# Patient Record
Sex: Female | Born: 1945 | Race: Black or African American | Hispanic: No | Marital: Married | Smoking: Never smoker
Health system: Southern US, Community
[De-identification: ages and names within clinical notes are randomized; demographics above are authoritative.]

## PROBLEM LIST (undated history)

## (undated) DIAGNOSIS — K648 Other hemorrhoids: Secondary | ICD-10-CM

## (undated) DIAGNOSIS — I639 Cerebral infarction, unspecified: Secondary | ICD-10-CM

## (undated) DIAGNOSIS — Z85038 Personal history of other malignant neoplasm of large intestine: Secondary | ICD-10-CM

## (undated) DIAGNOSIS — K5790 Diverticulosis of intestine, part unspecified, without perforation or abscess without bleeding: Secondary | ICD-10-CM

## (undated) DIAGNOSIS — M1990 Unspecified osteoarthritis, unspecified site: Secondary | ICD-10-CM

## (undated) DIAGNOSIS — I099 Rheumatic heart disease, unspecified: Secondary | ICD-10-CM

## (undated) DIAGNOSIS — I745 Embolism and thrombosis of iliac artery: Secondary | ICD-10-CM

## (undated) DIAGNOSIS — K625 Hemorrhage of anus and rectum: Secondary | ICD-10-CM

## (undated) DIAGNOSIS — R413 Other amnesia: Secondary | ICD-10-CM

## (undated) DIAGNOSIS — I4891 Unspecified atrial fibrillation: Secondary | ICD-10-CM

## (undated) DIAGNOSIS — I5022 Chronic systolic (congestive) heart failure: Secondary | ICD-10-CM

## (undated) DIAGNOSIS — I4901 Ventricular fibrillation: Secondary | ICD-10-CM

## (undated) DIAGNOSIS — R269 Unspecified abnormalities of gait and mobility: Secondary | ICD-10-CM

## (undated) DIAGNOSIS — I495 Sick sinus syndrome: Secondary | ICD-10-CM

## (undated) DIAGNOSIS — I82409 Acute embolism and thrombosis of unspecified deep veins of unspecified lower extremity: Secondary | ICD-10-CM

## (undated) DIAGNOSIS — I428 Other cardiomyopathies: Secondary | ICD-10-CM

## (undated) HISTORY — DX: Other hemorrhoids: K64.8

## (undated) HISTORY — DX: Personal history of other malignant neoplasm of large intestine: Z85.038

## (undated) HISTORY — DX: Other amnesia: R41.3

## (undated) HISTORY — PX: OTHER SURGICAL HISTORY: SHX169

## (undated) HISTORY — DX: Diverticulosis of intestine, part unspecified, without perforation or abscess without bleeding: K57.90

## (undated) HISTORY — PX: ABDOMINAL HYSTERECTOMY: SHX81

## (undated) HISTORY — DX: Acute embolism and thrombosis of unspecified deep veins of unspecified lower extremity: I82.409

## (undated) HISTORY — DX: Cerebral infarction, unspecified: I63.9

## (undated) HISTORY — DX: Unspecified abnormalities of gait and mobility: R26.9

## (undated) HISTORY — DX: Unspecified atrial fibrillation: I48.91

---

## 1981-08-26 HISTORY — PX: OTHER SURGICAL HISTORY: SHX169

## 1997-08-26 HISTORY — PX: COLON SURGERY: SHX602

## 1997-12-27 ENCOUNTER — Inpatient Hospital Stay (HOSPITAL_COMMUNITY): Admission: EM | Admit: 1997-12-27 | Discharge: 1997-12-31 | Payer: Self-pay | Admitting: Emergency Medicine

## 1998-01-12 ENCOUNTER — Ambulatory Visit (HOSPITAL_COMMUNITY): Admission: RE | Admit: 1998-01-12 | Discharge: 1998-01-12 | Payer: Self-pay | Admitting: Cardiology

## 1998-04-17 ENCOUNTER — Inpatient Hospital Stay (HOSPITAL_COMMUNITY): Admission: EM | Admit: 1998-04-17 | Discharge: 1998-05-03 | Payer: Self-pay | Admitting: *Deleted

## 1998-04-18 ENCOUNTER — Encounter: Payer: Self-pay | Admitting: Gastroenterology

## 1998-04-26 ENCOUNTER — Encounter: Payer: Self-pay | Admitting: Gastroenterology

## 1998-05-30 ENCOUNTER — Observation Stay (HOSPITAL_COMMUNITY): Admission: EM | Admit: 1998-05-30 | Discharge: 1998-05-31 | Payer: Self-pay | Admitting: *Deleted

## 1998-05-31 ENCOUNTER — Encounter: Payer: Self-pay | Admitting: Gastroenterology

## 1999-04-20 ENCOUNTER — Ambulatory Visit (HOSPITAL_COMMUNITY): Admission: RE | Admit: 1999-04-20 | Discharge: 1999-04-20 | Payer: Self-pay | Admitting: *Deleted

## 1999-08-21 ENCOUNTER — Encounter: Admission: RE | Admit: 1999-08-21 | Discharge: 1999-08-21 | Payer: Self-pay | Admitting: Gynecology

## 1999-10-25 ENCOUNTER — Encounter: Admission: RE | Admit: 1999-10-25 | Discharge: 1999-10-25 | Payer: Self-pay | Admitting: Gynecology

## 1999-10-25 ENCOUNTER — Encounter: Payer: Self-pay | Admitting: Gynecology

## 2000-04-29 ENCOUNTER — Encounter (INDEPENDENT_AMBULATORY_CARE_PROVIDER_SITE_OTHER): Payer: Self-pay

## 2000-04-29 ENCOUNTER — Encounter: Payer: Self-pay | Admitting: Gastroenterology

## 2000-04-29 ENCOUNTER — Encounter (INDEPENDENT_AMBULATORY_CARE_PROVIDER_SITE_OTHER): Payer: Self-pay | Admitting: *Deleted

## 2000-04-29 ENCOUNTER — Ambulatory Visit (HOSPITAL_COMMUNITY): Admission: RE | Admit: 2000-04-29 | Discharge: 2000-04-29 | Payer: Self-pay | Admitting: *Deleted

## 2000-11-24 ENCOUNTER — Encounter: Admission: RE | Admit: 2000-11-24 | Discharge: 2000-11-24 | Payer: Self-pay | Admitting: Gynecology

## 2000-11-24 ENCOUNTER — Encounter: Payer: Self-pay | Admitting: Gynecology

## 2001-06-08 ENCOUNTER — Ambulatory Visit (HOSPITAL_COMMUNITY): Admission: RE | Admit: 2001-06-08 | Discharge: 2001-06-08 | Payer: Self-pay | Admitting: Oncology

## 2001-06-08 ENCOUNTER — Encounter (HOSPITAL_COMMUNITY): Payer: Self-pay | Admitting: Oncology

## 2001-06-10 ENCOUNTER — Encounter (HOSPITAL_COMMUNITY): Payer: Self-pay | Admitting: Oncology

## 2001-06-10 ENCOUNTER — Ambulatory Visit (HOSPITAL_COMMUNITY): Admission: RE | Admit: 2001-06-10 | Discharge: 2001-06-10 | Payer: Self-pay | Admitting: Oncology

## 2001-06-17 ENCOUNTER — Other Ambulatory Visit: Admission: RE | Admit: 2001-06-17 | Discharge: 2001-06-17 | Payer: Self-pay | Admitting: Gynecology

## 2001-12-02 ENCOUNTER — Encounter: Admission: RE | Admit: 2001-12-02 | Discharge: 2001-12-02 | Payer: Self-pay | Admitting: Gynecology

## 2001-12-02 ENCOUNTER — Encounter: Payer: Self-pay | Admitting: Gynecology

## 2001-12-20 ENCOUNTER — Inpatient Hospital Stay (HOSPITAL_COMMUNITY): Admission: EM | Admit: 2001-12-20 | Discharge: 2002-01-05 | Payer: Self-pay

## 2001-12-20 ENCOUNTER — Encounter (INDEPENDENT_AMBULATORY_CARE_PROVIDER_SITE_OTHER): Payer: Self-pay | Admitting: Specialist

## 2001-12-23 ENCOUNTER — Encounter: Payer: Self-pay | Admitting: Cardiology

## 2002-04-19 ENCOUNTER — Encounter: Payer: Self-pay | Admitting: Gastroenterology

## 2002-04-19 ENCOUNTER — Ambulatory Visit (HOSPITAL_COMMUNITY): Admission: RE | Admit: 2002-04-19 | Discharge: 2002-04-19 | Payer: Self-pay | Admitting: *Deleted

## 2002-06-07 ENCOUNTER — Encounter (HOSPITAL_COMMUNITY): Payer: Self-pay | Admitting: Oncology

## 2002-06-07 ENCOUNTER — Ambulatory Visit (HOSPITAL_COMMUNITY): Admission: RE | Admit: 2002-06-07 | Discharge: 2002-06-07 | Payer: Self-pay | Admitting: Oncology

## 2002-12-10 ENCOUNTER — Encounter: Admission: RE | Admit: 2002-12-10 | Discharge: 2002-12-10 | Payer: Self-pay | Admitting: Gynecology

## 2002-12-10 ENCOUNTER — Encounter: Payer: Self-pay | Admitting: Gynecology

## 2003-06-06 ENCOUNTER — Ambulatory Visit (HOSPITAL_COMMUNITY): Admission: RE | Admit: 2003-06-06 | Discharge: 2003-06-06 | Payer: Self-pay | Admitting: Oncology

## 2003-06-06 ENCOUNTER — Encounter (HOSPITAL_COMMUNITY): Payer: Self-pay | Admitting: Oncology

## 2003-07-27 ENCOUNTER — Emergency Department (HOSPITAL_COMMUNITY): Admission: AD | Admit: 2003-07-27 | Discharge: 2003-07-27 | Payer: Self-pay | Admitting: Family Medicine

## 2003-07-29 ENCOUNTER — Encounter: Admission: RE | Admit: 2003-07-29 | Discharge: 2003-09-09 | Payer: Self-pay | Admitting: Family Medicine

## 2003-08-09 ENCOUNTER — Emergency Department (HOSPITAL_COMMUNITY): Admission: AD | Admit: 2003-08-09 | Discharge: 2003-08-09 | Payer: Self-pay | Admitting: Family Medicine

## 2003-10-04 ENCOUNTER — Ambulatory Visit (HOSPITAL_COMMUNITY): Admission: RE | Admit: 2003-10-04 | Discharge: 2003-10-04 | Payer: Self-pay | Admitting: Cardiology

## 2003-12-12 ENCOUNTER — Encounter: Admission: RE | Admit: 2003-12-12 | Discharge: 2003-12-12 | Payer: Self-pay | Admitting: Gynecology

## 2004-05-02 ENCOUNTER — Ambulatory Visit (HOSPITAL_COMMUNITY): Admission: RE | Admit: 2004-05-02 | Discharge: 2004-05-02 | Payer: Self-pay | Admitting: *Deleted

## 2004-05-02 ENCOUNTER — Encounter: Payer: Self-pay | Admitting: Gastroenterology

## 2004-05-02 ENCOUNTER — Encounter (INDEPENDENT_AMBULATORY_CARE_PROVIDER_SITE_OTHER): Payer: Self-pay | Admitting: *Deleted

## 2004-08-15 ENCOUNTER — Other Ambulatory Visit: Admission: RE | Admit: 2004-08-15 | Discharge: 2004-08-15 | Payer: Self-pay | Admitting: Gynecology

## 2004-10-03 ENCOUNTER — Ambulatory Visit: Payer: Self-pay | Admitting: Oncology

## 2004-10-04 ENCOUNTER — Ambulatory Visit (HOSPITAL_COMMUNITY): Admission: RE | Admit: 2004-10-04 | Discharge: 2004-10-04 | Payer: Self-pay | Admitting: Oncology

## 2005-02-13 ENCOUNTER — Encounter: Admission: RE | Admit: 2005-02-13 | Discharge: 2005-02-13 | Payer: Self-pay | Admitting: Gynecology

## 2005-09-12 ENCOUNTER — Emergency Department (HOSPITAL_COMMUNITY): Admission: EM | Admit: 2005-09-12 | Discharge: 2005-09-12 | Payer: Self-pay | Admitting: Family Medicine

## 2005-09-18 ENCOUNTER — Emergency Department (HOSPITAL_COMMUNITY): Admission: EM | Admit: 2005-09-18 | Discharge: 2005-09-18 | Payer: Self-pay | Admitting: Family Medicine

## 2005-10-02 ENCOUNTER — Ambulatory Visit: Payer: Self-pay | Admitting: Oncology

## 2005-10-03 ENCOUNTER — Ambulatory Visit (HOSPITAL_COMMUNITY): Admission: RE | Admit: 2005-10-03 | Discharge: 2005-10-03 | Payer: Self-pay | Admitting: Oncology

## 2006-02-14 ENCOUNTER — Encounter: Admission: RE | Admit: 2006-02-14 | Discharge: 2006-02-14 | Payer: Self-pay | Admitting: Gynecology

## 2006-03-13 ENCOUNTER — Encounter: Payer: Self-pay | Admitting: Internal Medicine

## 2006-03-13 ENCOUNTER — Ambulatory Visit (HOSPITAL_COMMUNITY): Admission: RE | Admit: 2006-03-13 | Discharge: 2006-03-13 | Payer: Self-pay | Admitting: Cardiology

## 2006-03-13 ENCOUNTER — Ambulatory Visit: Payer: Self-pay | Admitting: Internal Medicine

## 2006-09-30 ENCOUNTER — Ambulatory Visit: Payer: Self-pay | Admitting: Oncology

## 2006-10-02 LAB — COMPREHENSIVE METABOLIC PANEL
ALT: 15 U/L (ref 0–35)
AST: 17 U/L (ref 0–37)
Albumin: 4.7 g/dL (ref 3.5–5.2)
Alkaline Phosphatase: 66 U/L (ref 39–117)
BUN: 27 mg/dL — ABNORMAL HIGH (ref 6–23)
CO2: 24 mEq/L (ref 19–32)
Calcium: 9.6 mg/dL (ref 8.4–10.5)
Chloride: 101 mEq/L (ref 96–112)
Creatinine, Ser: 0.92 mg/dL (ref 0.40–1.20)
Glucose, Bld: 86 mg/dL (ref 70–99)
Potassium: 3.8 mEq/L (ref 3.5–5.3)
Sodium: 140 mEq/L (ref 135–145)
Total Bilirubin: 0.8 mg/dL (ref 0.3–1.2)
Total Protein: 7.6 g/dL (ref 6.0–8.3)

## 2006-10-02 LAB — CBC WITH DIFFERENTIAL/PLATELET
BASO%: 0.5 % (ref 0.0–2.0)
Basophils Absolute: 0 10*3/uL (ref 0.0–0.1)
EOS%: 0.8 % (ref 0.0–7.0)
Eosinophils Absolute: 0 10*3/uL (ref 0.0–0.5)
HCT: 44.2 % (ref 34.8–46.6)
HGB: 15.4 g/dL (ref 11.6–15.9)
LYMPH%: 37.5 % (ref 14.0–48.0)
MCH: 31.5 pg (ref 26.0–34.0)
MCHC: 34.8 g/dL (ref 32.0–36.0)
MCV: 90.5 fL (ref 81.0–101.0)
MONO#: 0.2 10*3/uL (ref 0.1–0.9)
MONO%: 6 % (ref 0.0–13.0)
NEUT#: 1.9 10*3/uL (ref 1.5–6.5)
NEUT%: 55.2 % (ref 39.6–76.8)
Platelets: 148 10*3/uL (ref 145–400)
RBC: 4.88 10*6/uL (ref 3.70–5.32)
RDW: 14.3 % (ref 11.3–14.5)
WBC: 3.4 10*3/uL — ABNORMAL LOW (ref 3.9–10.0)
lymph#: 1.3 10*3/uL (ref 0.9–3.3)

## 2006-10-02 LAB — LACTATE DEHYDROGENASE: LDH: 230 U/L (ref 94–250)

## 2007-02-18 ENCOUNTER — Encounter: Admission: RE | Admit: 2007-02-18 | Discharge: 2007-02-18 | Payer: Self-pay | Admitting: Gynecology

## 2007-07-09 ENCOUNTER — Other Ambulatory Visit: Admission: RE | Admit: 2007-07-09 | Discharge: 2007-07-09 | Payer: Self-pay | Admitting: Gynecology

## 2007-09-29 ENCOUNTER — Ambulatory Visit: Payer: Self-pay | Admitting: Oncology

## 2007-10-01 LAB — COMPREHENSIVE METABOLIC PANEL
ALT: 15 U/L (ref 0–35)
AST: 17 U/L (ref 0–37)
Albumin: 4.3 g/dL (ref 3.5–5.2)
Alkaline Phosphatase: 59 U/L (ref 39–117)
BUN: 17 mg/dL (ref 6–23)
CO2: 27 mEq/L (ref 19–32)
Calcium: 9.3 mg/dL (ref 8.4–10.5)
Chloride: 104 mEq/L (ref 96–112)
Creatinine, Ser: 0.9 mg/dL (ref 0.40–1.20)
Glucose, Bld: 113 mg/dL — ABNORMAL HIGH (ref 70–99)
Potassium: 3.9 mEq/L (ref 3.5–5.3)
Sodium: 143 mEq/L (ref 135–145)
Total Bilirubin: 1.2 mg/dL (ref 0.3–1.2)
Total Protein: 7.5 g/dL (ref 6.0–8.3)

## 2007-10-01 LAB — CBC WITH DIFFERENTIAL/PLATELET
BASO%: 1 % (ref 0.0–2.0)
Basophils Absolute: 0.1 10*3/uL (ref 0.0–0.1)
EOS%: 0.2 % (ref 0.0–7.0)
Eosinophils Absolute: 0 10*3/uL (ref 0.0–0.5)
HCT: 43.9 % (ref 34.8–46.6)
HGB: 14.9 g/dL (ref 11.6–15.9)
LYMPH%: 18.1 % (ref 14.0–48.0)
MCH: 30.5 pg (ref 26.0–34.0)
MCHC: 33.8 g/dL (ref 32.0–36.0)
MCV: 90.2 fL (ref 81.0–101.0)
MONO#: 0.5 10*3/uL (ref 0.1–0.9)
MONO%: 8.7 % (ref 0.0–13.0)
NEUT#: 3.9 10*3/uL (ref 1.5–6.5)
NEUT%: 72 % (ref 39.6–76.8)
Platelets: 157 10*3/uL (ref 145–400)
RBC: 4.87 10*6/uL (ref 3.70–5.32)
RDW: 15 % — ABNORMAL HIGH (ref 11.3–14.5)
WBC: 5.5 10*3/uL (ref 3.9–10.0)
lymph#: 1 10*3/uL (ref 0.9–3.3)

## 2007-10-01 LAB — LACTATE DEHYDROGENASE: LDH: 210 U/L (ref 94–250)

## 2007-10-05 ENCOUNTER — Encounter: Admission: RE | Admit: 2007-10-05 | Discharge: 2007-10-05 | Payer: Self-pay | Admitting: Internal Medicine

## 2008-01-24 ENCOUNTER — Ambulatory Visit: Payer: Self-pay | Admitting: Cardiology

## 2008-01-24 ENCOUNTER — Inpatient Hospital Stay (HOSPITAL_COMMUNITY): Admission: EM | Admit: 2008-01-24 | Discharge: 2008-02-17 | Payer: Self-pay | Admitting: Emergency Medicine

## 2008-01-24 ENCOUNTER — Ambulatory Visit: Payer: Self-pay | Admitting: Pulmonary Disease

## 2008-02-01 ENCOUNTER — Encounter (INDEPENDENT_AMBULATORY_CARE_PROVIDER_SITE_OTHER): Payer: Self-pay | Admitting: Cardiology

## 2008-03-03 ENCOUNTER — Ambulatory Visit: Payer: Self-pay

## 2008-03-07 ENCOUNTER — Ambulatory Visit: Payer: Self-pay | Admitting: Internal Medicine

## 2008-05-27 ENCOUNTER — Encounter: Admission: RE | Admit: 2008-05-27 | Discharge: 2008-05-27 | Payer: Self-pay | Admitting: Gynecology

## 2008-06-07 ENCOUNTER — Ambulatory Visit: Payer: Self-pay | Admitting: Internal Medicine

## 2008-09-05 ENCOUNTER — Ambulatory Visit: Payer: Self-pay | Admitting: Internal Medicine

## 2008-09-27 ENCOUNTER — Ambulatory Visit: Payer: Self-pay | Admitting: Oncology

## 2008-09-29 LAB — CBC WITH DIFFERENTIAL/PLATELET
BASO%: 0.3 % (ref 0.0–2.0)
Basophils Absolute: 0 10*3/uL (ref 0.0–0.1)
EOS%: 0.9 % (ref 0.0–7.0)
Eosinophils Absolute: 0 10*3/uL (ref 0.0–0.5)
HCT: 41.7 % (ref 34.8–46.6)
HGB: 14.1 g/dL (ref 11.6–15.9)
LYMPH%: 27.6 % (ref 14.0–48.0)
MCH: 30.3 pg (ref 26.0–34.0)
MCHC: 33.8 g/dL (ref 32.0–36.0)
MCV: 89.9 fL (ref 81.0–101.0)
MONO#: 0.3 10*3/uL (ref 0.1–0.9)
MONO%: 8.5 % (ref 0.0–13.0)
NEUT#: 1.9 10*3/uL (ref 1.5–6.5)
NEUT%: 62.7 % (ref 39.6–76.8)
Platelets: 134 10*3/uL — ABNORMAL LOW (ref 145–400)
RBC: 4.64 10*6/uL (ref 3.70–5.32)
RDW: 16.2 % — ABNORMAL HIGH (ref 11.3–14.5)
WBC: 3 10*3/uL — ABNORMAL LOW (ref 3.9–10.0)
lymph#: 0.8 10*3/uL — ABNORMAL LOW (ref 0.9–3.3)

## 2008-09-29 LAB — COMPREHENSIVE METABOLIC PANEL
ALT: 11 U/L (ref 0–35)
AST: 15 U/L (ref 0–37)
Albumin: 4.3 g/dL (ref 3.5–5.2)
Alkaline Phosphatase: 71 U/L (ref 39–117)
BUN: 19 mg/dL (ref 6–23)
CO2: 27 mEq/L (ref 19–32)
Calcium: 9.2 mg/dL (ref 8.4–10.5)
Chloride: 104 mEq/L (ref 96–112)
Creatinine, Ser: 0.76 mg/dL (ref 0.40–1.20)
Glucose, Bld: 66 mg/dL — ABNORMAL LOW (ref 70–99)
Potassium: 3.8 mEq/L (ref 3.5–5.3)
Sodium: 139 mEq/L (ref 135–145)
Total Bilirubin: 0.7 mg/dL (ref 0.3–1.2)
Total Protein: 7.2 g/dL (ref 6.0–8.3)

## 2008-09-29 LAB — LACTATE DEHYDROGENASE: LDH: 196 U/L (ref 94–250)

## 2008-10-06 ENCOUNTER — Encounter: Payer: Self-pay | Admitting: Internal Medicine

## 2008-12-05 ENCOUNTER — Ambulatory Visit: Payer: Self-pay | Admitting: Internal Medicine

## 2009-03-06 ENCOUNTER — Encounter: Payer: Self-pay | Admitting: Internal Medicine

## 2009-03-09 ENCOUNTER — Encounter: Payer: Self-pay | Admitting: Internal Medicine

## 2009-03-13 ENCOUNTER — Ambulatory Visit: Payer: Self-pay | Admitting: Internal Medicine

## 2009-05-11 ENCOUNTER — Ambulatory Visit: Payer: Self-pay | Admitting: Gastroenterology

## 2009-05-17 ENCOUNTER — Telehealth: Payer: Self-pay | Admitting: Gastroenterology

## 2009-05-23 ENCOUNTER — Ambulatory Visit: Payer: Self-pay | Admitting: Gastroenterology

## 2009-05-23 ENCOUNTER — Encounter: Payer: Self-pay | Admitting: Gastroenterology

## 2009-05-24 ENCOUNTER — Encounter: Payer: Self-pay | Admitting: Gastroenterology

## 2009-05-31 ENCOUNTER — Encounter: Admission: RE | Admit: 2009-05-31 | Discharge: 2009-05-31 | Payer: Self-pay | Admitting: Internal Medicine

## 2009-06-05 DIAGNOSIS — K5730 Diverticulosis of large intestine without perforation or abscess without bleeding: Secondary | ICD-10-CM | POA: Insufficient documentation

## 2009-06-06 ENCOUNTER — Ambulatory Visit: Payer: Self-pay | Admitting: Internal Medicine

## 2009-09-03 ENCOUNTER — Encounter: Payer: Self-pay | Admitting: Internal Medicine

## 2009-09-04 ENCOUNTER — Ambulatory Visit: Payer: Self-pay | Admitting: Internal Medicine

## 2009-09-12 ENCOUNTER — Encounter: Payer: Self-pay | Admitting: Internal Medicine

## 2009-09-27 ENCOUNTER — Telehealth (INDEPENDENT_AMBULATORY_CARE_PROVIDER_SITE_OTHER): Payer: Self-pay | Admitting: *Deleted

## 2009-12-03 ENCOUNTER — Encounter: Payer: Self-pay | Admitting: Internal Medicine

## 2009-12-04 ENCOUNTER — Ambulatory Visit: Payer: Self-pay | Admitting: Internal Medicine

## 2009-12-13 ENCOUNTER — Encounter: Payer: Self-pay | Admitting: Internal Medicine

## 2010-03-14 ENCOUNTER — Ambulatory Visit: Payer: Self-pay | Admitting: Internal Medicine

## 2010-06-04 ENCOUNTER — Encounter: Admission: RE | Admit: 2010-06-04 | Discharge: 2010-06-04 | Payer: Self-pay | Admitting: Internal Medicine

## 2010-06-15 ENCOUNTER — Ambulatory Visit: Payer: Self-pay | Admitting: Internal Medicine

## 2010-09-13 ENCOUNTER — Encounter: Payer: Self-pay | Admitting: Internal Medicine

## 2010-09-13 ENCOUNTER — Ambulatory Visit
Admission: RE | Admit: 2010-09-13 | Discharge: 2010-09-13 | Payer: Self-pay | Source: Home / Self Care | Attending: Internal Medicine | Admitting: Internal Medicine

## 2010-09-25 NOTE — Cardiovascular Report (Signed)
Summary: Office Visit Remote  Office Visit Remote   Imported By: Roderic Ovens 03/27/2009 16:13:56  _____________________________________________________________________  External Attachment:    Type:   Image     Comment:   External Document

## 2010-09-25 NOTE — Assessment & Plan Note (Signed)
Summary: CONSULT COLON/ON COUMADIN/FH   History of Present Illness Visit Type: new patient  Primary GI MD: Elie Goody MD [facility] Primary Provider: Burton Apley, MD  Requesting Provider: n/a Chief Complaint: Consult colon. Pt is on coumadin and had colon cancer. Pt denies any GI complaints. History of Present Illness:   This is a 65 year old female here today with her husband. Her husband provides the majority of the history from the patient. She is a former patient of Dr. Sabino Gasser. She has a history of T3, N1 colon cancer, diagnosed in 1999, and is status post right hemicolectomy and chemotherapy. She was previously followed by Dr. Arline Asp and has been released.  She is maintained on Coumadin with afib and status post mitral valve replacement. She has a history of afib and VF arrest and bradycardia. She has a biventricular implantable cardioverter defibrillator. Her cardiovascular problems appear stable at this point. She has no gastrointestinal complaints. Her last colonoscopy was performed 5 years ago by Dr. Virginia Rochester.   GI Review of Systems      Denies abdominal pain, acid reflux, belching, bloating, chest pain, dysphagia with liquids, dysphagia with solids, heartburn, loss of appetite, nausea, vomiting, vomiting blood, weight loss, and  weight gain.        Denies anal fissure, black tarry stools, change in bowel habit, constipation, diarrhea, diverticulosis, fecal incontinence, heme positive stool, hemorrhoids, irritable bowel syndrome, jaundice, light color stool, liver problems, rectal bleeding, and  rectal pain.   Current Medications (verified): 1)  Digoxin 0.125 Mg Tabs (Digoxin) .[name].. 1 Tablet By Mouth Once Daily 2)  Coumadin 5 Mg Tabs (Warfarin Sodium) .[name].. 1 Tablet By Mouth As Directed As Needed 3)  Coumadin 1 Mg Tabs (Warfarin Sodium) .[name].. 1 Tablet By Mouth As Directed 4)  Lopressor 1 Mg/ml Soln (Metoprolol Tartrate) .[name].. 1 Tablet By Mouth Two Times A Day 5)  Robaxin-750 750  Mg Tabs (Methocarbamol) .[name].. 1 Tablet By Mouth Three Times A Day 6)  Meloxicam 7.5 Mg Tabs (Meloxicam) .[name].. 1 Tablet By Mouth Once Daily 7)  Carvedilol 12.5 Mg Tabs (Carvedilol) .[name].. 1 Tablet By Mouth Two Times A Day 8)  Protonix 40 Mg Tbec (Pantoprazole Sodium) .... Take As Needed 9)  Klor-Con 10 10 Meq Cr-Tabs (Potassium Chloride) .[name].. 1 Tablet By Mouth Once Daily 10)  Trental 400 Mg Cr-Tabs (Pentoxifylline) .[name].. 1 Tablet By Mouth Two Times A Day  Allergies (verified): No Known Drug Allergies  Past History:  Past Medical History: T3, N1 Colon Cancer, 1999, chemotherapy Diverticulosis Internal Hemorrhoids Atrial fibrillation VF arrest Nonischemic cardiomyopathy Anoxic encephalopathy post arrest  Past Surgical History: Mitral Valve Replacement, Bjork-Shiley valve 1983 Biventricular AICD RIght hemicolectomy 1999   Family History: No FH of Colon Cancer:  Social History: Occupation: Unemployed  Married One child  Patient has never smoked.  Alcohol Use - no Daily Caffeine Use: Coffee Occ Illicit Drug Use - no Patient does not get regular exercise.  Smoking Status:  never Drug Use:  no Does Patient Exercise:  no  Review of Systems       The pertinent positives and negatives are noted as above and in the HPI. All other ROS were reviewed and were negative.   Vital Signs:  Patient profile:   65 year old female Height:      67 inches Weight:      134 pounds BMI:     21.06 BSA:     1.71 Pulse rate:   60 / minute Pulse rhythm:  regular BP sitting:   98 / 64  (left arm) Cuff size:   regular  Vitals Entered By: Ok Anis CMA (May 11, 2009 8:59 AM)  Physical Exam  General:  Well developed, well nourished, no acute distress. Head:  Normocephalic and atraumatic. Eyes:  PERRLA, no icterus. Ears:  Normal auditory acuity. Mouth:  No deformity or lesions, dentition normal. Neck:  Supple; no masses or thyromegaly. Lungs:  Clear throughout to  auscultation. Heart:  Irregular rate and rhythm; no murmurs, rubs,  or bruits. prosthetic valve sounds Abdomen:  Soft, nontender and nondistended. No masses, hepatosplenomegaly or hernias noted. Normal bowel sounds. Rectal:  deferred until time of colonoscopy.   Msk:  Symmetrical with no gross deformities. Normal posture. Pulses:  Normal pulses noted. Extremities:  No clubbing, cyanosis, edema or deformities noted. Neurologic:  Alert and  oriented x 2;  unsteady gait. Cervical Nodes:  No significant cervical adenopathy. Inguinal Nodes:  No significant inguinal adenopathy. Psych:  Alert and cooperative. poor concentration and poor memory.     Impression & Recommendations:  Problem # 1:  CARCINOMA, COLON, HX OF (ICD-V10.05) History of colon cancer 1999. She is due for her five-year surveillance. No ongoing colorectal complaints. The risks, benefits and alternatives to colonoscopy with possible biopsy and possible polypectomy were discussed with the patient and her husband and they consent to proceed. The procedure will be scheduled electively. The risks, benefits, and alternatives to 5 day hold Coumadin anticoagulation were discussed with the patient and her husband. Will obtain advice from Dr. Sharrell Ku regarding anticoagulation management for colonoscopy. Also, if a electrocautery is required her defibrillator will need to be temporarily inactivated. Orders: Colonoscopy (Colon)  Problem # 2:  ENCOUNTER FOR LONG-TERM USE OF ANTICOAGULANTS (ICD-V58.61) Chronic atrial fibrillation and mitral valve replacement requiring Coumadin. As an problem #1  Problem # 3:  IMPLANTATION OF DEFIBRILLATOR, HX OF (ICD-V45.02) As in problem #1.  Patient Instructions: 1)  Colonoscopy [facility] 05/23/09 11:30 am 2)  Movi prep instructions given to patient. 3)  Movi prep Rx. sent to pharmacy. 4)  Hold Coumadin x 5 days  5)  Letter sent to Dr. Sharrell Ku Cardiologist for management advice. 6)  Copy sent to : Sharrell Ku, MD    Burton Apley, MD 7)  The medication list was reviewed and reconciled.  All changed / newly prescribed medications were explained.  A complete medication list was provided to the patient / caregiver.  Prescriptions: MOVIPREP 100 GM  SOLR (PEG-KCL-NACL-NASULF-NA ASC-C) As per prep instructions.  #1 x 0   Entered by:   Hortense Ramal CMA (AAMA)   Authorized by:   Meryl Dare MD [facility]   Signed by:   Meryl Dare MD FACG on 05/11/2009   Method used:   Historical   RxID:   [id number] MOVIPREP 100 GM  SOLR (PEG-KCL-NACL-NASULF-NA ASC-C) As per prep instructions.  #1 x 0   Entered by:   Milford Cage NCMA   Authorized by:   Meryl Dare MD [facility]   Signed by:   Milford Cage NCMA on 05/11/2009   Method used:   Printed then faxed to ...       [facility] (retail)       [address]       [address]       [place], [place]  [address]       Ph: [phone]       Fax: [phone]   RxID:   [id number]

## 2010-09-25 NOTE — Letter (Signed)
Summary: Remote Device Check  [facility] [employer], Main Office  [address]   [place], [place] [address]   Phone: [phone]  Fax: [phone]     March 09, 2009 MRN: [id number]   Angela [name] [address] [place], [place]  [address]   Dear Ms. Malanowski,   Your remote transmission was recieved and reviewed by your physician.  All diagnostics were within normal limits for you.    ___X___Your next office visit is scheduled for:   October 2010 with Dr Ladona Ridgel. Please call our office to schedule an appointment.    Sincerely,  Proofreader

## 2010-09-25 NOTE — Assessment & Plan Note (Signed)
Summary: pc2 sl   Referring Provider:  n/a Primary Provider:  Burton Apley, MD   CC:  Device Check.  History of Present Illness: Ms. Angela [name] returns today for followup.  She is a very pleasant middle- aged woman with a nonischemic cardiomyopathy, congestive heart failure status post VF arrest, history of bradycardia status post pacemaker insertion, who was ultimately resuscitated and underwent insertion of a biventricular ICD back in June 2009.  She returns today for followup.  She continues to improve.  Her dizziness has been better. Her gait instability is better. She is walking some.  She denies chest pain  or shortness of breath.  No intercurrent ICD shocks.  Current Medications (verified): 1)  Digoxin 0.125 Mg Tabs (Digoxin) .[name].. 1 Tablet By Mouth Once Daily 2)  Coumadin 5 Mg Tabs (Warfarin Sodium) .[name].. 1 Tablet By Mouth As Directed As Needed 3)  Coumadin 1 Mg Tabs (Warfarin Sodium) .[name].. 1 Tablet By Mouth As Directed 4)  Robaxin-750 750 Mg Tabs (Methocarbamol) .[name].. 1 Tablet By Mouth Three Times A Day 5)  Carvedilol 12.5 Mg Tabs (Carvedilol) .[name].. 1 Tablet By Mouth Two Times A Day 6)  Furosemide 40 Mg Tabs (Furosemide) .... Take One Tablet By Mouth Daily. 7)  Klor-Con 10 10 Meq Cr-Tabs (Potassium Chloride) .[name].. 1 Tablet By Mouth Once Daily 8)  Moviprep 100 Gm  Solr (Peg-Kcl-Nacl-Nasulf-Na Asc-C) .... As Per Prep Instructions.  Allergies (verified): No Known Drug Allergies lllllllllllllllllllllllllllllllllllllllllllllllllllllllllllllllllllllllllllllllllllllllllllllllllllllllllllllllllllllllllllllllllllllllllllllllllllllllllllllllllllllllllllllllllllllllllllllllllllllllllllllllllllllllllllllllllllllllllllllllllllllllllllllllllllllllllllllllllllllllllllllllllllllllllllllllll   Past History:  Past Medical History: Last updated: 06/05/2009 Current Problems:  CARDIOMYOPATHY (ICD-425.4) ATRIAL FIBRILLATION (ICD-427.31) DIVERTICULAR DISEASE (ICD-562.10) IMPLANTATION OF  DEFIBRILLATOR, HX OF (ICD-V45.02) ENCOUNTER FOR LONG-TERM USE OF ANTICOAGULANTS (ICD-V58.61) CARCINOMA, COLON, HX OF (ICD-V10.05) T3, N1 Colon Cancer, 1999, chemotherapy Internal Hemorrhoids VF arrest Anoxic encephalopathy post arrest  Past Surgical History: Last updated: 05/11/2009 Mitral Valve Replacement, Bjork-Shiley valve 1983 Biventricular AICD RIght hemicolectomy 1999   Review of Systems  The patient denies chest pain, syncope, dyspnea on exertion, and peripheral edema.    Vital Signs:  Patient profile:   65 year old female Height:      67 inches Weight:      136.50 pounds BMI:     21.46 Pulse rate:   68 / minute Pulse rhythm:   irregular BP sitting:   96 / 50  (left arm) Cuff size:   regular  Vitals Entered By: Stanton Kidney, EMT-P (June 06, 2009 3:49 PM)  Physical Exam  General:  65 yo woman who looks younger than her stated age. Head:  normocephalic and atraumatic Eyes:  PERRLA/EOM intact; conjunctiva and lids normal. Mouth:  Teeth, gums and palate normal. Oral mucosa normal. Neck:  Neck supple, no JVD. No masses, thyromegaly or abnormal cervical nodes. Chest Wall:  Well healed ICD incision. Lungs:  Clear bilaterally with no wheezes, rales, or rhonchi.  No increased work of breathing. Heart:  RRR with mechanical S1 and S2.  PMI is enalarged and laterally displaced.  No murmur. Abdomen:  Bowel sounds positive; abdomen soft and non-tender without masses, organomegaly, or hernias noted. No hepatosplenomegaly. Msk:  Back normal, normal gait. Muscle strength and tone normal. Pulses:  pulses normal in all 4 extremities Extremities:  No clubbing or cyanosis. No edema. Neurologic:  Alert and oriented x 3.    ICD Specifications Following MD:  Lewayne Bunting, MD     ICD Vendor:  [facility] [employer]     ICD Model Number:  [phone]     ICD Serial Number:  [id number] ICD DOI:  02/11/2008     ICD Implanting MD:  Lewayne Bunting, MD  Lead 1:    Location: RV     DOI: 02/11/2008     Model #:  7120     Serial #: [id number]     Status: active Lead 2:    Location: LV     DOI: 02/11/2008     Model #: 1158T     Serial #: [id number]     Status: active  Indications::  VF ARREST, CHF   ICD Follow Up Remote Check?  No Battery Voltage:  3.17 V     Charge Time:  10.9 seconds     Battery Est. Longevity:  5.2 years   ICD Device Measurements Atrium:  Amplitude: 6.2 mV, Impedance: 480 ohms, Threshold: 0.75 V at 0.5 msec Right Ventricle:  Amplitude: 6.6 mV, Impedance: 330 ohms, Threshold: 1.25 V at 0.6 msec Shock Impedance: 43 ohms   Episodes Ventricular Pacing:  30%  Brady Parameters Mode VVIR     Lower Rate Limit:  60     Upper Rate Limit 120  Tachy Zones VF:  222     VT:  173     Next Remote Date:  09/04/2009     Next Cardiology Appt Due:  05/26/2010 Tech Comments:  Checked by industry MD Comments:  Normal ICD function.  We reprogrammed her device today to provide for ATP therapies by adding a VT zone.  Impression & Recommendations:  Problem # 1:  IMPLANTATION OF DEFIBRILLATOR, HX OF (ICD-V45.02) Her device is working normally.  Will recheck in several months.  Problem # 2:  ATRIAL FIBRILLATION (ICD-427.31)  She remains asymptomatic in chronic atrial fibrillation. The following medications were removed from the medication list:    Lopressor 1 Mg/ml Soln (Metoprolol tartrate) .[name]... 1 tablet by mouth two times a day Her updated medication list for this problem includes:    Digoxin 0.125 Mg Tabs (Digoxin) .[name]... 1 tablet by mouth once daily    Coumadin 5 Mg Tabs (Warfarin sodium) .[name]... 1 tablet by mouth as directed as needed    Coumadin 1 Mg Tabs (Warfarin sodium) .[name]... 1 tablet by mouth as directed    Carvedilol 12.5 Mg Tabs (Carvedilol) .[name]... 1 tablet by mouth two times a day  The following medications were removed from the medication list:    Lopressor 1 Mg/ml Soln (Metoprolol tartrate) .[name]... 1 tablet by mouth two times a day Her updated medication list for this problem  includes:    Digoxin 0.125 Mg Tabs (Digoxin) .[name]... 1 tablet by mouth once daily    Coumadin 5 Mg Tabs (Warfarin sodium) .[name]... 1 tablet by mouth as directed as needed    Coumadin 1 Mg Tabs (Warfarin sodium) .[name]... 1 tablet by mouth as directed    Carvedilol 12.5 Mg Tabs (Carvedilol) .[name]... 1 tablet by mouth two times a day  Problem # 3:  CHRONIC SYSTOLIC HEART FAILURE (ICD-428.22) Her CHF symptoms remain class 2.  A low sodium diet is recommended.  Her heart rates have been elevated some and I would like to increase her AV nodal blocking drugs but her low blood pressure makes this prohibitive. The following medications were removed from the medication list:    Lopressor 1 Mg/ml Soln (Metoprolol tartrate) .[name]... 1 tablet by mouth two times a day Her updated medication list for this problem includes:    Digoxin 0.125 Mg Tabs (Digoxin) .[name]... 1 tablet by mouth once daily    Coumadin 5 Mg Tabs (Warfarin  sodium) .[name]... 1 tablet by mouth as directed as needed    Coumadin 1 Mg Tabs (Warfarin sodium) .[name]... 1 tablet by mouth as directed    Carvedilol 12.5 Mg Tabs (Carvedilol) .[name]... 1 tablet by mouth two times a day    Furosemide 40 Mg Tabs (Furosemide) .[name]... Take one tablet by mouth daily.  The following medications were removed from the medication list:    Lopressor 1 Mg/ml Soln (Metoprolol tartrate) .[name]... 1 tablet by mouth two times a day Her updated medication list for this problem includes:    Digoxin 0.125 Mg Tabs (Digoxin) .[name]... 1 tablet by mouth once daily    Coumadin 5 Mg Tabs (Warfarin sodium) .[name]... 1 tablet by mouth as directed as needed    Coumadin 1 Mg Tabs (Warfarin sodium) .[name]... 1 tablet by mouth as directed    Carvedilol 12.5 Mg Tabs (Carvedilol) .[name]... 1 tablet by mouth two times a day    Furosemide 40 Mg Tabs (Furosemide) .[name]... Take one tablet by mouth daily.  Patient Instructions: 1)  Your physician recommends that you schedule a follow-up appointment in: 3 months

## 2010-09-25 NOTE — Procedures (Signed)
Summary: Colonoscopy   Colonoscopy  Procedure date:  05/23/2009  Findings:      Location:  [facility].    Procedures Next Due Date:    Colonoscopy: 05/2014 COLONOSCOPY PROCEDURE REPORT  PATIENT:  Angela [name], Angela [name]  MR#:  [id number] BIRTHDATE:   Apr 28, 1946, 63 yrs. old   GENDER:   female  ENDOSCOPIST:   Judie Petit T. Russella Dar, MD, [facility]    PROCEDURE DATE:  05/23/2009 PROCEDURE:  Colonoscopy with snare polypectomy ASA CLASS:   Class III INDICATIONS: 1) follow-up of colon cancer, T3, N1, 1999.  MEDICATIONS:    Fentanyl 50 mcg IV, Versed 6 mg IV  DESCRIPTION OF PROCEDURE:   After the risks benefits and alternatives of the procedure were thoroughly explained, informed consent was obtained.  Digital rectal exam was performed and revealed no abnormalities.   The LB [id number]-[id number] endoscope was introduced through the anus and advanced to the terminal ileum which was intubated for a short distance, without limitations.  The quality of the prep was good, using MoviPrep.  The instrument was then slowly withdrawn as the colon was fully examined. <<PROCEDUREIMAGES>>          <<OLD IMAGES>>  FINDINGS:  Moderately severe diverticulosis was found sigmoid to ascending colon.  The right colon was surgically resected and an ileo-colonic anastamosis was seen.  A sessile polyp was found in the descending colon. It was 4 mm in size. Polyp was snared without cautery. Retrieval was successful.  This was otherwise a normal examination of the colon.  Retroflexed views in the rectum revealed internal hemorrhoids, small.  The time to cecum =  4  minutes. The scope was then withdrawn (time =  8.75  min) from the patient and the procedure completed.  COMPLICATIONS:   None   ENDOSCOPIC IMPRESSION:  1) Moderately severe diverticulosis in the sigmoid to ascending  2) Prior right hemi-colectomy  3) 4 mm sessile polyp in the descending colon  4) Internal hemorrhoids  RECOMMENDATIONS:  1) await  pathology results  2) Resume Coumadin (warfarin) today   3) high fiber diet  4) colonoscopy in 5 years     Malcolm T. Russella Dar, MD, [facility]    CC: Burton Apley, MD      REPORT OF SURGICAL PATHOLOGY   Case #: [id number] Patient Name: [name], [name] Office Chart Number:  N/A [id number] MRN: [id number] Pathologist: Alden Server A. Delila Spence, MD DOB/Age  [birth year] (Age: 12)    Gender: F Date Taken:  05/23/2009 Date Received: 05/23/2009   FINAL DIAGNOSIS   ***MICROSCOPIC EXAMINATION AND DIAGNOSIS***   COLON, DESCENDING, BIOPSY:   -  HYPERPLASTIC POLYP. -  NO ADENOMATOUS CHANGE OR MALIGNANCY IDENTIFIED.    mw Date Reported:  05/24/2009     Alden Server A. Delila Spence, MD    May 24, 2009 MRN: [id number]    [facility] [address] [place], [place]  [address]    Dear Ms. Eimers,  I am pleased to inform you that the colon polyp(s) removed during your recent colonoscopy was (were) found to be hyperplastic. These types of polyps are NOT pre-cancerous.  It is my recommendation that you have a repeat colonoscopy examination in 5 years for routine colorectal cancer screening.  Should you develop new or worsening symptoms of abdominal pain, bowel habit changes or bleeding from the rectum or bowels, please schedule an evaluation with either your primary care physician or with me.  Continue treatment plan as outlined the day of your exam.  Please call us if you are  having persistent problems or have questions about your condition that have not been fully answered at this time.  Sincerely,  Meryl Dare MD [facility]  This letter has been electronically signed by your physician.   This report was created from the original endoscopy report, which was reviewed and signed by the above listed endoscopist.

## 2010-09-25 NOTE — Cardiovascular Report (Signed)
Summary: Office Visit Remote   Office Visit Remote   Imported By: Roderic Ovens 09/13/2009 12:37:29  _____________________________________________________________________  External Attachment:    Type:   Image     Comment:   External Document

## 2010-09-25 NOTE — Op Note (Signed)
Summary: operative report-Dr. Virginia Rochester  NAME:  Angela [name], Angela [name]                          ACCOUNT NO.:  [id number]   MEDICAL RECORD NO.:  [id number]                   PATIENT TYPE:  AMB   LOCATION:  ENDO                                 FACILITY[facility]   PHYSICIAN:  Georgiana Spinner, M.D.                 DATE OF BIRTH:  [birth year]   DATE OF PROCEDURE:  05/02/2004  DATE OF DISCHARGE:                                 OPERATIVE REPORT   PROCEDURE:  Colonoscopy.   INDICATIONS:  Colon cancer.   ANESTHESIA:  Demerol 40 mg, Versed 4 mg.   PROCEDURE:  With the patient mildly sedated in the left lateral decubitus  position, the Olympus videoscopic colonoscope was inserted in the rectum and  passed under direct vision to the neo-cecum, identified by surgical  anastomosis, which was photographed.  From this point the colonoscope was  slowly withdrawn, taking circumferential views of the colonic mucosa,  suctioning fecal debris as we went until we reached the rectum, which  appeared normal on direct and showed hemorrhoids on retroflex view.  The  endoscope was straightened and withdrawn.  The patient's vital signs and  pulse oximetry remained stable.  The patient tolerated the procedure well  without apparent complications.   FINDINGS:  Diverticulosis of the sigmoid colon, internal hemorrhoids,  otherwise an unremarkable examination to the neo-cecum.   PLAN:  Repeat examination in five years.                                               Georgiana Spinner, M.D.    GMO/MEDQ  D:  05/02/2004  T:  05/02/2004  Job:  [id number]

## 2010-09-25 NOTE — Procedures (Signed)
Summary: Colonoscopy/[facility]  Colonoscopy/[facility]   Imported By: Lester [place] 04/27/2009 08:31:19  _____________________________________________________________________  External Attachment:    Type:   Image     Comment:   External Document

## 2010-09-25 NOTE — Letter (Signed)
Summary: Remote Device Check  [employer], Main Office  [address]   [place], [place] [address]   Phone: [phone]  Fax: [phone]     September 12, 2009 MRN: [id number]   GIABELLA DUHART [address] [place], [place]  [address]   Dear Ms. Lanni,   Your remote transmission was recieved and reviewed by your physician.  All diagnostics were within normal limits for you.  __X___Your next transmission is scheduled for:    December 04, 2009.  Please transmit at any time this day.  If you have a wireless device your transmission will be sent automatically.      Sincerely,  Proofreader

## 2010-09-25 NOTE — Procedures (Signed)
Summary: Colon w polypectomy/[facility]  Colon w polypectomy/[facility]   Imported By: Lester [place] 04/27/2009 08:28:37  _____________________________________________________________________  External Attachment:    Type:   Image     Comment:   External Document

## 2010-09-25 NOTE — Miscellaneous (Signed)
Summary: Colonoscopy with polypectomy/biopsy/Dr. Debbra Riding [facility]  Patient:    Angela [name], Angela [name]                       MRN: [id number] Proc. Date: 04/29/00 Adm. Date:  [id number] Attending:  Sabino Gasser                           Procedure Report  PROCEDURE:  Colonoscopy with polypectomy and biopsy.  INDICATION FOR PROCEDURE:  Colon cancer.  ANESTHESIA:  Demerol 50 mg, Versed 6 mg was given intravenously, preoperative ampicillin 2 gm and gentamycin 60 mg and because of the patients heart valve, the patient was on Coumadin.  DESCRIPTION OF PROCEDURE:  With the patient mildly sedated in the left lateral decubitus position subsequently on her back and then finally rolled to the right lateral decubitus position, the Olympus videoscopic pediatric colonoscope was inserted into the rectum and passed under direct vision into the neo-cecum through a tortuous colon. In the neo-cecum, a small polyp was seen, photographed and using hot biopsy forceps technique on a setting of 3:3 blended current, it was removed. There was good hemostasis. The endoscope was then withdrawn taking circumferential views of the entire colonic mucosa, stopping photograph at approximately 35 cm from the anal verge a questionable fold versus a new polyp. This was biopsied using biopsy technique, one bite taken. Again good hemostasis was noted. The endoscope was withdrawn to the rectum which appeared normal on direct and retroflexed view showed internal hemorrhoids. The patients vital signs and pulse oximeter remained stable. The patient tolerated the procedure well without apparent complications.  FINDINGS:  Polyp of neo-cecum removed by hot biopsy forceps technique. A question of a polyp at 35 cm versus a prominent fold biopsied at this point. Await biopsy report. The patient will call me for results and follow-up with me as an outpatient. Will check a protime and H&H at this time and  treat according to results. DD:  04/29/00 TD:  04/29/00 Job: [id number]

## 2010-09-25 NOTE — Assessment & Plan Note (Signed)
Summary: DF2 PT WANTED AM APPT/SL      Allergies Added: NKDA  Visit Type:  Follow-up Referring Provider:  n/a Primary Provider:  Burton Apley, MD   CC:  Device check.  History of Present Illness: Ms. Shane returns today for followup.  She is a very pleasant middle- aged woman with a nonischemic cardiomyopathy, congestive heart failure status post VF arrest, history of bradycardia status post pacemaker insertion, who was ultimately resuscitated and underwent insertion of a biventricular ICD back in June 2009.  She returns today for followup.  She continues to improve.  Her dizziness has been better. Her gait instability is better.  She denies chest pain  or shortness of breath.  No intercurrent ICD shocks.  She does not feel much in the way of palpitations.  Current Medications (verified): 1)  Coumadin 5 Mg Tabs (Warfarin Sodium) .[name].. 1 Tablet By Mouth As Directed As Needed 2)  Coumadin 1 Mg Tabs (Warfarin Sodium) .[name].. 1 Tablet By Mouth As Directed 3)  Carvedilol 12.5 Mg Tabs (Carvedilol) .[name].. 1 Tablet By Mouth Two Times A Day 4)  Furosemide 40 Mg Tabs (Furosemide) .... Take One Tablet By Mouth Daily. 5)  Klor-Con 10 10 Meq Cr-Tabs (Potassium Chloride) .[name].. 1 Tablet By Mouth Once Daily 6)  Digoxin 0.125 Mg Tabs (Digoxin) .... Take A Half  Tablet By Mouth Daily  Allergies (verified): No Known Drug Allergies  Past History:  Past Medical History: Last updated: 06/05/2009 Current Problems:  CARDIOMYOPATHY (ICD-425.4) ATRIAL FIBRILLATION (ICD-427.31) DIVERTICULAR DISEASE (ICD-562.10) IMPLANTATION OF DEFIBRILLATOR, HX OF (ICD-V45.02) ENCOUNTER FOR LONG-TERM USE OF ANTICOAGULANTS (ICD-V58.61) CARCINOMA, COLON, HX OF (ICD-V10.05) T3, N1 Colon Cancer, 1999, chemotherapy Internal Hemorrhoids VF arrest Anoxic encephalopathy post arrest  Past Surgical History: Last updated: 05/11/2009 Mitral Valve Replacement, Bjork-Shiley valve 1983 Biventricular AICD RIght hemicolectomy 1999    Review of Systems  The patient denies chest pain, syncope, dyspnea on exertion, and peripheral edema.    Vital Signs:  Patient profile:   66 year old female Height:      67 inches Weight:      134.50 pounds BMI:     21.14 Pulse rate:   80 / minute Pulse rhythm:   irregular Resp:     18 per minute BP sitting:   134 / 74  (left arm) Cuff size:   regular  Vitals Entered By: Vikki Ports (June 15, 2010 9:51 AM)  Physical Exam  General:  65 yo woman who looks younger than her stated age. Head:  normocephalic and atraumatic Mouth:  Teeth, gums and palate normal. Oral mucosa normal. Neck:  Neck supple, no JVD. No masses, thyromegaly or abnormal cervical nodes. Chest Wall:  Well healed ICD incision. Lungs:  Clear bilaterally with no wheezes, rales, or rhonchi.  No increased work of breathing. Heart:  RRR with mechanical S1 and S2.  PMI is enalarged and laterally displaced.  No murmur. Abdomen:  Bowel sounds positive; abdomen soft and non-tender without masses, organomegaly, or hernias noted. No hepatosplenomegaly. Msk:  Back normal, normal gait. Muscle strength and tone normal. Pulses:  pulses normal in all 4 extremities Extremities:  No clubbing or cyanosis. No edema. Neurologic:  Alert and oriented x 3.    ICD Specifications Following MD:  Lewayne Bunting, MD     ICD Vendor:  [facility] Jude     ICD Model Number:  [id number]     ICD Serial Number:  [id number] ICD DOI:  02/11/2008     ICD Implanting MD:  Sharlot Gowda  Ladona Ridgel, MD  Lead 1:    Location: RV     DOI: 02/11/2008     Model #: 7120     Serial #: [id number]     Status: active Lead 2:    Location: LV     DOI: 02/11/2008     Model #: 1158T     Serial #: [id number]     Status: active  Indications::  VF ARREST, CHF   ICD Follow Up Remote Check?  No Battery Voltage:  3.04 V     Charge Time:  11.3 seconds     Battery Est. Longevity:  4 years Underlying rhythm:  A-fib/RVR ICD Dependent:  No       ICD Device Measurements Right Ventricle:   Amplitude: 6.1 mV, Impedance: 410 ohms, Threshold: 0.75 V at 0.5 msec Left Ventricle:  Impedance: 300 ohms, Threshold: 0.75 V at 0.5 msec  Episodes Coumadin:  Yes Shock:  0     ATP:  0     Nonsustained:  0     Ventricular Pacing:  58%  Brady Parameters Mode VVIR     Lower Rate Limit:  60     Upper Rate Limit 120  Tachy Zones VF:  222     VT:  173     Next Remote Date:  09/13/2010     Next Cardiology Appt Due:  05/27/2011 Tech Comments:  No parameter changes. A-fib with RVR today, + coumadin.  Merlin transmissions every 3 months.  ROV 1 year with Dr. Ladona Ridgel. Altha Harm, LPN  June 15, 2010 10:13 AM  MD Comments:  Agree with above.  Impression & Recommendations:  Problem # 1:  CHRONIC SYSTOLIC HEART FAILURE (ICD-428.22) Her symptoms appear to be class 2.  A low sodium diet is requested and she will continue her current meds. Her updated medication list for this problem includes:    Coumadin 5 Mg Tabs (Warfarin sodium) .[name]... 1 tablet by mouth as directed as needed    Coumadin 1 Mg Tabs (Warfarin sodium) .[name]... 1 tablet by mouth as directed    Carvedilol 12.5 Mg Tabs (Carvedilol) .[name]... 1 tablet by mouth two times a day    Furosemide 40 Mg Tabs (Furosemide) .[name]... Take one tablet by mouth daily.    Digoxin 0.125 Mg Tabs (Digoxin) .[name]... Take a half  tablet by mouth daily  Problem # 2:  ATRIAL FIBRILLATION (ICD-427.31) Her atrial fib is still not optimally controlled but she is on a good medical regimen and is not symptomatic. Will follow. Her updated medication list for this problem includes:    Coumadin 5 Mg Tabs (Warfarin sodium) .[name]... 1 tablet by mouth as directed as needed    Coumadin 1 Mg Tabs (Warfarin sodium) .[name]... 1 tablet by mouth as directed    Carvedilol 12.5 Mg Tabs (Carvedilol) .[name]... 1 tablet by mouth two times a day    Digoxin 0.125 Mg Tabs (Digoxin) .[name]... Take a half  tablet by mouth daily  Problem # 3:  IMPLANTATION OF DEFIBRILLATOR, HX OF (ICD-V45.02) her device is  working normally today.  Will follow.  Patient Instructions: 1)  Your physician recommends that you continue on your current medications as directed. Please refer to the Current Medication list given to you today. 2)  Your physician wants you to follow-up in: 1 year  You will receive a reminder letter in the mail two months in advance. If you don't receive a letter, please call our office to schedule the follow-up appointment.

## 2010-09-25 NOTE — Progress Notes (Signed)
Summary: prep not covered by ins   Phone Note From Other Clinic Call back at [phone]   Caller: Aneta Mins from [facility] Call For: Angela [name] Summary of Call: Pharmacist states that the patient insurance does not cover Movie Prep it only cover generic Golytely, wants to know if Dr Angela [name] would lwant pt to use that one instead Initial call taken by: Tawni Levy,  May 17, 2009 9:01 AM  Follow-up for Phone Call        Advised pharmacy that Dr Angela [name] only uses moviprep. Patient is welcome to come by our office to pick up a 20.00 off coupon. Pharmacy states he will advise patient of this. Follow-up by: Hortense Ramal CMA Duncan Dull),  May 17, 2009 9:43 AM

## 2010-09-25 NOTE — Assessment & Plan Note (Signed)
Summary: DEFIB CHECK.SJM.AMBER  Medications Added DIGOXIN 0.125 MG TABS (DIGOXIN) Take a half  tablet by mouth daily      Allergies Added: NKDA  Visit Type:  Follow-up Referring Provider:  n/a Primary Provider:  Burton Apley, MD    History of Present Illness: Ms. Angela [name] returns today for followup.  She is a very pleasant middle- aged woman with a nonischemic cardiomyopathy, congestive heart failure status post VF arrest, history of bradycardia status post pacemaker insertion, who was ultimately resuscitated and underwent insertion of a biventricular ICD back in June 2009.  She returns today for followup.  She continues to improve.  Her dizziness has been better. Her gait instability is better. She is walking some.  She denies chest pain  or shortness of breath.  No intercurrent ICD shocks.  She does not feel much in the way of palpitations.  Current Medications (verified): 1)  Coumadin 5 Mg Tabs (Warfarin Sodium) .[name].. 1 Tablet By Mouth As Directed As Needed 2)  Coumadin 1 Mg Tabs (Warfarin Sodium) .[name].. 1 Tablet By Mouth As Directed 3)  Carvedilol 12.5 Mg Tabs (Carvedilol) .[name].. 1 Tablet By Mouth Two Times A Day 4)  Furosemide 40 Mg Tabs (Furosemide) .... Take One Tablet By Mouth Daily. 5)  Klor-Con 10 10 Meq Cr-Tabs (Potassium Chloride) .[name].. 1 Tablet By Mouth Once Daily  Allergies (verified): No Known Drug Allergies  Past History:  Past Medical History: Last updated: 06/05/2009 Current Problems:  CARDIOMYOPATHY (ICD-425.4) ATRIAL FIBRILLATION (ICD-427.31) DIVERTICULAR DISEASE (ICD-562.10) IMPLANTATION OF DEFIBRILLATOR, HX OF (ICD-V45.02) ENCOUNTER FOR LONG-TERM USE OF ANTICOAGULANTS (ICD-V58.61) CARCINOMA, COLON, HX OF (ICD-V10.05) T3, N1 Colon Cancer, 1999, chemotherapy Internal Hemorrhoids VF arrest Anoxic encephalopathy post arrest  Past Surgical History: Last updated: 05/11/2009 Mitral Valve Replacement, Bjork-Shiley valve 1983 Biventricular AICD RIght  hemicolectomy 1999   Review of Systems  The patient denies chest pain, syncope, dyspnea on exertion, and peripheral edema.    Vital Signs:  Patient profile:   65 year old female Height:      67 inches Weight:      131 pounds BMI:     20.59 Pulse rate:   85 / minute BP sitting:   108 / 83  (right arm)  Vitals Entered By: Laurance Flatten CMA (March 14, 2010 11:17 AM)  Physical Exam  General:  65 yo woman who looks younger than her stated age. Head:  normocephalic and atraumatic Eyes:  PERRLA/EOM intact; conjunctiva and lids normal. Mouth:  Teeth, gums and palate normal. Oral mucosa normal. Neck:  Neck supple, no JVD. No masses, thyromegaly or abnormal cervical nodes. Chest Wall:  Well healed ICD incision. Lungs:  Clear bilaterally with no wheezes, rales, or rhonchi.  No increased work of breathing. Heart:  RRR with mechanical S1 and S2.  PMI is enalarged and laterally displaced.  No murmur. Abdomen:  Bowel sounds positive; abdomen soft and non-tender without masses, organomegaly, or hernias noted. No hepatosplenomegaly. Msk:  Back normal, normal gait. Muscle strength and tone normal. Pulses:  pulses normal in all 4 extremities Extremities:  No clubbing or cyanosis. No edema. Neurologic:  Alert and oriented x 3.    ICD Specifications Following MD:  Lewayne Bunting, MD     ICD Vendor:  [facility] Jude     ICD Model Number:  [id number]     ICD Serial Number:  [id number] ICD DOI:  02/11/2008     ICD Implanting MD:  Lewayne Bunting, MD  Lead 1:    Location: RV  DOI: 02/11/2008     Model #: 7120     Serial #: [id number]     Status: active Lead 2:    Location: LV     DOI: 02/11/2008     Model #: 1158T     Serial #: [id number]     Status: active  Indications::  VF ARREST, CHF   ICD Follow Up Battery Voltage:  3.10 V     Charge Time:  10.8 seconds     Battery Est. Longevity:  4.6-5 yrs Underlying rhythm:  SR   ICD Device Measurements Right Ventricle:  Amplitude: 11.8 mV, Impedance: 410 ohms, Threshold:  0.75 V at 0.5 msec Left Ventricle:  Impedance: 300 ohms, Threshold: 0.75 V at 0.6 msec  Episodes MS Episodes:  36     Percent Mode Switch:  <1%     Shock:  0     ATP:  0     Nonsustained:  0     Ventricular Pacing:  32%  Brady Parameters Mode VVIR     Lower Rate Limit:  60     Upper Rate Limit 120  Tachy Zones VF:  222     VT:  173     Next Remote Date:  06/14/2010     Tech Comments:  PT ONLY BIV PACING 32%--CHANGED LRL FROM 60 TO 70 PER GT.  NORMAL DEVICE FUNCTION.  CHANGED RV OUTPUT FROM 2.0 TO 2.5 V.  MERLIN CHECK 06-14-10.  Vella Kohler  March 14, 2010 11:38 AM MD Comments:  Agree with above.  Impression & Recommendations:  Problem # 1:  ATRIAL FIBRILLATION (ICD-427.31) Her ventricular rates are not well controlled.  I have asked her to restart her digoxin as her rates are too high and her blood pressure is too low to add additional Coreg.  If this plan does not work, then I would consider stopping Coreg and starting metoprolol. The following medications were removed from the medication list:    Digoxin 0.125 Mg Tabs (Digoxin) .[name]... 1 tablet by mouth once daily Her updated medication list for this problem includes:    Coumadin 5 Mg Tabs (Warfarin sodium) .[name]... 1 tablet by mouth as directed as needed    Coumadin 1 Mg Tabs (Warfarin sodium) .[name]... 1 tablet by mouth as directed    Carvedilol 12.5 Mg Tabs (Carvedilol) .[name]... 1 tablet by mouth two times a day    Digoxin 0.125 Mg Tabs (Digoxin) .[name]... Take a half  tablet by mouth daily  Problem # 2:  CHRONIC SYSTOLIC HEART FAILURE (ICD-428.22) She remains class 2.  Continue meds as below. The following medications were removed from the medication list:    Digoxin 0.125 Mg Tabs (Digoxin) .[name]... 1 tablet by mouth once daily Her updated medication list for this problem includes:    Coumadin 5 Mg Tabs (Warfarin sodium) .[name]... 1 tablet by mouth as directed as needed    Coumadin 1 Mg Tabs (Warfarin sodium) .[name]... 1 tablet by mouth as directed     Carvedilol 12.5 Mg Tabs (Carvedilol) .[name]... 1 tablet by mouth two times a day    Furosemide 40 Mg Tabs (Furosemide) .[name]... Take one tablet by mouth daily.    Digoxin 0.125 Mg Tabs (Digoxin) .[name]... Take a half  tablet by mouth daily  Problem # 3:  IMPLANTATION OF DEFIBRILLATOR, HX OF (ICD-V45.02) Her device is working normally.  I have increased her backup pacing rate to 70/min.  Patient Instructions: 1)  Your physician recommends that you schedule a  follow-up appointment in: 3 months with Dr Ladona Ridgel 2)  Your physician has recommended you make the following change in your medication: re start Digoxin 0.125mg  daily Prescriptions: DIGOXIN 0.125 MG TABS (DIGOXIN) Take a half  tablet by mouth daily  #30 x 11   Entered by:   Dennis Bast, RN, BSN   Authorized by:   Laren Boom, MD, [facility]   Signed by:   Dennis Bast, RN, BSN on 03/14/2010   Method used:   Faxed to ...       [facility] (retail)       [address] [place]       [address]       [place], [place]  [address]       Ph: [phone]       Fax: [phone]   RxID:   [id number]

## 2010-09-25 NOTE — Procedures (Signed)
Summary: Upper Endo w polypectomy/[facility]  Upper Endo w polypectomy/[facility]   Imported By: Lester [place] 04/27/2009 08:27:00  _____________________________________________________________________  External Attachment:    Type:   Image     Comment:   External Document

## 2010-09-25 NOTE — Miscellaneous (Signed)
Summary: DEVICE PRELOAD  Clinical Lists Changes  Observations: Added new observation of ICD INDICATN: VF ARREST, CHF (10/06/2008 11:42) Added new observation of ICDLEADSTAT2: active (10/06/2008 11:42) Added new observation of ICDLEADSER2: [id number] (10/06/2008 11:42) Added new observation of ICDLEADMOD2: 1158T (10/06/2008 11:42) Added new observation of ICDLEADDOI2: 02/11/2008 (10/06/2008 11:42) Added new observation of ICDLEADLOC2: LV (10/06/2008 11:42) Added new observation of ICDLEADSTAT1: active (10/06/2008 11:42) Added new observation of ICDLEADSER1: [id number] (10/06/2008 11:42) Added new observation of ICDLEADMOD1: 7120  (10/06/2008 11:42) Added new observation of ICDLEADDOI1: 02/11/2008  (10/06/2008 11:42) Added new observation of ICDLEADLOC1: RV  (10/06/2008 11:42) Added new observation of ICD IMP MD: Lewayne Bunting, MD  (10/06/2008 11:42) Added new observation of ICD IMPL DTE: 02/11/2008  (10/06/2008 11:42) Added new observation of ICD SERL#: [id number]  (10/06/2008 11:42) Added new observation of ICD MODL#: 3207-36  (10/06/2008 11:42) Added new observation of ICDMANUFACTR: [employer] [facility]  (10/06/2008 11:42) Added new observation of CARDIO MD: Lewayne Bunting, MD  (10/06/2008 11:42)      ICD Specifications Following MD:  Lewayne Bunting, MD     ICD Vendor:  [employer]     ICD Model Number:  [id number]     ICD Serial Number:  [id number] ICD DOI:  02/11/2008     ICD Implanting MD:  Lewayne Bunting, MD  Lead 1:    Location: RV     DOI: 02/11/2008     Model #: 7120     Serial #: [id number]     Status: active Lead 2:    Location: LV     DOI: 02/11/2008     Model #: 1158T     Serial #: [id number]     Status: active  Indications::  VF ARREST, CHF

## 2010-09-25 NOTE — Cardiovascular Report (Signed)
Summary: Office Visit Remote  Office Visit Remote   Imported By: Roderic Ovens 12/13/2009 14:52:40  _____________________________________________________________________  External Attachment:    Type:   Image     Comment:   External Document

## 2010-09-25 NOTE — Procedures (Signed)
Summary: Colonoscopy/[facility]  Colonoscopy/[facility]   Imported By: Lester [place] 04/27/2009 08:29:59  _____________________________________________________________________  External Attachment:    Type:   Image     Comment:   External Document

## 2010-09-25 NOTE — Letter (Signed)
Summary: Patient Notice-Hyperplastic Polyps  [facility]  [address] [place], [place] [address]   Phone: [phone]  Fax: [phone]        May 24, 2009 MRN: [id number]    JODEL MAYHALL [address] [place], [place]  [address]    Dear Ms. Channell,  I am pleased to inform you that the colon polyp(s) removed during your recent colonoscopy was (were) found to be hyperplastic. These types of polyps are NOT pre-cancerous.  It is my recommendation that you have a repeat colonoscopy examination in 5 years for routine colorectal cancer screening.  Should you develop new or worsening symptoms of abdominal pain, bowel habit changes or bleeding from the rectum or bowels, please schedule an evaluation with either your primary care physician or with me.  Continue treatment plan as outlined the day of your exam.  Please call us if you are having persistent problems or have questions about your condition that have not been fully answered at this time.  Sincerely,  Meryl Dare MD [facility]  This letter has been electronically signed by your physician.

## 2010-09-25 NOTE — Cardiovascular Report (Signed)
Summary: Office Visit  Office Visit   Imported By: Roderic Ovens 06/15/2009 15:53:26  _____________________________________________________________________  External Attachment:    Type:   Image     Comment:   External Document

## 2010-09-25 NOTE — Procedures (Signed)
Summary: Colon/G Orr,MD  Colon/G Orr,MD   Imported By: Lester [place] 04/27/2009 08:20:04  _____________________________________________________________________  External Attachment:    Type:   Image     Comment:   External Document

## 2010-09-25 NOTE — Progress Notes (Signed)
   Walk in Patient Form Recieved " Pt.needs refill on Medicine" forwarded to Message Nurse. Angela [name]  September 27, 2009 8:21 AM'

## 2010-09-25 NOTE — Letter (Signed)
Summary: Anticoagulation Modification Letter  [facility]  [address] [place], [place] [address]   Phone: [phone]  Fax: [phone]    May 11, 2009  Re:    Angela [name] DOB:    03/07/1946 MRN:    [id number]    Dear Dr Sharrell Ku:  We have scheduled the above patient for an endoscopic procedure. Our records show that she is on anticoagulation therapy. Please advise as to how long the patient may come off their therapy of coumadin prior to the scheduled procedure(s) on 05/23/09.   Please fax back/or route the completed form to Milford Cage or Alesia Banda at [phone].  Thank you for your help with this matter.  Sincerely,  Milford Cage   Physician Recommendation:   Hold Coumadin 5 days prior ____________   Appended Document: Anticoagulation Modification Letter OK to hold coumadin for her procedure.  Low risk. GT  Appended Document: Anticoagulation Modification Letter I spoke with the patient, and  notified her to hold her coumadin starting 05-18-09

## 2010-09-25 NOTE — Letter (Signed)
Summary: Remote Device Check  [facility], Main Office  [address]   [place], [place] [address]   Phone: [phone]  Fax: [phone]     December 13, 2009 MRN: [id number]   Angela [name] [address] [place], [place]  [address]   Dear Ms. Viruet,   Your remote transmission was recieved and reviewed by your physician.  All diagnostics were within normal limits for you.    ___X___Your next office visit is scheduled for:  JULY 2011 WITH DR Ladona Ridgel. Please call our office to schedule an appointment.    Sincerely,  Proofreader

## 2010-09-28 NOTE — Procedures (Signed)
Summary: Upper Endo/[facility]  Upper Endo/[facility]   Imported By: Lester [place] 04/27/2009 08:25:32  _____________________________________________________________________  External Attachment:    Type:   Image     Comment:   External Document

## 2010-10-07 ENCOUNTER — Encounter (INDEPENDENT_AMBULATORY_CARE_PROVIDER_SITE_OTHER): Payer: Self-pay | Admitting: *Deleted

## 2010-10-17 NOTE — Letter (Signed)
Summary: Remote Device Check  [facility], Main Office  [address]   [place], [place] [address]   Phone: [phone]  Fax: [phone]     October 07, 2010 MRN: [id number]   DANYLLE OUK [address] [place], [place]  [address]   Dear Ms. Nauert,   Your remote transmission was recieved and reviewed by your physician.  All diagnostics were within normal limits for you.  __X___Your next transmission is scheduled for:  12-13-2010.  Please transmit at any time this day.  If you have a wireless device your transmission will be sent automatically.   Sincerely,  Vella Kohler

## 2010-10-17 NOTE — Cardiovascular Report (Signed)
Summary: Office Visit Remote   Office Visit Remote   Imported By: Roderic Ovens 10/09/2010 14:51:06  _____________________________________________________________________  External Attachment:    Type:   Image     Comment:   External Document

## 2010-10-23 ENCOUNTER — Inpatient Hospital Stay (HOSPITAL_COMMUNITY)
Admission: EM | Admit: 2010-10-23 | Discharge: 2010-10-31 | DRG: 312 | Disposition: A | Discharge status: Home / Self Care | Payer: No Typology Code available for payment source | Attending: Internal Medicine | Admitting: Internal Medicine

## 2010-10-23 DIAGNOSIS — Z95810 Presence of automatic (implantable) cardiac defibrillator: Secondary | ICD-10-CM

## 2010-10-23 DIAGNOSIS — I658 Occlusion and stenosis of other precerebral arteries: Secondary | ICD-10-CM | POA: Diagnosis present

## 2010-10-23 DIAGNOSIS — I252 Old myocardial infarction: Secondary | ICD-10-CM

## 2010-10-23 DIAGNOSIS — R55 Syncope and collapse: Principal | ICD-10-CM | POA: Diagnosis present

## 2010-10-23 DIAGNOSIS — I959 Hypotension, unspecified: Secondary | ICD-10-CM | POA: Diagnosis present

## 2010-10-23 DIAGNOSIS — Z954 Presence of other heart-valve replacement: Secondary | ICD-10-CM

## 2010-10-23 DIAGNOSIS — Z951 Presence of aortocoronary bypass graft: Secondary | ICD-10-CM

## 2010-10-23 DIAGNOSIS — Z7901 Long term (current) use of anticoagulants: Secondary | ICD-10-CM

## 2010-10-23 DIAGNOSIS — D72819 Decreased white blood cell count, unspecified: Secondary | ICD-10-CM | POA: Diagnosis present

## 2010-10-23 DIAGNOSIS — I428 Other cardiomyopathies: Secondary | ICD-10-CM | POA: Diagnosis present

## 2010-10-23 DIAGNOSIS — Z8673 Personal history of transient ischemic attack (TIA), and cerebral infarction without residual deficits: Secondary | ICD-10-CM

## 2010-10-23 DIAGNOSIS — Z86718 Personal history of other venous thrombosis and embolism: Secondary | ICD-10-CM

## 2010-10-23 DIAGNOSIS — I4891 Unspecified atrial fibrillation: Secondary | ICD-10-CM | POA: Diagnosis present

## 2010-10-23 DIAGNOSIS — I2510 Atherosclerotic heart disease of native coronary artery without angina pectoris: Secondary | ICD-10-CM | POA: Diagnosis present

## 2010-10-23 LAB — GLUCOSE, CAPILLARY: Glucose-Capillary: 98 mg/dL (ref 70–99)

## 2010-10-24 ENCOUNTER — Emergency Department (HOSPITAL_COMMUNITY): Payer: No Typology Code available for payment source

## 2010-10-24 ENCOUNTER — Inpatient Hospital Stay (HOSPITAL_COMMUNITY): Payer: No Typology Code available for payment source

## 2010-10-24 ENCOUNTER — Encounter (HOSPITAL_COMMUNITY): Payer: Self-pay

## 2010-10-24 DIAGNOSIS — I517 Cardiomegaly: Secondary | ICD-10-CM

## 2010-10-24 DIAGNOSIS — R55 Syncope and collapse: Secondary | ICD-10-CM

## 2010-10-24 LAB — CBC
HCT: 41.7 % (ref 36.0–46.0)
Hemoglobin: 13.4 g/dL (ref 12.0–15.0)
MCH: 29.7 pg (ref 26.0–34.0)
MCHC: 32.1 g/dL (ref 30.0–36.0)
MCV: 92.5 fL (ref 78.0–100.0)
Platelets: 143 10*3/uL — ABNORMAL LOW (ref 150–400)
RBC: 4.51 MIL/uL (ref 3.87–5.11)
RDW: 15 % (ref 11.5–15.5)
WBC: 4.7 10*3/uL (ref 4.0–10.5)

## 2010-10-24 LAB — COMPREHENSIVE METABOLIC PANEL
ALT: 16 U/L (ref 0–35)
AST: 22 U/L (ref 0–37)
Albumin: 3.5 g/dL (ref 3.5–5.2)
Alkaline Phosphatase: 62 U/L (ref 39–117)
BUN: 27 mg/dL — ABNORMAL HIGH (ref 6–23)
CO2: 26 mEq/L (ref 19–32)
Calcium: 8.9 mg/dL (ref 8.4–10.5)
Chloride: 105 mEq/L (ref 96–112)
Creatinine, Ser: 0.99 mg/dL (ref 0.4–1.2)
GFR calc Af Amer: >60 mL/min (ref >60)
GFR calc non Af Amer: 56 mL/min — ABNORMAL LOW (ref >60)
Glucose, Bld: 103 mg/dL — ABNORMAL HIGH (ref 70–99)
Potassium: 3.6 mEq/L (ref 3.5–5.1)
Sodium: 140 mEq/L (ref 135–145)
Total Bilirubin: 0.7 mg/dL (ref 0.3–1.2)
Total Protein: 6.6 g/dL (ref 6.0–8.3)

## 2010-10-24 LAB — URINALYSIS, ROUTINE W REFLEX MICROSCOPIC
Bilirubin Urine: NEGATIVE
Hgb urine dipstick: NEGATIVE
Ketones, ur: NEGATIVE mg/dL
Nitrite: NEGATIVE
Protein, ur: NEGATIVE mg/dL
Specific Gravity, Urine: 1.011 (ref 1.005–1.030)
Urine Glucose, Fasting: NEGATIVE mg/dL
Urobilinogen, UA: 0.2 mg/dL (ref 0.0–1.0)
pH: 5 (ref 5.0–8.0)

## 2010-10-24 LAB — DIFFERENTIAL
Basophils Absolute: 0 10*3/uL (ref 0.0–0.1)
Basophils Relative: 0 % (ref 0–1)
Eosinophils Absolute: 0.1 10*3/uL (ref 0.0–0.7)
Eosinophils Relative: 2 % (ref 0–5)
Lymphocytes Relative: 24 % (ref 12–46)
Lymphs Abs: 1.1 10*3/uL (ref 0.7–4.0)
Monocytes Absolute: 0.4 10*3/uL (ref 0.1–1.0)
Monocytes Relative: 9 % (ref 3–12)
Neutro Abs: 3 10*3/uL (ref 1.7–7.7)
Neutrophils Relative %: 65 % (ref 43–77)

## 2010-10-24 LAB — PROTIME-INR
INR: 1.45 (ref 0.00–1.49)
Prothrombin Time: 17.8 seconds — ABNORMAL HIGH (ref 11.6–15.2)

## 2010-10-24 LAB — DIGOXIN LEVEL: Digoxin Level: 0.2 ng/mL — ABNORMAL LOW (ref 0.8–2.0)

## 2010-10-24 LAB — CK TOTAL AND CKMB (NOT AT ARMC)
CK, MB: 2.4 ng/mL (ref 0.3–4.0)
CK, MB: 2.5 ng/mL (ref 0.3–4.0)
Relative Index: INVALID (ref 0.0–2.5)
Relative Index: 2.3 (ref 0.0–2.5)
Total CK: 96 U/L (ref 7–177)
Total CK: 110 U/L (ref 7–177)

## 2010-10-24 LAB — CARDIAC PANEL(CRET KIN+CKTOT+MB+TROPI)
CK, MB: 2.8 ng/mL (ref 0.3–4.0)
CK, MB: 2.1 ng/mL (ref 0.3–4.0)
Relative Index: 2.2 (ref 0.0–2.5)
Relative Index: 1.7 (ref 0.0–2.5)
Total CK: 129 U/L (ref 7–177)
Total CK: 124 U/L (ref 7–177)
Troponin I: 0.16 ng/mL — ABNORMAL HIGH (ref 0.00–0.06)
Troponin I: 0.16 ng/mL — ABNORMAL HIGH (ref 0.00–0.06)

## 2010-10-24 LAB — TROPONIN I
Troponin I: 0.14 ng/mL — ABNORMAL HIGH (ref 0.00–0.06)
Troponin I: 0.16 ng/mL — ABNORMAL HIGH (ref 0.00–0.06)

## 2010-10-24 MED ORDER — IOHEXOL 300 MG/ML  SOLN
80.0000 mL | Freq: Once | INTRAMUSCULAR | Status: AC | PRN
  Administered 2010-10-24: 80 mL via INTRAVENOUS

## 2010-10-25 ENCOUNTER — Other Ambulatory Visit (HOSPITAL_COMMUNITY): Payer: No Typology Code available for payment source

## 2010-10-25 ENCOUNTER — Inpatient Hospital Stay (HOSPITAL_COMMUNITY): Payer: No Typology Code available for payment source

## 2010-10-25 DIAGNOSIS — R55 Syncope and collapse: Secondary | ICD-10-CM

## 2010-10-25 DIAGNOSIS — I428 Other cardiomyopathies: Secondary | ICD-10-CM

## 2010-10-25 LAB — BASIC METABOLIC PANEL
BUN: 22 mg/dL (ref 6–23)
CO2: 26 mEq/L (ref 19–32)
Calcium: 9.3 mg/dL (ref 8.4–10.5)
Chloride: 108 mEq/L (ref 96–112)
Creatinine, Ser: 1.04 mg/dL (ref 0.4–1.2)
GFR calc Af Amer: >60 mL/min (ref >60)
GFR calc non Af Amer: 53 mL/min — ABNORMAL LOW (ref >60)
Glucose, Bld: 94 mg/dL (ref 70–99)
Potassium: 3.7 mEq/L (ref 3.5–5.1)
Sodium: 144 mEq/L (ref 135–145)

## 2010-10-25 LAB — CBC
HCT: 44.6 % (ref 36.0–46.0)
Hemoglobin: 14.1 g/dL (ref 12.0–15.0)
MCH: 29.3 pg (ref 26.0–34.0)
MCHC: 31.6 g/dL (ref 30.0–36.0)
MCV: 92.7 fL (ref 78.0–100.0)
Platelets: 130 10*3/uL — ABNORMAL LOW (ref 150–400)
RBC: 4.81 MIL/uL (ref 3.87–5.11)
RDW: 15.1 % (ref 11.5–15.5)
WBC: 4.4 10*3/uL (ref 4.0–10.5)

## 2010-10-25 LAB — HEPARIN LEVEL (UNFRACTIONATED)
Heparin Unfractionated: 0.46 IU/mL (ref 0.30–0.70)
Heparin Unfractionated: 0.63 IU/mL (ref 0.30–0.70)
Heparin Unfractionated: 0.94 IU/mL — ABNORMAL HIGH (ref 0.30–0.70)

## 2010-10-25 LAB — PROTIME-INR
INR: 1.52 — ABNORMAL HIGH (ref 0.00–1.49)
Prothrombin Time: 18.5 seconds — ABNORMAL HIGH (ref 11.6–15.2)

## 2010-10-26 ENCOUNTER — Inpatient Hospital Stay (HOSPITAL_COMMUNITY): Payer: No Typology Code available for payment source

## 2010-10-26 LAB — BASIC METABOLIC PANEL
BUN: 20 mg/dL (ref 6–23)
CO2: 26 mEq/L (ref 19–32)
Calcium: 9 mg/dL (ref 8.4–10.5)
Chloride: 107 mEq/L (ref 96–112)
Creatinine, Ser: 1.08 mg/dL (ref 0.4–1.2)
GFR calc Af Amer: >60 mL/min (ref >60)
GFR calc non Af Amer: 51 mL/min — ABNORMAL LOW (ref >60)
Glucose, Bld: 87 mg/dL (ref 70–99)
Potassium: 3.7 mEq/L (ref 3.5–5.1)
Sodium: 141 mEq/L (ref 135–145)

## 2010-10-26 LAB — CBC
HCT: 43.3 % (ref 36.0–46.0)
Hemoglobin: 13.6 g/dL (ref 12.0–15.0)
MCH: 29.4 pg (ref 26.0–34.0)
MCHC: 31.4 g/dL (ref 30.0–36.0)
MCV: 93.5 fL (ref 78.0–100.0)
Platelets: 135 10*3/uL — ABNORMAL LOW (ref 150–400)
RBC: 4.63 MIL/uL (ref 3.87–5.11)
RDW: 15.4 % (ref 11.5–15.5)
WBC: 3.5 10*3/uL — ABNORMAL LOW (ref 4.0–10.5)

## 2010-10-26 LAB — PROTIME-INR
INR: 1.73 — ABNORMAL HIGH (ref 0.00–1.49)
Prothrombin Time: 20.4 seconds — ABNORMAL HIGH (ref 11.6–15.2)

## 2010-10-26 LAB — HEPARIN LEVEL (UNFRACTIONATED): Heparin Unfractionated: 0.34 IU/mL (ref 0.30–0.70)

## 2010-10-27 ENCOUNTER — Inpatient Hospital Stay (HOSPITAL_COMMUNITY): Payer: No Typology Code available for payment source

## 2010-10-27 DIAGNOSIS — I472 Ventricular tachycardia: Secondary | ICD-10-CM

## 2010-10-27 LAB — PROTIME-INR
INR: 2.02 — ABNORMAL HIGH (ref 0.00–1.49)
Prothrombin Time: 23 seconds — ABNORMAL HIGH (ref 11.6–15.2)

## 2010-10-27 LAB — HEPARIN LEVEL (UNFRACTIONATED)
Heparin Unfractionated: 0.19 IU/mL — ABNORMAL LOW (ref 0.30–0.70)
Heparin Unfractionated: 0.27 IU/mL — ABNORMAL LOW (ref 0.30–0.70)

## 2010-10-27 LAB — CBC
HCT: 41.1 % (ref 36.0–46.0)
Hemoglobin: 13 g/dL (ref 12.0–15.0)
MCH: 29.4 pg (ref 26.0–34.0)
MCHC: 31.6 g/dL (ref 30.0–36.0)
MCV: 93 fL (ref 78.0–100.0)
Platelets: 121 10*3/uL — ABNORMAL LOW (ref 150–400)
RBC: 4.42 MIL/uL (ref 3.87–5.11)
RDW: 15 % (ref 11.5–15.5)
WBC: 2.5 10*3/uL — ABNORMAL LOW (ref 4.0–10.5)

## 2010-10-27 MED ORDER — IOHEXOL 300 MG/ML  SOLN
100.0000 mL | Freq: Once | INTRAMUSCULAR | Status: AC | PRN
  Administered 2010-10-27: 100 mL via INTRAVENOUS

## 2010-10-28 ENCOUNTER — Inpatient Hospital Stay (HOSPITAL_COMMUNITY): Payer: No Typology Code available for payment source

## 2010-10-28 LAB — DIFFERENTIAL
Basophils Absolute: 0 10*3/uL (ref 0.0–0.1)
Basophils Relative: 0 % (ref 0–1)
Eosinophils Absolute: 0.1 10*3/uL (ref 0.0–0.7)
Eosinophils Relative: 2 % (ref 0–5)
Lymphocytes Relative: 24 % (ref 12–46)
Lymphs Abs: 0.8 10*3/uL (ref 0.7–4.0)
Monocytes Absolute: 0.3 10*3/uL (ref 0.1–1.0)
Monocytes Relative: 9 % (ref 3–12)
Neutro Abs: 2.2 10*3/uL (ref 1.7–7.7)
Neutrophils Relative %: 66 % (ref 43–77)

## 2010-10-28 LAB — CBC
HCT: 42.6 % (ref 36.0–46.0)
Hemoglobin: 13.5 g/dL (ref 12.0–15.0)
MCH: 29.9 pg (ref 26.0–34.0)
MCHC: 31.7 g/dL (ref 30.0–36.0)
MCV: 94.5 fL (ref 78.0–100.0)
Platelets: 130 10*3/uL — ABNORMAL LOW (ref 150–400)
RBC: 4.51 MIL/uL (ref 3.87–5.11)
RDW: 15.2 % (ref 11.5–15.5)
WBC: 3.3 10*3/uL — ABNORMAL LOW (ref 4.0–10.5)

## 2010-10-28 LAB — HEPARIN LEVEL (UNFRACTIONATED)
Heparin Unfractionated: 0.39 IU/mL (ref 0.30–0.70)
Heparin Unfractionated: 0.6 IU/mL (ref 0.30–0.70)

## 2010-10-28 LAB — PROTIME-INR
INR: 2.03 — ABNORMAL HIGH (ref 0.00–1.49)
Prothrombin Time: 23.1 seconds — ABNORMAL HIGH (ref 11.6–15.2)

## 2010-10-29 LAB — BASIC METABOLIC PANEL
BUN: 14 mg/dL (ref 6–23)
CO2: 24 mEq/L (ref 19–32)
Calcium: 9.1 mg/dL (ref 8.4–10.5)
Chloride: 108 mEq/L (ref 96–112)
Creatinine, Ser: 0.85 mg/dL (ref 0.4–1.2)
GFR calc Af Amer: >60 mL/min (ref >60)
GFR calc non Af Amer: >60 mL/min (ref >60)
Glucose, Bld: 96 mg/dL (ref 70–99)
Potassium: 3.7 mEq/L (ref 3.5–5.1)
Sodium: 139 mEq/L (ref 135–145)

## 2010-10-29 LAB — CBC
HCT: 41.6 % (ref 36.0–46.0)
Hemoglobin: 12.9 g/dL (ref 12.0–15.0)
MCH: 28.7 pg (ref 26.0–34.0)
MCHC: 31 g/dL (ref 30.0–36.0)
MCV: 92.4 fL (ref 78.0–100.0)
Platelets: 131 10*3/uL — ABNORMAL LOW (ref 150–400)
RBC: 4.5 MIL/uL (ref 3.87–5.11)
RDW: 14.9 % (ref 11.5–15.5)
WBC: 2 10*3/uL — ABNORMAL LOW (ref 4.0–10.5)

## 2010-10-29 LAB — HEPARIN LEVEL (UNFRACTIONATED): Heparin Unfractionated: 0.41 IU/mL (ref 0.30–0.70)

## 2010-10-29 LAB — PROTIME-INR
INR: 2.19 — ABNORMAL HIGH (ref 0.00–1.49)
Prothrombin Time: 24.5 seconds — ABNORMAL HIGH (ref 11.6–15.2)

## 2010-10-30 LAB — CBC
HCT: 41.7 % (ref 36.0–46.0)
Hemoglobin: 13.3 g/dL (ref 12.0–15.0)
MCH: 29.7 pg (ref 26.0–34.0)
MCHC: 31.9 g/dL (ref 30.0–36.0)
MCV: 93.1 fL (ref 78.0–100.0)
Platelets: 127 10*3/uL — ABNORMAL LOW (ref 150–400)
RBC: 4.48 MIL/uL (ref 3.87–5.11)
RDW: 15 % (ref 11.5–15.5)
WBC: 2.2 10*3/uL — ABNORMAL LOW (ref 4.0–10.5)

## 2010-10-30 LAB — HEPARIN LEVEL (UNFRACTIONATED): Heparin Unfractionated: 0.33 IU/mL (ref 0.30–0.70)

## 2010-10-30 LAB — PROTIME-INR
INR: 2.24 — ABNORMAL HIGH (ref 0.00–1.49)
Prothrombin Time: 24.9 seconds — ABNORMAL HIGH (ref 11.6–15.2)

## 2010-10-31 LAB — HEPARIN LEVEL (UNFRACTIONATED): Heparin Unfractionated: 0.39 IU/mL (ref 0.30–0.70)

## 2010-10-31 LAB — CBC
HCT: 43.5 % (ref 36.0–46.0)
Hemoglobin: 13.8 g/dL (ref 12.0–15.0)
MCH: 29.4 pg (ref 26.0–34.0)
MCHC: 31.7 g/dL (ref 30.0–36.0)
MCV: 92.8 fL (ref 78.0–100.0)
Platelets: 141 10*3/uL — ABNORMAL LOW (ref 150–400)
RBC: 4.69 MIL/uL (ref 3.87–5.11)
RDW: 15 % (ref 11.5–15.5)
WBC: 2.4 10*3/uL — ABNORMAL LOW (ref 4.0–10.5)

## 2010-10-31 LAB — PROTIME-INR
INR: 2.56 — ABNORMAL HIGH (ref 0.00–1.49)
Prothrombin Time: 27.6 seconds — ABNORMAL HIGH (ref 11.6–15.2)

## 2010-11-02 NOTE — Consult Note (Addendum)
NAMEFREEDOM, LOPEZPEREZ NO.:  [id number]  MEDICAL RECORD NO.:  [id number]           PATIENT TYPE:  I  LOCATION:  1402                         FACILITY:  [facility]  PHYSICIAN:  Peter M. Swaziland, M.D.  DATE OF BIRTH:  1945-10-20  DATE OF CONSULTATION:  10/24/2010 DATE OF DISCHARGE:                                CONSULTATION   PRIMARY CARDIOLOGIST:  Doylene Canning. Ladona Ridgel, MD  PRIMARY MEDICAL DOCTOR:  Dr. Merilynn Finland.  CHIEF COMPLAINT:  Syncope.  HISTORY OF PRESENT ILLNESS:  Ms. Cronce is a 65 year old female with a history of nonischemic cardiomyopathy with an EF of 10-20%, last assessed in 2009, status post Bi-V ICD with history of VF arrest at that time, AFib, and normal coronary arteries by cath in 2003 with a low EF at that time as well.  She also has a history of possible DVT.  She was admitted with an episode of falling out.  The patient does not seem to know the details of the event, and per history and reports she was walking to the bathroom and went down and felt limp, according to her husband.  There was no incontinence, seizure activity.  She was somnolent, her husband called 911.  EMS brought the patient to ER for evaluation.  She does not take her ICD fired.  She was feeling lightheaded slightly before, and had some occasional dizziness for a while.  She has occasional headaches. In review of ER note, there does not seem to be any evidence of abnormal presenting rhythm.  Initial EKG showed a rate of 76 beats per minute with an electronically paced rhythm with occasional PVCs.  CT angio of the chest was obtained which did not show pulmonary embolism.  Please note her INR is subtherapeutic at 1.45. Cardiac enzymes are mildly positive with troponin was 0.14, then 0.16, and 0.16.  CT of the head on October 24, 2010, showed a hyperdense branch in the MCA in the Sylvian fissure.  The primary team called Neurology who recommended they perform a CT angio of the brain  which is pending at this time.  Currently she feels somewhat better without chest pain, palpitations, nausea, or vomiting, but does have slight dizziness.  She states she has been taking her medicine as prescribed and her INR is followed by Dr. Merilynn Finland here in town.  It is unclear why her INR is subtherapeutic.  She has been given Lovenox x1 in the ER.  PAST MEDICAL HISTORY: 1. Nonischemic cardiomyopathy with an EF of 10-20%, last assessed by     echo June 2009, history of normal coronary arteries and an EF of 15-     20% in 2003. 2. 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 3. Atrial fibrillation since 1993, history of bradycardia, status post     pacemaker implanted in 1995 with upgrade Bi-V ICD, [employer] device     in June 2009. 4. Colon cancer. 5. Internal hemorrhoids. 6. Mitral valve replacement with BJORK-Shiley valve in 1983, goal INR     2.5-3.5. 7. Possible  history of DVT.  MEDICATIONS: 1. Coumadin. 2. Coreg 4.5 mg p.o. b.i.d. 3. Digoxin 0.125 mg daily.4. Lovenox 60 mg x1. 5. Lasix 40 mg daily. 6. Potassium chloride 10 mEq daily.  ALLERGIES:  No known drug allergies.  SOCIAL HISTORY:  The patient is married, has one child.  She denies any tobacco or alcohol use.  She is retired.  FAMILY HISTORY:  Positive for diabetes in her mother and lung cancer in her father.  REVIEW OF SYSTEMS:  No fevers, chills, chest pain, nausea or vomiting. All other systems reviewed, otherwise negative except those noted in the HPI.  LABORATORY DATA:  WBC 4.5, hemoglobin 13.4, hematocrit 41.7, platelet count 143.  Sodium 140, potassium 3.6, chloride 105, CO2 of 26, glucose 103,  BUN 27, creatinine 0.99, and LFTs within normal limits.  INR is 1.45.  Cardiac enzymes showed negative CKs and MBs, troponin 0.14, then 0.16, and 0.16.  UA is negative.  RADIOLOGY: 1. CT angio on October 24, 2010, showed cardiomegaly.  No PE or  other     acute findings. 2. CT head without contrast showed hyperdense branch in the MCA  in     the Sylvian fissure.  Acute vessel thrombosis nonoccluded.  PHYSICAL EXAMINATION:  VITAL SIGNS:  Temperature 98.1, pulse 81, respirations 18, blood pressure 126/85, and pulse ox 100% on room air. GENERAL:  This is a pleasant African American female in no acute distress. HEENT:  Normocephalic and atraumatic with extraocular movements intact and clear sclerae.  Nares without discharge. NECK:  Supple without JVD. HEART:  Auscultation reveals regular rate and rhythm with S1-S2 with a mechanical click auscultated. LUNGS:  Clear to auscultation bilaterally. ABDOMEN:  Soft, nontender, nondistended.  Positive bowel sounds. EXTREMITIES:  Warm and dry and without clubbing, cyanosis, or edema, 2+ pedal pulses bilaterally. NEUROLOGIC:  She is alert and oriented x2, responds to questions appropriately.  She is very quiet in voice tone.  ASSESSMENT AND PLAN:  The patient was seen and examined by Dr. Swaziland and myself.  This is a 64-year female with past medical history that includes mitral valve replacement of mechanical valve in 1983, VF arrest in 2009 with Bi-V ICD implantation at that time, nonischemic cardiomyopathy with an ejection fraction last assessed with 10-20% in June 2009, atrial fibrillation, normal coronary arteries by cath in 2003 as well as colon cancer who presents with an episode of possible syncope.  It is not entirely clear as to if the patient lost complete consciousness.  She started episode of falling out and relates history of dizziness and occasional headache.  There have been no recent sensational chest pain, shortness of breath.  Her INR is followed by her primary care provider, however, the patient's INR is unfortunately subtherapeutic at 1.45 and it is unclear if she has been taking at home. CTA showed question of MCA hyperdense area and could not occlude thrombus and CT  angio of the brain is pending at this time.  The rhythm on telemetry shows PVCs in triplets.  At this time, we would recommend to obtain a 2-D echocardiogram to rule out valvular dysfunction and reassess her ejection fraction, which has been ordered and is in process.  We have asked the [employer] representative to interrogate her ICD to rule out arrhythmia.  Her INR is subtherapeutic and Lovenox has not yet been studied in the use of valve replacement, so we will initiate IV heparin per pharmacy for a goal INR of 2.5-3.5.  We would also recommend  given her LV dysfunction to initiate ACE inhibitor in the form of lisinopril 10 mg p.o. daily.  Her blood pressure stable at 126/85.  It is unclear the etiology of her troponin elevation at this time, there is no evidence of acute ischemia in the history, renal insufficiency, pulmonary embolism.  We will have to await for ICD interrogation to determine if she was shocked at any point in time.  We will continue to follow with you.     Ronie Spies, P.A.C.   ______________________________ Peter M. Swaziland, M.D.    DD/MEDQ  D:  10/24/2010  T:  10/25/2010  Job:  [id number]  cc:   Doylene Canning. Ladona Ridgel, MD Dr. Merilynn Finland  Electronically Signed by PETER Swaziland M.D. on 11/02/2010 11:19:09 AM Electronically Signed by Ronie Spies  on 11/05/2010 12:45:14 PM

## 2010-11-07 NOTE — Discharge Summary (Signed)
NAMEKELSHA, OLDER NO.:  [id number]  MEDICAL RECORD NO.:  [id number]           PATIENT TYPE:  I  LOCATION:  1402                         FACILITY:  [facility]  PHYSICIAN:  Osvaldo Shipper, MD     DATE OF BIRTH:  1946-03-12  DATE OF ADMISSION:  10/23/2010 DATE OF DISCHARGE:                              DISCHARGE SUMMARY   POSSIBLE DATE OF DISCHARGE:  Either March 7 or March 8 depending on the INR.  PRIMARY CARE PHYSICIAN:  Antony Madura, M.D.  CARDIOLOGIST:  Doylene Canning. Ladona Ridgel, MD  CONSULTATION DURING THIS ADMISSION:  [facility].  IMAGING STUDIES DONE DURING THIS ADMISSION: 1. CT of the head which showed a hyperdense branch of the right MCA     and the sylvian fissure, acute vessel thrombosis, not excluded,     chronic ischemic changes and atrophy were seen. 2. CT head repeated on March 2 which again did not show any stroke. 3. CT angio of the head showed possible stenosis in the right MCA.  No focal occlusion, otherwise unremarkable CTA head. 4. CT angio of the chest did not show any PE, did show cardiomegaly     without any acute findings. 5. X-ray of the left elbow did not show any acute injuries. 6. Chest x-ray showed cardiomegaly with emphysema without acute     process.  PERTINENT LABS: 1. The patient ruled out for acute coronary syndrome by serial cardiac     enzymes.  However, her troponin was mildly elevated at 0.16,     however, it was not thought to be secondary to coronary syndrome. 2. She has leukopenia with white cell count between 2.2 and 3.3,     reason for this is not entirely clear.  She is afebrile. 3. INR when she came in was 1.45, today is 2.24.  Electrolytes were     all unremarkable.  UA was unremarkable.  Dig level was 0.2.  DISCHARGE DIAGNOSES: 1. Syncopal episode, etiology unclear.  Workup negative. 2. 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 3. Hypotension, improved  with dose adjustments to her medication. 4. History of mitral valve replacement, on anticoagulation, awaiting     therapeutic INR. 5. History of cardiomyopathy, status post AICD in the past. 6. Leukopenia, etiology unclear.  BRIEF HOSPITAL COURSE: 1. Syncope.  This is a 65 year old African American female with a     history of cardiomyopathy who presented to the hospital with     syncope.  She apparently was in the bathroom when this happened.     Evaluation in the hospital has failed to reveal any etiology for     this episode.  Orthostatics were negative.  She had a negative CT.     She has carotid Dopplers which did not show any significant     stenosis.  Echocardiogram was ordered but for some reason it has     not been read yet; however, cardiology has been following this     patient.  She also had a CT angio which did not reveal  a PE, so she     was asked to follow up with her cardiologist and her primary care     physician. 2. Abnormal CT angio head as mentioned above.  She was found to have a     possible stenosis in the right MCA without any obstruction.  I     discussed this case with Dr. Thad Ranger, the neurologist and she     recommended just outpatient followup and monitoring.  The patient     may benefit from being referred to a vascular surgeon, however, she     does not feel that this requires any kind of intervention at this     time.  She did recommend Coumadin and the patient is already on it.     So, I would request the PCP to monitor this and consider referral     to vascular surgeon. 3. History of mitral valve replacement, on anticoagulation.  Her INR     was subtherapeutic when she came in.  She was started on heparin     infusion and we are awaiting a therapeutic INR, it is 2.2 today.     She will require a higher dose of Coumadin and she has been taking     at home. 4. Hypotension.  The patient was started on Lasix and ACE inhibitor in     the hospital which  resulted in hypotension.  Lasix dose was     decreased and dose of ACE inhibitor was decreased; however, the     patient continued to be hypotensive, so the ACE inhibitor was     discontinued.  Lasix will be continued and the rest of the     antihypertensive doses were decreased as well.  Blood pressure     still running occasionally low but much better. 5. She has been ambulating here with no difficulties. 6. She has leukopenia etiology of which is not clear.  She has been     afebrile.  Followup with PCP.  Rest of her medical issues are stable at this time.  Once again we are awaiting a therapeutic INR before this patient get a discharge.  Today that is March 6, the patient denies any complaints.  No dizziness or lightheadedness.  No chest pains.  PHYSICAL EXAMINATION:  VITAL SIGNS:  Show temperature of 97.5, heart rate 73, respiratory rate 20, blood pressure 199/56, saturation 100% on room air. LUNGS:  Clear to auscultation bilaterally with no wheezing, rales or rhonchi. CARDIOVASCULAR:  S1 and S2 is normal, regular.  No S3, S4, rubs, murmurs or bruits. ABDOMEN:  Soft, nontender, nondistended.  Bowel sounds are present.  No masses or organomegaly is appreciated.  NEUROLOGICALLY:  She is alert and oriented x3.  No focal neurological deficits are present.  LABORATORY DATA:  Hemoglobin today is 13.3, platelet count is 127.  ASSESSMENT/PLAN:  As per above.  DISCHARGE MEDICATIONS:  I will dictate them but there is a possibility that these may be changed.  At this point, we anticipate the patient going on following: 1. Carvedilol 3.125 mg p.o. b.i.d. with meals. 2. Lasix 20 mg p.o. daily. 3. Coumadin 10 mg p.o. daily. 4. Digoxin 0.125 mg 1/2 tablet daily. 5. Potassium chloride 10 mEq daily.  Please note the dose of her warfarin has been changed along with the dose of carvedilol.  FOLLOWUP: 1. She will need to see her PCP for PT/INR check 4 days after     discharge. 2.  Followup with  cardiology as needed.  DIET:  Heart-healthy, physical activity as tolerated.  CHF instructions will be provided.  TOTAL TIME ON THIS DISCHARGE ENCOUNTER:  Thirty five minutes.  Osvaldo Shipper, MD     GK/MEDQ  D:  10/30/2010  T:  10/30/2010  Job:  [id number]  cc:   Antony Madura, M.D. Fax: [phone]  Doylene Canning. Ladona Ridgel, MD [address]  [address] [place] [address]  Electronically Signed by Osvaldo Shipper MD on 11/07/2010 07:22:53 PM

## 2010-12-06 NOTE — H&P (Signed)
Angela [name], ARTIST NO.:  [id number]  MEDICAL RECORD NO.:  [id number]           PATIENT TYPE:  E  LOCATION:  WLED                         FACILITY:  [facility]  PHYSICIAN:  Massie Maroon, MD        DATE OF BIRTH:  [birth year]  DATE OF ADMISSION:  10/23/2010 DATE OF DISCHARGE:                             HISTORY & PHYSICAL   CHIEF COMPLAINT:  Near syncope.  HISTORY OF PRESENT ILLNESS:  A 65 year old female with a history of CAD status post CABG, aortic valve replacement (subtherapeutic INR), DVT, presents with complaints of near syncope.  She apparently went to the bathroom and then went down and felt limp according to her husband.  She looked like she was sleeping.  There was no tongue lac or incontinence. There was no evidence of any seizure activity.  The patient seemed to her husband to be somewhat somnolent for about 13 minutes and so he called 911 and EMS brought the patient to the emergency room for evaluation.  CT scan of the brain showed possible right MCA hyperdensity, acute vessel thrombosis is not excluded.  The patient cannot have an MRI due to the fact that she has a pacemaker.  I called Neurology and they recommended that we perform a CT angio brain.  A CT angio chest is also pending.  Her troponin was noted to be elevated at 0.14.  A second set of cardiac markers are still pending.  The patient does not have any description of any chest pain, palpitations, nausea, vomiting, focal neurological signs such as focal neurological weakness. The patient will be admitted for near syncope/? syncope and possible myocardial infarction.  I have tried to contact Cardiology this morning and there was no response x3.  Please consult them this morning in regards to the possibility of myocardial infarction after you get this next set of cardiac markers as well as to interrogate her pacemaker.  PAST MEDICAL HISTORY: 1. CAD status post MI, V-Fib arrest, status post  mitral valve     replacement with a Bjork-Shiley valve 1983. 2. CVA. 3. History of DVT. 4. History of colon cancer, status post resection 1999, status post     chemotherapy. 5. Cardiomyopathy. 6. Atrial fibrillation. 7. Status post implantation of a defibrillator.  SOCIAL HISTORY:  The patient is married and has 1 child.  She is retired from working at [employer].  She does not smoke or drink.  FAMILY HISTORY:  Mother is alive at age 67 and healthy and has diabetes. Her father died at age 67 from lung cancer and was a smoker.  ALLERGIES:  No known drug allergies.  MEDICATIONS: 1. Coumadin as directed. 2. Carvedilol 12.5 mg p.o. b.i.d. 3. Furosemide 40 mg p.o. daily. 4. Potassium chloride 10 mEq p.o. daily. 5. Digoxin 0.125 mg one half p.o. daily.  REVIEW OF SYSTEMS:  The patient notes that her prior stroke affected her left side.  Specifically there were no symptoms prior to her syncope. Orthostatics were not done.  Review of systems is otherwise negative for all 10 organ systems except for pertinent positives  stated above.  PHYSICAL EXAMINATION:  VITAL SIGNS:  Temperature 97.7, pulse 70, blood pressure 104/64, pulse ox 100% on room air. HEENT:  Anicteric. NECK:  No JVD. HEART:  Regular rate and rhythm, S1, S2, with a 1/6 systolic ejection murmur right upper sternal border. LUNGS:  Clear to auscultation bilaterally. ABDOMEN:  Soft, nontender, nondistended.  Positive bowel sounds. EXTREMITIES:  No cyanosis, clubbing, or edema. Skin:  No rashes. LYMPH NODES:  No adenopathy. NEUROLOGIC:  Nonfocal.  Cranial nerves II through XII intact.  Reflexes 2+, symmetric, diffuse with downgoing toes bilaterally, motor strength 5/5 in all 4 extremities, pinprick intact.  LABORATORY DATA:  WBC 4.7, hemoglobin 13.4, platelet count 143, INR 1.45.  Urinalysis negative.  Digoxin 0.2 (low).  Sodium 140, potassium 3.6, BUN 27, creatinine 0.99, AST 22, ALT 16, alk phos 62,  total bilirubin 0.7, CPK 96, CK-MB 2.4, troponin-I 0.14.  IMAGING:  Left elbow x-ray, no acute fracture, no acute bony injuries. CT brain, hyperdense branch of the right MCA in the sylvian fissure, acute vessel thrombosis is not excluded.  ASSESSMENT/PLAN: 1. Syncope/near syncope:  We will check a CTA brain as suggested by     Neurology.  We will check a carotid ultrasound and cardiac 2-D echo     as well as a CT angio chest to rule out pulmonary embolus in light     of her history of DVT as well as the fact that she is not fully     anticoagulated.  We will cycle cardiac markers especially in the     light of the fact that her initial troponin is slightly positive.     Her EKG showed a paced rhythm and there was no comparison available     is what I am told by the ED.  I have attempted to contact     Cardiology, unsuccessful, please contact them this morning to have     her pacemaker interrogated and also to give input on positive     cardiac marker. 2. Myocardial infarction:  We will cycle cardiac markers, Lovenox 1     mg/kg subcu x1. 3. Atrial fibrillation/mitral valve replacement with a Bjork-Shiley     valve:  INR is subtherapeutic.  Lovenox 1 mg/kg subcu x1 and     pharmacy to dose Coumadin.  May need to continue Lovenox or start     heparin IV if she     remained subtherapeutic in terms of her INR.  Cardiac 2-D echo is     pending and this will give [place] a look at her mitral valve. 4. Deep vein thrombosis prophylaxis:  Coumadin.     Massie Maroon, MD     JYK/MEDQ  D:  10/24/2010  T:  10/24/2010  Job:  [id number]  cc:   Antony Madura, M.D. Fax: [phone]  Doylene Canning. Ladona Ridgel, MD [address]  [address] [place] [address]  Electronically Signed by Pearson Grippe MD on 12/06/2010 01:08:09 AM

## 2010-12-13 ENCOUNTER — Ambulatory Visit (INDEPENDENT_AMBULATORY_CARE_PROVIDER_SITE_OTHER): Payer: No Typology Code available for payment source | Admitting: *Deleted

## 2010-12-13 DIAGNOSIS — Z95810 Presence of automatic (implantable) cardiac defibrillator: Secondary | ICD-10-CM

## 2010-12-13 DIAGNOSIS — I428 Other cardiomyopathies: Secondary | ICD-10-CM

## 2010-12-14 ENCOUNTER — Other Ambulatory Visit: Payer: Self-pay | Admitting: Internal Medicine

## 2010-12-19 ENCOUNTER — Encounter: Payer: Self-pay | Admitting: *Deleted

## 2010-12-21 NOTE — Progress Notes (Signed)
icd remote  

## 2011-01-08 NOTE — Discharge Summary (Signed)
Angela [name], LATTERELL NO.:  [id number]   MEDICAL RECORD NO.:  [id number]          PATIENT TYPE:  INP   LOCATION:  2036                         FACILITY:  [facility]   PHYSICIAN:  Madaline Savage, MD        DATE OF BIRTH:  1945/12/29   DATE OF ADMISSION:  01/24/2008  DATE OF DISCHARGE:                               DISCHARGE SUMMARY   DATE OF DISCHARGE:  Yet to be determined.   ADDENDUM:  Please note this is an addendum to discharge summary dictated  by Dr. Delton Coombes on February 12, 2008.  This patient was under the care of  critical care doctors from Jan 24, 2008 to February 12, 2008.  This covers  the dates from February 13, 2008 to February 16, 2008.   OTHER DIAGNOSES:  1. Multiple falls.  2. Severe deconditioning.   PROBLEM LIST:  1. Status post ventricular fibrillation cardiac arrest.  She had a      biventricular ICD placed at this time.  She has been cleared by the      electrophysiologist.  She will need to follow up with her heart      doctors as an outpatient.  2. Atrial fibrillation, now this is rate controlled.  She will      continue on her current medications as before.  3. Mitral valve replacement in 1983.  She is on Coumadin for it.  Her      INR is therapeutic.  4. Falls.  Ms. Smaltz is severely deconditioned.  She has had 2 falls.      Her falls in her case are high risk because of the fact that she is      on Coumadin.  I have told her that she needs to ask for help when      she tries try to get up.  She does have deconditioning and will      need physical therapy and outpatient therapy as an outpatient  5. Dysphagia.  We we are waiting for a repeat modified barium swallow      which will be done today or tomorrow.  According to the results, we      can change her diet.   DISCHARGE MEDICATIONS:  1. Lasix 40 mg by mouth twice daily.  2. Coumadin as directed.  3. Protonix 40 mg twice daily.  4. Coreg 3.125 mg twice daily.  5. Senokot-S 1 tablet daily.  6. Ensure  chocolate pudding 1 can 3 times daily.  7. Tylenol 650 mg 4 times daily as needed.   DISPOSITION:  At this time, we are planning to transfer her to a skilled  nursing facility for further physical therapy and occupational therapy.  We are waiting on her results of her modified barium swallow.  Please  see the final dictation for any changes in her discharge medications or  any changes in her further hospital course.      Madaline Savage, MD  Electronically Signed     PKN/MEDQ  D:  02/16/2008  T:  02/16/2008  Job:  [id number]

## 2011-01-08 NOTE — Discharge Summary (Signed)
NAMESHELITA, STEPTOE NO.:  [id number]   MEDICAL RECORD NO.:  [id number]          PATIENT TYPE:  INP   LOCATION:  2036                         FACILITY:  [facility]   PHYSICIAN:  Leslye Peer, MD    DATE OF BIRTH:  11/17/1945   DATE OF ADMISSION:  01/24/2008  DATE OF DISCHARGE:  02/11/2008                               DISCHARGE SUMMARY   DISCHARGE DIAGNOSES:  1. Status post cardiac arrest with induction of hypothymia protocol.  2. Iatrogenic pneumothorax.  3. History of ischemic cardiomyopathy.  4. History of atrial fibrillation and tachybrady syndrome.  5. Anoxic encephalopathy.  6. Dysphagia.  7. Mitral valve replacement.  8. Hyperglycemia.  9. Debility.   PROCEDURES:  1. On February 11, 2008, she had a new AICD placed by Dr. Lewayne Bunting.  2. Oral endotracheal tube placed Jan 24, 2008, removed January 30, 2008.  3. Left femoral A-line placed Jan 24, 2008, removed February 01, 2008.  4. Left IJ triple lumen catheter placed Jan 24, 2008, removed February 05, 2008.  5. Right subclavian introducer line placed February 05, 2008.  6. Right upper extremity PICC line placed February 05, 2008.   LABORATORY DATA:  On February 06, 2008, troponin 0.2, CK 35, CK-MB 1.6.  On  February 09, 2008, sodium 138, potassium 4, chloride 105, CO2 27, BUN 13,  creatinine 0.63.  Hemoglobin 11.6, hematocrit 34.4, white blood cell  count 6.4, platelet count 412.   BRIEF HISTORY:  This 65 year old African American female with a history  of mitral valve replacement in 1988, with a Bjork-Shiley valve, also  history of rheumatic heart disease, pacemaker insertion in 1995 for  atrial fibrillation, she had a pulseless ventricular fibrillation arrest  in the field.  She was defibrillated twice and intubated.  There was an  estimated down time of less than 6 minutes before return of spontaneous  circulating pulse, however, did have immediate bystander CPR with  witnessed arrest.  She was transferred to [facility] and  initiated on the hypothymia protocol.   HOSPITAL COURSE BY DISCHARGE DIAGNOSIS:  1. Status post ventricular fibrillation arrest in the setting of known      nonischemic cardiomyopathy, tachy-brady syndrome, and mitral valve      disease.  She was admitted to the pulmonary critical care service      undergoing active resuscitation in the emergency room.  Immediately      during the stabilization process, the hypothermia protocol was      initiated.  This was continued for 24 hours.  Cardiology, was also      consulted in the emergency room.  Of note, her pacemaker was not      capturing during initial evaluation.  She did develop hemodynamic      stabilization and was eventually and able to undergo AICD redo on      February 11, 2008.  She is now recovering following this procedure,      with focus on medical management of underlying cardiomyopathy,      atrial fibrillation, and  blood pressure control.  2. Acute respiratory failure secondary problem #1.  As previously      mentioned, Ms. Saling had a cardiopulmonary arrest in setting of      ventricular fibrillation.  This was partially complicated by      element of hypoxic encephalopathy.  She continued to improve over      the course of her hospitalization.  She was successfully extubated      without pulmonary sequelae.  3. History of iatrogenic pneumothorax.  This is following attempt at      central line placement.  This is resolved without intervention.  4. Dysphagia.  Plan for this is to continue current recommendations      per speech language pathology.  5. Anoxic encephalopathy following cardiac arrest.  She actually      continues to improve quite a bit from a cognitive standpoint and      she we likely be able to be discharged to home.  6. History of mitral valve replacement.  For this, she will continue      lifelong Coumadin.  7. Debility.  The patient has significant weakness following prolonged      critical  illness.  Physical therapy and occupational therapy are      involved.  She did suffer a fall during the hospitalization without      injury from this.  At this point, she continues to require      inpatient care, however, we will continue to evaluate this.   DISPOSITION:  Currently she has been transferred to the [facility] E  service effective February 12, 2008.  No further pulmonary critical follow-  up is required.   DISCHARGE MEDICATIONS:  Yet to be finalized.      Zenia Resides, NP      Leslye Peer, MD  Electronically Signed    PB/MEDQ  D:  02/12/2008  T:  02/12/2008  Job:  [id number]

## 2011-01-08 NOTE — Consult Note (Signed)
Angela [name], Angela [name] NO.:  [id number]   MEDICAL RECORD NO.:  [id number]          PATIENT TYPE:  INP   LOCATION:  2904                         FACILITY:  [facility]   PHYSICIAN:  Melvyn Novas, M.D.  DATE OF BIRTH:  [birth year]   DATE OF CONSULTATION:  DATE OF DISCHARGE:                                 CONSULTATION   ATTENDING [place] CARDIOLOGIST:  Gerrit Friends. Dietrich Pates, MD, [facility].   CONSULT DATE:  January 28, 2008.   REQUESTED BY:  Gerrit Friends. Dietrich Pates, MD, [facility]   This is a 65 year old African American married right-handed female who  collapsed last Sunday on Jan 24, 2008, after a church service and was  brought to the hospital.  She had to be resuscitated, was pulseless on V-  tech fibrillations and arrived intubated.  She required 6 minutes before  a pulse was re-established.  She was sedated here in the ER to facilitate her intubation and  placement on a vent.  A hypothermia protocol was then initiated.  She had been on fentanyl and propofol for 24 hours as well as on the  hypothermia propofol drip.  On Tuesday morning, she was still not responding to any noxious stimuli  yesterday.    She did not show any responsivity, so the neurology consult was  actually called to give a prognosis to the family members.  Today as the  patient had not received specific sedatives, a chest tube had to be  placed and she was placed on some pain medicine.  Interestingly, she  became more responsive and she seemed for the first time ever to move  her gaze and her face purposefully to watch her husband or husband's  voice.   PAST MEDICAL HISTORY:  The patient used to be fully anticoagulated on  Coumadin after a mitral valve replacement.  She had mitral valve  stenosis after rheumatic fever, had a pacemaker implanted, has a  residual infection of around 20%, history of hypertension, peripheral  vascular disease and peripheral arterial disease, and had a thrombectomy  from the right  femoral artery listed in her surgical procedure list.   \Family medical history is positive for coronary artery disease.   Medications at home were Lasix, Coumadin, and multiple antihypertensives  which her husband could not recall.   PHYSICAL EXAM:  VITAL SIGNS:  The patient has a blood pressure of 103/65  and a respiratory rate of 20.  The patient is breathing above the vent  which is set at 12.  She also oxygenizes 100% on the vent.  She has a  heart rate of still 80 that appears regular, but a paced rhythm is  identifiable.   The patient is difficult to arouse.  She is not responsive to touch, but  I moved passively any of her 4 extremities she shows no resistance and  no spontaneous movement.  She has upgoing toes bilaterally to the  Babinski maneuver.  Deep tendon reflexes are flaccid.  She has a flaccid  tone in general.  Her face appears symmetric as far as I can tell since  the  patient is intubated.  Massage of the cricoid shows an intact gag  reflex now.  The patient opened her eyes spontaneously and seemed to  look at me.  When I asked her specifically to follow my hand with her  right-sided preferred gaze towards the left to which she reacted by  doing exactly that.  She also closed her eyes multiple times to visual  threat.  She has intact corneal responses.  She has equal pupils, and  her gaze is conjugate.  There was no spontaneous grip however and no  spontaneous extremity movements noted.   ASSESSMENT:  The patient is likely to have intact brain stem function,  but at least partial cortical function could be identified today as she  followed the gaze directions and seems to comprehend simple verbal  commands.  The prognosis neurologically is therefore very good.   PLAN:  Reheparinize the patient without bolus on IV heparin protocol and  to restart the Coumadin which she needs to keep her valve open.  Neurologically, the main treatment for this patient will be to  withhold  sedatives and narcotics and to allow her to regain her mental status.  I  hope that she can be slowly weaned off the ventilator.  The patient is  on IV antibiotics and showed several metabolic derangement for which  Critical Care Medicine is treating her.  She has not shown any seizure  activity.  She does not show any focal abnormalities that would relate  to a stroke,      Melvyn Novas, M.D.  Electronically Signed     CD/MEDQ  D:  01/28/2008  T:  01/29/2008  Job:  [id number]   cc:   Felipa Evener, MD  Gerrit Friends. Dietrich Pates, MD, [facility]

## 2011-01-08 NOTE — Consult Note (Signed)
NAMESHELBIE, FRANKEN NO.:  [id number]   MEDICAL RECORD NO.:  [id number]          PATIENT TYPE:  INP   LOCATION:  2904                         FACILITY:  [facility]   PHYSICIAN:  Gerrit Friends. Dietrich Pates, MD, FACCDATE OF BIRTH:  [birth year]   DATE OF CONSULTATION:  01/24/2008  DATE OF DISCHARGE:                                 CONSULTATION   REFERRING PHYSICIAN:  Oley Balm. Sung Amabile, MD   PRIMARY CARE PHYSICIAN:  Antony Madura, MD.   PRIMARY CARDIOLOGIST:  Previously Othelia Pulling, MD   CURRENT CARDIOLOGIST:  Unknown, but Dr. Lucas Mallow and Dr. Aleen Campi have been  involved with the patient's care in the past.   HISTORY OF PRESENT ILLNESS:  This is a 65 year old woman transported to  the hospital by EMS after resuscitation in the field from a witnessed  VT/VF arrest from which she was defibrillated.  Ms. Jorden has had  longstanding rheumatic heart disease.  Her primary lesion was apparently  mitral regurgitation.  In 1983, she underwent mitral valve replacement  with a Bjork-Shiley device.  Coronary arteries were normal at that time  and at a subsequent catheterization in 2003.  She did have a nonischemic  cardiomyopathy with an ejection fraction of 0.20.  Nonetheless, she has  subsequently done very well from a cardiac standpoint, maintaining a  good level of activity.  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  She has had longstanding atrial fibrillation  and subsequently sick sinus syndrome requiring implantation of a  permanent pacemaker in 1995.  She apparently has never undergone  generator replacement nor revision.  She was admitted for transient  blindness in 2003 and subsequently suffered an embolus to her right  femoral artery for which vascular surgery intervention was required.  She has not been seen at the hospital since that time except for  outpatient radiographic procedures.  She is chronically maintained on  warfarin therapy and  diuretics.  She takes multiple other medications,  but the identity of these is unknown.   The current episode began while she was in church.  She did not feel  well, walked from the main room of the church and then was noted to  collapse.  EMS was summoned and bystander CPR initiated.  She was in  ventricular fibrillation when EMS arrived and was defibrillated.  She  also received amiodarone at the scene and was intubated.  She was  reintubated with a larger tube after reaching the hospital.  Central  line and arterial line replaced.  She was initially hypotensive,  received pressors and fluids, but subsequently developed hypertension.  She has been maintained on neuromuscular blocking agents as part of the  hypothermia protocol.   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  She also underwent a right colectomy for colon cancer  in 1999 and subsequently received chemotherapy.  She has had no  recurrence of either neoplastic disease.  She has had hypertension.   SOCIAL HISTORY:  She retired in her 63s.  She has been  able to do her  own housework and to be active in her church.  She is married and has  one adult son.  No use of alcohol nor tobacco products.   FAMILY HISTORY:  Father died of lung cancer.   Her review of systems is unobtainable.  A questionable history of CVA  approximately 10 years ago according to the patient's husband.  Regular  diet at home.  Requires corrective lenses for near vision.  Upper and  lower dentures.   PHYSICAL EXAMINATION:  On exam, paralyzed, intubated woman.  The heart  rate is 50 and irregular with intermittent pacer spikes.  Blood pressure  140/50.  Temperature 33 on the hypothermia system.  HEENT:  Anicteric sclerae; normal oral mucosa.  Neck:  No jugular venous distention; no carotid bruits.  LUNGS:  Clear.  ABDOMEN:  Decreased bowel sounds; soft.  EXTREMITIES:  Distal pulses 1+; no edema.   NEUROLOGIC:  Unable to assess.   EKG:  Atrial fibrillation with controlled ventricular response;  occasional PVCs; right bundle branch block; left posterior fascicular  block; prominent inferior T-wave inversion.  When compared to a prior  tracing obtained December 20, 2001, a ventricular pacing no longer present.   CHEST X-RAY:  Pacemaker generator in the right infraclavicular region;  atrial and ventricular leads in place; excess wire curled around the  pacemaker generator with the end of one pacing lead not attached to the  generator.  Bjork-Shiley valve in the mitral position; status post  median sternotomy; cardiomegaly with left atrial and left ventricular  enlargement.  Megaly with left atrial enlargement; endotracheal tube in  the trachea 1 cm above the carina; left IJ catheter in the superior vena  cava.   Other laboratory notable for blood gas showing a pH of 7.35, pCO2 of 32,  and a pO2 of 386.  INR is 3.1.  Potassium is 3.2.  BUN and creatinine  are normal.  Bilirubin is somewhat elevated at 2.0 as is SGOT and SGPT.  Calcium is 7.4.  Lactic acid is elevated at 7.4.  CK is only 178 with MB  of 5.1 and troponin of 0.18.   IMPRESSION:  Ms. Hyson presents with cardiac arrest, almost certainly  related to a primary ventricular arrhythmia, which in turn is related to  her longstanding cardiomyopathy.  She received bystander CPR, prompt  fibrillation, and now prompt hypothermia.  There is a relatively good  chance for both hemodynamic and cerebral recovery.  From a cardiac  standpoint, supportive care is appropriate for now.  We will try to  interrogate the pacemaker to determine exactly how it is configured and  why there is no capture.  If the generator has in fact never been  changed, there is a high likelihood of end of life or near end of life.  If she has further bradycardia leading to hemodynamic problems, she will  likely require a temporary wire.  She received amiodarone  in the field.  This need not be continued unless she has recurrent serious arrhythmias.  She will be seen by the electrophysiology service in the morning for  attention to her pacemaker and to her ventricular arrhythmias.  We will  also determine who her primary cardiologist is and inform him of her  admission.      Gerrit Friends. Dietrich Pates, MD, [facility]  Electronically Signed     RMR/MEDQ  D:  01/24/2008  T:  01/25/2008  Job:  [id number]

## 2011-01-08 NOTE — Procedures (Signed)
EEG NUMBER:   CLINICAL HISTORY:  A 65 year old female with past medical history of  mitral valve regurgitation and replacement due to rheumatic heart  disease, and suffered cardiac arrest with V tach and atrial fibrillation  on Jan 24, 2008.  Previous EEG dated January 28, 2008, has demonstrated  disorganized posterior background and diffuse slowing, but fairly  symmetric activity.   This repeat EEG is for comparison.   CURRENT MEDICATIONS:  NovoLog, Lopressor, Lantus, Lasix, Protonix,  potassium 350, heparin, Rocephin, Ativan, Tylenol, and Neo-Synephrine.   TECHNICAL COMMENT:  An 18-channel EEG was performed based on standard  international 10/20 system, with the seventeenth channel dedicated to  EKG, which has demonstrated normal sinus rhythm.   The posterior background activity were well developed, fairly symmetric,  6-7 Hz in theta range, reactive to eye opening and closure.  There was  no epileptiform discharge recorded.   The patient was drowsy during recording as evident by diffuse slowing in  amplitude, but there was no deeper stage of sleep was achieved.  Hyperventilation and photic stimulation were not performed.   CONCLUSION:  This is a mild abnormal study.  There is evidence of mild  background slowing consistent with mild encephalopathic changes, but  overall has much improved compared to previous study dated January 28, 2008.      Levert Feinstein, MD  Electronically Signed     EA:VWUJ  D:  02/03/2008 11:51:14  T:  02/04/2008 03:40:35  Job #:  [id number]

## 2011-01-08 NOTE — Discharge Summary (Signed)
Angela [name], Angela [name] NO.:  [id number]   MEDICAL RECORD NO.:  [id number]          PATIENT TYPE:  INP   LOCATION:  2036                         FACILITY:  [facility]   PHYSICIAN:  Altha Harm, MDDATE OF BIRTH:  [birth year]   DATE OF ADMISSION:  01/24/2008  DATE OF DISCHARGE:  02/17/2008                               DISCHARGE SUMMARY   DISCHARGE DISPOSITION:  To nursing home.   Please refer to the discharge summary dictated on February 16, 2008 for  details of the hospital course up to that point.   HOSPITAL COURSE:  (From June 23-February 17, 2008)  Please note the  discharge diagnoses are all the same.  The discharge medications are  unchanged.  The only changes are that the patient had a modified barium  swallow performed today.   Recommendations from speech pathology is that raise her to a dysphagia 3  diet with nectar-thick liquids.  The patient is to perform a double  swallowing under full supervision while eating.  Medications are to be  given in applesauce.   Recommendations are that speech pathology to continue to follow with the  patient for therapy at the nursing home.   The patient's condition today --  The patient is afebrile.  Temperature  98.3, heart rate 77, respiratory rate 18, blood pressure 97/62, O2  saturations 97% on room air.  CBGs are running from 111 to 122.   FOLLOWUP:  The patient is to follow up with her primary care physician  in from 3-5 days.   FOLLOWUP LABORATORY STUDIES:  None at this time.   The patient is to continue with physical therapy and occupational  therapy.      Altha Harm, MD  Electronically Signed     MAM/MEDQ  D:  02/17/2008  T:  02/17/2008  Job:  [id number]

## 2011-01-08 NOTE — Assessment & Plan Note (Signed)
[facility]                         ELECTROPHYSIOLOGY OFFICE NOTE   Angela [name], Angela [name]                         MRN:          [id number]  DATE:06/07/2008                            DOB:          05-Oct-1945    Angela [name] returns today for followup.  She is a very pleasant middle-  aged woman with an question nonischemic cardiomyopathy, congestive heart  failure status post VF arrest, history of bradycardia status post  pacemaker insertion, who was ultimately resuscitated and underwent  insertion of a biventricular ICD back in June.  She returns today for  followup.  She continues to improve.  Her dizziness has been better.  Her gait instability is better.  She does still have some residual  dizziness early in the morning when she wakes up and otherwise stable.  She is walking some.  She denies chest pain or shortness of breath.   MEDICATIONS:  1. Coumadin as directed.  2. Protonix 40 twice a day.  3. Coreg 12.5 twice a day.  4. Potassium 10 a day.  5. Lasix 40 mg in the morning and 20 mg in the evening for which she      is now out of all of her Lasix.   PHYSICAL EXAMINATION:  GENERAL:  She is a pleasant well-appearing middle-  aged woman in no distress.  VITAL SIGNS:  Blood pressure is 116/70, the pulse is 60 and regular, the  respirations were 18, and the weight was 126 pounds. NECK:  No jugular  venous distention.  LUNGS:  Clear bilaterally to auscultation.  No wheezes, rales, or  rhonchi are present.  CARDIOVASCULAR:  Irregular rhythm with normal S1 and a soft S2.  There  is soft systolic murmur at the left lower sternal border.  EXTREMITIES:  No edema.   Interrogation of her defibrillator demonstrates a [employer] Promote with  R waves of 60, impedance of 490, and the threshold is 0.75 at 0.5.  The  battery voltage was great at 3.2 volts.  There are no intercurrent ICD  therapies.  Today, we turned her outputs down to 2.5 in the RV and in  the LV.   IMPRESSION:  1. Nonischemic cardiomyopathy.  2. Congestive heart failure.  3. Chronic atrial fibrillation.  4. Status post ventricular fibrillation arrest.  5. Status post biopsy biventricular implantable cardioverter-      defibrillator insertion for all of the above.   DISCUSSION:  Overall, Angela [name] is stable.  Her defibrillator is  working normally.  Her heart failure is well compensated.  She has had  no recurrence of syncopal episodes or arrhythmias.  Today, I have decreased her Lasix to 40 a day.  I have given her a new  prescription for this.  I will see her back in 6 months for arrhythmia  followup.     Doylene Canning. Ladona Ridgel, MD  Electronically Signed    GWT/MedQ  DD: 06/07/2008  DT: 06/08/2008  Job #: [id number]   cc:   Antionette Char, MD

## 2011-01-08 NOTE — Procedures (Signed)
EEG NUMBER:  [id number]   REFERRING PHYSICIAN:  Gerrit Friends. Dietrich Pates, MD, [facility]   This portable EEG recording was performed in the coronary care unit for  this 65 year old female patient with a status post ventricular  fibrillation arrest.  She was approximately 6 minutes down before  successful resuscitation.  The patient is currently on a ventilator.  All sedatives have been withheld, but she has not awoken up.  She does  open when her name is called.  The patient had a history of rheumatic  fever in childhood, developed rheumatic fever and valve disease, had a  valve replacement, and a pacemaker implanted in 1995.  She has known to  be in atrial fibrillation since 1993.  She has a very low ejection  fraction of 20%.   MEDICATIONS:  Toprol, Coumadin, digoxin, hydrochlorothiazide, Protonix,  NovoLog, Lantus, Ativan, Lopressor, Zosyn, vancomycin, and Cordarone.   Hyperventilation or photic stimulation were both not obtained.   DESCRIPTION:  A posterior dominant background rhythm was not obtained.  The EEG is too disorganized to show a dominance.  There are central  dominant rhythms in the theta range.  7 Hz for example at the C4-P4  electrode.  Brain wave activity over the central left and right region  appears symmetric.  I see no evidence of focal slowing and no  epileptiform activity.  EKG shows a paced rhythm.  Photic stimulation  was not performed.  Hyperventilation could not be performed in this  intubated patient.   CONCLUSION:  This is an abnormal EEG due to the generalized slowing, but  given the preserved symmetry and the absence of focal slowing or focal  epileptiform discharges, the clinical prognosis may not be bad.  I  recommend a repeat EEG on Monday to see if the described encephalopathy  is improving.      Melvyn Novas, M.D.  Electronically Signed     JY:NWGN  D:  01/29/2008 13:28:52  T:  01/30/2008 01:39:15  Job #:  [id number]   cc:   Gerrit Friends. Dietrich Pates, MD,  [facility]  [address]  [place], [place] [address]   Nelda Bucks, MD  [address] [place] [address]

## 2011-01-08 NOTE — Consult Note (Signed)
Angela [name], HASTINGS NO.:  [id number]   MEDICAL RECORD NO.:  [id number]          PATIENT TYPE:  INP   LOCATION:  2904                         FACILITY:  [facility]   PHYSICIAN:  Doylene Canning. Ladona Ridgel, MD    DATE OF BIRTH:  1946-06-22   DATE OF CONSULTATION:  01/26/2008  DATE OF DISCHARGE:                                 CONSULTATION   REQUESTING PHYSICIANS:  Dr. Aleen Campi and Dr. Dietrich Pates.   INDICATION FOR CONSULTATION:  Evaluation of VF arrest in the setting of  dilated cardiomyopathy.   HISTORY OF PRESENT ILLNESS:  The patient is a 65 year old woman with a  history of dilated cardiomyopathy and mitral valve replacement back in  1983.  She had a Bjork-Shiley placed at that time for rheumatic heart  disease.  She is status post permanent pacemaker insertion since 1995,  and has had atrial fibrillation since 1993.  The patient experienced a  VF arrest in the field and was resuscitated.  It was estimated that she  was down for approximately 6 minutes for return of spontaneous  circulation.  She is intubated.  She is sedated.  She is admitted for  additional evaluation.  The patient has undergone transesophageal echo  by Dr. Gala Romney.  The patient had a very large dilated right atrium and  a massively dilated left atrium with smoke.  Her RV was hypokinetic.  Her LV was dilated with severe global hypokinesis and EF of 20%-25%.  Her mitral valve was thought to be thickened but functioning normally.  Aortic valve was trileaflet.  The patient has remained intubated.  She  has had nonsustained VT and is now on amiodarone.  She is referred now  for additional evaluation.  Of note, when she was admitted to hospital,  her pacing lead was found to be not capturing and required the placement  of a temporary-permanent transvenous pacemaker.   PAST MEDICAL HISTORY:  Additional past medical history is notable for  catheterization in 2003 demonstrating no obstructive coronary disease.  She has longstanding hypertension.   SOCIAL HISTORY:  The patient was not taking tobacco or alcohol.  She  previously worked in [employer].   REVIEW OF SYSTEMS:  Could not be obtained, as she was subsequently  intubated and sedated.   PHYSICAL EXAMINATION:  GENERAL:  Notable that she is intubated, sedated.  VITAL SIGNS:  Her temperature is 98, pulse was 70, blood pressure  107/74, respirations were 16-18 on the ventilator.  HEENT:  Normocephalic and atraumatic.  Pupils are equal and round.  Oropharynx is moist but had an ET tube in place.  NECK:  Revealed 7-8 cm jugular venous distention.  No thyromegaly.  Trachea was midline.  LUNGS:  Lungs with scattered rales.  There were no wheezes or rhonchi  noted.  CARDIOVASCULAR:  Regular rate and rhythm with grade 2/6 systolic murmur  at the left lower sternal border.  The PMI was enlarged and laterally  displaced.  There is a prominent LV heave.  ABDOMEN:  Soft, nontender, and nondistended.  There is no organomegaly  noted.  The  bowel sounds are present.  There is no rebound or guarding.  EXTREMITIES:  Demonstrated no cyanosis, clubbing, or edema.  Pulses were  2+ and symmetric.   EKG demonstrates atrial fibrillation with ventricular pacing.   IMPRESSION:  1. Ventricular fibrillation arrest.  2. Nonsustained ventricular tachycardia.  3. Pacemaker lead fracture, status post insertion of temporary-      permanent transvenous pacer.  4. Complete heart block with severe bradycardia.  5. Dilated cardiomyopathy with severe left ventricular dysfunction.   DISCUSSION:  The patient is critically ill and has multitude of medical  problems.  Most importantly we will have to see if she can be extubated  and weaned from the ventilator.  Once this is accomplished, coronary  angiography would be recommended to evaluate her VF arrest, as she did  have elevation of her cardiac enzymes in the setting of this higher than  would be  expected for typical CPR.  Of note, the patient's INR was 3.1  at the time of her initial presentation making an embolic event much  less likely.  Following catheterization, ICD implantation would be  recommended at this point.      Doylene Canning. Ladona Ridgel, MD  Electronically Signed     GWT/MEDQ  D:  01/27/2008  T:  01/27/2008  Job:  [id number]   cc:   Jaclyn Prime. Lucas Mallow, M.D.

## 2011-01-08 NOTE — Assessment & Plan Note (Signed)
[facility]                         ELECTROPHYSIOLOGY OFFICE NOTE   Angela, [name]                         MRN:          [id number]  DATE:03/07/2008                            DOB:          [birth year]    Angela [name] returns today for followup.  She is a very pleasant woman  with a history of nonischemic cardiomyopathy who had a resuscitated  cardiac arrest.  She has a history of bradycardia and chronic AFib  secondary to a remote rheumatic heart disease.  The patient on her  hospitalization was subsequently found to have a early RV lead  dysfunction and had to have a temporary wire placed.  She ultimately had  neurologic improvement after initial prolonged period of encephalopathy  and ultimately underwent upgrade to a biventricular ICD with capping of  the atrial lead.  Postprocedure, the patient developed sinus rhythm, but  now returns today for followup on AFib.  She denies chest pain.  She  denies shortness of breath.  She has had improvement in her memory,  though it is still not back to normal.  She has trouble with gait and  balance and resides at the [facility] for rehab.   CURRENT MEDICINES:  1. Lasix 40 mg twice a day.  2. Coumadin as directed.  3. Protonix 40 twice a day.  4. Coreg 12.5 twice a day.  5. Ensure supplement.  6. Colace 100 twice daily.  7. Potassium 10 mEq daily.   PHYSICAL EXAMINATION:  GENERAL:  She is a pleasant fairly frail-  appearing middle-aged woman in no distress.  VITAL SIGNS:  Blood pressure was 87/48, the pulse 46 and regular,  respirations were 18, and the weight was 125 pounds.  NECK:  No jugular venous distention.  LUNGS:  Clear bilaterally to auscultation.  No wheezes, rales, or  rhonchi were present.  CARDIOVASCULAR:  Irregular rate and rhythm with normal S1 and S2.  Her  ICD insertion site was healed nicely.  ABDOMINAL:  Soft and nontender.  EXTREMITIES:  No peripheral edema.   Interrogation of her defibrillator demonstrates a St. Jude model [phone]  biventricular ICD.  The R-waves were 11, impedance 480 in the ventricle,  300 in the left ventricle, the threshold 0.75 at 0.5 in the RV, and 1.2  at 0.8 in the LV.  There are no intercurrent ICD therapies.   IMPRESSION:  1. Nonischemic cardiomyopathy.  2. Congestive heart failure.  3. Paroxysmal complete heart block.  4. Atrial fibrillation persistent/chronic.  5. Status post upgrade to a biventricular implantable cardioverter-      defibrillator secondary to all of the above.   DISCUSSION:  Overall, Angela [name] is stable.  I have recommended to help  increase her biventricular pacing that we gave her digoxin 0.125 mg  daily and will decrease her Lasix because I think she is a little dry  from 40 twice a day at 40 in the morning and 20 in the evening.  I will  plan to see the patient back in the office in approximately 3-4 months.     Doylene Canning.  Ladona Ridgel, MD  Electronically Signed    GWT/MedQ  DD: 03/07/2008  DT: 03/08/2008  Job #: [id number]   cc:   Antionette Char, MD

## 2011-01-08 NOTE — Op Note (Signed)
NAME:  Angela [name], Angela [name] NO.:  [id number]   MEDICAL RECORD NO.:  [id number]           PATIENT TYPE:   LOCATION:                                 FACILITY:   PHYSICIAN:  Doylene Canning. Ladona Ridgel, MD    DATE OF BIRTH:  1945-10-04   DATE OF PROCEDURE:  DATE OF DISCHARGE:                               OPERATIVE REPORT   PROCEDURE PERFORMED:  Implantation of a biventricular ICD.   INTRODUCTION:  The patient is a 65 year old woman with a VF arrest who  was admitted to hospital with VF and was subsequently found to have a  pacemaker lead, which was not functioning.  She had initial  resuscitation and anoxic encephalopathy and high-grade heart block and  underwent insertion of a temporary transvenous pacemaker.  She is now  referred for ICD implantation.  Because of her heart failure and severe  LV dysfunction, and because of the need for pacing majority of the time  secondary to bradycardia, the patient will receive a biventricular  device with the atrial port capped.   PROCEDURE:  After informed was obtained, the patient was taken to the  diagnostic EP lab in the fasting state.  After usual preparation and  draping, intravenous fentanyl and midazolam was given for sedation.  A  30 mL of lidocaine was infiltrated in the left infraclavicular region.  A 7-cm incision was carried out over this region.  Electrocautery was  utilized to dissect down the fascial plane.  Left subclavian vein was  then punctured x2 and the St. Jude Durata model 7120 60 cm active  fixation defibrillation lead serial number [id number] was advanced into  the right ventricle.  Mapping was carried out of the final site.  The R-  waves were 12, the impedance 650 ohms, and threshold 0.6 volts at 0.5  milliseconds with the lead actively fixed.  With these satisfactory  parameters, attention was then turned to placement of the LV lead.  The  coronary sinus guiding catheter was subsequently advanced into the right  atrium without difficulty.  A 6-French HexaPolar EP catheter was  inserted into the coronary sinus guiding catheter and utilized to  cannulate the CS.  The coronary sinus was cannulated without difficulty  but maintenance of the guiding catheter in the coronary sinus was made  more difficult because of the very large right atrium.  Ultimately, a  very large curved guiding catheter was utilized.  Venography of the  coronary sinus was carried out.  This demonstrated a large and very  difficult to traverse vein in the high lateral region.  Also  demonstrated a very small lateral vein and an acceptable posterolateral  vein.  With a subselective catheter, the lateral vein was cannulated and  the venography carried out.  This demonstrated no other additional  acceptable veins for use.  The St. Jude QuickFlex XL LV pacing leads  serial  [id number] was advanced by way of the guiding catheter into the  posterior lateral vein.  The lead was placed about one-half distance  from the base to the apex.  In this  location, the diaphragm was not  stimulated and the pacing threshold was 1.4 volts at 0.5 milliseconds.  The pacing impedance was 400 ohms.  With these satisfactory parameters,  (threshold is 1-1/2 at 0.5).  The lead was secured to the subpectoralis  fascia with figure-of-eight silk suture and the sewing sleeve was also  secured with silk suture.  Electrocautery was utilized to make  subcutaneous pocket.  Kanamycin irrigation was utilized to irrigate the  pocket.  Electrocautery utilized to assure hemostasis.  The St. Jude  Promote RF biventricular ICD serial [phone] was connected to the RV and  LV leads and placed back in the subcutaneous pocket.  Generator secured  with silk suture.  The patient was sedated for defibrillation threshold  testing.   After the patient was more deeply sedated with fentanyl and Versed, VF  was induced with T-wave shock.  A 15 joules shock was delivered failing  to  terminate VF.  A 25 joules shock was delivered, which terminated VF  and restored atrial fib, 5 minutes was allowed to elapse and second DFT  test carried out.  Again VF was induced with T-wave shock; at that time,  a 25 joules shock was delivered.  Terminated VF and  restored atrial  fib.  The patient at this point, had her incision closed with 2-0 Vicryl  followed by a layer of 3-0 Vicryl.  Benzoin was painted on skin, Steri-  Strips were applied, and a pressure dressing placed, and the patient was  returned to the room in satisfactory condition.   COMPLICATIONS:  There were no immediate procedure complications.   RESULTS:  Demonstrate successful implantation of a St. Jude  biventricular ICD in a patient with prior VF arrest, congestive heart  failure, and high-grade heart block and AFib.      Doylene Canning. Ladona Ridgel, MD  Electronically Signed     GWT/MEDQ  D:  02/11/2008  T:  02/12/2008  Job:  [id number]   cc:   Antionette Char, MD

## 2011-01-11 NOTE — Cardiovascular Report (Signed)
[place]  Patient:    DAISEY, CALOCA Visit Number: [id number] MRN: [id number]          Service Type: MED Location: [phone] Attending Physician:  Janalyn Rouse Dictated by:   Aram Candela. Aleen Campi, M.D. Proc. Date: 12/23/01 Admit Date:  12/20/2001   CC:         Fayrene Fearing C. Smitty Cords, M.D.   Cardiac Catheterization  REFERRING PHYSICIAN:  Winn Jock. Smitty Cords, M.D.  PROCEDURES: 1. Left heart catheterization. 2. Coronary cineangiography. 3. Left ventricular cineangiography. 4. Perclose of the right femoral artery.  CARDIOLOGIST:  Aram Candela. Aleen Campi, M.D.  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  She then had cardiac conduction disorder requiring permanent pacing which was placed in 1995 with a ventricular pacemaker.  She has a history of chronic atrial fibrillation.  She now presents with chest pain and shortness of breath and is noted to have an enlarged heart with documented left ventricular dysfunction with an ejection fraction of approximately 20%.  With this admission with chest pain, she had positive cardiac enzymes which raised the question of possible ischemic heart disease.  She was then scheduled for cardiac catheterization to assess her coronary status.  DESCRIPTION OF PROCEDURE:  After signing an informed consent, the patient was premedicated with 50 mg of Benadryl intravenously and brought to the cardiac catheterization lab at [facility].  Her right groin was prepped and draped in a sterile fashion and anesthetized locally with 0.1% lidocaine.  A #6 French introducer sheath was inserted percutaneously into the right femoral artery.  A 6 French #4 Judkins coronary catheters were used to make injections into the  native coronary arteries.  The 6 French pigtail catheter was used to measure pressures in the left ventricle and aorta and to make a midstream injection into the left ventricle.  The patient tolerated the procedure well and no complications were noted.  At the end of the procedure, the catheter and sheath were removed from the right femoral artery and hemostasis was easily obtained with the Perclose closure system.  Medications given none.  HEMODYNAMIC DATA:  Left ventricular pressure 97/0-7, aortic pressure 102/72 with a mean of 82.  Left ventricular ejection fraction between 15-20%.  CINE FINDINGS:  CORONARY CINE ANGIOGRAPHY:  Left coronary artery:  The ostium and left main appeared normal.  Left anterior descending:  The left anterior descending appears normal.  Circumflex coronary artery:  The circumflex coronary artery appears normal.  Right coronary artery:  The right coronary artery appears normal.  LEFT VENTRICULAR CINEANGIOGRAM:  The left ventricular chamber size is mild to moderate to severely enlarged.  The left ventricular contractility is severely decreased.  There is a discoordinate contractility probably due to pacemaker activity originating in the right ventricle.  There is generalized hypokinesia and an overall ejection fraction is severely decreased at approximately 15-20%.  There is a Bjork-Shiley prosthetic mitral valve which has moderate rocking of its supporting structure, however, there is no significant mitral insufficiency noted.  There was a trace seen.  The valve appears to have very normal mobility and opening during diastole.  FINAL DIAGNOSES: 1. Normal coronary arteries. 2. Severe dilated cardiomyopathy with severe left ventricular hypokinesia    with discoordinate contractility secondary to pacing and ejection  fraction    estimated at between 15-20%. 3. Successful Perclose of the right femoral artery.  DISPOSITION:  As per Dr. Ardeen Jourdain. Dictated  by:   Aram Candela. Aleen Campi, M.D. Attending Physician:  Janalyn Rouse DD:  12/23/01 TD:  12/23/01 Job: [id number]

## 2011-01-11 NOTE — Cardiovascular Report (Signed)
NAME:  Angela [name], Angela [name]                          ACCOUNT NO.:  [id number]   MEDICAL RECORD NO.:  [id number]                   PATIENT TYPE:  OIB   LOCATION:  2899                                 FACILITY:  [facility]   PHYSICIAN:  Aram Candela. Tysinger, M.D.              DATE OF BIRTH:  1945/11/12   DATE OF PROCEDURE:  10/04/2003  DATE OF DISCHARGE:                              CARDIAC CATHETERIZATION   PROCEDURES:  Replacement of permanent pacemaker pulse generator.   INDICATION FOR PROCEDURES:  End-of-life characteristics on prior pacemaker  battery.   PROCEDURE:  After signing an informed consent, the patient was premedicated  with 5 mg of Valium by mouth and brought to the cardiac catheterization lab  at [facility].  Her right anterior chest and base of neck were  prepped and draped in sterile fashion and a right transverse subcuticular  plane was anesthetized locally overlying the existing pulse generator.  An  incision was made in this anesthetized plane with the incision being  deepened into the fibrous layer overlying the pulse generator.  This fibrous  layer was then incised exposing the pulse generator which was then removed  from the pocket.  The atrial lead was analyzed finding atrial fibrillation  which was also diagnosed previously on her office electrocardiograms.  The  atrial lead was then capped and the cap was sealed using 2-0 silk suture.  We then analyzed the ventricular lead finding good chronic thresholds with a  minimum voltage threshold of 0.8 V utilizing 1.1 milliamps of current. A  resistance was measured at 1023 ohms and the R wave sensitivity measured 4.3  mV.  After obtaining these pacing parameters, the wound was lavaged  profusely with kanamycin solution.  We then selected a new ventricular pulse  generator model Identity SR, model number 5172, serial number [id number].  After  properly analyzing the new pulse generator, it was attached to the  ventricular  lead in the usual fashion.  The pulse generator was then placed  within the previously formed pocket and the wound was closed in layers using  2-0 Dexon.  Final skin closure was obtained with a cutaneous layer of Steri-  Strips.  The patient tolerated the procedure well and no complications were  noted.  At the end of the procedure, a sterile bulky dressing was applied to  the wound and she was returned to the short stay unit in satisfactory  condition.   MEDICATIONS GIVEN:  None.   Pacemaker is noted to be functioning properly in the VVI mode.                                               John R. Aleen Campi, M.D.    JRT/MEDQ  D:  10/04/2003  T:  10/04/2003  Job:  [id number]

## 2011-01-11 NOTE — Discharge Summary (Signed)
[place]. [facility]  Patient:    Angela [name], Angela [name] Visit Number: [id number] MRN: [id number]          Service Type: MED Location: [phone] Attending Physician:  Janalyn [name] Dictated by:   Angela [name] Angela [name], M.D. Admit Date:  12/20/2001 Discharge Date: 01/05/2002   CC:         Angela [name], M.D.  Angela [name], M.D., [facility]  Angela R. Angela [name], M.D.   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  It was associated with profound dizziness and difficulty in standing and had to be assisted.  She denies any other symptoms at this time except the day prior to this, she had noted some slight discomfort in her left chest with slight radiation to her upper arm.  She had no history in the past to suggest angina and she had not had any other symptoms similar to this except about two years ago when she experienced abrupt dizziness and no loss of vision and clearing within a matter of two or three days.  She has had a long history of mitral valve disease with severe mitral regurgitation, a mechanical valve Bjork-Shiley 20 years ago.  She has been maintained on chronic anticoagulation of Coumadin.  This dosage was reduced somewhat following massive GI bleed two years ago, at which time carcinoma of the colon was found and a right hemicolectomy was performed and she received chemotherapy.  Since that time, she has had no residual evidence and has been followed by Dr. Arline [name] and declared totally free of any complications in that regard.  She does have a chronic low white count of about 2000.  She has had no recurrent GI bleed.  She also gives a past history of having had chronic atrial fibrillation with sinus node dysfunction and a permanent transvenous pacemaker inserted by Dr. Aleen [name] in  1996.  She also has been followed by regular echocardiograms and her ejection fraction was found to be reduced in the range of about 20% two years ago prior to the use of the chemotherapy given for her colon cancer.  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  PHYSICAL EXAMINATION:  On examination, she appeared to be well-developed, alert and oriented, and gave an accurate history of all of the past medical experiences.  VITAL SIGNS: Blood pressure was initially 100/70, pulse was 90 and regular at a paced regulation.  CHEST: No rales or wheezing.  HEART: Slightly rapid, but no gallop. There was a grade 2 apical systolic murmur with good closure sounds of the valve.  ABDOMEN: Flat and nontender.  Liver and spleen were not felt.  RECTAL: Initially was not done.  EXTREMITIES: Good bilateral peripheral pulses, no deformity.  Slight ankle edema bilaterally. NEUROLOGICAL: Entirely negative with no neurologic deficits except for the dizziness on upright position.  She denied any headache.  Lymphatics were negative.  LABORATORY DATA:  On admission high BMP of 1300, likewise CPK was elevated with MB of 33 and troponin of 9.5.  Hemoglobin 11.2, normal electrolytes. BUN 14, creatinine 1.5.  Blood sugar 105.  Blood oxygen was 95%.  CT scan of the head revealed no evidence of intracranial changes.  MRI could not be  done due to the mechanical valve.  HOSPITAL COURSE:  She was admitted with the suspect of a recent TIA manifested by dizziness that was clearing, but still persistent.  She maintained good cognitive capabilities and was quite alert and denied any other symptoms except for the weakness and dizziness.  She denied any chest pain at the time she was seen.   An EKG at the time revealed only a paced rhythm without evidence of ST changes, probably due to IV conduction  defect secondary to the pacing.  Echocardiogram revealed marked reduction in LV function with ejection fraction of about 15%.  There was no other evidence of valve dysfunction.  There was a dilated ventricle.  No evidence of any thrombi could be seen.  A resting thallium perfusion study revealed dilated marked left ventricular wall. There was no evidence of any filling defect.  However on resting, compared with a delayed film, there was a fixed inferior wall defect that could be compatible with a scar or old infarction.  She was seen in consultation with Angela [name], M.D. who recommended a cardiac catheterization done by Dr. Aleen [name]. This revealed normal coronaries and no evidence of valve dysfunction. Likewise it was noted that she had a marked asymmetry of contraction of the ventricle probably on the basis of the left pacer insert that produced the aberrant conduction.  She was seen in consult with Angela [name], M.D., [facility] who evaluated her for the possibility of biventricular pacing, but thought this was not indicated and did revise the pacer rhythm so that the rate was reduced from 90 to about 75.  Due to the sustained hypotension, she was given other medications to support blood pressure including Florinef 0.2 b.i.d., ACE inhibitors, and beta blockers were discontinued in an effort to bring up the blood pressure.  Gradually her pressure rose to about 110/60 and she was able to ambulate without major difficulty.  Bilateral elastic stockings were provided which seemed to have some effect.  She continued to have support with room physical therapy.  Her appetite was fairly good. Her lungs remained clear.  She was continued on heparin therapy in addition to the Coumadin therapy.  On [name] 4, 2003, she developed numbness in the right leg and was found to have femoral occlusion by Doppler study.  Angela [name], M.D.  performed a femoral embolectomy with success and return of  pulses which remained good to the time of discharge.  She did have a slight rise in fever overnight on one occasion, but this did not persist and she had slight edema of the right leg at the time of her discharge, but no evidence of phlebitis. Overall, she gradually improved and was discharged home with nursing care to continue three times a week and Coumadin dosage will be maintained at 5 mg daily, Florinef 0.2 b.i.d., Zestril 5 mg q.a.m., Toprol 12.5 mg at bedtime. Modest sodium restriction and diet.  Diuretic as needed for any edema or weight gain.  Coumadin dosage of 5 mg daily will be continued with a PT on a weekly basis.  Pro time at the time of discharge was 2.5.  This will be the target rate of around 3 is anticipated.  Prior to discharge, her cardiac enzymes had returned to normal, however, her BMP remained elevated at 1300 suggesting a dilated left ventricle with heart failure.  Overall assessment was that Ms. Boden had complications resulting from a longterm mitral valve replacement associated with thrombi which apparently  resulted in her admission symptoms of dizziness and transient visual loss.  She persisted in having permanent myolar for reading small print, estimated to be 20/40 to 20/50, although, she claimed that that had improved as she was in the hospital. She had no evidence of any other cognitive loss or reflex changes throughout the hospital stay.  Her basis for the elevated enzymes were thought to be compatible with an ischemic myocardial and possible necrotic process with normal coronary arteries.  It would have to be considered either an embolic phenomenon or a secondary effect from cardiomyopathy that reduced the coronary flow in the region of the inferior wall producing an abnormal ischemic change and possible clinical pattern of her hypotension.  Her prognosis at the present time immediately seems good.  Longterm care will be guarded.  FINAL DIAGNOSES: 1.  Multiple cardiac emboli, brain and to right leg with good resolution    secondary to valvular prosthesis. 2. Severe dilated cardiomyopathy with normal coronaries. 3. Embolus, right femoral. 4. Status post colon cancer, three years. 5. Status post pacemaker insertion, five years. 6. Status post multiple cardiac thrombi with emboli.  CONDITION ON DISCHARGE:  Much improved.  Visiting home nurses will be maintained three times a week and prothrombin time weekly. Dictated by:   Angela [name] Angela [name], M.D. Attending Physician:  Janalyn [name] DD:  01/05/02 TD:  01/06/02 Job: [id number]

## 2011-01-11 NOTE — H&P (Signed)
[place]. [facility]  Patient:    Angela [name], Angela [name] Visit Number: [id number] MRN: [id number]          Service Type: MED Location: [phone] Attending Physician:  Janalyn Rouse Dictated by:   Winn Jock Smitty Cords, M.D. Admit Date:  12/20/2001                           History and Physical  CHIEF COMPLAINT:  Loss of vision and marked dizziness and weakness, several hours prior to admission.  HISTORY OF PRESENT ILLNESS:  This is a 65 year old married female, who had rather abrupt onset of bilateral visual loss; which gradually has improved but has some persistent dimness.  She also has some marked dizziness and could not stand without assistance.  She denies any headache, nausea or vomiting and has had no clearcut vertigo-like symptoms.  This also has cleared considerably since she was brought to the emergency room.  She has not had any extremity weakness; remained very alert and has excellent memory of her past history.  She has had a history of TIA, similar to the present illness in 1999; which cleared spontaneously. This also was manifested by primarily weakness and dizziness, but there was no visual symptoms apparently reported.  She has been on chronic Coumadin therapy, averaging 5 mg daily, with an INR today of 1.6; her target INR had been approximately 2.0, but she has had variations in levels and has required a lower level of target due to the possibility of bleeding from her previous intestinal problem.  In addition, she has had recent precordial discomfort (atypical in nature); however, it radiates into her left arm.  This has been present off and on for the past two or three weeks, usually lasting about 30 min and is not associated with exertion or any other activity.  She denies any shortness of breath or change in her rhythm, or any orthopnea.  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  Also, she has had a mitral valve replacement in 1983 for severe mitral stenosis.  This has been well maintained without any notable complications, except mild congestive failure (which apparently had been fairly well controlled).  She has had no past history to suggest angina or coronary disease.  She has never been hypertensive.  In addition to above, she has had colon cancer; which was first picked up in 1999, and currently has been declared in excellent remission and recent evaluations reveal no evidence of recurrence.  She has no anemia or GI bleeding since her discovery of the malignancy.  She has had a good appetite. Her weight has been stable.  PAST MEDICAL HISTORY:  In addition to the above surgeries, she has had a hysterectomy.  SOCIAL HISTORY:  She is married with one son.  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  FAMILY HISTORY: Not remarkable.  Her mother is age 63 and in good health.  Her father died of lung cancer.  REVIEW OF SYSTEMS:  HEENT:  Primarily noted in present illness; she has not had any past history of sustained visual abnormalities.  CARDIOVASCULAR:  See present illness and past history.  RESPIRATORY:  She has had no history of pneumonia or severe congestive failure.  She has had, what is thought to be, mild diastolic failure  related to a mitral valve disease primarily. GASTROINTESTINAL:  Her appetite has been good.  There have been no change in her bowel habits.  History of colon cancer and now in remission.  OB/GYN: Gravida 1, para 1.  Takes no supplemental hormones.  MUSCULOSKELETAL: Essentially negative.  ENDOCRINE:  Weight has been stable; no history of diabetes.  NEURO/PSYCHIATRIC:  She has remained quite alert, active and has not shown any evidence of emotional disturbance.  ALLERGIES:  None to patients  knowledge.  PHYSICAL EXAMINATION:  GENERAL:  Reveals a pleasant, well developed female.  She is alert, cooperative.  Has good recall for dates and her past history.  VITAL SIGNS:  Blood pressure 110/80, pulse 90 with rare PVC, temperature 98.6, respirations 18 and quiet.  HEENT:  She lies flat.  There is no significant JVD, except moderately at 30-degrees.  No carotid bruit.  The neck is supple.  Vision grossly within normal range, with no field defect determined.  The degree of acuity, however, was not determined.  Hearing is good.  CHEST:  No deformity.  BREASTS:  Negative, no masses.  HEART:  Good valve closure sounds.  Grade 2 apical systolic murmur.  Rhythm fairly regular, with the pacer in place.  LUNGS:  Clear; no wheezing or rales.  ABDOMEN:  Flat, nontender; no masses.  RECTAL/PELVIC:  Not done.  EXTREMITIES:  Good peripheral pulses; slight ankle edema.  NEUROLOGIC:  Moves all extremities.  No abnormal reflexes.  SKIN:  Moist and warm.  LYMPHATICS:  Negative.  CURRENT LABORATORY DATA:  Reveals elevated cardiac enzymes.  BMP is pending. Other lab data is not remarkable.  The cardiac size is increased, with obvious left atrial prominence.  The lung fields are essentially clear.  Renal function is normal.  Prothrombin time, INR 1.6.  IMPRESSION: 1. Recent TIA suspected, now clearing. 2. Probable cardiac embolus. 3. Chest pain, atypical; with abnormal cardiac enzymes. 4. Status post mitral valve replacement in 1983. 5. VVI pacemaker placed in 1988. 6. Status post colon cancer in 1999.  PLAN:  Observe.  Obtain serial cardiac enzymes.  Neurology consult.  Continue an anticoagulant level to a higher level of INR.  Will be monitored and followed primarily for the recent TIA and the need to evaluate the possibility of cardiac ischemia. Dictated by:   Winn Jock Smitty Cords, M.D. Attending Physician:  Janalyn Rouse DD:  12/20/01 TD:  12/21/01 Job:  [id number]

## 2011-01-11 NOTE — Op Note (Signed)
   TNAMELAYANN, BLUETT NO.:  [id number]   MEDICAL RECORD NO.:  [id number]                   PATIENT TYPE:  AMB   LOCATION:  ENDO                                 FACILITY:  [facility]   PHYSICIAN:  Georgiana Spinner, M.D.                 DATE OF BIRTH:  23-May-1946   DATE OF PROCEDURE:  04/19/2002  DATE OF DISCHARGE:                                 OPERATIVE REPORT   PROCEDURE:  Colonoscopy.   INDICATIONS:  Colon cancer.   ANESTHESIA:  Demerol 60, Versed 6 mg.   DESCRIPTION OF PROCEDURE:  With patient mildly sedated in the left lateral  decubitus position, the Olympus videoscopic colonoscope was inserted in the  rectum and passed under direct vision through a tortuous colon to the  neocecum, identified by an opening to the small bowel.  This was  photographed.  From this point, the colonoscope was then slowly withdrawn,  taking circumferential views of the entire colonic mucosa, stopping only  then in the rectum which appeared normal on direct view and showed  hemorrhoids on retroflex view.  The endoscope was straightened and  withdrawn.  The patient's vital signs and pulse oximeter remained stable.  The patient tolerated the procedure well without apparent complications.   FINDINGS:  1. Internal hemorrhoids.  2. Diverticulosis, rare.  3. Otherwise, unremarkable exam.   PLAN:  Repeat examination possibly in two years.                                                Georgiana Spinner, M.D.    GMO/MEDQ  D:  04/19/2002  T:  04/20/2002  Job:  [id number]   cc:   Samul Dada, M.D.  [address] [facility]  [place]  [place] [address]  Fax: [phone]

## 2011-01-11 NOTE — Consult Note (Signed)
[place]. [facility]  Patient:    Angela [name], Angela [name] Visit Number: [id number] MRN: [id number]          Service Type: MED Location: [phone] Attending Physician:  Janalyn Rouse Dictated by:   Jaclyn Prime. Lucas Mallow, M.D. Admit Date:  12/20/2001   CC:         Winn Jock. Smitty Cords, M.D.  John R. Aleen Campi, M.D.   Consultation Report  DIRECTED TO:  Winn Jock. Smitty Cords, M.D.  REASON FOR CONSULTATION:  I appreciate the opportunity to participate in the care of the 65 year old Ms. [name], a patient of Dr. Othelia Pulling, by providing consultative services, at Dr. Zigmund Gottron request, in regard to her cardiac status and abnormal cardiac enzymes, and other evidence of congestive heart failure.  HISTORY OF PRESENT ILLNESS:  The patient was admitted to the hospital with abrupt onset of bilateral visual loss associated with a marked dizziness and full body weakness. These symptoms have gradually improved since coming to the hospital. She apparently had visual loss in both eyes.  She notes that for the past three weeks or so she has had recurrent episodes of anterior chest discomfort radiating into her left arm, not clearly associated with exertion, and not awakening her from sleep, but lasting about 30 minutes at a time and without any identifiable cause of onset related to exertion, etc. She has not awakened with these pains during the night.  PAST MEDICAL HISTORY: 1. Mitral valve replacement in 1983 with a Bjork-Shiley valve for rheumatic    heart disease. 2. In 1995, she had a pacemaker placed, and at that time had had atrial    fibrillation since 1993. 3. In 1999, she developed colon cancer first manifested by blood in the    stools. 4. She had a right hemicolectomy and six months of chemotherapy. She was seen    recently and was felt to be free of identifiable disease. 5. She has also had a prior hysterectomy for endometrial cancer. This    apparently was also  cured.  It is of note that she was able to walk three miles as recently as last week. Her left ventricular ejection fraction was known to be in the range of 15% prior to the discovery of her colon cancer and her chemotherapy. She apparently tolerated the chemotherapy well without developing overt congestive heart failure at the time.  According to a discussion with Dr. Smitty Cords, she had her only cardiac catheterization at the time of her mitral valve replacement, and at that time her coronary arteries were clean.  FAMILY HISTORY:  Her mother is 54 and in good health. Her father died of lung cancer.  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 She has been retired for some time. She does her own housework. She is usually able to walk as noted above. She is married and has one son. Her husband has also had a stroke.  REVIEW OF SYSTEMS:  CONSTITUTIONAL:  She denies fevers, chills, or sweats. She does have some intermittent ankle swelling which usually, but not always, goes down at night. She denies claudication. She has had some variation in her weight but no marked weight loss in the last year. EYES:  Her loss of vision as noted above, prior to that she was not having diplopia or blurring. ENT: She has had spells of dizziness on and off for some time now. CARDIOVASCULAR: See the History of  Present Illness. She denies PND or orthopnea. She does have nocturia x3-4 almost every night. RESPIRATORY:  No cough or wheezing. She has never smoked. She has no definite history of pneumonia. She did not have a severe respiratory tract infection so common in the community during the winter. GASTROINTESTINAL:  No heartburn, nausea, vomiting, or change in bowel habits. GENITOURINARY:  No problem except for nocturia noted above. MUSCULOSKELETAL:  She is bothered by joint pain. SKIN AND BREAST:  No rash or nodule. NEUROLOGICAL:  She has  not fainted. Her history of dizziness as noted above. According to a discussion with Dr. Smitty Cords, she had a rather similar problem several years ago. PSYCHIATRIC:  No overt depression or hallucinations. ENDOCRINE:  No known diabetes or thyroid disease. HEMATOLOGIC AND LYMPH:  No swelling in the neck, axillary, or groins. ALLERGIC LYMPHATIC: She has no known drug allergy. All the remaining symptoms in her comprehensive 14-system review are negative.  PHYSICAL EXAMINATION:  VITAL SIGNS:  Blood pressure 110/60, pulse 80 and regular, respirations now at rest 18 and unlabored.  GENERAL:  She is a thin, generally healthy-looking woman who looks younger than her stated age of 65. She is oriented to person, place, and time. Her mood and affect are quite pleasant.  HEENT:  Her conjunctival and visual exam no ______, icterus or arcus senilis. She has a full denture in the upper jaw and a partial in the lower. The oral mucosa reveals no pallor or cyanosis. There is minimal jugular venous distention without definite hepatojugular reflex.  NECK:  Supple and symmetrical. The trachea is midline and mobile. There is no palpable thyromegaly or cervical node, no carotid bruit or JVD.  LUNGS:  Her respiratory effort is normal at rest. Her lungs are clear to auscultation and percussion.  BACK:  Straight and there is no punch tenderness. Her gait is not tested. From the orthopedic standpoint she probably could undergo a stress test.  CARDIAC:  Apical impulse is markedly enlarged with a markedly prolonged duration and a dyskinetic apical impulse. There is also a right ventricular impulse along the left sternal border, and there are palpable mitral closure and opening sounds. There is a soft apical systolic murmur with no diastolic murmur and with no radiation. There is no definite third sound in additional to the mitral opening sound.   SKIN:  Skin and subcutaneous tissue reveal no stasis dermatitis  or ulcer. Her digits and nails reveal no clubbing or cyanosis.  ABDOMEN:  Flat and nontender. There is no palpable mass, and bowel sounds are present. There is no palpable enlarged liver or spleen. As indicated above, there is minimal if any hepatojugular reflux. The abdominal aorta is not palpable and there is no bruit. The femoral arteries are palpable and there is no bruit.  EXTREMITIES:  The pedal pulses are intact. Her legs reveal no edema or varicosity.  LABORATORY AND ACCESSORY DATA:  The available laboratory data are numerous. An EKG dated April 27th shows an entirely paced rhythm except for rare ectopy. An echocardiogram done today is consistent with prior echoes showing marked left ventricular enlargement, severe global left ventricular hypokinesis with an ejection fraction probably in the range of 10-15% at best, and marked left atrial enlargement. The right-sided chambers are not grossly enlarged.  Her cardiac enzymes have been abnormal, with a peak CK of 359 units total, MB 33 and her peak troponin-I has been 9.4. A brain natriuretic peptide was 1380, the maximum measurable in our laboratory. A CBC  has revealed a white cell count of 2600 with borderline low platelets. Her prothrombin time on admission was low, with an INR of 1.4, I think we must be concerned that is an issue as far as her apparent TI on admission is concerned.  IMPRESSION:  The patient presents a difficult problem, certainly. She appears to have remarkably good exercise tolerance considering the severity of her left ventricular dysfunction, but I think we must assume that that situation is not going to continue indefinitely. She has had chest pains for three weeks and has had an abnormal cardiac enzymes. I do not think we can distinguish subendocardial ischemia related to severe left ventricular dilatation from myocardial ischemia related to occlusive coronary artery disease or possibly embolic coronary  disease given her low INR.  I think her situation really demands that we be very certain whether there is anything to be done for her as far as coronary obstruction is concerned, and I think she should have a cardiac catheterization which of course would have significant risks and would also entail taking her off Coumadin altogether. Presumably this would not offer much additional risk to her since her INR was already so low on admission. If she has lesions which can be treated with angioplasty or stent, then I think certainly that should be done.  The second approach to treatment, of course in addition to very meticulous management of her congestive heart failure by medical means, is to seriously consider when she is going to need a transplant. At this point, I would favor a referral to the [facility] transplant program, so that she can be enrolled there and be at least recognized as a potential candidate for transplant. Certainly her functional status at this point does not warrant transplantation, but it is impossible to know whether she will rapidly deteriorate or not, and prior evaluation may be of value in that situation.  I appreciate the opportunity to meet this nice woman. I hope that these remarks are going to be helpful to you in your management of her case. I will discuss the cardiac catheterization with Dr. Aleen Campi and see if there is any chance of moving ahead this week. Dictated by:   Jaclyn Prime. Lucas Mallow, M.D.  Attending Physician:  Janalyn Rouse DD:  12/22/01 TD:  12/22/01 Job: [id number]

## 2011-01-11 NOTE — Procedures (Signed)
[facility]  Patient:    Angela [name], Angela [name]                       MRN: [id number] Proc. Date: 04/29/00 Adm. Date:  [id number] Attending:  Sabino Gasser                           Procedure Report  PROCEDURE:  Colonoscopy with polypectomy and biopsy.  INDICATION FOR PROCEDURE:  Colon cancer.  ANESTHESIA:  Demerol 50 mg, Versed 6 mg was given intravenously, preoperative ampicillin 2 gm and gentamycin 60 mg and because of the patients heart valve, the patient was on Coumadin.  DESCRIPTION OF PROCEDURE:  With the patient mildly sedated in the left lateral decubitus position subsequently on her back and then finally rolled to the right lateral decubitus position, the Olympus videoscopic pediatric colonoscope was inserted into the rectum and passed under direct vision into the neo-cecum through a tortuous colon. In the neo-cecum, a small polyp was seen, photographed and using hot biopsy forceps technique on a setting of 3:3 blended current, it was removed. There was good hemostasis. The endoscope was then withdrawn taking circumferential views of the entire colonic mucosa, stopping photograph at approximately 35 cm from the anal verge a questionable fold versus a new polyp. This was biopsied using biopsy technique, one bite taken. Again good hemostasis was noted. The endoscope was withdrawn to the rectum which appeared normal on direct and retroflexed view showed internal hemorrhoids. The patients vital signs and pulse oximeter remained stable. The patient tolerated the procedure well without apparent complications.  FINDINGS:  Polyp of neo-cecum removed by hot biopsy forceps technique. A question of a polyp at 35 cm versus a prominent fold biopsied at this point. Await biopsy report. The patient will call me for results and follow-up with me as an outpatient. Will check a protime and H&H at this time and treat according to results. DD:  04/29/00 TD:   04/29/00 Job: [id number]

## 2011-01-11 NOTE — Op Note (Signed)
[place]. [facility]  Patient:    Angela [name], Angela [name] Visit Number: [id number] MRN: [id number]          Service Type: MED Location: [phone] Attending Physician:  Janalyn Rouse Dictated by:   Quita Skye Hart [place], M.D. Proc. Date: 12/31/01 Admit Date:  12/20/2001 Discharge Date: 01/05/2002   CC:         Fayrene Fearing C. Smitty Cords, M.D.   Operative Report  PREOPERATIVE DIAGNOSIS:  Ischemic right leg secondary to right iliofemoral embolus.  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  OPERATION:  Right femoral embolectomy.  SURGEON:  Quita Skye. Hart [place], M.D.  FIRST ASSISTANT:  Nurse.  ANESTHESIA:  Local.  DESCRIPTION OF PROCEDURE:  The patient was taken to the operating room and placed in the supine position, at which time the right lower extremity was prepped with Betadine scrub and solution and draped in a routine sterile manner.  After infiltration with 1% Xylocaine with epinephrine, a longitudinal incision was made in the inguinal region over the femoral triangle.  The common superficial and profunda femoris were re-encircled with vessel loops. There was no palpable pulse in any of the vessel.  Heparin 5000 units was given intravenously and a transverse opening was made in the common femoral artery just proximal to the origin of the profunda.  There was very sluggish inflow.  A 4 Fogarty catheter was passed proximally up into the aorta.  Upon return, an organized core of thrombus and embolic-looking material was retrieved followed by excellent inflow.  Additional passes yielded one other small piece of debris proximally, but no other debris was retrieved and there was excellent inflow.  The size of the embolus was about 2-3 cm in length.  A Fogarty was then passed down the superficial femoral artery 50 cm where it met resistance.  There was no evidence of any thrombus in the distal vessels or in the superficial  femoral artery or the common femoral artery.  The profunda was also thrombectomized and there was no thrombus within it.  The artery was closed with two continuous 6-0 Prolene sutures.  The clamp was released and there was an excellent pulse in the artery and good Doppler flow in the anterior tibial artery of the foot.  No protamine was given.  The wound was irrigated with saline and closed in layers with Vicryl in a subcuticular fashion.  A sterile dressing was applied.  The patient was taken to the recovery room in satisfactory condition. Dictated by:   Quita Skye Hart [place], M.D. Attending Physician:  Janalyn Rouse DD:  12/31/01 TD:  01/01/02 Job: [id number]

## 2011-01-11 NOTE — Op Note (Signed)
NAME:  Angela [name], Angela [name]                          ACCOUNT NO.:  [id number]   MEDICAL RECORD NO.:  [id number]                   PATIENT TYPE:  AMB   LOCATION:  ENDO                                 FACILITY:  [facility]   PHYSICIAN:  Georgiana Spinner, M.D.                 DATE OF BIRTH:  Sep 04, 1945   DATE OF PROCEDURE:  05/02/2004  DATE OF DISCHARGE:                                 OPERATIVE REPORT   PROCEDURE:  Colonoscopy.   INDICATIONS:  Colon cancer.   ANESTHESIA:  Demerol 40 mg, Versed 4 mg.   PROCEDURE:  With the patient mildly sedated in the left lateral decubitus  position, the Olympus videoscopic colonoscope was inserted in the rectum and  passed under direct vision to the neo-cecum, identified by surgical  anastomosis, which was photographed.  From this point the colonoscope was  slowly withdrawn, taking circumferential views of the colonic mucosa,  suctioning fecal debris as we went until we reached the rectum, which  appeared normal on direct and showed hemorrhoids on retroflex view.  The  endoscope was straightened and withdrawn.  The patient's vital signs and  pulse oximetry remained stable.  The patient tolerated the procedure well  without apparent complications.   FINDINGS:  Diverticulosis of the sigmoid colon, internal hemorrhoids,  otherwise an unremarkable examination to the neo-cecum.   PLAN:  Repeat examination in five years.                                               Georgiana Spinner, M.D.    GMO/MEDQ  D:  05/02/2004  T:  05/02/2004  Job:  [id number]

## 2011-01-11 NOTE — Consult Note (Signed)
[place]. [facility]  Patient:    Angela [name], Angela [name] Visit Number: [id number] MRN: [id number]          Service Type: MED Location: [phone] Attending Physician:  Janalyn Rouse Dictated by:   Quita Skye Hart [place], M.D. Proc. Date: 12/30/01 Admit Date:  12/20/2001   CC:         Fayrene Fearing C. Smitty Cords, M.D.   Consultation Report  REASON FOR CONSULTATION:  Rule out embolus, right leg.  HISTORY OF PRESENT ILLNESS:  I appreciate having the opportunity to see this lady in consultation regarding her acute onset of numbness in the right foot which occurred 24 hours ago.  This 65 year old African American female patient with a long history of a cardiomyopathy, having undergone mitral valve replacement in 1983 by Dr. Andrey Campanile, was admitted for recurrent episodes of fatigue and weakness, generalized, as well as some visual loss in both eyes. While in the hospital and being evaluated 24 hours ago, she developed numbness in the right foot, which she has not experienced in the past.  She denies any history of claudication, stating she could ambulate two to three miles about one to two weeks ago with no leg pain.  She has no history of previous emboli or deep venous thrombosis or other thrombotic problems.  She has been on chronic Coumadin therapy at home and is currently on heparin in the hospital.  PAST MEDICAL HISTORY:  Mitral valve replacement in 1983 with a York-Shiley valve; rheumatic heart disease; pacemaker inserted in 1995, has had atrial fibrillation since 1993; in 1999, had a colon resection for cancer and has had chemotherapy and is now felt to be free of disease; also, previously had a hysterectomy for endometrial cancer; she also has a history of hypertension. Denies diabetes mellitus, deep venous thrombosis, thrombophlebitis, pulmonary emboli, chronic obstructive pulmonary disease.  SOCIAL HISTORY:  Formerly worked in Nurse, learning disability at [employer].  She  does not smoke nor use alcohol.  FAMILY HISTORY:  Her mother is 58 and in good health.  Father died of lung cancer.  MEDICATIONS:  Please see history and physical in chart.  ALLERGIES:  None known.  REVIEW OF SYSTEMS:  The patient denies any fever, chills, night sweats, anorexia or weight loss.  She has had episodes of dizziness now for some time. She denies any PND, orthopnea or dyspnea on exertion but does have nocturia x3-4 every night.  Has no coughing or wheezing and denies any history of asthma or pulmonary infections recently.  She denies any melena, hematemesis or heartburn.  Otherwise, unremarkable.  PHYSICAL EXAMINATION:  VITAL SIGNS:  Blood pressure 110/60.  Heart rate is 80 and regular. Respirations are 14 and unlabored.  GENERAL:  She is a thin but healthy-appearing female patient who is in no apparent distress.  She is alert and oriented x3.  NECK:  Supple with 3+ carotid pulses.  No bruits are audible.  There is no palpable adenopathy in the neck.  NEUROLOGIC:  Exam is normal.  CHEST:  There is a pacemaker present in the right upper infraclavicular area. Chest is clear to auscultation.  CARDIOVASCULAR:  Exam reveals a regular rate and rhythm with heart sounds consistent with a mitral valve replacement.  ABDOMEN:  Soft and nontender with no masses.  EXTREMITIES:  The left leg has 3+ femoral, popliteal and posterior tibial pulses palpable.  The right leg has absent femoral and distal pulses.  There are intact motion and sensation in the right foot  with no calf tenderness or foot discomfort.  LABORATORY AND ACCESSORY DATA:  Doppler studies done in the vascular lab today reveal no flow in the right lower extremity below the femoral level, with some damp monophasic flow in the femoral and superficial femoral arteries and evidence of soft filling defect consistent with an embolus in the right femoral artery.  Left leg is free of disease.  IMPRESSION: 1.  Probable embolus to right femoral artery 24 hours ago. 2. History of cardiomyopathy with ejection fraction of 15%. 3. History of colon cancer, status post hemicolectomy. 4. History of mitral valve replacement for rheumatic heart disease.  RECOMMENDATION: 1. We will correct Coumadin therapy tonight with fresh frozen plasma and    vitamin K. 2. We will keep n.p.o., the patient just ate dinner at 6 p.m. 3. Plan right femoral embolectomy under local anesthesia tomorrow and    hopefully this will be successful in removing this filling defect in the    right femoral artery. Dictated by:   Quita Skye Hart [place], M.D. Attending Physician:  Janalyn Rouse DD:  12/30/01 TD:  01/01/02 Job: [id number]

## 2011-03-14 ENCOUNTER — Other Ambulatory Visit: Payer: Self-pay | Admitting: Internal Medicine

## 2011-03-14 ENCOUNTER — Ambulatory Visit (INDEPENDENT_AMBULATORY_CARE_PROVIDER_SITE_OTHER): Payer: No Typology Code available for payment source | Admitting: *Deleted

## 2011-03-14 DIAGNOSIS — Z95810 Presence of automatic (implantable) cardiac defibrillator: Secondary | ICD-10-CM

## 2011-03-14 DIAGNOSIS — I4901 Ventricular fibrillation: Secondary | ICD-10-CM

## 2011-03-14 DIAGNOSIS — I428 Other cardiomyopathies: Secondary | ICD-10-CM

## 2011-03-14 DIAGNOSIS — I4891 Unspecified atrial fibrillation: Secondary | ICD-10-CM

## 2011-03-14 LAB — REMOTE ICD DEVICE
BAMS-0001: 180 {beats}/min
BATTERY VOLTAGE: 2.83 V
BRDY-0002RV: 70 {beats}/min
BRDY-0004RV: 120 {beats}/min
DEVICE MODEL ICD: 539576
HV IMPEDENCE: 40 Ohm
LV LEAD IMPEDENCE ICD: 290 Ohm
RV LEAD AMPLITUDE: 12 mv
RV LEAD IMPEDENCE ICD: 400 Ohm
TZAT-0001SLOWVT: 1
TZAT-0004SLOWVT: 8
TZAT-0012SLOWVT: 200 ms
TZAT-0013SLOWVT: 2
TZAT-0018SLOWVT: NEGATIVE
TZAT-0019SLOWVT: 7.5 V
TZAT-0020SLOWVT: 1 ms
TZON-0003SLOWVT: 345 ms
TZON-0004SLOWVT: 30
TZON-0005SLOWVT: 6
TZON-0010SLOWVT: 80 ms
TZST-0001SLOWVT: 2
TZST-0001SLOWVT: 3
TZST-0001SLOWVT: 4
TZST-0003SLOWVT: 830 V
TZST-0003SLOWVT: 830 V
TZST-0003SLOWVT: 830 V
VENTRICULAR PACING ICD: 48 pct

## 2011-03-20 NOTE — Progress Notes (Signed)
icd remote check  

## 2011-04-02 ENCOUNTER — Encounter: Payer: Self-pay | Admitting: *Deleted

## 2011-05-02 ENCOUNTER — Other Ambulatory Visit: Payer: Self-pay | Admitting: *Deleted

## 2011-05-02 MED ORDER — DIGOXIN 125 MCG PO TABS: ORAL_TABLET | ORAL | Status: DC

## 2011-05-09 ENCOUNTER — Other Ambulatory Visit: Payer: Self-pay | Admitting: Internal Medicine

## 2011-05-09 DIAGNOSIS — Z1231 Encounter for screening mammogram for malignant neoplasm of breast: Secondary | ICD-10-CM

## 2011-05-22 LAB — COMPREHENSIVE METABOLIC PANEL
ALT: 106 — ABNORMAL HIGH
AST: 142 — ABNORMAL HIGH
Albumin: 3.3 — ABNORMAL LOW
Alkaline Phosphatase: 63
BUN: 13
CO2: 15 — ABNORMAL LOW
Calcium: 8.3 — ABNORMAL LOW
Chloride: 105
Creatinine, Ser: 1.04
GFR calc Af Amer: >60
GFR calc non Af Amer: 54 — ABNORMAL LOW
Glucose, Bld: 220 — ABNORMAL HIGH
Potassium: 5.9 — ABNORMAL HIGH
Sodium: 134 — ABNORMAL LOW
Total Bilirubin: 2 — ABNORMAL HIGH
Total Protein: 6.1

## 2011-05-22 LAB — BASIC METABOLIC PANEL
BUN: 12
BUN: 13
BUN: 13
BUN: 14
BUN: 15
CO2: 17 — ABNORMAL LOW
CO2: 18 — ABNORMAL LOW
CO2: 18 — ABNORMAL LOW
CO2: 20
CO2: 20
Calcium: 7.4 — ABNORMAL LOW
Calcium: 7.4 — ABNORMAL LOW
Calcium: 7.8 — ABNORMAL LOW
Calcium: 7.9 — ABNORMAL LOW
Calcium: 7.9 — ABNORMAL LOW
Chloride: 110
Chloride: 111
Chloride: 111
Chloride: 112
Chloride: 112
Creatinine, Ser: 0.69
Creatinine, Ser: 0.69
Creatinine, Ser: 0.74
Creatinine, Ser: 0.77
Creatinine, Ser: 0.93
GFR calc Af Amer: >60
GFR calc Af Amer: >60
GFR calc Af Amer: >60
GFR calc Af Amer: >60
GFR calc Af Amer: >60
GFR calc non Af Amer: >60
GFR calc non Af Amer: >60
GFR calc non Af Amer: >60
GFR calc non Af Amer: >60
GFR calc non Af Amer: >60
Glucose, Bld: 129 — ABNORMAL HIGH
Glucose, Bld: 185 — ABNORMAL HIGH
Glucose, Bld: 217 — ABNORMAL HIGH
Glucose, Bld: 223 — ABNORMAL HIGH
Glucose, Bld: 230 — ABNORMAL HIGH
Potassium: 3.2 — ABNORMAL LOW
Potassium: 3.8
Potassium: 3.9
Potassium: 4.1
Potassium: 4.2
Sodium: 136
Sodium: 137
Sodium: 138
Sodium: 138
Sodium: 138

## 2011-05-22 LAB — BLOOD GAS, ARTERIAL
Acid-base deficit: 7.6 — ABNORMAL HIGH
Bicarbonate: 17 — ABNORMAL LOW
FIO2: 1
MECHVT: 450
O2 Saturation: 99.7
PEEP: 5
Patient temperature: 98.6
RATE: 14
TCO2: 17.9
pCO2 arterial: 31.6 — ABNORMAL LOW
pH, Arterial: 7.35
pO2, Arterial: 386 — ABNORMAL HIGH

## 2011-05-22 LAB — CARDIAC PANEL(CRET KIN+CKTOT+MB+TROPI)
CK, MB: 97.5 — ABNORMAL HIGH
Relative Index: 5.4 — ABNORMAL HIGH
Total CK: 1789 — ABNORMAL HIGH
Troponin I: 3.66

## 2011-05-22 LAB — POCT I-STAT 3, ART BLOOD GAS (G3+)
Acid-base deficit: 9 — ABNORMAL HIGH
Bicarbonate: 17.6 — ABNORMAL LOW
O2 Saturation: 100
Operator id: 284251
Patient temperature: 37
TCO2: 19
pCO2 arterial: 38.4
pH, Arterial: 7.269 — ABNORMAL LOW
pO2, Arterial: 468 — ABNORMAL HIGH

## 2011-05-22 LAB — CK TOTAL AND CKMB (NOT AT ARMC)
CK, MB: 5.1 — ABNORMAL HIGH
Relative Index: 2.9 — ABNORMAL HIGH
Total CK: 178 — ABNORMAL HIGH

## 2011-05-22 LAB — B-NATRIURETIC PEPTIDE (CONVERTED LAB): Pro B Natriuretic peptide (BNP): 277 — ABNORMAL HIGH

## 2011-05-22 LAB — MAGNESIUM: Magnesium: 1.7

## 2011-05-22 LAB — PROTIME-INR
INR: 3.1 — ABNORMAL HIGH
INR: 3.6 — ABNORMAL HIGH
Prothrombin Time: 33 — ABNORMAL HIGH
Prothrombin Time: 37.4 — ABNORMAL HIGH

## 2011-05-22 LAB — POCT CARDIAC MARKERS
CKMB, poc: 4.1
Myoglobin, poc: >500
Operator id: 265201
Troponin i, poc: <0.05

## 2011-05-22 LAB — APTT
aPTT: 39 — ABNORMAL HIGH
aPTT: 54 — ABNORMAL HIGH

## 2011-05-22 LAB — TROPONIN I: Troponin I: 0.18 — ABNORMAL HIGH

## 2011-05-22 LAB — DIGOXIN LEVEL: Digoxin Level: 0.4 — ABNORMAL LOW

## 2011-05-22 LAB — PHOSPHORUS: Phosphorus: 3.7

## 2011-05-22 LAB — LACTIC ACID, PLASMA: Lactic Acid, Venous: 7.4 — ABNORMAL HIGH

## 2011-05-23 LAB — COMPREHENSIVE METABOLIC PANEL
ALT: 113 — ABNORMAL HIGH
ALT: 118 — ABNORMAL HIGH
ALT: 65 — ABNORMAL HIGH
ALT: 67 — ABNORMAL HIGH
ALT: 88 — ABNORMAL HIGH
AST: 107 — ABNORMAL HIGH
AST: 158 — ABNORMAL HIGH
AST: 180 — ABNORMAL HIGH
AST: 64 — ABNORMAL HIGH
AST: 65 — ABNORMAL HIGH
Albumin: 2.2 — ABNORMAL LOW
Albumin: 2.3 — ABNORMAL LOW
Albumin: 2.7 — ABNORMAL LOW
Albumin: 2.7 — ABNORMAL LOW
Albumin: 3 — ABNORMAL LOW
Alkaline Phosphatase: 58
Alkaline Phosphatase: 59
Alkaline Phosphatase: 59
Alkaline Phosphatase: 60
Alkaline Phosphatase: 63
BUN: 10
BUN: 11
BUN: 13
BUN: 8
BUN: 8
CO2: 18 — ABNORMAL LOW
CO2: 20
CO2: 21
CO2: 22
CO2: 24
Calcium: 7.9 — ABNORMAL LOW
Calcium: 8.1 — ABNORMAL LOW
Calcium: 8.2 — ABNORMAL LOW
Calcium: 8.3 — ABNORMAL LOW
Calcium: 8.5
Chloride: 104
Chloride: 105
Chloride: 106
Chloride: 109
Chloride: 114 — ABNORMAL HIGH
Creatinine, Ser: 0.55
Creatinine, Ser: 0.59
Creatinine, Ser: 0.66
Creatinine, Ser: 0.69
Creatinine, Ser: 0.74
GFR calc Af Amer: >60
GFR calc Af Amer: >60
GFR calc Af Amer: >60
GFR calc Af Amer: >60
GFR calc Af Amer: >60
GFR calc non Af Amer: >60
GFR calc non Af Amer: >60
GFR calc non Af Amer: >60
GFR calc non Af Amer: >60
GFR calc non Af Amer: >60
Glucose, Bld: 110 — ABNORMAL HIGH
Glucose, Bld: 149 — ABNORMAL HIGH
Glucose, Bld: 172 — ABNORMAL HIGH
Glucose, Bld: 200 — ABNORMAL HIGH
Glucose, Bld: 94
Potassium: 3.3 — ABNORMAL LOW
Potassium: 3.5
Potassium: 3.8
Potassium: 3.9
Potassium: 4.1
Sodium: 134 — ABNORMAL LOW
Sodium: 134 — ABNORMAL LOW
Sodium: 136
Sodium: 139
Sodium: 140
Total Bilirubin: 0.8
Total Bilirubin: 0.9
Total Bilirubin: 1.3 — ABNORMAL HIGH
Total Bilirubin: 1.7 — ABNORMAL HIGH
Total Bilirubin: 2 — ABNORMAL HIGH
Total Protein: 5.3 — ABNORMAL LOW
Total Protein: 5.4 — ABNORMAL LOW
Total Protein: 5.5 — ABNORMAL LOW
Total Protein: 5.7 — ABNORMAL LOW
Total Protein: 6

## 2011-05-23 LAB — CBC
HCT: 29.6 — ABNORMAL LOW
HCT: 30.2 — ABNORMAL LOW
HCT: 30.3 — ABNORMAL LOW
HCT: 30.4 — ABNORMAL LOW
HCT: 30.5 — ABNORMAL LOW
HCT: 31.1 — ABNORMAL LOW
HCT: 31.2 — ABNORMAL LOW
HCT: 31.5 — ABNORMAL LOW
HCT: 32.2 — ABNORMAL LOW
HCT: 32.3 — ABNORMAL LOW
HCT: 33 — ABNORMAL LOW
HCT: 35.6 — ABNORMAL LOW
HCT: 37.2
HCT: 38.1
HCT: 30.1 — ABNORMAL LOW
HCT: 31.1 — ABNORMAL LOW
HCT: 31.3 — ABNORMAL LOW
HCT: 34.2 — ABNORMAL LOW
HCT: 34.3 — ABNORMAL LOW
HCT: 34.4 — ABNORMAL LOW
Hemoglobin: 10.2 — ABNORMAL LOW
Hemoglobin: 10.3 — ABNORMAL LOW
Hemoglobin: 10.3 — ABNORMAL LOW
Hemoglobin: 10.4 — ABNORMAL LOW
Hemoglobin: 10.4 — ABNORMAL LOW
Hemoglobin: 10.5 — ABNORMAL LOW
Hemoglobin: 10.8 — ABNORMAL LOW
Hemoglobin: 10.8 — ABNORMAL LOW
Hemoglobin: 11.1 — ABNORMAL LOW
Hemoglobin: 11.1 — ABNORMAL LOW
Hemoglobin: 11.6 — ABNORMAL LOW
Hemoglobin: 12
Hemoglobin: 12.8
Hemoglobin: 13.1
Hemoglobin: 10.4 — ABNORMAL LOW
Hemoglobin: 10.6 — ABNORMAL LOW
Hemoglobin: 11.3 — ABNORMAL LOW
Hemoglobin: 11.3 — ABNORMAL LOW
Hemoglobin: 11.6 — ABNORMAL LOW
Hemoglobin: 9.9 — ABNORMAL LOW
MCHC: 33.5
MCHC: 33.6
MCHC: 33.6
MCHC: 33.8
MCHC: 34.2
MCHC: 34.3
MCHC: 34.3
MCHC: 34.3
MCHC: 34.5
MCHC: 34.5
MCHC: 34.6
MCHC: 34.6
MCHC: 34.8
MCHC: 35
MCHC: 33
MCHC: 33
MCHC: 33.2
MCHC: 33.4
MCHC: 33.7
MCHC: 33.8
MCV: 89
MCV: 89.6
MCV: 89.7
MCV: 89.8
MCV: 90
MCV: 90.1
MCV: 90.1
MCV: 90.2
MCV: 90.2
MCV: 90.3
MCV: 90.6
MCV: 90.6
MCV: 90.7
MCV: 91.3
MCV: 91.1
MCV: 91.2
MCV: 91.8
MCV: 91.8
MCV: 91.8
MCV: 91.9
Platelets: 116 — ABNORMAL LOW
Platelets: 119 — ABNORMAL LOW
Platelets: 132 — ABNORMAL LOW
Platelets: 158
Platelets: 166
Platelets: 166
Platelets: 172
Platelets: 206
Platelets: 247
Platelets: 298
Platelets: 299
Platelets: 304
Platelets: 315
Platelets: 359
Platelets: 237
Platelets: 310
Platelets: 399
Platelets: 406 — ABNORMAL HIGH
Platelets: 409 — ABNORMAL HIGH
Platelets: 412 — ABNORMAL HIGH
RBC: 3.3 — ABNORMAL LOW
RBC: 3.33 — ABNORMAL LOW
RBC: 3.33 — ABNORMAL LOW
RBC: 3.38 — ABNORMAL LOW
RBC: 3.39 — ABNORMAL LOW
RBC: 3.43 — ABNORMAL LOW
RBC: 3.47 — ABNORMAL LOW
RBC: 3.48 — ABNORMAL LOW
RBC: 3.57 — ABNORMAL LOW
RBC: 3.6 — ABNORMAL LOW
RBC: 3.71 — ABNORMAL LOW
RBC: 3.96
RBC: 4.12
RBC: 4.23
RBC: 3.28 — ABNORMAL LOW
RBC: 3.39 — ABNORMAL LOW
RBC: 3.43 — ABNORMAL LOW
RBC: 3.72 — ABNORMAL LOW
RBC: 3.73 — ABNORMAL LOW
RBC: 3.78 — ABNORMAL LOW
RDW: 14.9
RDW: 14.9
RDW: 14.9
RDW: 15.1
RDW: 15.1
RDW: 15.1
RDW: 15.1
RDW: 15.3
RDW: 15.3
RDW: 15.3
RDW: 15.3
RDW: 15.3
RDW: 15.4
RDW: 15.7 — ABNORMAL HIGH
RDW: 15.9 — ABNORMAL HIGH
RDW: 16.3 — ABNORMAL HIGH
RDW: 16.6 — ABNORMAL HIGH
RDW: 16.9 — ABNORMAL HIGH
RDW: 17.3 — ABNORMAL HIGH
RDW: 17.6 — ABNORMAL HIGH
WBC: 10.8 — ABNORMAL HIGH
WBC: 10.9 — ABNORMAL HIGH
WBC: 12 — ABNORMAL HIGH
WBC: 12.1 — ABNORMAL HIGH
WBC: 12.8 — ABNORMAL HIGH
WBC: 12.8 — ABNORMAL HIGH
WBC: 13.5 — ABNORMAL HIGH
WBC: 14.6 — ABNORMAL HIGH
WBC: 15.8 — ABNORMAL HIGH
WBC: 17.7 — ABNORMAL HIGH
WBC: 17.7 — ABNORMAL HIGH
WBC: 8.6
WBC: 9.4
WBC: 9.5
WBC: 4.8
WBC: 5.8
WBC: 5.9
WBC: 5.9
WBC: 6.4
WBC: 7.7

## 2011-05-23 LAB — BLOOD GAS, ARTERIAL
Acid-Base Excess: 8.3 — ABNORMAL HIGH
Acid-base deficit: 0.3
Acid-base deficit: 0.3
Acid-base deficit: 2.1 — ABNORMAL HIGH
Acid-base deficit: 2.3 — ABNORMAL HIGH
Acid-base deficit: 3.6 — ABNORMAL HIGH
Acid-base deficit: 4.1 — ABNORMAL HIGH
Acid-base deficit: 4.9 — ABNORMAL HIGH
Acid-base deficit: 5.3 — ABNORMAL HIGH
Acid-base deficit: 5.7 — ABNORMAL HIGH
Bicarbonate: 17.6 — ABNORMAL LOW
Bicarbonate: 18.3 — ABNORMAL LOW
Bicarbonate: 18.6 — ABNORMAL LOW
Bicarbonate: 19.7 — ABNORMAL LOW
Bicarbonate: 20
Bicarbonate: 21.2
Bicarbonate: 21.4
Bicarbonate: 22.9
Bicarbonate: 23.1
Bicarbonate: 31.7 — ABNORMAL HIGH
Drawn by: 22430
Drawn by: 224301
Drawn by: 270211
Drawn by: 270221
Drawn by: 275531
FIO2: 0.4
FIO2: 0.4
FIO2: 0.4
FIO2: 0.4
FIO2: 0.5
FIO2: 0.5
FIO2: 30
FIO2: 30
FIO2: 40
MECHVT: 450
MECHVT: 450
MECHVT: 450
MECHVT: 450
MECHVT: 450
MECHVT: 450
MECHVT: 450
MECHVT: 450
Mode: POSITIVE
O2 Content: 4
O2 Saturation: 84.1
O2 Saturation: 89.3
O2 Saturation: 94.9
O2 Saturation: 96.7
O2 Saturation: 97.2
O2 Saturation: 98.5
O2 Saturation: 98.5
O2 Saturation: 99.1
O2 Saturation: 99.3
O2 Saturation: 99.4
PEEP: 5
PEEP: 5
PEEP: 5
PEEP: 5
PEEP: 5
PEEP: 5
PEEP: 5
PEEP: 5
PEEP: 5
Patient temperature: 91.4
Patient temperature: 98.6
Patient temperature: 98.6
Patient temperature: 98.6
Patient temperature: 98.6
Patient temperature: 98.6
Patient temperature: 98.6
Patient temperature: 98.6
Patient temperature: 99.3
Patient temperature: 99.6
Pressure support: 5
RATE: 12
RATE: 12
RATE: 12
RATE: 12
RATE: 12
RATE: 12
RATE: 14
RATE: 14
TCO2: 18.3
TCO2: 19
TCO2: 19.6
TCO2: 20.9
TCO2: 20.9
TCO2: 22.2
TCO2: 22.4
TCO2: 23.9
TCO2: 24.1
TCO2: 32.9
pCO2 arterial: 22.3 — ABNORMAL LOW
pCO2 arterial: 22.5 — ABNORMAL LOW
pCO2 arterial: 25.2 — ABNORMAL LOW
pCO2 arterial: 30.8 — ABNORMAL LOW
pCO2 arterial: 32.3 — ABNORMAL LOW
pCO2 arterial: 32.8 — ABNORMAL LOW
pCO2 arterial: 33.2 — ABNORMAL LOW
pCO2 arterial: 33.3 — ABNORMAL LOW
pCO2 arterial: 38.7
pCO2 arterial: 40.2
pH, Arterial: 7.326 — ABNORMAL LOW
pH, Arterial: 7.367
pH, Arterial: 7.43 — ABNORMAL HIGH
pH, Arterial: 7.454 — ABNORMAL HIGH
pH, Arterial: 7.456 — ABNORMAL HIGH
pH, Arterial: 7.466 — ABNORMAL HIGH
pH, Arterial: 7.491 — ABNORMAL HIGH
pH, Arterial: 7.509 — ABNORMAL HIGH
pH, Arterial: 7.511 — ABNORMAL HIGH
pH, Arterial: 7.522 — ABNORMAL HIGH
pO2, Arterial: 132 — ABNORMAL HIGH
pO2, Arterial: 147 — ABNORMAL HIGH
pO2, Arterial: 154 — ABNORMAL HIGH
pO2, Arterial: 157 — ABNORMAL HIGH
pO2, Arterial: 184 — ABNORMAL HIGH
pO2, Arterial: 42.9 — ABNORMAL LOW
pO2, Arterial: 51.1 — ABNORMAL LOW
pO2, Arterial: 73.1 — ABNORMAL LOW
pO2, Arterial: 87.1
pO2, Arterial: 87.5

## 2011-05-23 LAB — BASIC METABOLIC PANEL
BUN: 10
BUN: 10
BUN: 11
BUN: 12
BUN: 13
BUN: 14
BUN: 14
BUN: 15
BUN: 15
BUN: 16
BUN: 18
BUN: 8
BUN: 8
BUN: 9
BUN: 13
BUN: 13
BUN: 17
BUN: 18
CO2: 19
CO2: 20
CO2: 20
CO2: 20
CO2: 21
CO2: 21
CO2: 27
CO2: 31
CO2: 32
CO2: 32
CO2: 33 — ABNORMAL HIGH
CO2: 33 — ABNORMAL HIGH
CO2: 35 — ABNORMAL HIGH
CO2: 35 — ABNORMAL HIGH
CO2: 26
CO2: 27
CO2: 27
CO2: 28
Calcium: 7.9 — ABNORMAL LOW
Calcium: 8 — ABNORMAL LOW
Calcium: 8.1 — ABNORMAL LOW
Calcium: 8.3 — ABNORMAL LOW
Calcium: 8.3 — ABNORMAL LOW
Calcium: 8.3 — ABNORMAL LOW
Calcium: 8.4
Calcium: 8.5
Calcium: 8.5
Calcium: 8.5
Calcium: 8.6
Calcium: 8.9
Calcium: 9
Calcium: 9
Calcium: 8.8
Calcium: 8.8
Calcium: 8.9
Calcium: 8.9
Chloride: 102
Chloride: 103
Chloride: 104
Chloride: 108
Chloride: 110
Chloride: 110
Chloride: 110
Chloride: 111
Chloride: 112
Chloride: 96
Chloride: 96
Chloride: 96
Chloride: 97
Chloride: 98
Chloride: 100
Chloride: 100
Chloride: 105
Chloride: 105
Creatinine, Ser: 0.55
Creatinine, Ser: 0.57
Creatinine, Ser: 0.58
Creatinine, Ser: 0.58
Creatinine, Ser: 0.61
Creatinine, Ser: 0.62
Creatinine, Ser: 0.63
Creatinine, Ser: 0.64
Creatinine, Ser: 0.7
Creatinine, Ser: 0.71
Creatinine, Ser: 0.72
Creatinine, Ser: 0.74
Creatinine, Ser: 0.78
Creatinine, Ser: 0.83
Creatinine, Ser: 0.63
Creatinine, Ser: 0.67
Creatinine, Ser: 0.68
Creatinine, Ser: 0.7
GFR calc Af Amer: >60
GFR calc Af Amer: >60
GFR calc Af Amer: >60
GFR calc Af Amer: >60
GFR calc Af Amer: >60
GFR calc Af Amer: >60
GFR calc Af Amer: >60
GFR calc Af Amer: >60
GFR calc Af Amer: >60
GFR calc Af Amer: >60
GFR calc Af Amer: >60
GFR calc Af Amer: >60
GFR calc Af Amer: >60
GFR calc Af Amer: >60
GFR calc Af Amer: >60
GFR calc Af Amer: >60
GFR calc Af Amer: >60
GFR calc Af Amer: >60
GFR calc non Af Amer: >60
GFR calc non Af Amer: >60
GFR calc non Af Amer: >60
GFR calc non Af Amer: >60
GFR calc non Af Amer: >60
GFR calc non Af Amer: >60
GFR calc non Af Amer: >60
GFR calc non Af Amer: >60
GFR calc non Af Amer: >60
GFR calc non Af Amer: >60
GFR calc non Af Amer: >60
GFR calc non Af Amer: >60
GFR calc non Af Amer: >60
GFR calc non Af Amer: >60
GFR calc non Af Amer: >60
GFR calc non Af Amer: >60
GFR calc non Af Amer: >60
GFR calc non Af Amer: >60
Glucose, Bld: 103 — ABNORMAL HIGH
Glucose, Bld: 104 — ABNORMAL HIGH
Glucose, Bld: 106 — ABNORMAL HIGH
Glucose, Bld: 106 — ABNORMAL HIGH
Glucose, Bld: 109 — ABNORMAL HIGH
Glucose, Bld: 110 — ABNORMAL HIGH
Glucose, Bld: 120 — ABNORMAL HIGH
Glucose, Bld: 123 — ABNORMAL HIGH
Glucose, Bld: 123 — ABNORMAL HIGH
Glucose, Bld: 132 — ABNORMAL HIGH
Glucose, Bld: 161 — ABNORMAL HIGH
Glucose, Bld: 77
Glucose, Bld: 96
Glucose, Bld: 99
Glucose, Bld: 89
Glucose, Bld: 98
Glucose, Bld: 99
Glucose, Bld: 99
Potassium: 3 — ABNORMAL LOW
Potassium: 3.2 — ABNORMAL LOW
Potassium: 3.4 — ABNORMAL LOW
Potassium: 3.4 — ABNORMAL LOW
Potassium: 3.6
Potassium: 3.6
Potassium: 3.7
Potassium: 3.8
Potassium: 3.8
Potassium: 3.9
Potassium: 3.9
Potassium: 4
Potassium: 4.1
Potassium: 4.3
Potassium: 3.8
Potassium: 3.9
Potassium: 4
Potassium: 4.1
Sodium: 136
Sodium: 136
Sodium: 136
Sodium: 138
Sodium: 138
Sodium: 138
Sodium: 138
Sodium: 138
Sodium: 139
Sodium: 139
Sodium: 139
Sodium: 140
Sodium: 141
Sodium: 144
Sodium: 133 — ABNORMAL LOW
Sodium: 137
Sodium: 138
Sodium: 138

## 2011-05-23 LAB — PROTIME-INR
INR: 1.1
INR: 1.1
INR: 1.1
INR: 1.1
INR: 1.2
INR: 1.2
INR: 1.3
INR: 1.4
INR: 1.4
INR: 1.6 — ABNORMAL HIGH
INR: 1.7 — ABNORMAL HIGH
INR: 1.8 — ABNORMAL HIGH
INR: 1.8 — ABNORMAL HIGH
INR: 4.3 — ABNORMAL HIGH
INR: 4.7 — ABNORMAL HIGH
INR: 5.2
INR: 5.4
INR: 1.4
INR: 1.5
INR: 1.5
INR: 2.3 — ABNORMAL HIGH
INR: 2.4 — ABNORMAL HIGH
INR: 2.6 — ABNORMAL HIGH
INR: 2.6 — ABNORMAL HIGH
INR: 2.7 — ABNORMAL HIGH
INR: 2.7 — ABNORMAL HIGH
INR: 2.9 — ABNORMAL HIGH
INR: 3.4 — ABNORMAL HIGH
Prothrombin Time: 13.9
Prothrombin Time: 14
Prothrombin Time: 14
Prothrombin Time: 14.5
Prothrombin Time: 15.2
Prothrombin Time: 15.8 — ABNORMAL HIGH
Prothrombin Time: 16.1 — ABNORMAL HIGH
Prothrombin Time: 17.4 — ABNORMAL HIGH
Prothrombin Time: 17.6 — ABNORMAL HIGH
Prothrombin Time: 19 — ABNORMAL HIGH
Prothrombin Time: 20.4 — ABNORMAL HIGH
Prothrombin Time: 21.2 — ABNORMAL HIGH
Prothrombin Time: 21.7 — ABNORMAL HIGH
Prothrombin Time: 42.6 — ABNORMAL HIGH
Prothrombin Time: 45.8 — ABNORMAL HIGH
Prothrombin Time: 50 — ABNORMAL HIGH
Prothrombin Time: 51.9 — ABNORMAL HIGH
Prothrombin Time: 17.8 — ABNORMAL HIGH
Prothrombin Time: 18.9 — ABNORMAL HIGH
Prothrombin Time: 18.9 — ABNORMAL HIGH
Prothrombin Time: 25.7 — ABNORMAL HIGH
Prothrombin Time: 26.5 — ABNORMAL HIGH
Prothrombin Time: 28.5 — ABNORMAL HIGH
Prothrombin Time: 29.1 — ABNORMAL HIGH
Prothrombin Time: 29.2 — ABNORMAL HIGH
Prothrombin Time: 29.8 — ABNORMAL HIGH
Prothrombin Time: 31.1 — ABNORMAL HIGH
Prothrombin Time: 35.8 — ABNORMAL HIGH

## 2011-05-23 LAB — CULTURE, BLOOD (ROUTINE X 2)
Culture: NO GROWTH
Culture: NO GROWTH

## 2011-05-23 LAB — BODY FLUID CELL COUNT WITH DIFFERENTIAL
Eos, Fluid: 0
Lymphs, Fluid: 4
Monocyte-Macrophage-Serous Fluid: 4 — ABNORMAL LOW
Neutrophil Count, Fluid: 92 — ABNORMAL HIGH
Total Nucleated Cell Count, Fluid: 3713 — ABNORMAL HIGH

## 2011-05-23 LAB — MAGNESIUM
Magnesium: 1.6
Magnesium: 1.8
Magnesium: 1.8
Magnesium: 1.8
Magnesium: 1.9
Magnesium: 1.9
Magnesium: 1.9
Magnesium: 1.9
Magnesium: 2

## 2011-05-23 LAB — CARDIAC PANEL(CRET KIN+CKTOT+MB+TROPI)
CK, MB: 1.3
CK, MB: 1.4
CK, MB: 1.6
CK, MB: 135.4 — ABNORMAL HIGH
Relative Index: 3.6 — ABNORMAL HIGH
Relative Index: INVALID
Relative Index: INVALID
Relative Index: INVALID
Total CK: 35
Total CK: 3723 — ABNORMAL HIGH
Total CK: 39
Total CK: 39
Troponin I: 0.2 — ABNORMAL HIGH
Troponin I: 0.21 — ABNORMAL HIGH
Troponin I: 0.23 — ABNORMAL HIGH
Troponin I: 5.23

## 2011-05-23 LAB — URINALYSIS, ROUTINE W REFLEX MICROSCOPIC
Bilirubin Urine: NEGATIVE
Glucose, UA: NEGATIVE
Ketones, ur: NEGATIVE
Nitrite: NEGATIVE
Protein, ur: NEGATIVE
Specific Gravity, Urine: 1.011
Urobilinogen, UA: 1
pH: 6

## 2011-05-23 LAB — CULTURE, BAL-QUANTITATIVE W GRAM STAIN: Colony Count: 3000

## 2011-05-23 LAB — HEPARIN LEVEL (UNFRACTIONATED)
Heparin Unfractionated: 0.12 — ABNORMAL LOW
Heparin Unfractionated: 0.18 — ABNORMAL LOW
Heparin Unfractionated: 0.18 — ABNORMAL LOW
Heparin Unfractionated: 0.18 — ABNORMAL LOW
Heparin Unfractionated: 0.22 — ABNORMAL LOW
Heparin Unfractionated: 0.24 — ABNORMAL LOW
Heparin Unfractionated: 0.26 — ABNORMAL LOW
Heparin Unfractionated: 0.28 — ABNORMAL LOW
Heparin Unfractionated: 0.29 — ABNORMAL LOW
Heparin Unfractionated: 0.37
Heparin Unfractionated: 0.38
Heparin Unfractionated: 0.44
Heparin Unfractionated: 0.45
Heparin Unfractionated: 0.46
Heparin Unfractionated: 0.46
Heparin Unfractionated: 0.57
Heparin Unfractionated: 0.65
Heparin Unfractionated: 1.17 — ABNORMAL HIGH
Heparin Unfractionated: 0.25 — ABNORMAL LOW
Heparin Unfractionated: 0.36
Heparin Unfractionated: 0.45
Heparin Unfractionated: 0.54

## 2011-05-23 LAB — OCCULT BLOOD X 1 CARD TO LAB, STOOL: Fecal Occult Bld: NEGATIVE

## 2011-05-23 LAB — URINE MICROSCOPIC-ADD ON

## 2011-05-23 LAB — PHOSPHORUS
Phosphorus: 2 — ABNORMAL LOW
Phosphorus: 2.1 — ABNORMAL LOW
Phosphorus: 2.5
Phosphorus: 2.6
Phosphorus: 2.9
Phosphorus: 2.9
Phosphorus: 2.9
Phosphorus: 4.6
Phosphorus: <1 — CL

## 2011-05-23 LAB — CLOSTRIDIUM DIFFICILE EIA
C difficile Toxins A+B, EIA: NEGATIVE
C difficile Toxins A+B, EIA: NEGATIVE

## 2011-05-23 LAB — URINE CULTURE: Colony Count: >=100000

## 2011-05-23 LAB — APTT
aPTT: 124 — ABNORMAL HIGH
aPTT: 59 — ABNORMAL HIGH
aPTT: 64 — ABNORMAL HIGH
aPTT: 73 — ABNORMAL HIGH
aPTT: >200

## 2011-05-23 LAB — DIFFERENTIAL
Basophils Absolute: 0.1
Basophils Relative: 1
Eosinophils Absolute: 0.1
Eosinophils Relative: 1
Lymphocytes Relative: 16
Lymphs Abs: 0.9
Monocytes Absolute: 0.6
Monocytes Relative: 11
Neutro Abs: 4.1
Neutrophils Relative %: 71

## 2011-05-23 LAB — AFB CULTURE WITH SMEAR (NOT AT ARMC): Acid Fast Smear: NONE SEEN

## 2011-05-23 LAB — CORTISOL: Cortisol, Plasma: 23.9

## 2011-05-23 LAB — LACTIC ACID, PLASMA: Lactic Acid, Venous: 1.1

## 2011-06-12 ENCOUNTER — Ambulatory Visit
Admission: RE | Admit: 2011-06-12 | Discharge: 2011-06-12 | Disposition: A | Discharge status: Home / Self Care | Payer: No Typology Code available for payment source | Source: Ambulatory Visit | Attending: Internal Medicine | Admitting: Internal Medicine

## 2011-06-12 DIAGNOSIS — Z1231 Encounter for screening mammogram for malignant neoplasm of breast: Secondary | ICD-10-CM

## 2011-06-17 ENCOUNTER — Other Ambulatory Visit: Payer: Self-pay

## 2011-06-17 MED ORDER — FUROSEMIDE 40 MG PO TABS: 40.0000 mg | ORAL_TABLET | Freq: Every day | ORAL | Status: DC

## 2011-06-26 ENCOUNTER — Encounter: Payer: Self-pay | Admitting: Internal Medicine

## 2011-06-26 ENCOUNTER — Ambulatory Visit (INDEPENDENT_AMBULATORY_CARE_PROVIDER_SITE_OTHER): Payer: No Typology Code available for payment source | Admitting: Internal Medicine

## 2011-06-26 DIAGNOSIS — Z95810 Presence of automatic (implantable) cardiac defibrillator: Secondary | ICD-10-CM

## 2011-06-26 DIAGNOSIS — I4891 Unspecified atrial fibrillation: Secondary | ICD-10-CM

## 2011-06-26 DIAGNOSIS — I428 Other cardiomyopathies: Secondary | ICD-10-CM

## 2011-06-26 DIAGNOSIS — I5022 Chronic systolic (congestive) heart failure: Secondary | ICD-10-CM

## 2011-06-26 NOTE — Patient Instructions (Signed)
Remote monitoring is used to monitor your Pacemaker of ICD from home. This monitoring reduces the number of office visits required to check your device to one time per year. It allows us to keep an eye on the functioning of your device to ensure it is working properly. You are scheduled for a device check from home on 09/26/11. You may send your transmission at any time that day. If you have a wireless device, the transmission will be sent automatically. After your physician reviews your transmission, you will receive a postcard with your next transmission date.   Your physician wants you to follow-up in: 1 year with Dr. Taylor. You will receive a reminder letter in the mail two months in advance. If you don't receive a letter, please call our office to schedule the follow-up appointment.   Your physician recommends that you continue on your current medications as directed. Please refer to the Current Medication list given to you today.  

## 2011-06-28 ENCOUNTER — Encounter: Payer: Self-pay | Admitting: Internal Medicine

## 2011-06-28 NOTE — Assessment & Plan Note (Addendum)
ICD interogation demonstrates that she has been in atrial fib less than one percent of the time. Her ventricular rates have not been well controlled despite her being asymptomatic. WE may ultimately require AV node ablation but for now will hold off on this procedure.

## 2011-06-28 NOTE — Progress Notes (Addendum)
HPI [name] returns today for followup. She has a longstanding DCM, Chronic systolic CHF, s/p ICD after surviving a VF arrest in 2009. She also has a h/o colon CA and atrial fibrillation which has been difficult to control.  She has done reasonably well in the interim. She denies c/p or ICD shock. Minimal peripheral edema. No Known Allergies   Current Outpatient Prescriptions  Medication Sig Dispense Refill  . carvedilol (COREG) 12.5 MG tablet Take 12.5 mg by mouth 2 (two) times daily with a meal.        . digoxin (LANOXIN) 0.125 MG tablet Take one half tablet daily by mouth  30 tablet  1  . furosemide (LASIX) 40 MG tablet Take 1 tablet (40 mg total) by mouth daily.  30 tablet  3  . potassium chloride (KLOR-CON) 10 MEQ CR tablet Take 10 mEq by mouth daily.        [name] warfarin (COUMADIN) 1 MG tablet Take 1 mg by mouth as directed.           Past Medical History  Diagnosis Date  . Colon cancer     colon ca dx 2010  . Cardiomyopathy   . Atrial fibrillation   . Diverticular disease   . S/P implantation of automatic cardioverter/defibrillator (AICD)   . Encounter for long-term (current) use of anticoagulants   . Carcinoma of colon   . History of colon cancer 1999    T3, N1  chemotherapy  . Internal hemorrhoids     ROS:   All systems reviewed and negative except as noted in the HPI.   Past Surgical History  Procedure Date  . Mitral valve replacement, bjork-shiley valve 1983  . Biventricular aicd   . Colon surgery 1999  . History of echocardiogram 2003, 2007, 2009     Family History  Problem Relation Age of Onset  . Diabetes Mother 53  . Cancer Father 52    Lung Cancer and smoker     History   Social History  . Marital Status: Married    Spouse Name: N/A    Number of Children: 1  . Years of Education: N/A   Occupational History  . Retired    Social History Main Topics  . Smoking status: Never Smoker   . Smokeless tobacco: Not on file  . Alcohol Use: No  .  Drug Use: Not on file  . Sexually Active: Not on file   Other Topics Concern  . Not on file   Social History Narrative   The patient is married and has 1 child.  She is retired  from working at [employer].  She does not smoke or drink     BP 112/80  Pulse 87  Ht 5\' 6"  (1.676 m)  Wt 136 lb 12.8 oz (62.052 kg)  BMI 22.08 kg/m2  Physical Exam:  Well appearing NAD HEENT: Unremarkable Neck:  No JVD, no thyromegally Lymphatics:  No adenopathy Back:  No CVA tenderness Lungs:  Clear with well healed ICD incision. HEART:  Regular rate rhythm, no murmurs, no rubs, no clicks Abd:  soft, positive bowel sounds, no organomegally, no rebound, no guarding Ext:  2 plus pulses, no edema, no cyanosis, no clubbing Skin:  No rashes no nodules Neuro:  CN II through XII intact, motor grossly intact DEVICE  Normal device function.  See PaceArt for details.   Assess/Plan:

## 2011-06-28 NOTE — Assessment & Plan Note (Signed)
Her symptoms remain class 2. She will continue her current medical therapy and maintain a low sodium diet.

## 2011-06-28 NOTE — Assessment & Plan Note (Signed)
Her device is working normally. Will recheck in several months. 

## 2011-09-21 ENCOUNTER — Emergency Department (HOSPITAL_COMMUNITY): Payer: No Typology Code available for payment source

## 2011-09-21 ENCOUNTER — Encounter (HOSPITAL_COMMUNITY): Payer: Self-pay | Admitting: Emergency Medicine

## 2011-09-21 ENCOUNTER — Inpatient Hospital Stay (HOSPITAL_COMMUNITY)
Admission: EM | Admit: 2011-09-21 | Discharge: 2011-09-24 | DRG: 250 | Disposition: A | Discharge status: Home / Self Care | Payer: No Typology Code available for payment source | Attending: Cardiology | Admitting: Cardiology

## 2011-09-21 ENCOUNTER — Other Ambulatory Visit: Payer: Self-pay

## 2011-09-21 DIAGNOSIS — I428 Other cardiomyopathies: Secondary | ICD-10-CM | POA: Insufficient documentation

## 2011-09-21 DIAGNOSIS — I5022 Chronic systolic (congestive) heart failure: Secondary | ICD-10-CM

## 2011-09-21 DIAGNOSIS — Z85038 Personal history of other malignant neoplasm of large intestine: Secondary | ICD-10-CM

## 2011-09-21 DIAGNOSIS — I099 Rheumatic heart disease, unspecified: Secondary | ICD-10-CM | POA: Insufficient documentation

## 2011-09-21 DIAGNOSIS — Z954 Presence of other heart-valve replacement: Secondary | ICD-10-CM

## 2011-09-21 DIAGNOSIS — E876 Hypokalemia: Secondary | ICD-10-CM | POA: Diagnosis not present

## 2011-09-21 DIAGNOSIS — Z86718 Personal history of other venous thrombosis and embolism: Secondary | ICD-10-CM

## 2011-09-21 DIAGNOSIS — I4891 Unspecified atrial fibrillation: Secondary | ICD-10-CM

## 2011-09-21 DIAGNOSIS — Z7901 Long term (current) use of anticoagulants: Secondary | ICD-10-CM

## 2011-09-21 DIAGNOSIS — I472 Ventricular tachycardia, unspecified: Secondary | ICD-10-CM | POA: Diagnosis not present

## 2011-09-21 DIAGNOSIS — K648 Other hemorrhoids: Secondary | ICD-10-CM | POA: Insufficient documentation

## 2011-09-21 DIAGNOSIS — I5023 Acute on chronic systolic (congestive) heart failure: Secondary | ICD-10-CM | POA: Diagnosis present

## 2011-09-21 DIAGNOSIS — I959 Hypotension, unspecified: Secondary | ICD-10-CM | POA: Diagnosis present

## 2011-09-21 DIAGNOSIS — I509 Heart failure, unspecified: Secondary | ICD-10-CM | POA: Diagnosis present

## 2011-09-21 DIAGNOSIS — I495 Sick sinus syndrome: Secondary | ICD-10-CM | POA: Insufficient documentation

## 2011-09-21 DIAGNOSIS — I4901 Ventricular fibrillation: Secondary | ICD-10-CM | POA: Insufficient documentation

## 2011-09-21 DIAGNOSIS — I5042 Chronic combined systolic (congestive) and diastolic (congestive) heart failure: Secondary | ICD-10-CM | POA: Insufficient documentation

## 2011-09-21 DIAGNOSIS — I4729 Other ventricular tachycardia: Secondary | ICD-10-CM | POA: Diagnosis not present

## 2011-09-21 DIAGNOSIS — Z95810 Presence of automatic (implantable) cardiac defibrillator: Secondary | ICD-10-CM

## 2011-09-21 HISTORY — DX: Ventricular fibrillation: I49.01

## 2011-09-21 HISTORY — DX: Rheumatic heart disease, unspecified: I09.9

## 2011-09-21 HISTORY — DX: Embolism and thrombosis of iliac artery: I74.5

## 2011-09-21 HISTORY — DX: Other cardiomyopathies: I42.8

## 2011-09-21 HISTORY — DX: Sick sinus syndrome: I49.5

## 2011-09-21 HISTORY — DX: Chronic systolic (congestive) heart failure: I50.22

## 2011-09-21 LAB — POCT I-STAT, CHEM 8
BUN: 18 mg/dL (ref 6–23)
Calcium, Ion: 1.1 mmol/L — ABNORMAL LOW (ref 1.12–1.32)
Chloride: 113 mEq/L — ABNORMAL HIGH (ref 96–112)
Creatinine, Ser: 1 mg/dL (ref 0.50–1.10)
Glucose, Bld: 133 mg/dL — ABNORMAL HIGH (ref 70–99)
HCT: 47 % — ABNORMAL HIGH (ref 36.0–46.0)
Hemoglobin: 16 g/dL — ABNORMAL HIGH (ref 12.0–15.0)
Potassium: 4.6 mEq/L (ref 3.5–5.1)
Sodium: 142 mEq/L (ref 135–145)
TCO2: 17 mmol/L (ref 0–100)

## 2011-09-21 LAB — DIFFERENTIAL
Basophils Absolute: 0 10*3/uL (ref 0.0–0.1)
Basophils Relative: 0 % (ref 0–1)
Eosinophils Absolute: 0 10*3/uL (ref 0.0–0.7)
Eosinophils Relative: 1 % (ref 0–5)
Lymphocytes Relative: 22 % (ref 12–46)
Lymphs Abs: 0.9 10*3/uL (ref 0.7–4.0)
Monocytes Absolute: 0.3 10*3/uL (ref 0.1–1.0)
Monocytes Relative: 8 % (ref 3–12)
Neutro Abs: 2.9 10*3/uL (ref 1.7–7.7)
Neutrophils Relative %: 69 % (ref 43–77)

## 2011-09-21 LAB — APTT: aPTT: 38 seconds — ABNORMAL HIGH (ref 24–37)

## 2011-09-21 LAB — CBC
HCT: 44.1 % (ref 36.0–46.0)
Hemoglobin: 14.9 g/dL (ref 12.0–15.0)
MCH: 30 pg (ref 26.0–34.0)
MCHC: 33.8 g/dL (ref 30.0–36.0)
MCV: 88.9 fL (ref 78.0–100.0)
Platelets: 146 10*3/uL — ABNORMAL LOW (ref 150–400)
RBC: 4.96 MIL/uL (ref 3.87–5.11)
RDW: 16.4 % — ABNORMAL HIGH (ref 11.5–15.5)
WBC: 4.1 10*3/uL (ref 4.0–10.5)

## 2011-09-21 LAB — BASIC METABOLIC PANEL
BUN: 18 mg/dL (ref 6–23)
CO2: 20 mEq/L (ref 19–32)
Calcium: 10.1 mg/dL (ref 8.4–10.5)
Chloride: 106 mEq/L (ref 96–112)
Creatinine, Ser: 0.99 mg/dL (ref 0.50–1.10)
GFR calc Af Amer: 67 mL/min — ABNORMAL LOW (ref >90)
GFR calc non Af Amer: 58 mL/min — ABNORMAL LOW (ref >90)
Glucose, Bld: 121 mg/dL — ABNORMAL HIGH (ref 70–99)
Potassium: 4.1 mEq/L (ref 3.5–5.1)
Sodium: 139 mEq/L (ref 135–145)

## 2011-09-21 LAB — POCT I-STAT TROPONIN I: Troponin i, poc: 0.07 ng/mL (ref 0.00–0.08)

## 2011-09-21 LAB — CARDIAC PANEL(CRET KIN+CKTOT+MB+TROPI)
CK, MB: 3.6 ng/mL (ref 0.3–4.0)
Relative Index: 2.9 — ABNORMAL HIGH (ref 0.0–2.5)
Total CK: 124 U/L (ref 7–177)
Troponin I: <0.3 ng/mL (ref <0.30)

## 2011-09-21 LAB — PROTIME-INR
INR: 2.87 — ABNORMAL HIGH (ref 0.00–1.49)
Prothrombin Time: 30.5 seconds — ABNORMAL HIGH (ref 11.6–15.2)

## 2011-09-21 LAB — DIGOXIN LEVEL: Digoxin Level: <0.3 ng/mL — ABNORMAL LOW (ref 0.8–2.0)

## 2011-09-21 LAB — TSH: TSH: 1.158 u[IU]/mL (ref 0.350–4.500)

## 2011-09-21 LAB — MAGNESIUM: Magnesium: 2.2 mg/dL (ref 1.5–2.5)

## 2011-09-21 MED ORDER — SODIUM CHLORIDE 0.9 % IV SOLN
Freq: Once | INTRAVENOUS | Status: AC
  Administered 2011-09-21: 06:00:00 via INTRAVENOUS

## 2011-09-21 MED ORDER — SODIUM CHLORIDE 0.9 % IV SOLN: 250.0000 mL | INTRAVENOUS | Status: DC | PRN

## 2011-09-21 MED ORDER — FUROSEMIDE 40 MG PO TABS
40.0000 mg | ORAL_TABLET | Freq: Every day | ORAL | Status: DC
  Administered 2011-09-21 – 2011-09-22 (×2): 40 mg via ORAL
  Filled 2011-09-21 (×3): qty 1

## 2011-09-21 MED ORDER — SODIUM CHLORIDE 0.9 % IJ SOLN
3.0000 mL | Freq: Two times a day (BID) | INTRAMUSCULAR | Status: DC
  Administered 2011-09-21 – 2011-09-23 (×5): 3 mL via INTRAVENOUS

## 2011-09-21 MED ORDER — ONDANSETRON HCL 4 MG/2ML IJ SOLN: 4.0000 mg | Freq: Four times a day (QID) | INTRAMUSCULAR | Status: DC | PRN

## 2011-09-21 MED ORDER — DILTIAZEM HCL 25 MG/5ML IV SOLN
20.0000 mg | Freq: Once | INTRAVENOUS | Status: AC
  Administered 2011-09-21: 20 mg via INTRAVENOUS
  Filled 2011-09-21: qty 5

## 2011-09-21 MED ORDER — CARVEDILOL 12.5 MG PO TABS
12.5000 mg | ORAL_TABLET | Freq: Two times a day (BID) | ORAL | Status: DC
  Administered 2011-09-21 – 2011-09-23 (×3): 12.5 mg via ORAL
  Filled 2011-09-21 (×6): qty 1

## 2011-09-21 MED ORDER — ALPRAZOLAM 0.25 MG PO TABS: 0.2500 mg | ORAL_TABLET | Freq: Two times a day (BID) | ORAL | Status: DC | PRN

## 2011-09-21 MED ORDER — ACETAMINOPHEN 325 MG PO TABS: 650.0000 mg | ORAL_TABLET | ORAL | Status: DC | PRN

## 2011-09-21 MED ORDER — DILTIAZEM HCL 100 MG IV SOLR
5.0000 mg/h | Freq: Once | INTRAVENOUS | Status: AC
  Administered 2011-09-21: 5 mg/h via INTRAVENOUS

## 2011-09-21 MED ORDER — POTASSIUM CHLORIDE CRYS ER 10 MEQ PO TBCR
10.0000 meq | EXTENDED_RELEASE_TABLET | Freq: Every day | ORAL | Status: DC
  Administered 2011-09-21 – 2011-09-23 (×3): 10 meq via ORAL
  Filled 2011-09-21 (×3): qty 1

## 2011-09-21 MED ORDER — SODIUM CHLORIDE 0.9 % IJ SOLN: 3.0000 mL | INTRAMUSCULAR | Status: DC | PRN

## 2011-09-21 MED ORDER — ZOLPIDEM TARTRATE 5 MG PO TABS: 5.0000 mg | ORAL_TABLET | Freq: Every evening | ORAL | Status: DC | PRN

## 2011-09-21 MED ORDER — DIGOXIN 0.0625 MG HALF TABLET
0.0625 mg | ORAL_TABLET | Freq: Every day | ORAL | Status: DC
  Administered 2011-09-21 – 2011-09-23 (×3): 0.0625 mg via ORAL
  Filled 2011-09-21 (×3): qty 1

## 2011-09-21 MED ORDER — FUROSEMIDE 10 MG/ML IJ SOLN
40.0000 mg | Freq: Once | INTRAMUSCULAR | Status: AC
  Administered 2011-09-21: 40 mg via INTRAVENOUS
  Filled 2011-09-21: qty 4

## 2011-09-21 MED ORDER — WARFARIN SODIUM 6 MG PO TABS
9.0000 mg | ORAL_TABLET | Freq: Every day | ORAL | Status: DC
  Administered 2011-09-21: 9 mg via ORAL
  Filled 2011-09-21 (×2): qty 1

## 2011-09-21 NOTE — ED Notes (Signed)
Patient with shortness of breath after waking this am.  Patient does not have any CP at this time.  Patient on cardiac monitor, atrial fib, rate in 160's.

## 2011-09-21 NOTE — ED Notes (Signed)
Patient with shortness of breath and rapid heartrate.  Patient with history of same for last two days.

## 2011-09-21 NOTE — ED Provider Notes (Signed)
History     CSN: [id number]  Arrival date & time 09/21/11  0559   First MD Initiated Contact with Patient 09/21/11 [phone]      Chief Complaint  Patient presents with  . Shortness of Breath  . Atrial Fibrillation    (Consider location/radiation/quality/duration/timing/severity/associated sxs/prior treatment) HPI Comments: Ms. Fennimore is a 66 year old female with a history of dilated cardiomyopathy, chronic systolic congestive heart failure status post implantable cardio defibrillator. She had a ventricular fibrillation arrest in 2009 prompting this intervention. She also has colon cancer, atrial fibrillation and according to the medical record has had some difficulty controlling the ventricular rate. She is followed primarily by Dr. Ladona Ridgel of cardiology and Dr. Burton Apley is her primary Dr.  She reports that yesterday at an unknown time she developed acute onset of palpitations and shortness of breath. She does admit to having increased coughing lately and has been using Robitussin as a cough suppressant. Her symptoms have been persistent, gradually getting worse and are currently severe. There is no associated fevers chills nausea vomiting abdominal pain chest pain swelling of the legs diarrhea dysuria rashes or headaches.  EMS presents with cardiac tracing from the field showing atrial fibrillation with a rapid ventricular response at approximately 170 beats per minute.  Patient is a 66 y.o. female presenting with shortness of breath and atrial fibrillation. The history is provided by the patient, the EMS personnel and medical records.  Shortness of Breath  Associated symptoms include shortness of breath.  Atrial Fibrillation Associated symptoms include shortness of breath.    Past Medical History  Diagnosis Date  . Colon cancer     colon ca dx 2010  . Cardiomyopathy   . Atrial fibrillation   . Diverticular disease   . S/P implantation of automatic cardioverter/defibrillator  (AICD)   . Encounter for long-term (current) use of anticoagulants   . Carcinoma of colon   . History of colon cancer 1999    T3, N1  chemotherapy  . Internal hemorrhoids     Past Surgical History  Procedure Date  . Mitral valve replacement, bjork-shiley valve 1983  . Biventricular aicd   . Colon surgery 1999  . History of echocardiogram 2003, 2007, 2009    Family History  Problem Relation Age of Onset  . Diabetes Mother 40  . Cancer Father 36    Lung Cancer and smoker    History  Substance Use Topics  . Smoking status: Never Smoker   . Smokeless tobacco: Not on file  . Alcohol Use: No    OB History    Grav Para Term Preterm Abortions TAB SAB Ect Mult Living                  Review of Systems  Respiratory: Positive for shortness of breath.   All other systems reviewed and are negative.    Allergies  Review of patient's allergies indicates no known allergies.  Home Medications   Current Outpatient Rx  Name Route Sig Dispense Refill  . CARVEDILOL 12.5 MG PO TABS Oral Take 12.5 mg by mouth 2 (two) times daily with a meal.      . DIGOXIN 0.125 MG PO TABS  Take one half tablet daily by mouth 30 tablet 1  . FUROSEMIDE 40 MG PO TABS Oral Take 1 tablet (40 mg total) by mouth daily. 30 tablet 3  . POTASSIUM CHLORIDE 10 MEQ PO TBCR Oral Take 10 mEq by mouth daily.      Barron Alvine  SODIUM 1 MG PO TABS Oral Take 4 mg by mouth daily. Take with 5mg  tab to total 9mg s daily    . WARFARIN SODIUM 5 MG PO TABS Oral Take 5 mg by mouth daily. Takes with 1mg  tablets to total 9mg s daily      BP 104/73  Pulse 70  Temp(Src) 98.6 F (37 C) (Oral)  Resp 14  SpO2 100%  Physical Exam  Nursing note and vitals reviewed. Constitutional: She appears well-developed and well-nourished.       Patient mildly anxious appearing  HENT:  Head: Normocephalic and atraumatic.  Mouth/Throat: Oropharynx is clear and moist. No oropharyngeal exudate.  Eyes: Conjunctivae and EOM are normal.  Pupils are equal, round, and reactive to light. Right eye exhibits no discharge. Left eye exhibits no discharge. No scleral icterus.  Neck: Normal range of motion. Neck supple. No JVD present. No thyromegaly present.  Cardiovascular: Intact distal pulses.  Exam reveals no gallop and no friction rub.   Murmur ( soft systolic) heard.      Atrial fibrillation with rapid ventricular response, normal palpable pulse radial artery bilaterally  Pulmonary/Chest: Breath sounds normal. No respiratory distress. She has no wheezes. She has no rales. Tenderness: patient has defibrillator is in both the right upper and left upper chest wall, one of which is active.       Mild tachypnea, no focal wheezing rales or rhonchi, no accessory muscle use  Abdominal: Soft. Bowel sounds are normal. She exhibits no distension and no mass. There is no tenderness.  Musculoskeletal: Normal range of motion. She exhibits no edema and no tenderness.  Lymphadenopathy:    She has no cervical adenopathy.  Neurological: She is alert. Coordination normal.  Skin: Skin is warm and dry. No rash noted. No erythema.  Psychiatric: She has a normal mood and affect. Her behavior is normal.    ED Course  Procedures (including critical care time)  ED ECG REPORT   Date: 09/21/2011 1610  Rate: 129  Rhythm: atrial fibrillation  QRS Axis: left  Intervals: afib with rvr, QRS narrow  ST/T Wave abnormalities: nonspecific T wave changes LVH with repol abn  Conduction Disutrbances:no obvious abnormalities, frequent PVC's  Narrative Interpretation:   Old EKG Reviewed: Since 10/23/2010 there is increased rate - no visualized paced rhythm today   Labs Reviewed  BASIC METABOLIC PANEL - Abnormal; Notable for the following:    Glucose, Bld 121 (*)    GFR calc non Af Amer 58 (*)    GFR calc Af Amer 67 (*)    All other components within normal limits  CBC - Abnormal; Notable for the following:    RDW 16.4 (*)    Platelets 146 (*)    All  other components within normal limits  APTT - Abnormal; Notable for the following:    aPTT 38 (*)    All other components within normal limits  PROTIME-INR - Abnormal; Notable for the following:    Prothrombin Time 30.5 (*)    INR 2.87 (*)    All other components within normal limits  DIGOXIN LEVEL - Abnormal; Notable for the following:    Digoxin Level <0.3 (*)    All other components within normal limits  POCT I-STAT, CHEM 8 - Abnormal; Notable for the following:    Chloride 113 (*)    Glucose, Bld 133 (*)    Calcium, Ion 1.10 (*)    Hemoglobin 16.0 (*)    HCT 47.0 (*)    All other  components within normal limits  DIFFERENTIAL  POCT I-STAT TROPONIN I  I-STAT TROPONIN I  I-STAT, CHEM 8   Dg Chest Port 1 View  09/21/2011  *RADIOLOGY REPORT*  Clinical Data: Shortness of breath.  Cough.  A film.  PORTABLE CHEST - 1 VIEW  Comparison: 10/28/2010  Findings: Stable postoperative changes and cardiac pacemakers since previous study.  Diffuse cardiac enlargement with normal pulmonary vascularity.  Emphysematous changes in the upper lungs.  No focal airspace consolidation.  No blunting of costophrenic angles.  No pneumothorax.  Stable appearance since previous study, allowing for differences in technique.  IMPRESSION: Cardiac enlargement. Emphysematous changes in the lungs.  No evidence of active pulmonary disease.  Original Report Authenticated By: Marlon Pel, M.D.     1. Atrial fibrillation with rapid ventricular response       MDM  Patient appears to be in a cardiac arrhythmia consistent with atrial fibrillation with rapid ventricular response. EKG confirms same with frequent premature ventricular contractions. She is currently on a multitude of medications including digoxin as well as Coumadin. We'll need to check levels, further laboratory evaluation for possible ischemia, chest x-ray to rule out infiltrate etc. Until that time will treat in the interim with Cardizem bolus and  drip for rate control. Currently blood pressure is 135/97, temperature of 98.6 and oxygen saturations is 100% on room air.    6:45 AM, patient reevaluated and feels much better after getting Cardizem bolus and on a Cardizem drip with a resultant heart rate of 80-90 beats per minute in atrial fibrillation. Chest x-ray shows significant cardiomegaly with overall clear lung fields per my interpretation.  D/w Dr. Myrtis Ser - agrees with admission to the hospital and will come to see.  Critical care provided for severe tachycaridia requiring continuous cardizem drip.  CRITICAL CARE Performed by: Vida Roller   Total critical care time: 35  Critical care time was exclusive of separately billable procedures and treating other patients.  Critical care was necessary to treat or prevent imminent or life-threatening deterioration.  Critical care was time spent personally by me on the following activities: development of treatment plan with patient and/or surrogate as well as nursing, discussions with consultants, evaluation of patient's response to treatment, examination of patient, obtaining history from patient or surrogate, ordering and performing treatments and interventions, ordering and review of laboratory studies, ordering and review of radiographic studies, pulse oximetry and re-evaluation of patient's condition.   Vida Roller, MD 09/21/11 [phone]

## 2011-09-21 NOTE — ED Notes (Signed)
Admitting MD at bedside.

## 2011-09-21 NOTE — H&P (Signed)
Patient ID: Loyd Salvador MRN: [id number], DOB/AGE: [birth year]   Admit date: 09/21/2011   Primary Physician: Lorenda Peck, MD, MD Primary Cardiologist: Reece Agar. Taylor  Pt. Profile:   66 y/o female with below pmh who presented to ER with recurrent af rvr.  Problem List: Past Medical History  Diagnosis Date  . Nonischemic cardiomyopathy     A. 11/2001 - Cath - NL Cors;  B. 01/2008 Echo - EF 10-20%;   [name] Atrial fibrillation     A.  Chronic Coumadin  . Diverticular disease   . History of colon cancer     A.  1999 - T3, N1  chemotherapy  . Internal hemorrhoids   . Rheumatic heart disease     A. 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 Sick sinus syndrome     A.  s/p pacer in 1995.  B.    . Cardiac arrest - ventricular fibrillation     A.  12/2007;  B. 01/2008 St[employer] Jude Promote Bi-V ICD placed  . Systolic CHF, chronic     A.  11/979 Echo - EF 10-20%  . Embolus and thrombosis of iliac artery     A.  12/2001 iliofemoral embolus s/p r fem embolectomy    Past Surgical History  Procedure Date  . Mitral valve replacement, bjork-shiley valve 1983  . Biventricular aicd   . Colon surgery 1999  . History of echocardiogram 2003, 2007, 2009     Allergies: No Known Allergies  HPI:   66 y/o female with the above problem list who was in her usoh until approx 3 days ago (Wed of this week) when she began to experience tachypalps, orthopnea, and doe.  She weighs herself regularly and reports that her weight has been stable.  She has chronic mild LEE and this hasn't changed either.  She denies c/p, early satiety, or syncope.  She presented to ER this am b/c of progressive Ss and was found to be in afib with rvr - rates in 120's.  She was treated with IV dilt bolus followed by infusion @ 5mg /hr and rates are currently hovering in 70's.  She feels much better.  CXR shows no evidence of edema.  First trop is nl.   Home Medications Medications Prior to Admission - **GIVEN IN ED**  Medication Dose Route  Frequency Provider Last Rate Last Dose  . 0.9 %  sodium chloride infusion   Intravenous Once Vida Roller, MD 75 mL/hr at 09/21/11 [phone]    . diltiazem (CARDIZEM) 100 mg in dextrose 5 % 100 mL infusion  5-15 mg/hr Intravenous Once Vida Roller, MD 5 mL/hr at 09/21/11 0625 5 mg/hr at 09/21/11 0625  . diltiazem (CARDIZEM) injection 20 mg  20 mg Intravenous Once Vida Roller, MD   20 mg at 09/21/11 7829   Medications Prior to Admission - **HOME MEDS**  Medication Sig Dispense Refill  . carvedilol (COREG) 12.5 MG tablet Take 12.5 mg by mouth 2 (two) times daily with a meal.        . digoxin (LANOXIN) 0.125 MG tablet Take one half tablet daily by mouth  30 tablet  1  . furosemide (LASIX) 40 MG tablet Take 1 tablet (40 mg total) by mouth daily.  30 tablet  3  . potassium chloride (KLOR-CON) 10 MEQ CR tablet Take 10 mEq by mouth daily.        [name] warfarin (COUMADIN) 1 MG tablet Take 4 mg by mouth daily. Take with 5mg   tab to total 9mg s daily        Family History  Problem Relation Age of Onset  . Diabetes Mother 42  . Cancer Father 79    Lung Cancer and smoker  . Coronary artery disease Mother     s/p cabg  . Coronary artery disease Sister     s/p cabg    History   Social History  . Marital Status: Married    Spouse Name: N/A    Number of Children: 1  . Years of Education: N/A   Occupational History  . Retired    Social History Main Topics  . Smoking status: Never Smoker   . Smokeless tobacco: Not on file  . Alcohol Use: No  . Drug Use: No  . Sexually Active: Not on file   Other Topics Concern  . Not on file   Social History Narrative   The patient is married and has 1 child.  Lives in [place] with husband.  She is retired from working at [employer].  She does not smoke or drink     Review of Systems: General: negative for chills, fever, night sweats or weight changes.  Cardiovascular: +++ dyspnea on exertion, edema, and orthopnea.  negative for chest pain,  palpitations, paroxysmal nocturnal dyspnea. Dermatological: negative for rash Respiratory: negative for cough or wheezing Urologic: negative for hematuria Abdominal: negative for nausea, vomiting, diarrhea, bright red blood per rectum, melena, or hematemesis Neurologic: negative for visual changes, syncope, or dizziness All other systems reviewed and are otherwise negative except as noted above.  Physical Exam: Blood pressure 97/74, pulse 70, temperature 98.6 F (37 C), temperature source Oral, resp. rate 18, SpO2 100.00%.  General: Well developed, well nourished, in no acute distress. Head: Normocephalic, atraumatic, sclera non-icteric, no xanthomas, nares are without discharge.  Neck: Supple without bruits.  JVP approx 8cm. Lungs:  Resp regular and unlabored, few basilar crackles. Heart: ir ir, mech s1.  no s3, s4.  Soft  diast murmur @ apex. Abdomen: Soft, non-tender, non-distended, BS + x 4.  Msk:  Strength and tone appears normal for age. Extremities: No clubbing, cyanosis.  1+ bilat LE edema. DP/PT/Radials 1+ and equal bilaterally. Neuro: Alert and oriented X 3. Moves all extremities spontaneously. Psych: Normal affect.   Labs:   Results for orders placed during the hospital encounter of 09/21/11 (from the past 72 hour(s))  BASIC METABOLIC PANEL     Status: Abnormal   Collection Time   09/21/11  6:15 AM      Component Value Range Comment   Sodium 139  135 - 145 (mEq/L)    Potassium 4.1  3.5 - 5.1 (mEq/L)    Chloride 106  96 - 112 (mEq/L)    CO2 20  19 - 32 (mEq/L)    Glucose, Bld 121 (*) 70 - 99 (mg/dL)    BUN 18  6 - 23 (mg/dL)    Creatinine, Ser 1.61  0.50 - 1.10 (mg/dL)    Calcium 09.6  8.4 - 10.5 (mg/dL)    GFR calc non Af Amer 58 (*) >90 (mL/min)    GFR calc Af Amer 67 (*) >90 (mL/min)   CBC     Status: Abnormal   Collection Time   09/21/11  6:15 AM      Component Value Range Comment   WBC 4.1  4.0 - 10.5 (K/uL)    RBC 4.96  3.87 - 5.11 (MIL/uL)    Hemoglobin  14.9  12.0 -  15.0 (g/dL)    HCT 96.0  45.4 - 09.8 (%)    MCV 88.9  78.0 - 100.0 (fL)    MCH 30.0  26.0 - 34.0 (pg)    MCHC 33.8  30.0 - 36.0 (g/dL)    RDW 11.9 (*) 14.7 - 15.5 (%)    Platelets 146 (*) 150 - 400 (K/uL)   DIFFERENTIAL     Status: Normal   Collection Time   09/21/11  6:15 AM      Component Value Range Comment   Neutrophils Relative 69  43 - 77 (%)    Neutro Abs 2.9  1.7 - 7.7 (K/uL)    Lymphocytes Relative 22  12 - 46 (%)    Lymphs Abs 0.9  0.7 - 4.0 (K/uL)    Monocytes Relative 8  3 - 12 (%)    Monocytes Absolute 0.3  0.1 - 1.0 (K/uL)    Eosinophils Relative 1  0 - 5 (%)    Eosinophils Absolute 0.0  0.0 - 0.7 (K/uL)    Basophils Relative 0  0 - 1 (%)    Basophils Absolute 0.0  0.0 - 0.1 (K/uL)   APTT     Status: Abnormal   Collection Time   09/21/11  6:15 AM      Component Value Range Comment   aPTT 38 (*) 24 - 37 (seconds)   PROTIME-INR     Status: Abnormal   Collection Time   09/21/11  6:15 AM      Component Value Range Comment   Prothrombin Time 30.5 (*) 11.6 - 15.2 (seconds)    INR 2.87 (*) 0.00 - 1.49    DIGOXIN LEVEL     Status: Abnormal   Collection Time   09/21/11  6:15 AM      Component Value Range Comment   Digoxin Level <0.3 (*) 0.8 - 2.0 (ng/mL)   POCT I-STAT TROPONIN I     Status: Normal   Collection Time   09/21/11  6:37 AM      Component Value Range Comment   Troponin i, poc 0.07  0.00 - 0.08 (ng/mL)    Comment 3            POCT I-STAT, CHEM 8     Status: Abnormal   Collection Time   09/21/11  6:39 AM      Component Value Range Comment   Sodium 142  135 - 145 (mEq/L)    Potassium 4.6  3.5 - 5.1 (mEq/L)    Chloride 113 (*) 96 - 112 (mEq/L)    BUN 18  6 - 23 (mg/dL)    Creatinine, Ser 8.29  0.50 - 1.10 (mg/dL)    Glucose, Bld 562 (*) 70 - 99 (mg/dL)    Calcium, Ion 1.30 (*) 1.12 - 1.32 (mmol/L)    TCO2 17  0 - 100 (mmol/L)    Hemoglobin 16.0 (*) 12.0 - 15.0 (g/dL)    HCT 86.5 (*) 78.4 - 46.0 (%)      Radiology/Studies: Dg Chest Port 1  View  09/21/2011  *RADIOLOGY REPORT*  Clinical Data: Shortness of breath.  Cough.  A film.  PORTABLE CHEST - 1 VIEW  Comparison: 10/28/2010  Findings: Stable postoperative changes and cardiac pacemakers since previous study.  Diffuse cardiac enlargement with normal pulmonary vascularity.  Emphysematous changes in the upper lungs.  No focal airspace consolidation.  No blunting of costophrenic angles.  No pneumothorax.  Stable appearance since previous study, allowing for differences in technique.  IMPRESSION: Cardiac enlargement. Emphysematous changes in the lungs.  No evidence of active pulmonary disease.  Original Report Authenticated By: Marlon Pel, M.D.    EKG:  Afib, rvr, pvc's, 129, lvh w/ lat twi  ASSESSMENT AND PLAN:   1.  AFib RVR:  Pt presented to ED with 3 day history of DOE and orthopnea assoc w/ palpitations.  Here, she was found to be in afib w/ rvr, which has responded nicely to iv dilt with rates currently in 70's.  We will cont IV dilt for now with an eye towards weaning off if rate stable this afternoon or if she converts.  With low EF, not an ideal Dilt candidate going forward.  Review of Dr. Lubertha Basque last clinic note indicates that she is in afib ~ 1% of the time with elevated rates, though at that time she was apparently asymptomatic.  It was felt that she may require AVN ablation @ some point.  Cont bb and coumadin.  EP eval on Monday.  2.  Acute on Chronic Syst CHF/NICM:  In setting of above.  As afib had been asymptomatic in past ? If this is primary problem. She has mild volume overload on exam and we will treat her with 1 dose of IV lasix today an otw cont her home dose of po lasix.  Cont coreg and Digoxin.  She is not on an acei as an outpt, presumably 2/2 soft bp's.  Follow here.  3.  Rheumatic Heart Disease s/p MVR:  Cont coumadin.  INR 2.87.  Signed, Nicolasa Ducking, NP 09/21/2011, 8:22 AM Patient seen and examined. I agree with the assessment and plan as  detailed above. See also my additional thoughts below.   I personally evaluated the patient in the emergency room. The patient has return of rapid atrial fibrillation. It is possible that some volume overload may be playing a role. Her rate is better controlled currently with Cardizem. We will diaries her gently. If volume appears to a been a problem we may not need to push for AV node ablation at this time. We will reassess her and make that decision over the next day or 2. I spoke also with a family member in the room in the emergency room.  Willa Rough, MD, [facility] 09/21/2011 10:38 AM

## 2011-09-21 NOTE — Progress Notes (Signed)
ANTICOAGULATION CONSULT NOTE - Initial Consult  Pharmacy Consult for coumadin Indication: atrial fibrillation  No Known Allergies  Labs:  [facility] 09/21/11 0639 09/21/11 0615  HGB 16.0* 14.9  HCT 47.0* 44.1  PLT -- 146*  APTT -- 38*  LABPROT -- 30.5*  INR -- 2.87*  HEPARINUNFRC -- --  CREATININE 1.00 0.99  CKTOTAL -- --  CKMB -- --  TROPONINI -- --   Estimated Creatinine Clearance: 53.1 ml/min (by C-G formula based on Cr of 1).  Medical History: Past Medical History  Diagnosis Date  . Nonischemic cardiomyopathy     A. 11/2001 - Cath - NL Cors;  B. 01/2008 Echo - EF 10-20%;   [name] Atrial fibrillation     A.  Chronic Coumadin  . Diverticular disease   . History of colon cancer     A.  1999 - T3, N1  chemotherapy  . Internal hemorrhoids   . Rheumatic heart disease     A. 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 Sick sinus syndrome     A.  s/p pacer in 1995.  B.    . Cardiac arrest - ventricular fibrillation     A.  12/2007;  B. 01/2008 [employer] Promote Bi-V ICD placed  . Systolic CHF, chronic     A.  11/979 Echo - EF 10-20%  . Embolus and thrombosis of iliac artery     A.  12/2001 iliofemoral embolus s/p r fem embolectomy   Admit Complaint: Recurrent afib Assessment: Pt admitted with recurrent afib. She has been on chronic coumadin at home and her admission INR is therapeutic.   Anticoagulation: INR = 2.87 on 9mg  daiy at home. EP to eval for ablation. No complication  Infectious Disease: Afebrile  Cardiovascular: CHF, afib, RHD, SSS - now on coreg, dig, lasix. 137/79 HR 104. Not on ACEI due to low BPs. Dig level <0.3  Endocrinology: CBG 98 no h/o DM  Gastrointestinal / Nutrition: Heart   Neurology: A&O  Nephrology: Scr 1  Pulmonary: 100% RA  Hematology / Oncology: H/H 16/47 Plt 146k  PTA Medication Issues: home meds addressed  Best Practices: Coumadin  1. Coumadin 9mg  PO qday 2. Daily PT/INR

## 2011-09-22 DIAGNOSIS — Z7901 Long term (current) use of anticoagulants: Secondary | ICD-10-CM

## 2011-09-22 DIAGNOSIS — I369 Nonrheumatic tricuspid valve disorder, unspecified: Secondary | ICD-10-CM

## 2011-09-22 DIAGNOSIS — I4891 Unspecified atrial fibrillation: Principal | ICD-10-CM

## 2011-09-22 LAB — BASIC METABOLIC PANEL
BUN: 22 mg/dL (ref 6–23)
CO2: 24 mEq/L (ref 19–32)
Calcium: 9.2 mg/dL (ref 8.4–10.5)
Chloride: 106 mEq/L (ref 96–112)
Creatinine, Ser: 0.98 mg/dL (ref 0.50–1.10)
GFR calc Af Amer: 68 mL/min — ABNORMAL LOW (ref >90)
GFR calc non Af Amer: 58 mL/min — ABNORMAL LOW (ref >90)
Glucose, Bld: 107 mg/dL — ABNORMAL HIGH (ref 70–99)
Potassium: 3.7 mEq/L (ref 3.5–5.1)
Sodium: 143 mEq/L (ref 135–145)

## 2011-09-22 LAB — CARDIAC PANEL(CRET KIN+CKTOT+MB+TROPI)
CK, MB: 2.9 ng/mL (ref 0.3–4.0)
CK, MB: 2.6 ng/mL (ref 0.3–4.0)
Relative Index: INVALID (ref 0.0–2.5)
Relative Index: INVALID (ref 0.0–2.5)
Total CK: 93 U/L (ref 7–177)
Total CK: 95 U/L (ref 7–177)
Troponin I: <0.3 ng/mL
Troponin I: <0.3 ng/mL

## 2011-09-22 LAB — PROTIME-INR
INR: 3.06 — ABNORMAL HIGH (ref 0.00–1.49)
Prothrombin Time: 32.1 seconds — ABNORMAL HIGH (ref 11.6–15.2)

## 2011-09-22 LAB — MAGNESIUM: Magnesium: 2 mg/dL (ref 1.5–2.5)

## 2011-09-22 MED ORDER — SODIUM CHLORIDE 0.9 % IV BOLUS (SEPSIS)
250.0000 mL | Freq: Once | INTRAVENOUS | Status: AC
  Administered 2011-09-22: 250 mL via INTRAVENOUS

## 2011-09-22 MED ORDER — POTASSIUM CHLORIDE CRYS ER 20 MEQ PO TBCR
40.0000 meq | EXTENDED_RELEASE_TABLET | Freq: Once | ORAL | Status: AC
  Administered 2011-09-22: 40 meq via ORAL
  Filled 2011-09-22: qty 1

## 2011-09-22 NOTE — Progress Notes (Signed)
Called to see patient regarding hypotension with SBP 70/45 also confirmed low by manual cuff. Had 9 beats NSVT earlier. She was asymptomatic and feels "much better" than yesterday. Given low EF, low-dose NS bolus of 250cc was employed - repeat SBP 95/62 and patient feels fine. Note she had a 26-beat run of VT yesterday at 8pm. K is 3.7 today, received scheduled KCl this AM. Will supplement an additional KCl now, add-on a Mg level, and check a digoxin level in the morning. She is now saline locked and we will continue to monitor closely.  Dayna Dunn PA-C

## 2011-09-22 NOTE — Progress Notes (Signed)
Patient ID: Angela [name], female   DOB: 1946-01-10, 66 y.o.   MRN: [id number] SUBJECTIVE: The patient is stable today. She has diuresis 1700 cc. She does feel well. However her atrial fibrillation rate continues to be elevated.   Filed Vitals:   09/21/11 0930 09/21/11 1458 09/21/11 2123 09/22/11 0540  BP: 137/79 116/77 107/76 111/75  Pulse: 104 96 84 71  Temp: 98.7 F (37.1 C) 97.5 F (36.4 C) 98.6 F (37 C) 97.8 F (36.6 C)  TempSrc: Oral Oral Oral Oral  Resp: 18 19 18 20   Height: 5\' 7"  (1.702 m)     Weight: 141 lb 11.2 oz (64.275 kg)   135 lb 5.8 oz (61.4 kg)  SpO2: 100% 99% 95% 95%    Intake/Output Summary (Last 24 hours) at 09/22/11 1057 Last data filed at 09/22/11 1026  Gross per 24 hour  Intake 1082.58 ml  Output   2225 ml  Net -1142.42 ml    LABS: Basic Metabolic Panel:  Basename 09/22/11 0100 09/21/11 1001 09/21/11 0639 09/21/11 0615  NA 143 -- 142 --  K 3.7 -- 4.6 --  CL 106 -- 113* --  CO2 24 -- -- 20  GLUCOSE 107* -- 133* --  BUN 22 -- 18 --  CREATININE 0.98 -- 1.00 --  CALCIUM 9.2 -- -- 10.1  MG -- 2.2 -- --  PHOS -- -- -- --   Liver Function Tests: No results found for this basename: AST:2,ALT:2,ALKPHOS:2,BILITOT:2,PROT:2,ALBUMIN:2 in the last 72 hours No results found for this basename: LIPASE:2,AMYLASE:2 in the last 72 hours CBC:  Basename 09/21/11 0639 09/21/11 0615  WBC -- 4.1  NEUTROABS -- 2.9  HGB 16.0* 14.9  HCT 47.0* 44.1  MCV -- 88.9  PLT -- 146*   Cardiac Enzymes:  Basename 09/22/11 0902 09/22/11 0059 09/21/11 1542  CKTOTAL 95 93 124  CKMB 2.6 2.9 3.6  CKMBINDEX -- -- --  TROPONINI <0.30 <0.30 <0.30   BNP: No components found with this basename: POCBNP:3 D-Dimer: No results found for this basename: DDIMER:2 in the last 72 hours Hemoglobin A1C: No results found for this basename: HGBA1C in the last 72 hours Fasting Lipid Panel: No results found for this basename: CHOL,HDL,LDLCALC,TRIG,CHOLHDL,LDLDIRECT in the last 72  hours Thyroid Function Tests:  Basename 09/21/11 1001  TSH 1.158  T4TOTAL --  T3FREE --  THYROIDAB --    RADIOLOGY: Dg Chest Port 1 View  09/21/2011  *RADIOLOGY REPORT*  Clinical Data: Shortness of breath.  Cough.  A film.  PORTABLE CHEST - 1 VIEW  Comparison: 10/28/2010  Findings: Stable postoperative changes and cardiac pacemakers since previous study.  Diffuse cardiac enlargement with normal pulmonary vascularity.  Emphysematous changes in the upper lungs.  No focal airspace consolidation.  No blunting of costophrenic angles.  No pneumothorax.  Stable appearance since previous study, allowing for differences in technique.  IMPRESSION: Cardiac enlargement. Emphysematous changes in the lungs.  No evidence of active pulmonary disease.  Original Report Authenticated By: Marlon Pel, M.D.    PHYSICAL EXAM  The patient is very quiet but she is oriented to person time and place. Affect is normal. There is no jugular venous distention. Lungs reveal no obvious rales. There is no respiratory distress. Cardiac exam reveals S1 and S2. The rhythm is irregularly irregular. The abdomen is soft. There is no significant peripheral edema.   TELEMETRY: I personally reviewed to telemetry. There is atrial flutter ablation. The rate is elevated at times. There are also some wide complex beats.  ASSESSMENT AND PLAN:  Principal Problem:   *Atrial fibrillation   The patient continues to have atrial fibrillation with an elevated rate. I decided to keep her in the hospital to be seen by electrophysiology tomorrow. There has been mention of a possible AV node ablation in the past. I will put her Coumadin on hold for this.  Active Problems:   Nonischemic cardiomyopathy   The patient's LV function is affected by her atrial fib with a rate is increased. She has diaries 1700 cc since admission. Diuretics will be continued.   Rheumatic heart disease   Sick sinus syndrome   Systolic CHF,  chronic  Warfarin anticoagulation INR today is 3.0. I have discontinued her Coumadin just in case this is needed for a possible AV node ablation. It can be restarted if needed.   Willa Rough 09/22/2011 10:57 AM

## 2011-09-22 NOTE — Progress Notes (Signed)
2D Echo completed.  Meagan Johnson, RDCS 

## 2011-09-23 ENCOUNTER — Other Ambulatory Visit: Payer: Self-pay

## 2011-09-23 ENCOUNTER — Encounter (HOSPITAL_COMMUNITY)
Admission: EM | Disposition: A | Discharge status: Home / Self Care | Payer: Self-pay | Source: Home / Self Care | Attending: Cardiology

## 2011-09-23 DIAGNOSIS — I4891 Unspecified atrial fibrillation: Secondary | ICD-10-CM

## 2011-09-23 HISTORY — PX: AV NODE ABLATION: SHX5458

## 2011-09-23 LAB — PROTIME-INR
INR: 3.18 — ABNORMAL HIGH (ref 0.00–1.49)
Prothrombin Time: 33.1 seconds — ABNORMAL HIGH (ref 11.6–15.2)

## 2011-09-23 LAB — BASIC METABOLIC PANEL
BUN: 22 mg/dL (ref 6–23)
CO2: 27 mEq/L (ref 19–32)
Calcium: 9.4 mg/dL (ref 8.4–10.5)
Chloride: 104 mEq/L (ref 96–112)
Creatinine, Ser: 1.03 mg/dL (ref 0.50–1.10)
GFR calc Af Amer: 64 mL/min — ABNORMAL LOW (ref >90)
GFR calc non Af Amer: 55 mL/min — ABNORMAL LOW (ref >90)
Glucose, Bld: 94 mg/dL (ref 70–99)
Potassium: 3.6 mEq/L (ref 3.5–5.1)
Sodium: 143 mEq/L (ref 135–145)

## 2011-09-23 LAB — DIGOXIN LEVEL: Digoxin Level: 0.3 ng/mL — ABNORMAL LOW (ref 0.8–2.0)

## 2011-09-23 SURGERY — AV NODE ABLATION
Anesthesia: LOCAL

## 2011-09-23 MED ORDER — ONDANSETRON HCL 4 MG/2ML IJ SOLN: 4.0000 mg | Freq: Four times a day (QID) | INTRAMUSCULAR | Status: DC | PRN

## 2011-09-23 MED ORDER — SODIUM CHLORIDE 0.9 % IV SOLN: 250.0000 mL | INTRAVENOUS | Status: DC | PRN

## 2011-09-23 MED ORDER — SODIUM CHLORIDE 0.9 % IJ SOLN
3.0000 mL | Freq: Two times a day (BID) | INTRAMUSCULAR | Status: DC
  Administered 2011-09-23 – 2011-09-24 (×3): 3 mL via INTRAVENOUS

## 2011-09-23 MED ORDER — SODIUM CHLORIDE 0.9 % IJ SOLN: 3.0000 mL | INTRAMUSCULAR | Status: DC | PRN

## 2011-09-23 MED ORDER — DIGOXIN 0.0625 MG HALF TABLET
0.0625 mg | ORAL_TABLET | Freq: Every day | ORAL | Status: DC
  Administered 2011-09-24: 0.0625 mg via ORAL
  Filled 2011-09-23 (×2): qty 1

## 2011-09-23 MED ORDER — HEPARIN (PORCINE) IN NACL 2-0.9 UNIT/ML-% IJ SOLN
INTRAMUSCULAR | Status: AC
  Filled 2011-09-23: qty 1000

## 2011-09-23 MED ORDER — ACETAMINOPHEN 325 MG PO TABS: 650.0000 mg | ORAL_TABLET | ORAL | Status: DC | PRN

## 2011-09-23 MED ORDER — POTASSIUM CHLORIDE CRYS ER 10 MEQ PO TBCR
10.0000 meq | EXTENDED_RELEASE_TABLET | Freq: Every day | ORAL | Status: DC
  Administered 2011-09-24: 10 meq via ORAL
  Filled 2011-09-23: qty 1

## 2011-09-23 MED ORDER — MIDAZOLAM HCL 5 MG/5ML IJ SOLN
INTRAMUSCULAR | Status: AC
  Filled 2011-09-23: qty 5

## 2011-09-23 MED ORDER — FUROSEMIDE 20 MG PO TABS
20.0000 mg | ORAL_TABLET | Freq: Every day | ORAL | Status: DC
  Administered 2011-09-23 – 2011-09-24 (×2): 20 mg via ORAL
  Filled 2011-09-23 (×2): qty 1

## 2011-09-23 MED ORDER — CARVEDILOL 3.125 MG PO TABS
3.1250 mg | ORAL_TABLET | Freq: Two times a day (BID) | ORAL | Status: DC
  Administered 2011-09-24: 3.125 mg via ORAL
  Filled 2011-09-23 (×3): qty 1

## 2011-09-23 MED ORDER — FENTANYL CITRATE 0.05 MG/ML IJ SOLN
INTRAMUSCULAR | Status: AC
  Filled 2011-09-23: qty 2

## 2011-09-23 MED ORDER — BUPIVACAINE HCL (PF) 0.25 % IJ SOLN
INTRAMUSCULAR | Status: AC
  Filled 2011-09-23: qty 30

## 2011-09-23 MED ORDER — WARFARIN SODIUM 6 MG PO TABS
6.0000 mg | ORAL_TABLET | Freq: Once | ORAL | Status: AC
  Administered 2011-09-23: 6 mg via ORAL
  Filled 2011-09-23: qty 1

## 2011-09-23 NOTE — Op Note (Signed)
AV node ablation performed without immediate complication. [id number]

## 2011-09-23 NOTE — Progress Notes (Signed)
ANTICOAGULATION CONSULT NOTE - Follow Up Consult  Pharmacy Consult for Coumadin Indication: atrial fibrillation  Assessment: 66 yo F on Coumadin for Afib. Patient is s/p AV node ablation today. INR is slightly above goal range even after holding last night's dose. Will give lower dose than usual.  Spoke with Dr. Myrtis Ser and pharmacy can resume Coumadin today.  Home dose: Coumadin 9 mg/day  Goal of Therapy:  INR 2-3   Plan:  1. Coumadin 6 mg po tonight 2. INR daily  No Known Allergies  Patient Measurements: Height: 5\' 7"  (170.2 cm) Weight: 116 lb 10 oz (52.9 kg) (standing scale B) IBW/kg (Calculated) : 61.6   Vital Signs: Temp: 98.1 F (36.7 C) (01/28 1156) Temp src: Oral (01/28 0414) BP: 97/80 mmHg (01/28 1430) Pulse Rate: 83  (01/28 1430)  Labs:  Alvira Philips 09/23/11 0545 09/22/11 0902 09/22/11 0100 09/22/11 0059 09/21/11 1542 09/21/11 0639 09/21/11 0615  HGB -- -- -- -- -- 16.0* 14.9  HCT -- -- -- -- -- 47.0* 44.1  PLT -- -- -- -- -- -- 146*  APTT -- -- -- -- -- -- 38*  LABPROT 33.1* -- 32.1* -- -- -- 30.5*  INR 3.18* -- 3.06* -- -- -- 2.87*  HEPARINUNFRC -- -- -- -- -- -- --  CREATININE 1.03 -- 0.98 -- -- 1.00 --  CKTOTAL -- 95 -- 93 124 -- --  CKMB -- 2.6 -- 2.9 3.6 -- --  TROPONINI -- <0.30 -- <0.30 <0.30 -- --   Estimated Creatinine Clearance: 44.3 ml/min (by C-G formula based on Cr of 1.03).   Medications:  Scheduled:    . bupivacaine      . fentaNYL      . heparin      . midazolam      . potassium chloride  40 mEq Oral Once  . sodium chloride  250 mL Intravenous Once  . sodium chloride  3 mL Intravenous Q12H  . DISCONTD: carvedilol  12.5 mg Oral BID WC  . DISCONTD: digoxin  0.0625 mg Oral Daily  . DISCONTD: furosemide  40 mg Oral Daily  . DISCONTD: potassium chloride  10 mEq Oral Daily  . DISCONTD: sodium chloride  3 mL Intravenous Q12H    Lovell Sheehan 09/23/2011,3:09 PM

## 2011-09-23 NOTE — Progress Notes (Addendum)
Called by RN because of low BP. Pt is asymptomatic but SBP is in the 80s. She had this problem during the AV node ablation today but it improved with IVF. However, since the patient's EF is only 20% I do not wish to continue with the IV fluid boluses. Currently she is having no symptoms of volume overload in her oxygen saturation is within normal limits. She received her dose of chloride 12.5 mg this morning but has not had her Lasix 40 mg today. None of her home medications were ordered post procedure. I recommended that the patient get every 2 hours vital signs until her blood pressure starts to improve. As long as she is asymptomatic, and her blood pressure stays above 80, we will hold off on the any intervention at this time. Currently her MAP is greater than 65. If she is becomes symptomatic or her systolic blood pressure drops any lower, she will be started on dopamine.  I have reordered her home medications including Coreg and Lasix at decreased doses. Coumadin will be per pharmacy. We will recheck a bmet in a.m.

## 2011-09-23 NOTE — Progress Notes (Signed)
Patient ID: Angela [name], female   DOB: 1946/01/07, 66 y.o.   MRN: [id number] Subjective:  Dyspnea improved. Ventricular rate remains elevated.  Objective:  Vital Signs in the last 24 hours: Temp:  [97.5 F (36.4 C)-98.9 F (37.2 C)] 98.3 F (36.8 C) (01/28 0414) Pulse Rate:  [76-91] 91  (01/28 0414) Resp:  [19-20] 20  (01/28 0414) BP: (70-101)/(45-70) 91/70 mmHg (01/28 0414) SpO2:  [97 %-100 %] 100 % (01/28 0414) Weight:  [52.9 kg (116 lb 10 oz)] 52.9 kg (116 lb 10 oz) (01/28 0414)  Intake/Output from previous day: 01/27 0701 - 01/28 0700 In: 850 [P.O.:600; IV Piggyback:250] Out: 3150 [Urine:3150] Intake/Output from this shift: Total I/O In: 243 [P.O.:240; I.V.:3] Out: -   Physical Exam: Well appearing NAD HEENT: Unremarkable Neck:  No JVD, no thyromegally Lymphatics:  No adenopathy Back:  No CVA tenderness Lungs:  Clear with no wheezes. HEART:  IRegular rate rhythm, no murmurs, no rubs, no clicks Abd:  Flat, positive bowel sounds, no organomegally, no rebound, no guarding Ext:  2 plus pulses, no edema, no cyanosis, no clubbing Skin:  No rashes no nodules Neuro:  CN II through XII intact, motor grossly intact  Lab Results:  Basename 09/21/11 0639 09/21/11 0615  WBC -- 4.1  HGB 16.0* 14.9  PLT -- 146*    Basename 09/23/11 0545 09/22/11 0100  NA 143 143  K 3.6 3.7  CL 104 106  CO2 27 24  GLUCOSE 94 107*  BUN 22 22  CREATININE 1.03 0.98    Basename 09/22/11 0902 09/22/11 0059  TROPONINI <0.30 <0.30   Hepatic Function Panel No results found for this basename: PROT,ALBUMIN,AST,ALT,ALKPHOS,BILITOT,BILIDIR,IBILI in the last 72 hours No results found for this basename: CHOL in the last 72 hours No results found for this basename: PROTIME in the last 72 hours  Imaging: No results found.  Cardiac Studies: Tele - atrial fib with an RVR Assessment/Plan:  1. Uncontrolled Atrial fib - I have discussed the treatment options with the patient and her family and  have recommeded proceeding with AV node ablation. The risks/benefits/goals/expectations of the procedure have been discussed with the patient and she wishes to proceed. 2. Acute/chronic systolic CHF - her symptoms are improved with IV diuretics. Will adjust meds and switch over to po after ablation.   LOS: 2 days    Lewayne Bunting 09/23/2011, 11:43 AM

## 2011-09-23 NOTE — Progress Notes (Signed)
Pt leaving unit for ablation in cath lab.  Pt denies the procedure being discussed with her at this time.  Unable to obtain consent.  Cath lab/MD has been made aware. Nino Glow RN

## 2011-09-23 NOTE — Progress Notes (Signed)
Pt BP 83/65 in R arm and 79/49 in L arm.  Pt asymptomatic stating she cannot tell her BP is low.  MD has been notified.  Ordered to hold Lasix at this time.  Will continue to monitor. Nino Glow RN

## 2011-09-23 NOTE — Progress Notes (Signed)
MD notified concerning pt BP trending down.  Last BP 82/60.  Pt remains asymptomatic with heart rate sustaining in the 80's.  MD ordered frequent monitoring of BP until SBP recovers.  Order passed on to nigh nurse during report. Nino Glow RN

## 2011-09-24 ENCOUNTER — Other Ambulatory Visit: Payer: Self-pay

## 2011-09-24 ENCOUNTER — Telehealth: Payer: Self-pay | Admitting: Internal Medicine

## 2011-09-24 LAB — BASIC METABOLIC PANEL
BUN: 18 mg/dL (ref 6–23)
CO2: 26 mEq/L (ref 19–32)
Calcium: 9.4 mg/dL (ref 8.4–10.5)
Chloride: 106 mEq/L (ref 96–112)
Creatinine, Ser: 0.98 mg/dL (ref 0.50–1.10)
GFR calc Af Amer: 68 mL/min — ABNORMAL LOW (ref >90)
GFR calc non Af Amer: 58 mL/min — ABNORMAL LOW (ref >90)
Glucose, Bld: 92 mg/dL (ref 70–99)
Potassium: 3.5 mEq/L (ref 3.5–5.1)
Sodium: 142 mEq/L (ref 135–145)

## 2011-09-24 LAB — PROTIME-INR
INR: 3.23 — ABNORMAL HIGH (ref 0.00–1.49)
Prothrombin Time: 33.5 seconds — ABNORMAL HIGH (ref 11.6–15.2)

## 2011-09-24 MED ORDER — FUROSEMIDE 20 MG PO TABS: 40.0000 mg | ORAL_TABLET | Freq: Every day | ORAL | Status: DC

## 2011-09-24 MED ORDER — WARFARIN SODIUM 5 MG PO TABS: 5.0000 mg | ORAL_TABLET | Freq: Every day | ORAL | Status: DC

## 2011-09-24 MED ORDER — WARFARIN SODIUM 1 MG PO TABS: 4.0000 mg | ORAL_TABLET | Freq: Every day | ORAL | Status: DC

## 2011-09-24 MED ORDER — DIGOXIN 0.0625 MG HALF TABLET: ORAL_TABLET | ORAL | Status: DC

## 2011-09-24 MED ORDER — LOSARTAN POTASSIUM 25 MG PO TABS: 25.0000 mg | ORAL_TABLET | Freq: Every day | ORAL | Status: DC

## 2011-09-24 MED ORDER — CARVEDILOL 3.125 MG PO TABS: 3.2150 mg | ORAL_TABLET | Freq: Two times a day (BID) | ORAL | Status: DC

## 2011-09-24 MED ORDER — LOSARTAN POTASSIUM 25 MG PO TABS
25.0000 mg | ORAL_TABLET | Freq: Every day | ORAL | Status: DC
  Administered 2011-09-24: 25 mg via ORAL
  Filled 2011-09-24: qty 1

## 2011-09-24 NOTE — Progress Notes (Signed)
Cardiology Progress Note Patient Name: Angela [name] Date of Encounter: 09/24/2011, 6:47 AM     Subjective  BP was in the low 80s yesterday, but she remained asymptomatic. Improved to 90s by this am. She denies chest pain, shortness of breath, or palpitations. Reports feeling back to baseline and would like to go home today.   Objective   Telemetry: AV-Paced 80s, multiple PVCs and episodes of NSVT; ~4 episodes of 6 beat VT  Medications:   . carvedilol  3.125 mg Oral BID WC  . digoxin  0.0625 mg Oral Daily  . furosemide  20 mg Oral Daily  . potassium chloride  10 mEq Oral Daily  . sodium chloride  3 mL Intravenous Q12H  . warfarin  6 mg Oral ONCE-1800  . DISCONTD: carvedilol  12.5 mg Oral BID WC  . DISCONTD: digoxin  0.0625 mg Oral Daily  . DISCONTD: furosemide  40 mg Oral Daily  . DISCONTD: potassium chloride  10 mEq Oral Daily  . DISCONTD: sodium chloride  3 mL Intravenous Q12H    Physical Exam: Temp:  [97.9 F (36.6 C)-98.2 F (36.8 C)] 98.2 F (36.8 C) (01/29 0500) Pulse Rate:  [60-86] 80  (01/29 0500) Resp:  [16-20] 16  (01/29 0500) BP: (79-143)/(49-82) 95/76 mmHg (01/29 0500) SpO2:  [99 %-100 %] 100 % (01/29 0500) Weight:  [116 lb 11.2 oz (52.935 kg)] 116 lb 11.2 oz (52.935 kg) (01/29 0500)  General: Thin, elderly black female in no acute distress. Head: Normocephalic, atraumatic, sclera non-icteric, nares are without discharge.  Neck: Supple. Negative for carotid bruits or JVD Lungs: Clear bilaterally to auscultation without wheezes, rales, or rhonchi. Breathing is unlabored. Heart: RRR S1 S2 without murmurs, rubs, or gallops.  Abdomen: Soft, non-tender, non-distended with normoactive bowel sounds. No rebound/guarding. No obvious abdominal masses. Msk:  Strength and tone appear normal for age. Extremities: Right groin without hematoma. No edema. No clubbing or cyanosis. Distal pedal pulses are 1+ and equal bilaterally. Neuro: Alert and oriented X 3. Moves all  extremities spontaneously. Psych:  Responds to questions appropriately with a normal affect.  Intake/Output Summary (Last 24 hours) at 09/24/11 0647 Last data filed at 09/23/11 1955  Gross per 24 hour  Intake    246 ml  Output    200 ml  Net     46 ml    Labs:  Basename 09/24/11 0535 09/23/11 0545 09/22/11 1601 09/21/11 1001  NA 142 143 -- --  K 3.5 3.6 -- --  CL 106 104 -- --  CO2 26 27 -- --  GLUCOSE 92 94 -- --  BUN 18 22 -- --  CREATININE 0.98 1.03 -- --  CALCIUM 9.4 9.4 -- --  MG -- -- 2.0 2.2  PHOS -- -- -- --   Basename 09/22/11 0902 09/22/11 0059 09/21/11 1542  CKTOTAL 95 93 124  CKMB 2.6 2.9 3.6  TROPONINI <0.30 <0.30 <0.30   Basename 09/21/11 1001  TSH 1.158     09/24/2011 05:35  Prothrombin Time 33.5 (H)  INR 3.23 (H)    Radiology/Studies:   09/22/11 - 2D Echocardiogram Study Conclusions - Left ventricle: The cavity size was severely dilated. Wall thickness was normal. The estimated ejection fraction was 20%. Diffuse hypokinesis. - Mitral valve: Normal appearing mechanical MVR. Normal diastolic gradient and no peri valvular leak - Left atrium: The atrium was moderately dilated. - Right atrium: The atrium was mildly dilated. - Atrial septum: No defect or patent foramen ovale  was identified.   Dg Chest Port 1 View 09/21/2011   Findings: Stable postoperative changes and cardiac pacemakers since previous study.  Diffuse cardiac enlargement with normal pulmonary vascularity.  Emphysematous changes in the upper lungs.  No focal airspace consolidation.  No blunting of costophrenic angles.  No pneumothorax.  Stable appearance since previous study, allowing for differences in technique.  IMPRESSION: Cardiac enlargement. Emphysematous changes in the lungs.  No evidence of active pulmonary disease.      Assessment and Plan  66 y.o. female w/ PMHx significant for Chronic Systolic CHF 2/2 NonIschemic CM (s/p BiV ICD '09), Rheumatic heart dz s/p MVR, and A. Fib w/ RVR  (on coumadin) who presented to [facility] on 09/18/11 with complaints of tachypalpitations, orthopnea and DOE and was found to be in afib w/ rvr and in acute on chronic systolic heart failure.  1. A. Fib w/ RVR: s/p AV node ablation yesterday. She tolerated the procedure well without complications. Currently AV-Paced in the 80s. Digoxin and BB continued yesterday. MD to reassess necessity of digoxin continuation s/p AV nodal ablation. INR 3.23 this am. Cont Warfarin per pharmacy.  2. Acute on Chronic Systolic CHF (s/p BiV ICD '09): EF 20% by echo 09/22/11. Euvolemic on exam with symptomatic improvement. Coreg, Digoxin, and Lasix continued yesterday.  3. Hypotension: She has had BPs in the 70s-80s over the past 48hrs. She received IVF bolus on 1/27. Yesterday BP was in the 80s, but she remained asymptomatic so no fluid boluses were given as her EF is 20%. BB and lasix were restarted yesterday, but at lower doses due to her BP.  4. NSVT/VT: She is having multiple PVCs as well as multiple episodes of ~2-4 beat runs NSVT and ~4 episodes of 6 beat VT. She remains asymptomatic without complaints of chest pain or palpitations. ICD in place with no shocks. Electrolytes WNL. Cont BB.  Signed, HOPE, JESSICA PA-C

## 2011-09-24 NOTE — Progress Notes (Signed)
ANTICOAGULATION CONSULT NOTE - Follow Up Consult  Pharmacy Consult for Coumadin Indication: atrial fibrillation and mechanical MVR  Assessment: 66 yo F on Coumadin for Afib and mechanical MVR. Patient is s/p AV node ablation. INR is therapeutic. No bleeding noted.  Spoke with BJ from Dr. Les Pou office today to clarify INR goal and it is 2.5-3.5. I also spoke with Ms. Broaden who states to me her dose may change every month.  Home dose PTA: Coumadin 9 mg/day  Home dose may be too much for this patient. Her INR has remained therapeutic despite missing a dose on 1/27.   Goal of Therapy:  INR 2.5-3.5   Plan:  1. Consider changing Coumadin to 9 mg daily except 6 mg on Mon and Wed (10% decrease in weekly regimen). 2. Would recommend she f/u with Dr. Su Hilt for INR check this week or next.  No Known Allergies  Patient Measurements: Height: 5\' 7"  (170.2 cm) Weight: 116 lb 11.2 oz (52.935 kg) (scale b: weighed pt twice) IBW/kg (Calculated) : 61.6   Vital Signs: Temp: 97 F (36.1 C) (01/29 0900) Temp src: Oral (01/29 0900) BP: 101/67 mmHg (01/29 0900) Pulse Rate: 85  (01/29 1011)  Labs:  Alvira Philips 09/24/11 0535 09/23/11 0545 09/22/11 0902 09/22/11 0100 09/22/11 0059 09/21/11 1542  HGB -- -- -- -- -- --  HCT -- -- -- -- -- --  PLT -- -- -- -- -- --  APTT -- -- -- -- -- --  LABPROT 33.5* 33.1* -- 32.1* -- --  INR 3.23* 3.18* -- 3.06* -- --  HEPARINUNFRC -- -- -- -- -- --  CREATININE 0.98 1.03 -- 0.98 -- --  CKTOTAL -- -- 95 -- 93 124  CKMB -- -- 2.6 -- 2.9 3.6  TROPONINI -- -- <0.30 -- <0.30 <0.30   Estimated Creatinine Clearance: 46.5 ml/min (by C-G formula based on Cr of 0.98).   Medications:  Scheduled:     . bupivacaine      . carvedilol  3.125 mg Oral BID WC  . digoxin  0.0625 mg Oral Daily  . fentaNYL      . furosemide  20 mg Oral Daily  . heparin      . losartan  25 mg Oral Daily  . midazolam      . potassium chloride  10 mEq Oral Daily  . sodium chloride   3 mL Intravenous Q12H  . warfarin  6 mg Oral ONCE-1800  . DISCONTD: carvedilol  12.5 mg Oral BID WC  . DISCONTD: digoxin  0.0625 mg Oral Daily  . DISCONTD: furosemide  40 mg Oral Daily  . DISCONTD: potassium chloride  10 mEq Oral Daily  . DISCONTD: sodium chloride  3 mL Intravenous Q12H    [place], Jennifer Danielle 09/24/2011,10:38 AM

## 2011-09-24 NOTE — Telephone Encounter (Signed)
Spoke with pharmacist and clarified dose  All Rx's taken care of

## 2011-09-24 NOTE — Telephone Encounter (Signed)
New Msg: Pharmacy calling wanting to speak to nurse/MD regarding dosage of digoxin. Medication doesn't come in the dosage that was request. Please return pt call to discuss further.

## 2011-09-24 NOTE — Progress Notes (Signed)
09/24/11 1315 UR Completed. Tera Mater, RN, BSN

## 2011-09-24 NOTE — Progress Notes (Signed)
09/24/11 1442 Nursing Note: Pt to be discharged per MD's order. Pt's IV and cardiac monitor discontinued per MD's order. Pt stable at this time. Pt received all discharge information. Will escort pt to car safely when discharged. Camella Scientist, clinical (histocompatibility and immunogenetics).

## 2011-09-24 NOTE — Op Note (Signed)
Angela [name], Angela [name] NO.:  [id number]  MEDICAL RECORD NO.:  [id number]  LOCATION:  4735                         FACILITY:  [facility]  PHYSICIAN:  Doylene Canning. Ladona Ridgel, MD    DATE OF BIRTH:  [birth year]  DATE OF PROCEDURE:  09/23/2011 DATE OF DISCHARGE:                              OPERATIVE REPORT   ELECTROPHYSIOLOGIC PROCEDURE NOTE.  PROCEDURE PERFORMED:  AV node ablation.  INDICATION:  Symptomatic rapid atrial fibrillation.  INTRODUCTION:  The patient is a 66 year old woman with a history of congestive heart failure, nonischemic cardiomyopathy, and atrial fibrillation with rapid ventricular response.  She has had very difficult to control ventricular rate in AFib, and she is now referred for AV node ablation.  PROCEDURE:  After informed consent was obtained, the patient was taken to the diagnostic EP lab in a fasting state.  After usual preparation and draping, intravenous fentanyl and midazolam was given for sedation. Lidocaine 30 mL was infiltrated into the right groin region.  A 7-French quadripolar ablation catheter was inserted percutaneously by the way of the right femoral vein and advanced into the right atrium.  Mapping was carried out near the His bundle region.  Two RF energy applications were subsequently delivered resulting in the creation of complete heart block.  The patient was observed for 5 minutes and had no recurrent AV node conduction.  Following this, the patient's ICD was re-programmed to the VVI mode at 80 beats per minute.  She was returned to recovery area in satisfactory condition.  COMPLICATIONS:  There were no immediate procedure complications.  RESULTS:  This demonstrates successful AV node ablation in the patient with atrial fibrillation and rapid ventricular response, status post prior Bi-V ICD implantation.     Doylene Canning. Ladona Ridgel, MD     GWT/MEDQ  D:  09/23/2011  T:  09/24/2011  Job:  [id number]  cc:   Luis Abed

## 2011-09-24 NOTE — Discharge Summary (Signed)
Discharge Summary   Patient ID: Angela [name] MRN: [id number], DOB/AGE: [birth year] 66 y.o.  Primary MD: Lorenda Peck, MD Primary Cardiologist: Lewayne Bunting MD  Admit date: 09/21/2011 D/C date:     09/24/2011      Primary Discharge Diagnoses:  1. Atrial Fibrillation w/ RVR  - S/p AV node ablation 09/23/11, now BiV paced  - On coumadin (goal INR 2.5-3.5 for MVR)  2. Acute on Chronic Systolic CHF  - Exacerbation likely due to #1  - EF 20% by echo 09/22/11  3. NSVT/VT  - Asymptomatic, No ICD shock  4. Hypotension  - SBPs 70s-80s, resolved w/ IVF and reduction in Coreg and Lasix  Secondary Discharge Diagnoses:  1. Nonischemic Cardiomyopathy - normal coronary arteries by cath '03 2. Rheumatic Heart Disease s/p Bjork-Shiley MVR '83 - on Coumadin 3. Sick Sinus Syndrome s/p PPM '95 with most recent BiV ICD '09 4. V. Fib Cardiac Arrest s/p St. Jude Promote BiV ICD '09 5. 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 Diverticular disease 7. Internal Hemorrhoids  Allergies No Known Allergies  Diagnostic Studies/Procedures:   09/22/11 - 2D Echocardiogram  Study Conclusions - Left ventricle: The cavity size was severely dilated. Wall thickness was normal. The estimated ejection fraction was 20%. Diffuse hypokinesis. - Mitral valve: Normal appearing mechanical MVR. Normal diastolic gradient and no peri valvular leak - Left atrium: The atrium was moderately dilated. - Right atrium: The atrium was mildly dilated. - Atrial septum: No defect or patent foramen ovale was identified.   09/23/11 - AV Node Ablation INDICATION: Symptomatic rapid atrial fibrillation. - Mapping was carried out near the His bundle region. Two RF energy applications were subsequently delivered resulting in the creation of complete heart block. - ICD was re-programmed to the VVI mode at 80 beats per minute   History of Present Illness: 66 y.o. female w/ PMHx significant for  Chronic Systolic CHF 2/2 NonIschemic CM (s/p BiV ICD '09), Rheumatic heart dz s/p MVR, and A. Fib w/ RVR (on coumadin) who presented to [facility] on 09/18/11 with complaints of tachypalpitations, orthopnea, and DOE and was found to be in afib w/ rvr and in acute on chronic systolic heart failure.  She was in her usoh until approx 3 days ago prior to presentation when she began to experience tachypalps, orthopnea, and doe. She denied c/p, early satiety, or syncope. She presented to ER b/c of progressive symptoms.   Hospital Course: In the ED her EKG revealed Afib w/ RVR, PVC's, 129bpm, LVH w/ lat TWI. She was treated with an IV diltiazem bolus followed by infusion with decreased rates initially and symptom improvement. CXR showed no evidence of edema. First poc troponin was normal. She was admitted for further evaluation and treatment.  She was gently diuresed with Lasix with improvement in volume status. She continued to have A. Fib with elevated rates and it was felt she would benefit from AV node ablation. She underwent AV node ablation on 09/23/11 and tolerated the procedure well without complications. Her BiV ICD was reprogrammed to the VVI mode at 80bpm. Her SBPs were noted to be in the 70s-80s for which she received IVF and a decrease in her Coreg and Lasix doses with SBPs increased to the 90s.  She was seen and evaluated by Dr. Ladona Ridgel who felt she was stable for discharge home with plans for follow up as scheduled below. She was initiated and discharged on Cozaar 25mg  daily.  Discharge Vitals: Blood pressure 101/67, pulse 85, temperature 97 F (36.1 C), temperature source Oral, resp. rate 20, height 5\' 7"  (1.702 m), weight 116 lb 11.2 oz (52.935 kg), SpO2 99.00%.  Labs: Component Value Date   WBC 4.1 09/21/2011   HGB 16.0* 09/21/2011   HCT 47.0* 09/21/2011   MCV 88.9 09/21/2011   PLT 146* 09/21/2011    Lab 09/24/11 0535  NA 142  K 3.5  CL 106  CO2 26  BUN 18  CREATININE 0.98    CALCIUM 9.4  GLUCOSE 92   Basename 09/22/11 0902 09/22/11 0059 09/21/11 1542  CKTOTAL 95 93 124  CKMB 2.6 2.9 3.6  TROPONINI <0.30 <0.30 <0.30   Basename  09/21/11 1001   TSH  1.158     09/24/2011 05:35   Prothrombin Time  33.5 (H)   INR  3.23 (H)       Discharge Medications   Medication List  As of 09/24/2011  1:57 PM   TAKE these medications         carvedilol 3.125 MG tablet   Commonly known as: COREG   Take 1 tablet (3.125 mg total) by mouth 2 (two) times daily with a meal.      digoxin 0.0625 mg Tabs   Commonly known as: LANOXIN   Take one half tablet daily by mouth      furosemide 20 MG tablet   Commonly known as: LASIX   Take 2 tablets (40 mg total) by mouth daily.      losartan 25 MG tablet   Commonly known as: COZAAR   Take 1 tablet (25 mg total) by mouth daily.      potassium chloride 10 MEQ CR tablet   Commonly known as: KLOR-CON   Take 10 mEq by mouth daily.      warfarin 1 MG tablet   Commonly known as: COUMADIN   Take 4 tablets (4 mg total) by mouth daily. Take 4 of the 1mg  tabs with the 5mg  tab to total 9mg  on Sunday, Tuesday, Thursday, Friday, and Saturday. On Monday and Wednesday you should take 1 of the 1mg  tabs with the 5mg  tab to total 6mg .      warfarin 5 MG tablet   Commonly known as: COUMADIN   Take 1 tablet (5 mg total) by mouth daily. Take 1 of the 5mg  tabs daily to equal 6mg  on Monday and Wednesday and to equal 9mg  on the other days.            Disposition   Discharge Orders    Future Appointments: Provider: Department: Dept Phone: Center:   09/26/2011 10:40 AM Joice Lofts Caryl Bis, RN [facility] [phone] [facility]   10/09/2011 11:30 AM Beatrice Lecher, PA [facility] [phone] [facility]     Future Orders Please Complete By Expires   Diet - low sodium heart healthy      Increase activity slowly      Discharge instructions      Comments:   **PLEASE REMEMBER TO BRING ALL OF YOUR MEDICATIONS TO EACH OF YOUR  FOLLOW-UP OFFICE VISITS.  * KEEP GROIN SITE CLEAN AND DRY. Call the office for any signs of bleedings, pus, swelling, increased pain, or any other concerns.  * Please follow up with your primary care provider to have your INR levels checked at the end of this week or beginning of next week.     Follow-up Information    Follow up with ROBERTS, Vernie Ammons, MD. Schedule  an appointment as soon as possible for a visit in 1 week. (Please have your INR checked this Friday or the following Monday.)       Follow up with Tereso Newcomer, PA on 10/09/2011. (11:30)    Contact information:   [facility] [address] [place] [address] [phone]       Follow up with Gypsy Balsam, RN on 09/26/2011. (10:40)    Contact information:   [facility] [address], [place] [address] [phone]           Outstanding Labs/Studies: Needs INR check in the next week.  Duration of Discharge Encounter: Greater than 30 minutes including physician and PA time.  Signed, HOPE, JESSICA PA-C 09/24/2011, 1:57 PM

## 2011-09-24 NOTE — Progress Notes (Signed)
Patient ID: Angela [name], female   DOB: 11-30-1945, 66 y.o.   MRN: [id number] Subjective:  No chest pain or sob.  Objective:  Vital Signs in the last 24 hours: Temp:  [97.9 F (36.6 C)-98.2 F (36.8 C)] 98.2 F (36.8 C) (01/29 0500) Pulse Rate:  [60-86] 80  (01/29 0500) Resp:  [16-20] 16  (01/29 0500) BP: (79-143)/(49-82) 95/76 mmHg (01/29 0500) SpO2:  [99 %-100 %] 100 % (01/29 0500) Weight:  [52.935 kg (116 lb 11.2 oz)] 52.935 kg (116 lb 11.2 oz) (01/29 0500)  Intake/Output from previous day: 01/28 0701 - 01/29 0700 In: 246 [P.O.:240; I.V.:6] Out: 600 [Urine:600] Intake/Output from this shift: Total I/O In: 240 [P.O.:240] Out: -   Physical Exam: Well appearing NAD HEENT: Unremarkable Neck:  No JVD, no thyromegally Lungs:  Clear with no wheezes, rales, or rhonchi HEART:  Regular rate rhythm, no murmurs, no rubs, no clicks Abd:  Flat, positive bowel sounds, no organomegally, no rebound, no guarding Ext:  2 plus pulses, no edema, no cyanosis, no clubbing Skin:  No rashes no nodules Neuro:  CN II through XII intact, motor grossly intact  Lab Results: No results found for this basename: WBC:2,HGB:2,PLT:2 in the last 72 hours  Basename 09/24/11 0535 09/23/11 0545  NA 142 143  K 3.5 3.6  CL 106 104  CO2 26 27  GLUCOSE 92 94  BUN 18 22  CREATININE 0.98 1.03    Basename 09/22/11 0902 09/22/11 0059  TROPONINI <0.30 <0.30   Hepatic Function Panel No results found for this basename: PROT,ALBUMIN,AST,ALT,ALKPHOS,BILITOT,BILIDIR,IBILI in the last 72 hours No results found for this basename: CHOL in the last 72 hours No results found for this basename: PROTIME in the last 72 hours  Imaging: No results found.  Cardiac Studies: Tele - atrial fib with ventricular pacing. Assessment/Plan:  1. Acute/chronic systolic CHF - she appears euvolemic. Will plan to discharge later today. 2. Atrial fib with an RVR - she is s/p AV node ablation and is BiV pacing. 3. Hypokalemia -  will replete her potassium 4. Disp. - will discharge home and followup with Mr. Alben Spittle in 2 weeks. Will add cozaar 25 mg at discharge.  LOS: 3 days    Lewayne Bunting 09/24/2011, 8:48 AM

## 2011-09-26 ENCOUNTER — Ambulatory Visit (INDEPENDENT_AMBULATORY_CARE_PROVIDER_SITE_OTHER): Payer: No Typology Code available for payment source | Admitting: *Deleted

## 2011-09-26 DIAGNOSIS — I4891 Unspecified atrial fibrillation: Secondary | ICD-10-CM

## 2011-09-26 DIAGNOSIS — I495 Sick sinus syndrome: Secondary | ICD-10-CM

## 2011-09-27 ENCOUNTER — Encounter: Payer: Self-pay | Admitting: Internal Medicine

## 2011-09-27 LAB — REMOTE ICD DEVICE
BAMS-0001: 180 {beats}/min
BATTERY VOLTAGE: 2.62 V
BRDY-0002RV: 80 {beats}/min
BRDY-0004RV: 120 {beats}/min
DEVICE MODEL ICD: 539576
HV IMPEDENCE: 50 Ohm
LV LEAD IMPEDENCE ICD: 330 Ohm
RV LEAD AMPLITUDE: 11.8 mv
RV LEAD IMPEDENCE ICD: 490 Ohm
TZAT-0001SLOWVT: 1
TZAT-0004SLOWVT: 8
TZAT-0012SLOWVT: 200 ms
TZAT-0013SLOWVT: 2
TZAT-0018SLOWVT: NEGATIVE
TZAT-0019SLOWVT: 7.5 V
TZAT-0020SLOWVT: 1 ms
TZON-0003SLOWVT: 345 ms
TZON-0004SLOWVT: 30
TZON-0005SLOWVT: 6
TZON-0010SLOWVT: 80 ms
TZST-0001SLOWVT: 2
TZST-0001SLOWVT: 3
TZST-0001SLOWVT: 4
TZST-0003SLOWVT: 830 V
TZST-0003SLOWVT: 830 V
TZST-0003SLOWVT: 830 V
VENTRICULAR PACING ICD: 88 pct

## 2011-10-02 NOTE — Progress Notes (Signed)
Remote defib check  

## 2011-10-09 ENCOUNTER — Encounter: Payer: Self-pay | Admitting: Physician Assistant

## 2011-10-09 ENCOUNTER — Ambulatory Visit (INDEPENDENT_AMBULATORY_CARE_PROVIDER_SITE_OTHER): Payer: No Typology Code available for payment source | Admitting: *Deleted

## 2011-10-09 ENCOUNTER — Encounter: Payer: Self-pay | Admitting: Internal Medicine

## 2011-10-09 ENCOUNTER — Ambulatory Visit (INDEPENDENT_AMBULATORY_CARE_PROVIDER_SITE_OTHER): Payer: No Typology Code available for payment source | Admitting: Physician Assistant

## 2011-10-09 VITALS — BP 102/73 | HR 81 | Ht 67.0 in | Wt 138.0 lb

## 2011-10-09 DIAGNOSIS — I099 Rheumatic heart disease, unspecified: Secondary | ICD-10-CM

## 2011-10-09 DIAGNOSIS — I442 Atrioventricular block, complete: Secondary | ICD-10-CM

## 2011-10-09 DIAGNOSIS — I509 Heart failure, unspecified: Secondary | ICD-10-CM

## 2011-10-09 DIAGNOSIS — R0989 Other specified symptoms and signs involving the circulatory and respiratory systems: Secondary | ICD-10-CM

## 2011-10-09 DIAGNOSIS — I4891 Unspecified atrial fibrillation: Secondary | ICD-10-CM

## 2011-10-09 DIAGNOSIS — I5022 Chronic systolic (congestive) heart failure: Secondary | ICD-10-CM

## 2011-10-09 LAB — ICD DEVICE OBSERVATION
BAMS-0001: 180 {beats}/min
DEVICE MODEL ICD: 539576
TZAT-0001SLOWVT: 1
TZAT-0004SLOWVT: 8
TZAT-0012SLOWVT: 200 ms
TZAT-0013SLOWVT: 2
TZAT-0018SLOWVT: NEGATIVE
TZAT-0019SLOWVT: 7.5 V
TZAT-0020SLOWVT: 1 ms
TZON-0003SLOWVT: 345 ms
TZON-0004SLOWVT: 30
TZON-0005SLOWVT: 6
TZON-0010SLOWVT: 80 ms
TZST-0001SLOWVT: 2
TZST-0001SLOWVT: 3
TZST-0001SLOWVT: 4
TZST-0003SLOWVT: 830 V
TZST-0003SLOWVT: 830 V
TZST-0003SLOWVT: 830 V

## 2011-10-09 LAB — BASIC METABOLIC PANEL
BUN: 31 mg/dL — ABNORMAL HIGH (ref 6–23)
CO2: 20 mEq/L (ref 19–32)
Calcium: 9.2 mg/dL (ref 8.4–10.5)
Chloride: 106 mEq/L (ref 96–112)
Creatinine, Ser: 0.9 mg/dL (ref 0.4–1.2)
GFR: 79.16 mL/min (ref >60.00)
Glucose, Bld: 69 mg/dL — ABNORMAL LOW (ref 70–99)
Potassium: 4.8 mEq/L (ref 3.5–5.1)
Sodium: 136 mEq/L (ref 135–145)

## 2011-10-09 NOTE — Assessment & Plan Note (Signed)
Now s/p AV nodal ablation due to difficult to control rates.  Device was interrogated and her rate was turned down to 70.  Coumadin is managed by her PCP.  Follow up with Dr. Lewayne Bunting in 3-4 weeks.

## 2011-10-09 NOTE — Patient Instructions (Signed)
Your physician recommends that you schedule a follow-up appointment in: 3-4 weeks with Dr Ladona Ridgel Your physician recommends that you return for lab work in: today (BMP)

## 2011-10-09 NOTE — Assessment & Plan Note (Signed)
Bjork-Shiley MVR functioning normally by recent Echo.

## 2011-10-09 NOTE — Progress Notes (Signed)
[address] [place], [place]  [address] Phone: [phone] Fax:  [phone]  Date:  10/09/2011   Name:  Angela [name]       DOB:  01-13-1946 MRN:  [id number]  PCP:  Dr. Burton Apley Primary Cardiologist:  Dr. Lewayne Bunting  Primary Electrophysiologist:  Dr. Lewayne Bunting    History of Present Illness: Angela [name] is a 66 y.o. female who presents for post hospital follow up.  She has a history of chronic systolic heart failure in the setting of nonischemic cardiomyopathy, SSS, s/p pacemaker with upgrade to a BiV-ICD after surviving VFib arrest in 2009, rheumatic heart disease, status post mitral valve replacement with a Bjork-Shiley prosthesis, chronic Coumadin therapy, Paroxysmal atrial fibrillation, s/p right femal embolectomy in 2003.  LHC 4/03: Normal coronary arteries.    She was admitted 1/26-1/29 with acute on chronic systolic heart failure in the setting of atrial fibrillation with rapid ventricular rate.  Rate control was initially achieved with IV diltiazem.  She continued to have episodes of atrial fibrillation with rapid rate and it was decided to pursue AV nodal ablation.  She has had difficult to control atrial fibrillation in the past.  Echocardiogram 09/22/11: EF 20%, MVR normal, moderate LAE, mild RAE.  She was diuresed with IV Lasix.  She underwent the procedure 09/23/11.  She was initially set at VVI with a rate of 80.  Cozaar was initiated at discharge.  Labs: Hemoglobin 16, potassium 3.5, creatinine 0.98, TSH 1.158.  Chest x-ray 09/21/11: Cardiac enlargement, emphysematous changes, no acute pulmonary process.  Since d/c, she is doing well.  Breathing is better.  No chest pain.  No orthopnea, PND.  She has mild pedal edema without change.  Weights fairly stable at home.  No syncope.  No fatigue or lightheadedness.    Past Medical History  Diagnosis Date  . Nonischemic cardiomyopathy     A. 11/2001 - Cath - NL Cors;  B. 01/2008 Echo - EF 10-20%;   [name]  Atrial fibrillation     A.  Chronic Coumadin  . Diverticular disease   . History of colon cancer     A.  1999 - T3, N1  chemotherapy  . Internal hemorrhoids   . Rheumatic heart disease     A. 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 Sick sinus syndrome     A.  s/p pacer in 1995.  B.    . Cardiac arrest - ventricular fibrillation     A.  12/2007;  B. 01/2008 St. Jude Promote Bi-V ICD placed  . Systolic CHF, chronic     A.  01/9628 Echo - EF 10-20%  . Embolus and thrombosis of iliac artery     A.  12/2001 iliofemoral embolus s/p r fem embolectomy    Current Outpatient Prescriptions  Medication Sig Dispense Refill  . carvedilol (COREG) 3.125 MG tablet Take 1 tablet (3.125 mg total) by mouth 2 (two) times daily with a meal.  60 tablet  6  . digoxin (LANOXIN) 0.125 MG tablet Take 1/2 tab (0.0625mg ) daily      . furosemide (LASIX) 40 MG tablet Take 40 mg by mouth daily.      [name] losartan (COZAAR) 25 MG tablet Take 1 tablet (25 mg total) by mouth daily.  30 tablet  6  . PATADAY 0.2 % SOLN       . potassium chloride (KLOR-CON) 10 MEQ CR tablet Take 10 mEq by mouth daily.        [name]  warfarin (COUMADIN) 1 MG tablet Take 4 tablets (4 mg total) by mouth daily. Take 4 of the 1mg  tabs with the 5mg  tab to total 9mg  on Sunday, Tuesday, Thursday, Friday, and Saturday. On Monday and Wednesday you should take 1 of the 1mg  tabs with the 5mg  tab to total 6mg .  90 tablet  3  . warfarin (COUMADIN) 5 MG tablet Take 1 tablet (5 mg total) by mouth daily. Take 1 of the 5mg  tabs daily to equal 6mg  on Monday and Wednesday and to equal 9mg  on the other days.  30 tablet  3  . pentoxifylline (TRENTAL) 400 MG CR tablet         Allergies: No Known Allergies  History  Substance Use Topics  . Smoking status: Never Smoker   . Smokeless tobacco: Not on file  . Alcohol Use: No     ROS:  Please see the history of present illness.   No fever, cough, diarrhea.   All other systems reviewed and negative.   PHYSICAL EXAM: VS:  BP  102/73  Pulse 81  Ht 5\' 7"  (1.702 m)  Wt 138 lb (62.596 kg)  BMI 21.61 kg/m2 Well nourished, well developed, in no acute distress HEENT: normal Neck: no JVD Cardiac:  mechanical S1, normal S2; RRR; no murmur Lungs:  clear to auscultation bilaterally, no wheezing, rhonchi or rales Abd: soft, nontender, no hepatomegaly Ext: no edema; right groin without hematoma or bruit  Skin: warm and dry Neuro:  CNs 2-12 intact, no focal abnormalities noted  EKG:  V Paced, HR 83, PVC  ASSESSMENT AND PLAN:

## 2011-10-09 NOTE — Assessment & Plan Note (Signed)
Volume stable.  ARB started in hospital.  Check BMET today.  BP too soft to titrate meds further at this time.

## 2011-10-10 ENCOUNTER — Telehealth: Payer: Self-pay | Admitting: *Deleted

## 2011-10-10 DIAGNOSIS — I4891 Unspecified atrial fibrillation: Secondary | ICD-10-CM

## 2011-10-10 NOTE — Telephone Encounter (Signed)
Message copied by Tarri Fuller on Thu Oct 10, 2011 12:12 PM ------      Message from: [place], [place] T      Created: Thu Oct 10, 2011 11:19 AM       Hold Lasix and K+ for ONE DAY ONLY      Then resume      Repeat BMET in 10 days      Tereso Newcomer, PA-C  11:19 AM 10/10/2011

## 2011-10-10 NOTE — Telephone Encounter (Signed)
pt aware of lab results and to hold lasix and K+ x 1 day, pt already ttok meds today so she will hold meds 10/11/11 and resume on 10/12/11, pt will have repeat bmet 10/24/11, pt gave verbal understanding today. Danielle Rankin

## 2011-10-24 ENCOUNTER — Ambulatory Visit (INDEPENDENT_AMBULATORY_CARE_PROVIDER_SITE_OTHER): Payer: No Typology Code available for payment source | Admitting: *Deleted

## 2011-10-24 DIAGNOSIS — I4891 Unspecified atrial fibrillation: Secondary | ICD-10-CM

## 2011-10-24 LAB — BASIC METABOLIC PANEL
BUN: 22 mg/dL (ref 6–23)
CO2: 26 mEq/L (ref 19–32)
Calcium: 9 mg/dL (ref 8.4–10.5)
Chloride: 102 mEq/L (ref 96–112)
Creatinine, Ser: 1 mg/dL (ref 0.4–1.2)
GFR: 70.99 mL/min (ref >60.00)
Glucose, Bld: 65 mg/dL — ABNORMAL LOW (ref 70–99)
Potassium: 3.8 mEq/L (ref 3.5–5.1)
Sodium: 137 mEq/L (ref 135–145)

## 2011-11-11 ENCOUNTER — Ambulatory Visit (INDEPENDENT_AMBULATORY_CARE_PROVIDER_SITE_OTHER): Payer: No Typology Code available for payment source | Admitting: Internal Medicine

## 2011-11-11 ENCOUNTER — Encounter: Payer: Self-pay | Admitting: Internal Medicine

## 2011-11-11 VITALS — BP 110/64 | HR 56 | Wt 139.0 lb

## 2011-11-11 DIAGNOSIS — I5022 Chronic systolic (congestive) heart failure: Secondary | ICD-10-CM

## 2011-11-11 DIAGNOSIS — I4901 Ventricular fibrillation: Secondary | ICD-10-CM | POA: Insufficient documentation

## 2011-11-11 DIAGNOSIS — I428 Other cardiomyopathies: Secondary | ICD-10-CM

## 2011-11-11 DIAGNOSIS — I4891 Unspecified atrial fibrillation: Secondary | ICD-10-CM

## 2011-11-11 DIAGNOSIS — I509 Heart failure, unspecified: Secondary | ICD-10-CM

## 2011-11-11 LAB — ICD DEVICE OBSERVATION
ATRIAL PACING ICD: 0 pct
BAMS-0001: 180 {beats}/min
BATTERY VOLTAGE: 2.5869 V
DEVICE MODEL ICD: 539576
FVT: 0
HV IMPEDENCE: 41 Ohm
LV LEAD IMPEDENCE ICD: 287.5 Ohm
LV LEAD THRESHOLD: 1 V
MODE SWITCH EPISODES: 0
PACEART VT: 0
RV LEAD AMPLITUDE: 9 mv
RV LEAD IMPEDENCE ICD: 450 Ohm
RV LEAD THRESHOLD: 1 V
TOT-0006: 20090618000000
TOT-0007: 2
TOT-0008: 0
TOT-0009: 1
TOT-0010: 36
TZAT-0001SLOWVT: 1
TZAT-0004SLOWVT: 8
TZAT-0012SLOWVT: 200 ms
TZAT-0013SLOWVT: 2
TZAT-0018SLOWVT: NEGATIVE
TZAT-0019SLOWVT: 7.5 V
TZAT-0020SLOWVT: 1 ms
TZON-0003SLOWVT: 345 ms
TZON-0004SLOWVT: 30
TZON-0005SLOWVT: 6
TZON-0010SLOWVT: 80 ms
TZST-0001SLOWVT: 2
TZST-0001SLOWVT: 3
TZST-0001SLOWVT: 4
TZST-0003SLOWVT: 830 V
TZST-0003SLOWVT: 830 V
TZST-0003SLOWVT: 830 V
VENTRICULAR PACING ICD: 91 pct
VF: 3

## 2011-11-11 NOTE — Assessment & Plan Note (Signed)
She has had no recurrent ventricular arrhythmias. She will continue her current medical therapy. 

## 2011-11-11 NOTE — Assessment & Plan Note (Signed)
Her symptoms are currently class II. She will continue her current medical therapy, maintain a low-sodium diet, and continued to increase her physical activity.

## 2011-11-11 NOTE — Assessment & Plan Note (Signed)
Her ventricular rate is well controlled. She will continue her current medical therapy. 

## 2011-11-11 NOTE — Patient Instructions (Addendum)
Remote monitoring is used to monitor your Pacemaker of ICD from home. This monitoring reduces the number of office visits required to check your device to one time per year. It allows us to keep an eye on the functioning of your device to ensure it is working properly. You are scheduled for a device check from home on February 13, 2012. You may send your transmission at any time that day. If you have a wireless device, the transmission will be sent automatically. After your physician reviews your transmission, you will receive a postcard with your next transmission date.  Your physician wants you to follow-up in: 1 year.  You will receive a reminder letter in the mail two months in advance. If you don't receive a letter, please call our office to schedule the follow-up appointment.  

## 2011-11-11 NOTE — Progress Notes (Signed)
HPI Mrs. Aydin returns today for followup. She is a 66 year old woman with a history of a nonischemic cardiomyopathy, sudden cardiac death due to ventricular fibrillation status post resuscitation, chronic systolic heart failure, left bundle branch block, status post bi-V ICD.  The patient had very difficult to control ventricular rates and underwent AV node ablation. She is improved. She has not been hospitalized with heart failure. She denies peripheral edema. No Known Allergies   Current Outpatient Prescriptions  Medication Sig Dispense Refill  . carvedilol (COREG) 3.125 MG tablet Take 1 tablet (3.125 mg total) by mouth 2 (two) times daily with a meal.  60 tablet  6  . digoxin (LANOXIN) 0.125 MG tablet Take 1/2 tab (0.0625mg ) daily      . furosemide (LASIX) 40 MG tablet Take 40 mg by mouth daily.      [name] losartan (COZAAR) 25 MG tablet Take 1 tablet (25 mg total) by mouth daily.  30 tablet  6  . PATADAY 0.2 % SOLN Place into both eyes daily.       . pentoxifylline (TRENTAL) 400 MG CR tablet       . potassium chloride (KLOR-CON) 10 MEQ CR tablet Take 10 mEq by mouth daily.        [name] warfarin (COUMADIN) 1 MG tablet Take 4 tablets (4 mg total) by mouth daily. Take 4 of the 1mg  tabs with the 5mg  tab to total 9mg  on Sunday, Tuesday, Thursday, Friday, and Saturday. On Monday and Wednesday you should take 1 of the 1mg  tabs with the 5mg  tab to total 6mg .  90 tablet  3  . warfarin (COUMADIN) 5 MG tablet Take 1 tablet (5 mg total) by mouth daily. Take 1 of the 5mg  tabs daily to equal 6mg  on Monday and Wednesday and to equal 9mg  on the other days.  30 tablet  3     Past Medical History  Diagnosis Date  . Nonischemic cardiomyopathy     A. 11/2001 - Cath - NL Cors;  B. 01/2008 Echo - EF 10-20%;   [name] Atrial fibrillation     A.  Chronic Coumadin  . Diverticular disease   . History of colon cancer     A.  1999 - T3, N1  chemotherapy  . Internal hemorrhoids   . Rheumatic heart disease     A. 1983 s/p   Bjork-Shiley MVR  . Sick sinus syndrome     A.  s/p pacer in 1995.  B.    . Cardiac arrest - ventricular fibrillation     A.  12/2007;  B. 01/2008 St. Jude Promote Bi-V ICD placed  . Systolic CHF, chronic     A.  08/6107 Echo - EF 10-20%  . Embolus and thrombosis of iliac artery     A.  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 CAD (coronary artery disease)   . CVA (cerebral vascular accident)   . DVT (deep venous thrombosis)   . Cardiomyopathy     ROS:   All systems reviewed and negative except as noted in the HPI.   Past Surgical History  Procedure Date  . Mitral valve replacement, bjork-shiley valve 1983  . Biventricular aicd   . Colon surgery 1999  . History of echocardiogram 2003, 2007, 2009     Family History  Problem Relation Age of Onset  . Diabetes Mother 4  . Cancer Father 92    Lung Cancer and smoker  . Coronary artery disease Mother  s/p cabg  . Coronary artery disease Sister     s/p cabg     History   Social History  . Marital Status: Married    Spouse Name: N/A    Number of Children: 1  . Years of Education: N/A   Occupational History  . Retired    Social History Main Topics  . Smoking status: Never Smoker   . Smokeless tobacco: Not on file  . Alcohol Use: No  . Drug Use: No  . Sexually Active: No   Other Topics Concern  . Not on file   Social History Narrative   The patient is married and has 1 child.  Lives in [place] with husband.  She is retired from working at [employer].  She does not smoke or drink     BP 110/64  Pulse 56  Wt 63.05 kg (139 lb)  Physical Exam:  Well appearing middle-aged woman, NAD HEENT: Unremarkable Neck:  No JVD, no thyromegally Lungs:  Clear with no wheezes, rales, or rhonchi. HEART:  Regular rate rhythm, no murmurs, no rubs, no clicks Abd:  soft, positive bowel sounds, no organomegally, no rebound, no guarding Ext:  2 plus pulses, no edema, no cyanosis, no clubbing Skin:  No  rashes no nodules Neuro:  CN II through XII intact, motor grossly intact  DEVICE  Normal device function.  See PaceArt for details.   Assess/Plan:

## 2011-12-26 ENCOUNTER — Encounter: Payer: No Typology Code available for payment source | Admitting: *Deleted

## 2011-12-31 ENCOUNTER — Encounter: Payer: Self-pay | Admitting: *Deleted

## 2012-01-03 IMAGING — CR DG CHEST 2V
2 series · 2 of 2 positions shown · non-contrast
Comparison: CT scan from 10/24/2010.

CLINICAL DATA: Shortness of breath.  Question pneumonia.

CHEST - 2 VIEW

[w chest pa]
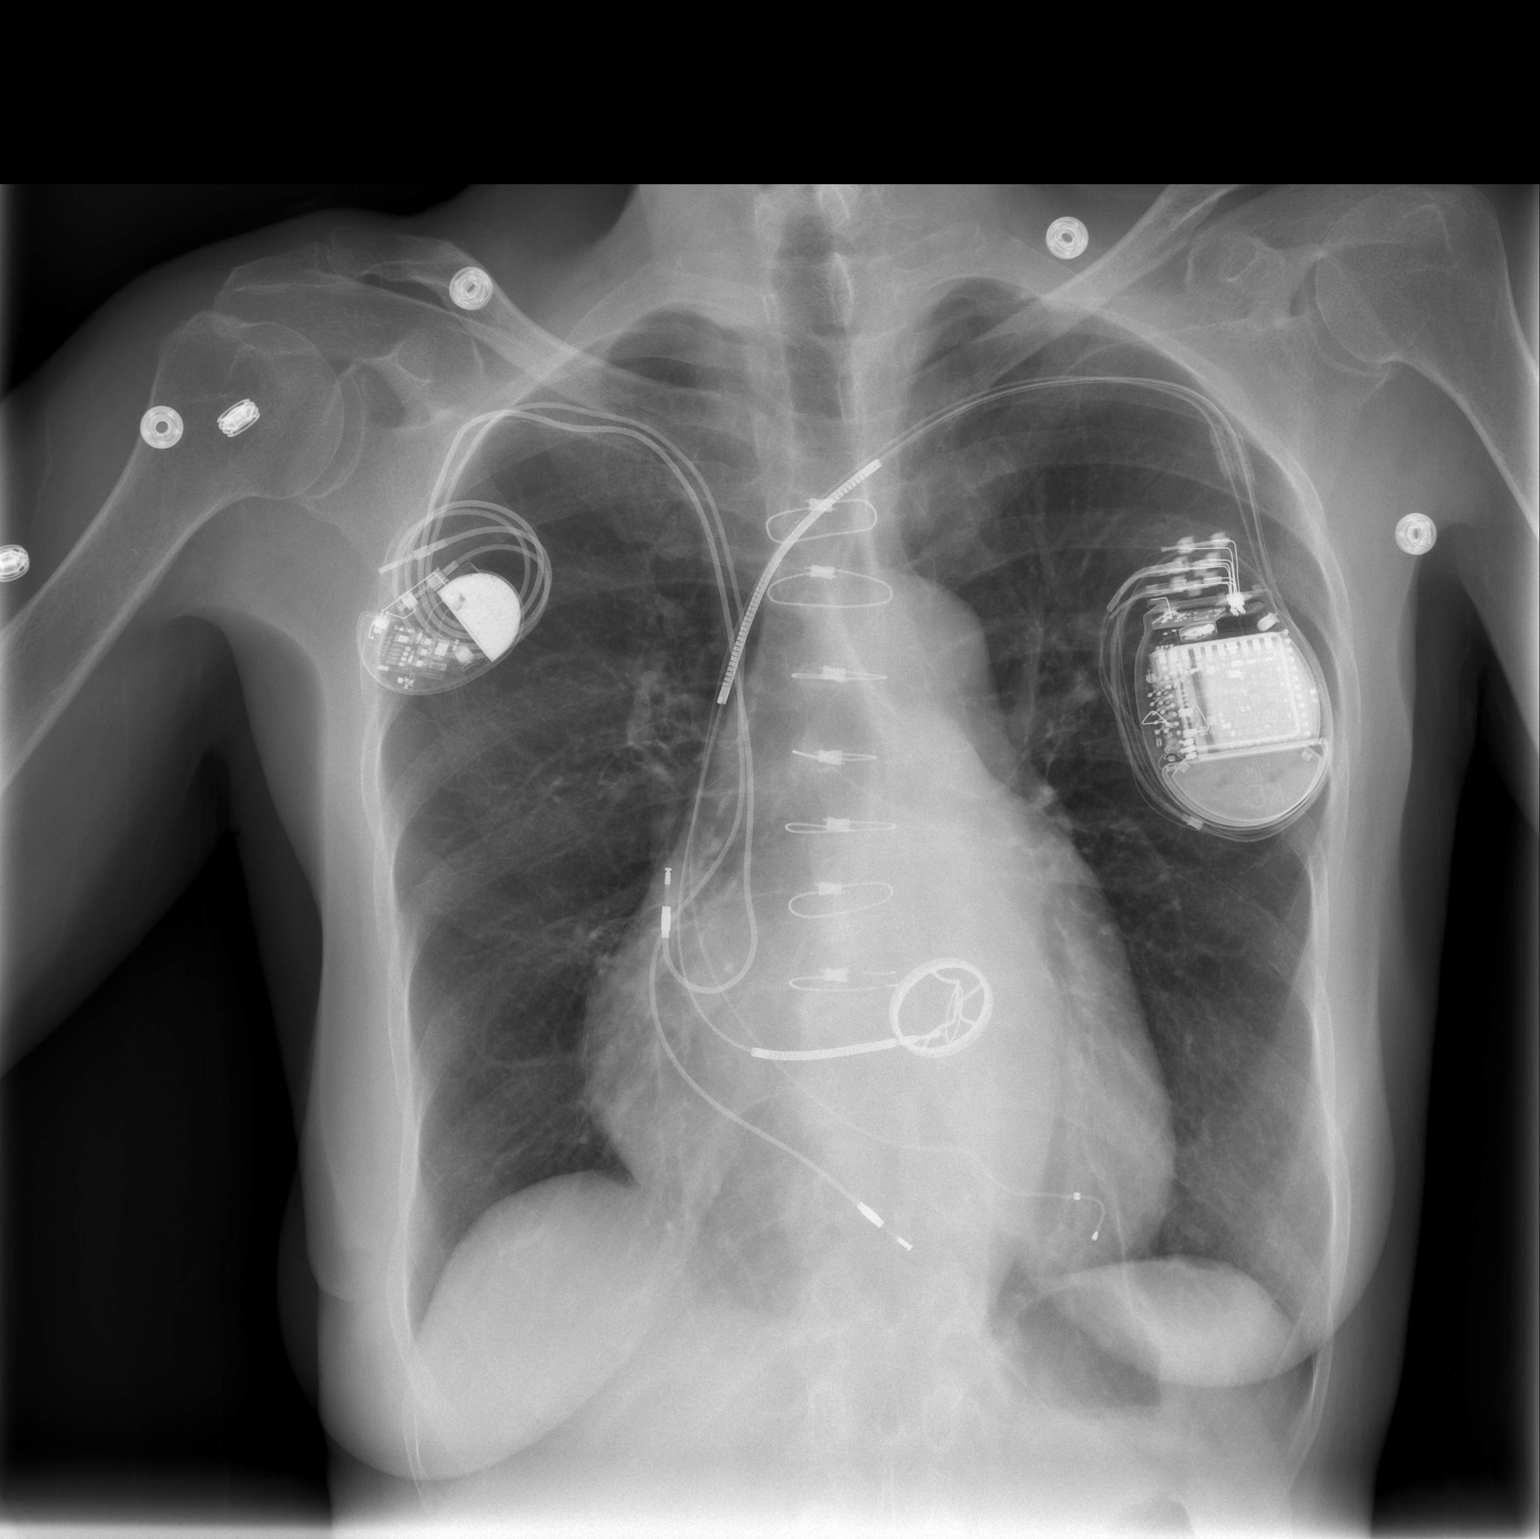

[w chest lat]
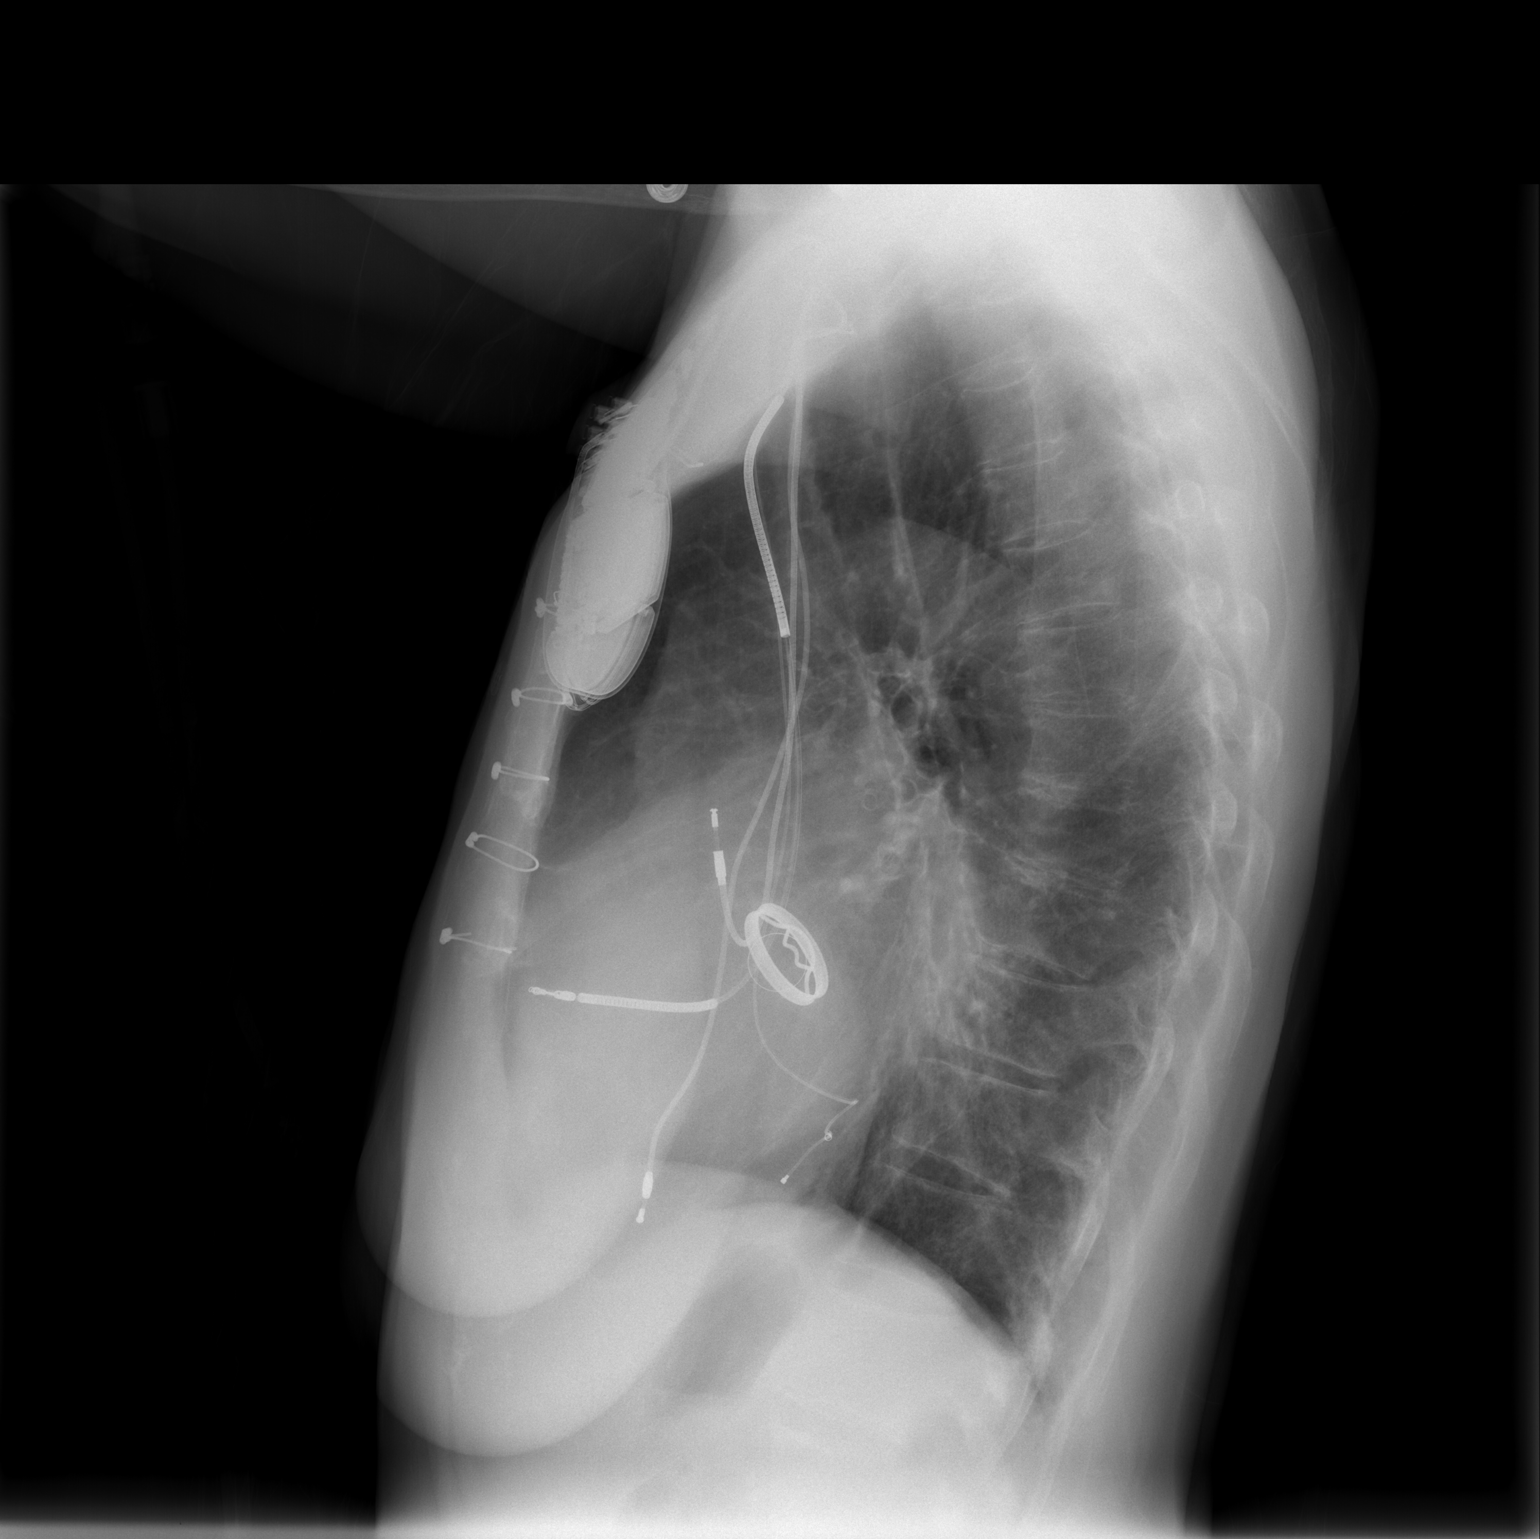

[2 of 2 positions shown; findings below may reference images not displayed]

FINDINGS: Hyperexpansion is consistent with emphysema. The
cardiopericardial silhouette is enlarged.  No focal airspace
consolidation or pulmonary edema.  The patient is status post
aortic valve replacement.  Left-sided dual lead AICD noted that the
patient also as a right-sided dual lead permanent pacemaker. Bones
are diffusely demineralized.
IMPRESSION: Cardiomegaly with emphysema.  No acute cardiopulmonary process.

## 2012-02-04 ENCOUNTER — Other Ambulatory Visit: Payer: Self-pay | Admitting: Internal Medicine

## 2012-02-13 ENCOUNTER — Ambulatory Visit (INDEPENDENT_AMBULATORY_CARE_PROVIDER_SITE_OTHER): Payer: No Typology Code available for payment source | Admitting: *Deleted

## 2012-02-13 ENCOUNTER — Encounter: Payer: Self-pay | Admitting: Internal Medicine

## 2012-02-13 DIAGNOSIS — I4901 Ventricular fibrillation: Secondary | ICD-10-CM

## 2012-02-17 LAB — REMOTE ICD DEVICE
BAMS-0001: 180 {beats}/min
BATTERY VOLTAGE: 2.59 V
DEVICE MODEL ICD: 539576
HV IMPEDENCE: 49 Ohm
LV LEAD IMPEDENCE ICD: 330 Ohm
RV LEAD IMPEDENCE ICD: 450 Ohm
TZAT-0001SLOWVT: 1
TZAT-0004SLOWVT: 8
TZAT-0012SLOWVT: 200 ms
TZAT-0013SLOWVT: 2
TZAT-0018SLOWVT: NEGATIVE
TZAT-0019SLOWVT: 7.5 V
TZAT-0020SLOWVT: 1 ms
TZON-0003SLOWVT: 345 ms
TZON-0004SLOWVT: 30
TZON-0005SLOWVT: 6
TZON-0010SLOWVT: 80 ms
TZST-0001SLOWVT: 2
TZST-0001SLOWVT: 3
TZST-0001SLOWVT: 4
TZST-0003SLOWVT: 830 V
TZST-0003SLOWVT: 830 V
TZST-0003SLOWVT: 830 V
VENTRICULAR PACING ICD: 89 pct

## 2012-05-22 ENCOUNTER — Other Ambulatory Visit: Payer: Self-pay | Admitting: Internal Medicine

## 2012-05-22 DIAGNOSIS — Z1231 Encounter for screening mammogram for malignant neoplasm of breast: Secondary | ICD-10-CM

## 2012-06-16 ENCOUNTER — Ambulatory Visit
Admission: RE | Admit: 2012-06-16 | Discharge: 2012-06-16 | Disposition: A | Discharge status: Home / Self Care | Payer: No Typology Code available for payment source | Source: Ambulatory Visit | Attending: Internal Medicine | Admitting: Internal Medicine

## 2012-06-16 DIAGNOSIS — Z1231 Encounter for screening mammogram for malignant neoplasm of breast: Secondary | ICD-10-CM

## 2012-07-06 ENCOUNTER — Telehealth: Payer: Self-pay | Admitting: Internal Medicine

## 2012-07-06 ENCOUNTER — Encounter: Payer: Self-pay | Admitting: Internal Medicine

## 2012-07-06 ENCOUNTER — Ambulatory Visit (INDEPENDENT_AMBULATORY_CARE_PROVIDER_SITE_OTHER): Payer: No Typology Code available for payment source | Admitting: Internal Medicine

## 2012-07-06 VITALS — BP 130/80 | HR 70 | Ht 67.0 in | Wt 151.0 lb

## 2012-07-06 DIAGNOSIS — R0602 Shortness of breath: Secondary | ICD-10-CM

## 2012-07-06 DIAGNOSIS — I4891 Unspecified atrial fibrillation: Secondary | ICD-10-CM

## 2012-07-06 LAB — CBC WITH DIFFERENTIAL/PLATELET
Basophils Absolute: 0 10*3/uL (ref 0.0–0.1)
Basophils Relative: 0.6 % (ref 0.0–3.0)
Eosinophils Absolute: 0 10*3/uL (ref 0.0–0.7)
Eosinophils Relative: 0.6 % (ref 0.0–5.0)
HCT: 43.3 % (ref 36.0–46.0)
Hemoglobin: 14.1 g/dL (ref 12.0–15.0)
Lymphocytes Relative: 22.6 % (ref 12.0–46.0)
Lymphs Abs: 0.9 10*3/uL (ref 0.7–4.0)
MCHC: 32.5 g/dL (ref 30.0–36.0)
MCV: 90.1 fl (ref 78.0–100.0)
Monocytes Absolute: 0.3 10*3/uL (ref 0.1–1.0)
Monocytes Relative: 7.8 % (ref 3.0–12.0)
Neutro Abs: 2.8 10*3/uL (ref 1.4–7.7)
Neutrophils Relative %: 68.4 % (ref 43.0–77.0)
Platelets: 149 10*3/uL — ABNORMAL LOW (ref 150.0–400.0)
RBC: 4.8 Mil/uL (ref 3.87–5.11)
RDW: 16.3 % — ABNORMAL HIGH (ref 11.5–14.6)
WBC: 4.1 10*3/uL — ABNORMAL LOW (ref 4.5–10.5)

## 2012-07-06 LAB — BASIC METABOLIC PANEL
BUN: 19 mg/dL (ref 6–23)
CO2: 24 mEq/L (ref 19–32)
Calcium: 9.2 mg/dL (ref 8.4–10.5)
Chloride: 106 mEq/L (ref 96–112)
Creatinine, Ser: 0.9 mg/dL (ref 0.4–1.2)
GFR: 84.31 mL/min (ref >60.00)
Glucose, Bld: 89 mg/dL (ref 70–99)
Potassium: 4.9 mEq/L (ref 3.5–5.1)
Sodium: 139 mEq/L (ref 135–145)

## 2012-07-06 LAB — PROTIME-INR
INR: 3.5 ratio — ABNORMAL HIGH (ref 0.8–1.0)
Prothrombin Time: 36.5 s — ABNORMAL HIGH (ref 10.2–12.4)

## 2012-07-06 LAB — BRAIN NATRIURETIC PEPTIDE: Pro B Natriuretic peptide (BNP): 1893 pg/mL — ABNORMAL HIGH (ref 0.0–100.0)

## 2012-07-06 NOTE — Addendum Note (Signed)
Addended by: Pricilla Riffle on: 07/06/2012 11:44 PM   Modules accepted: Level of Service

## 2012-07-06 NOTE — Progress Notes (Signed)
HPI Patient is a 66 year old who is normally followed by Rosette Reveal  She was last seen in March.  She has a history of NICM, SCD (s/p resuscitations), LBBB.  She is s/p biV ICD.  She is s/p Mallie Mussel MVR. She presents today with increased SOB since Sunday  She denies fevers, chills or productive cough.  Her wt has gone up at home about 5 lbs.  She denies CP  Is more comfortable propped up.   No Known Allergies  Current Outpatient Prescriptions  Medication Sig Dispense Refill  . carvedilol (COREG) 12.5 MG tablet Take 12.5 mg by mouth 2 (two) times daily with a meal.      . digoxin (LANOXIN) 0.125 MG tablet Take 1/2 tab (0.0625mg ) daily      . losartan (COZAAR) 25 MG tablet Take 1 tablet (25 mg total) by mouth daily.  30 tablet  6  . potassium chloride (KLOR-CON) 10 MEQ CR tablet Take 10 mEq by mouth daily.        [name] warfarin (COUMADIN) 1 MG tablet Take 4 tablets (4 mg total) by mouth daily. Take 4 of the 1mg  tabs with the 5mg  tab to total 9mg  on Sunday, Tuesday, Thursday, Friday, and Saturday. On Monday and Wednesday you should take 1 of the 1mg  tabs with the 5mg  tab to total 6mg .  90 tablet  3  . warfarin (COUMADIN) 5 MG tablet Take 1 tablet (5 mg total) by mouth daily. Take 1 of the 5mg  tabs daily to equal 6mg  on Monday and Wednesday and to equal 9mg  on the other days.  30 tablet  3    Past Medical History  Diagnosis Date  . Nonischemic cardiomyopathy     A. 11/2001 - Cath - NL Cors;  B. 01/2008 Echo - EF 10-20%;   [name] Atrial fibrillation     A.  Chronic Coumadin  . Diverticular disease   . History of colon cancer     A.  1999 - T3, N1  chemotherapy  . Internal hemorrhoids   . Rheumatic heart disease     A. 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 Sick sinus syndrome     A.  s/p pacer in 1995.  B.    . Cardiac arrest - ventricular fibrillation     A.  12/2007;  B. 01/2008 St. Jude Promote Bi-V ICD placed  . Systolic CHF, chronic     A.  11/979 Echo - EF 10-20%  . Embolus and thrombosis of iliac  artery     A.  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 CAD (coronary artery disease)   . CVA (cerebral vascular accident)   . DVT (deep venous thrombosis)   . Cardiomyopathy     Past Surgical History  Procedure Date  . Mitral valve replacement, bjork-shiley valve 1983  . Biventricular aicd   . Colon surgery 1999  . History of echocardiogram 2003, 2007, 2009    Family History  Problem Relation Age of Onset  . Diabetes Mother 81  . Cancer Father 56    Lung Cancer and smoker  . Coronary artery disease Mother     s/p cabg  . Coronary artery disease Sister     s/p cabg    History   Social History  . Marital Status: Married    Spouse Name: N/A    Number of Children: 1  . Years of Education: N/A   Occupational History  . Retired  Social History Main Topics  . Smoking status: Never Smoker   . Smokeless tobacco: Not on file  . Alcohol Use: No  . Drug Use: No  . Sexually Active: No   Other Topics Concern  . Not on file   Social History Narrative   The patient is married and has 1 child.  Lives in [place] with husband.  She is retired from working at [employer].  She does not smoke or drink    Review of Systems:  All systems reviewed.  They are negative to the above problem except as previously stated.  Vital Signs: BP 130/80  Pulse 70  Ht 5\' 7"  (1.702 m)  Wt 151 lb (68.493 kg)  BMI 23.65 kg/m2 O2 sat on RA 95% Physical Exam Patient is in NAD HEENT:  Normocephalic, atraumatic. EOMI, PERRLA.  Neck: JVP is normal.  No bruits.  Lungs: No rales  Upper airway wheeze Heart: Regular rate and rhythm. Crisp valve sounds.   No significant murmurs. PMI not displaced.  Abdomen:  Supple, nontender. Normal bowel sounds. No masses. No hepatomegaly.  Extremities:   Good distal pulses throughout.  TR lower extremity edema.  Musculoskeletal :moving all extremities.  Neuro:   alert and oriented x3.  CN II-XII grossly intact.  EKG  Ventricular paced.  73  bpm. Assessment and Plan:  1.  NICM  Volume may be up a ltitle.  The patient's husband thinks she is on a diuretic but did not bring in.  WIll check BMET, BNP  Will need to confirm meds  2.  Hx CVA, afib  Continue coumadin  Check INR and CBC  3.  Afib  S/p Biv ICD  4.

## 2012-07-06 NOTE — Telephone Encounter (Signed)
New problem:    C/o sob during the night.

## 2012-07-06 NOTE — Telephone Encounter (Signed)
Spoke with pt. Pt states starting yesterday she developed SOB when lying down. She states she has some increased SOB with exertion but mostly she is SOB when she tries to lie down. She thinks her weight is up but she does not weigh daily. She denies increase in edema or other swelling. I will review with Dr Tenny Craw (DOD).

## 2012-07-06 NOTE — Patient Instructions (Addendum)
Lab work today We will call you with results. 

## 2012-07-06 NOTE — Telephone Encounter (Signed)
Reviewed with Dr Tenny Craw. She will see pt in the office today. Pt advised to come to office for appt with Dr Tenny Craw this morning.

## 2012-07-07 ENCOUNTER — Emergency Department (HOSPITAL_COMMUNITY): Payer: No Typology Code available for payment source

## 2012-07-07 ENCOUNTER — Encounter (HOSPITAL_COMMUNITY): Payer: Self-pay | Admitting: *Deleted

## 2012-07-07 ENCOUNTER — Inpatient Hospital Stay (HOSPITAL_COMMUNITY)
Admission: EM | Admit: 2012-07-07 | Discharge: 2012-07-12 | DRG: 292 | Disposition: A | Discharge status: Home / Self Care | Payer: No Typology Code available for payment source | Attending: Cardiology | Admitting: Cardiology

## 2012-07-07 DIAGNOSIS — I495 Sick sinus syndrome: Secondary | ICD-10-CM

## 2012-07-07 DIAGNOSIS — I959 Hypotension, unspecified: Secondary | ICD-10-CM | POA: Diagnosis not present

## 2012-07-07 DIAGNOSIS — I5023 Acute on chronic systolic (congestive) heart failure: Principal | ICD-10-CM | POA: Diagnosis present

## 2012-07-07 DIAGNOSIS — I2510 Atherosclerotic heart disease of native coronary artery without angina pectoris: Secondary | ICD-10-CM | POA: Diagnosis present

## 2012-07-07 DIAGNOSIS — M129 Arthropathy, unspecified: Secondary | ICD-10-CM | POA: Diagnosis present

## 2012-07-07 DIAGNOSIS — I4729 Other ventricular tachycardia: Secondary | ICD-10-CM | POA: Diagnosis present

## 2012-07-07 DIAGNOSIS — Z7901 Long term (current) use of anticoagulants: Secondary | ICD-10-CM

## 2012-07-07 DIAGNOSIS — Z8673 Personal history of transient ischemic attack (TIA), and cerebral infarction without residual deficits: Secondary | ICD-10-CM

## 2012-07-07 DIAGNOSIS — Z86718 Personal history of other venous thrombosis and embolism: Secondary | ICD-10-CM

## 2012-07-07 DIAGNOSIS — I472 Ventricular tachycardia, unspecified: Secondary | ICD-10-CM | POA: Diagnosis present

## 2012-07-07 DIAGNOSIS — Z8719 Personal history of other diseases of the digestive system: Secondary | ICD-10-CM

## 2012-07-07 DIAGNOSIS — Z9221 Personal history of antineoplastic chemotherapy: Secondary | ICD-10-CM

## 2012-07-07 DIAGNOSIS — I5022 Chronic systolic (congestive) heart failure: Secondary | ICD-10-CM

## 2012-07-07 DIAGNOSIS — Z8674 Personal history of sudden cardiac arrest: Secondary | ICD-10-CM

## 2012-07-07 DIAGNOSIS — R791 Abnormal coagulation profile: Secondary | ICD-10-CM | POA: Diagnosis not present

## 2012-07-07 DIAGNOSIS — Z954 Presence of other heart-valve replacement: Secondary | ICD-10-CM

## 2012-07-07 DIAGNOSIS — Z79899 Other long term (current) drug therapy: Secondary | ICD-10-CM

## 2012-07-07 DIAGNOSIS — I509 Heart failure, unspecified: Secondary | ICD-10-CM | POA: Diagnosis present

## 2012-07-07 DIAGNOSIS — Z95810 Presence of automatic (implantable) cardiac defibrillator: Secondary | ICD-10-CM

## 2012-07-07 DIAGNOSIS — I428 Other cardiomyopathies: Secondary | ICD-10-CM | POA: Diagnosis present

## 2012-07-07 DIAGNOSIS — Z85038 Personal history of other malignant neoplasm of large intestine: Secondary | ICD-10-CM

## 2012-07-07 DIAGNOSIS — I099 Rheumatic heart disease, unspecified: Secondary | ICD-10-CM

## 2012-07-07 DIAGNOSIS — I952 Hypotension due to drugs: Secondary | ICD-10-CM

## 2012-07-07 DIAGNOSIS — E876 Hypokalemia: Secondary | ICD-10-CM

## 2012-07-07 DIAGNOSIS — I4891 Unspecified atrial fibrillation: Secondary | ICD-10-CM | POA: Diagnosis present

## 2012-07-07 DIAGNOSIS — T45515A Adverse effect of anticoagulants, initial encounter: Secondary | ICD-10-CM | POA: Diagnosis not present

## 2012-07-07 DIAGNOSIS — Z952 Presence of prosthetic heart valve: Secondary | ICD-10-CM

## 2012-07-07 DIAGNOSIS — Z8249 Family history of ischemic heart disease and other diseases of the circulatory system: Secondary | ICD-10-CM

## 2012-07-07 HISTORY — DX: Unspecified osteoarthritis, unspecified site: M19.90

## 2012-07-07 LAB — CBC
HCT: 42.4 % (ref 36.0–46.0)
Hemoglobin: 14.3 g/dL (ref 12.0–15.0)
MCH: 29.8 pg (ref 26.0–34.0)
MCHC: 33.7 g/dL (ref 30.0–36.0)
MCV: 88.3 fL (ref 78.0–100.0)
Platelets: 155 10*3/uL (ref 150–400)
RBC: 4.8 MIL/uL (ref 3.87–5.11)
RDW: 15.3 % (ref 11.5–15.5)
WBC: 3.6 10*3/uL — ABNORMAL LOW (ref 4.0–10.5)

## 2012-07-07 LAB — POCT I-STAT, CHEM 8
BUN: 23 mg/dL (ref 6–23)
Calcium, Ion: 1.1 mmol/L — ABNORMAL LOW (ref 1.13–1.30)
Chloride: 108 mEq/L (ref 96–112)
Creatinine, Ser: 0.9 mg/dL (ref 0.50–1.10)
Glucose, Bld: 104 mg/dL — ABNORMAL HIGH (ref 70–99)
HCT: 45 % (ref 36.0–46.0)
Hemoglobin: 15.3 g/dL — ABNORMAL HIGH (ref 12.0–15.0)
Potassium: 4.5 mEq/L (ref 3.5–5.1)
Sodium: 141 mEq/L (ref 135–145)
TCO2: 21 mmol/L (ref 0–100)

## 2012-07-07 LAB — PROTIME-INR
INR: 2.8 — ABNORMAL HIGH (ref 0.00–1.49)
Prothrombin Time: 28.1 seconds — ABNORMAL HIGH (ref 11.6–15.2)

## 2012-07-07 LAB — POCT I-STAT TROPONIN I: Troponin i, poc: 0.06 ng/mL (ref 0.00–0.08)

## 2012-07-07 LAB — PRO B NATRIURETIC PEPTIDE: Pro B Natriuretic peptide (BNP): 14071 pg/mL — ABNORMAL HIGH (ref 0–125)

## 2012-07-07 LAB — DIGOXIN LEVEL: Digoxin Level: <0.3 ng/mL — ABNORMAL LOW (ref 0.8–2.0)

## 2012-07-07 MED ORDER — WARFARIN SODIUM 6 MG PO TABS
6.0000 mg | ORAL_TABLET | ORAL | Status: DC
  Administered 2012-07-08: 6 mg via ORAL
  Filled 2012-07-07: qty 1

## 2012-07-07 MED ORDER — DIGOXIN 0.0625 MG HALF TABLET
0.0625 mg | ORAL_TABLET | Freq: Every day | ORAL | Status: DC
  Administered 2012-07-07 – 2012-07-11 (×4): 0.0625 mg via ORAL
  Filled 2012-07-07 (×6): qty 1

## 2012-07-07 MED ORDER — DIGOXIN 0.0625 MG HALF TABLET: 0.0625 mg | ORAL_TABLET | Freq: Every day | ORAL | Status: DC

## 2012-07-07 MED ORDER — CARVEDILOL 12.5 MG PO TABS
12.5000 mg | ORAL_TABLET | Freq: Two times a day (BID) | ORAL | Status: DC
  Administered 2012-07-07 – 2012-07-09 (×4): 12.5 mg via ORAL
  Filled 2012-07-07 (×6): qty 1

## 2012-07-07 MED ORDER — ASPIRIN 81 MG PO CHEW
324.0000 mg | CHEWABLE_TABLET | Freq: Once | ORAL | Status: AC
  Administered 2012-07-07: 324 mg via ORAL
  Filled 2012-07-07: qty 4

## 2012-07-07 MED ORDER — LOSARTAN POTASSIUM 50 MG PO TABS: 50.0000 mg | ORAL_TABLET | Freq: Every day | ORAL | Status: DC

## 2012-07-07 MED ORDER — SODIUM CHLORIDE 0.9 % IJ SOLN
3.0000 mL | Freq: Two times a day (BID) | INTRAMUSCULAR | Status: DC
  Administered 2012-07-07 – 2012-07-11 (×10): 3 mL via INTRAVENOUS

## 2012-07-07 MED ORDER — POTASSIUM CHLORIDE CRYS ER 10 MEQ PO TBCR
10.0000 meq | EXTENDED_RELEASE_TABLET | Freq: Every day | ORAL | Status: DC
  Filled 2012-07-07: qty 1

## 2012-07-07 MED ORDER — WARFARIN SODIUM 6 MG PO TABS
9.0000 mg | ORAL_TABLET | ORAL | Status: DC
  Administered 2012-07-07: 9 mg via ORAL
  Filled 2012-07-07 (×2): qty 1

## 2012-07-07 MED ORDER — ACETAMINOPHEN 325 MG PO TABS: 650.0000 mg | ORAL_TABLET | ORAL | Status: DC | PRN

## 2012-07-07 MED ORDER — ONDANSETRON HCL 4 MG/2ML IJ SOLN: 4.0000 mg | Freq: Four times a day (QID) | INTRAMUSCULAR | Status: DC | PRN

## 2012-07-07 MED ORDER — NITROGLYCERIN 0.4 MG SL SUBL: 0.4000 mg | SUBLINGUAL_TABLET | SUBLINGUAL | Status: DC | PRN

## 2012-07-07 MED ORDER — LOSARTAN POTASSIUM 50 MG PO TABS
50.0000 mg | ORAL_TABLET | Freq: Every day | ORAL | Status: DC
  Administered 2012-07-07 – 2012-07-08 (×2): 50 mg via ORAL
  Filled 2012-07-07 (×3): qty 1

## 2012-07-07 MED ORDER — FUROSEMIDE 10 MG/ML IJ SOLN
40.0000 mg | Freq: Two times a day (BID) | INTRAMUSCULAR | Status: DC
  Administered 2012-07-07: 40 mg via INTRAVENOUS
  Filled 2012-07-07 (×2): qty 4

## 2012-07-07 MED ORDER — SODIUM CHLORIDE 0.9 % IJ SOLN: 3.0000 mL | INTRAMUSCULAR | Status: DC | PRN

## 2012-07-07 MED ORDER — SODIUM CHLORIDE 0.9 % IV SOLN: 250.0000 mL | INTRAVENOUS | Status: DC | PRN

## 2012-07-07 MED ORDER — WARFARIN - PHARMACIST DOSING INPATIENT
Freq: Every day | Status: DC
  Administered 2012-07-07: 18:00:00

## 2012-07-07 NOTE — ED Notes (Signed)
Pt states the last couple of nights she has been feeling SOB when she lays down or walks for a period of time. Pt has increased swelling to left lower leg.

## 2012-07-07 NOTE — Progress Notes (Signed)
Utilization Review Completed.   Kimberly Tucker, RN, BSN Nurse Case Manager  [phone]  

## 2012-07-07 NOTE — ED Provider Notes (Signed)
History     CSN: [id number]  Arrival date & time 07/07/12  0416   First MD Initiated Contact with Patient 07/07/12 0435      Chief Complaint  Patient presents with  . Shortness of Breath    (Consider location/radiation/quality/duration/timing/severity/associated sxs/prior treatment) HPI Hx per PT. Increased SOB last 2 days, feels like she may have some wt gain and swelling. She called her cardiologist yesterday and was evaluated in the clinic and was sent home - no med changes, unable to relay if she was told anything with regard to he symptoms. Today she has persistent SOB and now some substernal CP that does not radiate. She is on coumadin for afib and cardiomyopathy. No home O2. No missed medications. No F/C. No cough. Symptoms worse with exertion, taking all medications as prescribed.  Symptoms moderate in severity.  Past Medical History  Diagnosis Date  . Nonischemic cardiomyopathy     A. 11/2001 - Cath - NL Cors;  B. 01/2008 Echo - EF 10-20%;   [name] Atrial fibrillation     A.  Chronic Coumadin  . Diverticular disease   . History of colon cancer     A.  1999 - T3, N1  chemotherapy  . Internal hemorrhoids   . Rheumatic heart disease     A. 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 Sick sinus syndrome     A.  s/p pacer in 1995.  B.    . Cardiac arrest - ventricular fibrillation     A.  12/2007;  B. 01/2008 St. Jude Promote Bi-V ICD placed  . Systolic CHF, chronic     A.  04/6294 Echo - EF 10-20%  . Embolus and thrombosis of iliac artery     A.  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 CAD (coronary artery disease)   . CVA (cerebral vascular accident)   . DVT (deep venous thrombosis)   . Cardiomyopathy     Past Surgical History  Procedure Date  . Mitral valve replacement, bjork-shiley valve 1983  . Biventricular aicd   . Colon surgery 1999  . History of echocardiogram 2003, 2007, 2009    Family History  Problem Relation Age of Onset  . Diabetes Mother 62  . Cancer  Father 60    Lung Cancer and smoker  . Coronary artery disease Mother     s/p cabg  . Coronary artery disease Sister     s/p cabg    History  Substance Use Topics  . Smoking status: Never Smoker   . Smokeless tobacco: Not on file  . Alcohol Use: No    OB History    Grav Para Term Preterm Abortions TAB SAB Ect Mult Living                  Review of Systems  Constitutional: Negative for fever and chills.  HENT: Negative for neck pain and neck stiffness.   Eyes: Negative for pain.  Respiratory: Positive for shortness of breath.   Cardiovascular: Positive for chest pain.  Gastrointestinal: Negative for vomiting and abdominal pain.  Genitourinary: Negative for dysuria.  Musculoskeletal: Negative for back pain.  Skin: Negative for rash.  Neurological: Negative for headaches.  All other systems reviewed and are negative.    Allergies  Review of patient's allergies indicates no known allergies.  Home Medications   Current Outpatient Rx  Name  Route  Sig  Dispense  Refill  . CARVEDILOL 12.5 MG PO TABS  Oral   Take 12.5 mg by mouth 2 (two) times daily with a meal.         . DIGOXIN 0.125 MG PO TABS      Take 1/2 tab (0.0625mg ) daily         . LOSARTAN POTASSIUM 25 MG PO TABS   Oral   Take 1 tablet (25 mg total) by mouth daily.   30 tablet   6   . POTASSIUM CHLORIDE 10 MEQ PO TBCR   Oral   Take 10 mEq by mouth daily.           . WARFARIN SODIUM 1 MG PO TABS   Oral   Take 4 tablets (4 mg total) by mouth daily. Take 4 of the 1mg  tabs with the 5mg  tab to total 9mg  on Sunday, Tuesday, Thursday, Friday, and Saturday. On Monday and Wednesday you should take 1 of the 1mg  tabs with the 5mg  tab to total 6mg .   90 tablet   3   . WARFARIN SODIUM 5 MG PO TABS   Oral   Take 1 tablet (5 mg total) by mouth daily. Take 1 of the 5mg  tabs daily to equal 6mg  on Monday and Wednesday and to equal 9mg  on the other days.   30 tablet   3     BP 148/82  Pulse 74  Temp  98.1 F (36.7 C) (Oral)  Resp 22  SpO2 100%  Physical Exam  Constitutional: She is oriented to person, place, and time. She appears well-developed and well-nourished.  HENT:  Head: Normocephalic and atraumatic.  Eyes: Conjunctivae normal and EOM are normal. Pupils are equal, round, and reactive to light.  Neck: Trachea normal. Neck supple. No thyromegaly present.  Cardiovascular: S1 normal, S2 normal, intact distal pulses and normal pulses.     No systolic murmur is present   No diastolic murmur is present  Pulses:      Radial pulses are 2+ on the right side, and 2+ on the left side.       irregular  Pulmonary/Chest: Effort normal. No respiratory distress. She has no rhonchi. She exhibits no tenderness.       Dec breath sounds bilat  Abdominal: Soft. Normal appearance and bowel sounds are normal. There is no tenderness. There is no CVA tenderness and negative Murphy's sign.  Musculoskeletal:       Calves nontender, no cords or erythema, 1 plus pretibial edema.   Neurological: She is alert and oriented to person, place, and time. She has normal strength. No cranial nerve deficit or sensory deficit. GCS eye subscore is 4. GCS verbal subscore is 5. GCS motor subscore is 6.  Skin: Skin is warm and dry. No rash noted. She is not diaphoretic.  Psychiatric: Her speech is normal.       Cooperative and appropriate    ED Course  Procedures (including critical care time)  Results for orders placed during the hospital encounter of 07/07/12  CBC      Component Value Range   WBC 3.6 (*) 4.0 - 10.5 K/uL   RBC 4.80  3.87 - 5.11 MIL/uL   Hemoglobin 14.3  12.0 - 15.0 g/dL   HCT 29.5  28.4 - 13.2 %   MCV 88.3  78.0 - 100.0 fL   MCH 29.8  26.0 - 34.0 pg   MCHC 33.7  30.0 - 36.0 g/dL   RDW 44.0  10.2 - 72.5 %   Platelets 155  150 - 400 K/uL  PROTIME-INR      Component Value Range   Prothrombin Time 28.1 (*) 11.6 - 15.2 seconds   INR 2.80 (*) 0.00 - 1.49  POCT I-STAT TROPONIN I       Component Value Range   Troponin i, poc 0.06  0.00 - 0.08 ng/mL   Comment 3           POCT I-STAT, CHEM 8      Component Value Range   Sodium 141  135 - 145 mEq/L   Potassium 4.5  3.5 - 5.1 mEq/L   Chloride 108  96 - 112 mEq/L   BUN 23  6 - 23 mg/dL   Creatinine, Ser 4.54  0.50 - 1.10 mg/dL   Glucose, Bld 098 (*) 70 - 99 mg/dL   Calcium, Ion 1.19 (*) 1.13 - 1.30 mmol/L   TCO2 21  0 - 100 mmol/L   Hemoglobin 15.3 (*) 12.0 - 15.0 g/dL   HCT 14.7  82.9 - 56.2 %   Dg Chest Portable 1 View  07/07/2012  *RADIOLOGY REPORT*  Clinical Data: Shortness of breath.  PORTABLE CHEST - 1 VIEW  Comparison: 09/21/2011  Findings: Left AICD and right pacer remain in place, unchanged. Prior median sternotomy and valve replacement.  Cardiomegaly.  Mild hyperinflation of the lungs.  Chronic densities in the left mid and lower lung, likely scarring.  No acute opacities or effusions.  No overt edema.  IMPRESSION: Cardiomegaly.  Emphysema/chronic changes.  No active disease.   Original Report Authenticated By: Charlett Nose, M.D.     Date: 07/07/2012  Rate: 77  Rhythm: paced  QRS Axis: indeterminate  Intervals: paced  ST/T Wave abnormalities: nonspecific ST/T changes  Conduction Disutrbances:nonspecific intraventricular conduction delay  Narrative Interpretation:   Old EKG Reviewed: unchanged  6:20 AM d/w CAR DR Jens Som will see PT in the ED.   CAR to admit for diuresis  MDM   SOB/ CP h/o NICM on coumadin. EF 20 % by review of records. No florid heart failure clinically but is having increasing symptoms. ECG, labs and CXR. CAR consult and ED evaluation.         Sunnie Nielsen, MD 07/07/12 [id number]

## 2012-07-07 NOTE — H&P (Signed)
History and Physical  Patient ID: Angela [name] MRN: [id number], SOB: 1946/08/25 66 y.o. Date of Encounter: 07/07/2012, 7:24 AM  Primary Physician: Lorenda Peck, MD Primary Cardiologist: Dr. Ladona Ridgel  Chief Complaint: shortness of breath  HPI: 66 y.o. female w/ PMHx significant for NICM (normal coronaries by cath 2003, EF 20% by echo 08/2011), V. Fib arrest (2009, s/p BiV ICD), A.fib (s/p AV nodal ablation), Rheumatic heart dz (s/p Bjork-Shiley MVR '83, on coumadin), Iliofemoral embolus (s/p R femoral embolectomy '03), and CVA who presented to [facility] on 07/07/2012 with complaints of shortness of breath.  Echo in 08/2011 showed severely dilated LV w/ diffuse hypokinesis,  EF 20%, normal functioning MV, mod LAE, mild RAE. She was seen in clinic yesterday with complaints of increased shortness of breath, orthopnea, pedal edema, and weight gain. She does not weigh herself every day, but thinks she has gained weight. Weight was 151lbs in the office. She had labs drawn and was to follow up with current med list in order to adjust diuretics. BNP was 1893. She presented to the ED today due to worsening shortness of breath and orthopnea. She is normally able to lay flat in bed, but since Sunday has had to sleep sitting up. Also more short of breath with activities around the house. She reports compliance with all medications. Avoids salt. No chest pain, ICD shock, cough, fever, chills, abdominal pain, melena/hematochezia.  In the ED EKG reveals V-paced rhythm. CXR is without acute cardiopulmonary changes. Labs are significant for normal poc troponin, WBC 3.6, Hgb 14.3, INR 2.8, unremarkable BMET. Her breathing is unlabored on nasal cannula.    Past Medical History  Diagnosis Date  . Nonischemic cardiomyopathy     A. 11/2001 - Cath - NL Cors;  B. 01/2008 Echo - EF 10-20%;   [name] Atrial fibrillation     A.  Chronic Coumadin  . Diverticular disease   . History of colon cancer     A.  1999  - T3, N1  chemotherapy  . Internal hemorrhoids   . Rheumatic heart disease     A. 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 Sick sinus syndrome     A.  s/p pacer in 1995.  B.    . Cardiac arrest - ventricular fibrillation     A.  12/2007;  B. 01/2008 St. Jude Promote Bi-V ICD placed  . Systolic CHF, chronic     A.  01/2129 Echo - EF 10-20%  . Embolus and thrombosis of iliac artery     A.  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 CAD (coronary artery disease)   . CVA (cerebral vascular accident)   . DVT (deep venous thrombosis)   . Cardiomyopathy     Echo 08/2011 Study Conclusions: - Left ventricle: The cavity size was severely dilated. Wall thickness was normal. The estimated ejection fraction was 20%. Diffuse hypokinesis.  - Mitral valve: Normal appearing mechanical MVR. Normal diastolic gradient and no peri valvular leak - Left atrium: The atrium was moderately dilated. - Right atrium: The atrium was mildly dilated. - Atrial septum: No defect or patent foramen ovale was identified  Cardiac Cath 2003 CORONARY CINE ANGIOGRAPHY:  Left coronary artery: The ostium and left main appeared normal.  Left anterior descending: The left anterior descending appears normal.   Circumflex coronary artery: The circumflex coronary artery appears normal.  Right coronary artery: The right coronary artery appears normal.  LEFT VENTRICULAR CINEANGIOGRAM: The left ventricular chamber size  is mild to moderate to severely enlarged. The left ventricular contractility is severely decreased. There is a discoordinate contractility probably due to pacemaker activity originating in the right ventricle. There is generalized hypokinesia and an overall ejection fraction is severely decreased at approximately 15-20%. There is a Bjork-Shiley prosthetic mitral valve which has moderate rocking of its supporting structure, however, there is no significant mitral insufficiency noted. There was a trace seen. The valve appears to  have very normal mobility and opening during diastole.  FINAL DIAGNOSES:  1. Normal coronary arteries.  2. Severe dilated cardiomyopathy with severe left ventricular hypokinesia with discoordinate contractility secondary to pacing and ejection fraction estimated at between 15-20%.  3. Successful Perclose of the right femoral artery.   Surgical History:  Past Surgical History  Procedure Date  . Mitral valve replacement, bjork-shiley valve 1983  . Biventricular aicd   . Colon surgery 1999  . History of echocardiogram 2003, 2007, 2009     Home Meds: Medication Sig  carvedilol (COREG) 12.5 MG tablet Take 12.5 mg by mouth 2 (two) times daily with a meal.  digoxin (LANOXIN) 0.125 MG tablet Take 1/2 tab (0.0625mg ) daily  furosemide (LASIX) 40 MG tablet Take 40 mg by mouth daily.  losartan (COZAAR) 25 MG tablet Take 1 tablet (25 mg total) by mouth daily.  potassium chloride (KLOR-CON) 10 MEQ CR tablet Take 10 mEq by mouth daily.    warfarin (COUMADIN) 1 MG tablet Take 1-4 mg by mouth daily. Take 1 tablet with coumadin 5mg  on Monday and Wednesday then take 4 tablets all the other days with coumadin 5mg   warfarin (COUMADIN) 5 MG tablet Take 5 mg by mouth daily.    Allergies: No Known Allergies  History   Social History  . Marital Status: Married    Spouse Name: N/A    Number of Children: 1  . Years of Education: N/A   Occupational History  . Retired    Social History Main Topics  . Smoking status: Never Smoker   . Smokeless tobacco: Not on file  . Alcohol Use: No  . Drug Use: No  . Sexually Active: No   Other Topics Concern  . Not on file   Social History Narrative   The patient is married and has 1 child.  Lives in [place] with husband.  She is retired from working at [employer].  She does not smoke or drink     Family History  Problem Relation Age of Onset  . Diabetes Mother 63  . Cancer Father 61    Lung Cancer and smoker  . Coronary artery disease Mother      s/p cabg  . Coronary artery disease Sister     s/p cabg    Review of Systems: General: negative for chills, fever, night sweats   Cardiovascular: As per HPI  Dermatological: negative for rash Respiratory: negative for cough or wheezing Urologic: negative for hematuria Abdominal: negative for nausea, vomiting, diarrhea, bright red blood per rectum, melena, or hematemesis Neurologic: negative for visual changes, syncope, or dizziness All other systems reviewed and are otherwise negative except as noted above.  Labs:   Component Value Date   WBC 3.6* 07/07/2012   HGB 15.3* 07/07/2012   HCT 45.0 07/07/2012   MCV 88.3 07/07/2012   PLT 155 07/07/2012    Lab 07/07/12 0514 07/06/12 1238  NA 141 --  K 4.5 --  CL 108 --  CO2 -- 24  BUN 23 --  CREATININE 0.90 --  CALCIUM -- 9.2  PROT -- --  BILITOT -- --  ALKPHOS -- --  ALT -- --  AST -- --  GLUCOSE 104* --     07/07/2012 05:13  Troponin i, poc 0.06     07/07/2012 05:10  Prothrombin Time 28.1 (H)  INR 2.80 (H)    Radiology/Studies:   07/07/2012 - PORTABLE CHEST - 1 VIEW   Findings: Left AICD and right pacer remain in place, unchanged. Prior median sternotomy and valve replacement.  Cardiomegaly.  Mild hyperinflation of the lungs.  Chronic densities in the left mid and lower lung, likely scarring.  No acute opacities or effusions.  No overt edema.  IMPRESSION: Cardiomegaly.  Emphysema/chronic changes.  No active disease.       EKG: 07/07/12 @ 0426 - V-paced 77 w/ underlying A.Fib  Physical Exam: Blood pressure 120/89, pulse 75, temperature 98.1 F (36.7 C), temperature source Oral, resp. rate 22, SpO2 100.00%. General: Elderly frail black female in no acute distress. Head: Normocephalic, atraumatic, sclera non-icteric, nares are without discharge Neck: Supple. Negative for carotid bruits. (++) JVD. Lungs: Fine bibasilar rales; No wheezes or rhonchi. Breathing is unlabored. Heart: RRR with mechanical S1, normal S2. No  murmurs, rubs, or gallops appreciated. Abdomen: Soft, non-tender, non-distended with normoactive bowel sounds. No rebound/guarding. No obvious abdominal masses. Msk:  Strength and tone appear normal for age. Extremities: Trace to 1+ bilat ankle edema. No clubbing or cyanosis. Distal pedal pulses are intact and equal bilaterally. Neuro: Alert and oriented X 3. Moves all extremities spontaneously. Psych:  Responds to questions appropriately with a normal affect.    ASSESSMENT AND PLAN:  66 y.o. female w/ PMHx significant for NICM (normal coronaries by cath 2003, EF 20% by echo 08/2011), V. Fib arrest (2009, s/p BiV ICD), A.fib (s/p AV nodal ablation), Rheumatic heart dz (s/p Bjork-Shiley MVR '83, on coumadin), Iliofemoral embolus (s/p R femoral embolectomy '03), and CVA who presented to [facility] on 07/07/2012 with complaints of shortness of breath.  See MD note below  Signed, HOPE, JESSICA PA-C 07/07/2012, 7:24 AM  As above, patient seen and examined. Briefly 66 year old female with past medical history of nonischemic cardiomyopathy, prior biventricular ICD, prior ventricular fibrillation arrest, permanent atrial fibrillation status post AV node ablation, rheumatic heart disease status post mechanical mitral valve, DVT and CVA admitted with congestive heart failure. Patient complains of increased dyspnea on exertion, orthopnea, PND and pedal edema over the past 3 days. There is no fevers, chills, productive cough, hemoptysis or chest pain. She was seen in the office yesterday but her symptoms worsened and she presented to the emergency room today. Physical exam shows jugular venous distention, mild basilar crackles and trace pedal edema. Electrocardiogram shows ventricular pacing with underlying atrial fibrillation. Plan to admit and diurese. We'll treat with Lasix 40 mg IV twice a day. Follow potassium and renal function. Increase Cozaar to 50 mg daily. Probable discharge in 24-48 hours  pending improvement in congestive heart failure. Note patient is compliant with her medications and diet. Olga Millers 7:24 AM

## 2012-07-07 NOTE — ED Notes (Signed)
MD at bedside.  Cardiologist.

## 2012-07-07 NOTE — ED Notes (Signed)
Pt reports SOB increasing w/exertion, pt reports increase SOB while lying down, pt sts "I can't put enough pillows behind me when I lay down." Pt also reports LLE swelling

## 2012-07-07 NOTE — Progress Notes (Signed)
ANTICOAGULATION CONSULT NOTE - Initial Consult  Pharmacy Consult for Coumadin Indication: atrial fibrillation and Bjork-Shiley MVR  No Known Allergies  Patient Measurements:    Vital Signs: Temp: 98.1 F (36.7 C) (11/12 0419) Temp src: Oral (11/12 0419) BP: 134/88 mmHg (11/12 0800) Pulse Rate: 102  (11/12 0800)  Labs:  Basename 07/07/12 0514 07/07/12 0510 07/06/12 1238  HGB 15.3* 14.3 --  HCT 45.0 42.4 43.3  PLT -- 155 149.0*  APTT -- -- --  LABPROT -- 28.1* 36.5*  INR -- 2.80* 3.5*  HEPARINUNFRC -- -- --  CREATININE 0.90 -- 0.9  CKTOTAL -- -- --  CKMB -- -- --  TROPONINI -- -- --    The CrCl is unknown because both a height and weight (above a minimum accepted value) are required for this calculation.   Medical History: Past Medical History  Diagnosis Date  . Nonischemic cardiomyopathy     A. 11/2001 - Cath - NL Cors;  B. 01/2008 Echo - EF 10-20%;   [name] Atrial fibrillation     A.  Chronic Coumadin  . Diverticular disease   . History of colon cancer     A.  1999 - T3, N1  chemotherapy  . Internal hemorrhoids   . Rheumatic heart disease     A. 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 Sick sinus syndrome     A.  s/p pacer in 1995.  B.    . Cardiac arrest - ventricular fibrillation     A.  12/2007;  B. 01/2008 [employer] Promote Bi-V ICD placed  . Systolic CHF, chronic     A.  0/1027 Echo - EF 10-20%  . Embolus and thrombosis of iliac artery     A.  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 CAD (coronary artery disease)   . CVA (cerebral vascular accident)   . DVT (deep venous thrombosis)   . Cardiomyopathy     Medications:  Prescriptions prior to admission  Medication Sig Dispense Refill  . carvedilol (COREG) 12.5 MG tablet Take 12.5 mg by mouth 2 (two) times daily with a meal.      . digoxin (LANOXIN) 0.125 MG tablet Take 1/2 tab (0.0625mg ) daily      . furosemide (LASIX) 40 MG tablet Take 40 mg by mouth daily.      [name] losartan (COZAAR) 25 MG tablet Take 1  tablet (25 mg total) by mouth daily.  30 tablet  6  . potassium chloride (KLOR-CON) 10 MEQ CR tablet Take 10 mEq by mouth daily.        [name] warfarin (COUMADIN) 1 MG tablet Take 1-4 mg by mouth daily. Take 1 tablet with coumadin 5mg  on Monday and Wednesday then take 4 tablets all the other days with coumadin 5mg       . warfarin (COUMADIN) 5 MG tablet Take 5 mg by mouth daily.        Assessment: 66 year old female admitted for CHF exacerbation.  She is on chronic anticoagulation with Coumadin for atrial fibrillation and Bjork-Shiley MVR.  Her INR is 2.8 which is therapeutic for these indications.    Goal of Therapy:  INR 2.5-3.5 for mechanical mitral valve and atrial fibrillation   Plan:  Continue her home Coumadin regimen of 6mg  on Mondays and Wednesdays, and 9mg  all other days. Daily PT/INR monitoring for now.  Estella Husk, Pharm.D., BCPS Clinical Pharmacist  Phone [phone] Pager [phone] 07/07/2012, 11:07 AM

## 2012-07-08 DIAGNOSIS — I5022 Chronic systolic (congestive) heart failure: Secondary | ICD-10-CM

## 2012-07-08 DIAGNOSIS — I099 Rheumatic heart disease, unspecified: Secondary | ICD-10-CM

## 2012-07-08 LAB — BASIC METABOLIC PANEL
BUN: 19 mg/dL (ref 6–23)
CO2: 21 mEq/L (ref 19–32)
Calcium: 9.5 mg/dL (ref 8.4–10.5)
Chloride: 103 mEq/L (ref 96–112)
Creatinine, Ser: 0.92 mg/dL (ref 0.50–1.10)
GFR calc Af Amer: 72 mL/min — ABNORMAL LOW (ref >90)
GFR calc non Af Amer: 63 mL/min — ABNORMAL LOW (ref >90)
Glucose, Bld: 87 mg/dL (ref 70–99)
Potassium: 4.4 mEq/L (ref 3.5–5.1)
Sodium: 141 mEq/L (ref 135–145)

## 2012-07-08 LAB — MAGNESIUM: Magnesium: 1.9 mg/dL (ref 1.5–2.5)

## 2012-07-08 LAB — PROTIME-INR
INR: 3.41 — ABNORMAL HIGH (ref 0.00–1.49)
Prothrombin Time: 32.5 seconds — ABNORMAL HIGH (ref 11.6–15.2)

## 2012-07-08 MED ORDER — POTASSIUM CHLORIDE CRYS ER 20 MEQ PO TBCR
30.0000 meq | EXTENDED_RELEASE_TABLET | Freq: Every day | ORAL | Status: DC
  Filled 2012-07-08: qty 1

## 2012-07-08 MED ORDER — POTASSIUM CHLORIDE CRYS ER 10 MEQ PO TBCR
10.0000 meq | EXTENDED_RELEASE_TABLET | Freq: Every day | ORAL | Status: DC
  Administered 2012-07-08 – 2012-07-09 (×2): 10 meq via ORAL
  Filled 2012-07-08 (×2): qty 1

## 2012-07-08 MED ORDER — FUROSEMIDE 10 MG/ML IJ SOLN
60.0000 mg | Freq: Two times a day (BID) | INTRAMUSCULAR | Status: DC
  Administered 2012-07-08: 60 mg via INTRAVENOUS
  Filled 2012-07-08 (×2): qty 6

## 2012-07-08 NOTE — Progress Notes (Signed)
Patient ID: Angela [name], female   DOB: [birth year], 66 y.o.   MRN: [id number]    Subjective:  Denies SSCP, palpitations or Dyspnea   Objective:  Filed Vitals:   07/07/12 1527 07/07/12 1700 07/07/12 2022 07/08/12 0512  BP:  101/78 108/75 128/86  Pulse: 75 74 68 73  Temp:  97.7 F (36.5 C) 97.2 F (36.2 C) 98.1 F (36.7 C)  TempSrc:  Oral Oral Oral  Resp:  18 18 18   Height:      Weight:    142 lb 6.4 oz (64.592 kg)  SpO2:  100% 99% 99%    Intake/Output from previous day:  Intake/Output Summary (Last 24 hours) at 07/08/12 0816 Last data filed at 07/08/12 0600  Gross per 24 hour  Intake    663 ml  Output   1100 ml  Net   -437 ml    Physical Exam: Affect appropriate Thin black female HEENT: normal Neck supple with no adenopathy JVP normal no bruits no thyromegaly Lungs clear with no wheezing and good diaphragmatic motion Heart:  S1 click mechanical valve /S2 no murmur, no rub, gallop or click PMI enlarged Abdomen: benighn, BS positve, no tenderness, no AAA no bruit.  No HSM or HJR Distal pulses intact with no bruits No edema Neuro non-focal Skin warm and dry No muscular weakness   Lab Results: Basic Metabolic Panel:  Basename 07/07/12 0514 07/06/12 1238  NA 141 139  K 4.5 4.9  CL 108 106  CO2 -- 24  GLUCOSE 104* 89  BUN 23 19  CREATININE 0.90 0.9  CALCIUM -- 9.2  MG -- --  PHOS -- --   Liver Function Tests: No results found for this basename: AST:2,ALT:2,ALKPHOS:2,BILITOT:2,PROT:2,ALBUMIN:2 in the last 72 hours No results found for this basename: LIPASE:2,AMYLASE:2 in the last 72 hours CBC:  Basename 07/07/12 0514 07/07/12 0510 07/06/12 1238  WBC -- 3.6* 4.1*  NEUTROABS -- -- 2.8  HGB 15.3* 14.3 --  HCT 45.0 42.4 --  MCV -- 88.3 90.1  PLT -- 155 149.0*    Imaging: Dg Chest Portable 1 View  07/07/2012  *RADIOLOGY REPORT*  Clinical Data: Shortness of breath.  PORTABLE CHEST - 1 VIEW  Comparison: 09/21/2011  Findings: Left AICD and right pacer  remain in place, unchanged. Prior median sternotomy and valve replacement.  Cardiomegaly.  Mild hyperinflation of the lungs.  Chronic densities in the left mid and lower lung, likely scarring.  No acute opacities or effusions.  No overt edema.  IMPRESSION: Cardiomegaly.  Emphysema/chronic changes.  No active disease.   Original Report Authenticated By: Charlett Nose, M.D.     Cardiac Studies:  ECG:  Afib v pacing PVC   Telemetry:  07/08/2012  V pacing   Echo: 1/13 Study Conclusions  - Left ventricle: The cavity size was severely dilated. Wall thickness was normal. The estimated ejection fraction was 20%. Diffuse hypokinesis. - Mitral valve: Normal appearing mechanical MVR. Normal diastolic gradient and no peri valvular leak - Left atrium: The atrium was moderately dilated. - Right atrium: The atrium was mildly dilated. - Atrial septum: No defect or patent foramen ovale was identified.    Medications:     . carvedilol  12.5 mg Oral BID WC  . digoxin  0.0625 mg Oral Daily  . furosemide  40 mg Intravenous BID  . losartan  50 mg Oral Daily  . potassium chloride  10 mEq Oral Daily  . sodium chloride  3 mL Intravenous Q12H  . warfarin  6  mg Oral Custom   And  . warfarin  9 mg Oral Custom  . Warfarin - Pharmacist Dosing Inpatient   Does not apply q1800  . [DISCONTINUED] digoxin  0.0625 mg Oral Daily  . [DISCONTINUED] losartan  50 mg Oral Daily       Assessment/Plan:   CHF:  Mild increase lasix to tid today  No overt CHF on CXR and no edema Hopefully home in am Afib:  Good rate control pacer back up on coumadin MVR:  Normal valve function INR Rx Charlton Haws 07/08/2012, 8:16 AM

## 2012-07-08 NOTE — Progress Notes (Addendum)
ANTICOAGULATION CONSULT NOTE - Follow Up Consult  Pharmacy Consult for Coumadin Indication: atrial filbrillation and Bjork-Shiley MVR  No Known Allergies  Patient Measurements: Height: 5\' 7"  (170.2 cm) Weight: 142 lb 6.4 oz (64.592 kg) (scale b98.1) IBW/kg (Calculated) : 61.6   Vital Signs: Temp: 98.1 F (36.7 C) (11/13 0512) Temp src: Oral (11/13 0512) BP: 128/86 mmHg (11/13 0512) Pulse Rate: 73  (11/13 0512)  Labs:  Basename 07/08/12 0610 07/07/12 0514 07/07/12 0510 07/06/12 1238  HGB -- 15.3* 14.3 --  HCT -- 45.0 42.4 43.3  PLT -- -- 155 149.0*  APTT -- -- -- --  LABPROT 32.5* -- 28.1* 36.5*  INR 3.41* -- 2.80* 3.5*  HEPARINUNFRC -- -- -- --  CREATININE 0.92 0.90 -- 0.9  CKTOTAL -- -- -- --  CKMB -- -- -- --  TROPONINI -- -- -- --    Estimated Creatinine Clearance: 56.9 ml/min (by C-G formula based on Cr of 0.92).  Assessment: 65 year old female admitted for CHF exacerbation. She is on chronic anticoagulation with Coumadin for atrial fibrillation and Bjork-Shiley MVR. Her INR is 3.41 which is therapeutic with goal of 2.5-3.5. No bleeding noted. Spoke with patient about her coumadin and she did not seem like she knew her home dose. With one dose of 9mg  her inr jumped from 2.8 to 3.41. 6mg  per her home is ok to give today but it would seem that her INR would become SUPRA-therapeutic on 2 consecutive days of 9mg .   Goal of Therapy:  INR 2.5-3.5  Monitor platelets by anticoagulation protocol: Yes   Plan:  -Coumadin 6mg  today per home regimen -Will continue to follow INR to see INR trend when patient gets two days in a row of 9mg   Thank you,  Brett Fairy, PharmD 07/08/2012 8:52 AM

## 2012-07-08 NOTE — Progress Notes (Signed)
Pt had 5 beat run of V-tach, asymptomatic, BP=128/86, HR=73. MD notified, will continue to monitor.

## 2012-07-09 LAB — BASIC METABOLIC PANEL
BUN: 23 mg/dL (ref 6–23)
CO2: 26 mEq/L (ref 19–32)
Calcium: 9.2 mg/dL (ref 8.4–10.5)
Chloride: 96 mEq/L (ref 96–112)
Creatinine, Ser: 1.09 mg/dL (ref 0.50–1.10)
GFR calc Af Amer: 59 mL/min — ABNORMAL LOW (ref >90)
GFR calc non Af Amer: 51 mL/min — ABNORMAL LOW (ref >90)
Glucose, Bld: 108 mg/dL — ABNORMAL HIGH (ref 70–99)
Potassium: 3.4 mEq/L — ABNORMAL LOW (ref 3.5–5.1)
Sodium: 137 mEq/L (ref 135–145)

## 2012-07-09 LAB — GLUCOSE, CAPILLARY: Glucose-Capillary: 89 mg/dL (ref 70–99)

## 2012-07-09 LAB — PROTIME-INR
INR: 4.42 — ABNORMAL HIGH (ref 0.00–1.49)
Prothrombin Time: 39.4 seconds — ABNORMAL HIGH (ref 11.6–15.2)

## 2012-07-09 MED ORDER — SODIUM CHLORIDE 0.9 % IV BOLUS (SEPSIS)
250.0000 mL | Freq: Once | INTRAVENOUS | Status: AC
  Administered 2012-07-09: 250 mL via INTRAVENOUS

## 2012-07-09 MED ORDER — CARVEDILOL 6.25 MG PO TABS
6.2500 mg | ORAL_TABLET | Freq: Two times a day (BID) | ORAL | Status: DC
Start: 2012-07-09 — End: 2012-07-09
  Filled 2012-07-09 (×2): qty 1

## 2012-07-09 MED ORDER — CARVEDILOL 3.125 MG PO TABS
3.1250 mg | ORAL_TABLET | Freq: Two times a day (BID) | ORAL | Status: DC
  Filled 2012-07-09 (×8): qty 1

## 2012-07-09 MED ORDER — LOSARTAN POTASSIUM 25 MG PO TABS
25.0000 mg | ORAL_TABLET | Freq: Every day | ORAL | Status: DC
  Administered 2012-07-10: 25 mg via ORAL
  Filled 2012-07-09 (×3): qty 1

## 2012-07-09 MED ORDER — POTASSIUM CHLORIDE CRYS ER 20 MEQ PO TBCR
20.0000 meq | EXTENDED_RELEASE_TABLET | Freq: Every day | ORAL | Status: DC
  Administered 2012-07-10 – 2012-07-12 (×3): 20 meq via ORAL
  Filled 2012-07-09 (×3): qty 1

## 2012-07-09 MED ORDER — FUROSEMIDE 40 MG PO TABS
40.0000 mg | ORAL_TABLET | Freq: Two times a day (BID) | ORAL | Status: DC
  Filled 2012-07-09 (×8): qty 1

## 2012-07-09 NOTE — Progress Notes (Signed)
Notified MD of low B/P regarding medications due, modified orders and parameters given in reference to medications due and administrations. Patient continues to be asymptomatic and is resting in the bed. Will continue to monitor as needed to end of shift.

## 2012-07-09 NOTE — Progress Notes (Signed)
Notified MD about low bloodpressure. Orders given to monitor blood pressure in the next then once every hour to see of blood pressure decreases any more and to notify MD if patient is symptomatic and if Systolic bloodpressure is less than 10. Currently patient is watching TV in the bed and is asymptomatic. Will continue to monitor as needed to end of shift.

## 2012-07-09 NOTE — Progress Notes (Signed)
Pt had 10 beats of vtach on cardiac monitor.  Pt asymptomatic at the time.  Pt BP rechecked post NS bolus.  BP currently 99/64, pulse 82.  MD has been made aware.  Will continue to monitor. Nino Glow RN

## 2012-07-09 NOTE — Progress Notes (Signed)
ANTICOAGULATION CONSULT NOTE - Follow Up Consult  Pharmacy Consult for Coumadin Indication: atrial filbrillation and Bjork-Shiley MVR  No Known Allergies  Patient Measurements: Height: 5\' 7"  (170.2 cm) Weight: 137 lb 9.1 oz (62.4 kg) (scale B) IBW/kg (Calculated) : 61.6   Vital Signs: Temp: 98 F (36.7 C) (11/14 0446) Temp src: Oral (11/14 0446) BP: 67/53 mmHg (11/14 0923) Pulse Rate: 103  (11/14 0923)  Labs:  Basename 07/09/12 0600 07/08/12 0610 07/07/12 0514 07/07/12 0510 07/06/12 1238  HGB -- -- 15.3* 14.3 --  HCT -- -- 45.0 42.4 43.3  PLT -- -- -- 155 149.0*  APTT -- -- -- -- --  LABPROT 39.4* 32.5* -- 28.1* --  INR 4.42* 3.41* -- 2.80* --  HEPARINUNFRC -- -- -- -- --  CREATININE 1.09 0.92 0.90 -- --  CKTOTAL -- -- -- -- --  CKMB -- -- -- -- --  TROPONINI -- -- -- -- --    Estimated Creatinine Clearance: 48 ml/min (by C-G formula based on Cr of 1.09).  Assessment: 66 year old female admitted for CHF exacerbation. She is on chronic anticoagulation with Coumadin for atrial fibrillation and Bjork-Shiley MVR. Her INR is 4.42 today which is supratherapeutic with goal of 2.5-3.5. No bleeding noted. Patient  does not seem to know her home dose.  Goal of Therapy:  INR 2.5-3.5  Monitor platelets by anticoagulation protocol: Yes   Plan:  -No coumadin today -Cont daily INR. May need lower dose of coumadin at home  Thank you,  Talbert Cage, PharmD 07/09/2012 9:51 AM

## 2012-07-09 NOTE — Progress Notes (Signed)
Pt BP 67/53 with pulse of 103.  Pt denies feeling lightheaded or dizzy at this time.  Pt does c/o "not feeling like herself at times".  But is unable to verbalizes what that means.  MD has been made aware.  New orders given.  Will continue to monitor. Nino Glow RN

## 2012-07-09 NOTE — Progress Notes (Signed)
  Patient ID: Angela [name], female   DOB: February 17, 1946, 66 y.o.   MRN: [id number]    Subjective:  Denies SSCP, palpitations or Dyspnea   Objective:  Filed Vitals:   07/08/12 1749 07/08/12 2125 07/09/12 0045 07/09/12 0446  BP: 100/50  113/79 100/64  Pulse: 68 72 69 68  Temp:  97.6 F (36.4 C)  98 F (36.7 C)  TempSrc:  Oral  Oral  Resp:  18  16  Height:      Weight:    137 lb 9.1 oz (62.4 kg)  SpO2:  99%  99%    Intake/Output from previous day:  Intake/Output Summary (Last 24 hours) at 07/09/12 0815 Last data filed at 07/09/12 0804  Gross per 24 hour  Intake   1090 ml  Output    903 ml  Net    187 ml    Physical Exam: Affect appropriate Thin black female HEENT: normal Neck supple with no adenopathy JVP normal no bruits no thyromegaly Lungs clear with no wheezing and good diaphragmatic motion Heart:  S1 click mechanical valve /S2 no murmur, no rub, gallop or click PMI enlarged Abdomen: benighn, BS positve, no tenderness, no AAA no bruit.  No HSM or HJR Distal pulses intact with no bruits No edema Neuro non-focal Skin warm and dry No muscular weakness   Lab Results: Basic Metabolic Panel:  Basename 07/09/12 0600 07/08/12 0610  NA 137 141  K 3.4* 4.4  CL 96 103  CO2 26 21  GLUCOSE 108* 87  BUN 23 19  CREATININE 1.09 0.92  CALCIUM 9.2 9.5  MG -- 1.9  PHOS -- --    CBC:  Basename 07/07/12 0514 07/07/12 0510 07/06/12 1238  WBC -- 3.6* 4.1*  NEUTROABS -- -- 2.8  HGB 15.3* 14.3 --  HCT 45.0 42.4 --  MCV -- 88.3 90.1  PLT -- 155 149.0*    Imaging: No results found.  Cardiac Studies:  ECG:  Afib v pacing PVC   Telemetry:  07/09/2012  V pacing   Echo: 1/13 Study Conclusions  - Left ventricle: The cavity size was severely dilated. Wall thickness was normal. The estimated ejection fraction was 20%. Diffuse hypokinesis. - Mitral valve: Normal appearing mechanical MVR. Normal diastolic gradient and no peri valvular leak - Left atrium: The  atrium was moderately dilated. - Right atrium: The atrium was mildly dilated. - Atrial septum: No defect or patent foramen ovale was identified.    Medications:      . carvedilol  12.5 mg Oral BID WC  . digoxin  0.0625 mg Oral Daily  . furosemide  60 mg Intravenous BID  . losartan  50 mg Oral Daily  . potassium chloride  10 mEq Oral Daily  . sodium chloride  3 mL Intravenous Q12H  . warfarin  6 mg Oral Custom   And  . warfarin  9 mg Oral Custom  . Warfarin - Pharmacist Dosing Inpatient   Does not apply q1800  . [DISCONTINUED] furosemide  40 mg Intravenous BID  . [DISCONTINUED] potassium chloride  10 mEq Oral Daily  . [DISCONTINUED] potassium chloride  30 mEq Oral Daily       Assessment/Plan:   CHF: Improved  D/C with lasix 40 bid and KCL 20 mEq Afib:  Good rate control pacer back up on coumadin MVR:  Normal valve function INR Rx  F/U PA in 2 weeks  Consider CHF clinic to minimize rehospitalizations Charlton Haws 07/09/2012, 8:15 AM

## 2012-07-09 NOTE — Progress Notes (Signed)
Dr Antoine Poche notified of 11 beats of V-Tachs tonight  @ 2027. Pt appears to be asymptomatic. To continue to moniter  pt. Also informed of hypotention. To continue to moniter.

## 2012-07-10 LAB — BASIC METABOLIC PANEL
BUN: 18 mg/dL (ref 6–23)
CO2: 24 mEq/L (ref 19–32)
Calcium: 9.5 mg/dL (ref 8.4–10.5)
Chloride: 96 mEq/L (ref 96–112)
Creatinine, Ser: 0.91 mg/dL (ref 0.50–1.10)
GFR calc Af Amer: 73 mL/min — ABNORMAL LOW (ref >90)
GFR calc non Af Amer: 63 mL/min — ABNORMAL LOW (ref >90)
Glucose, Bld: 89 mg/dL (ref 70–99)
Potassium: 3.8 mEq/L (ref 3.5–5.1)
Sodium: 131 mEq/L — ABNORMAL LOW (ref 135–145)

## 2012-07-10 LAB — PROTIME-INR
INR: 3.78 — ABNORMAL HIGH (ref 0.00–1.49)
Prothrombin Time: 35.1 seconds — ABNORMAL HIGH (ref 11.6–15.2)

## 2012-07-10 MED ORDER — WARFARIN SODIUM 6 MG PO TABS
6.0000 mg | ORAL_TABLET | Freq: Once | ORAL | Status: AC
  Administered 2012-07-10: 6 mg via ORAL
  Filled 2012-07-10: qty 1

## 2012-07-10 NOTE — Progress Notes (Signed)
Patient ID: Angela [name], female   DOB: [birth year], 66 y.o.   MRN: [id number]    Subjective:  Denies SSCP, palpitations or Dyspnea  BP low yesterday and got fluid bolus and meds cut back   Objective:  Filed Vitals:   07/09/12 1800 07/09/12 1900 07/09/12 2118 07/10/12 0517  BP: 89/59 83/46 126/87 88/42  Pulse: 83 83  88  Temp:    98.3 F (36.8 C)  TempSrc:    Oral  Resp:    20  Height:      Weight:    138 lb 1.6 oz (62.642 kg)  SpO2:    99%    Intake/Output from previous day:  Intake/Output Summary (Last 24 hours) at 07/10/12 0831 Last data filed at 07/10/12 0802  Gross per 24 hour  Intake   1020 ml  Output    400 ml  Net    620 ml    Physical Exam: Affect appropriate Thin black female HEENT: normal Neck supple with no adenopathy JVP normal no bruits no thyromegaly Lungs clear with no wheezing and good diaphragmatic motion Heart:  S1 click mechanical valve /S2 no murmur, no rub, gallop or click PMI enlarged Abdomen: benighn, BS positve, no tenderness, no AAA no bruit.  No HSM or HJR Distal pulses intact with no bruits No edema Neuro non-focal Skin warm and dry No muscular weakness   Lab Results: Basic Metabolic Panel:  Basename 07/10/12 0546 07/09/12 0600 07/08/12 0610  NA 131* 137 --  K 3.8 3.4* --  CL 96 96 --  CO2 24 26 --  GLUCOSE 89 108* --  BUN 18 23 --  CREATININE 0.91 1.09 --  CALCIUM 9.5 9.2 --  MG -- -- 1.9  PHOS -- -- --    CBC: No results found for this basename: WBC:2,NEUTROABS:2,HGB:2,HCT:2,MCV:2,PLT:2 in the last 72 hours  Imaging: No results found.  Cardiac Studies:  ECG:  Afib v pacing PVC   Telemetry:  07/10/2012  V pacing   Echo: 1/13 Study Conclusions  - Left ventricle: The cavity size was severely dilated. Wall thickness was normal. The estimated ejection fraction was 20%. Diffuse hypokinesis. - Mitral valve: Normal appearing mechanical MVR. Normal diastolic gradient and no peri valvular leak - Left atrium:  The atrium was moderately dilated. - Right atrium: The atrium was mildly dilated. - Atrial septum: No defect or patent foramen ovale was identified.    Medications:      . carvedilol  3.125 mg Oral BID WC  . digoxin  0.0625 mg Oral Daily  . furosemide  40 mg Oral BID  . losartan  25 mg Oral Daily  . potassium chloride  20 mEq Oral Daily  . [COMPLETED] sodium chloride  250 mL Intravenous Once  . sodium chloride  3 mL Intravenous Q12H  . Warfarin - Pharmacist Dosing Inpatient   Does not apply q1800  . [DISCONTINUED] carvedilol  12.5 mg Oral BID WC  . [DISCONTINUED] carvedilol  6.25 mg Oral BID WC  . [DISCONTINUED] furosemide  60 mg Intravenous BID  . [DISCONTINUED] losartan  50 mg Oral Daily  . [DISCONTINUED] potassium chloride  10 mEq Oral Daily  . [DISCONTINUED] warfarin  6 mg Oral Custom  . [DISCONTINUED] warfarin  9 mg Oral Custom       Assessment/Plan:   CHF: Improved  losartin and coreg cut back 50%.  Lasix changed to 40 bid orally  D/C in am if BP systolic over 90 mmHg Afib:  Good rate  control pacer back up on coumadin MVR:  Normal valve function INR Rx  F/U PA in 2 weeks  Consider CHF clinic to minimize rehospitalizations Charlton Haws 07/10/2012, 8:31 AM

## 2012-07-10 NOTE — Progress Notes (Signed)
Morning dose of Coreg held d/t parameters set by MD to hold for SBP <95.  Patient SBP 88-will hold as ordered and continue to monitor. Troy Sine

## 2012-07-10 NOTE — Progress Notes (Signed)
ANTICOAGULATION CONSULT NOTE - Follow Up Consult  Pharmacy Consult for Coumadin Indication: atrial filbrillation and Bjork-Shiley MVR  No Known Allergies  Patient Measurements: Height: 5\' 7"  (170.2 cm) Weight: 138 lb 1.6 oz (62.642 kg) (scale b) IBW/kg (Calculated) : 61.6   Vital Signs: Temp: 98.3 F (36.8 C) (11/15 0517) Temp src: Oral (11/15 0517) BP: 88/42 mmHg (11/15 0517) Pulse Rate: 65  (11/15 1237)  Labs:  Basename 07/10/12 0546 07/09/12 0600 07/08/12 0610  HGB -- -- --  HCT -- -- --  PLT -- -- --  APTT -- -- --  LABPROT 35.1* 39.4* 32.5*  INR 3.78* 4.42* 3.41*  HEPARINUNFRC -- -- --  CREATININE 0.91 1.09 0.92  CKTOTAL -- -- --  CKMB -- -- --  TROPONINI -- -- --    Estimated Creatinine Clearance: 57.5 ml/min (by C-G formula based on Cr of 0.91).  Assessment: INR = 3.75 today (goal 2.5-3.5) in this 66 year old female admitted for CHF exacerbation. She is on chronic anticoagulation with Coumadin for atrial fibrillation and Bjork-Shiley MVR. No bleeding noted. Coumadin dose held yesterday 11/14 due to INR 4.42.  Patient did not seem to know her home dose per pharmacist discussion with patient on 07/08/12. (takes 9mg  daily except 6 mg on every Mon and Wed per med reconciliation).  May need lower dose than this considering INR up to 4.42 after 9mg  dose.   Goal of Therapy:  INR 2.5-3.5  Monitor platelets by anticoagulation protocol: Yes   Plan:  Coumadin 6mg  today Continue daily INR.   Thank you,  Noah Delaine, RPh Clinical Pharmacist Pager: [phone] 07/10/2012 2:09 PM

## 2012-07-10 NOTE — Progress Notes (Signed)
Patient's BP continues to be on the soft side (SBP 80's). Patient remained alert and asymptomatic throughout the night.  MD previously notified of soft BP-will continue to monitor. Angela [name]

## 2012-07-10 NOTE — Progress Notes (Signed)
Co-sign for assessments, meds, and IV charting for Lindajo Royal RN 7a-3p.

## 2012-07-11 DIAGNOSIS — I428 Other cardiomyopathies: Secondary | ICD-10-CM

## 2012-07-11 DIAGNOSIS — I495 Sick sinus syndrome: Secondary | ICD-10-CM

## 2012-07-11 LAB — PROTIME-INR
INR: 3.58 — ABNORMAL HIGH (ref 0.00–1.49)
Prothrombin Time: 33.7 seconds — ABNORMAL HIGH (ref 11.6–15.2)

## 2012-07-11 MED ORDER — WARFARIN SODIUM 6 MG PO TABS
6.0000 mg | ORAL_TABLET | Freq: Once | ORAL | Status: AC
  Administered 2012-07-11: 6 mg via ORAL
  Filled 2012-07-11: qty 1

## 2012-07-11 NOTE — Progress Notes (Signed)
ANTICOAGULATION CONSULT NOTE - Follow Up Consult  Pharmacy Consult for Coumadin Indication: atrial filbrillation and Bjork-Shiley MVR  No Known Allergies  Patient Measurements: Height: 5\' 7"  (170.2 cm) Weight: 139 lb 9.6 oz (63.322 kg) (scale b) IBW/kg (Calculated) : 61.6   Vital Signs: Temp: 99.7 F (37.6 C) (11/16 0629) Temp src: Oral (11/16 0629) BP: 82/63 mmHg (11/16 0629) Pulse Rate: 74  (11/16 0629)  Labs:  Basename 07/11/12 0525 07/10/12 0546 07/09/12 0600  HGB -- -- --  HCT -- -- --  PLT -- -- --  APTT -- -- --  LABPROT 33.7* 35.1* 39.4*  INR 3.58* 3.78* 4.42*  HEPARINUNFRC -- -- --  CREATININE -- 0.91 1.09  CKTOTAL -- -- --  CKMB -- -- --  TROPONINI -- -- --    Estimated Creatinine Clearance: 57.5 ml/min (by C-G formula based on Cr of 0.91).  Assessment: INR = 3.58 today (goal 2.5-3.5) in this 66 year old female admitted for CHF exacerbation. She is on chronic anticoagulation with Coumadin for atrial fibrillation and Bjork-Shiley MVR. No bleeding noted. Coumadin dose held 11/14 due to INR 4.42.  Patient did not seem to know her home dose per pharmacist discussion with patient on 07/08/12. (takes 9mg  daily except 6 mg on every Mon and Wed per med reconciliation).  May need lower dose than this considering INR up to 4.42 after 9mg  dose.   Goal of Therapy:  INR 2.5-3.5  Monitor platelets by anticoagulation protocol: Yes   Plan:  1. Repeat Coumadin 6mg  today 2. Continue daily INR. Herby Abraham, Pharm.D. [phone] 07/11/2012 11:21 AM

## 2012-07-11 NOTE — Progress Notes (Signed)
    Patient ID: Angela [name], female   DOB: [birth year], 66 y.o.   MRN: [id number]    Subjective:  Denies SSCP, palpitations or dyspnea. Feels better. Losartan, coreg, and lasix all held since yesterday for low BP. Did receive losartan yesterday am.   Objective:  Filed Vitals:   07/10/12 2215 07/10/12 2218 07/10/12 2220 07/11/12 0629  BP: 88/43 86/44 87/43  82/63  Pulse: 58 56 53 74  Temp:    99.7 F (37.6 C)  TempSrc:    Oral  Resp:    16  Height:      Weight:    63.322 kg (139 lb 9.6 oz)  SpO2:    100%    Intake/Output from previous day:  Intake/Output Summary (Last 24 hours) at 07/11/12 1109 Last data filed at 07/11/12 1102  Gross per 24 hour  Intake   1203 ml  Output    775 ml  Net    428 ml    Physical Exam: Affect appropriate Thin black female HEENT: normal Neck supple with no adenopathy JVP normal no bruits no thyromegaly Lungs clear with no wheezing and good diaphragmatic motion Heart:  S1 click mechanical valve /S2 no murmur, no rub, gallop or click PMI enlarged Abdomen: benighn, BS positve, no tenderness, no AAA no bruit.  No HSM or HJR Distal pulses intact with no bruits No edema Neuro non-focal Skin warm and dry No muscular weakness   Lab Results: Basic Metabolic Panel:  Basename 07/10/12 0546 07/09/12 0600  NA 131* 137  K 3.8 3.4*  CL 96 96  CO2 24 26  GLUCOSE 89 108*  BUN 18 23  CREATININE 0.91 1.09  CALCIUM 9.5 9.2  MG -- --  PHOS -- --    CBC: No results found for this basename: WBC:2,NEUTROABS:2,HGB:2,HCT:2,MCV:2,PLT:2 in the last 72 hours  Imaging: No results found.  Cardiac Studies:  ECG:  Afib v pacing PVC   Telemetry:  07/11/2012  V pacing   Echo: 1/13 Study Conclusions  - Left ventricle: The cavity size was severely dilated. Wall thickness was normal. The estimated ejection fraction was 20%. Diffuse hypokinesis. - Mitral valve: Normal appearing mechanical MVR. Normal diastolic gradient and no peri valvular leak -  Left atrium: The atrium was moderately dilated. - Right atrium: The atrium was mildly dilated. - Atrial septum: No defect or patent foramen ovale was identified.    Medications:      . carvedilol  3.125 mg Oral BID WC  . digoxin  0.0625 mg Oral Daily  . furosemide  40 mg Oral BID  . losartan  25 mg Oral Daily  . potassium chloride  20 mEq Oral Daily  . sodium chloride  3 mL Intravenous Q12H  . [COMPLETED] warfarin  6 mg Oral ONCE-1800  . Warfarin - Pharmacist Dosing Inpatient   Does not apply q1800       Assessment/Plan:   CHF: Improved  But BP a limiting factor. Hasn't received any meds other than dig due to low BP. Denies any dizzyness. Will watch in hospital today. Maybe DC tomorrow if BP improves. Afib:  Good rate control pacer back up on coumadin MVR:  Normal valve function INR Rx  Consider CHF clinic to minimize rehospitalizations  Theron Arista [facility] 07/11/2012, 11:09 AM

## 2012-07-12 ENCOUNTER — Encounter (HOSPITAL_COMMUNITY): Payer: Self-pay | Admitting: Physician Assistant

## 2012-07-12 DIAGNOSIS — Z7901 Long term (current) use of anticoagulants: Secondary | ICD-10-CM

## 2012-07-12 DIAGNOSIS — I952 Hypotension due to drugs: Secondary | ICD-10-CM

## 2012-07-12 DIAGNOSIS — E876 Hypokalemia: Secondary | ICD-10-CM

## 2012-07-12 DIAGNOSIS — I472 Ventricular tachycardia: Secondary | ICD-10-CM

## 2012-07-12 DIAGNOSIS — Z952 Presence of prosthetic heart valve: Secondary | ICD-10-CM

## 2012-07-12 LAB — PROTIME-INR
INR: 3.8 — ABNORMAL HIGH (ref 0.00–1.49)
Prothrombin Time: 35.2 seconds — ABNORMAL HIGH (ref 11.6–15.2)

## 2012-07-12 MED ORDER — POTASSIUM CHLORIDE 10 MEQ PO TBCR: 20.0000 meq | EXTENDED_RELEASE_TABLET | Freq: Every day | ORAL | Status: DC

## 2012-07-12 MED ORDER — WARFARIN SODIUM 6 MG PO TABS: 6.0000 mg | ORAL_TABLET | Freq: Every day | ORAL | Status: DC

## 2012-07-12 MED ORDER — CARVEDILOL 3.125 MG PO TABS: 3.1250 mg | ORAL_TABLET | Freq: Two times a day (BID) | ORAL | Status: DC

## 2012-07-12 MED ORDER — FUROSEMIDE 40 MG PO TABS: 40.0000 mg | ORAL_TABLET | Freq: Two times a day (BID) | ORAL | Status: DC

## 2012-07-12 MED ORDER — WARFARIN SODIUM 3 MG PO TABS
3.0000 mg | ORAL_TABLET | Freq: Once | ORAL | Status: DC
  Filled 2012-07-12: qty 1

## 2012-07-12 NOTE — Discharge Summary (Signed)
Discharge Summary   Patient ID: Angela [name],  MRN: [id number], DOB/AGE: 68-Apr-1947 68 y.o.  Admit date: 07/07/2012 Discharge date: 07/12/2012   Primary Care Physician:  Lorenda Peck   Primary Cardiologist:  Dr. Lewayne Bunting  Primary Electrophysiologist:  Dr. Lewayne Bunting   Reason for Admission:  Acute on Chronic Systolic CHF  Primary Discharge Diagnoses:   1. Acute on Chronic Systolic CHF     - d/c weight 469 lbs (admx weight 147 lbs) 2. Nonsustained VTach - asymptomatic 3. Hypokalemia - corrected 4. Rheumatic Heart Disease, s/p Mechanical MV Replacement     - coumadin dose adjusted at d/c due to elevated INR 5. Atrial Fibrillation     - rate controlled this admission 6. Hypotension - improved at d/c 7. Non-Ischemic Cardiomyopathy     - CHF Medications adjusted due to low BP    Secondary Discharge Diagnoses:   Past Medical History  Diagnosis Date  . Nonischemic cardiomyopathy     a.  11/2001 - Cath - NL Cors;  b. 01/2008 Echo - EF 10-20%;  c. Echocardiogram 09/22/11: EF 20%, MVR normal, moderate LAE, mild RAE   . Atrial fibrillation     A.  Chronic Coumadin  . Diverticular disease   . History of colon cancer     A.  1999 - T3, N1  chemotherapy  . Internal hemorrhoids   . Rheumatic heart disease     A. 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 Sick sinus syndrome     A.  s/p pacer in 1995.  B.    . Cardiac arrest - ventricular fibrillation     A.  12/2007;  B. 01/2008 St. Jude Promote Bi-V ICD placed  . Systolic CHF, chronic     A.  01/2951 Echo - EF 10-20%  . Embolus and thrombosis of iliac artery     A.  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 CVA (cerebral vascular accident)   . DVT (deep venous thrombosis)   . Arthritis      Allergies:   No Known Allergies    Procedures Performed This Admission:  None    Hospital Course: Angela [name] is a 66 y.o. female with a hx of chronic systolic CHF in the setting of NICM, SSS, s/p pacemaker with upgrade to a  BiV-ICD after surviving VFib arrest in 2009, rheumatic heart disease, status post mitral valve replacement with a Bjork-Shiley (mechanical) prosthesis, chronic Coumadin therapy, parox AFib, s/p right femoral embolectomy in 2003. Of note, she had a LHC 4/03: Normal coronary arteries. She was admitted 08/2011 with a/c systolic CHF in the setting of AFib with RVR. Rate control was difficult and she underwent AV nodal ablation.   She was seen in the office on 07/06/12 with complaints of dyspnea. Labs were obtained. She then presented the next day to the emergency room with worsening dyspnea and orthopnea. She was volume overloaded. IV diuresis was initiated. She had good diuresis. She was approaching discharge, but developed significant hypotension. Her carvedilol and losartan doses were reduced. These were ultimately held for hypotension. Lasix was also held. With holding her drugs, her BPs improved. She was evaluated by Dr. [place] today and felt to be in improved condition and ready for discharge to home. She will be discharged on lower dose beta blocker. Her ARB will be held at discharge due to hypotension. It is recommended that she see the CHF clinic in followup to limit readmissions. She was noted to have nonsustained  ventricular tachycardia on telemetry. She was asymptomatic.  She was hypokalemic and this was corrected.  INR was supratherapeutic at discharge. One dose will be held and her maintenance dose was changed to 6 mg daily. Close followup in the Coumadin clinic will be obtained.    Discharge Vitals: Blood pressure 100/61, pulse 71, temperature 98.4 F (36.9 C), temperature source Oral, resp. rate 20, height 5\' 7"  (1.702 m), weight 139 lb 15.9 oz (63.5 kg), SpO2 96.00%.   Labs: Lab Results  Component Value Date   WBC 3.6* 07/07/2012   HGB 15.3* 07/07/2012   HCT 45.0 07/07/2012   MCV 88.3 07/07/2012   PLT 155 07/07/2012      Lab 07/10/12 0546  NA 131*  K 3.8  CL 96  CO2 24  BUN 18    CREATININE 0.91  CALCIUM 9.5  PROT --  BILITOT --  ALKPHOS --  ALT --  AST --  GLUCOSE 89    Lab Results  Component Value Date   TSH 1.158 09/21/2011     Diagnostic Procedures and Studies:  Dg Chest Portable 1 View  07/07/2012     IMPRESSION: Cardiomegaly.  Emphysema/chronic changes.  No active disease.   Original Report Authenticated By: Charlett Nose, M.D.     Disposition:   Pt is being discharged home today in good condition.  Follow-up Plans & Appointments      Follow-up Information    Follow up with [facility]. In 1 week. (Someone will call you to arrange follow up at the CHF clinic.  You will have labs drawn that day (BMET).)    Contact information:   [address] [id number] mc [place] [address] [phone]      Follow up with Lewayne Bunting, MD. (as planned)    Contact information:   [address] [place] [address] [phone]       Follow up with [facility]. In 5 days. (office will call to bring you in to check your coumadin)    Contact information:   [address], [address] [place] [address] [phone]         Discharge Medications    Medication List     As of 07/12/2012 12:13 PM    STOP taking these medications         losartan 25 MG tablet   Commonly known as: COZAAR      TAKE these medications         carvedilol 3.125 MG tablet   Commonly known as: COREG   Take 1 tablet (3.125 mg total) by mouth 2 (two) times daily with a meal.      digoxin 0.125 MG tablet   Commonly known as: LANOXIN   Take 1/2 tab (0.0625mg ) daily      furosemide 40 MG tablet   Commonly known as: LASIX   Take 1 tablet (40 mg total) by mouth 2 (two) times daily.      potassium chloride 10 MEQ CR tablet   Commonly known as: KLOR-CON   Take 2 tablets (20 mEq total) by mouth daily.      warfarin 6 MG tablet   Commonly known  as: COUMADIN   Take 1 tablet (6 mg total) by mouth daily.          Outstanding Labs/Studies 1.  BMET in 1 week 2.  Coumadin clinic visit 1 week  Duration of Discharge Encounter:  Greater than 30 minutes including physician and PA time.  Signed, Tereso Newcomer, PA-C  12:13 PM 07/12/2012      [address], [address] [place], [place]  [address] Phone: [phone] Fax:  [phone]

## 2012-07-12 NOTE — Discharge Summary (Signed)
Patient seen and examined and history reviewed. Agree with above findings and plan. See rounding note earlier today.  Peter JordanMD,FACC 07/12/2012 1:40 PM    

## 2012-07-12 NOTE — Progress Notes (Addendum)
    Patient ID: Angela [name], female   DOB: [birth year], 66 y.o.   MRN: [id number]    Subjective:  Denies SSCP, palpitations or dyspnea. Feels better. Ambulating without significant dizzyness.   Objective:  Filed Vitals:   07/11/12 1422 07/11/12 2125 07/12/12 0658 07/12/12 0850  BP: 78/47 80/48 91/68  100/61  Pulse: 66 74 71 71  Temp: 97.1 F (36.2 C) 98.1 F (36.7 C) 98.4 F (36.9 C)   TempSrc: Oral Oral Oral   Resp: 17 18 18 20   Height:      Weight:   63.5 kg (139 lb 15.9 oz)   SpO2: 100% 100% 99% 96%    Intake/Output from previous day:  Intake/Output Summary (Last 24 hours) at 07/12/12 1107 Last data filed at 07/12/12 0851  Gross per 24 hour  Intake    760 ml  Output    825 ml  Net    -65 ml    Physical Exam: Affect appropriate Thin black female HEENT: normal Neck supple with no adenopathy JVP normal no bruits no thyromegaly Lungs clear with no wheezing and good diaphragmatic motion Heart:  S1 click mechanical valve /S2 no murmur, no rub, gallop or click PMI enlarged Abdomen: benighn, BS positve, no tenderness, no AAA no bruit.  No HSM or HJR Distal pulses intact with no bruits No edema Neuro non-focal Skin warm and dry No muscular weakness   Lab Results: Basic Metabolic Panel:  Basename 07/10/12 0546  NA 131*  K 3.8  CL 96  CO2 24  GLUCOSE 89  BUN 18  CREATININE 0.91  CALCIUM 9.5  MG --  PHOS --    CBC: No results found for this basename: WBC:2,NEUTROABS:2,HGB:2,HCT:2,MCV:2,PLT:2 in the last 72 hours  Imaging: No results found.  Cardiac Studies:  ECG:  Afib v pacing PVC   Telemetry:  07/12/2012  V pacing, one 9 beat run of NSVT  Echo: 1/13 Study Conclusions  - Left ventricle: The cavity size was severely dilated. Wall thickness was normal. The estimated ejection fraction was 20%. Diffuse hypokinesis. - Mitral valve: Normal appearing mechanical MVR. Normal diastolic gradient and no peri valvular leak - Left atrium: The atrium was  moderately dilated. - Right atrium: The atrium was mildly dilated. - Atrial septum: No defect or patent foramen ovale was identified.    Medications:      . carvedilol  3.125 mg Oral BID WC  . digoxin  0.0625 mg Oral Daily  . furosemide  40 mg Oral BID  . losartan  25 mg Oral Daily  . potassium chloride  20 mEq Oral Daily  . sodium chloride  3 mL Intravenous Q12H  . warfarin  3 mg Oral ONCE-1800  . [COMPLETED] warfarin  6 mg Oral ONCE-1800  . Warfarin - Pharmacist Dosing Inpatient   Does not apply q1800       Assessment/Plan:   CHF: Improved  But BP a limiting factor. Improved today. Will continue with carvedilol low dose 3.125 mg bid. Hold losartan. Continue lasix 40 mg bid po. Would recommend discharge today. I think she would benefit from referral to CHF clinic. NSVT: asymptomatic Afib:  Good rate control pacer  on coumadin. INR 3.8 today. Target 2.5-3.5. Will hold today. Reduce dose to 6 mg daily at home. Needs close follow up in coumadin clinic. MVR:  Normal valve function     Theron [facility]ista [facility] 07/12/2012, 11:07 AM

## 2012-07-12 NOTE — Progress Notes (Signed)
ANTICOAGULATION CONSULT NOTE - Follow Up Consult  Pharmacy Consult for Coumadin Indication: atrial filbrillation and Bjork-Shiley MVR  No Known Allergies  Patient Measurements: Height: 5\' 7"  (170.2 cm) Weight: 139 lb 15.9 oz (63.5 kg) IBW/kg (Calculated) : 61.6   Vital Signs: Temp: 98.4 F (36.9 C) (11/17 0658) Temp src: Oral (11/17 0658) BP: 100/61 mmHg (11/17 0850) Pulse Rate: 71  (11/17 0850)  Labs:  Basename 07/12/12 0500 07/11/12 0525 07/10/12 0546  HGB -- -- --  HCT -- -- --  PLT -- -- --  APTT -- -- --  LABPROT 35.2* 33.7* 35.1*  INR 3.80* 3.58* 3.78*  HEPARINUNFRC -- -- --  CREATININE -- -- 0.91  CKTOTAL -- -- --  CKMB -- -- --  TROPONINI -- -- --    Estimated Creatinine Clearance: 57.5 ml/min (by C-G formula based on Cr of 0.91).  Assessment: INR = 3.58 today (goal 2.5-3.5) in this 66 year old female admitted for CHF exacerbation. She is on chronic anticoagulation with Coumadin for atrial fibrillation and Bjork-Shiley MVR. No bleeding noted. Coumadin dose held 11/14 due to INR 4.42.  HOME Dose: Per [facility] - takes 9mg  daily except 6 mg on every Mon and Wed  INR today is 3.8 which is slightly above the desired goal range of 2.5-3.5 and she has only been receiving 6mg  daily.  Not sure why she continues to trend up.  Goal of Therapy:  INR 2.5-3.5  Monitor platelets by anticoagulation protocol: Yes   Plan:  1. Give Coumadin 3 mg today 2. Continue daily INR.  Nadara Mustard, PharmD., MS Clinical Pharmacist Pager:  [phone] Thank you for allowing pharmacy to be part of this patients care team. 07/12/2012 11:02 AM

## 2012-07-14 ENCOUNTER — Telehealth (HOSPITAL_COMMUNITY): Payer: Self-pay | Admitting: Cardiology

## 2012-07-14 NOTE — Telephone Encounter (Signed)
No answer unable to leave message.  Attempting to contact pt for follow up with CHF clinic per LB, Scott

## 2012-07-16 ENCOUNTER — Encounter (HOSPITAL_COMMUNITY): Payer: Self-pay

## 2012-07-16 ENCOUNTER — Ambulatory Visit (HOSPITAL_COMMUNITY)
Admission: RE | Admit: 2012-07-16 | Discharge: 2012-07-16 | Disposition: A | Discharge status: Home / Self Care | Payer: No Typology Code available for payment source | Source: Ambulatory Visit | Attending: Internal Medicine | Admitting: Internal Medicine

## 2012-07-16 VITALS — BP 98/62 | HR 64 | Wt 139.0 lb

## 2012-07-16 DIAGNOSIS — I5022 Chronic systolic (congestive) heart failure: Secondary | ICD-10-CM | POA: Insufficient documentation

## 2012-07-16 DIAGNOSIS — I509 Heart failure, unspecified: Secondary | ICD-10-CM

## 2012-07-16 LAB — PROTIME-INR
INR: 2.52 — ABNORMAL HIGH (ref 0.00–1.49)
Prothrombin Time: 26 seconds — ABNORMAL HIGH (ref 11.6–15.2)

## 2012-07-16 LAB — BASIC METABOLIC PANEL
BUN: 29 mg/dL — ABNORMAL HIGH (ref 6–23)
CO2: 25 mEq/L (ref 19–32)
Calcium: 9.5 mg/dL (ref 8.4–10.5)
Chloride: 96 mEq/L (ref 96–112)
Creatinine, Ser: 1.01 mg/dL (ref 0.50–1.10)
GFR calc Af Amer: 65 mL/min — ABNORMAL LOW (ref >90)
GFR calc non Af Amer: 56 mL/min — ABNORMAL LOW (ref >90)
Glucose, Bld: 101 mg/dL — ABNORMAL HIGH (ref 70–99)
Potassium: 4.2 mEq/L (ref 3.5–5.1)
Sodium: 134 mEq/L — ABNORMAL LOW (ref 135–145)

## 2012-07-16 NOTE — Assessment & Plan Note (Addendum)
Have explained the role of the HF clinic to the patient and her husband.  Volume status looks good.  She is weighing daily but not recording, she will start to record weights.  Have also discussed sliding scale lasix, they voiced understanding.  Appears her heart failure may be worse with inability to tolerate ACE-I or titration of carvedilol.  Will continue low dose carvedilol and digoxin, no ACE-I with soft BP and recent hypotension.  Will follow closely in clinic as she may require RHC to assess output.  With comorbidities and frailty she does not appear to be candidate for advance therapies but may require home inotropes in the future.  Follow up 2 weeks with 6 minute walk.    Patient seen and examined with Ulyess Blossom, PA-C. We discussed all aspects of the encounter. I agree with the assessment and plan as stated above.  She has advanced HF symptoms with intolerance of ACE-I. Extensive HF education provided with focus on volume management.. Unable to perform CPX so will start with . If does poorly may need RHC to assess for need for inotropes. Not candidate for VAD or Tx.

## 2012-07-16 NOTE — Progress Notes (Signed)
Utilization Review Completed.   Kimberly Tucker, RN, BSN Nurse Case Manager  [phone]  

## 2012-07-16 NOTE — Progress Notes (Addendum)
Referring Physician: Dr. Lewayne Bunting Primary Care: Dr. Burton Apley Primary Cardiologist: Dr. Lewayne Bunting  Weight Range: Baseline proBNP:  HPI: Angela [name] is a 66 y.o. female with a hx of chronic systolic CHF in the setting of NICM, EF 20% (08/2011), SSS, s/p pacemaker with upgrade to a St Jude BiV-ICD after surviving VFib arrest in 2009, rheumatic heart disease, status post mitral valve replacement with a Bjork-Shiley (mechanical) prosthesis, chronic Coumadin therapy, parox AFib, s/p right femoral embolectomy in 2003. Of note, she had a LHC 4/03: Normal coronary arteries. She was admitted 08/2011 with a/c systolic CHF in the setting of AFib with RVR. Rate control was difficult and she underwent AV nodal ablation.   Admitted to [facility]/12-11/17 with A/C systolic HF.  She was placed on IV lasix with good diuresis.  Stay c/b hypotension therefore carvedilol decreased and losartan stopped.  Discharge weight 139 pounds.   She presents for post hospital follow up today with her husband.  She feels well today.  Her weight is stable at home, 130 pounds.  She denies orthopnea/PND or edema.  Her husband does most of the cooking, they are unsure the amount of sodium they should take in.  She denies dizziness/syncope.  No chest pain.  She is compliant with meds.  She feels weak but this is improving slowly.  She feels a little unsteady on her feet.     Review of Systems: [y] = yes, [ ]  = no   General: Weight gain [ ] ; Weight loss [ ] ; Anorexia [ ] ; Fatigue [ ] ; Fever [ ] ; Chills [ ] ; Weakness [ ]   Cardiac: Chest pain/pressure [ ] ; Resting SOB [ ] ; Exertional SOB [ ] ; Orthopnea [ ] ; Pedal Edema [ ] ; Palpitations [ ] ; Syncope [ ] ; Presyncope [ ] ; Paroxysmal nocturnal dyspnea[ ]   Pulmonary: Cough [ ] ; Wheezing[ ] ; Hemoptysis[ ] ; Sputum [ ] ; Snoring [ ]   GI: Vomiting[ ] ; Dysphagia[ ] ; Melena[ ] ; Hematochezia [ ] ; Heartburn[ ] ; Abdominal pain [ ] ; Constipation [ ] ; Diarrhea [ ] ; BRBPR [ ]   GU: Hematuria[ ] ;  Dysuria [ ] ; Nocturia[ ]   Vascular: Pain in legs with walking [ ] ; Pain in feet with lying flat [ ] ; Non-healing sores [ ] ; Stroke [ ] ; TIA [ ] ; Slurred speech [ ] ;  Neuro: Headaches[ ] ; Vertigo[ ] ; Seizures[ ] ; Paresthesias[ ] ;Blurred vision [ ] ; Diplopia [ ] ; Vision changes [ ]   Ortho/Skin: Arthritis [ ] ; Joint pain [ ] ; Muscle pain [ ] ; Joint swelling [ ] ; Back Pain [ ] ; Rash [ ]   Psych: Depression[ ] ; Anxiety[ ]   Heme: Bleeding problems [ ] ; Clotting disorders [ ] ; Anemia [ ]   Endocrine: Diabetes [ ] ; Thyroid dysfunction[ ]    Past Medical History  Diagnosis Date  . Nonischemic cardiomyopathy     a.  11/2001 - Cath - NL Cors;  b. 01/2008 Echo - EF 10-20%;  c. Echocardiogram 09/22/11: EF 20%, MVR normal, moderate LAE, mild RAE   . Atrial fibrillation     A.  Chronic Coumadin  . Diverticular disease   . History of colon cancer     A.  1999 - T3, N1  chemotherapy  . Internal hemorrhoids   . Rheumatic heart disease     A. 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 Sick sinus syndrome     A.  s/p pacer in 1995.  B.    . Cardiac arrest - ventricular fibrillation     A.  12/2007;  B. 01/2008  St. Jude Promote Bi-V ICD placed  . Systolic CHF, chronic     A.  08/6107 Echo - EF 10-20%  . Embolus and thrombosis of iliac artery     A.  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 CVA (cerebral vascular accident)   . DVT (deep venous thrombosis)   . Arthritis     Current Outpatient Prescriptions  Medication Sig Dispense Refill  . carvedilol (COREG) 3.125 MG tablet Take 1 tablet (3.125 mg total) by mouth 2 (two) times daily with a meal.  60 tablet  5  . digoxin (LANOXIN) 0.125 MG tablet Take 1/2 tab (0.0625mg ) daily      . furosemide (LASIX) 40 MG tablet Take 1 tablet (40 mg total) by mouth 2 (two) times daily.  60 tablet  5  . potassium chloride (KLOR-CON) 10 MEQ CR tablet Take 2 tablets (20 mEq total) by mouth daily.  60 tablet  5  . warfarin (COUMADIN) 6 MG tablet Take 1 tablet (6 mg total) by  mouth daily.  30 tablet  1    No Known Allergies  History   Social History  . Marital Status: Married    Spouse Name: N/A    Number of Children: 1  . Years of Education: N/A   Occupational History  . Retired    Social History Main Topics  . Smoking status: Never Smoker   . Smokeless tobacco: Never Used  . Alcohol Use: No  . Drug Use: No  . Sexually Active: No   Other Topics Concern  . Not on file   Social History Narrative   The patient is married and has 1 child.  Lives in [place] with husband.  She is retired from working at [employer].  She does not smoke or drink    Family History  Problem Relation Age of Onset  . Diabetes Mother 86  . Cancer Father 22    Lung Cancer and smoker  . Coronary artery disease Mother     s/p cabg  . Coronary artery disease Sister     s/p cabg    PHYSICAL EXAM: Filed Vitals:   07/16/12 1029  BP: 98/62  Pulse: 64  Weight: 139 lb (63.05 kg)  SpO2: 98%    General:  Frail. appearing. No respiratory difficulty HEENT: normal Neck: supple. JVP 6-7. Carotids 2+ bilat; no bruits. No lymphadenopathy or thryomegaly appreciated. Cor: PMI nondisplaced. Mechanical S1.  Regular rate & rhythm. No rubs, gallops or murmurs. Lungs: clear Abdomen: soft, nontender, nondistended. No hepatosplenomegaly. No bruits or masses. Good bowel sounds. Extremities: no cyanosis, clubbing, rash, edema Neuro: alert & oriented x 3, cranial nerves grossly intact. moves all 4 extremities w/o difficulty. Affect pleasant.     ASSESSMENT & PLAN:

## 2012-07-16 NOTE — Patient Instructions (Addendum)
Follow up 2 weeks with 6 minute walk.  Labs today.  Do the following things EVERYDAY: 1) Weigh yourself in the morning before breakfast. Write it down and keep it in a log. 2) Take your medicines as prescribed 3) Eat low salt foods-Limit salt (sodium) to 2000 mg per day.  4) Stay as active as you can everyday 5) Limit all fluids for the day to less than 2 liters

## 2012-07-31 ENCOUNTER — Ambulatory Visit (HOSPITAL_COMMUNITY)
Admission: RE | Admit: 2012-07-31 | Discharge: 2012-07-31 | Disposition: A | Discharge status: Home / Self Care | Payer: No Typology Code available for payment source | Source: Ambulatory Visit | Attending: Internal Medicine | Admitting: Internal Medicine

## 2012-07-31 VITALS — BP 100/64 | HR 80 | Wt 140.5 lb

## 2012-07-31 DIAGNOSIS — I5022 Chronic systolic (congestive) heart failure: Secondary | ICD-10-CM | POA: Insufficient documentation

## 2012-07-31 DIAGNOSIS — I509 Heart failure, unspecified: Secondary | ICD-10-CM | POA: Insufficient documentation

## 2012-07-31 NOTE — Progress Notes (Addendum)
6 min walk test complete, pt had a hard time due to her abnormal gait, but she did ambulate 800 ft.  She started out at 99% on RA at rest, but O2 sat dropped to 84%, on RA while ambulating, after 100 ft and pt was placed on 2L O2 via nasal canula and O2 sat improved to 95% with ambulation, pt continued to sat around 90% the remainder of the test.

## 2012-07-31 NOTE — Patient Instructions (Addendum)
Take extra lasix (2 tabs in am and 1 tab in pm) for weight 138 pounds or more.  Will refer you for home O2 to be used during ambulation.    Follow up 3-4 weeks.

## 2012-07-31 NOTE — Progress Notes (Signed)
Referring Physician: Dr. Lewayne Bunting Primary Care: Dr. Burton Apley Primary Cardiologist: Dr. Lewayne Bunting  Weight Range: Baseline proBNP:  HPI: Angela [name] is a 66 y.o. female with a hx of chronic systolic CHF in the setting of NICM, EF 20% (08/2011), SSS, s/p pacemaker with upgrade to a St Jude BiV-ICD after surviving VFib arrest in 2009, rheumatic heart disease, status post mitral valve replacement with a Bjork-Shiley (mechanical) prosthesis, chronic Coumadin therapy, parox AFib, s/p right femoral embolectomy in 2003. Of note, she had a LHC 4/03: Normal coronary arteries. She was admitted 08/2011 with a/c systolic CHF in the setting of AFib with RVR. Rate control was difficult and she underwent AV nodal ablation.   Admitted to [facility] 11/12-11/17 with A/C systolic HF.  She was placed on IV lasix with good diuresis.  Stay c/b hypotension therefore carvedilol decreased and losartan stopped.  Discharge weight 139 pounds.   07/31/12:  Today 243 m and O2 dropped 84% improved to 95% on 2L  She returns for follow up today with her husband.  She feels fine.  Says her weakness is improving.  But continues to be unsteady on her feet.  She has started recording her weight and it typically ranges 134-135 but has increased to 138 pounds over the last couple of days.  She denies dyspnea but states she gets tired with chores.  No bleeding.  She denies edema, orthopnea, or PND.  No dizziness/syncope.  Review of Systems: All pertinent positives as in HPI, otherwise negative.   Past Medical History  Diagnosis Date  . Nonischemic cardiomyopathy     a.  11/2001 - Cath - NL Cors;  b. 01/2008 Echo - EF 10-20%;  c. Echocardiogram 09/22/11: EF 20%, MVR normal, moderate LAE, mild RAE   . Atrial fibrillation     A.  Chronic Coumadin  . Diverticular disease   . History of colon cancer     A.  1999 - T3, N1  chemotherapy  . Internal hemorrhoids   . Rheumatic heart disease     A. 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  Sick sinus syndrome     A.  s/p pacer in 1995.  B.    . Cardiac arrest - ventricular fibrillation     A.  12/2007;  B. 01/2008 St. Jude Promote Bi-V ICD placed  . Systolic CHF, chronic     A.  08/6107 Echo - EF 10-20%  . Embolus and thrombosis of iliac artery     A.  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 CVA (cerebral vascular accident)   . DVT (deep venous thrombosis)   . Arthritis     Current Outpatient Prescriptions  Medication Sig Dispense Refill  . carvedilol (COREG) 3.125 MG tablet Take 1 tablet (3.125 mg total) by mouth 2 (two) times daily with a meal.  60 tablet  5  . digoxin (LANOXIN) 0.125 MG tablet Take 1/2 tab (0.0625mg ) daily      . furosemide (LASIX) 40 MG tablet Take 1 tablet (40 mg total) by mouth 2 (two) times daily.  60 tablet  5  . potassium chloride (KLOR-CON) 10 MEQ CR tablet Take 2 tablets (20 mEq total) by mouth daily.  60 tablet  5  . warfarin (COUMADIN) 6 MG tablet Take 1 tablet (6 mg total) by mouth daily.  30 tablet  1    No Known Allergies  PHYSICAL EXAM: Filed Vitals:   07/31/12 0908  BP: 100/64  Pulse: 80  Weight: 140 lb 8 oz (63.73 kg)  SpO2: 100%    General:  Frail. appearing. No respiratory difficulty HEENT: normal Neck: supple. JVP 8-9. Carotids 2+ bilat; no bruits. No lymphadenopathy or thryomegaly appreciated. Cor: PMI nondisplaced. Mechanical S1.  Regular rate & rhythm. No rubs, gallops or murmurs. Lungs: clear Abdomen: soft, nontender, nondistended. No hepatosplenomegaly. No bruits or masses. Good bowel sounds. Extremities: no cyanosis, clubbing, rash, edema Neuro: alert & oriented x 3, cranial nerves grossly intact. moves all 4 extremities w/o difficulty. Affect pleasant.     ASSESSMENT & PLAN:

## 2012-07-31 NOTE — Assessment & Plan Note (Addendum)
Mrs. Lawhead presents with advanced HF symptoms.  I believe her symptoms are worse than she lets on too as she is fatigued after chores, ~200 M and O2 sats dropped to 84%.  She has also had difficult time tolerating increased dosages of carvedilol and intolerable to ACE-I due to hypotension.  Will continue carvedilol and digoxin at this time.  She will most likely need RHC in the near future to assess hemodynamics/output.  She is not a candidate for VAD or Tx but home inotropes may be an option.  Had long discussion with patient and husband concerning prognosis and need to discuss code status.   Patient seen and examined with Ulyess Blossom, PA-C. We discussed all aspects of the encounter. I agree with the assessment and plan as stated above.  Although she reports only mild limitations, clinically she appears to be very debilitated with advanced HF signs and intolerance of HF meds. confirms this and also shows need for supplemental O2. We had a long discussion about her prognosis and need for an Advanced Directive. They have not decided upon this yet. I think the best option may be consideration of Hospice but I would also be will ing to consider RHC with option for palliative inotropes if she were to feel more symptomatic. VAD and Tx are not options.  Will continue to follow her closely. Based on performance doubt she would be able to complete CPX adequately.

## 2012-08-04 NOTE — Addendum Note (Signed)
Encounter addended by: Noralee Space, RN on: 08/04/2012  2:39 PM<BR>     Documentation filed: Notes Section

## 2012-08-05 NOTE — Addendum Note (Signed)
Encounter addended by: Theresia Bough, CMA on: 08/05/2012 10:39 AM<BR>     Documentation filed: Patient Instructions Section, Visit Diagnoses, Orders

## 2012-08-05 NOTE — Addendum Note (Signed)
Encounter addended by: Noralee Space, RN on: 08/05/2012  8:13 AM<BR>     Documentation filed: Orders

## 2012-08-24 ENCOUNTER — Ambulatory Visit (HOSPITAL_COMMUNITY)
Admission: RE | Admit: 2012-08-24 | Discharge: 2012-08-24 | Disposition: A | Discharge status: Home / Self Care | Payer: No Typology Code available for payment source | Source: Ambulatory Visit | Attending: Internal Medicine | Admitting: Internal Medicine

## 2012-08-24 ENCOUNTER — Encounter (HOSPITAL_COMMUNITY): Payer: Self-pay

## 2012-08-24 VITALS — BP 98/67 | HR 77 | Wt 141.8 lb

## 2012-08-24 DIAGNOSIS — I509 Heart failure, unspecified: Secondary | ICD-10-CM | POA: Insufficient documentation

## 2012-08-24 DIAGNOSIS — I5022 Chronic systolic (congestive) heart failure: Secondary | ICD-10-CM | POA: Insufficient documentation

## 2012-08-24 LAB — BASIC METABOLIC PANEL
BUN: 25 mg/dL — ABNORMAL HIGH (ref 6–23)
CO2: 19 mEq/L (ref 19–32)
Calcium: 9.5 mg/dL (ref 8.4–10.5)
Chloride: 97 mEq/L (ref 96–112)
Creatinine, Ser: 0.95 mg/dL (ref 0.50–1.10)
GFR calc Af Amer: 70 mL/min — ABNORMAL LOW (ref >90)
GFR calc non Af Amer: 60 mL/min — ABNORMAL LOW (ref >90)
Glucose, Bld: 71 mg/dL (ref 70–99)
Potassium: 3.9 mEq/L (ref 3.5–5.1)
Sodium: 138 mEq/L (ref 135–145)

## 2012-08-24 NOTE — Assessment & Plan Note (Addendum)
NYHA III. Volume status stable. BP soft will not titrate HF medications. Check BMET today. Follow up in 3 weeks with an ECHO.

## 2012-08-24 NOTE — Progress Notes (Signed)
Patient ID: Angela [name], female   DOB: 11/22/1945, 66 y.o.   MRN: [id number] Referring Physician: Dr. Lewayne Bunting Primary Care: Dr. Burton Apley Primary Cardiologist: Dr. Lewayne Bunting  Weight Range: Baseline proBNP:  HPI: Angela [name] is a 66 y.o. female with a hx of chronic systolic CHF in the setting of NICM, EF 20% (08/2011), SSS, s/p pacemaker with upgrade to a St Jude BiV-ICD after surviving VFib arrest in 2009, rheumatic heart disease, status post mitral valve replacement with a Bjork-Shiley (mechanical) prosthesis, chronic Coumadin therapy, parox AFib, s/p right femoral embolectomy in 2003. Of note, she had a LHC 4/03: Normal coronary arteries. She was admitted 08/2011 with a/c systolic CHF in the setting of AFib with RVR. Rate control was difficult and she underwent AV nodal ablation.   Admitted to [facility] 11/12-11/17 with A/C systolic HF.  She was placed on IV lasix with good diuresis.  Stay c/b hypotension therefore carvedilol decreased and losartan stopped.  Discharge weight 139 pounds.   07/31/12:  Today 243 m and O2 dropped 84% improved to 95% on 2L  She returns for follow up today with her husband. Overall she feels a little better. Continues to perform house work. Denies SOB/PND/CP. + Orthopnea sleeps on 2 pillows. On chronic 2l iters [place]. Weight at home  At home 136-140. She has required one additional lasix. No bleeding problems.   Review of Systems: All pertinent positives as in HPI, otherwise negative.   Past Medical History  Diagnosis Date  . Nonischemic cardiomyopathy     a.  11/2001 - Cath - NL Cors;  b. 01/2008 Echo - EF 10-20%;  c. Echocardiogram 09/22/11: EF 20%, MVR normal, moderate LAE, mild RAE   . Atrial fibrillation     A.  Chronic Coumadin  . Diverticular disease   . History of colon cancer     A.  1999 - T3, N1  chemotherapy  . Internal hemorrhoids   . Rheumatic heart disease     A. 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 Sick sinus syndrome     A.  s/p pacer in  1995.  B.    . Cardiac arrest - ventricular fibrillation     A.  12/2007;  B. 01/2008 St. Jude Promote Bi-V ICD placed  . Systolic CHF, chronic     A.  08/4780 Echo - EF 10-20%  . Embolus and thrombosis of iliac artery     A.  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 CVA (cerebral vascular accident)   . DVT (deep venous thrombosis)   . Arthritis     Current Outpatient Prescriptions  Medication Sig Dispense Refill  . carvedilol (COREG) 3.125 MG tablet Take 1 tablet (3.125 mg total) by mouth 2 (two) times daily with a meal.  60 tablet  5  . digoxin (LANOXIN) 0.125 MG tablet Take 1/2 tab (0.0625mg ) daily      . furosemide (LASIX) 40 MG tablet Take 1 tablet (40 mg total) by mouth 2 (two) times daily.  60 tablet  5  . potassium chloride (KLOR-CON) 10 MEQ CR tablet Take 2 tablets (20 mEq total) by mouth daily.  60 tablet  5  . warfarin (COUMADIN) 6 MG tablet Take 1 tablet (6 mg total) by mouth daily.  30 tablet  1    No Known Allergies  PHYSICAL EXAM: Filed Vitals:   08/24/12 0957  BP: 98/67  Pulse: 77  Weight: 141 lb 12.8 oz (64.32 kg)  SpO2: 99%  General:  Frail. appearing. No respiratory difficulty (husband  HEENT: normal Neck: supple. JVP 6-7. Carotids 2+ bilat; no bruits. No lymphadenopathy or thryomegaly appreciated. Cor: PMI nondisplaced. Mechanical S1.  Regular rate & rhythm. No rubs, gallops or murmurs. Lungs: clear Abdomen: soft, nontender, nondistended. No hepatosplenomegaly. No bruits or masses. Good bowel sounds. Extremities: no cyanosis, clubbing, rash, edema Neuro: alert & oriented x 3, cranial nerves grossly intact. moves all 4 extremities w/o difficulty. Affect pleasant.     ASSESSMENT & PLAN:

## 2012-08-24 NOTE — Patient Instructions (Addendum)
Follow up in 3 weeks with an ECHO  Do the following things EVERYDAY: 1) Weigh yourself in the morning before breakfast. Write it down and keep it in a log. 2) Take your medicines as prescribed 3) Eat low salt foods-Limit salt (sodium) to 2000 mg per day.  4) Stay as active as you can everyday 5) Limit all fluids for the day to less than 2 liters

## 2012-08-25 ENCOUNTER — Encounter: Payer: Self-pay | Admitting: *Deleted

## 2012-09-14 ENCOUNTER — Ambulatory Visit (HOSPITAL_COMMUNITY)
Admission: RE | Admit: 2012-09-14 | Discharge: 2012-09-14 | Disposition: A | Discharge status: Home / Self Care | Payer: No Typology Code available for payment source | Source: Ambulatory Visit | Attending: Internal Medicine | Admitting: Internal Medicine

## 2012-09-14 ENCOUNTER — Encounter (HOSPITAL_COMMUNITY): Payer: Self-pay

## 2012-09-14 ENCOUNTER — Ambulatory Visit (HOSPITAL_BASED_OUTPATIENT_CLINIC_OR_DEPARTMENT_OTHER)
Admission: RE | Admit: 2012-09-14 | Discharge: 2012-09-14 | Disposition: A | Discharge status: Home / Self Care | Payer: No Typology Code available for payment source | Source: Ambulatory Visit | Attending: Internal Medicine | Admitting: Internal Medicine

## 2012-09-14 VITALS — BP 108/76 | HR 84 | Wt 146.0 lb

## 2012-09-14 DIAGNOSIS — I5022 Chronic systolic (congestive) heart failure: Secondary | ICD-10-CM

## 2012-09-14 DIAGNOSIS — I369 Nonrheumatic tricuspid valve disorder, unspecified: Secondary | ICD-10-CM

## 2012-09-14 DIAGNOSIS — I509 Heart failure, unspecified: Secondary | ICD-10-CM

## 2012-09-14 MED ORDER — SPIRONOLACTONE 25 MG PO TABS: 12.5000 mg | ORAL_TABLET | Freq: Every day | ORAL | Status: DC

## 2012-09-14 NOTE — Progress Notes (Signed)
Referring Physician: Dr. Lewayne Bunting Primary Care: Dr. Burton Apley Primary Cardiologist: Dr. Lewayne Bunting  Weight Range: Baseline proBNP:  HPI: Angela [name] is a 67 y.o. female with a hx of chronic systolic CHF in the setting of NICM, EF 20% (08/2011), SSS, s/p pacemaker with upgrade to a St Jude BiV-ICD after surviving VFib arrest in 2009, rheumatic heart disease, status post mitral valve replacement with a Bjork-Shiley (mechanical) prosthesis, chronic Coumadin therapy, parox AFib, s/p right femoral embolectomy in 2003. Of note, she had a LHC 4/03: Normal coronary arteries. She was admitted 08/2011 with a/c systolic CHF in the setting of AFib with RVR. Rate control was difficult and she underwent AV nodal ablation.   Admitted to [facility] 11/12-11/17 with A/C systolic HF.  She was placed on IV lasix with good diuresis.  Stay c/b hypotension therefore carvedilol decreased and losartan stopped.  Discharge weight 139 pounds.   07/31/12:  Today 243 m and O2 dropped 84% improved to 95% on 2L  Echo 09/14/12: EF 15-20%.  Dilated  She returns for follow up today with her husband. Feels pretty good.  Feels better with O2 on.  She denies orthopnea/PND.  139-140 pounds at home.  No dizziness/syncope.  No edema.  On chronic O2.  She is doing some chores around the house.     Review of Systems: All pertinent positives as in HPI, otherwise negative.   Past Medical History  Diagnosis Date  . Nonischemic cardiomyopathy     a.  11/2001 - Cath - NL Cors;  b. 01/2008 Echo - EF 10-20%;  c. Echocardiogram 09/22/11: EF 20%, MVR normal, moderate LAE, mild RAE   . Atrial fibrillation     A.  Chronic Coumadin  . Diverticular disease   . History of colon cancer     A.  1999 - T3, N1  chemotherapy  . Internal hemorrhoids   . Rheumatic heart disease     A. 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 Sick sinus syndrome     A.  s/p pacer in 1995.  B.    . Cardiac arrest - ventricular fibrillation     A.  12/2007;  B. 01/2008  St. Jude Promote Bi-V ICD placed  . Systolic CHF, chronic     A.  08/6107 Echo - EF 10-20%  . Embolus and thrombosis of iliac artery     A.  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 CVA (cerebral vascular accident)   . DVT (deep venous thrombosis)   . Arthritis     Current Outpatient Prescriptions  Medication Sig Dispense Refill  . carvedilol (COREG) 3.125 MG tablet Take 1 tablet (3.125 mg total) by mouth 2 (two) times daily with a meal.  60 tablet  5  . furosemide (LASIX) 40 MG tablet Take 1 tablet (40 mg total) by mouth 2 (two) times daily.  60 tablet  5  . potassium chloride (KLOR-CON) 10 MEQ CR tablet Take 2 tablets (20 mEq total) by mouth daily.  60 tablet  5  . warfarin (COUMADIN) 6 MG tablet Take 1 tablet (6 mg total) by mouth daily.  30 tablet  1  . digoxin (LANOXIN) 0.125 MG tablet Take 1/2 tab (0.0625mg ) daily        No Known Allergies  PHYSICAL EXAM: Filed Vitals:   09/14/12 1424  BP: 108/76  Pulse: 84  Weight: 146 lb (66.225 kg)  SpO2: 100%    General:  Frail. appearing. No respiratory difficulty (husband  HEENT: normal Neck: supple. JVP 7-8. Carotids 2+ bilat; no bruits. No lymphadenopathy or thryomegaly appreciated. Cor: PMI nondisplaced. Mechanical S1.  Regular rate & rhythm. No rubs, gallops or murmurs. Lungs: clear Abdomen: soft, nontender, nondistended. No hepatosplenomegaly. No bruits or masses. Good bowel sounds. Extremities: no cyanosis, clubbing, rash, edema Neuro: alert & oriented x 3, cranial nerves grossly intact. moves all 4 extremities w/o difficulty. Affect pleasant.     ASSESSMENT & PLAN:

## 2012-09-14 NOTE — Progress Notes (Signed)
Echocardiogram 2D Echocardiogram has been performed.  Angela [name] A 09/14/2012, 2:13 PM

## 2012-09-14 NOTE — Patient Instructions (Addendum)
Stop potassium and start spironolactone 12.5 mg (0.5 tab)  Labs in 1 week.  Follow up 1 month

## 2012-09-15 NOTE — Assessment & Plan Note (Addendum)
NYHA III.  Volume status mildly elevated.  Will add low dose spironolactone and stop potassium.  Will have follow up labs in 1 week and in 1 month.  She says she feels better with O2 use.  Have instructed her to watch closely for hypotension.    Patient seen and examined with Ulyess Blossom, PA-C. We discussed all aspects of the encounter. I agree with the assessment and plan as stated above.  Very difficult situation. She appears frail and end-stage but reports that she is doing ok. She does have mild volume overload. Will add spiro and stop KCL. Not candidate for advanced therapies.

## 2012-09-22 ENCOUNTER — Other Ambulatory Visit (INDEPENDENT_AMBULATORY_CARE_PROVIDER_SITE_OTHER): Payer: No Typology Code available for payment source

## 2012-09-22 DIAGNOSIS — I509 Heart failure, unspecified: Secondary | ICD-10-CM

## 2012-09-22 DIAGNOSIS — I5022 Chronic systolic (congestive) heart failure: Secondary | ICD-10-CM

## 2012-09-22 LAB — BASIC METABOLIC PANEL
BUN: 29 mg/dL — ABNORMAL HIGH (ref 6–23)
CO2: 29 mEq/L (ref 19–32)
Calcium: 9.3 mg/dL (ref 8.4–10.5)
Chloride: 97 mEq/L (ref 96–112)
Creatinine, Ser: 1 mg/dL (ref 0.4–1.2)
GFR: 69.2 mL/min (ref >60.00)
Glucose, Bld: 72 mg/dL (ref 70–99)
Potassium: 3.5 mEq/L (ref 3.5–5.1)
Sodium: 135 mEq/L (ref 135–145)

## 2012-10-15 ENCOUNTER — Ambulatory Visit (HOSPITAL_COMMUNITY)
Admission: RE | Admit: 2012-10-15 | Discharge: 2012-10-15 | Disposition: A | Discharge status: Home / Self Care | Payer: No Typology Code available for payment source | Source: Ambulatory Visit | Attending: Internal Medicine | Admitting: Internal Medicine

## 2012-10-15 ENCOUNTER — Encounter (HOSPITAL_COMMUNITY): Payer: Self-pay

## 2012-10-15 VITALS — BP 106/68 | Wt 143.8 lb

## 2012-10-15 DIAGNOSIS — Z952 Presence of prosthetic heart valve: Secondary | ICD-10-CM

## 2012-10-15 DIAGNOSIS — I509 Heart failure, unspecified: Secondary | ICD-10-CM | POA: Insufficient documentation

## 2012-10-15 DIAGNOSIS — I5022 Chronic systolic (congestive) heart failure: Secondary | ICD-10-CM | POA: Insufficient documentation

## 2012-10-15 DIAGNOSIS — Z954 Presence of other heart-valve replacement: Secondary | ICD-10-CM

## 2012-10-15 LAB — BASIC METABOLIC PANEL
BUN: 25 mg/dL — ABNORMAL HIGH (ref 6–23)
CO2: 27 mEq/L (ref 19–32)
Calcium: 9.4 mg/dL (ref 8.4–10.5)
Chloride: 99 mEq/L (ref 96–112)
Creatinine, Ser: 0.98 mg/dL (ref 0.50–1.10)
GFR calc Af Amer: 67 mL/min — ABNORMAL LOW (ref >90)
GFR calc non Af Amer: 58 mL/min — ABNORMAL LOW (ref >90)
Glucose, Bld: 81 mg/dL (ref 70–99)
Potassium: 3.8 mEq/L (ref 3.5–5.1)
Sodium: 137 mEq/L (ref 135–145)

## 2012-10-15 LAB — PRO B NATRIURETIC PEPTIDE: Pro B Natriuretic peptide (BNP): 3775 pg/mL — ABNORMAL HIGH (ref 0–125)

## 2012-10-15 LAB — DIGOXIN LEVEL: Digoxin Level: 0.4 ng/mL — ABNORMAL LOW (ref 0.8–2.0)

## 2012-10-15 NOTE — Assessment & Plan Note (Signed)
Stable MVR. Continue coumadin. Reminded for need of SBE prophylaxis.

## 2012-10-15 NOTE — Progress Notes (Signed)
Referring Physician: Dr. Lewayne Bunting Primary Care: Dr. Burton Apley Primary Cardiologist: Dr. Lewayne Bunting  Weight Range: Baseline proBNP:  HPI: Angela [name] is a 67 y.o. female with a hx of chronic systolic CHF in the setting of NICM, EF 20% (08/2011), SSS, s/p pacemaker with upgrade to a St Jude BiV-ICD after surviving VFib arrest in 2009, rheumatic heart disease, status post mitral valve replacement with a Bjork-Shiley (mechanical) prosthesis, chronic Coumadin therapy, parox AFib, s/p right femoral embolectomy in 2003. Of note, she had a LHC 4/03: Normal coronary arteries. She was admitted 08/2011 with a/c systolic CHF in the setting of AFib with RVR. Rate control was difficult and she underwent AV nodal ablation.   Admitted to [facility] 11/12-11/17 with A/C systolic HF.  She was placed on IV lasix with good diuresis.  Stay c/b hypotension therefore carvedilol decreased and losartan stopped.  Discharge weight 139 pounds.   07/31/12: 243 m and O2 dropped 84% improved to 95% on 2L  Echo 09/14/12: EF 15-20%.  Dilated. Mild to moderate RV dysfunction.  She returns for follow up today with her husband. Very difficult historian. Feels pretty good.  Feels better with O2 on. At last visit we started spironolactone and stopped Kcl. Continues to do daily weights at home. Said weight usually 139-140. Today 143 . In clinic was 146 last month and 143.8 today. Compliant with meds. Not using sliding scale lasix. Remains on coumadin for MVR. No bleeding.    Review of Systems: All pertinent positives as in HPI, otherwise negative.   Past Medical History  Diagnosis Date  . Nonischemic cardiomyopathy     a.  11/2001 - Cath - NL Cors;  b. 01/2008 Echo - EF 10-20%;  c. Echocardiogram 09/22/11: EF 20%, MVR normal, moderate LAE, mild RAE   . Atrial fibrillation     A.  Chronic Coumadin  . Diverticular disease   . History of colon cancer     A.  1999 - T3, N1  chemotherapy  . Internal hemorrhoids   . Rheumatic  heart disease     A. 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 Sick sinus syndrome     A.  s/p pacer in 1995.  B.    . Cardiac arrest - ventricular fibrillation     A.  12/2007;  B. 01/2008 St. Jude Promote Bi-V ICD placed  . Systolic CHF, chronic     A.  08/1912 Echo - EF 10-20%  . Embolus and thrombosis of iliac artery     A.  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 CVA (cerebral vascular accident)   . DVT (deep venous thrombosis)   . Arthritis     Current Outpatient Prescriptions  Medication Sig Dispense Refill  . carvedilol (COREG) 3.125 MG tablet Take 1 tablet (3.125 mg total) by mouth 2 (two) times daily with a meal.  60 tablet  5  . digoxin (LANOXIN) 0.125 MG tablet Take 1/2 tab (0.0625mg ) daily      . furosemide (LASIX) 40 MG tablet Take 1 tablet (40 mg total) by mouth 2 (two) times daily.  60 tablet  5  . warfarin (COUMADIN) 6 MG tablet Take 1 tablet (6 mg total) by mouth daily.  30 tablet  1  . spironolactone (ALDACTONE) 25 MG tablet Take 0.5 tablets (12.5 mg total) by mouth daily.  15 tablet  6   No current facility-administered medications for this encounter.    No Known Allergies  PHYSICAL EXAM: Filed  Vitals:   10/15/12 1009  BP: 106/68  Weight: 143 lb 12.8 oz (65.227 kg)    General:  Frail. appearing. No respiratory difficulty (husband  HEENT: normal Neck: supple. JVP 6-7. Carotids 2+ bilat; no bruits. No lymphadenopathy or thryomegaly appreciated. Cor: PMI nondisplaced. Mechanical S1.  Regular rate & rhythm. No rubs, gallops or murmurs. Lungs: clear Abdomen: soft, nontender, nondistended. No hepatosplenomegaly. No bruits or masses. Good bowel sounds. Extremities: no cyanosis, clubbing, rash,  Tr edema Neuro: alert & oriented x 3, cranial nerves grossly intact. moves all 4 extremities w/o difficulty. Affect pleasant.     ASSESSMENT & PLAN:

## 2012-10-15 NOTE — Patient Instructions (Addendum)
Labs today  Your physician recommends that you schedule a follow-up appointment in: 2 months    

## 2012-10-15 NOTE — Addendum Note (Signed)
Encounter addended by: Noralee Space, RN on: 10/15/2012 10:38 AM<BR>     Documentation filed: Orders

## 2012-10-15 NOTE — Addendum Note (Signed)
Encounter addended by: Theresia Bough, CMA on: 10/15/2012 10:57 AM<BR>     Documentation filed: Patient Instructions Section

## 2012-10-15 NOTE — Assessment & Plan Note (Signed)
Doing pretty. Volume status may be minimally up. Reinforced need for daily weights and reviewed use of sliding scale diuretics. Will try to restart Losartan at 25 daily. Watch closely for hypotension. Not candidate for advanced therapies.

## 2012-10-15 NOTE — Addendum Note (Signed)
Encounter addended by: Theresia Bough, CMA on: 10/15/2012 10:51 AM<BR>     Documentation filed: Patient Instructions Section

## 2012-11-05 ENCOUNTER — Telehealth (HOSPITAL_COMMUNITY): Payer: Self-pay | Admitting: *Deleted

## 2012-11-05 NOTE — Telephone Encounter (Signed)
Pt's husband called to state her BP was 85/66 today, he states she was c/o feeling bad so he checked BP and it was low, at last OV pt was restarted on Losartan 25 mg daily, per Dr Gala Romney stop losartan, pt's husband aware

## 2012-11-30 ENCOUNTER — Ambulatory Visit (HOSPITAL_COMMUNITY)
Admission: RE | Admit: 2012-11-30 | Discharge: 2012-11-30 | Disposition: A | Discharge status: Home / Self Care | Payer: No Typology Code available for payment source | Source: Ambulatory Visit | Attending: Internal Medicine | Admitting: Internal Medicine

## 2012-11-30 ENCOUNTER — Encounter (HOSPITAL_COMMUNITY): Payer: Self-pay

## 2012-11-30 VITALS — BP 102/68 | HR 67 | Wt 145.0 lb

## 2012-11-30 DIAGNOSIS — I509 Heart failure, unspecified: Secondary | ICD-10-CM | POA: Insufficient documentation

## 2012-11-30 DIAGNOSIS — I5022 Chronic systolic (congestive) heart failure: Secondary | ICD-10-CM | POA: Insufficient documentation

## 2012-11-30 NOTE — Progress Notes (Signed)
Patient ID: Angela [name], female   DOB: 01/28/1946, 67 y.o.   MRN: [id number] Referring Physician: Dr. Lewayne Bunting Primary Care: Dr. Burton Apley Primary Cardiologist: Dr. Lewayne Bunting  Weight Range: Baseline proBNP:  HPI: Angela [name] is a 67 y.o. female with a hx of chronic systolic CHF in the setting of NICM, EF 20% (08/2011), SSS, s/p pacemaker with upgrade to a St Jude BiV-ICD after surviving VFib arrest in 2009, rheumatic heart disease, status post mitral valve replacement with a Bjork-Shiley (mechanical) prosthesis, chronic Coumadin therapy, parox AFib, s/p right femoral embolectomy in 2003. Of note, she had a LHC 4/03: Normal coronary arteries. She was admitted 08/2011 with a/c systolic CHF in the setting of AFib with RVR. Rate control was difficult and she underwent AV nodal ablation.   Admitted to [facility] 11/12-11/17 with A/C systolic HF.  She was placed on IV lasix with good diuresis.  Stay c/b hypotension therefore carvedilol decreased and losartan stopped.  Discharge weight 139 pounds.   07/31/12: 243 m and O2 dropped 84% improved to 95% on 2L  Echo 09/14/12: EF 15-20%.  Dilated. Mild to moderate RV dysfunction.  She returns for follow up today with her husband.Poor historian.  Last visit losartan restarted but this was later stopped due to hypotension and dizziness. Denies SOB/PND/Orthopnea. Wears 2 liters [place] at night. Weight at home 139-140 pounds. She has required extra lasix twice a month. Compliant with medications.  Remains on coumadin for MVR. No bleeding.    Review of Systems: All pertinent positives as in HPI, otherwise negative.   Past Medical History  Diagnosis Date  . Nonischemic cardiomyopathy     a.  11/2001 - Cath - NL Cors;  b. 01/2008 Echo - EF 10-20%;  c. Echocardiogram 09/22/11: EF 20%, MVR normal, moderate LAE, mild RAE   . Atrial fibrillation     A.  Chronic Coumadin  . Diverticular disease   . History of colon cancer     A.  1999 - T3, N1  chemotherapy  .  Internal hemorrhoids   . Rheumatic heart disease     A. 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 Sick sinus syndrome     A.  s/p pacer in 1995.  B.    . Cardiac arrest - ventricular fibrillation     A.  12/2007;  B. 01/2008 St. Jude Promote Bi-V ICD placed  . Systolic CHF, chronic     A.  11/979 Echo - EF 10-20%  . Embolus and thrombosis of iliac artery     A.  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 CVA (cerebral vascular accident)   . DVT (deep venous thrombosis)   . Arthritis     Current Outpatient Prescriptions  Medication Sig Dispense Refill  . carvedilol (COREG) 3.125 MG tablet Take 1 tablet (3.125 mg total) by mouth 2 (two) times daily with a meal.  60 tablet  5  . digoxin (LANOXIN) 0.125 MG tablet Take 1/2 tab (0.0625mg ) daily      . furosemide (LASIX) 40 MG tablet Take 1 tablet (40 mg total) by mouth 2 (two) times daily.  60 tablet  5  . spironolactone (ALDACTONE) 25 MG tablet Take 0.5 tablets (12.5 mg total) by mouth daily.  15 tablet  6  . warfarin (COUMADIN) 6 MG tablet Take 1 tablet (6 mg total) by mouth daily.  30 tablet  1   No current facility-administered medications for this encounter.    No Known Allergies  PHYSICAL EXAM: Filed Vitals:   11/30/12 1115  BP: 102/68  Pulse: 67  Weight: 145 lb (65.772 kg)  SpO2: 97%    General:  Frail. appearing. No respiratory difficulty (husband present)  HEENT: normal Neck: supple. JVP 9-10  Carotids 2+ bilat; no bruits. No lymphadenopathy or thryomegaly appreciated. Cor: PMI nondisplaced. Mechanical S1.  Regular rate & rhythm. No rubs, gallops or murmurs. Lungs: clear Abdomen: soft, nontender, nondistended. No hepatosplenomegaly. No bruits or masses. Good bowel sounds. Extremities: no cyanosis, clubbing, rash,  Tr edema Neuro: alert & oriented x 3, cranial nerves grossly intact. moves all 4 extremities w/o difficulty. Affect pleasant.     ASSESSMENT & PLAN:

## 2012-11-30 NOTE — Patient Instructions (Addendum)
Take an extra lasix today and tomorrow  Check labs in 2 weeks  Follow up in 2 months  Do the following things EVERYDAY: 1) Weigh yourself in the morning before breakfast. Write it down and keep it in a log. 2) Take your medicines as prescribed 3) Eat low salt foods-Limit salt (sodium) to 2000 mg per day.  4) Stay as active as you can everyday 5) Limit all fluids for the day to less than 2 liters

## 2012-11-30 NOTE — Assessment & Plan Note (Addendum)
NYHA IIIB. Volume status mildly elevated. Continue lasix 40 mg twice a day and give additional 40 mg of lasix for the next 2 days. Unable to to tolerate addition of losartan due to hypotension/dizziness. Continue current medications. Check BMET in 2 weeks. Follow up in 2 months.   Patient seen and examined with Tonye Becket, NP. We discussed all aspects of the encounter. I agree with the assessment and plan as stated above. She is nearing end-stage. She has NYHA III-IIIB symptoms with intolerance of even low-dose ARB due to symptomatic hypotension. Unfortunately both her and her husband have very poor insight into the severity of her disease process. She is mildly volume overloaded on exam despite stable weight. Agree with diuretic changes as above. Reinforced need for daily weights and reviewed use of sliding scale diuretics.

## 2012-12-04 ENCOUNTER — Ambulatory Visit (INDEPENDENT_AMBULATORY_CARE_PROVIDER_SITE_OTHER): Payer: No Typology Code available for payment source | Admitting: Cardiology

## 2012-12-04 ENCOUNTER — Encounter: Payer: Self-pay | Admitting: Cardiology

## 2012-12-04 VITALS — BP 128/82 | HR 66 | Ht 67.0 in | Wt 140.0 lb

## 2012-12-04 DIAGNOSIS — I495 Sick sinus syndrome: Secondary | ICD-10-CM

## 2012-12-04 DIAGNOSIS — I5022 Chronic systolic (congestive) heart failure: Secondary | ICD-10-CM

## 2012-12-04 DIAGNOSIS — I472 Ventricular tachycardia, unspecified: Secondary | ICD-10-CM

## 2012-12-04 DIAGNOSIS — I509 Heart failure, unspecified: Secondary | ICD-10-CM

## 2012-12-04 DIAGNOSIS — I428 Other cardiomyopathies: Secondary | ICD-10-CM

## 2012-12-04 LAB — ICD DEVICE OBSERVATION
BAMS-0001: 180 {beats}/min
BATTERY VOLTAGE: 2.56 V
DEVICE MODEL ICD: 539576
FVT: 0
HV IMPEDENCE: 46 Ohm
LV LEAD IMPEDENCE ICD: 310 Ohm
LV LEAD THRESHOLD: 0.75 V
PACEART VT: 0
RV LEAD IMPEDENCE ICD: 450 Ohm
RV LEAD THRESHOLD: 0.75 V
TZAT-0001SLOWVT: 1
TZAT-0004SLOWVT: 8
TZAT-0012SLOWVT: 200 ms
TZAT-0013SLOWVT: 2
TZAT-0018SLOWVT: NEGATIVE
TZAT-0019SLOWVT: 7.5 V
TZAT-0020SLOWVT: 1 ms
TZON-0003SLOWVT: 345 ms
TZON-0004SLOWVT: 30
TZON-0005SLOWVT: 6
TZON-0010SLOWVT: 80 ms
TZST-0001SLOWVT: 2
TZST-0001SLOWVT: 3
TZST-0001SLOWVT: 4
TZST-0003SLOWVT: 830 V
TZST-0003SLOWVT: 830 V
TZST-0003SLOWVT: 830 V
VENTRICULAR PACING ICD: 85 pct
VF: 0

## 2012-12-04 NOTE — Patient Instructions (Addendum)
Your physician recommends that you continue on your current medications as directed. Please refer to the Current Medication list given to you today.  Your physician wants you to follow-up in: 6 months. You will receive a reminder letter in the mail two months in advance. If you don't receive a letter, please call our office to schedule the follow-up appointment.  Merlin remote device check on March 08, 2013

## 2012-12-04 NOTE — Progress Notes (Signed)
ELECTROPHYSIOLOGY OFFICE NOTE  Patient ID: Angela [name] MRN: [id number], DOB/AGE: November 29, 1945   Date of Visit: 12/04/2012  Primary Physician: Lorenda Peck, MD Primary Cardiologist: Gala Romney, MD Primary EP: Ladona Ridgel, MD Reason for Visit: EP/device follow-up  History of Present Illness  Angela [name] is a 67 year old woman with a NICM EF 20%, chronic systolic HF s/p BiV ICD implant, prior VF arrest, rheumatic heart disease s/p mechanical MVR and chronic atrial fibrillation s/p AV node ablation who presents today for routine electrophysiology followup. She is accompanied by her husband and is somewhat of a poor historian. Since last being seen in our clinic, she [name] she is doing fine, at her baseline it seems. She has no cardiac complaints. She was recently by Dr. Gala Romney who is adjusting her diuretic dose. She denies SOB today. She denies chest pain. She denies palpitations, dizziness, near syncope or syncope. She denies LE swelling, orthopnea, PND or recent weight gain. She denies ICD shocks. Angela [name] she is compliant and tolerating medications without difficulty.  Past Medical History Past Medical History  Diagnosis Date  . Nonischemic cardiomyopathy     a.  11/2001 - Cath - NL Cors;  b. 01/2008 Echo - EF 10-20%;  c. Echocardiogram 09/22/11: EF 20%, MVR normal, moderate LAE, mild RAE   . Atrial fibrillation     A.  Chronic Coumadin  . Diverticular disease   . History of colon cancer     A.  1999 - T3, N1  chemotherapy  . Internal hemorrhoids   . Rheumatic heart disease     A. 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 Sick sinus syndrome     A.  s/p pacer in 1995.  B.    . Cardiac arrest - ventricular fibrillation     A.  12/2007;  B. 01/2008 St. Jude Promote Bi-V ICD placed  . Systolic CHF, chronic     A.  11/979 Echo - EF 10-20%  . Embolus and thrombosis of iliac artery     A.  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 CVA (cerebral vascular accident)   . DVT  (deep venous thrombosis)   . Arthritis     Past Surgical History Past Surgical History  Procedure Laterality Date  . Mitral valve replacement, bjork-shiley valve  1983  . Biventricular aicd    . Colon surgery  1999  . History of echocardiogram  2003, 2007, 2009  . Abdominal hysterectomy       Allergies/Intolerances No Known Allergies  Current Home Medications Current Outpatient Prescriptions  Medication Sig Dispense Refill  . carvedilol (COREG) 3.125 MG tablet Take 1 tablet (3.125 mg total) by mouth 2 (two) times daily with a meal.  60 tablet  5  . digoxin (LANOXIN) 0.125 MG tablet Take 1/2 tab (0.0625mg ) daily      . furosemide (LASIX) 40 MG tablet Take 1 tablet (40 mg total) by mouth 2 (two) times daily.  60 tablet  5  . spironolactone (ALDACTONE) 25 MG tablet Take 0.5 tablets (12.5 mg total) by mouth daily.  15 tablet  6  . warfarin (COUMADIN) 6 MG tablet Take 1 tablet (6 mg total) by mouth daily.  30 tablet  1   No current facility-administered medications for this visit.    Social History Social History  . Marital Status: Married   Occupational History  . Retired    Social History Main Topics  . Smoking status: Never Smoker   . Smokeless  tobacco: Never Used  . Alcohol Use: No  . Drug Use: No   Social History Narrative   The patient is married and has 1 child.  Lives in [place] with husband.  She is retired from working at [employer].  She does not smoke or drink    Review of Systems General: No chills, fever, night sweats or weight changes Cardiovascular: No chest pain, dyspnea on exertion, edema, orthopnea, palpitations, paroxysmal nocturnal dyspnea Dermatological: No rash, lesions or masses Respiratory: No cough, dyspnea Urologic: No hematuria, dysuria Abdominal: No nausea, vomiting, diarrhea, bright red blood per rectum, melena, or hematemesis Neurologic: No visual changes, weakness, changes in mental status All other systems reviewed and are otherwise  negative except as noted above.  Physical Exam Blood pressure 128/82, pulse 66, height 5\' 7"  (1.702 m), weight 140 lb (63.504 kg).  General: Well developed, elderly appearing 67 year old female in no acute distress. HEENT: Normocephalic, atraumatic. EOMs intact. Sclera nonicteric. Oropharynx clear.  Neck: Supple. No JVD. Lungs: Respirations regular and unlabored, CTA bilaterally. No wheezes, rales or rhonchi. Heart: S1, S2 present with mechanical valve sounds notes. No murmur, rub, S3 or S4. Abdomen: Soft, non-distended.  Extremities: No clubbing, cyanosis or edema. DP/PT/Radials 2+ and equal bilaterally. Psych: Normal affect. Neuro: Alert and oriented X 3. Moves all extremities spontaneously.   Diagnostics Device interrogation today - Normal BiV ICD function. Thresholds and sensing consistent with previous device measurements. Lead impedance trends stable over time. No ventricular arrhythmia episodes recorded. Patient bi-ventricularly pacing 85% of the time. Patient with frequent PVCs, ventricular bigeminy intermittently which did not improve with change in base rate to 80 bpm. No programming changes made. Device programmed with appropriate safety margins. No changes made this session. Estimated longevity 1.7 years.   Assessment and Plan 1. NICM with chronic systolic HF s/p BiV ICD implant Normal device function - as above, BiV pacing 85% due to frequent ventricular ectopy which did not improve with increasing her base rate; consider up-titrating carvedilol (although cautiously given her h/o orthostasis, hypotension) No programming changes made today HF followed closely by Dr. Gala Romney who is currently adjusting her diuretic dose Continue current medical therapy Continue routine remote device follow-up every 3 months Return to clinic for follow-up with Dr. Ladona Ridgel in 6 months 2. Prior VF arrest, NSVT Stable No ventricular arrhythmias by device interrogation today 3. Atrial fibrillation    Prior AV node ablation Continue warfarin for embolic prophylaxis  Signed, EDMISTEN, BROOKE, PA-C 12/04/2012, 10:28 AM

## 2012-12-23 ENCOUNTER — Encounter (HOSPITAL_COMMUNITY): Payer: Self-pay | Admitting: *Deleted

## 2012-12-24 ENCOUNTER — Encounter: Payer: Self-pay | Admitting: Internal Medicine

## 2013-01-27 ENCOUNTER — Other Ambulatory Visit: Payer: Self-pay | Admitting: Cardiology

## 2013-01-27 ENCOUNTER — Other Ambulatory Visit: Payer: Self-pay

## 2013-01-27 ENCOUNTER — Telehealth: Payer: Self-pay

## 2013-01-27 MED ORDER — CARVEDILOL 3.125 MG PO TABS: 3.1250 mg | ORAL_TABLET | Freq: Two times a day (BID) | ORAL | Status: DC

## 2013-01-27 NOTE — Telephone Encounter (Signed)
carvedilol (COREG) 3.125 MG tablet  Take 1 tablet (3.125 mg total) by mouth 2 (two) times daily with a meal.   60 tablet   6  Patient Instructions  Your physician recommends that you continue on your current medications as directed. Please refer to the Current Medication list given to you today. Your physician wants you to follow-up in: 6 months. You will receive a reminder letter in the mail two months in advance. If you don't receive a letter, please call our office to schedule the follow-up appointment. Merlin remote device check on March 08, 2013 Patient Instructions History Recorded  Provider Department Encounter #  11/05/2012  2:46 PM [facility] [id number]

## 2013-01-27 NOTE — Telephone Encounter (Signed)
Please fill medication

## 2013-01-29 ENCOUNTER — Ambulatory Visit (HOSPITAL_COMMUNITY)
Admission: RE | Admit: 2013-01-29 | Discharge: 2013-01-29 | Disposition: A | Discharge status: Home / Self Care | Payer: No Typology Code available for payment source | Source: Ambulatory Visit | Attending: Internal Medicine | Admitting: Internal Medicine

## 2013-01-29 ENCOUNTER — Other Ambulatory Visit: Payer: No Typology Code available for payment source

## 2013-01-29 ENCOUNTER — Encounter (HOSPITAL_COMMUNITY): Payer: Self-pay

## 2013-01-29 VITALS — BP 90/64 | HR 73 | Wt 141.1 lb

## 2013-01-29 DIAGNOSIS — Z7901 Long term (current) use of anticoagulants: Secondary | ICD-10-CM | POA: Insufficient documentation

## 2013-01-29 DIAGNOSIS — I4891 Unspecified atrial fibrillation: Secondary | ICD-10-CM | POA: Insufficient documentation

## 2013-01-29 DIAGNOSIS — I5022 Chronic systolic (congestive) heart failure: Secondary | ICD-10-CM

## 2013-01-29 DIAGNOSIS — I509 Heart failure, unspecified: Secondary | ICD-10-CM

## 2013-01-29 LAB — BASIC METABOLIC PANEL
BUN: 23 mg/dL (ref 6–23)
CO2: 25 mEq/L (ref 19–32)
Calcium: 9.5 mg/dL (ref 8.4–10.5)
Chloride: 100 mEq/L (ref 96–112)
Creatinine, Ser: 0.87 mg/dL (ref 0.50–1.10)
GFR calc Af Amer: 78 mL/min — ABNORMAL LOW (ref >90)
GFR calc non Af Amer: 67 mL/min — ABNORMAL LOW (ref >90)
Glucose, Bld: 82 mg/dL (ref 70–99)
Potassium: 3.8 mEq/L (ref 3.5–5.1)
Sodium: 136 mEq/L (ref 135–145)

## 2013-01-29 MED ORDER — CARVEDILOL 3.125 MG PO TABS: 3.1250 mg | ORAL_TABLET | Freq: Two times a day (BID) | ORAL | Status: DC

## 2013-01-29 MED ORDER — FUROSEMIDE 40 MG PO TABS: 40.0000 mg | ORAL_TABLET | Freq: Two times a day (BID) | ORAL | Status: DC

## 2013-01-29 NOTE — Assessment & Plan Note (Signed)
NYHA III. Volume status good. SBP 90's will not titrate any medications at this time. Weight stable at home. Discussion about the use of sliding scale diuretics and that if her weight is >140 lbs to take extra lasix dose. Will check BMET today. Prescriptions refilled. Follow up 2 months.

## 2013-01-29 NOTE — Progress Notes (Signed)
Patient ID: Angela [name], female   DOB: 1946-08-13, 67 y.o.   MRN: [id number]  Referring Physician: Dr. Lewayne Bunting Primary Care: Dr. Burton Apley Primary Cardiologist: Dr. Lewayne Bunting  Weight Range: Baseline proBNP:  HPI: Angela [name] is a 67 y.o. female with a hx of chronic systolic CHF in the setting of NICM, EF 20% (08/2011), SSS, s/p pacemaker with upgrade to a St Jude BiV-ICD after surviving VFib arrest in 2009, rheumatic heart disease, status post mitral valve replacement with a Bjork-Shiley (mechanical) prosthesis, chronic Coumadin therapy, parox AFib, s/p right femoral embolectomy in 2003. Of note, she had a LHC 4/03: Normal coronary arteries. She was admitted 08/2011 with a/c systolic CHF in the setting of AFib with RVR. Rate control was difficult and she underwent AV nodal ablation.   Admitted to [facility] 11/12-11/17 with A/C systolic HF.  She was placed on IV lasix with good diuresis.  Stay c/b hypotension therefore carvedilol decreased and losartan stopped.  Discharge weight 139 pounds.   07/31/12: 243 m and O2 dropped 84% improved to 95% on 2L  Echo 09/14/12: EF 15-20%.  Dilated. Mild to moderate RV dysfunction.  Follow up: Last visit increased lasix to 40 mg TID for 2 days. Presents with husband, she is a poor historian. Husband reports she is getting around pretty well, was able to walk from front desk back to clinic with no SOB. Taking medications as prescribed. Weight at home 137-139 lbs. Has not required any extra lasix. Denies SOB/orthopnea or CP. Occasionally wears 2 L [place] at night.    Review of Systems: All pertinent positives as in HPI, otherwise negative.   Past Medical History  Diagnosis Date  . Nonischemic cardiomyopathy     a.  11/2001 - Cath - NL Cors;  b. 01/2008 Echo - EF 10-20%;  c. Echocardiogram 09/22/11: EF 20%, MVR normal, moderate LAE, mild RAE   . Atrial fibrillation     A.  Chronic Coumadin  . Diverticular disease   . History of colon cancer     A.   1999 - T3, N1  chemotherapy  . Internal hemorrhoids   . Rheumatic heart disease     A. 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 Sick sinus syndrome     A.  s/p pacer in 1995.  B.    . Cardiac arrest - ventricular fibrillation     A.  12/2007;  B. 01/2008 St. Jude Promote Bi-V ICD placed  . Systolic CHF, chronic     A.  11/979 Echo - EF 10-20%  . Embolus and thrombosis of iliac artery     A.  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 CVA (cerebral vascular accident)   . DVT (deep venous thrombosis)   . Arthritis     Current Outpatient Prescriptions  Medication Sig Dispense Refill  . carvedilol (COREG) 3.125 MG tablet Take 1 tablet (3.125 mg total) by mouth 2 (two) times daily with a meal.  60 tablet  0  . digoxin (LANOXIN) 0.125 MG tablet Take 1/2 tab (0.0625mg ) daily      . furosemide (LASIX) 40 MG tablet Take 1 tablet (40 mg total) by mouth 2 (two) times daily.  60 tablet  5  . spironolactone (ALDACTONE) 25 MG tablet Take 0.5 tablets (12.5 mg total) by mouth daily.  15 tablet  6  . warfarin (COUMADIN) 6 MG tablet Take 1 tablet (6 mg total) by mouth daily.  30 tablet  1   No  current facility-administered medications for this encounter.    No Known Allergies  PHYSICAL EXAM: Filed Vitals:   01/29/13 1010  BP: 90/64  Pulse: 73  Weight: 141 lb 1.9 oz (64.012 kg)  SpO2: 99%    General:  Frail. appearing. No respiratory difficulty (husband present)  HEENT: normal Neck: supple. JVP 7  Carotids 2+ bilat; no bruits. No lymphadenopathy or thryomegaly appreciated. Cor: PMI nondisplaced. Mechanical S1.  Regular rate & rhythm. No rubs, gallops or murmurs. Lungs: clear Abdomen: soft, nontender, nondistended. No hepatosplenomegaly. No bruits or masses. Good bowel sounds. Extremities: no cyanosis, clubbing, rash,  edema Neuro: alert & oriented x 3, cranial nerves grossly intact. moves all 4 extremities w/o difficulty. Affect pleasant.     ASSESSMENT & PLAN:

## 2013-01-29 NOTE — Patient Instructions (Addendum)
Follow up 2 months.  Do the following things EVERYDAY: 1) Weigh yourself in the morning before breakfast. Write it down and keep it in a log. 2) Take your medicines as prescribed 3) Eat low salt foods-Limit salt (sodium) to 2000 mg per day.  4) Stay as active as you can everyday 5) Limit all fluids for the day to less than 2 liters  

## 2013-02-25 ENCOUNTER — Other Ambulatory Visit: Payer: Self-pay | Admitting: *Deleted

## 2013-02-25 MED ORDER — CARVEDILOL 3.125 MG PO TABS: 3.1250 mg | ORAL_TABLET | Freq: Two times a day (BID) | ORAL | Status: DC

## 2013-02-25 NOTE — Telephone Encounter (Signed)
Refill sent.

## 2013-03-08 ENCOUNTER — Ambulatory Visit (INDEPENDENT_AMBULATORY_CARE_PROVIDER_SITE_OTHER): Payer: No Typology Code available for payment source | Admitting: *Deleted

## 2013-03-08 ENCOUNTER — Encounter: Payer: Self-pay | Admitting: Internal Medicine

## 2013-03-08 DIAGNOSIS — I4901 Ventricular fibrillation: Secondary | ICD-10-CM

## 2013-03-08 DIAGNOSIS — Z95810 Presence of automatic (implantable) cardiac defibrillator: Secondary | ICD-10-CM

## 2013-03-08 LAB — REMOTE ICD DEVICE
BAMS-0001: 180 {beats}/min
BATTERY VOLTAGE: 2.56 V
BRDY-0002RV: 70 {beats}/min
BRDY-0004RV: 120 {beats}/min
DEVICE MODEL ICD: 539576
HV IMPEDENCE: 53 Ohm
LV LEAD IMPEDENCE ICD: 310 Ohm
RV LEAD AMPLITUDE: 11.8 mv
RV LEAD IMPEDENCE ICD: 480 Ohm
TZAT-0001SLOWVT: 1
TZAT-0004SLOWVT: 8
TZAT-0012SLOWVT: 200 ms
TZAT-0013SLOWVT: 2
TZAT-0018SLOWVT: NEGATIVE
TZAT-0019SLOWVT: 7.5 V
TZAT-0020SLOWVT: 1 ms
TZON-0003SLOWVT: 345 ms
TZON-0004SLOWVT: 30
TZON-0005SLOWVT: 6
TZON-0010SLOWVT: 80 ms
TZST-0001SLOWVT: 2
TZST-0001SLOWVT: 3
TZST-0001SLOWVT: 4
TZST-0003SLOWVT: 830 V
TZST-0003SLOWVT: 830 V
TZST-0003SLOWVT: 830 V
VENTRICULAR PACING ICD: 82 pct

## 2013-04-01 ENCOUNTER — Ambulatory Visit (HOSPITAL_COMMUNITY)
Admission: RE | Admit: 2013-04-01 | Discharge: 2013-04-01 | Disposition: A | Discharge status: Home / Self Care | Payer: No Typology Code available for payment source | Source: Ambulatory Visit | Attending: Cardiology | Admitting: Cardiology

## 2013-04-01 ENCOUNTER — Encounter (HOSPITAL_COMMUNITY): Payer: Self-pay

## 2013-04-01 VITALS — BP 100/70 | HR 70 | Wt 138.8 lb

## 2013-04-01 DIAGNOSIS — Z7901 Long term (current) use of anticoagulants: Secondary | ICD-10-CM | POA: Insufficient documentation

## 2013-04-01 DIAGNOSIS — I4891 Unspecified atrial fibrillation: Secondary | ICD-10-CM | POA: Insufficient documentation

## 2013-04-01 DIAGNOSIS — I509 Heart failure, unspecified: Secondary | ICD-10-CM

## 2013-04-01 DIAGNOSIS — Z95810 Presence of automatic (implantable) cardiac defibrillator: Secondary | ICD-10-CM | POA: Insufficient documentation

## 2013-04-01 DIAGNOSIS — I5022 Chronic systolic (congestive) heart failure: Secondary | ICD-10-CM

## 2013-04-01 LAB — BASIC METABOLIC PANEL
BUN: 22 mg/dL (ref 6–23)
CO2: 25 mEq/L (ref 19–32)
Calcium: 9.6 mg/dL (ref 8.4–10.5)
Chloride: 99 mEq/L (ref 96–112)
Creatinine, Ser: 0.91 mg/dL (ref 0.50–1.10)
GFR calc Af Amer: 73 mL/min — ABNORMAL LOW (ref >90)
GFR calc non Af Amer: 63 mL/min — ABNORMAL LOW (ref >90)
Glucose, Bld: 86 mg/dL (ref 70–99)
Potassium: 3.4 mEq/L — ABNORMAL LOW (ref 3.5–5.1)
Sodium: 138 mEq/L (ref 135–145)

## 2013-04-01 LAB — DIGOXIN LEVEL: Digoxin Level: <0.3 ng/mL — ABNORMAL LOW (ref 0.8–2.0)

## 2013-04-01 MED ORDER — CARVEDILOL 3.125 MG PO TABS: 3.1250 mg | ORAL_TABLET | Freq: Two times a day (BID) | ORAL | Status: DC

## 2013-04-01 MED ORDER — SPIRONOLACTONE 25 MG PO TABS: 12.5000 mg | ORAL_TABLET | Freq: Every day | ORAL | Status: DC

## 2013-04-01 NOTE — Progress Notes (Signed)
Patient ID: Angela [name], female   DOB: 1945/09/20, 67 y.o.   MRN: [id number]  Referring Physician: Dr. Lewayne Bunting Primary Care: Dr. Burton Apley Primary Cardiologist: Dr. Lewayne Bunting  Weight Range: Baseline proBNP:  HPI: Angela [name] is a 67 y.o. female with a hx of chronic systolic CHF in the setting of NICM, EF 20% (08/2011), SSS, s/p pacemaker with upgrade to a St Jude BiV-ICD after surviving VFib arrest in 2009, rheumatic heart disease, status post mitral valve replacement with a Bjork-Shiley (mechanical) prosthesis, chronic Coumadin therapy, parox AFib, s/p right femoral embolectomy in 2003. Of note, she had a LHC 4/03: Normal coronary arteries. She was admitted 08/2011 with a/c systolic CHF in the setting of AFib with RVR. Rate control was difficult and she underwent AV nodal ablation.   Admitted to [facility] 11/12-11/17 with A/C systolic HF.  She was placed on IV lasix with good diuresis.  Stay c/b hypotension therefore carvedilol decreased and losartan stopped.  Discharge weight 139 pounds.   07/31/12: 243 m and O2 dropped 84% improved to 95% on 2L  Echo 09/14/12: EF 15-20%.  Dilated. Mild to moderate RV dysfunction. MV prosthesis functioning well.   She is here for f/u with her husband.  She is a poor historian. Husband reports she is getting around pretty well, was able to walk from front desk back to clinic with no SOB. Taking medications as prescribed. Weighing ever day at home. Stable 137-139 lbs. Has not required any extra lasix. Denies SOB/orthopnea or CP. No dizziness.   Review of Systems: All pertinent positives as in HPI, otherwise negative.   Past Medical History  Diagnosis Date  . Nonischemic cardiomyopathy     a.  11/2001 - Cath - NL Cors;  b. 01/2008 Echo - EF 10-20%;  c. Echocardiogram 09/22/11: EF 20%, MVR normal, moderate LAE, mild RAE   . Atrial fibrillation     A.  Chronic Coumadin  . Diverticular disease   . History of colon cancer     A.  1999 - T3, N1   chemotherapy  . Internal hemorrhoids   . Rheumatic heart disease     A. 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 Sick sinus syndrome     A.  s/p pacer in 1995.  B.    . Cardiac arrest - ventricular fibrillation     A.  12/2007;  B. 01/2008 St. Jude Promote Bi-V ICD placed  . Systolic CHF, chronic     A.  11/979 Echo - EF 10-20%  . Embolus and thrombosis of iliac artery     A.  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 CVA (cerebral vascular accident)   . DVT (deep venous thrombosis)   . Arthritis     Current Outpatient Prescriptions  Medication Sig Dispense Refill  . carvedilol (COREG) 3.125 MG tablet Take 1 tablet (3.125 mg total) by mouth 2 (two) times daily with a meal.  60 tablet  5  . digoxin (LANOXIN) 0.125 MG tablet Take 1/2 tab (0.0625mg ) daily      . furosemide (LASIX) 40 MG tablet Take 1 tablet (40 mg total) by mouth 2 (two) times daily.  60 tablet  5  . spironolactone (ALDACTONE) 25 MG tablet Take 0.5 tablets (12.5 mg total) by mouth daily.  15 tablet  6  . warfarin (COUMADIN) 6 MG tablet Take 1 tablet (6 mg total) by mouth daily.  30 tablet  1   No current facility-administered medications for this  encounter.    No Known Allergies  PHYSICAL EXAM: Filed Vitals:   04/01/13 1055  BP: 100/70  Pulse: 70  Weight: 138 lb 12.8 oz (62.959 kg)  SpO2: 98%    General:  Frail. appearing. No respiratory difficulty (husband present)  HEENT: normal Neck: supple. JVP 7-8  Carotids 2+ bilat; no bruits. No lymphadenopathy or thryomegaly appreciated. Cor: PMI nondisplaced. Mechanical S1.  Regular rate & rhythm. Soft s3. Lungs: clear Abdomen: soft, nontender, nondistended. No hepatosplenomegaly. No bruits or masses. Good bowel sounds. Extremities: no cyanosis, clubbing, rash,  edema Neuro: alert & oriented x 3, cranial nerves grossly intact. moves all 4 extremities w/o difficulty. Affect pleasant.     ASSESSMENT   1. Chronic systolic HF due to NICM EF 20%, NYHA III 2.  SSS,  s/p pacemaker with upgrade to a St Jude BiV-ICD after surviving VFib arrest 3.  Rheumatic heart disease, status post mitral valve replacement with a Bjork-Shiley (mechanical) prosthesis,  4.  PAF on  chronic Coumadin therapy       -- s/p right femoral embolectomy in 2003. 5.  Probable dementia  PLAN:  She has severe LV dysfunction and a prominent S3 on exam but both her and her husband feel she is relatively asymptomatic. Med titration limited by low BP. Will continue current regimen. No role for advanced therapies.   Daniel Bensimhon,MD 5:25 PM

## 2013-04-01 NOTE — Patient Instructions (Addendum)
Labs today.  Your refills have been sent to [facility]  Your physician recommends that you schedule a follow-up appointment in: 3 months

## 2013-04-15 ENCOUNTER — Telehealth (HOSPITAL_COMMUNITY): Payer: Self-pay | Admitting: *Deleted

## 2013-04-15 DIAGNOSIS — I5022 Chronic systolic (congestive) heart failure: Secondary | ICD-10-CM

## 2013-04-15 NOTE — Telephone Encounter (Signed)
Labs 9/4 at [facility]

## 2013-04-15 NOTE — Telephone Encounter (Signed)
Message copied by Noralee Space on Thu Apr 15, 2013 10:04 AM ------      Message from: Dolores Patty      Created: Fri Apr 09, 2013  3:11 PM       ADD KCL 20 DAILY. RECHECK 2 WEEKS. ------

## 2013-04-16 ENCOUNTER — Encounter: Payer: Self-pay | Admitting: *Deleted

## 2013-04-29 ENCOUNTER — Other Ambulatory Visit (INDEPENDENT_AMBULATORY_CARE_PROVIDER_SITE_OTHER): Payer: No Typology Code available for payment source

## 2013-04-29 DIAGNOSIS — I5022 Chronic systolic (congestive) heart failure: Secondary | ICD-10-CM

## 2013-04-29 LAB — BASIC METABOLIC PANEL
BUN: 10 mg/dL (ref 6–23)
CO2: 30 mEq/L (ref 19–32)
Calcium: 9 mg/dL (ref 8.4–10.5)
Chloride: 102 mEq/L (ref 96–112)
Creatinine, Ser: 0.8 mg/dL (ref 0.4–1.2)
GFR: 98.5 mL/min (ref >60.00)
Glucose, Bld: 81 mg/dL (ref 70–99)
Potassium: 3.7 mEq/L (ref 3.5–5.1)
Sodium: 136 mEq/L (ref 135–145)

## 2013-05-18 ENCOUNTER — Other Ambulatory Visit: Payer: Self-pay

## 2013-05-18 DIAGNOSIS — Z1231 Encounter for screening mammogram for malignant neoplasm of breast: Secondary | ICD-10-CM

## 2013-06-18 ENCOUNTER — Ambulatory Visit
Admission: RE | Admit: 2013-06-18 | Discharge: 2013-06-18 | Disposition: A | Discharge status: Home / Self Care | Payer: No Typology Code available for payment source | Source: Ambulatory Visit

## 2013-06-18 DIAGNOSIS — Z1231 Encounter for screening mammogram for malignant neoplasm of breast: Secondary | ICD-10-CM

## 2013-08-12 ENCOUNTER — Ambulatory Visit (HOSPITAL_COMMUNITY)
Admission: RE | Admit: 2013-08-12 | Discharge: 2013-08-12 | Disposition: A | Discharge status: Home / Self Care | Payer: No Typology Code available for payment source | Source: Ambulatory Visit | Attending: Internal Medicine | Admitting: Internal Medicine

## 2013-08-12 ENCOUNTER — Encounter (HOSPITAL_COMMUNITY): Payer: Self-pay

## 2013-08-12 VITALS — BP 104/60 | HR 80 | Wt 143.8 lb

## 2013-08-12 DIAGNOSIS — I5022 Chronic systolic (congestive) heart failure: Secondary | ICD-10-CM | POA: Insufficient documentation

## 2013-08-12 DIAGNOSIS — I509 Heart failure, unspecified: Secondary | ICD-10-CM

## 2013-08-12 DIAGNOSIS — I4891 Unspecified atrial fibrillation: Secondary | ICD-10-CM

## 2013-08-12 DIAGNOSIS — Z7901 Long term (current) use of anticoagulants: Secondary | ICD-10-CM | POA: Insufficient documentation

## 2013-08-12 LAB — BASIC METABOLIC PANEL
BUN: 16 mg/dL (ref 6–23)
CO2: 27 mEq/L (ref 19–32)
Calcium: 9.4 mg/dL (ref 8.4–10.5)
Chloride: 99 mEq/L (ref 96–112)
Creatinine, Ser: 0.93 mg/dL (ref 0.50–1.10)
GFR calc Af Amer: 71 mL/min — ABNORMAL LOW (ref >90)
GFR calc non Af Amer: 61 mL/min — ABNORMAL LOW (ref >90)
Glucose, Bld: 72 mg/dL (ref 70–99)
Potassium: 4.2 mEq/L (ref 3.5–5.1)
Sodium: 136 mEq/L (ref 135–145)

## 2013-08-12 LAB — PRO B NATRIURETIC PEPTIDE: Pro B Natriuretic peptide (BNP): 3459 pg/mL — ABNORMAL HIGH (ref 0–125)

## 2013-08-12 LAB — DIGOXIN LEVEL: Digoxin Level: 0.8 ng/mL (ref 0.8–2.0)

## 2013-08-12 MED ORDER — FUROSEMIDE 40 MG PO TABS: ORAL_TABLET | ORAL | Status: DC

## 2013-08-12 NOTE — Patient Instructions (Signed)
Change lasix to 40 mg (1 tablet) daily. If weight is greater than 140 lbs on home scale take 80 mg (2 tablets) daily.  If you get labs at another doctors office have them fax to the [facility] [phone].  Have a wonderful Christmas and New Year.  Follow up 4 months  Call any issues.  Do the following things EVERYDAY: 1) Weigh yourself in the morning before breakfast. Write it down and keep it in a log. 2) Take your medicines as prescribed 3) Eat low salt foods-Limit salt (sodium) to 2000 mg per day.  4) Stay as active as you can everyday 5) Limit all fluids for the day to less than 2 liters

## 2013-08-12 NOTE — Progress Notes (Signed)
Patient ID: Angela [name], female   DOB: 01-02-1946, 67 y.o.   MRN: [id number]  Referring Physician: Dr. Lewayne Bunting Primary Care: Dr. Burton Apley Primary Cardiologist: Dr. Lewayne Bunting  Weight Range: Baseline proBNP:  HPI: Angela [name] is a 67 y.o. female with a hx of chronic systolic CHF in the setting of NICM, EF 20% (08/2011), SSS, s/p pacemaker with upgrade to a St Jude BiV-ICD after surviving VFib arrest in 2009, rheumatic heart disease, status post mitral valve replacement with a Bjork-Shiley (mechanical) prosthesis, chronic Coumadin therapy, parox AFib, s/p right femoral embolectomy in 2003. Of note, she had a LHC 4/03: Normal coronary arteries. She was admitted 08/2011 with a/c systolic CHF in the setting of AFib with RVR. Rate control was difficult and she underwent AV nodal ablation.   Admitted to [facility] 11/12-11/17 with A/C systolic HF.  She was placed on IV lasix with good diuresis.  Stay c/b hypotension therefore carvedilol decreased and losartan stopped.  Discharge weight 139 pounds.   07/31/12: 243 m and O2 dropped 84% improved to 95% on 2L  Echo 09/14/12: EF 15-20%.  Dilated. Mild to moderate RV dysfunction. MV prosthesis functioning well.   Follow up: Doing well. Does not want oxygen anymore she is not using and husband thinks is doing well. Denies SOB, PND, CP or edema. Taking medications as prescribed. Following a low salt diet. Trying to drink less than 2L a day.  Weight stable 135-137 lbs. About a month ago started taking lasix once a day instead of two a day.   Review of Systems: All pertinent positives as in HPI, otherwise negative.   Past Medical History  Diagnosis Date  . Nonischemic cardiomyopathy     a.  11/2001 - Cath - NL Cors;  b. 01/2008 Echo - EF 10-20%;  c. Echocardiogram 09/22/11: EF 20%, MVR normal, moderate LAE, mild RAE   . Atrial fibrillation     A.  Chronic Coumadin  . Diverticular disease   . History of colon cancer     A.  1999 - T3, N1   chemotherapy  . Internal hemorrhoids   . Rheumatic heart disease     A. 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 Sick sinus syndrome     A.  s/p pacer in 1995.  B.    . Cardiac arrest - ventricular fibrillation     A.  12/2007;  B. 01/2008 St. Jude Promote Bi-V ICD placed  . Systolic CHF, chronic     A.  11/979 Echo - EF 10-20%  . Embolus and thrombosis of iliac artery     A.  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 CVA (cerebral vascular accident)   . DVT (deep venous thrombosis)   . Arthritis     Current Outpatient Prescriptions  Medication Sig Dispense Refill  . carvedilol (COREG) 3.125 MG tablet Take 1 tablet (3.125 mg total) by mouth 2 (two) times daily with a meal.  60 tablet  5  . cholecalciferol (VITAMIN D) 1000 UNITS tablet Take 2,000 Units by mouth daily.      . digoxin (LANOXIN) 0.125 MG tablet Take 0.125 mg by mouth daily.      . furosemide (LASIX) 40 MG tablet Take 1 tablet (40 mg total) by mouth 2 (two) times daily.  60 tablet  5  . pentoxifylline (TRENTAL) 400 MG CR tablet Take 400 mg by mouth 2 (two) times daily as needed.      . prednisoLONE acetate (PRED  FORTE) 1 % ophthalmic suspension Place 1 drop into the right eye 4 (four) times daily.      [name] spironolactone (ALDACTONE) 25 MG tablet Take 0.5 tablets (12.5 mg total) by mouth daily.  15 tablet  6  . warfarin (COUMADIN) 6 MG tablet Take 1 tablet (6 mg total) by mouth daily.  30 tablet  1   No current facility-administered medications for this encounter.    No Known Allergies   Filed Vitals:   08/12/13 1053  BP: 104/60  Pulse: 80  Weight: 143 lb 12.8 oz (65.227 kg)  SpO2: 99%   PHYSICAL EXAM: General:  Frail. appearing. No respiratory difficulty (husband present)  HEENT: normal Neck: supple. JVP 7-8 Carotids 2+ bilat; no bruits. No lymphadenopathy or thryomegaly appreciated. Cor: PMI nondisplaced. Mechanical S1.  Regular rate & rhythm. Soft s3. Lungs: clear Abdomen: soft, nontender, nondistended. No  hepatosplenomegaly. No bruits or masses. Good bowel sounds. Extremities: no cyanosis, clubbing, rash,  edema Neuro: alert & oriented x 3, cranial nerves grossly intact. moves all 4 extremities w/o difficulty. Affect pleasant.  ASSESSMENT/PLAN   1. Chronic systolic HF: NICM, EF 20% (08/2012) - Angela [name] has NYHA II-III symptoms. She has mild dementia and is a poor historian, but the husband reports he thinks she is doing pretty well. They decided on their on to cut her lasix back to 40 mg daily instead of BID. Discussed that with last pro-BNP that she probably needs 40 mg BID. Her weight at home is 135-137 lbs on her scale, discussed taking lasix 40 mg BID if weight is greater than 140 lbs.  - Will continue coreg, spiro and digoxin at current doses. SBP soft will not titrate any medications at this time. She is not on an ACE-I and would like if at next visit her BP is ok to try to add low dose.  - Will check BMET, pro-BNP and digoxin level today. - Reinforced the need and importance of daily weights, a low sodium diet, and fluid restriction (less than 2 L a day). Instructed to call the HF clinic if weight increases more than 3 lbs overnight or 5 lbs in a week.  2.  PAF on chronic Coumadin therapy - appears to be in NSR today. Denies any bleeding issues. Will continue coumadin, which is managed at [facility] coumadin clinic.  F/U 4 months Ulla Potash B NP- C 11:01 AM

## 2013-08-17 ENCOUNTER — Telehealth (HOSPITAL_COMMUNITY): Payer: Self-pay | Admitting: *Deleted

## 2013-08-17 DIAGNOSIS — I5022 Chronic systolic (congestive) heart failure: Secondary | ICD-10-CM

## 2013-08-17 NOTE — Telephone Encounter (Signed)
Per Tonye Becket, NP d/c oxygen as pt refuses to wear it and wants it removed from her home, order placed and mess sent to [facility]

## 2013-11-05 ENCOUNTER — Other Ambulatory Visit (HOSPITAL_COMMUNITY): Payer: Self-pay | Admitting: Cardiology

## 2013-12-07 ENCOUNTER — Encounter (HOSPITAL_COMMUNITY): Payer: Self-pay

## 2013-12-17 ENCOUNTER — Ambulatory Visit (HOSPITAL_COMMUNITY)
Admission: RE | Admit: 2013-12-17 | Discharge: 2013-12-17 | Disposition: A | Discharge status: Home / Self Care | Payer: Medicare HMO | Source: Ambulatory Visit | Attending: Internal Medicine | Admitting: Internal Medicine

## 2013-12-17 VITALS — BP 126/88 | HR 77 | Wt 143.4 lb

## 2013-12-17 DIAGNOSIS — I428 Other cardiomyopathies: Secondary | ICD-10-CM | POA: Insufficient documentation

## 2013-12-17 DIAGNOSIS — I4891 Unspecified atrial fibrillation: Secondary | ICD-10-CM | POA: Insufficient documentation

## 2013-12-17 DIAGNOSIS — I5022 Chronic systolic (congestive) heart failure: Secondary | ICD-10-CM | POA: Insufficient documentation

## 2013-12-17 DIAGNOSIS — I509 Heart failure, unspecified: Secondary | ICD-10-CM

## 2013-12-17 DIAGNOSIS — F0390 Unspecified dementia without behavioral disturbance: Secondary | ICD-10-CM | POA: Insufficient documentation

## 2013-12-17 MED ORDER — LISINOPRIL 2.5 MG PO TABS: 2.5000 mg | ORAL_TABLET | Freq: Every day | ORAL | Status: DC

## 2013-12-17 NOTE — Progress Notes (Signed)
Patient ID: Angela [name], female   DOB: 07/19/1946, 68 y.o.   MRN: [id number]  Referring Physician: Dr. Cristopher Peru Primary Care: Dr. Lorene Dy Primary Cardiologist: Dr. Cristopher Peru  Weight Range: Baseline proBNP:  HPI: Angela [name] is a 68 y.o. female with a hx of chronic systolic CHF in the setting of NICM, EF 20% (08/2011), SSS, s/p pacemaker with upgrade to a St Jude BiV-ICD after surviving VFib arrest in 2009, rheumatic heart disease, status post mitral valve replacement with a Bjork-Shiley (mechanical) prosthesis, chronic Coumadin therapy, parox AFib, s/p right femoral embolectomy in 2003. Of note, she had a LHC 4/03: Normal coronary arteries. She was admitted 09/6946 with a/c systolic CHF in the setting of AFib with RVR. Rate control was difficult and she underwent AV nodal ablation.   Admitted to [facility] 54/62/70-35/00/93 with A/C systolic HF.  She was placed on IV lasix with good diuresis.  Stay c/b hypotension therefore carvedilol decreased and losartan stopped.  Discharge weight 139 pounds.   07/31/12: 6MW 243 m and O2 dropped 84% improved to 95% on 2L  Echo 09/14/12: EF 15-20%.  Dilated. Mild to moderate RV dysfunction. MV prosthesis functioning well.   Follow up: Doing well. Patient and husband cut back to 40 mg daily even though pro-BNP was elevated because they felt like she did not need BID. She has not needed any extra lasix. Denies SOB, PND, orthopnea or CP. Does not wear O2 anymore. Taking medications as prescribed. Following a low salt diet and trying to drink less than 2L a day. Weight at home 138-140 lbs.   Review of Systems: All pertinent positives as in HPI, otherwise negative.   Past Medical History  Diagnosis Date  . Nonischemic cardiomyopathy     a.  11/2001 - Cath - NL Cors;  b. 01/2008 Echo - EF 10-20%;  c. Echocardiogram 09/22/11: EF 20%, MVR normal, moderate LAE, mild RAE   . Atrial fibrillation     A.  Chronic Coumadin  . Diverticular disease   . History of  colon cancer     A.  1999 - T3, N1  chemotherapy  . Internal hemorrhoids   . Rheumatic heart disease     A. 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 Sick sinus syndrome     A.  s/p pacer in 1995.  B.    . Cardiac arrest - ventricular fibrillation     A.  12/2007;  B. 01/2008 St. Jude Promote Bi-V ICD placed  . Systolic CHF, chronic     A.  01/2008 Echo - EF 10-20%  . Embolus and thrombosis of iliac artery     A.  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 CVA (cerebral vascular accident)   . DVT (deep venous thrombosis)   . Arthritis     Current Outpatient Prescriptions  Medication Sig Dispense Refill  . carvedilol (COREG) 3.125 MG tablet Take 1 tablet (3.125 mg total) by mouth 2 (two) times daily with a meal.  60 tablet  5  . cholecalciferol (VITAMIN D) 1000 UNITS tablet Take 2,000 Units by mouth daily.      . digoxin (LANOXIN) 0.125 MG tablet Take 0.125 mg by mouth daily.      . furosemide (LASIX) 40 MG tablet Take 40 mg (1 tablet) daily. If weight is greater than 140 lbs on home scale take 80 mg (2 tablets)  60 tablet  5  . pentoxifylline (TRENTAL) 400 MG CR tablet Take 400 mg by mouth  2 (two) times daily as needed.      [name] spironolactone (ALDACTONE) 25 MG tablet TAKE ONE-HALF TABLET BY MOUTH DAILY  15 tablet  3  . warfarin (COUMADIN) 6 MG tablet Take 1 tablet (6 mg total) by mouth daily.  30 tablet  1   No current facility-administered medications for this encounter.    No Known Allergies   Filed Vitals:   12/17/13 1106  BP: 126/88  Pulse: 77  Weight: 143 lb 6.4 oz (65.046 kg)  SpO2: 98%   PHYSICAL EXAM: General:  Frail. appearing. No respiratory difficulty (husband present)  HEENT: normal Neck: supple. JVP 7-8 Carotids 2+ bilat; no bruits. No lymphadenopathy or thryomegaly appreciated. Cor: PMI nondisplaced. Mechanical S1.  Regular rate & rhythm. Soft s3. Lungs: clear Abdomen: soft, nontender, nondistended. No hepatosplenomegaly. No bruits or masses. Good bowel  sounds. Extremities: no cyanosis, clubbing, rash,  edema Neuro: alert & oriented x 3, cranial nerves grossly intact. moves all 4 extremities w/o difficulty. Affect pleasant.  ASSESSMENT/PLAN   1. Chronic systolic HF: NICM, EF 09% (08/2012) - NYHA II-III symptoms and volume status stable. The husband cut her lasix back before last visit to 40 mg daily and we talked about with pro-BNP still being elevated may need to take 40 gm BID when weight at home >140 lbs. They report they have not needed any extra lasix and that she is doing well and weight 138-140 at home. Will continue 40 mg daily with sliding scale. - Continue coreg, spiro and digoxin at current doses. Last digoxin level 0.8 (07/2013) - Will try to start low dose ACE-I, lisinopril 2.5 mg daily and told to call if any dizziness. Check BMET in 7-10 days. - Reinforced the need and importance of daily weights, a low sodium diet, and fluid restriction (less than 2 L a day). Instructed to call the HF clinic if weight increases more than 3 lbs overnight or 5 lbs in a week.  2.  PAF on chronic Coumadin therapy - appears to be in NSR today. Denies any bleeding issues. Will continue coumadin, which is managed at [facility] coumadin clinic and low dose BB. 3. Mild Dementia - Angela [name] is a poor historian, but her husband reports she is doing well. Continue to follow with PCP.   F/U 3 months Rande Brunt NP- C 11:14 AM

## 2013-12-17 NOTE — Patient Instructions (Signed)
Start lisinopril 2.5 mg daily in the evening. Call if you have any dizziness.  Go get labs at [facility] May 4th.  Follow up in 3 months  Call any issues.  Do the following things EVERYDAY: 1) Weigh yourself in the morning before breakfast. Write it down and keep it in a log. 2) Take your medicines as prescribed 3) Eat low salt foods-Limit salt (sodium) to 2000 mg per day.  4) Stay as active as you can everyday 5) Limit all fluids for the day to less than 2 liters 6)

## 2013-12-28 ENCOUNTER — Encounter: Payer: Self-pay | Admitting: Internal Medicine

## 2014-01-27 ENCOUNTER — Encounter: Payer: Self-pay | Admitting: Cardiology

## 2014-01-27 ENCOUNTER — Encounter (INDEPENDENT_AMBULATORY_CARE_PROVIDER_SITE_OTHER): Payer: Self-pay

## 2014-01-27 ENCOUNTER — Ambulatory Visit (INDEPENDENT_AMBULATORY_CARE_PROVIDER_SITE_OTHER): Payer: Medicare HMO | Admitting: Cardiology

## 2014-01-27 VITALS — BP 120/70 | HR 84 | Wt 139.0 lb

## 2014-01-27 DIAGNOSIS — I472 Ventricular tachycardia: Secondary | ICD-10-CM

## 2014-01-27 DIAGNOSIS — Z95810 Presence of automatic (implantable) cardiac defibrillator: Secondary | ICD-10-CM

## 2014-01-27 DIAGNOSIS — I428 Other cardiomyopathies: Secondary | ICD-10-CM

## 2014-01-27 DIAGNOSIS — Z9889 Other specified postprocedural states: Secondary | ICD-10-CM

## 2014-01-27 DIAGNOSIS — Z8674 Personal history of sudden cardiac arrest: Secondary | ICD-10-CM

## 2014-01-27 DIAGNOSIS — I5022 Chronic systolic (congestive) heart failure: Secondary | ICD-10-CM

## 2014-01-27 DIAGNOSIS — I4891 Unspecified atrial fibrillation: Secondary | ICD-10-CM

## 2014-01-27 DIAGNOSIS — I4729 Other ventricular tachycardia: Secondary | ICD-10-CM

## 2014-01-27 LAB — MDC_IDC_ENUM_SESS_TYPE_INCLINIC
Battery Remaining Longevity: 13.2 mo
Battery Voltage: 2.54 V
Brady Statistic RA Percent Paced: 0 %
Brady Statistic RV Percent Paced: 83 %
Date Time Interrogation Session: 20150604141727
HighPow Impedance: 48.6526
HighPow Impedance: 49 Ohm
Implantable Pulse Generator Serial Number: 539576
Lead Channel Impedance Value: 3000 Ohm
Lead Channel Impedance Value: 340 Ohm
Lead Channel Impedance Value: 450 Ohm
Lead Channel Pacing Threshold Amplitude: 0.75 V
Lead Channel Pacing Threshold Amplitude: 1 V
Lead Channel Pacing Threshold Pulse Width: 0.5 ms
Lead Channel Pacing Threshold Pulse Width: 0.6 ms
Lead Channel Sensing Intrinsic Amplitude: 12 mV
Lead Channel Setting Pacing Amplitude: 2.5 V
Lead Channel Setting Pacing Amplitude: 2.5 V
Lead Channel Setting Pacing Pulse Width: 0.5 ms
Lead Channel Setting Pacing Pulse Width: 0.6 ms
Lead Channel Setting Sensing Sensitivity: 0.3 mV
Zone Setting Detection Interval: 270 ms
Zone Setting Detection Interval: 345 ms

## 2014-01-27 NOTE — Progress Notes (Signed)
ELECTROPHYSIOLOGY OFFICE NOTE   Patient ID: Angela [name] MRN: [id number], DOB/AGE: 1946/07/02   Date of Visit: 01/27/2014  Primary Physician: Lorene Dy, MD Primary Cardiologist: Haroldine Laws, MD  Primary EP: Lovena Le, MD  Reason for Visit: EP/device follow-up   History of Present Illness  Angela [name] is a 68 year old woman with a NICM EF 54%, chronic systolic HF s/p BiV ICD implant, prior VF arrest, rheumatic heart disease s/p mechanical MVR and chronic atrial fibrillation s/p AV node ablation who presents today for routine electrophysiology followup. She is accompanied by her husband who assists with history questions. Since last being seen in our clinic, she reports she has been in her usual state of health, at her baseline it seems. She has no cardiac complaints. She is followed regularly in the CHF clinic. She denies SOB. She denies chest pain. She denies palpitations, dizziness, near syncope or syncope. She denies LE swelling, orthopnea or PND. She denies ICD shocks. [name] reports she is compliant with medications.  Past Medical History Past Medical History  Diagnosis Date  . Nonischemic cardiomyopathy     a.  11/2001 - Cath - NL Cors;  b. 01/2008 Echo - EF 10-20%;  c. Echocardiogram 09/22/11: EF 20%, MVR normal, moderate LAE, mild RAE   . Atrial fibrillation     A.  Chronic Coumadin  . Diverticular disease   . History of colon cancer     A.  1999 - T3, N1  chemotherapy  . Internal hemorrhoids   . Rheumatic heart disease     A. 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 Sick sinus syndrome     A.  s/p pacer in 1995.  B.    . Cardiac arrest - ventricular fibrillation     A.  12/2007;  B. 01/2008 St. Jude Promote Bi-V ICD placed  . Systolic CHF, chronic     A.  01/2008 Echo - EF 10-20%  . Embolus and thrombosis of iliac artery     A.  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 CVA (cerebral vascular accident)   . DVT (deep venous thrombosis)   . Arthritis     Past Surgical  History Past Surgical History  Procedure Laterality Date  . Mitral valve replacement, bjork-shiley valve  1983  . Biventricular aicd    . Colon surgery  1999  . History of echocardiogram  2003, 2007, 2009  . Abdominal hysterectomy      Allergies/Intolerances No Known Allergies  Current Home Medications Current Outpatient Prescriptions  Medication Sig Dispense Refill  . cholecalciferol (VITAMIN D) 1000 UNITS tablet Take 2,000 Units by mouth daily.      . digoxin (LANOXIN) 0.125 MG tablet Take 0.125 mg by mouth daily.      [name] lisinopril (PRINIVIL,ZESTRIL) 2.5 MG tablet Take 1 tablet (2.5 mg total) by mouth daily.  30 tablet  3  . NON FORMULARY METHYLPREDNISOLONE 4 MG  1 TAB PO BID      . pentoxifylline (TRENTAL) 400 MG CR tablet Take 400 mg by mouth 2 (two) times daily as needed.      [name] spironolactone (ALDACTONE) 25 MG tablet TAKE ONE-HALF TABLET BY MOUTH DAILY  15 tablet  3  . warfarin (COUMADIN) 6 MG tablet Take 1 tablet (6 mg total) by mouth daily.  30 tablet  1   No current facility-administered medications for this visit.    Social History History   Social History  . Marital Status: Married  Spouse Name: N/A    Number of Children: 1  . Years of Education: N/A   Occupational History  . Retired    Social History Main Topics  . Smoking status: Never Smoker   . Smokeless tobacco: Never Used  . Alcohol Use: No  . Drug Use: No  . Sexual Activity: No   Other Topics Concern  . Not on file   Social History Narrative   The patient is married and has 1 child.  Lives in [place] with husband.  She is retired from working at [employer].  She does not smoke or drink     Review of Systems General: No chills, fever, night sweats or weight changes Cardiovascular: No chest pain, dyspnea on exertion, edema, orthopnea, palpitations, paroxysmal nocturnal dyspnea Dermatological: No rash, lesions or masses Respiratory: No cough, dyspnea Urologic: No hematuria,  dysuria Abdominal: No nausea, vomiting, diarrhea, bright red blood per rectum, melena, or hematemesis Neurologic: No visual changes, weakness, changes in mental status All other systems reviewed and are otherwise negative except as noted above.  Physical Exam Vitals: Blood pressure 120/70, pulse 84, weight 139 lb (63.05 kg).  General: Well developed, elderly appearing  68 y.o. female in no acute distress. HEENT: Normocephalic, atraumatic. EOMs intact. Sclera nonicteric. Oropharynx clear.  Neck: Supple. No JVD. Lungs: Respirations regular and unlabored, CTA bilaterally. No wheezes, rales or rhonchi. Heart: Irregular. S1, S2 present with crisp mechanical valve sounds noted. No murmurs, rub, S3 or S4. Abdomen: Soft, non-distended.  Extremities: No clubbing, cyanosis or edema. PT/Radials 2+ and equal bilaterally. Neuro: Alert and oriented X 3. Moves all extremities spontaneously.   Diagnostics 12-lead ECG today - V paced with underlying AFib at 84 bpm; frequent PVCs Device interrogation today - Thresholds and sensing consistent with previous device measurements. Lead impedance trends stable over time. No ventricular arrhythmia episodes recorded. Patient bi-ventricularly pacing 83% of the time. Patient with frequent PVCs, ventricular bigeminy intermittently which did not improve with change in base rate to 80 bpm. No programming changes made. Device programmed with appropriate safety margins. No changes made this session. Estimated longevity 1.1 years.   Assessment and Plan  1. NICM with chronic systolic HF s/p BiV ICD implant  - normal device function - as above, BiV pacing 83% due to frequent ventricular ectopy which did not improve with increasing her base rate; carvedilol was discontinued due to orthostasis / hypotension and she has not been able to tolerate up-titration in the past - no programming changes made today  - HF followed closely by Dr. Haroldine Laws who is currently adjusting her  medications - continue current medical therapy  - continue routine remote device follow-up every 3 months  - return to clinic for follow-up with Dr. Lovena Le in one year  2. Prior VF arrest, NSVT  - stable  - no ventricular arrhythmias by device interrogation today   3. Atrial fibrillation  - status post AV node ablation  - continue warfarin for embolic prophylaxis  Signed, Andrez Grime, PA-C 01/27/2014, 2:18 PM

## 2014-01-27 NOTE — Patient Instructions (Addendum)
Remote monitoring is used to monitor your Pacemaker of ICD from home. This monitoring reduces the number of office visits required to check your device to one time per year. It allows [place] to keep an eye on the functioning of your device to ensure it is working properly. You are scheduled for a device check from home on 05/03/14. You may send your transmission at any time that day. If you have a wireless device, the transmission will be sent automatically. After your physician reviews your transmission, you will receive a postcard with your next transmission date.  Your physician wants you to follow-up in: 1 year with Dr. Knox Saliva will receive a reminder letter in the mail two months in advance. If you don't receive a letter, please call our office to schedule the follow-up appointment.  Your physician recommends that you continue on your current medications as directed. Please refer to the Current Medication list given to you today.

## 2014-02-08 ENCOUNTER — Encounter (HOSPITAL_COMMUNITY): Payer: Self-pay

## 2014-03-01 ENCOUNTER — Encounter: Payer: Self-pay | Admitting: Internal Medicine

## 2014-03-09 ENCOUNTER — Other Ambulatory Visit (HOSPITAL_COMMUNITY): Payer: Self-pay | Admitting: Cardiology

## 2014-03-11 ENCOUNTER — Encounter: Payer: Self-pay | Admitting: Gastroenterology

## 2014-03-15 ENCOUNTER — Encounter (HOSPITAL_COMMUNITY): Payer: Self-pay

## 2014-03-15 NOTE — Progress Notes (Signed)
Patient ID: [name], female   DOB: Aug 05, 1946, 68 y.o.   MRN: [id number]  Referring Physician: Dr. Cristopher [name] Primary Care: Dr. Lorene [name] Primary Cardiologist: Dr. Cristopher [name]    HPI: Angela [name] is a 68 y.o. female with a hx of chronic systolic CHF in the setting of NICM, SSS, s/p pacemaker with upgrade to a St Jude BiV-ICD after surviving VFib arrest in 2009, rheumatic heart disease, status post mitral valve replacement with a Bjork-Shiley (mechanical) prosthesis, chronic Coumadin therapy, PAF s/p AV nodal ablation, s/p right femoral embolectomy in 2003.   Of note, she had a LHC 4/03: Normal coronary arteries. She was admitted 04/9370 with a/c systolic CHF in the setting of AFib with RVR. Rate control was difficult and she underwent AV nodal ablation.   Admitted to [facility] 69/67/89-38/10/17 with A/C systolic HF.  She was placed on IV lasix with good diuresis.  Stay c/b hypotension therefore carvedilol decreased and losartan stopped.  Discharge weight 139 pounds.   07/31/12: 6MW 243 m and O2 dropped 84% improved to 95% on 2L  Echo 09/14/12: EF 15-20%.  Dilated. Mild to moderate RV dysfunction. MV prosthesis functioning well.   Follow up for Heart Failure: Last visit started lisinopril 2.5 mg daily but she got dizzy and it was stopped. Saw Dr. Mancel [name] in June and SBP was in the 80s and lasix and coreg were held for a few days. Feeling pretty good. Denies dizziness, SOB, orthopnea or CP. Poor historian. Taking medications as prescribed. Reports sometimes SOB with lying down. Following a low salt diet and drinking less than 2L a day. Weight at home about 135 lbs.    Review of Systems: All pertinent positives as in HPI, otherwise negative.  Family Status  Relation Status Death Age  . Father Deceased     Lung cancer  . Mother Alive     CAD s/p CABG, DM2    History   Social History  . Marital Status: Married    Spouse Name: N/A    Number of Children: 1  . Years of Education:  N/A   Occupational History  . Retired    Social History Main Topics  . Smoking status: Never Smoker   . Smokeless tobacco: Never Used  . Alcohol Use: No  . Drug Use: No  . Sexual Activity: No   Other Topics Concern  . Not on file   Social History Narrative   The patient is married and has 1 child.  Lives in [place] with husband.  She is retired from working at [employer].  She does not smoke or drink     Past Medical History  Diagnosis Date  . Nonischemic cardiomyopathy     a.  11/2001 - Cath - NL Cors; b. ECHO 09/22/11: EF 20%, MVR normal, moderate LAE, mild RAE c. ECHO (08/2012): ED 20%, diff HK, LA mod dilated, mild/mod TR, RV mild/mod decreased sys fx  . Atrial fibrillation     A.  Chronic Coumadin  . Diverticular disease   . History of colon cancer     A.  1999 - T3, N1  chemotherapy  . Internal hemorrhoids   . Rheumatic heart disease     A. 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 Sick sinus syndrome     A.  s/p pacer in 1995.  B.    . Cardiac arrest - ventricular fibrillation     A.  12/2007;  B. 01/2008 St. Jude Promote Bi-V ICD placed  .  Systolic CHF, chronic     A.  01/2008 Echo - EF 10-20%  . Embolus and thrombosis of iliac artery     A.  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 CVA (cerebral vascular accident)   . DVT (deep venous thrombosis)   . Arthritis   . Nonischemic cardiomyopathy     Current Outpatient Prescriptions  Medication Sig Dispense Refill  . carvedilol (COREG) 3.125 MG tablet Take 3.125 mg by mouth 2 (two) times daily with a meal.      . cholecalciferol (VITAMIN D) 1000 UNITS tablet Take 2,000 Units by mouth daily.      . digoxin (LANOXIN) 0.125 MG tablet Take 0.125 mg by mouth daily.      . furosemide (LASIX) 40 MG tablet Take 40 mg by mouth daily.      [name] spironolactone (ALDACTONE) 25 MG tablet TAKE 1/2 TABLET BY MOUTH DAILY  15 tablet  6  . warfarin (COUMADIN) 6 MG tablet Take 1 tablet (6 mg total) by mouth daily.  30 tablet  1   No  current facility-administered medications for this encounter.    No Known Allergies   Filed Vitals:   03/16/14 1120  BP: 111/83  Pulse: 75  Resp: 16  Weight: 133 lb 4 oz (60.442 kg)  SpO2: 99%   PHYSICAL EXAM: General:  Frail. appearing. No respiratory difficulty (husband present)  HEENT: normal Neck: supple. JVP 7Carotids 2+ bilat; no bruits. No lymphadenopathy or thryomegaly appreciated. Cor: PMI nondisplaced. Mechanical S1.  Regular rate & irregular rhythm. Soft s3. Lungs: clear Abdomen: soft, nontender, nondistended. No hepatosplenomegaly. No bruits or masses. Good bowel sounds. Extremities: no cyanosis, clubbing, rash,  edema Neuro: alert & oriented x 3, cranial nerves grossly intact. moves all 4 extremities w/o difficulty. Affect pleasant.  ASSESSMENT/PLAN   1. Chronic systolic HF: NICM, EF 39% (08/2012) - NYHA II-III symptoms and volume status stable. Will continue lasix 40 mg daily and discussed with patient and husband to take extra 40 mg of lasix if weight greater than 140 lbs.  - Continue low dose coreg 3.125 mg BID, patient has not tolerated uptitration in the past. - Continue digoxin and spironolactone at current doses. Will check dig level today along with BMET. - Did not tolerate low dose lisinopril last visit, she had orthostatic hypotension. - Reinforced the need and importance of daily weights, a low sodium diet, and fluid restriction (less than 2 L a day). Instructed to call the HF clinic if weight increases more than 3 lbs overnight or 5 lbs in a week. .  2.  PAF on chronic Coumadin therapy - Irregular by exam today but rate controlled. Denies any bleeding issues. Will continue coumadin, which is managed by PCP and low dose BB.  3. Mild Dementia - Angela [name] is a poor historian, but her husband reports she is doing well. Continue to follow with PCP.   F/U 3 months with MD Angela Brunt NP- C 11:43 AM

## 2014-03-16 ENCOUNTER — Encounter (HOSPITAL_COMMUNITY): Payer: Self-pay

## 2014-03-16 ENCOUNTER — Ambulatory Visit (HOSPITAL_COMMUNITY)
Admission: RE | Admit: 2014-03-16 | Discharge: 2014-03-16 | Disposition: A | Discharge status: Home / Self Care | Payer: Medicare HMO | Source: Ambulatory Visit | Attending: Cardiology | Admitting: Cardiology

## 2014-03-16 VITALS — BP 111/83 | HR 75 | Resp 16 | Wt 133.2 lb

## 2014-03-16 DIAGNOSIS — I509 Heart failure, unspecified: Secondary | ICD-10-CM | POA: Diagnosis not present

## 2014-03-16 DIAGNOSIS — Z8673 Personal history of transient ischemic attack (TIA), and cerebral infarction without residual deficits: Secondary | ICD-10-CM | POA: Insufficient documentation

## 2014-03-16 DIAGNOSIS — Z8249 Family history of ischemic heart disease and other diseases of the circulatory system: Secondary | ICD-10-CM | POA: Diagnosis not present

## 2014-03-16 DIAGNOSIS — I495 Sick sinus syndrome: Secondary | ICD-10-CM | POA: Insufficient documentation

## 2014-03-16 DIAGNOSIS — Z954 Presence of other heart-valve replacement: Secondary | ICD-10-CM | POA: Diagnosis not present

## 2014-03-16 DIAGNOSIS — Z7901 Long term (current) use of anticoagulants: Secondary | ICD-10-CM | POA: Diagnosis not present

## 2014-03-16 DIAGNOSIS — I4891 Unspecified atrial fibrillation: Secondary | ICD-10-CM | POA: Diagnosis not present

## 2014-03-16 DIAGNOSIS — I480 Paroxysmal atrial fibrillation: Secondary | ICD-10-CM

## 2014-03-16 DIAGNOSIS — I5022 Chronic systolic (congestive) heart failure: Secondary | ICD-10-CM

## 2014-03-16 DIAGNOSIS — Z95810 Presence of automatic (implantable) cardiac defibrillator: Secondary | ICD-10-CM | POA: Diagnosis not present

## 2014-03-16 DIAGNOSIS — I428 Other cardiomyopathies: Secondary | ICD-10-CM | POA: Insufficient documentation

## 2014-03-16 DIAGNOSIS — F0390 Unspecified dementia without behavioral disturbance: Secondary | ICD-10-CM | POA: Diagnosis not present

## 2014-03-16 LAB — BASIC METABOLIC PANEL
Anion gap: 12 (ref 5–15)
BUN: 28 mg/dL — ABNORMAL HIGH (ref 6–23)
CO2: 24 mEq/L (ref 19–32)
Calcium: 9.3 mg/dL (ref 8.4–10.5)
Chloride: 101 mEq/L (ref 96–112)
Creatinine, Ser: 0.93 mg/dL (ref 0.50–1.10)
GFR calc Af Amer: 71 mL/min — ABNORMAL LOW (ref >90)
GFR calc non Af Amer: 61 mL/min — ABNORMAL LOW (ref >90)
Glucose, Bld: 89 mg/dL (ref 70–99)
Potassium: 4.4 mEq/L (ref 3.7–5.3)
Sodium: 137 mEq/L (ref 137–147)

## 2014-03-16 LAB — DIGOXIN LEVEL: Digoxin Level: 0.6 ng/mL — ABNORMAL LOW (ref 0.8–2.0)

## 2014-03-16 NOTE — Patient Instructions (Signed)
Doing well.   Call any issues.  Will follow up in 3 months with the doctor.  Do the following things EVERYDAY: 1) Weigh yourself in the morning before breakfast. Write it down and keep it in a log. 2) Take your medicines as prescribed 3) Eat low salt foods-Limit salt (sodium) to 2000 mg per day.  4) Stay as active as you can everyday 5) Limit all fluids for the day to less than 2 liters 6)

## 2014-03-17 ENCOUNTER — Encounter (HOSPITAL_COMMUNITY): Payer: Commercial Managed Care - HMO

## 2014-03-20 ENCOUNTER — Other Ambulatory Visit: Payer: Self-pay

## 2014-03-20 MED ORDER — CARVEDILOL 3.125 MG PO TABS: 3.1250 mg | ORAL_TABLET | Freq: Two times a day (BID) | ORAL | Status: DC

## 2014-03-28 ENCOUNTER — Encounter: Payer: Self-pay | Admitting: Physician Assistant

## 2014-04-19 ENCOUNTER — Encounter: Payer: Self-pay | Admitting: Physician Assistant

## 2014-04-19 ENCOUNTER — Other Ambulatory Visit (INDEPENDENT_AMBULATORY_CARE_PROVIDER_SITE_OTHER): Payer: Commercial Managed Care - HMO

## 2014-04-19 ENCOUNTER — Ambulatory Visit (INDEPENDENT_AMBULATORY_CARE_PROVIDER_SITE_OTHER): Payer: Commercial Managed Care - HMO | Admitting: Physician Assistant

## 2014-04-19 VITALS — BP 100/70 | HR 60 | Ht 67.0 in | Wt 134.2 lb

## 2014-04-19 DIAGNOSIS — Z7901 Long term (current) use of anticoagulants: Secondary | ICD-10-CM

## 2014-04-19 DIAGNOSIS — Z954 Presence of other heart-valve replacement: Secondary | ICD-10-CM

## 2014-04-19 DIAGNOSIS — Z952 Presence of prosthetic heart valve: Secondary | ICD-10-CM

## 2014-04-19 DIAGNOSIS — I420 Dilated cardiomyopathy: Secondary | ICD-10-CM

## 2014-04-19 DIAGNOSIS — I428 Other cardiomyopathies: Secondary | ICD-10-CM

## 2014-04-19 DIAGNOSIS — Z85038 Personal history of other malignant neoplasm of large intestine: Secondary | ICD-10-CM

## 2014-04-19 LAB — BASIC METABOLIC PANEL
BUN: 16 mg/dL (ref 6–23)
CO2: 30 mEq/L (ref 19–32)
Calcium: 9.7 mg/dL (ref 8.4–10.5)
Chloride: 102 mEq/L (ref 96–112)
Creatinine, Ser: 1 mg/dL (ref 0.4–1.2)
GFR: 71.29 mL/min (ref >60.00)
Glucose, Bld: 89 mg/dL (ref 70–99)
Potassium: 3.6 mEq/L (ref 3.5–5.1)
Sodium: 139 mEq/L (ref 135–145)

## 2014-04-19 NOTE — Patient Instructions (Signed)
Please go to the basement level to have your labs drawn.  We scheduled you with [facility] for a Virtual Colonoscopy for 05-04-2014 at 9:15 am.  Pick up your prep instructions week of 04-25-2014.  [place] building, [place]  [place].

## 2014-04-19 NOTE — Progress Notes (Signed)
Reviewed and agree with management plan.  Malcolm T. Stark, MD FACG 

## 2014-04-19 NOTE — Progress Notes (Signed)
Subjective:    Patient ID: Angela [name], female    DOB: 1946/04/08, 68 y.o.   MRN: [id number]  HPI  Angela [name] is a pleasant 68 year old African American female known to Angela [name] who comes in today after receiving a  letter for recall colonoscopy. Patient has history of colon cancer which was diagnosed in 1999. This was a T3 N1 lesion for which she is status post right hemicolectomy and chemotherapy. She last underwent followup colonoscopy in 2010. This revealed moderate to severe diverticulosis a prior right hemicolectomy and one 4 mm polyp which was removed and found to be hyperplastic. She currently has no complaints, specifically no problems with abdominal pain changes in her bowel habits melena or hematochezia. She has numerous severe medical issues including a nonischemic cardiomyopathy with EF of approximately 20%. She has survived a previous V. fib arrest, has chronic systolic heart failure and is status post ICD. She is also status post mechanical mitral valve replacement with history of rheumatic heart disease. She has history of chronic atrial fibrillation and is maintained on Coumadin. She also has a dementia.    Review of Systems  Constitutional: Negative.   HENT: Negative.   Eyes: Negative.   Respiratory: Positive for shortness of breath.   Cardiovascular: Negative.   Gastrointestinal: Negative.   Endocrine: Negative.   Genitourinary: Negative.   Musculoskeletal: Negative.   Skin: Negative.   Allergic/Immunologic: Negative.   Neurological: Negative.   Hematological: Negative.   Psychiatric/Behavioral: Negative.    Outpatient Prescriptions Prior to Visit  Medication Sig Dispense Refill  . carvedilol (COREG) 3.125 MG tablet Take 1 tablet (3.125 mg total) by mouth 2 (two) times daily with a meal.  60 tablet  3  . cholecalciferol (VITAMIN D) 1000 UNITS tablet Take 2,000 Units by mouth daily.      . digoxin (LANOXIN) 0.125 MG tablet Take 0.125 mg by mouth daily.      .  furosemide (LASIX) 40 MG tablet Take 40 mg by mouth daily.      [name] spironolactone (ALDACTONE) 25 MG tablet TAKE 1/2 TABLET BY MOUTH DAILY  15 tablet  6  . warfarin (COUMADIN) 6 MG tablet Take 1 tablet (6 mg total) by mouth daily.  30 tablet  1   No facility-administered medications prior to visit.   No Known Allergies Patient Active Problem List   Diagnosis Date Noted  . Dementia 12/17/2013  . 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 Hypotension due to drugs 07/12/2012  . Hypokalemia 07/12/2012  . NSVT (nonsustained ventricular tachycardia) 07/12/2012  . Acute on chronic systolic heart failure 67/20/9470  . Ventricular fibrillation 11/11/2011  . Warfarin anticoagulation 09/22/2011  . Nonischemic cardiomyopathy   . Atrial fibrillation   . History of colon cancer   . Internal hemorrhoids   . Rheumatic heart disease   . Sick sinus syndrome   . Systolic CHF, chronic   . DIVERTICULAR DISEASE 06/05/2009   History  Substance Use Topics  . Smoking status: Never Smoker   . Smokeless tobacco: Never Used  . Alcohol Use: No   family history includes Coronary artery disease in her mother and sister; Diabetes (age of onset: 26) in her mother; Lung cancer (age of onset: 62) in her father.     Objective:   Physical Exam   well-developed older African American female in no acute distress accompanied by her husband. Blood pressure 100/70 pulse 60 height 5 foot 7 weight 134. HEENT; nontraumatic normocephalic EOMI PERRLA sclera anicteric,  Cardiovascular;irr regular rate and rhythm with S1-S2 she does have a mechanical valve click, Pulmonary; clear bilaterally, Abdomen ;soft nontender nondistended bowel sounds are active there is no palpable mass or hepatosplenomegaly, Rectal; exam not done, Ext;no clubbing cyanosis or edema skin warm and dry, Psych ;mood and affect appropriate.          Assessment & [name]:  #64  68 year old [place] American female with remote history of colon  cancer 1999 status post right hemicolectomy and chemotherapy. Last colonoscopy was done 5 years ago with finding of one 4 mm hyperplastic polyp. She comes back in today to discuss followup colonoscopy and is asymptomatic. #2 severe nonischemic cardiomyopathy with EF of approximately 20% #3 status post ICD placement #4 history of rheumatic heart disease status post mitral valve replacement with mechanical valve #5 chronic anticoagulation with Coumadin #6 chronic atrial flutter relation #7 prior V. fib arrest #8 dementia  [name]; patient is at high risk for complications with sedation for colonoscopy given significant cardiac disease. At this time she is 70 years out from her colon cancer and has had no evidence of recurrence or significant polyps since. Would not proceed with routine followup colonoscopy at this time. Risks and benefits of alternative screening discussed with the patient and her husband and they are agreeable to proceed with virtual colonoscopy which will allow her to avoid sedation and prevents need for interruption of her chronic anticoagulation.

## 2014-05-03 ENCOUNTER — Encounter: Payer: Self-pay | Admitting: Internal Medicine

## 2014-05-03 ENCOUNTER — Ambulatory Visit (INDEPENDENT_AMBULATORY_CARE_PROVIDER_SITE_OTHER): Payer: Commercial Managed Care - HMO | Admitting: *Deleted

## 2014-05-03 DIAGNOSIS — I472 Ventricular tachycardia: Secondary | ICD-10-CM

## 2014-05-03 DIAGNOSIS — I4729 Other ventricular tachycardia: Secondary | ICD-10-CM

## 2014-05-03 LAB — MDC_IDC_ENUM_SESS_TYPE_REMOTE
Battery Remaining Longevity: 5 mo
Battery Voltage: 2.51 V
Date Time Interrogation Session: 20150908060011
HighPow Impedance: 48 Ohm
Implantable Pulse Generator Serial Number: 539576
Lead Channel Impedance Value: 3000 Ohm
Lead Channel Impedance Value: 330 Ohm
Lead Channel Impedance Value: 480 Ohm
Lead Channel Pacing Threshold Amplitude: 0.75 V
Lead Channel Pacing Threshold Amplitude: 1 V
Lead Channel Pacing Threshold Pulse Width: 0.5 ms
Lead Channel Pacing Threshold Pulse Width: 0.6 ms
Lead Channel Sensing Intrinsic Amplitude: 11.8 mV
Lead Channel Setting Pacing Amplitude: 2.5 V
Lead Channel Setting Pacing Amplitude: 2.5 V
Lead Channel Setting Pacing Pulse Width: 0.5 ms
Lead Channel Setting Pacing Pulse Width: 0.6 ms
Lead Channel Setting Sensing Sensitivity: 0.3 mV
Zone Setting Detection Interval: 270 ms
Zone Setting Detection Interval: 345 ms

## 2014-05-03 NOTE — Progress Notes (Signed)
Remote ICD transmission.   

## 2014-05-04 ENCOUNTER — Ambulatory Visit
Admission: RE | Admit: 2014-05-04 | Discharge: 2014-05-04 | Disposition: A | Discharge status: Home / Self Care | Payer: Commercial Managed Care - HMO | Source: Ambulatory Visit | Attending: Physician Assistant | Admitting: Physician Assistant

## 2014-05-04 DIAGNOSIS — Z85038 Personal history of other malignant neoplasm of large intestine: Secondary | ICD-10-CM

## 2014-05-04 DIAGNOSIS — Z952 Presence of prosthetic heart valve: Secondary | ICD-10-CM

## 2014-05-04 DIAGNOSIS — I420 Dilated cardiomyopathy: Secondary | ICD-10-CM

## 2014-05-04 DIAGNOSIS — Z7901 Long term (current) use of anticoagulants: Secondary | ICD-10-CM

## 2014-05-05 ENCOUNTER — Encounter: Payer: Self-pay | Admitting: Cardiology

## 2014-08-04 ENCOUNTER — Encounter (HOSPITAL_COMMUNITY): Payer: Self-pay | Admitting: Internal Medicine

## 2014-08-04 ENCOUNTER — Ambulatory Visit (INDEPENDENT_AMBULATORY_CARE_PROVIDER_SITE_OTHER): Payer: Commercial Managed Care - HMO | Admitting: *Deleted

## 2014-08-04 DIAGNOSIS — I4729 Other ventricular tachycardia: Secondary | ICD-10-CM

## 2014-08-04 DIAGNOSIS — I472 Ventricular tachycardia: Secondary | ICD-10-CM

## 2014-08-04 NOTE — Progress Notes (Signed)
Remote ICD transmission.   

## 2014-08-05 ENCOUNTER — Encounter: Payer: Self-pay | Admitting: Internal Medicine

## 2014-08-05 LAB — MDC_IDC_ENUM_SESS_TYPE_REMOTE
Brady Statistic RV Percent Paced: 85 %
Implantable Pulse Generator Serial Number: 539576
Lead Channel Sensing Intrinsic Amplitude: 11.8 mV
Lead Channel Setting Pacing Amplitude: 2.5 V
Lead Channel Setting Pacing Amplitude: 2.5 V
Lead Channel Setting Pacing Pulse Width: 0.5 ms
Lead Channel Setting Pacing Pulse Width: 0.6 ms
Lead Channel Setting Sensing Sensitivity: 0.3 mV
Zone Setting Detection Interval: 270 ms
Zone Setting Detection Interval: 345 ms

## 2014-08-10 ENCOUNTER — Other Ambulatory Visit: Payer: Self-pay | Admitting: Cardiology

## 2014-08-10 MED ORDER — CARVEDILOL 3.125 MG PO TABS: 3.1250 mg | ORAL_TABLET | Freq: Two times a day (BID) | ORAL | Status: DC

## 2014-08-10 NOTE — Telephone Encounter (Signed)
Pt husband called requesting a medication refill for Coreg 3.125 MG 1 tablet by mouth twice daily be sent to [facility]. I informed pt husband that in the future he can contact his pharmacy and have the pharmacy contact the office for future medication refills. He verbalized understanding. Refill request completed.

## 2014-08-23 ENCOUNTER — Encounter: Payer: Self-pay | Admitting: Neurology

## 2014-08-23 ENCOUNTER — Ambulatory Visit (INDEPENDENT_AMBULATORY_CARE_PROVIDER_SITE_OTHER): Payer: Commercial Managed Care - HMO | Admitting: Neurology

## 2014-08-23 VITALS — BP 111/78 | HR 71 | Ht 67.0 in | Wt 131.0 lb

## 2014-08-23 DIAGNOSIS — R5381 Other malaise: Secondary | ICD-10-CM

## 2014-08-23 DIAGNOSIS — F0390 Unspecified dementia without behavioral disturbance: Secondary | ICD-10-CM

## 2014-08-23 DIAGNOSIS — R413 Other amnesia: Secondary | ICD-10-CM

## 2014-08-23 DIAGNOSIS — R269 Unspecified abnormalities of gait and mobility: Secondary | ICD-10-CM | POA: Insufficient documentation

## 2014-08-23 DIAGNOSIS — E538 Deficiency of other specified B group vitamins: Secondary | ICD-10-CM

## 2014-08-23 DIAGNOSIS — R5383 Other fatigue: Secondary | ICD-10-CM

## 2014-08-23 HISTORY — DX: Other amnesia: R41.3

## 2014-08-23 HISTORY — DX: Unspecified abnormalities of gait and mobility: R26.9

## 2014-08-23 NOTE — Progress Notes (Signed)
Reason for visit: Memory disturbance  Angela [name] is a 68 y.o. female  History of present illness:  Angela [name] is a 68 year old right-handed black female with a history of a cardiac arrest that occurred in 2009. The husband indicates that it took about 4 months or more for her mental status to fully recover from this, but she returned back to independent functioning. The patient however, has had a decline in her memory over the last one year. The patient has not driven a car and over one year, the last time she drove, she had problems with getting the accelerator and brake confused. The patient has not been doing any cooking for several years. 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 The patient has not had problems with controlling the bowels or the bladder. She denies any numbness of the extremities, but she does have some weakness the legs associated with a chronic gait disorder. She has not had any recent falls. She repeats herself frequently, and she gets mixed up on various appointments. The patient is able to perform basic activities of daily living such as bathing and dressing and feeding. She needs help keeping up with medications and appointments. She is sent to this office for an evaluation.  Past Medical History  Diagnosis Date  . Nonischemic cardiomyopathy     a.  11/2001 - Cath - NL Cors; b. ECHO 09/22/11: EF 20%, MVR normal, moderate LAE, mild RAE c. ECHO (08/2012): ED 20%, diff HK, LA mod dilated, mild/mod TR, RV mild/mod decreased sys fx  . Atrial fibrillation     A.  Chronic Coumadin  . Diverticular disease   . History of colon cancer     A.  1999 - T3, N1  chemotherapy  . Internal hemorrhoids   . Rheumatic heart disease     A. 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 Sick sinus syndrome     A.  s/p pacer in 1995.  B.    . Cardiac  arrest - ventricular fibrillation     A.  12/2007;  B. 01/2008 St. Jude Promote Bi-V ICD placed  . Systolic CHF, chronic     A.  01/2008 Echo - EF 10-20%  . Embolus and thrombosis of iliac artery     A.  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 CVA (cerebral vascular accident)   . DVT (deep venous thrombosis)   . Arthritis   . Memory deficit 08/23/2014  . Abnormality of gait 08/23/2014    Past Surgical History  Procedure Laterality Date  . Mitral valve replacement, bjork-shiley valve  1983  . Biventricular aicd    . Colon surgery  1999  . History of echocardiogram  2003, 2007, 2009  . Abdominal hysterectomy    . Av node ablation N/A 09/23/2011    Procedure: AV NODE ABLATION;  Surgeon: Evans Lance, MD;  Location: [facility] CATH LAB;  Service: Cardiovascular;  Laterality: N/A;    Family History  Problem Relation Age of Onset  . Diabetes Mother 77  . Coronary artery disease Mother     s/p cabg  . Lung cancer Father 63    smoker  . Coronary artery disease Sister     s/p cabg    Social history:  reports that she has never smoked. She has never used  smokeless tobacco. She reports that she does not drink alcohol or use illicit drugs.  Medications:  Current Outpatient Prescriptions on File Prior to Visit  Medication Sig Dispense Refill  . carvedilol (COREG) 3.125 MG tablet Take 1 tablet (3.125 mg total) by mouth 2 (two) times daily with a meal. 60 tablet 3  . cholecalciferol (VITAMIN D) 1000 UNITS tablet Take 2,000 Units by mouth daily.    . digoxin (LANOXIN) 0.125 MG tablet Take 0.125 mg by mouth daily.    . furosemide (LASIX) 40 MG tablet Take 40 mg by mouth daily.    [name] spironolactone (ALDACTONE) 25 MG tablet TAKE 1/2 TABLET BY MOUTH DAILY 15 tablet 6  . warfarin (COUMADIN) 6 MG tablet Take 1 tablet (6 mg total) by mouth daily. (Patient taking differently: Take 6 mg by mouth daily. Take one 6mg  tab and 1mg  tab rest of week.  For a total of 7mg .) 30 tablet 1   No current  facility-administered medications on file prior to visit.     No Known Allergies  ROS:  Out of a complete 14 system review of symptoms, the patient complains only of the following symptoms, and all other reviewed systems are negative.  Memory loss  Blood pressure 111/78, pulse 71, height 5\' 7"  (1.702 m), weight 131 lb (59.421 kg).  Physical Exam  General: The patient is alert and cooperative at the time of the examination.  Eyes: Pupils are equal, round, and reactive to light. Discs are flat bilaterally.  Neck: The neck is supple, no carotid bruits are noted.  Respiratory: The respiratory examination is clear.  Cardiovascular: The cardiovascular examination reveals an occasionally irregular rate and rhythm, no obvious murmurs or rubs are noted.  Skin: Extremities are without significant edema.  Neurologic Exam  Mental status: The Mini-Mental Status Examination done today shows a total score of 15/30.  Cranial nerves: Facial symmetry is present. There is good sensation of the face to pinprick and soft touch bilaterally. The strength of the facial muscles and the muscles to head turning and shoulder shrug are normal bilaterally. Speech is well enunciated, no aphasia or dysarthria is noted. Extraocular movements are full. Visual fields are full. The tongue is midline, and the patient has symmetric elevation of the soft palate. No obvious hearing deficits are noted.  Motor: The motor testing reveals 5 over 5 strength of all 4 extremities. Good symmetric motor tone is noted throughout.  Sensory: Sensory testing is intact to pinprick, soft touch, vibration sensation, and position sense on all 4 extremities. No evidence of extinction is noted.  Coordination: Cerebellar testing reveals good finger-nose-finger and heel-to-shin bilaterally.  Gait and station: Gait is wide-based, unsteady. The patient does not use an assistive device to walk. Tandem gait is unsteady. Romberg is negative.  No drift is seen.  Reflexes: Deep tendon reflexes are symmetric and normal bilaterally. Toes are downgoing bilaterally.   CT head 10/26/10:  IMPRESSION: 1. Stable CT appearance of the brain parenchyma argues against an acute or subacute right MCA infarct. The hyperdense branch in the right sylvian fissure is re-identified but decreased in size. This may reflect atherosclerosis. CTA for further evaluation is pending at this time. 2. No new intracranial abnormality.   Assessment/Plan:  1. Progressive memory disturbance  2. Chronic gait disturbance  3. Cerebrovascular disease  4. History of cardiac arrest  The patient has a significant cardiac history with atrial fibrillation, heart valve replacements, and a history of cardiac arrest. The patient has developed a progressive  memory disorder. Her prior cardiac history and history of cerebrovascular disease has put her at high risk for a progressive dementing illness. The patient will be sent for blood work today, and she will undergo a CT scan of the brain. She cannot have MRI of the brain as she has a defibrillator in place. The patient will be considered for medications for memory such as Aricept. She will follow-up in 6 months, but we will discuss the results of the workup prior to this.  Jill Alexanders MD 08/23/2014 10:18 PM  [facility] [address] [place], [place] [address]  Phone [phone] Fax [phone]

## 2014-08-23 NOTE — Patient Instructions (Signed)
Plans are for blood work today, and to get a CT scan of the brain. We will follow up in several months, consider a medication for memory.

## 2014-08-24 ENCOUNTER — Ambulatory Visit (INDEPENDENT_AMBULATORY_CARE_PROVIDER_SITE_OTHER): Payer: Commercial Managed Care - HMO | Admitting: Internal Medicine

## 2014-08-24 ENCOUNTER — Encounter: Payer: Self-pay | Admitting: *Deleted

## 2014-08-24 ENCOUNTER — Encounter: Payer: Self-pay | Admitting: Internal Medicine

## 2014-08-24 VITALS — BP 110/58 | HR 87 | Ht 67.0 in | Wt 130.0 lb

## 2014-08-24 DIAGNOSIS — Z954 Presence of other heart-valve replacement: Secondary | ICD-10-CM

## 2014-08-24 DIAGNOSIS — I4891 Unspecified atrial fibrillation: Secondary | ICD-10-CM

## 2014-08-24 DIAGNOSIS — I428 Other cardiomyopathies: Secondary | ICD-10-CM

## 2014-08-24 DIAGNOSIS — Z95810 Presence of automatic (implantable) cardiac defibrillator: Secondary | ICD-10-CM

## 2014-08-24 DIAGNOSIS — I495 Sick sinus syndrome: Secondary | ICD-10-CM

## 2014-08-24 DIAGNOSIS — I472 Ventricular tachycardia: Secondary | ICD-10-CM

## 2014-08-24 DIAGNOSIS — I5022 Chronic systolic (congestive) heart failure: Secondary | ICD-10-CM

## 2014-08-24 DIAGNOSIS — I4729 Other ventricular tachycardia: Secondary | ICD-10-CM

## 2014-08-24 DIAGNOSIS — Z952 Presence of prosthetic heart valve: Secondary | ICD-10-CM

## 2014-08-24 DIAGNOSIS — I4901 Ventricular fibrillation: Secondary | ICD-10-CM

## 2014-08-24 DIAGNOSIS — I429 Cardiomyopathy, unspecified: Secondary | ICD-10-CM

## 2014-08-24 DIAGNOSIS — I5023 Acute on chronic systolic (congestive) heart failure: Secondary | ICD-10-CM

## 2014-08-24 NOTE — Assessment & Plan Note (Signed)
Her ventricular rate is under much better control than it had been previously. No change in medical therapy.

## 2014-08-24 NOTE — Progress Notes (Signed)
HPI Angela [name] returns today for followup. She is a very pleasant 68 year old woman with a nonischemic cardiomyopathy, chronic systolic heart failure, left bundle branch block, ventricular fibrillation cardiac arrest, status post resuscitation, status post biventricular ICD implantation, who has reached elective replacement on her ICD. In the interim, she denies chest pain or shortness of breath. She has very minimal peripheral edema. She still has some encephalopathy from her cardiac arrest, but this is very mild. No Known Allergies   Current Outpatient Prescriptions  Medication Sig Dispense Refill  . carvedilol (COREG) 3.125 MG tablet Take 1 tablet (3.125 mg total) by mouth 2 (two) times daily with a meal. 60 tablet 3  . cholecalciferol (VITAMIN D) 1000 UNITS tablet Take 2,000 Units by mouth daily.    . digoxin (LANOXIN) 0.125 MG tablet Take 0.125 mg by mouth daily.    . furosemide (LASIX) 40 MG tablet Take 40 mg by mouth daily.    . pentoxifylline (TRENTAL) 400 MG CR tablet Take 400 mg by mouth daily as needed. For leg pain.    [name] spironolactone (ALDACTONE) 25 MG tablet TAKE 1/2 TABLET BY MOUTH DAILY 15 tablet 6  . warfarin (COUMADIN) 1 MG tablet Take 8mg  on Monday and Thursday. Two 1mg  tabs and 1 7mg  tab on Monday and Thursday.    . warfarin (COUMADIN) 6 MG tablet Take 1 tablet (6 mg total) by mouth daily. (Patient taking differently: Take one 6mg  tab and 1mg  tab rest of week.  For a total of 7mg .) 30 tablet 1   No current facility-administered medications for this visit.     Past Medical History  Diagnosis Date  . Nonischemic cardiomyopathy     a.  11/2001 - Cath - NL Cors; b. ECHO 09/22/11: EF 20%, MVR normal, moderate LAE, mild RAE c. ECHO (08/2012): ED 20%, diff HK, LA mod dilated, mild/mod TR, RV mild/mod decreased sys fx  . Atrial fibrillation     A.  Chronic Coumadin  . Diverticular disease   . History of colon cancer     A.  1999 - T3, N1  chemotherapy  . Internal  hemorrhoids   . Rheumatic heart disease     A. 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 Sick sinus syndrome     A.  s/p pacer in 1995.  B.    . Cardiac arrest - ventricular fibrillation     A.  12/2007;  B. 01/2008 St. Jude Promote Bi-V ICD placed  . Systolic CHF, chronic     A.  01/2008 Echo - EF 10-20%  . Embolus and thrombosis of iliac artery     A.  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 CVA (cerebral vascular accident)   . DVT (deep venous thrombosis)   . Arthritis   . Memory deficit 08/23/2014  . Abnormality of gait 08/23/2014    ROS:   All systems reviewed and negative except as noted in the HPI.   Past Surgical History  Procedure Laterality Date  . Mitral valve replacement, bjork-shiley valve  1983  . Biventricular aicd    . Colon surgery  1999  . History of echocardiogram  2003, 2007, 2009  . Abdominal hysterectomy    . Av node ablation N/A 09/23/2011    Procedure: AV NODE ABLATION;  Surgeon: Evans Lance, MD;  Location: [facility] CATH LAB;  Service: Cardiovascular;  Laterality: N/A;     Family History  Problem Relation Age of Onset  .  Diabetes Mother 39  . Coronary artery disease Mother     s/p cabg  . Lung cancer Father 53    smoker  . Coronary artery disease Sister     s/p cabg     History   Social History  . Marital Status: Married    Spouse Name: N/A    Number of Children: 1  . Years of Education: N/A   Occupational History  . Retired    Social History Main Topics  . Smoking status: Never Smoker   . Smokeless tobacco: Never Used  . Alcohol Use: No  . Drug Use: No  . Sexual Activity: No   Other Topics Concern  . Not on file   Social History Narrative   The patient is married and has 1 child.  Lives in [place] with husband.  She is retired from working at [employer].  She does not smoke or drink     BP 110/58 mmHg  Pulse 87  Ht 5\' 7"  (1.702 m)  Wt 130 lb (58.968 kg)  BMI 20.36 kg/m2  Physical Exam:  Well appearing NAD HEENT:  Unremarkable Neck:  No JVD, no thyromegally Lymphatics:  No adenopathy Back:  No CVA tenderness Lungs:  Clear, with no wheezes, rales, or rhonchi. HEART:  Regular rate rhythm, no murmurs, no rubs, no clicks, mechanical mitral valve closure. Abd:  soft, positive bowel sounds, no organomegally, no rebound, no guarding Ext:  2 plus pulses, no edema, no cyanosis, no clubbing Skin:  No rashes no nodules Neuro:  CN II through XII intact, motor grossly intact, affect slightly blunted.   DEVICE  Normal device function.  See PaceArt for details.   Assess/Plan:

## 2014-08-24 NOTE — Assessment & Plan Note (Signed)
Her symptoms appear to be class IIb. She will continue her current medical therapy. She is intolerant of ARB or ACE inhibitor secondary to renal insufficiency, and hypotension.

## 2014-08-24 NOTE — Assessment & Plan Note (Signed)
She has had no recent ICD shocks for ventricular fibrillation. She will undergo watchful waiting. No change in medical therapy.

## 2014-08-24 NOTE — Assessment & Plan Note (Signed)
On exam, her valve sounds like it's working normally. To minimize the chance of valve thrombosis, we will not hold her Coumadin prior to ICD generator change.

## 2014-08-24 NOTE — Patient Instructions (Signed)
Your physician recommends that you schedule a follow-up appointment in: 7-10 days from 09/05/14 in device clinic for wound check  See instruction sheet for procedure

## 2014-08-24 NOTE — Assessment & Plan Note (Signed)
Her St. Jude biventricular ICD is working normally but she has reached elective replacement. We'll plan to schedule the patient for removal of her old ICD, and insertion of a new ICD.

## 2014-08-25 LAB — CBC WITH DIFFERENTIAL/PLATELET
Basophils Absolute: 0 10*3/uL (ref 0.0–0.1)
Basophils Relative: 0.6 % (ref 0.0–3.0)
Eosinophils Absolute: 0 10*3/uL (ref 0.0–0.7)
Eosinophils Relative: 1.2 % (ref 0.0–5.0)
HCT: 46.8 % — ABNORMAL HIGH (ref 36.0–46.0)
Hemoglobin: 15.3 g/dL — ABNORMAL HIGH (ref 12.0–15.0)
Lymphocytes Relative: 27.8 % (ref 12.0–46.0)
Lymphs Abs: 1.1 10*3/uL (ref 0.7–4.0)
MCHC: 32.8 g/dL (ref 30.0–36.0)
MCV: 91.2 fl (ref 78.0–100.0)
Monocytes Absolute: 0.3 10*3/uL (ref 0.1–1.0)
Monocytes Relative: 6.8 % (ref 3.0–12.0)
Neutro Abs: 2.6 10*3/uL (ref 1.4–7.7)
Neutrophils Relative %: 63.6 % (ref 43.0–77.0)
Platelets: 178 10*3/uL (ref 150.0–400.0)
RBC: 5.13 Mil/uL — ABNORMAL HIGH (ref 3.87–5.11)
RDW: 16.5 % — ABNORMAL HIGH (ref 11.5–15.5)
WBC: 4.1 10*3/uL (ref 4.0–10.5)

## 2014-08-25 LAB — BASIC METABOLIC PANEL
BUN: 22 mg/dL (ref 6–23)
CO2: 26 mEq/L (ref 19–32)
Calcium: 10 mg/dL (ref 8.4–10.5)
Chloride: 106 mEq/L (ref 96–112)
Creatinine, Ser: 1.2 mg/dL (ref 0.4–1.2)
GFR: 58.73 mL/min — ABNORMAL LOW (ref >60.00)
Glucose, Bld: 81 mg/dL (ref 70–99)
Potassium: 4.5 mEq/L (ref 3.5–5.1)
Sodium: 140 mEq/L (ref 135–145)

## 2014-08-25 LAB — PROTIME-INR
INR: 3.1 ratio — ABNORMAL HIGH (ref 0.8–1.0)
Prothrombin Time: 33.1 s — ABNORMAL HIGH (ref 9.6–13.1)

## 2014-08-26 LAB — TSH: TSH: 1.26 u[IU]/mL (ref 0.450–4.500)

## 2014-08-26 LAB — RPR: RPR: NONREACTIVE

## 2014-08-26 LAB — VITAMIN B12: Vitamin B-12: 504 pg/mL (ref 211–946)

## 2014-08-31 ENCOUNTER — Ambulatory Visit
Admission: RE | Admit: 2014-08-31 | Discharge: 2014-08-31 | Disposition: A | Discharge status: Home / Self Care | Payer: Commercial Managed Care - HMO | Source: Ambulatory Visit | Attending: Neurology | Admitting: Neurology

## 2014-08-31 DIAGNOSIS — R413 Other amnesia: Secondary | ICD-10-CM

## 2014-08-31 DIAGNOSIS — F0390 Unspecified dementia without behavioral disturbance: Secondary | ICD-10-CM

## 2014-08-31 DIAGNOSIS — R269 Unspecified abnormalities of gait and mobility: Secondary | ICD-10-CM

## 2014-09-01 ENCOUNTER — Telehealth: Payer: Self-pay | Admitting: Neurology

## 2014-09-01 MED ORDER — DONEPEZIL HCL 5 MG PO TABS: 5.0000 mg | ORAL_TABLET | Freq: Every day | ORAL | Status: DC

## 2014-09-01 NOTE — Telephone Encounter (Signed)
I called the patient, talk with her husband. The patient does have some mild small vessel disease, nothing that would explain the memory issues. The husband is amenable to getting her on something for memory, I will call in a prescription for Aricept. Blood work was unremarkable.   CT head 09/01/2013:  IMPRESSION:  Abnormal CT head (without) demonstrating: 1. Moderate perisylvian atrophy. Mild ventriculomegaly on ex vacuo basis. 2. Mild periventricular and subcortical chronic small vessel ischemic disease.  3. Compared to CT on 10/26/10, there has been mild progression of atrophy.

## 2014-09-04 DIAGNOSIS — I482 Chronic atrial fibrillation: Secondary | ICD-10-CM | POA: Diagnosis not present

## 2014-09-04 DIAGNOSIS — I5022 Chronic systolic (congestive) heart failure: Secondary | ICD-10-CM | POA: Diagnosis not present

## 2014-09-04 DIAGNOSIS — Z95810 Presence of automatic (implantable) cardiac defibrillator: Secondary | ICD-10-CM | POA: Diagnosis present

## 2014-09-04 DIAGNOSIS — I429 Cardiomyopathy, unspecified: Secondary | ICD-10-CM | POA: Diagnosis not present

## 2014-09-04 DIAGNOSIS — I442 Atrioventricular block, complete: Secondary | ICD-10-CM | POA: Diagnosis not present

## 2014-09-04 MED ORDER — MUPIROCIN 2 % EX OINT
1.0000 "application " | TOPICAL_OINTMENT | Freq: Once | CUTANEOUS | Status: AC
  Administered 2014-09-05: 1 via TOPICAL
  Filled 2014-09-04: qty 22

## 2014-09-05 ENCOUNTER — Encounter (HOSPITAL_COMMUNITY)
Admission: RE | Disposition: A | Discharge status: Home / Self Care | Payer: Self-pay | Source: Ambulatory Visit | Attending: Internal Medicine

## 2014-09-05 ENCOUNTER — Ambulatory Visit (HOSPITAL_COMMUNITY)
Admission: RE | Admit: 2014-09-05 | Discharge: 2014-09-05 | Disposition: A | Discharge status: Home / Self Care | Payer: Medicare HMO | Source: Ambulatory Visit | Attending: Internal Medicine | Admitting: Internal Medicine

## 2014-09-05 ENCOUNTER — Encounter (HOSPITAL_COMMUNITY): Payer: Self-pay | Admitting: *Deleted

## 2014-09-05 DIAGNOSIS — I442 Atrioventricular block, complete: Secondary | ICD-10-CM | POA: Insufficient documentation

## 2014-09-05 DIAGNOSIS — I429 Cardiomyopathy, unspecified: Secondary | ICD-10-CM | POA: Insufficient documentation

## 2014-09-05 DIAGNOSIS — Z95810 Presence of automatic (implantable) cardiac defibrillator: Secondary | ICD-10-CM | POA: Diagnosis not present

## 2014-09-05 DIAGNOSIS — I4901 Ventricular fibrillation: Secondary | ICD-10-CM

## 2014-09-05 DIAGNOSIS — I5022 Chronic systolic (congestive) heart failure: Secondary | ICD-10-CM | POA: Insufficient documentation

## 2014-09-05 DIAGNOSIS — I482 Chronic atrial fibrillation: Secondary | ICD-10-CM | POA: Insufficient documentation

## 2014-09-05 DIAGNOSIS — Z4502 Encounter for adjustment and management of automatic implantable cardiac defibrillator: Secondary | ICD-10-CM

## 2014-09-05 HISTORY — PX: BIV ICD GENERTAOR CHANGE OUT: SHX5745

## 2014-09-05 LAB — PROTIME-INR
INR: 3.39 — ABNORMAL HIGH (ref 0.00–1.49)
Prothrombin Time: 34.6 seconds — ABNORMAL HIGH (ref 11.6–15.2)

## 2014-09-05 LAB — SURGICAL PCR SCREEN
MRSA, PCR: NEGATIVE
Staphylococcus aureus: NEGATIVE

## 2014-09-05 SURGERY — BIV ICD GENERTAOR CHANGE OUT
Anesthesia: LOCAL

## 2014-09-05 MED ORDER — ACETAMINOPHEN 325 MG PO TABS
325.0000 mg | ORAL_TABLET | ORAL | Status: DC | PRN
  Filled 2014-09-05: qty 2

## 2014-09-05 MED ORDER — CHLORHEXIDINE GLUCONATE 4 % EX LIQD
60.0000 mL | Freq: Once | CUTANEOUS | Status: DC
  Filled 2014-09-05: qty 60

## 2014-09-05 MED ORDER — SODIUM CHLORIDE 0.9 % IV SOLN
INTRAVENOUS | Status: DC
  Administered 2014-09-05: 12:00:00 via INTRAVENOUS

## 2014-09-05 MED ORDER — CEFAZOLIN SODIUM-DEXTROSE 2-3 GM-% IV SOLR
INTRAVENOUS | Status: AC
  Filled 2014-09-05: qty 50

## 2014-09-05 MED ORDER — ONDANSETRON HCL 4 MG/2ML IJ SOLN: 4.0000 mg | Freq: Four times a day (QID) | INTRAMUSCULAR | Status: DC | PRN

## 2014-09-05 MED ORDER — CEFAZOLIN SODIUM-DEXTROSE 2-3 GM-% IV SOLR: 2.0000 g | INTRAVENOUS | Status: DC

## 2014-09-05 MED ORDER — LIDOCAINE HCL (PF) 1 % IJ SOLN
INTRAMUSCULAR | Status: AC
Start: 2014-09-05 — End: 2014-09-05
  Filled 2014-09-05: qty 60

## 2014-09-05 MED ORDER — MUPIROCIN 2 % EX OINT
TOPICAL_OINTMENT | CUTANEOUS | Status: AC
  Filled 2014-09-05: qty 22

## 2014-09-05 MED ORDER — SODIUM CHLORIDE 0.9 % IR SOLN
80.0000 mg | Status: DC
  Filled 2014-09-05: qty 2

## 2014-09-05 MED ORDER — FENTANYL CITRATE 0.05 MG/ML IJ SOLN
INTRAMUSCULAR | Status: AC
  Filled 2014-09-05: qty 2

## 2014-09-05 MED ORDER — MIDAZOLAM HCL 5 MG/5ML IJ SOLN
INTRAMUSCULAR | Status: AC
Start: 2014-09-05 — End: 2014-09-05
  Filled 2014-09-05: qty 5

## 2014-09-05 NOTE — H&P (Signed)
  ICD Criteria  Current LVEF:20% ;Obtained > 6 months ago.   NYHA Functional Classification: Class II  Heart Failure History:  Yes, Duration of heart failure since onset is > 9 months  Non-Ischemic Dilated Cardiomyopathy History:  Yes, timeframe is > 9 months  Atrial Fibrillation/Atrial Flutter:  Yes, A-Fib/A-Flutter type: Permanent (>1 year).  Ventricular Tachycardia History:  No.  Cardiac Arrest History:  Yes, This was a Ventricular Tachycardia/Ventricular Fibrillation Arrest. This was NOT a bradycardia arrest.  History of Syndromes with Risk of Sudden Death:  No.  Previous ICD:  Yes, ICD Type:  CRT-D, Reason for ICD:  Secondary, reason for secondary prevention:  Cardiac Arrest.  Electrophysiology Study: No.  Prior MI: No.  PPM: No.  OSA:  No  Patient Life Expectancy of >=1 year: Yes.  Anticoagulation Therapy:  Patient is on anticoagulation therapy, anticoagulation was NOT held prior to procedure.   Beta Blocker Therapy:  Yes.   Ace Inhibitor/ARB Therapy:  No, Reason not on Ace Inhibitor/ARB therapy:  hypotension

## 2014-09-05 NOTE — Discharge Instructions (Signed)
Pacemaker Battery Change, Care After °Refer to this sheet in the next few weeks. These instructions provide you with information on caring for yourself after your procedure. Your health care provider may also give you more specific instructions. Your treatment has been planned according to current medical practices, but problems sometimes occur. Call your health care provider if you have any problems or questions after your procedure. °WHAT TO EXPECT AFTER THE PROCEDURE °After your procedure, it is typical to have the following sensations: °· Soreness at the pacemaker site. °HOME CARE INSTRUCTIONS  °· Keep the incision clean and dry. °· Unless advised otherwise, you may shower beginning 48 hours after your procedure. °· For the first week after the replacement, avoid stretching motions that pull at the incision site, and avoid heavy exercise with the arm that is on the same side as the incision. °· Take medicines only as directed by your health care provider. °· Keep all follow-up visits as directed by your health care provider. °SEEK MEDICAL CARE IF:  °· You have pain at the incision site that is not relieved by over-the-counter or prescription medicine. °· There is drainage or pus from the incision site. °· There is swelling larger than a lime at the incision site. °· You develop red streaking that extends above or below the incision site. °· You feel brief, intermittent palpitations, light-headedness, or any symptoms that you feel might be related to your heart. °SEEK IMMEDIATE MEDICAL CARE IF:  °· You experience chest pain that is different than the pain at the pacemaker site. °· You experience shortness of breath. °· You have palpitations or irregular heartbeat. °· You have light-headedness that does not go away quickly. °· You faint. °· You have pain that gets worse and is not relieved by medicine. °Document Released: 06/02/2013 Document Revised: 12/27/2013 Document Reviewed: 06/02/2013 °[employer]® Patient  Information ©2015 [employer]. This information is not intended to replace advice given to you by your health care provider. Make sure you discuss any questions you have with your health care provider. ° °

## 2014-09-05 NOTE — CV Procedure (Signed)
EP Procedure Note  Procedure: ICD removal and insertion of a new BiV ICD  Pre-procedure diagnosis: s/p VF arrest, s/p rescucitation, chronic systolic heart failure, EF 25%, class 3, chronic atrial fib with complete heart block, s/p ICD with device at [facility].  Post-procedure diagnosis: same as preprocedure diagnosis  Description of the procedure: After informed consent was obtained, the patient was taken to the diagnostic electrophysiology laboratory in the fasting state. After the usual preparation and draping, intravenous Versed and fentanyl were used for sedation. 30 cc of lidocaine was infiltrated in the left infraclavicular region. A 6 cm incision was carried out. Electrocautery was utilized to dissect down to the fascial plane. Electrocautery was utilized to free up the dense fibrous adhesions around the ICD. ICD was removed with gentle traction. The leads were evaluated, and found to be working satisfactorily. RV and LV pacing thresholds, and sensing were all satisfactory. At this point, the new St. Jude biventricular ICD, serial number [id number], was connected to the old leads and placed back in the subcutaneous pocket. The pocket was irrigated with antibiotic irrigation. The incision was closed with 2 layers of Vicryl suture. Benzoin and Steri-Strips her pain on the skin. A pressure dressing was applied. The patient was returned to her room in satisfactory condition.  Complications: There were no immediate procedure complications  Conclusion: Successful removal and insertion of a new biventricular ICD in a patient with an long-standing nonischemic cardiomyopathy, status post resuscitated ventricular fibrillation arrest, chronic systolic heart failure, ejection fraction 25%, and complete heart block with a very slow ventricular escape in the setting of chronic atrial fibrillation.  Cristopher Peru, M.D.

## 2014-09-05 NOTE — Progress Notes (Signed)
Reported INR result of 3.39 to Dr. Lovena Le, no orders received.

## 2014-09-15 ENCOUNTER — Ambulatory Visit (INDEPENDENT_AMBULATORY_CARE_PROVIDER_SITE_OTHER): Payer: Medicare HMO | Admitting: *Deleted

## 2014-09-15 DIAGNOSIS — I429 Cardiomyopathy, unspecified: Secondary | ICD-10-CM | POA: Diagnosis not present

## 2014-09-15 DIAGNOSIS — I428 Other cardiomyopathies: Secondary | ICD-10-CM

## 2014-09-15 DIAGNOSIS — I482 Chronic atrial fibrillation, unspecified: Secondary | ICD-10-CM

## 2014-09-15 LAB — MDC_IDC_ENUM_SESS_TYPE_INCLINIC
Battery Remaining Longevity: 82.8 mo
Brady Statistic RA Percent Paced: 0 %
Brady Statistic RV Percent Paced: 83 %
Date Time Interrogation Session: 20160121100028
HighPow Impedance: 41.6202
Implantable Pulse Generator Serial Number: 7219979
Lead Channel Impedance Value: 312.5 Ohm
Lead Channel Impedance Value: 487.5 Ohm
Lead Channel Pacing Threshold Amplitude: 0.5 V
Lead Channel Pacing Threshold Amplitude: 0.75 V
Lead Channel Pacing Threshold Pulse Width: 0.5 ms
Lead Channel Pacing Threshold Pulse Width: 0.5 ms
Lead Channel Sensing Intrinsic Amplitude: 8 mV
Lead Channel Setting Pacing Amplitude: 2 V
Lead Channel Setting Pacing Amplitude: 2 V
Lead Channel Setting Pacing Pulse Width: 0.5 ms
Lead Channel Setting Pacing Pulse Width: 0.5 ms
Lead Channel Setting Sensing Sensitivity: 0.5 mV
Zone Setting Detection Interval: 270 ms
Zone Setting Detection Interval: 345 ms

## 2014-09-15 NOTE — Progress Notes (Signed)
Wound check appointment. Steri-strips removed. Wound without redness or edema. Incision edges approximated, wound well healed. Normal device function. Thresholds, sensing, and impedances consistent with implant measurements. Histogram distribution appropriate for patient and level of activity. Turned off LV CapConfirm due to not being recommended. LV output set to 2.0 V. No ventricular arrhythmias noted. Patient educated about wound care, arm mobility, lifting restrictions, shock plan. ROV in 3 months with GT.

## 2014-09-20 ENCOUNTER — Other Ambulatory Visit: Payer: Self-pay

## 2014-09-20 DIAGNOSIS — Z1231 Encounter for screening mammogram for malignant neoplasm of breast: Secondary | ICD-10-CM

## 2014-09-26 ENCOUNTER — Other Ambulatory Visit (HOSPITAL_COMMUNITY): Payer: Self-pay | Admitting: Anesthesiology

## 2014-09-29 ENCOUNTER — Ambulatory Visit
Admission: RE | Admit: 2014-09-29 | Discharge: 2014-09-29 | Disposition: A | Discharge status: Home / Self Care | Payer: Medicare HMO | Source: Ambulatory Visit

## 2014-09-29 DIAGNOSIS — Z1231 Encounter for screening mammogram for malignant neoplasm of breast: Secondary | ICD-10-CM

## 2014-10-04 ENCOUNTER — Encounter: Payer: Self-pay | Admitting: Internal Medicine

## 2014-10-07 ENCOUNTER — Other Ambulatory Visit (HOSPITAL_COMMUNITY): Payer: Self-pay | Admitting: Cardiology

## 2014-10-10 ENCOUNTER — Encounter: Payer: Self-pay | Admitting: Internal Medicine

## 2014-10-19 ENCOUNTER — Ambulatory Visit (HOSPITAL_COMMUNITY)
Admission: RE | Admit: 2014-10-19 | Discharge: 2014-10-19 | Disposition: A | Discharge status: Home / Self Care | Payer: Medicare HMO | Source: Ambulatory Visit | Attending: Adult Health | Admitting: Adult Health

## 2014-10-19 ENCOUNTER — Encounter (HOSPITAL_COMMUNITY): Payer: Self-pay

## 2014-10-19 VITALS — BP 108/52 | HR 70 | Resp 18 | Wt 123.2 lb

## 2014-10-19 DIAGNOSIS — Z7901 Long term (current) use of anticoagulants: Secondary | ICD-10-CM | POA: Insufficient documentation

## 2014-10-19 DIAGNOSIS — I480 Paroxysmal atrial fibrillation: Secondary | ICD-10-CM | POA: Diagnosis not present

## 2014-10-19 DIAGNOSIS — F0390 Unspecified dementia without behavioral disturbance: Secondary | ICD-10-CM | POA: Diagnosis not present

## 2014-10-19 DIAGNOSIS — I5022 Chronic systolic (congestive) heart failure: Secondary | ICD-10-CM | POA: Diagnosis not present

## 2014-10-19 NOTE — Patient Instructions (Signed)
Doing great!  Follow up 3 months. We will call you closer to time to make this appointment with you.  Do the following things EVERYDAY: 1) Weigh yourself in the morning before breakfast. Write it down and keep it in a log. 2) Take your medicines as prescribed 3) Eat low salt foods-Limit salt (sodium) to 2000 mg per day.  4) Stay as active as you can everyday 5) Limit all fluids for the day to less than 2 liters

## 2014-10-19 NOTE — Progress Notes (Signed)
Patient ID: Angela [name], female   DOB: [birth year], 69 y.o.   MRN: [id number] Referring Physician: Dr. Cristopher Peru Primary Care: Dr. Lorene Dy Primary Cardiologist: Dr. Cristopher Peru    HPI: Angela [name] is a 69 y.o. female with a hx of chronic systolic CHF in the setting of NICM, SSS, s/p pacemaker with upgrade to a St Jude BiV-ICD after surviving VFib arrest in 2009, rheumatic heart disease, status post mitral valve replacement with a Bjork-Shiley (mechanical) prosthesis, chronic Coumadin therapy, PAF s/p AV nodal ablation, s/p right femoral embolectomy in 2003.   Of note, she had a LHC 4/03: Normal coronary arteries. She was admitted 09/9516 with a/c systolic CHF in the setting of AFib with RVR. Rate control was difficult and she underwent AV nodal ablation.   Admitted to [facility] 84/16/60-63/01/60 with A/C systolic HF.  She was placed on IV lasix with good diuresis.  Stay c/b hypotension therefore carvedilol decreased and losartan stopped.  Discharge weight 139 pounds.   07/31/12: 6MW 243 m and O2 dropped 84% improved to 95% on 2L  Echo 09/14/12: EF 15-20%.  Dilated. Mild to moderate RV dysfunction. MV prosthesis functioning well.   Follow up for Heart Failure: Overall feeling ok. Denies SOB/PND/Orthopnea. Not weighing at home. Following a low salt diet and drinking less than 2L a day. No shocks. Husband managing medications.   Review of Systems: All pertinent positives as in HPI, otherwise negative.  Family Status  Relation Status Death Age  . Mother Alive     CAD s/p CABG, DM2  . Father Deceased     Lung cancer  . Sister Alive   . Brother Alive   . Sister Alive   . Sister Alive   . Sister Alive   . Sister Deceased     Killed  . Brother Alive   . Brother Deceased     MVA  . Brother Deceased     unknown    History   Social History  . Marital Status: Married    Spouse Name: N/A  . Number of Children: 1  . Years of Education: N/A   Occupational History  . Retired     Social History Main Topics  . Smoking status: Never Smoker   . Smokeless tobacco: Never Used  . Alcohol Use: No  . Drug Use: No  . Sexual Activity: No   Other Topics Concern  . Not on file   Social History Narrative   The patient is married and has 1 child.  Lives in [place] with husband.  She is retired from working at [employer].  She does not smoke or drink     Past Medical History  Diagnosis Date  . Nonischemic cardiomyopathy     a.  11/2001 - Cath - NL Cors; b. ECHO 09/22/11: EF 20%, MVR normal, moderate LAE, mild RAE c. ECHO (08/2012): ED 20%, diff HK, LA mod dilated, mild/mod TR, RV mild/mod decreased sys fx  . Atrial fibrillation     A.  Chronic Coumadin  . Diverticular disease   . History of colon cancer     A.  1999 - T3, N1  chemotherapy  . Internal hemorrhoids   . Rheumatic heart disease     A. 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 Sick sinus syndrome     A.  s/p pacer in 1995.  B.    . Cardiac arrest - ventricular fibrillation     A.  12/2007;  B. 01/2008 St.  Jude Promote Bi-V ICD placed  . Systolic CHF, chronic     A.  01/2008 Echo - EF 10-20%  . Embolus and thrombosis of iliac artery     A.  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 CVA (cerebral vascular accident)   . DVT (deep venous thrombosis)   . Arthritis   . Memory deficit 08/23/2014  . Abnormality of gait 08/23/2014    Current Outpatient Prescriptions  Medication Sig Dispense Refill  . carvedilol (COREG) 3.125 MG tablet Take 1 tablet (3.125 mg total) by mouth 2 (two) times daily with a meal. 60 tablet 3  . cholecalciferol (VITAMIN D) 1000 UNITS tablet Take 2,000 Units by mouth daily.    . digoxin (LANOXIN) 0.125 MG tablet Take 0.125 mg by mouth daily.    [name] donepezil (ARICEPT) 5 MG tablet Take 1 tablet (5 mg total) by mouth at bedtime. 30 tablet 1  . furosemide (LASIX) 40 MG tablet TAKE 1 TABLET (40MG ) BY MOUTH DAILY. IF WEIGHT IS GREATER THAN 140 LBS ON HOME SCALE, TAKE 2 TABLETS (80MG ). 60  tablet 0  . pentoxifylline (TRENTAL) 400 MG CR tablet Take 400 mg by mouth daily as needed. For leg pain.    [name] spironolactone (ALDACTONE) 25 MG tablet TAKE ONE-HALF TABLET BY MOUTH DAILY 15 tablet 6  . warfarin (COUMADIN) 6 MG tablet Take 1 tablet (6 mg total) by mouth daily. (Patient taking differently: Take one 6mg  tab and 1mg  tab rest of week.  For a total of 7mg .) 30 tablet 1   No current facility-administered medications for this encounter.    No Known Allergies   Filed Vitals:   10/19/14 1105  BP: 108/52  Pulse: 70  Resp: 18  Weight: 123 lb 4 oz (55.906 kg)  SpO2: 99%   PHYSICAL EXAM: General:  Frail appearing. No respiratory difficulty (husband present)  HEENT: normal Neck: supple. JVP 5-6 arotids 2+ bilat; no bruits. No lymphadenopathy or thryomegaly appreciated. Cor: PMI nondisplaced. Mechanical S1.  Regular rate & irregular rhythm. Soft s3. Lungs: clear Abdomen: soft, nontender, nondistended. No hepatosplenomegaly. No bruits or masses. Good bowel sounds. Extremities: no cyanosis, clubbing, rash,  edema Neuro: alert & oriented x 3, cranial nerves grossly intact. moves all 4 extremities w/o difficulty. Affect pleasant.  ASSESSMENT/PLAN   1. Chronic systolic HF: NICM, EF 02% (08/2012) - NYHA II. Volume status stable. Will continue lasix 40 mg daily.   - Continue low dose coreg 3.125 mg BID, she does not tolerate up titration.  - Continue digoxin 0.125 mg daily and spironolactone 12.5 mg daily.  - Not on ace due to hypotension. - Reinforced the need and importance of daily weights, a low sodium diet, and fluid restriction (less than 2 L a day). Instructed to call the HF clinic if weight increases more than 3 lbs overnight or 5 lbs in a week. .  2.  PAF - Regular rate rhythm. Continue current dose on carvedilol.. On coumadin. INR per PCP. 3. Mild Dementia - Angela [name] is a poor historian, but her husband reports she is doing well. Continue with PCP.   F/U 3 months with  BMET and Dig level.  CLEGG,AMY NP- C 11:07 AM

## 2014-10-20 ENCOUNTER — Telehealth: Payer: Self-pay | Admitting: Cardiology

## 2014-10-20 NOTE — Telephone Encounter (Signed)
LMOVM for pt to return call in regards to ICM clinic.  

## 2014-11-02 ENCOUNTER — Telehealth: Payer: Self-pay | Admitting: Cardiology

## 2014-11-02 NOTE — Telephone Encounter (Signed)
LMOVM for pt to return call in regards to ICM clinic.  

## 2014-11-11 ENCOUNTER — Telehealth: Payer: Self-pay | Admitting: Cardiology

## 2014-11-11 NOTE — Telephone Encounter (Signed)
3rd attempt   LMOVM for pt to return call.  

## 2014-11-19 ENCOUNTER — Encounter: Payer: Self-pay | Admitting: Internal Medicine

## 2014-11-24 ENCOUNTER — Encounter: Payer: Self-pay | Admitting: Internal Medicine

## 2014-12-06 ENCOUNTER — Encounter: Payer: Self-pay | Admitting: Internal Medicine

## 2014-12-06 ENCOUNTER — Ambulatory Visit (INDEPENDENT_AMBULATORY_CARE_PROVIDER_SITE_OTHER): Payer: Medicare HMO | Admitting: Internal Medicine

## 2014-12-06 VITALS — BP 92/60 | HR 73 | Ht 67.0 in | Wt 131.2 lb

## 2014-12-06 DIAGNOSIS — I429 Cardiomyopathy, unspecified: Secondary | ICD-10-CM | POA: Diagnosis not present

## 2014-12-06 DIAGNOSIS — I482 Chronic atrial fibrillation, unspecified: Secondary | ICD-10-CM

## 2014-12-06 DIAGNOSIS — I5022 Chronic systolic (congestive) heart failure: Secondary | ICD-10-CM

## 2014-12-06 DIAGNOSIS — I495 Sick sinus syndrome: Secondary | ICD-10-CM

## 2014-12-06 DIAGNOSIS — I428 Other cardiomyopathies: Secondary | ICD-10-CM

## 2014-12-06 DIAGNOSIS — I4901 Ventricular fibrillation: Secondary | ICD-10-CM

## 2014-12-06 DIAGNOSIS — Z95810 Presence of automatic (implantable) cardiac defibrillator: Secondary | ICD-10-CM

## 2014-12-06 DIAGNOSIS — I5023 Acute on chronic systolic (congestive) heart failure: Secondary | ICD-10-CM

## 2014-12-06 LAB — MDC_IDC_ENUM_SESS_TYPE_INCLINIC
Battery Remaining Longevity: 81.6 mo
Brady Statistic RA Percent Paced: 0 %
Brady Statistic RV Percent Paced: 88 %
Date Time Interrogation Session: 20160412104513
HighPow Impedance: 44.4023
Implantable Pulse Generator Serial Number: 7219979
Lead Channel Impedance Value: 337.5 Ohm
Lead Channel Impedance Value: 475 Ohm
Lead Channel Pacing Threshold Amplitude: 0.5 V
Lead Channel Pacing Threshold Amplitude: 0.5 V
Lead Channel Pacing Threshold Pulse Width: 0.5 ms
Lead Channel Pacing Threshold Pulse Width: 0.5 ms
Lead Channel Sensing Intrinsic Amplitude: 8 mV
Lead Channel Setting Pacing Amplitude: 2 V
Lead Channel Setting Pacing Amplitude: 2 V
Lead Channel Setting Pacing Pulse Width: 0.5 ms
Lead Channel Setting Pacing Pulse Width: 0.5 ms
Lead Channel Setting Sensing Sensitivity: 0.5 mV
Zone Setting Detection Interval: 270 ms
Zone Setting Detection Interval: 345 ms

## 2014-12-06 NOTE — Assessment & Plan Note (Signed)
She has permanent atrial fibrillation. Her ventricular rate is well controlled. She will continue systemic anticoagulation with Coumadin.

## 2014-12-06 NOTE — Patient Instructions (Signed)

## 2014-12-06 NOTE — Assessment & Plan Note (Signed)
She has had no recurrent ventricular arrhythmias, and no ICD therapies. No change in medications.

## 2014-12-06 NOTE — Assessment & Plan Note (Signed)
Her St. Jude biventricular ICD is working normally. We'll plan to recheck in several months.

## 2014-12-06 NOTE — Progress Notes (Signed)
HPI Angela [name] returns today for followup. She is a very pleasant 69 year old woman with a nonischemic cardiomyopathy, chronic systolic heart failure, left bundle branch block, ventricular fibrillation cardiac arrest, status post resuscitation, status post biventricular ICD implantation, who has undergone ICD generator change out. In the interim, she denies chest pain or shortness of breath. She has very minimal peripheral edema. She still has some encephalopathy from her cardiac arrest, but this is very mild. She is fairly sedentary.  No Known Allergies   Current Outpatient Prescriptions  Medication Sig Dispense Refill  . carvedilol (COREG) 3.125 MG tablet Take 1 tablet (3.125 mg total) by mouth 2 (two) times daily with a meal. 60 tablet 3  . cholecalciferol (VITAMIN D) 1000 UNITS tablet Take 2,000 Units by mouth daily.    . digoxin (LANOXIN) 0.125 MG tablet Take 0.125 mg by mouth daily.    . furosemide (LASIX) 40 MG tablet TAKE 1 TABLET (40MG ) BY MOUTH DAILY. IF WEIGHT IS GREATER THAN 140 LBS ON HOME SCALE, TAKE 2 TABLETS (80MG ). (Patient taking differently: TAKE 1/2 TABLET (20MG ) BY MOUTH DAILY. IF WEIGHT IS GREATER THAN 140 LBS ON HOME SCALE, TAKE 2 TABLETS (80MG ).) 60 tablet 0  . pentoxifylline (TRENTAL) 400 MG CR tablet Take 400 mg by mouth daily as needed. For leg pain.    [name] spironolactone (ALDACTONE) 25 MG tablet TAKE ONE-HALF TABLET BY MOUTH DAILY 15 tablet 6  . warfarin (COUMADIN) 6 MG tablet Take 1 tablet (6 mg total) by mouth daily. (Patient taking differently: Take one 6mg  tab and 1mg  tab rest of week.  For a total of 7mg .) 30 tablet 1   No current facility-administered medications for this visit.     Past Medical History  Diagnosis Date  . Nonischemic cardiomyopathy     a.  11/2001 - Cath - NL Cors; b. ECHO 09/22/11: EF 20%, MVR normal, moderate LAE, mild RAE c. ECHO (08/2012): ED 20%, diff HK, LA mod dilated, mild/mod TR, RV mild/mod decreased sys fx  . Atrial  fibrillation     A.  Chronic Coumadin  . Diverticular disease   . History of colon cancer     A.  1999 - T3, N1  chemotherapy  . Internal hemorrhoids   . Rheumatic heart disease     A. 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 Sick sinus syndrome     A.  s/p pacer in 1995.  B.    . Cardiac arrest - ventricular fibrillation     A.  12/2007;  B. 01/2008 St. Jude Promote Bi-V ICD placed  . Systolic CHF, chronic     A.  01/2008 Echo - EF 10-20%  . Embolus and thrombosis of iliac artery     A.  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 CVA (cerebral vascular accident)   . DVT (deep venous thrombosis)   . Arthritis   . Memory deficit 08/23/2014  . Abnormality of gait 08/23/2014    ROS:   All systems reviewed and negative except as noted in the HPI.   Past Surgical History  Procedure Laterality Date  . Mitral valve replacement, bjork-shiley valve  1983  . Biventricular aicd    . Colon surgery  1999  . History of echocardiogram  2003, 2007, 2009  . Abdominal hysterectomy    . Av node ablation N/A 09/23/2011    Procedure: AV NODE ABLATION;  Surgeon: Evans Lance, MD;  Location: [facility] CATH LAB;  Service:  Cardiovascular;  Laterality: N/A;  . Biv icd genertaor change out N/A 09/05/2014    Procedure: BIV ICD GENERTAOR CHANGE OUT;  Surgeon: Evans Lance, MD;  Location[facility] C[facility] LAB;  Service: Cardiovascular;  Laterality: N/A;     Family History  Problem Relation Age of Onset  . Diabetes Mother 65  . Coronary artery disease Mother     s/p cabg  . Lung cancer Father 61    smoker  . Coronary artery disease Sister     s/p cabg     History   Social History  . Marital Status: Married    Spouse Name: N/A  . Number of Children: 1  . Years of Education: N/A   Occupational History  . Retired    Social History Main Topics  . Smoking status: Never Smoker   . Smokeless tobacco: Never Used  . Alcohol Use: No  . Drug Use: No  . Sexual Activity: No   Other Topics Concern  . Not on  file   Social History Narrative   The patient is married and has 1 child.  Lives in [place] with husband.  She is retired from working at [employer].  She does not smoke or drink     BP 92/60 mmHg  Pulse 73  Ht 5\' 7"  (1.702 m)  Wt 131 lb 3.2 oz (59.512 kg)  BMI 20.54 kg/m2  Physical Exam:  Stable appearing 69 year old woman, NAD HEENT: Unremarkable Neck:  7 cm JVD, no thyromegally Back:  No CVA tenderness Lungs:  Clear, with no wheezes, rales, or rhonchi. HEART:  Regular rate rhythm, no murmurs, no rubs, no clicks, mechanical mitral valve closure. Abd:  soft, positive bowel sounds, no organomegally, no rebound, no guarding Ext:  2 plus pulses, no edema, no cyanosis, no clubbing Skin:  No rashes no nodules Neuro:  CN II through XII intact, motor grossly intact, affect slightly blunted.   DEVICE  Normal device function.  See PaceArt for details.   Assess/Plan:

## 2014-12-06 NOTE — Assessment & Plan Note (Signed)
Her symptoms are currently class IIb. She will continue her current medications. She has been fairly sedentary I think more than anything secondary to her dementia. I've encouraged the patient to increase her physical activity.

## 2015-01-19 ENCOUNTER — Other Ambulatory Visit: Payer: Self-pay | Admitting: Internal Medicine

## 2015-02-22 ENCOUNTER — Other Ambulatory Visit (HOSPITAL_COMMUNITY): Payer: Self-pay | Admitting: Internal Medicine

## 2015-02-28 ENCOUNTER — Ambulatory Visit: Payer: Commercial Managed Care - HMO | Admitting: Nurse Practitioner

## 2015-02-28 ENCOUNTER — Encounter: Payer: Self-pay | Admitting: Internal Medicine

## 2015-03-07 ENCOUNTER — Ambulatory Visit (INDEPENDENT_AMBULATORY_CARE_PROVIDER_SITE_OTHER): Payer: Medicare HMO | Admitting: *Deleted

## 2015-03-07 ENCOUNTER — Encounter: Payer: Self-pay | Admitting: Internal Medicine

## 2015-03-07 DIAGNOSIS — I429 Cardiomyopathy, unspecified: Secondary | ICD-10-CM | POA: Diagnosis not present

## 2015-03-07 DIAGNOSIS — I428 Other cardiomyopathies: Secondary | ICD-10-CM

## 2015-03-07 NOTE — Progress Notes (Signed)
Remote ICD transmission.   

## 2015-03-08 ENCOUNTER — Encounter: Payer: Self-pay | Admitting: Internal Medicine

## 2015-03-09 LAB — CUP PACEART REMOTE DEVICE CHECK
Battery Remaining Longevity: 79 mo
Battery Remaining Percentage: 90 %
Battery Voltage: 3.11 V
Date Time Interrogation Session: 20160712075156
HighPow Impedance: 50 Ohm
HighPow Impedance: 51 Ohm
Lead Channel Impedance Value: 350 Ohm
Lead Channel Impedance Value: 540 Ohm
Lead Channel Pacing Threshold Amplitude: 0.5 V
Lead Channel Pacing Threshold Amplitude: 0.625 V
Lead Channel Pacing Threshold Pulse Width: 0.5 ms
Lead Channel Pacing Threshold Pulse Width: 0.5 ms
Lead Channel Sensing Intrinsic Amplitude: 11.8 mV
Lead Channel Setting Pacing Amplitude: 2 V
Lead Channel Setting Pacing Amplitude: 2 V
Lead Channel Setting Pacing Pulse Width: 0.5 ms
Lead Channel Setting Pacing Pulse Width: 0.5 ms
Lead Channel Setting Sensing Sensitivity: 0.5 mV
Pulse Gen Serial Number: 7219979
Zone Setting Detection Interval: 270 ms
Zone Setting Detection Interval: 345 ms

## 2015-03-22 ENCOUNTER — Encounter: Payer: Self-pay | Admitting: *Deleted

## 2015-04-26 ENCOUNTER — Encounter: Payer: Self-pay | Admitting: Internal Medicine

## 2015-05-03 ENCOUNTER — Other Ambulatory Visit (HOSPITAL_COMMUNITY): Payer: Self-pay | Admitting: Adult Health

## 2015-06-08 ENCOUNTER — Ambulatory Visit (INDEPENDENT_AMBULATORY_CARE_PROVIDER_SITE_OTHER): Payer: Medicare HMO | Admitting: *Deleted

## 2015-06-08 DIAGNOSIS — I429 Cardiomyopathy, unspecified: Secondary | ICD-10-CM

## 2015-06-08 DIAGNOSIS — I428 Other cardiomyopathies: Secondary | ICD-10-CM

## 2015-06-08 NOTE — Progress Notes (Signed)
Remote ICD transmission.   

## 2015-06-16 ENCOUNTER — Encounter: Payer: Self-pay | Admitting: Internal Medicine

## 2015-08-31 ENCOUNTER — Other Ambulatory Visit (HOSPITAL_COMMUNITY): Payer: Self-pay | Admitting: Internal Medicine

## 2015-08-31 NOTE — Telephone Encounter (Signed)
okay to fill under Dr Lovena Le

## 2015-08-31 NOTE — Telephone Encounter (Signed)
I routed this request to the chf clinic earlier today with a note that it was originally prescribed from their office. They denied the refill with a reason of prescriber not at this practice. Should this be refilled by the chf clinic or can this be refilled under Dr Lovena Le? Please advise. Thanks, MI

## 2015-09-07 ENCOUNTER — Ambulatory Visit
Admission: RE | Admit: 2015-09-07 | Discharge: 2015-09-07 | Disposition: A | Discharge status: Home / Self Care | Payer: Medicare HMO | Source: Ambulatory Visit | Attending: Internal Medicine | Admitting: Internal Medicine

## 2015-09-07 ENCOUNTER — Ambulatory Visit (INDEPENDENT_AMBULATORY_CARE_PROVIDER_SITE_OTHER): Payer: Medicare HMO | Admitting: *Deleted

## 2015-09-07 ENCOUNTER — Other Ambulatory Visit: Payer: Self-pay | Admitting: Internal Medicine

## 2015-09-07 DIAGNOSIS — R52 Pain, unspecified: Secondary | ICD-10-CM

## 2015-09-07 DIAGNOSIS — I429 Cardiomyopathy, unspecified: Secondary | ICD-10-CM

## 2015-09-07 DIAGNOSIS — I428 Other cardiomyopathies: Secondary | ICD-10-CM

## 2015-09-07 NOTE — Progress Notes (Signed)
Remote ICD transmission.   

## 2015-09-19 LAB — CUP PACEART REMOTE DEVICE CHECK
Battery Remaining Longevity: 73 mo
Battery Remaining Percentage: 84 %
Battery Voltage: 3.02 V
Date Time Interrogation Session: 20170112090015
HighPow Impedance: 44 Ohm
HighPow Impedance: 44 Ohm
Implantable Lead Implant Date: 20090618
Implantable Lead Implant Date: 20090618
Implantable Lead Location: 753858
Implantable Lead Location: 753860
Implantable Lead Model: 1158
Implantable Lead Model: 7120
Lead Channel Impedance Value: 330 Ohm
Lead Channel Impedance Value: 450 Ohm
Lead Channel Pacing Threshold Amplitude: 0.5 V
Lead Channel Pacing Threshold Pulse Width: 0.5 ms
Lead Channel Sensing Intrinsic Amplitude: 12 mV
Lead Channel Setting Pacing Amplitude: 2 V
Lead Channel Setting Pacing Amplitude: 2 V
Lead Channel Setting Pacing Pulse Width: 0.5 ms
Lead Channel Setting Pacing Pulse Width: 0.5 ms
Lead Channel Setting Sensing Sensitivity: 0.5 mV
Pulse Gen Serial Number: 7219979

## 2015-09-22 ENCOUNTER — Encounter: Payer: Self-pay | Admitting: Cardiology

## 2015-10-10 ENCOUNTER — Telehealth: Payer: Self-pay | Admitting: Cardiology

## 2015-10-10 NOTE — Telephone Encounter (Signed)
Spoke w/ pt and requested that she send a manual transmission b/c her home monitor has not updated in at least 8 days.   

## 2015-11-07 ENCOUNTER — Other Ambulatory Visit: Payer: Self-pay

## 2015-11-07 DIAGNOSIS — Z1231 Encounter for screening mammogram for malignant neoplasm of breast: Secondary | ICD-10-CM

## 2015-11-23 ENCOUNTER — Ambulatory Visit
Admission: RE | Admit: 2015-11-23 | Discharge: 2015-11-23 | Disposition: A | Discharge status: Home / Self Care | Payer: Medicare HMO | Source: Ambulatory Visit

## 2015-11-23 DIAGNOSIS — Z1231 Encounter for screening mammogram for malignant neoplasm of breast: Secondary | ICD-10-CM

## 2015-12-25 ENCOUNTER — Other Ambulatory Visit: Payer: Self-pay | Admitting: Internal Medicine

## 2016-01-02 ENCOUNTER — Ambulatory Visit (INDEPENDENT_AMBULATORY_CARE_PROVIDER_SITE_OTHER): Payer: Medicare HMO | Admitting: Internal Medicine

## 2016-01-02 ENCOUNTER — Encounter: Payer: Self-pay | Admitting: Internal Medicine

## 2016-01-02 VITALS — BP 106/74 | HR 77 | Ht 66.0 in | Wt 135.2 lb

## 2016-01-02 DIAGNOSIS — I4729 Other ventricular tachycardia: Secondary | ICD-10-CM

## 2016-01-02 DIAGNOSIS — I429 Cardiomyopathy, unspecified: Secondary | ICD-10-CM

## 2016-01-02 DIAGNOSIS — I428 Other cardiomyopathies: Secondary | ICD-10-CM

## 2016-01-02 DIAGNOSIS — I472 Ventricular tachycardia: Secondary | ICD-10-CM

## 2016-01-02 DIAGNOSIS — I480 Paroxysmal atrial fibrillation: Secondary | ICD-10-CM

## 2016-01-02 DIAGNOSIS — I5022 Chronic systolic (congestive) heart failure: Secondary | ICD-10-CM | POA: Diagnosis not present

## 2016-01-02 DIAGNOSIS — Z95810 Presence of automatic (implantable) cardiac defibrillator: Secondary | ICD-10-CM | POA: Diagnosis not present

## 2016-01-02 LAB — CUP PACEART INCLINIC DEVICE CHECK
Battery Remaining Longevity: 72 mo
Brady Statistic RA Percent Paced: 0 %
Brady Statistic RV Percent Paced: 85 %
Date Time Interrogation Session: 20170509145956
HighPow Impedance: 46.6836
Implantable Lead Implant Date: 20090618
Implantable Lead Implant Date: 20090618
Implantable Lead Location: 753858
Implantable Lead Location: 753860
Implantable Lead Model: 1158
Implantable Lead Model: 7120
Lead Channel Impedance Value: 337.5 Ohm
Lead Channel Impedance Value: 475 Ohm
Lead Channel Pacing Threshold Amplitude: 0.5 V
Lead Channel Pacing Threshold Amplitude: 0.5 V
Lead Channel Pacing Threshold Amplitude: 0.5 V
Lead Channel Pacing Threshold Amplitude: 0.5 V
Lead Channel Pacing Threshold Pulse Width: 0.5 ms
Lead Channel Pacing Threshold Pulse Width: 0.5 ms
Lead Channel Pacing Threshold Pulse Width: 0.5 ms
Lead Channel Pacing Threshold Pulse Width: 0.5 ms
Lead Channel Sensing Intrinsic Amplitude: 12 mV
Lead Channel Setting Pacing Amplitude: 2 V
Lead Channel Setting Pacing Amplitude: 2 V
Lead Channel Setting Pacing Pulse Width: 0.5 ms
Lead Channel Setting Pacing Pulse Width: 0.5 ms
Lead Channel Setting Sensing Sensitivity: 0.5 mV
Pulse Gen Serial Number: 7219979

## 2016-01-02 MED ORDER — SPIRONOLACTONE 25 MG PO TABS: 12.5000 mg | ORAL_TABLET | Freq: Every day | ORAL | Status: DC

## 2016-01-02 NOTE — Progress Notes (Signed)
HPI Angela [name] returns today for followup. She is a very pleasant 70 year old woman with a nonischemic cardiomyopathy, chronic systolic heart failure, left bundle branch block, ventricular fibrillation cardiac arrest, status post resuscitation, status post biventricular ICD implantation, who has undergone ICD generator change out. In the interim, she denies chest pain or shortness of breath. She has very minimal peripheral edema. She still has some encephalopathy from her cardiac arrest, but this is very mild. She is fairly sedentary. Her only complaint is dizziness No Known Allergies   Current Outpatient Prescriptions  Medication Sig Dispense Refill  . carvedilol (COREG) 3.125 MG tablet TAKE ONE (1) TABLET BY MOUTH TWO (2) TIMES DAILY WITH A MEAL 60 tablet 10  . cholecalciferol (VITAMIN D) 1000 UNITS tablet Take 2,000 Units by mouth daily.    . digoxin (LANOXIN) 0.125 MG tablet Take 0.125 mg by mouth daily.    . furosemide (LASIX) 40 MG tablet TAKE ONE (1) TABLET BY MOUTH EVERY DAY. IF WEIGHT IS GREATER THAN 140LBS ON HOME SCALE, TAKE 2 TABLETS (80MG ) 60 tablet 3  . pentoxifylline (TRENTAL) 400 MG CR tablet Take 400 mg by mouth daily as needed. For leg pain.    [name] spironolactone (ALDACTONE) 25 MG tablet TAKE 1/2 TABLET BY MOUTH DAILY 15 tablet 0  . warfarin (COUMADIN) 6 MG tablet Take 6 mg by mouth as directed.     No current facility-administered medications for this visit.     Past Medical History  Diagnosis Date  . Nonischemic cardiomyopathy ([place])     a.  11/2001 - Cath - NL Cors; b. ECHO 09/22/11: EF 20%, MVR normal, moderate LAE, mild RAE c. ECHO (08/2012): ED 20%, diff HK, LA mod dilated, mild/mod TR, RV mild/mod decreased sys fx  . Atrial fibrillation ([place])     A.  Chronic Coumadin  . Diverticular disease   . History of colon cancer     A.  1999 - T3, N1  chemotherapy  . Internal hemorrhoids   . Rheumatic heart disease     A. 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 Sick sinus  syndrome ([place])     A.  s/p pacer in 1995.  B.    . Cardiac arrest - ventricular fibrillation     A.  12/2007;  B. 01/2008 St. Jude Promote Bi-V ICD placed  . Systolic CHF, chronic ([place])     A.  01/2008 Echo - EF 10-20%  . Embolus and thrombosis of iliac artery ([place])     A.  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 CVA (cerebral vascular accident) ([place])   . DVT (deep venous thrombosis) ([place])   . Arthritis   . Memory deficit 08/23/2014  . Abnormality of gait 08/23/2014    ROS:   All systems reviewed and negative except as noted in the HPI.   Past Surgical History  Procedure Laterality Date  . Mitral valve replacement, bjork-shiley valve  1983  . Biventricular aicd    . Colon surgery  1999  . History of echocardiogram  2003, 2007, 2009  . Abdominal hysterectomy    . Av node ablation N/A 09/23/2011    Procedure: AV NODE ABLATION;  Surgeon: Evans Lance, MD;  Location: [facility] CATH LAB;  Service: Cardiovascular;  Laterality: N/A;  . Biv icd genertaor change out N/A 09/05/2014    Procedure: BIV ICD GENERTAOR CHANGE OUT;  Surgeon: Evans Lance, MD;  Location[facility] CATH LAB;  Service: Cardiovascular;  Laterality: N/A;     Family History  Problem Relation Age of Onset  . Diabetes Mother 74  . Coronary artery disease Mother     s/p cabg  . Lung cancer Father 40    smoker  . Coronary artery disease Sister     s/p cabg     Social History   Social History  . Marital Status: Married    Spouse Name: N/A  . Number of Children: 1  . Years of Education: N/A   Occupational History  . Retired    Social History Main Topics  . Smoking status: Never Smoker   . Smokeless tobacco: Never Used  . Alcohol Use: No  . Drug Use: No  . Sexual Activity: No   Other Topics Concern  . Not on file   Social History Narrative   The patient is married and has 1 child.  Lives in [place] with husband.  She is retired from working at [employer]  She does not smoke or drink      BP 106/74 mmHg  Pulse 77  Ht 5\' 6"  (1.676 m)  Wt 135 lb 3.2 oz (61.326 kg)  BMI 21.83 kg/m2  Physical Exam:  Stable appearing 70 year old woman, NAD HEENT: Unremarkable Neck:  7 cm JVD, no thyromegally Back:  No CVA tenderness Lungs:  Clear, with no wheezes, rales, or rhonchi. HEART:  Regular rate rhythm, no murmurs, no rubs, no clicks, mechanical mitral valve closure. Abd:  soft, positive bowel sounds, no organomegally, no rebound, no guarding Ext:  2 plus pulses, no edema, no cyanosis, no clubbing Skin:  No rashes no nodules Neuro:  CN II through XII intact, motor grossly intact, affect slightly blunted.   DEVICE  Normal device function.  See PaceArt for details.   Assess/Plan: 1. VF - she has had no recurrent episodes and no ICD shocks 2. VT - she has had some NSVT which may be contributing to her dizziness. 3. ICD - her St. Jude BiV ICD is working normally. Will follow. 4. Chronic systolic heart failure - she is class 2B. She will continue her current meds and maintain a low sodium diet.  Mikle Bosworth.D.

## 2016-01-02 NOTE — Patient Instructions (Signed)
Medication Instructions:  Your physician recommends that you continue on your current medications as directed. Please refer to the Current Medication list given to you today.   Labwork: None ordered   Testing/Procedures: None ordered   Follow-Up:  Your physician wants you to follow-up in: 12 months with Dr Knox Saliva will receive a reminder letter in the mail two months in advance. If you don't receive a letter, please call our office to schedule the follow-up appointment.  Remote monitoring is used to monitor your  ICD from home. This monitoring reduces the number of office visits required to check your device to one time per year. It allows [place] to keep an eye on the functioning of your device to ensure it is working properly. You are scheduled for a device check from home on 04/02/16. You may send your transmission at any time that day. If you have a wireless device, the transmission will be sent automatically. After your physician reviews your transmission, you will receive a postcard with your next transmission date.    Any Other Special Instructions Will Be Listed Below (If Applicable).     If you need a refill on your cardiac medications before your next appointment, please call your pharmacy.

## 2016-02-02 LAB — CUP PACEART REMOTE DEVICE CHECK
Battery Remaining Longevity: 76 mo
Battery Remaining Percentage: 87 %
Battery Voltage: 3.05 V
Date Time Interrogation Session: 20161013123844
HighPow Impedance: 52 Ohm
HighPow Impedance: 52 Ohm
Implantable Lead Implant Date: 20090618
Implantable Lead Implant Date: 20090618
Implantable Lead Location: 753858
Implantable Lead Location: 753860
Implantable Lead Model: 1158
Implantable Lead Model: 7120
Lead Channel Impedance Value: 340 Ohm
Lead Channel Impedance Value: 460 Ohm
Lead Channel Pacing Threshold Amplitude: 0.5 V
Lead Channel Pacing Threshold Amplitude: 0.5 V
Lead Channel Pacing Threshold Pulse Width: 0.5 ms
Lead Channel Pacing Threshold Pulse Width: 0.5 ms
Lead Channel Sensing Intrinsic Amplitude: 11.8 mV
Lead Channel Setting Pacing Amplitude: 2 V
Lead Channel Setting Pacing Amplitude: 2 V
Lead Channel Setting Pacing Pulse Width: 0.5 ms
Lead Channel Setting Pacing Pulse Width: 0.5 ms
Lead Channel Setting Sensing Sensitivity: 0.5 mV
Pulse Gen Serial Number: 7219979

## 2016-02-28 ENCOUNTER — Other Ambulatory Visit (HOSPITAL_COMMUNITY): Payer: Self-pay | Admitting: *Deleted

## 2016-02-28 MED ORDER — CARVEDILOL 3.125 MG PO TABS: ORAL_TABLET | ORAL | Status: DC

## 2016-04-02 ENCOUNTER — Ambulatory Visit (INDEPENDENT_AMBULATORY_CARE_PROVIDER_SITE_OTHER): Payer: Medicare HMO | Admitting: *Deleted

## 2016-04-02 DIAGNOSIS — I4901 Ventricular fibrillation: Secondary | ICD-10-CM

## 2016-04-02 DIAGNOSIS — Z95810 Presence of automatic (implantable) cardiac defibrillator: Secondary | ICD-10-CM | POA: Diagnosis not present

## 2016-04-02 DIAGNOSIS — I5022 Chronic systolic (congestive) heart failure: Secondary | ICD-10-CM

## 2016-04-02 NOTE — Progress Notes (Signed)
Remote ICD transmission.   

## 2016-04-03 ENCOUNTER — Encounter: Payer: Self-pay | Admitting: Cardiology

## 2016-04-09 LAB — CUP PACEART REMOTE DEVICE CHECK
Battery Remaining Longevity: 68 mo
Battery Remaining Percentage: 78 %
Battery Voltage: 2.99 V
Date Time Interrogation Session: 20170808060018
HighPow Impedance: 50 Ohm
HighPow Impedance: 50 Ohm
Implantable Lead Implant Date: 20090618
Implantable Lead Implant Date: 20090618
Implantable Lead Location: 753858
Implantable Lead Location: 753860
Implantable Lead Model: 7120
Lead Channel Impedance Value: 350 Ohm
Lead Channel Impedance Value: 490 Ohm
Lead Channel Pacing Threshold Amplitude: 0.5 V
Lead Channel Pacing Threshold Amplitude: 0.5 V
Lead Channel Pacing Threshold Pulse Width: 0.5 ms
Lead Channel Pacing Threshold Pulse Width: 0.5 ms
Lead Channel Sensing Intrinsic Amplitude: 12 mV
Lead Channel Setting Pacing Amplitude: 2 V
Lead Channel Setting Pacing Amplitude: 2 V
Lead Channel Setting Pacing Pulse Width: 0.5 ms
Lead Channel Setting Pacing Pulse Width: 0.5 ms
Lead Channel Setting Sensing Sensitivity: 0.5 mV
Pulse Gen Serial Number: 7219979

## 2016-06-26 ENCOUNTER — Encounter: Payer: Self-pay | Admitting: Internal Medicine

## 2016-06-26 ENCOUNTER — Telehealth: Payer: Self-pay

## 2016-06-26 NOTE — Telephone Encounter (Signed)
LVM with patient to discuss ATP episode.

## 2016-06-28 NOTE — Telephone Encounter (Signed)
Pt husband returned your call. Please call back at listed number in epic.

## 2016-06-28 NOTE — Telephone Encounter (Signed)
Spoke with patient regarding ATP episode. She states that her sister died on that day but that she had no symptoms at the time of the event. She stated she has been taking all medication as prescribed. I informed her that I would review episode with Dr. Lovena Le and call her back if there were any recommended changes. I informed her of the driving restrictions-she verbalized understanding.

## 2016-06-28 NOTE — Telephone Encounter (Signed)
Reviewed with GT. Will continue to monitor.

## 2016-07-02 ENCOUNTER — Ambulatory Visit (INDEPENDENT_AMBULATORY_CARE_PROVIDER_SITE_OTHER): Payer: Medicare HMO | Admitting: *Deleted

## 2016-07-02 DIAGNOSIS — I5022 Chronic systolic (congestive) heart failure: Secondary | ICD-10-CM

## 2016-07-02 DIAGNOSIS — I4901 Ventricular fibrillation: Secondary | ICD-10-CM

## 2016-07-02 NOTE — Progress Notes (Signed)
Remote ICD transmission.   

## 2016-07-03 ENCOUNTER — Encounter: Payer: Self-pay | Admitting: Cardiology

## 2016-07-30 LAB — CUP PACEART REMOTE DEVICE CHECK
Battery Remaining Longevity: 65 mo
Battery Remaining Percentage: 75 %
Battery Voltage: 2.99 V
Date Time Interrogation Session: 20171107070017
HighPow Impedance: 49 Ohm
HighPow Impedance: 49 Ohm
Implantable Lead Implant Date: 20090618
Implantable Lead Implant Date: 20090618
Implantable Lead Location: 753858
Implantable Lead Location: 753860
Implantable Lead Model: 7120
Implantable Pulse Generator Implant Date: 20160111
Lead Channel Impedance Value: 340 Ohm
Lead Channel Impedance Value: 540 Ohm
Lead Channel Pacing Threshold Amplitude: 0.5 V
Lead Channel Pacing Threshold Amplitude: 0.5 V
Lead Channel Pacing Threshold Pulse Width: 0.5 ms
Lead Channel Pacing Threshold Pulse Width: 0.5 ms
Lead Channel Sensing Intrinsic Amplitude: 12 mV
Lead Channel Setting Pacing Amplitude: 2 V
Lead Channel Setting Pacing Amplitude: 2 V
Lead Channel Setting Pacing Pulse Width: 0.5 ms
Lead Channel Setting Pacing Pulse Width: 0.5 ms
Lead Channel Setting Sensing Sensitivity: 0.5 mV
Pulse Gen Serial Number: 7219979

## 2016-08-06 ENCOUNTER — Other Ambulatory Visit: Payer: Self-pay | Admitting: Internal Medicine

## 2016-08-06 DIAGNOSIS — E2839 Other primary ovarian failure: Secondary | ICD-10-CM

## 2016-08-13 ENCOUNTER — Ambulatory Visit
Admission: RE | Admit: 2016-08-13 | Discharge: 2016-08-13 | Disposition: A | Discharge status: Home / Self Care | Payer: Medicare HMO | Source: Ambulatory Visit | Attending: Internal Medicine | Admitting: Internal Medicine

## 2016-08-13 DIAGNOSIS — E2839 Other primary ovarian failure: Secondary | ICD-10-CM

## 2016-08-27 ENCOUNTER — Other Ambulatory Visit (HOSPITAL_COMMUNITY): Payer: Self-pay | Admitting: *Deleted

## 2016-09-03 ENCOUNTER — Other Ambulatory Visit: Payer: Self-pay | Admitting: *Deleted

## 2016-09-03 MED ORDER — SPIRONOLACTONE 25 MG PO TABS: 12.5000 mg | ORAL_TABLET | Freq: Every day | ORAL | 0 refills | Status: DC

## 2016-10-01 ENCOUNTER — Ambulatory Visit (INDEPENDENT_AMBULATORY_CARE_PROVIDER_SITE_OTHER): Payer: Medicare HMO | Admitting: *Deleted

## 2016-10-01 DIAGNOSIS — I428 Other cardiomyopathies: Secondary | ICD-10-CM | POA: Diagnosis not present

## 2016-10-01 DIAGNOSIS — I5022 Chronic systolic (congestive) heart failure: Secondary | ICD-10-CM

## 2016-10-01 NOTE — Progress Notes (Signed)
Remote ICD transmission.   

## 2016-10-04 ENCOUNTER — Encounter: Payer: Self-pay | Admitting: Cardiology

## 2016-10-04 LAB — CUP PACEART REMOTE DEVICE CHECK
Battery Remaining Longevity: 62 mo
Battery Remaining Percentage: 72 %
Battery Voltage: 2.99 V
Date Time Interrogation Session: 20180206070021
HighPow Impedance: 50 Ohm
HighPow Impedance: 50 Ohm
Implantable Lead Implant Date: 20090618
Implantable Lead Implant Date: 20090618
Implantable Lead Location: 753858
Implantable Lead Location: 753860
Implantable Lead Model: 7120
Implantable Pulse Generator Implant Date: 20160111
Lead Channel Impedance Value: 340 Ohm
Lead Channel Impedance Value: 480 Ohm
Lead Channel Pacing Threshold Amplitude: 0.5 V
Lead Channel Pacing Threshold Amplitude: 0.5 V
Lead Channel Pacing Threshold Pulse Width: 0.5 ms
Lead Channel Pacing Threshold Pulse Width: 0.5 ms
Lead Channel Sensing Intrinsic Amplitude: 12 mV
Lead Channel Setting Pacing Amplitude: 2 V
Lead Channel Setting Pacing Amplitude: 2 V
Lead Channel Setting Pacing Pulse Width: 0.5 ms
Lead Channel Setting Pacing Pulse Width: 0.5 ms
Lead Channel Setting Sensing Sensitivity: 0.5 mV
Pulse Gen Serial Number: 7219979

## 2016-11-07 ENCOUNTER — Other Ambulatory Visit: Payer: Self-pay | Admitting: Internal Medicine

## 2016-11-07 DIAGNOSIS — Z1231 Encounter for screening mammogram for malignant neoplasm of breast: Secondary | ICD-10-CM

## 2016-11-27 ENCOUNTER — Ambulatory Visit
Admission: RE | Admit: 2016-11-27 | Discharge: 2016-11-27 | Disposition: A | Discharge status: Home / Self Care | Payer: Medicare HMO | Source: Ambulatory Visit | Attending: Internal Medicine | Admitting: Internal Medicine

## 2016-11-27 DIAGNOSIS — Z1231 Encounter for screening mammogram for malignant neoplasm of breast: Secondary | ICD-10-CM

## 2016-12-10 ENCOUNTER — Other Ambulatory Visit: Payer: Self-pay | Admitting: Internal Medicine

## 2017-02-03 ENCOUNTER — Ambulatory Visit (INDEPENDENT_AMBULATORY_CARE_PROVIDER_SITE_OTHER): Payer: Medicare HMO | Admitting: Internal Medicine

## 2017-02-03 ENCOUNTER — Encounter: Payer: Self-pay | Admitting: Internal Medicine

## 2017-02-03 DIAGNOSIS — I428 Other cardiomyopathies: Secondary | ICD-10-CM | POA: Diagnosis not present

## 2017-02-03 DIAGNOSIS — I472 Ventricular tachycardia: Secondary | ICD-10-CM

## 2017-02-03 DIAGNOSIS — I5022 Chronic systolic (congestive) heart failure: Secondary | ICD-10-CM | POA: Diagnosis not present

## 2017-02-03 DIAGNOSIS — I5023 Acute on chronic systolic (congestive) heart failure: Secondary | ICD-10-CM | POA: Diagnosis not present

## 2017-02-03 DIAGNOSIS — I4729 Other ventricular tachycardia: Secondary | ICD-10-CM

## 2017-02-03 MED ORDER — FUROSEMIDE 40 MG PO TABS: ORAL_TABLET | ORAL | 3 refills | Status: DC

## 2017-02-03 NOTE — Progress Notes (Signed)
HPI Angela [name] returns today for followup. She is a very pleasant 71 year old woman with a non-ischemic cardiomyopathy, chronic systolic heart failure, left bundle branch block, ventricular fibrillation cardiac arrest, status post resuscitation, status post biventricular ICD implantation.  In the interim, she has not been in the hospital. She does have some swelling in her legs and at times misses her lasix. She is fairly sedentary.   No Known Allergies   Current Outpatient Prescriptions  Medication Sig Dispense Refill  . carvedilol (COREG) 3.125 MG tablet TAKE ONE (1) TABLET BY MOUTH TWO (2) TIMES DAILY WITH A MEAL 60 tablet 10  . cholecalciferol (VITAMIN D) 1000 UNITS tablet Take 2,000 Units by mouth daily.    . digoxin (LANOXIN) 0.125 MG tablet Take 0.125 mg by mouth daily.    . furosemide (LASIX) 40 MG tablet TAKE ONE (1) TABLET BY MOUTH EVERY DAY. IF WEIGHT IS GREATER THAN 140LBS ON HOME SCALE, TAKE 2 TABLETS (80MG ) 60 tablet 3  . pentoxifylline (TRENTAL) 400 MG CR tablet Take 400 mg by mouth daily as needed. For leg pain.    [name] spironolactone (ALDACTONE) 25 MG tablet TAKE ONE-HALF TABLET (12.5MG ) BY MOUTH ONCE DAILY 45 tablet 0  . warfarin (COUMADIN) 6 MG tablet Take 6 mg by mouth as directed.     No current facility-administered medications for this visit.      Past Medical History:  Diagnosis Date  . Abnormality of gait 08/23/2014  . Arthritis   . Atrial fibrillation ([place])    A.  Chronic Coumadin  . Cardiac arrest - ventricular fibrillation    A.  12/2007;  B. 01/2008 St. Jude Promote Bi-V ICD placed  . CVA (cerebral vascular accident) ([place])   . Diverticular disease   . DVT (deep venous thrombosis) ([place])   . 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 History of colon cancer    A.  1999 - T3, N1  chemotherapy  . Internal hemorrhoids   . Memory deficit 08/23/2014  . Nonischemic cardiomyopathy ([place])    a.   11/2001 - Cath - NL Cors; b. ECHO 09/22/11: EF 20%, MVR normal, moderate LAE, mild RAE c. ECHO (08/2012): ED 20%, diff HK, LA mod dilated, mild/mod TR, RV mild/mod decreased sys fx  . Rheumatic heart disease    A. 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 Sick sinus syndrome ([place])    A.  s/p pacer in 1995.  B.    . Systolic CHF, chronic ([place])    A.  01/2008 Echo - EF 10-20%    ROS:   All systems reviewed and negative except as noted in the HPI.   Past Surgical History:  Procedure Laterality Date  . ABDOMINAL HYSTERECTOMY    . AV NODE ABLATION N/A 09/23/2011   Procedure: AV NODE ABLATION;  Surgeon: Evans Lance, MD;  Location: [facility] CATH LAB;  Service: Cardiovascular;  Laterality: N/A;  . BIV ICD GENERTAOR CHANGE OUT N/A 09/05/2014   Procedure: BIV ICD GENERTAOR CHANGE OUT;  Surgeon: Evans Lance, MD;  Location: [facility] CATH LAB;  Service: Cardiovascular;  Laterality: N/A;  . Biventricular AICD    . COLON SURGERY  1999  . History of echocardiogram  2003, 2007, 2009  . Mitral Valve Replacement, Bjork-Shiley valve  1983     Family History  Problem Relation Age of Onset  . Diabetes Mother 75  . Coronary artery  disease Mother        s/p cabg  . Lung cancer Father 59       smoker  . Coronary artery disease Sister        s/p cabg     Social History   Social History  . Marital status: Married    Spouse name: N/A  . Number of children: 1  . Years of education: N/A   Occupational History  . Retired Retired   Social History Main Topics  . Smoking status: Never Smoker  . Smokeless tobacco: Never Used  . Alcohol use No  . Drug use: No  . Sexual activity: No   Other Topics Concern  . Not on file   Social History Narrative   The patient is married and has 1 child.  Lives in [place] with husband.  She is retired from working at [employer].  She does not smoke or drink     There were no vitals taken for this visit.  Physical Exam:  Stable appearing 71 year old woman, NAD HEENT:  Unremarkable Neck:  7 cm JVD, no thyromegally Back:  No CVA tenderness Lungs:  Clear, with no wheezes, rales, or rhonchi. HEART:  Regular rate rhythm, no murmurs, no rubs, no clicks, mechanical mitral valve closure. Abd:  soft, positive bowel sounds, no organomegally, no rebound, no guarding Ext:  2 plus pulses, 1+ edema, no cyanosis, no clubbing Skin:  No rashes no nodules Neuro:  CN II through XII intact, motor grossly intact, affect slightly blunted.   DEVICE  Normal device function.  See PaceArt for details.   Assess/Plan: 1. VF/VT - she has had no recurrent episodes and no ICD shocks. She does have some NSVT (polymorphic) 2. ICD - her St. Jude BiV ICD is working normally. Will follow. 3. Chronic systolic heart failure - she is class 2B. She will continue her current meds and maintain a low sodium diet. She has had some non-compliance and I have discussed the importance of taking her lasix with her husband. 4. Atrial fib - this is a chronic problem and her rates are well controlled. Will follow.  Mikle Bosworth.D.

## 2017-02-03 NOTE — Patient Instructions (Signed)
Medication Instructions:  Lasix - TAKE ONE (1) TABLET BY MOUTH EVERY DAY. IF WEIGHT IS GREATER THAN 140LBS ON HOME SCALE, TAKE 2 TABLETS (80MG )    Labwork: None Ordered   Testing/Procedures: None Ordered   Follow-Up: Your physician wants you to follow-up in: 1 year with Dr. Lovena Le. You will receive a reminder letter in the mail two months in advance. If you don't receive a letter, please call our office to schedule the follow-up appointment.  Remote monitoring is used to monitor your ICD from home. This monitoring reduces the number of office visits required to check your device to one time per year. It allows [place] to keep an eye on the functioning of your device to ensure it is working properly. You are scheduled for a device check from home on  05/05/17 . You may send your transmission at any time that day. If you have a wireless device, the transmission will be sent automatically. After your physician reviews your transmission, you will receive a postcard with your next transmission date.    Any Other Special Instructions Will Be Listed Below (If Applicable).     If you need a refill on your cardiac medications before your next appointment, please call your pharmacy.

## 2017-02-04 LAB — CUP PACEART INCLINIC DEVICE CHECK
Battery Remaining Longevity: 58 mo
Brady Statistic RA Percent Paced: 0 %
Brady Statistic RV Percent Paced: 90 %
Date Time Interrogation Session: 20180611140549
HighPow Impedance: 45.2654
Implantable Lead Implant Date: 20090618
Implantable Lead Implant Date: 20090618
Implantable Lead Location: 753858
Implantable Lead Location: 753860
Implantable Lead Model: 7120
Implantable Pulse Generator Implant Date: 20160111
Lead Channel Impedance Value: 325 Ohm
Lead Channel Impedance Value: 450 Ohm
Lead Channel Pacing Threshold Amplitude: 0.5 V
Lead Channel Pacing Threshold Amplitude: 0.5 V
Lead Channel Pacing Threshold Amplitude: 0.75 V
Lead Channel Pacing Threshold Amplitude: 0.75 V
Lead Channel Pacing Threshold Pulse Width: 0.5 ms
Lead Channel Pacing Threshold Pulse Width: 0.5 ms
Lead Channel Pacing Threshold Pulse Width: 0.5 ms
Lead Channel Pacing Threshold Pulse Width: 0.5 ms
Lead Channel Sensing Intrinsic Amplitude: 8.4 mV
Lead Channel Setting Pacing Amplitude: 2 V
Lead Channel Setting Pacing Amplitude: 2 V
Lead Channel Setting Pacing Pulse Width: 0.5 ms
Lead Channel Setting Pacing Pulse Width: 0.5 ms
Lead Channel Setting Sensing Sensitivity: 0.5 mV
Pulse Gen Serial Number: 7219979

## 2017-02-27 ENCOUNTER — Other Ambulatory Visit (HOSPITAL_COMMUNITY): Payer: Self-pay | Admitting: Adult Health

## 2017-03-03 NOTE — Telephone Encounter (Signed)
Followed by taylor

## 2017-03-05 ENCOUNTER — Other Ambulatory Visit: Payer: Self-pay | Admitting: Internal Medicine

## 2017-04-28 ENCOUNTER — Inpatient Hospital Stay (HOSPITAL_COMMUNITY)
Admission: EM | Admit: 2017-04-28 | Discharge: 2017-05-08 | DRG: 378 | Disposition: A | Discharge status: Home Health | Payer: Medicare HMO | Attending: Family Medicine | Admitting: Family Medicine

## 2017-04-28 ENCOUNTER — Encounter (HOSPITAL_COMMUNITY): Payer: Self-pay | Admitting: Emergency Medicine

## 2017-04-28 DIAGNOSIS — I959 Hypotension, unspecified: Secondary | ICD-10-CM | POA: Diagnosis present

## 2017-04-28 DIAGNOSIS — R791 Abnormal coagulation profile: Secondary | ICD-10-CM | POA: Diagnosis present

## 2017-04-28 DIAGNOSIS — I495 Sick sinus syndrome: Secondary | ICD-10-CM | POA: Diagnosis present

## 2017-04-28 DIAGNOSIS — Z950 Presence of cardiac pacemaker: Secondary | ICD-10-CM

## 2017-04-28 DIAGNOSIS — I428 Other cardiomyopathies: Secondary | ICD-10-CM | POA: Diagnosis not present

## 2017-04-28 DIAGNOSIS — Z90710 Acquired absence of both cervix and uterus: Secondary | ICD-10-CM

## 2017-04-28 DIAGNOSIS — Z86718 Personal history of other venous thrombosis and embolism: Secondary | ICD-10-CM

## 2017-04-28 DIAGNOSIS — N179 Acute kidney failure, unspecified: Secondary | ICD-10-CM | POA: Diagnosis present

## 2017-04-28 DIAGNOSIS — M810 Age-related osteoporosis without current pathological fracture: Secondary | ICD-10-CM | POA: Diagnosis present

## 2017-04-28 DIAGNOSIS — K625 Hemorrhage of anus and rectum: Secondary | ICD-10-CM | POA: Diagnosis not present

## 2017-04-28 DIAGNOSIS — Z801 Family history of malignant neoplasm of trachea, bronchus and lung: Secondary | ICD-10-CM | POA: Diagnosis not present

## 2017-04-28 DIAGNOSIS — Z515 Encounter for palliative care: Secondary | ICD-10-CM | POA: Diagnosis not present

## 2017-04-28 DIAGNOSIS — D696 Thrombocytopenia, unspecified: Secondary | ICD-10-CM | POA: Diagnosis present

## 2017-04-28 DIAGNOSIS — F0390 Unspecified dementia without behavioral disturbance: Secondary | ICD-10-CM | POA: Diagnosis present

## 2017-04-28 DIAGNOSIS — I480 Paroxysmal atrial fibrillation: Secondary | ICD-10-CM | POA: Diagnosis not present

## 2017-04-28 DIAGNOSIS — I4901 Ventricular fibrillation: Secondary | ICD-10-CM | POA: Diagnosis not present

## 2017-04-28 DIAGNOSIS — D62 Acute posthemorrhagic anemia: Secondary | ICD-10-CM | POA: Diagnosis present

## 2017-04-28 DIAGNOSIS — D649 Anemia, unspecified: Secondary | ICD-10-CM | POA: Diagnosis not present

## 2017-04-28 DIAGNOSIS — Z85038 Personal history of other malignant neoplasm of large intestine: Secondary | ICD-10-CM | POA: Diagnosis not present

## 2017-04-28 DIAGNOSIS — K5731 Diverticulosis of large intestine without perforation or abscess with bleeding: Principal | ICD-10-CM | POA: Diagnosis present

## 2017-04-28 DIAGNOSIS — I5022 Chronic systolic (congestive) heart failure: Secondary | ICD-10-CM | POA: Diagnosis present

## 2017-04-28 DIAGNOSIS — Z8249 Family history of ischemic heart disease and other diseases of the circulatory system: Secondary | ICD-10-CM | POA: Diagnosis not present

## 2017-04-28 DIAGNOSIS — Z952 Presence of prosthetic heart valve: Secondary | ICD-10-CM | POA: Diagnosis not present

## 2017-04-28 DIAGNOSIS — I4891 Unspecified atrial fibrillation: Secondary | ICD-10-CM | POA: Diagnosis present

## 2017-04-28 DIAGNOSIS — K922 Gastrointestinal hemorrhage, unspecified: Secondary | ICD-10-CM | POA: Diagnosis present

## 2017-04-28 DIAGNOSIS — I5042 Chronic combined systolic (congestive) and diastolic (congestive) heart failure: Secondary | ICD-10-CM | POA: Diagnosis present

## 2017-04-28 DIAGNOSIS — I429 Cardiomyopathy, unspecified: Secondary | ICD-10-CM | POA: Diagnosis present

## 2017-04-28 DIAGNOSIS — Z8673 Personal history of transient ischemic attack (TIA), and cerebral infarction without residual deficits: Secondary | ICD-10-CM | POA: Diagnosis not present

## 2017-04-28 DIAGNOSIS — Z833 Family history of diabetes mellitus: Secondary | ICD-10-CM | POA: Diagnosis not present

## 2017-04-28 DIAGNOSIS — Z7901 Long term (current) use of anticoagulants: Secondary | ICD-10-CM

## 2017-04-28 DIAGNOSIS — Z8674 Personal history of sudden cardiac arrest: Secondary | ICD-10-CM | POA: Diagnosis not present

## 2017-04-28 DIAGNOSIS — E876 Hypokalemia: Secondary | ICD-10-CM | POA: Diagnosis present

## 2017-04-28 DIAGNOSIS — Z95810 Presence of automatic (implantable) cardiac defibrillator: Secondary | ICD-10-CM | POA: Diagnosis not present

## 2017-04-28 DIAGNOSIS — Z9049 Acquired absence of other specified parts of digestive tract: Secondary | ICD-10-CM

## 2017-04-28 DIAGNOSIS — I482 Chronic atrial fibrillation: Secondary | ICD-10-CM | POA: Diagnosis not present

## 2017-04-28 HISTORY — DX: Hemorrhage of anus and rectum: K62.5

## 2017-04-28 LAB — COMPREHENSIVE METABOLIC PANEL
ALT: 15 U/L (ref 14–54)
AST: 21 U/L (ref 15–41)
Albumin: 3.5 g/dL (ref 3.5–5.0)
Alkaline Phosphatase: 42 U/L (ref 38–126)
Anion gap: 9 (ref 5–15)
BUN: 21 mg/dL — ABNORMAL HIGH (ref 6–20)
CO2: 22 mmol/L (ref 22–32)
Calcium: 9.1 mg/dL (ref 8.9–10.3)
Chloride: 110 mmol/L (ref 101–111)
Creatinine, Ser: 1.13 mg/dL — ABNORMAL HIGH (ref 0.44–1.00)
GFR calc Af Amer: 54 mL/min — ABNORMAL LOW (ref >60)
GFR calc non Af Amer: 47 mL/min — ABNORMAL LOW (ref >60)
Glucose, Bld: 123 mg/dL — ABNORMAL HIGH (ref 65–99)
Potassium: 4 mmol/L (ref 3.5–5.1)
Sodium: 141 mmol/L (ref 135–145)
Total Bilirubin: 0.6 mg/dL (ref 0.3–1.2)
Total Protein: 6 g/dL — ABNORMAL LOW (ref 6.5–8.1)

## 2017-04-28 LAB — GLUCOSE, CAPILLARY
Glucose-Capillary: 130 mg/dL — ABNORMAL HIGH (ref 65–99)
Glucose-Capillary: 109 mg/dL — ABNORMAL HIGH (ref 65–99)
Glucose-Capillary: 137 mg/dL — ABNORMAL HIGH (ref 65–99)
Glucose-Capillary: 110 mg/dL — ABNORMAL HIGH (ref 65–99)

## 2017-04-28 LAB — CBC
HCT: 34.4 % — ABNORMAL LOW (ref 36.0–46.0)
HCT: 33.5 % — ABNORMAL LOW (ref 36.0–46.0)
HCT: 28.3 % — ABNORMAL LOW (ref 36.0–46.0)
Hemoglobin: 11 g/dL — ABNORMAL LOW (ref 12.0–15.0)
Hemoglobin: 10.6 g/dL — ABNORMAL LOW (ref 12.0–15.0)
Hemoglobin: 9.2 g/dL — ABNORMAL LOW (ref 12.0–15.0)
MCH: 29.5 pg (ref 26.0–34.0)
MCH: 29.4 pg (ref 26.0–34.0)
MCH: 29.3 pg (ref 26.0–34.0)
MCHC: 32 g/dL (ref 30.0–36.0)
MCHC: 31.6 g/dL (ref 30.0–36.0)
MCHC: 32.5 g/dL (ref 30.0–36.0)
MCV: 92.2 fL (ref 78.0–100.0)
MCV: 92.8 fL (ref 78.0–100.0)
MCV: 90.1 fL (ref 78.0–100.0)
Platelets: 141 10*3/uL — ABNORMAL LOW (ref 150–400)
Platelets: 144 10*3/uL — ABNORMAL LOW (ref 150–400)
Platelets: 121 10*3/uL — ABNORMAL LOW (ref 150–400)
RBC: 3.73 MIL/uL — ABNORMAL LOW (ref 3.87–5.11)
RBC: 3.61 MIL/uL — ABNORMAL LOW (ref 3.87–5.11)
RBC: 3.14 MIL/uL — ABNORMAL LOW (ref 3.87–5.11)
RDW: 15.6 % — ABNORMAL HIGH (ref 11.5–15.5)
RDW: 15.5 % (ref 11.5–15.5)
RDW: 15.6 % — ABNORMAL HIGH (ref 11.5–15.5)
WBC: 5.5 10*3/uL (ref 4.0–10.5)
WBC: 5.6 10*3/uL (ref 4.0–10.5)
WBC: 7 10*3/uL (ref 4.0–10.5)

## 2017-04-28 LAB — SAMPLE TO BLOOD BANK

## 2017-04-28 LAB — CBC WITH DIFFERENTIAL/PLATELET
Basophils Absolute: 0 10*3/uL (ref 0.0–0.1)
Basophils Relative: 0 %
Eosinophils Absolute: 0.1 10*3/uL (ref 0.0–0.7)
Eosinophils Relative: 1 %
HCT: 38.9 % (ref 36.0–46.0)
Hemoglobin: 12.4 g/dL (ref 12.0–15.0)
Lymphocytes Relative: 42 %
Lymphs Abs: 2.1 10*3/uL (ref 0.7–4.0)
MCH: 29.3 pg (ref 26.0–34.0)
MCHC: 31.9 g/dL (ref 30.0–36.0)
MCV: 92 fL (ref 78.0–100.0)
Monocytes Absolute: 0.5 10*3/uL (ref 0.1–1.0)
Monocytes Relative: 9 %
Neutro Abs: 2.4 10*3/uL (ref 1.7–7.7)
Neutrophils Relative %: 48 %
Platelets: 148 10*3/uL — ABNORMAL LOW (ref 150–400)
RBC: 4.23 MIL/uL (ref 3.87–5.11)
RDW: 15.6 % — ABNORMAL HIGH (ref 11.5–15.5)
WBC: 5 10*3/uL (ref 4.0–10.5)

## 2017-04-28 LAB — PROTIME-INR
INR: 4.32
INR: 2.08
Prothrombin Time: 41.1 seconds — ABNORMAL HIGH (ref 11.4–15.2)
Prothrombin Time: 23.2 seconds — ABNORMAL HIGH (ref 11.4–15.2)

## 2017-04-28 LAB — POC OCCULT BLOOD, ED: Fecal Occult Bld: POSITIVE — AB

## 2017-04-28 LAB — MRSA PCR SCREENING: MRSA by PCR: NEGATIVE

## 2017-04-28 LAB — DIGOXIN LEVEL: Digoxin Level: 0.4 ng/mL — ABNORMAL LOW (ref 0.8–2.0)

## 2017-04-28 LAB — I-STAT CG4 LACTIC ACID, ED: Lactic Acid, Venous: 1.14 mmol/L (ref 0.5–1.9)

## 2017-04-28 MED ORDER — ONDANSETRON HCL 4 MG PO TABS: 4.0000 mg | ORAL_TABLET | Freq: Four times a day (QID) | ORAL | Status: DC | PRN

## 2017-04-28 MED ORDER — SODIUM CHLORIDE 0.9 % IV SOLN: INTRAVENOUS | Status: AC

## 2017-04-28 MED ORDER — TIZANIDINE HCL 4 MG PO TABS: 4.0000 mg | ORAL_TABLET | Freq: Four times a day (QID) | ORAL | Status: DC | PRN

## 2017-04-28 MED ORDER — VITAMIN K1 10 MG/ML IJ SOLN
5.0000 mg | Freq: Once | INTRAVENOUS | Status: AC
  Administered 2017-04-28: 5 mg via INTRAVENOUS
  Filled 2017-04-28: qty 0.5

## 2017-04-28 MED ORDER — SODIUM CHLORIDE 0.9 % IV BOLUS (SEPSIS)
500.0000 mL | Freq: Once | INTRAVENOUS | Status: AC
  Administered 2017-04-28: 500 mL via INTRAVENOUS

## 2017-04-28 MED ORDER — ACETAMINOPHEN 325 MG PO TABS
650.0000 mg | ORAL_TABLET | Freq: Four times a day (QID) | ORAL | Status: DC | PRN
  Administered 2017-04-28 – 2017-05-06 (×5): 650 mg via ORAL
  Filled 2017-04-28 (×6): qty 2

## 2017-04-28 MED ORDER — SODIUM CHLORIDE 0.9 % IV BOLUS (SEPSIS)
1000.0000 mL | Freq: Once | INTRAVENOUS | Status: AC
  Administered 2017-04-28: 1000 mL via INTRAVENOUS

## 2017-04-28 MED ORDER — ACETAMINOPHEN 650 MG RE SUPP: 650.0000 mg | Freq: Four times a day (QID) | RECTAL | Status: DC | PRN

## 2017-04-28 MED ORDER — DIGOXIN 125 MCG PO TABS
0.1250 mg | ORAL_TABLET | Freq: Every day | ORAL | Status: DC
  Administered 2017-04-28 – 2017-05-08 (×11): 0.125 mg via ORAL
  Filled 2017-04-28 (×11): qty 1

## 2017-04-28 MED ORDER — CARVEDILOL 3.125 MG PO TABS
3.1250 mg | ORAL_TABLET | Freq: Two times a day (BID) | ORAL | Status: DC
  Administered 2017-04-28: 3.125 mg via ORAL
  Filled 2017-04-28: qty 1

## 2017-04-28 MED ORDER — ONDANSETRON HCL 4 MG/2ML IJ SOLN: 4.0000 mg | Freq: Four times a day (QID) | INTRAMUSCULAR | Status: DC | PRN

## 2017-04-28 NOTE — H&P (Signed)
History and Physical    Merideth Bosque UXL:[id number] DOB: 1945-09-12 DOA: 04/28/2017  PCP: Lorene Dy, MD  Patient coming from: Home.  Chief Complaint: Rectal bleeding.  HPI: Angela [name] is a 71 y.o. female with history of nonischemic cardiomyopathy status post ICD placement, atrial fibrillation, mechanical mitral valve, colon cancer in remission presents to the ER the patient had a large bloody bowel movement at midnight. Patient states that she woke up in the middle of the night to go to the bathroom and had a large bloody bowel which was painless. Had mild dizziness denies any chest pain or shortness of breath. Denies any abdominal pain nausea vomiting.  ED Course: In the ER abdomen appeared benign. Patient's blood pressure is in the low normal which is usual for the patient. Hemoglobin was around 12.4 with lactate of 1.14. Creatinine is mildly elevated at 1.1. INR was 4.3 and your physician had given vitamin K 5 mg IV. Patient is being admitted for further observation of rectal bleeding.  Review of Systems: As per HPI, rest all negative.   Past Medical History:  Diagnosis Date  . Abnormality of gait 08/23/2014  . Arthritis   . Atrial fibrillation ([place])    A.  Chronic Coumadin  . Cardiac arrest - ventricular fibrillation    A.  12/2007;  B. 01/2008 St. Jude Promote Bi-V ICD placed  . CVA (cerebral vascular accident) ([place])   . Diverticular disease   . DVT (deep venous thrombosis) ([place])   . 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 History of colon cancer    A.  1999 - T3, N1  chemotherapy  . Internal hemorrhoids   . Memory deficit 08/23/2014  . Nonischemic cardiomyopathy ([place])    a.  11/2001 - Cath - NL Cors; b. ECHO 09/22/11: EF 20%, MVR normal, moderate LAE, mild RAE c. ECHO (08/2012): ED 20%, diff HK, LA mod dilated, mild/mod TR, RV mild/mod decreased sys fx  . Rheumatic heart disease    A. 1983 s/p  Bjork-Shiley  MVR  . Sick sinus syndrome ([place])    A.  s/p pacer in 1995.  B.    . Systolic CHF, chronic ([place])    A.  01/2008 Echo - EF 10-20%    Past Surgical History:  Procedure Laterality Date  . ABDOMINAL HYSTERECTOMY    . AV NODE ABLATION N/A 09/23/2011   Procedure: AV NODE ABLATION;  Surgeon: Evans Lance, MD;  Location[facility] CATH LAB;  Service: Cardiovascular;  Laterality: N/A;  . BIV ICD GENERTAOR CHANGE OUT N/A 09/05/2014   Procedure: BIV ICD GENERTAOR CHANGE OUT;  Surgeon: Evans Lance, MD;  Location: [facility] CATH LAB;  Service: Cardiovascular;  Laterality: N/A;  . Biventricular AICD    . COLON SURGERY  1999  . History of echocardiogram  2003, 2007, 2009  . Mitral Valve Replacement, Bjork-Shiley valve  1983     reports that she has never smoked. She has never used smokeless tobacco. She reports that she does not drink alcohol or use drugs.  No Known Allergies  Family History  Problem Relation Age of Onset  . Diabetes Mother 81  . Coronary artery disease Mother        s/p cabg  . Lung cancer Father 49       smoker  . Coronary artery disease Sister        s/p cabg    Prior  to Admission medications   Medication Sig Start Date End Date Taking? Authorizing Provider  alendronate (FOSAMAX) 70 MG tablet Take 70 mg by mouth once a week. Take with a full glass of water on an empty stomach.   Yes [provider]  carvedilol (COREG) 3.125 MG tablet TAKE ONE (1) TABLET BY MOUTH TWO (2) TIMES DAILY WITH A MEAL 03/03/17  Yes Evans Lance, MD  cholecalciferol (VITAMIN D) 1000 UNITS tablet Take 2,000 Units by mouth daily.   Yes [provider]  digoxin (LANOXIN) 0.125 MG tablet Take 0.125 mg by mouth daily.   Yes [provider]  furosemide (LASIX) 40 MG tablet TAKE ONE (1) TABLET BY MOUTH EVERY DAY. IF WEIGHT IS GREATER THAN 140LBS ON HOME SCALE, TAKE 2 TABLETS (80MG ) Patient taking differently: Take 20 mg by mouth daily as needed for fluid.  02/03/17  Yes Evans Lance,  MD  meclizine (ANTIVERT) 25 MG tablet Take 25 mg by mouth 2 (two) times daily as needed for dizziness or nausea.   Yes [provider]  pentoxifylline (TRENTAL) 400 MG CR tablet Take 400 mg by mouth daily as needed (leg pain).    Yes [provider]  spironolactone (ALDACTONE) 25 MG tablet TAKE ONE-HALF (1/2) TABLET BY MOUTH DAILY 03/05/17  Yes Evans Lance, MD  tiZANidine (ZANAFLEX) 4 MG tablet Take 4 mg by mouth every 6 (six) hours as needed for muscle spasms.   Yes [provider]  warfarin (COUMADIN) 6 MG tablet Take 8-9 mg by mouth daily. 8 mg everyday except Monday take 9 mg.   Yes [provider]    Physical Exam: Vitals:   04/28/17 0351 04/28/17 0400 04/28/17 0430  BP: (!) 81/60 94/62 99/72   Pulse: 71 67 62  Resp: 15 19 17   SpO2: 100% 99% 97%      Constitutional: Moderately built and nourished. Vitals:   04/28/17 0351 04/28/17 0400 04/28/17 0430  BP: (!) 81/60 94/62 99/72   Pulse: 71 67 62  Resp: 15 19 17   SpO2: 100% 99% 97%   Eyes: Anicteric no pallor. ENMT: No discharge from the ears eyes nose and mouth. Neck: No mass felt. No JVD appreciated. Respiratory: No rhonchi or crepitations. Cardiovascular: S1-S2 heard no murmurs appreciated. Abdomen: Soft nontender bowel sounds present. Musculoskeletal: No edema. No joint effusion. Skin: No rash. Skin appears warm. Neurologic: Alert awake oriented to time place and person. Moves all extremities. Psychiatric: Appears normal. Normal affect.   Labs on Admission: I have personally reviewed following labs and imaging studies  CBC:  Recent Labs Lab 04/28/17 0245  WBC 5.0  NEUTROABS 2.4  HGB 12.4  HCT 38.9  MCV 92.0  PLT 865*   Basic Metabolic Panel:  Recent Labs Lab 04/28/17 0245  NA 141  K 4.0  CL 110  CO2 22  GLUCOSE 123*  BUN 21*  CREATININE 1.13*  CALCIUM 9.1   GFR: CrCl cannot be calculated (Unknown ideal weight.). Liver Function Tests:  Recent Labs Lab  04/28/17 0245  AST 21  ALT 15  ALKPHOS 42  BILITOT 0.6  PROT 6.0*  ALBUMIN 3.5   No results for input(s): LIPASE, AMYLASE in the last 168 hours. No results for input(s): AMMONIA in the last 168 hours. Coagulation Profile:  Recent Labs Lab 04/28/17 0245  INR 4.32*   Cardiac Enzymes: No results for input(s): CKTOTAL, CKMB, CKMBINDEX, TROPONINI in the last 168 hours. BNP (last 3 results) No results for input(s): PROBNP in the last  8760 hours. HbA1C: No results for input(s): HGBA1C in the last 72 hours. CBG: No results for input(s): GLUCAP in the last 168 hours. Lipid Profile: No results for input(s): CHOL, HDL, LDLCALC, TRIG, CHOLHDL, LDLDIRECT in the last 72 hours. Thyroid Function Tests: No results for input(s): TSH, T4TOTAL, FREET4, T3FREE, THYROIDAB in the last 72 hours. Anemia Panel: No results for input(s): VITAMINB12, FOLATE, FERRITIN, TIBC, IRON, RETICCTPCT in the last 72 hours. Urine analysis:    Component Value Date/Time   COLORURINE YELLOW 10/23/2010 2318   APPEARANCEUR CLEAR 10/23/2010 2318   LABSPEC 1.011 10/23/2010 2318   PHURINE 5.0 10/23/2010 2318   GLUCOSEU NEGATIVE 02/03/2008 1545   HGBUR NEGATIVE 10/23/2010 2318   BILIRUBINUR NEGATIVE 10/23/2010 2318   KETONESUR NEGATIVE 10/23/2010 2318   PROTEINUR NEGATIVE 10/23/2010 2318   UROBILINOGEN 0.2 10/23/2010 2318   NITRITE NEGATIVE 10/23/2010 2318   LEUKOCYTESUR  10/23/2010 2318    NEGATIVE MICROSCOPIC NOT DONE ON URINES WITH NEGATIVE PROTEIN, BLOOD, LEUKOCYTES, NITRITE, OR GLUCOSE <1000 mg/dL.   Sepsis Labs: @LABRCNTIP (procalcitonin:4,lacticidven:4) )No results found for this or any previous visit (from the past 240 hour(s)).   Radiological Exams on Admission: No results found.   Assessment/Plan Principal Problem:   Rectal bleeding Active Problems:   Nonischemic cardiomyopathy (HCC)   Atrial fibrillation (HCC)   Sick sinus syndrome (HCC)   Systolic CHF, chronic (HCC)   S/P Mechanical MVR  (mitral valve replacement)   Dementia   ICD (implantable cardioverter-defibrillator), biventricular, in situ   Acute blood loss anemia    1. Rectal bleeding - patient last colonoscopy done in 2010 in our system shows diverticulosis of the sigmoid colon and descending colon. Which could likely be the source. However patient does have a history of colon cancer. For now will keep patient nothing by mouth and consult patient's gastroenterologist from [place] in a.m. Recheck INR. Follow CBC. 2. Coagulopathy secondary to Coumadin - ER physician had given vitamin K 5 mg IV. Patient has history of mechanical mitral valve and atrial fibrillation. If patient's bleeding tends to remain stable restart anticoagulation as soon as possible. Patient explained about the holding off anticoagulation and the risk. 3. Nonischemic cardiomyopathy - blood pressure is running in the low normal. 4. Atrial fibrillation - we'll continue Coreg as blood pressure is agreeable. May have to hold digoxin if creatinine worsens. 5. Acute renal failure probably from bleeding - follow metabolic panel. 6. History of colon cancer in remission - see #1.  I have reviewed patient's old charts and labs.   DVT prophylaxis: SCDs. Code Status: Full code.  Family Communication: Discussed with patient's husband.  Disposition Plan: Home.  Consults called: None.  Admission status: Inpatient.    Rise Patience MD [facility] Pager [phone].  If 7PM-7AM, please contact night-coverage [email or web address] Password [facility]  04/28/2017, 4:49 AM

## 2017-04-28 NOTE — Progress Notes (Signed)
Pt. BP 77/47, asymptomatic. Dr. Karleen Hampshire notified. NS bolus ordered, carvedilol held. BP improving, now 97/70. Rocco Pauls, RN

## 2017-04-28 NOTE — ED Triage Notes (Signed)
Patient arrived with EMS from home reports bloody stools this morning , denies abdominal pain or fever . No emesis .

## 2017-04-28 NOTE — Consult Note (Signed)
[facility] Consult: 10:08 AM 04/28/2017  LOS: 0 days    Referring Provider: Dr Karleen Hampshire  Primary Care Physician:  Lorene Dy, MD Primary Gastroenterologist:  Dr Lajoyce Corners >> Dr. Fuller Plan as of 2010    Reason for Consultation:  Rectal bleeding   HPI: Angela [name] is a 71 y.o. female.  PMH non-ischemic CM. EF 20% in 2014.  V fib arrest, s/p ICD. SSS, s/p pacemaker.  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  DVT.  A fib. AV node ablation 2013. Chronic Coumadin.  Osteoporosis on monthly Fosamax. .  CVA.  Dementia.  Thrombocytopenia, platelets to 120s-130s as far back as 2012.    Colon cancer, T3N1, right hemicolectomy 1999.  Polyp (lymphoid nodule) at [facility] in 2001.   No recurrent polyps on 2003, 2005 Colonoscopies.  04/2009 Colonoscopy: Moderately severe diverticulosis in the sigmoid to ascending.  Prior right hemi-colectomy.  4 mm sessile polyp (hyperplastic) in the descending colon.  Internal hemorrhoids 05/1998 EGD.  2 small antral polyps.   In 03/2014, Dr Fuller Plan felt, with pt 16 years out from colon cancer and multiple co-morbidities making for high risk sedation, that surveillance endoscopic colonoscopies not warranted.  Virtual colonoscopy 04/2014:  Multiple diverticula throughout the colon concentrated in rectosigmoid, descending colon.  No clinically significant polypoid lesion or constricting lesion seen.  Yesterday evening the patient had acute onset of dark, bloody hematochezia. She says she had some lower abdominal pain and some dark emesis. However her memory is not great and her husband does not endorse her having complained of pain and did not observe any emesis.  She had never had previous GI bleeding. She had the one episode at home and has had none since arrival in the ED. She doesn't use NSAIDs. No  dysphagia. She is not sure when she last took the monthly Fosamax, it may have been within the last couple of days. She tells me she felt a little bit dizzy but never passed out. No dyspnea. No chest pain.   INR 4.3 >> 2.0.  FOBT + Hgb 12.4 >> 10.6 after receiving 5 mg IV vitamin K..  MCV normal.  Platelets 140s BUN (21)/creat mildly elevated.     Past Medical History:  Diagnosis Date  . Abnormality of gait 08/23/2014  . Arthritis   . Atrial fibrillation ([place])    A.  Chronic Coumadin  . Cardiac arrest - ventricular fibrillation    A.  12/2007;  B. 01/2008 St. Jude Promote Bi-V ICD placed  . CVA (cerebral vascular accident) ([place])   . Diverticular disease   . DVT (deep venous thrombosis) ([place])   . 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 History of colon cancer    A.  1999 - T3, N1  chemotherapy  . Internal hemorrhoids   . Memory deficit 08/23/2014  . Nonischemic cardiomyopathy ([place])    a.  11/2001 - Cath - NL Cors; b. ECHO 09/22/11: EF 20%, MVR normal, moderate LAE, mild RAE c.  ECHO (08/2012): ED 20%, diff HK, LA mod dilated, mild/mod TR, RV mild/mod decreased sys fx  . Rheumatic heart disease    A. 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 Sick sinus syndrome ([place])    A.  s/p pacer in 1995.  B.    . Systolic CHF, chronic ([place])    A.  01/2008 Echo - EF 10-20%    Past Surgical History:  Procedure Laterality Date  . ABDOMINAL HYSTERECTOMY    . AV NODE ABLATION N/A 09/23/2011   Procedure: AV NODE ABLATION;  Surgeon: Evans Lance, MD;  Location: [facility] C[facility] LAB;  Service: Cardiovascular;  Laterality: N/A;  . BIV ICD GENERTAOR CHANGE OUT N/A 09/05/2014   Procedure: BIV ICD GENERTAOR CHANGE OUT;  Surgeon: Evans Lance, MD;  Location: [facility] CATH LAB;  Service: Cardiovascular;  Laterality: N/A;  . Biventricular AICD    . COLON SURGERY  1999  . History of echocardiogram  2003, 2007, 2009  . Mitral Valve Replacement, Bjork-Shiley valve  1983     Prior to Admission medications   Medication Sig Start Date End Date Taking? Authorizing Provider  alendronate (FOSAMAX) 70 MG tablet Take 70 mg by mouth once a week. Take with a full glass of water on an empty stomach.   Yes [provider]  carvedilol (COREG) 3.125 MG tablet TAKE ONE (1) TABLET BY MOUTH TWO (2) TIMES DAILY WITH A MEAL 03/03/17  Yes Evans Lance, MD  cholecalciferol (VITAMIN D) 1000 UNITS tablet Take 2,000 Units by mouth daily.   Yes [provider]  digoxin (LANOXIN) 0.125 MG tablet Take 0.125 mg by mouth daily.   Yes [provider]  furosemide (LASIX) 40 MG tablet TAKE ONE (1) TABLET BY MOUTH EVERY DAY. IF WEIGHT IS GREATER THAN 140LBS ON HOME SCALE, TAKE 2 TABLETS (80MG ) Patient taking differently: Take 20 mg by mouth daily as needed for fluid.  02/03/17  Yes Evans Lance, MD  meclizine (ANTIVERT) 25 MG tablet Take 25 mg by mouth 2 (two) times daily as needed for dizziness or nausea.   Yes [provider]  pentoxifylline (TRENTAL) 400 MG CR tablet Take 400 mg by mouth daily as needed (leg pain).    Yes [provider]  spironolactone (ALDACTONE) 25 MG tablet TAKE ONE-HALF (1/2) TABLET BY MOUTH DAILY 03/05/17  Yes Evans Lance, MD  tiZANidine (ZANAFLEX) 4 MG tablet Take 4 mg by mouth every 6 (six) hours as needed for muscle spasms.   Yes [provider]  warfarin (COUMADIN) 6 MG tablet Take 8-9 mg by mouth daily. 8 mg everyday except Monday take 9 mg.   Yes [provider]    Scheduled Meds: . carvedilol  3.125 mg Oral BID WC  . digoxin  0.125 mg Oral Daily   Infusions: . sodium chloride 75 mL/hr at 04/28/17 0615   PRN Meds: acetaminophen **OR** acetaminophen, ondansetron **OR** ondansetron (ZOFRAN) IV, tiZANidine   Allergies as of 04/28/2017  . (No Known Allergies)    Family History  Problem Relation Age of Onset  . Diabetes Mother 77  . Coronary artery disease Mother        s/p cabg  .  Lung cancer Father 19       smoker  . Coronary artery disease Sister        s/p cabg    Social History   Social History  . Marital status: Married    Spouse name: N/A  . Number of children:  1  . Years of education: N/A   Occupational History  . Retired Retired   Social History Main Topics  . Smoking status: Never Smoker  . Smokeless tobacco: Never Used  . Alcohol use No  . Drug use: No  . Sexual activity: No   Other Topics Concern  . Not on file   Social History Narrative   The patient is married and has 1 child.  Lives in [place] with husband.  She is retired from working at [employer].  She does not smoke or drink    REVIEW OF SYSTEMS: Constitutional:  Does not suffer from weakness or fatigue. ENT:  No nose bleeds Pulm:  o cough. No dyspnea. CV:  No palpitations, no LE edema.  GU:  No hematuria, no frequency GI:  No difficulty swallowing. No anorexia. Gyn:  11/2016 mamogram unremarkable.   Heme:  No unusual or excessive bleeding/bruising.   Transfusions:  No record of blood transfusions in the Epic archives Neuro:  Slight dizziness last evening, resolved.No headaches, no peripheral tingling or numbness Derm:  No itching, no rash or sores.  Endocrine:  No sweats or chills.  No polyuria or dysuria Immunization:  Did not inquire as to recent immunizations. Travel:  None beyond local counties in last few months.    PHYSICAL EXAM: Vital signs in last 24 hours: Vitals:   04/28/17 0844 04/28/17 0845  BP: 104/66   Pulse: 80 80  Resp:    Temp:    SpO2:     Wt Readings from Last 3 Encounters:  02/03/17 65.8 kg (145 lb)  01/02/16 61.3 kg (135 lb 3.2 oz)  12/06/14 59.5 kg (131 lb 3.2 oz)    General: pleasant, calm AAF.  Does not look ill. Head:  No facial asymmetry, swelling or signs of head trauma.  Eyes:  No scleral icterus. Conjunctiva is pale. EOMI. Ears:  Not hard of hearing.  Nose:  No discharge or congestion. Mouth:  Tongue midline. Oral mucosa  pink, moist, clear. Full upper denture and lower partial denture not removed for exam. Neck:  No JVD, no masses, no bruits, no thyromegaly. Lungs:  nonlabored breathing without cough. Lungs clear bilaterally. Heart: RRR. No MRG. S1, S2 present. Abdomen:  Soft. Non tender. No distention. No bruits, HSM, masses, hernias.  Active bowel sounds.   Rectal: deep red blood on exam glove. No masses.   Musc/Skeltl: no joint swelling, no significant contractures or deformities. Extremities:  No CCE.  Neurologic:  Alert. Oriented to self and hospital, but not to current year. She tend to me herher birthdate  but not the year of her birth.  Memory impaired. Follows all commands. No tremors. Full limb strength. Skin:  No rashes, sores or suspicious lesions. Tattoos: none Nodes:  No cervical or inguinal adenopathy.   Psych:  Pleasant, cooperative, calm.  Intake/Output from previous day: 09/02 0701 - 09/03 0700 In: -  Out: 400 [Stool:400] Intake/Output this shift: No intake/output data recorded.  LAB RESULTS:  Recent Labs  04/28/17 0245 04/28/17 0519 04/28/17 0851  WBC 5.0 5.5 5.6  HGB 12.4 11.0* 10.6*  HCT 38.9 34.4* 33.5*  PLT 148* 141* 144*   BMET Lab Results  Component Value Date   NA 141 04/28/2017   NA 140 08/24/2014   NA 139 04/19/2014   K 4.0 04/28/2017   K 4.5 08/24/2014   K 3.6 04/19/2014   CL 110 04/28/2017   CL 106 08/24/2014   CL 102 04/19/2014  CO2 22 04/28/2017   CO2 26 08/24/2014   CO2 30 04/19/2014   GLUCOSE 123 (H) 04/28/2017   GLUCOSE 81 08/24/2014   GLUCOSE 89 04/19/2014   BUN 21 (H) 04/28/2017   BUN 22 08/24/2014   BUN 16 04/19/2014   CREATININE 1.13 (H) 04/28/2017   CREATININE 1.2 08/24/2014   CREATININE 1.0 04/19/2014   CALCIUM 9.1 04/28/2017   CALCIUM 10.0 08/24/2014   CALCIUM 9.7 04/19/2014   LFT  Recent Labs  04/28/17 0245  PROT 6.0*  ALBUMIN 3.5  AST 21  ALT 15  ALKPHOS 42  BILITOT 0.6   PT/INR Lab Results  Component Value Date    INR 2.08 04/28/2017   INR 4.32 (HH) 04/28/2017   INR 3.39 (H) 09/05/2014   Hepatitis Panel No results for input(s): HEPBSAG, HCVAB, HEPAIGM, HEPBIGM in the last 72 hours. C-Diff No components found for: CDIFF Lipase  No results found for: LIPASE  Drugs of Abuse  No results found for: LABOPIA, COCAINSCRNUR, LABBENZ, AMPHETMU, THCU, LABBARB   RADIOLOGY STUDIES: No results found.    IMPRESSION:   *  GI bleed.  Sounds like a lower GI bleed. Known diverticulosis is the likely cause.  Unlikely recurrent colon neoplasia.    *  1999 right hemi-colectomy for colon cancer, no adenomas on subsequent colonoscopies thru 2010, no polyps or masses on 2015 vurtual colonoscopy  *  Chronic Coumadin in pt s/p remote mechanical MVR, hx DVT and A fib.  supratherapeutic INR corrected but not yet normalized following IV vitamin K..     *  Blood loss anemia. Not in need of transfusion at present.  *  Thrombocytopenia, noncritical, chronic.  *  Mild AKI.  Complementary rise of both BUN and creatinine.   *  Nonischemic cardiomyopathy.  Asymptomatic. Has pacemaker and ICD in place.    PLAN:     *  colonoscopy?   *  Diet, clear liquids.    Azucena Freed  04/28/2017, 10:08 AM Pager: [phone]

## 2017-04-28 NOTE — Progress Notes (Signed)
Angela [name] is a 71 y.o. female with history of nonischemic cardiomyopathy status post ICD placement, atrial fibrillation, mechanical mitral valve, colon cancer in remission presents to the ER for rectal bleeding. She was found to have supra therapeutic INR, was given vitamin K in ed.  Hypotensive earlier today. No more episodes of rectal bleeding.   Plan:  1. Fluid bolus to keep MAP> 65. 2. Hold coreg till bp improves.  3. GI consult and possible colonoscopy in am.  4. Monitor H&H tonight.    Hosie Poisson, MD [phone]

## 2017-04-28 NOTE — ED Provider Notes (Signed)
[facility] DEPT Provider Note   CSN: [id number] Arrival date & time: 04/28/17  0226     History   Chief Complaint Chief Complaint  Patient presents with  . GI Bleeding    HPI Angela [name] is a 71 y.o. female.  Patient presents to the emergency department for evaluation of rectal bleeding. Patient has a history of atrial fibrillation, is on Coumadin. She reports that she went to the bathroom tonight and swelling large amount of dark blood in the toilet. She has not been experiencing any nausea or vomiting. She has not had any abdominal pain. She does report that she did have similar bleeding in the past, but none recently.      Past Medical History:  Diagnosis Date  . Abnormality of gait 08/23/2014  . Arthritis   . Atrial fibrillation ([place])    A.  Chronic Coumadin  . Cardiac arrest - ventricular fibrillation    A.  12/2007;  B. 01/2008 St. Jude Promote Bi-V ICD placed  . CVA (cerebral vascular accident) ([place])   . Diverticular disease   . DVT (deep venous thrombosis) ([place])   . 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 History of colon cancer    A.  1999 - T3, N1  chemotherapy  . Internal hemorrhoids   . Memory deficit 08/23/2014  . Nonischemic cardiomyopathy ([place])    a.  11/2001 - Cath - NL Cors; b. ECHO 09/22/11: EF 20%, MVR normal, moderate LAE, mild RAE c. ECHO (08/2012): ED 20%, diff HK, LA mod dilated, mild/mod TR, RV mild/mod decreased sys fx  . Rheumatic heart disease    A. 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 Sick sinus syndrome ([place])    A.  s/p pacer in 1995.  B.    . Systolic CHF, chronic ([place])    A.  01/2008 Echo - EF 10-20%    Patient Active Problem List   Diagnosis Date Noted  . ICD (implantable cardioverter-defibrillator), biventricular, in situ 08/24/2014  . Memory deficit 08/23/2014  . Abnormality of gait 08/23/2014  . Dementia 12/17/2013  . S/P Mechanical MVR (mitral valve replacement)  07/12/2012  . Hypotension due to drugs 07/12/2012  . Hypokalemia 07/12/2012  . NSVT (nonsustained ventricular tachycardia) ([place]) 07/12/2012  . Acute on chronic systolic heart failure ([place]) 07/07/2012  . Ventricular fibrillation ([place]) 11/11/2011  . Warfarin anticoagulation 09/22/2011  . Nonischemic cardiomyopathy ([place])   . Atrial fibrillation ([place])   . History of colon cancer   . Internal hemorrhoids   . Rheumatic heart disease   . Sick sinus syndrome ([place])   . Systolic CHF, chronic ([place])   . DIVERTICULAR DISEASE 06/05/2009    Past Surgical History:  Procedure Laterality Date  . ABDOMINAL HYSTERECTOMY    . AV NODE ABLATION N/A 09/23/2011   Procedure: AV NODE ABLATION;  Surgeon: Evans Lance, MD;  Location: [facility] CATH LAB;  Service: Cardiovascular;  Laterality: N/A;  . BIV ICD GENERTAOR CHANGE OUT N/A 09/05/2014   Procedure: BIV ICD GENERTAOR CHANGE OUT;  Surgeon: Evans Lance, MD;  Location: [facility] CATH LAB;  Service: Cardiovascular;  Laterality: N/A;  . Biventricular AICD    . COLON SURGERY  1999  . History of echocardiogram  2003, 2007, 2009  . Mitral Valve Replacement, Bjork-Shiley valve  1983    OB History    No data available       Home Medications  Prior to Admission medications   Medication Sig Start Date End Date Taking? Authorizing Provider  alendronate (FOSAMAX) 70 MG tablet Take 70 mg by mouth once a week. Take with a full glass of water on an empty stomach.   Yes [provider]  carvedilol (COREG) 3.125 MG tablet TAKE ONE (1) TABLET BY MOUTH TWO (2) TIMES DAILY WITH A MEAL 03/03/17  Yes Evans Lance, MD  cholecalciferol (VITAMIN D) 1000 UNITS tablet Take 2,000 Units by mouth daily.   Yes [provider]  digoxin (LANOXIN) 0.125 MG tablet Take 0.125 mg by mouth daily.   Yes [provider]  furosemide (LASIX) 40 MG tablet TAKE ONE (1) TABLET BY MOUTH EVERY DAY. IF WEIGHT IS GREATER THAN 140LBS ON HOME SCALE, TAKE 2 TABLETS  (80MG ) Patient taking differently: Take 20 mg by mouth daily as needed for fluid.  02/03/17  Yes Evans Lance, MD  meclizine (ANTIVERT) 25 MG tablet Take 25 mg by mouth 2 (two) times daily as needed for dizziness or nausea.   Yes [provider]  pentoxifylline (TRENTAL) 400 MG CR tablet Take 400 mg by mouth daily as needed (leg pain).    Yes [provider]  spironolactone (ALDACTONE) 25 MG tablet TAKE ONE-HALF (1/2) TABLET BY MOUTH DAILY 03/05/17  Yes Evans Lance, MD  tiZANidine (ZANAFLEX) 4 MG tablet Take 4 mg by mouth every 6 (six) hours as needed for muscle spasms.   Yes [provider]  warfarin (COUMADIN) 6 MG tablet Take 8-9 mg by mouth daily. 8 mg everyday except Monday take 9 mg.   Yes [provider]    Family History Family History  Problem Relation Age of Onset  . Diabetes Mother 11  . Coronary artery disease Mother        s/p cabg  . Lung cancer Father 14       smoker  . Coronary artery disease Sister        s/p cabg    Social History Social History  Substance Use Topics  . Smoking status: Never Smoker  . Smokeless tobacco: Never Used  . Alcohol use No     Allergies   Patient has no known allergies.   Review of Systems Review of Systems  Gastrointestinal: Positive for blood in stool.  All other systems reviewed and are negative.    Physical Exam Updated Vital Signs BP (!) 81/60   Pulse 71   Resp 15   SpO2 100%   Physical Exam  Constitutional: She is oriented to person, place, and time. She appears well-developed and well-nourished. No distress.  HENT:  Head: Normocephalic and atraumatic.  Right Ear: Hearing normal.  Left Ear: Hearing normal.  Nose: Nose normal.  Mouth/Throat: Oropharynx is clear and moist and mucous membranes are normal.  Eyes: Pupils are equal, round, and reactive to light. Conjunctivae and EOM are normal.  Neck: Normal range of motion. Neck supple.  Cardiovascular: Regular rhythm, S1  normal and S2 normal.  Exam reveals no gallop and no friction rub.   No murmur heard. Pulmonary/Chest: Effort normal and breath sounds normal. No respiratory distress. She exhibits no tenderness.  Abdominal: Soft. Normal appearance and bowel sounds are normal. There is no hepatosplenomegaly. There is no tenderness. There is no rebound, no guarding, no tenderness at McBurney's point and negative Murphy's sign. No hernia.  Genitourinary:  Genitourinary Comments: Rectal exam: normal tone, no external hemorrhoids, gross blood present ( dark, maroon)  Musculoskeletal: Normal range  of motion.  Neurological: She is alert and oriented to person, place, and time. She has normal strength. No cranial nerve deficit or sensory deficit. Coordination normal. GCS eye subscore is 4. GCS verbal subscore is 5. GCS motor subscore is 6.  Skin: Skin is warm, dry and intact. No rash noted. No cyanosis.  Psychiatric: She has a normal mood and affect. Her speech is normal and behavior is normal. Thought content normal.  Nursing note and vitals reviewed.    ED Treatments / Results  Labs (all labs ordered are listed, but only abnormal results are displayed) Labs Reviewed  CBC WITH DIFFERENTIAL/PLATELET - Abnormal; Notable for the following:       Result Value   RDW 15.6 (*)    Platelets 148 (*)    All other components within normal limits  COMPREHENSIVE METABOLIC PANEL - Abnormal; Notable for the following:    Glucose, Bld 123 (*)    BUN 21 (*)    Creatinine, Ser 1.13 (*)    Total Protein 6.0 (*)    GFR calc non Af Amer 47 (*)    GFR calc Af Amer 54 (*)    All other components within normal limits  PROTIME-INR - Abnormal; Notable for the following:    Prothrombin Time 41.1 (*)    INR 4.32 (*)    All other components within normal limits  POC OCCULT BLOOD, ED - Abnormal; Notable for the following:    Fecal Occult Bld POSITIVE (*)    All other components within normal limits  POC OCCULT BLOOD, ED  SAMPLE  TO BLOOD BANK    EKG  EKG Interpretation  Date/Time:  Monday April 28 2017 02:28:49 EDT Ventricular Rate:  94 PR Interval:    QRS Duration: 158 QT Interval:  426 QTC Calculation: 450 R Axis:   84 Text Interpretation:  Atrial fibrillation Ventricular bigeminy LVH with secondary repolarization abnormality Anterior infarct, old Confirmed by Orpah Greek [phone]) on 04/28/2017 3:04:52 AM       Radiology No results found.  Procedures Procedures (including critical care time)  Medications Ordered in ED Medications  phytonadione (VITAMIN K) 5 mg in dextrose 5 % 50 mL IVPB (not administered)     Initial Impression / Assessment and Plan / ED Course  I have reviewed the triage vital signs and the nursing notes.  Pertinent labs & imaging results that were available during my care of the patient were reviewed by me and considered in my medical decision making (see chart for details).     Patient presents to the emergency department for evaluation of rectal bleeding which began earlier tonight. Patient passing dark maroon blood per rectum. She is not experiencing any rectal pain or abdominal pain. Patient is on long-term anticoagulation secondary to atrial fibrillation. INR is elevated at 4.32.  Discussed with pharmacy, recommended vitamin K 5 mg IV.  Patient's blood pressure is 94 systolic at the moment. Reviewing her most recent visits to cardiology reveal blood pressures that range from low 01S to 100systolic is normal for her. She is not tachycardic, suspect that this is her normal blood pressure, not hypotension secondary to blood loss. She will, however, require hospitalization for further monitoring of hemoglobin and further bleeding.  Final Clinical Impressions(s) / ED Diagnoses   Final diagnoses:  Lower GI bleed    New Prescriptions New Prescriptions   No medications on file     Orpah Greek, MD 04/28/17 [phone]

## 2017-04-29 DIAGNOSIS — F0390 Unspecified dementia without behavioral disturbance: Secondary | ICD-10-CM

## 2017-04-29 DIAGNOSIS — D649 Anemia, unspecified: Secondary | ICD-10-CM

## 2017-04-29 DIAGNOSIS — I480 Paroxysmal atrial fibrillation: Secondary | ICD-10-CM

## 2017-04-29 DIAGNOSIS — K922 Gastrointestinal hemorrhage, unspecified: Secondary | ICD-10-CM

## 2017-04-29 LAB — RETICULOCYTES
RBC.: 3.15 MIL/uL — ABNORMAL LOW (ref 3.87–5.11)
Retic Count, Absolute: 66.2 10*3/uL (ref 19.0–186.0)
Retic Ct Pct: 2.1 % (ref 0.4–3.1)

## 2017-04-29 LAB — CBC
HCT: 25.7 % — ABNORMAL LOW (ref 36.0–46.0)
HCT: 29.2 % — ABNORMAL LOW (ref 36.0–46.0)
Hemoglobin: 8.3 g/dL — ABNORMAL LOW (ref 12.0–15.0)
Hemoglobin: 9.3 g/dL — ABNORMAL LOW (ref 12.0–15.0)
MCH: 29.4 pg (ref 26.0–34.0)
MCH: 29.5 pg (ref 26.0–34.0)
MCHC: 31.8 g/dL (ref 30.0–36.0)
MCHC: 32.3 g/dL (ref 30.0–36.0)
MCV: 91.1 fL (ref 78.0–100.0)
MCV: 92.7 fL (ref 78.0–100.0)
Platelets: 113 10*3/uL — ABNORMAL LOW (ref 150–400)
Platelets: 134 10*3/uL — ABNORMAL LOW (ref 150–400)
RBC: 2.82 MIL/uL — ABNORMAL LOW (ref 3.87–5.11)
RBC: 3.15 MIL/uL — ABNORMAL LOW (ref 3.87–5.11)
RDW: 15.5 % (ref 11.5–15.5)
RDW: 15.8 % — ABNORMAL HIGH (ref 11.5–15.5)
WBC: 5.2 10*3/uL (ref 4.0–10.5)
WBC: 5.5 10*3/uL (ref 4.0–10.5)

## 2017-04-29 LAB — IRON AND TIBC
Iron: 91 ug/dL (ref 28–170)
Saturation Ratios: 31 % (ref 10.4–31.8)
TIBC: 293 ug/dL (ref 250–450)
UIBC: 202 ug/dL

## 2017-04-29 LAB — PROTIME-INR
INR: 1.57
Prothrombin Time: 18.6 seconds — ABNORMAL HIGH (ref 11.4–15.2)

## 2017-04-29 LAB — GLUCOSE, CAPILLARY
Glucose-Capillary: 99 mg/dL (ref 65–99)
Glucose-Capillary: 100 mg/dL — ABNORMAL HIGH (ref 65–99)
Glucose-Capillary: 96 mg/dL (ref 65–99)
Glucose-Capillary: 126 mg/dL — ABNORMAL HIGH (ref 65–99)

## 2017-04-29 LAB — FERRITIN: Ferritin: 211 ng/mL (ref 11–307)

## 2017-04-29 LAB — VITAMIN B12: Vitamin B-12: 210 pg/mL (ref 180–914)

## 2017-04-29 LAB — FOLATE: Folate: 17.5 ng/mL (ref >5.9)

## 2017-04-29 NOTE — Progress Notes (Signed)
Daily Rounding Note  04/29/2017, 9:11 AM  LOS: 1 day   SUBJECTIVE:   Chief complaint: painless hematochezia.  Last episode, last stool was early yesterday AM, still dark.      Pt denies abd pain and n/v.  No dyspnea or chest pain  OBJECTIVE:         Vital signs in last 24 hours:    Temp:  [98.1 F (36.7 C)-98.8 F (37.1 C)] 98.8 F (37.1 C) (09/04 0551) Pulse Rate:  [37-141] 81 (09/04 0551) Resp:  [14-25] 16 (09/03 1952) BP: (76-105)/(47-82) 102/69 (09/03 1952) SpO2:  [98 %-100 %] 100 % (09/04 0551) Weight:  [64.6 kg (142 lb 6.4 oz)] 64.6 kg (142 lb 6.4 oz) (09/04 0551) Last BM Date: 04/28/17 Filed Weights   04/29/17 0551  Weight: 64.6 kg (142 lb 6.4 oz)   General: looks well, sitting in room with husband.   Heart: Irreg, irreg, rate controlled.   Chest: clear bil.  No labored breathing Abdomen: soft, active BS, NT, ND  Extremities: no CCE Neuro/Psych:  Alert, not oriented to place, time, can tell me her name but not year of birth.  Follows commands.  No limb weakness or tremor  Intake/Output from previous day: 09/03 0701 - 09/04 0700 In: 1841.3 [P.O.:360; I.V.:1481.3] Out: 250 [Urine:250]  Intake/Output this shift: No intake/output data recorded.  Lab Results:  Recent Labs  04/28/17 0851 04/28/17 1709 04/29/17 0002  WBC 5.6 7.0 5.2  HGB 10.6* 9.2* 8.3*  HCT 33.5* 28.3* 25.7*  PLT 144* 121* 113*   BMET  Recent Labs  04/28/17 0245  NA 141  K 4.0  CL 110  CO2 22  GLUCOSE 123*  BUN 21*  CREATININE 1.13*  CALCIUM 9.1   LFT  Recent Labs  04/28/17 0245  PROT 6.0*  ALBUMIN 3.5  AST 21  ALT 15  ALKPHOS 42  BILITOT 0.6   PT/INR  Recent Labs  04/28/17 0851 04/29/17 0002  LABPROT 23.2* 18.6*  INR 2.08 1.57   Hepatitis Panel No results for input(s): HEPBSAG, HCVAB, HEPAIGM, HEPBIGM in the last 72 hours.  Studies/Results: No results found.   Scheduled Meds: . digoxin  0.125 mg  Oral Daily   Continuous Infusions: PRN Meds:.acetaminophen **OR** acetaminophen, ondansetron **OR** ondansetron (ZOFRAN) IV, tiZANidine   ASSESMENT:   *  Painless hematochezia.   Colectomy for cancer 1999.  No recurrent polyps on colonoscopies thru 2010.  No worrisome findings on virtual colonoscopy 2015.  All studies confirm diverticulosis which is likely source of bleeding.    *  Dementia   *  Chronic Coumadin, INR 4.3 >> 1.5 after Vitamin K.     *  Hx CHF.  Currently not decompensated, suspect EF has improved from 20% noted in 2014.    PLAN   *  CBC in AM.  Advance to heart diet.   *  No plans for colonoscopy.  Bleeding has stopped so no role for CT angio/embolization.    *  Timing of restarting Coumadin will need to be determined.     Azucena Freed  04/29/2017, 9:11 AM Pager: [phone]   Attending physician's note   I have taken an interval history, reviewed the chart and examined the patient. I agree with the Advanced Practitioner's note, impression and recommendations.  No evidence of ongoing bleed. Hgb trending down. Will continue to monitor Patient has significant dementia, is not in a state to make medical decisions. Discussed in detail  with her husband and he wants patient to be managed conservatively and avoid procedures or colonoscopy.  Will hold off endoscopic evaluation unless patient develops recurrent hematochezia and evidence of ongoing bleeding.    Damaris Hippo, MD [phone] Mon-Fri 8a-5p [phone] after 5p, weekends, holidays

## 2017-04-29 NOTE — Progress Notes (Signed)
PROGRESS NOTE    Angela [name]  WUX:[id number] DOB: Dec 27, 1945 DOA: 04/28/2017 PCP: Lorene Dy, MD   Brief Narrative:   Angela [name] is a 71 y.o. female with history of nonischemic cardiomyopathy status post ICD placement, atrial fibrillation, mechanical mitral valve, colon cancer in remission presents to the ER for multiple bloody bowel movements. She was admitted for evaluation of GI bleed. GI consulted, and recommended no indication of colonoscopy.    Assessment & Plan:   Principal Problem:   Rectal bleeding Active Problems:   Nonischemic cardiomyopathy (HCC)   Atrial fibrillation (HCC)   Sick sinus syndrome (HCC)   Systolic CHF, chronic (HCC)   S/P Mechanical MVR (mitral valve replacement)   Dementia   ICD (implantable cardioverter-defibrillator), biventricular, in situ   Acute blood loss anemia   Rectal bleeding/ acute blood loss anemia:  Admitted to telemetry. Rectal bleeding has improved. Only one bm this am with some blood in it. Suspect the rectal bleed from diverticulosis and supra therapeutic INR. Currently anti coagulation is on hold. Gi consulted recommendations to avoid colonoscopy if possible due to her multiple co morbidities and dementia.  She was started on clears on admission and slowly advanced to regular diet . Transfuse to keep hemoglobin greater than 7.  Baseline hemoglobin between 12 to 15, on admission it was 12.4 - 11- 10.6 - 9.2 to 8.3 today.  Anemia panel sent and pending.   NICM: All her medications except for dig are on hold due to hypotension yesterday.  Plan to resume her coreg possibly on discharge.    Atrial fibrillation:  Rate controlled. To start coumadin once she is cleared by GI.    Dementia:  No agitation.   stable.        DVT prophylaxis: scd's Code Status: full code.  Family Communication: discussed with family at bedside Disposition Plan: pending work up. PT evaluation.    Consultants:   Gastroenterology Dr  Silverio Decamp.   Procedures: none.    Antimicrobials: none.    Subjective: One bm this am , which is dark and slightly bloody. Family members at bedside confirmed this.   Objective: Vitals:   04/28/17 1634 04/28/17 1952 04/29/17 0551 04/29/17 1438  BP:  102/69  (!) 128/40  Pulse:  65 81 72  Resp:  16    Temp: 98.2 F (36.8 C) 98.8 F (37.1 C) 98.8 F (37.1 C) 98.9 F (37.2 C)  TempSrc: Oral Oral Oral Oral  SpO2:  100% 100% 100%  Weight:   64.6 kg (142 lb 6.4 oz)   Height:        Intake/Output Summary (Last 24 hours) at 04/29/17 1556 Last data filed at 04/29/17 1000  Gross per 24 hour  Intake          2081.25 ml  Output              100 ml  Net          1981.25 ml   Filed Weights   04/29/17 0551  Weight: 64.6 kg (142 lb 6.4 oz)    Examination:  General exam: Appears calm and comfortable  Respiratory system: Clear to auscultation. Respiratory effort normal. Cardiovascular system: S1 & S2 heard, irregular, No JVD, murmurs, rubs, gallops or clicks. No pedal edema. Gastrointestinal system: Abdomen is nondistended, soft and nontender. No organomegaly or masses felt. Normal bowel sounds heard. Central nervous system: Alert, oriented to place and person , not to time. . No focal neurological deficits. Extremities: Symmetric 5 x  5 power. No cyanosis or clubbing.  Skin: No rashes, lesions or ulcers Psychiatry: Mood & affect appropriate.     Data Reviewed: I have personally reviewed following labs and imaging studies  CBC:  Recent Labs Lab 04/28/17 0245 04/28/17 0519 04/28/17 0851 04/28/17 1709 04/29/17 0002  WBC 5.0 5.5 5.6 7.0 5.2  NEUTROABS 2.4  --   --   --   --   HGB 12.4 11.0* 10.6* 9.2* 8.3*  HCT 38.9 34.4* 33.5* 28.3* 25.7*  MCV 92.0 92.2 92.8 90.1 91.1  PLT 148* 141* 144* 121* 829*   Basic Metabolic Panel:  Recent Labs Lab 04/28/17 0245  NA 141  K 4.0  CL 110  CO2 22  GLUCOSE 123*  BUN 21*  CREATININE 1.13*  CALCIUM 9.1   GFR: Estimated  Creatinine Clearance: 43.1 mL/min (A) (by C-G formula based on SCr of 1.13 mg/dL (H)). Liver Function Tests:  Recent Labs Lab 04/28/17 0245  AST 21  ALT 15  ALKPHOS 42  BILITOT 0.6  PROT 6.0*  ALBUMIN 3.5   No results for input(s): LIPASE, AMYLASE in the last 168 hours. No results for input(s): AMMONIA in the last 168 hours. Coagulation Profile:  Recent Labs Lab 04/28/17 0245 04/28/17 0851 04/29/17 0002  INR 4.32* 2.08 1.57   Cardiac Enzymes: No results for input(s): CKTOTAL, CKMB, CKMBINDEX, TROPONINI in the last 168 hours. BNP (last 3 results) No results for input(s): PROBNP in the last 8760 hours. HbA1C: No results for input(s): HGBA1C in the last 72 hours. CBG:  Recent Labs Lab 04/28/17 1115 04/28/17 1623 04/28/17 2208 04/29/17 0729 04/29/17 1138  GLUCAP 109* 137* 110* 99 100*   Lipid Profile: No results for input(s): CHOL, HDL, LDLCALC, TRIG, CHOLHDL, LDLDIRECT in the last 72 hours. Thyroid Function Tests: No results for input(s): TSH, T4TOTAL, FREET4, T3FREE, THYROIDAB in the last 72 hours. Anemia Panel: No results for input(s): VITAMINB12, FOLATE, FERRITIN, TIBC, IRON, RETICCTPCT in the last 72 hours. Sepsis Labs:  Recent Labs Lab 04/28/17 0523  LATICACIDVEN 1.14    Recent Results (from the past 240 hour(s))  MRSA PCR Screening     Status: None   Collection Time: 04/28/17  8:08 AM  Result Value Ref Range Status   MRSA by PCR NEGATIVE NEGATIVE Final    Comment:        The GeneXpert MRSA Assay (FDA approved for NASAL specimens only), is one component of a comprehensive MRSA colonization surveillance program. It is not intended to diagnose MRSA infection nor to guide or monitor treatment for MRSA infections.          Radiology Studies: No results found.      Scheduled Meds: . digoxin  0.125 mg Oral Daily   Continuous Infusions:   LOS: 1 day    Time spent: 35 minutes.    Hosie Poisson, MD [facility] Pager  [phone]   If 7PM-7AM, please contact night-coverage [email or web address] Password [email or web address] 04/29/2017, 3:56 PM

## 2017-04-30 LAB — CBC
HCT: 25.2 % — ABNORMAL LOW (ref 36.0–46.0)
Hemoglobin: 8.2 g/dL — ABNORMAL LOW (ref 12.0–15.0)
MCH: 29.6 pg (ref 26.0–34.0)
MCHC: 32.5 g/dL (ref 30.0–36.0)
MCV: 91 fL (ref 78.0–100.0)
Platelets: 113 10*3/uL — ABNORMAL LOW (ref 150–400)
RBC: 2.77 MIL/uL — ABNORMAL LOW (ref 3.87–5.11)
RDW: 15.7 % — ABNORMAL HIGH (ref 11.5–15.5)
WBC: 4.6 10*3/uL (ref 4.0–10.5)

## 2017-04-30 LAB — GLUCOSE, CAPILLARY
Glucose-Capillary: 111 mg/dL — ABNORMAL HIGH (ref 65–99)
Glucose-Capillary: 96 mg/dL (ref 65–99)
Glucose-Capillary: 108 mg/dL — ABNORMAL HIGH (ref 65–99)
Glucose-Capillary: 118 mg/dL — ABNORMAL HIGH (ref 65–99)

## 2017-04-30 MED ORDER — HEPARIN (PORCINE) IN NACL 100-0.45 UNIT/ML-% IJ SOLN
1100.0000 [IU]/h | INTRAMUSCULAR | Status: DC
  Administered 2017-04-30: 750 [IU]/h via INTRAVENOUS
  Administered 2017-05-01: 500 [IU]/h via INTRAVENOUS
  Administered 2017-05-05: 650 [IU]/h via INTRAVENOUS
  Administered 2017-05-07: 850 [IU]/h via INTRAVENOUS
  Filled 2017-04-30 (×5): qty 250

## 2017-04-30 MED ORDER — CARVEDILOL 3.125 MG PO TABS
3.1250 mg | ORAL_TABLET | Freq: Two times a day (BID) | ORAL | Status: DC
  Administered 2017-04-30 – 2017-05-06 (×11): 3.125 mg via ORAL
  Filled 2017-04-30 (×11): qty 1

## 2017-04-30 NOTE — Progress Notes (Addendum)
[facility] PROGRESS NOTE  Angela [name] EPP:[id number] DOB: 01/30/1946 DOA: 04/28/2017 PCP: Lorene Dy, MD  Interim summary and HPI 71 y.o. female with history of nonischemic cardiomyopathy status post ICD placement, atrial fibrillation, mechanical mitral valve, colon cancer in remission presents to the ER the patient had a large bloody bowel movement at midnight. Patient states that she woke up in the middle of the night to go to the bathroom and had a large bloody bowel which was painless. Had mild dizziness denies any chest pain or shortness of breath. Denies any abdominal pain nausea vomiting.  Assessment/Plan: 1-GI bleed: presumed to be lower GI bleed. -GI service discussed with husband and the decision was made for no invasive therapy  -no further blood in stools -continue holding coumadin, until clear by GI -will start patient on heparin and follow Hgb trend  -patient tolerating diet   2-A. Fib and mechanical valve -will start heparin -restart low dose b-blocker -continue monitoring on telemetry -continue digoxin  3-non-ischemic cardiomyopathy/sick sinus syndrome   -s/p ICD -follow daily weights and strict I's/O's  4-dementia -poor insight  -no agitation -will continue supportive care  5-acute blood loss anemia -due to GI bleed -follow Hgb trend and transfuse as needed   6-hx of colon cancer  -appears to be in remission  -continue outpatient follow up with GI and oncology and she was previously following   Code Status: Full Family Communication: husband at bedside  Disposition Plan: to be determined. Will start patient on heparin; follow Hgb trend and resume coumadin when clear by GI.   Consultants:  GI  Procedures:  None   Antibiotics:  None   HPI/Subjective: Afebrile, no CP or SOB. No nausea or vomiting. No blood in her stools described.  Objective: Vitals:   04/30/17 0448 04/30/17 1300  BP: (!) 115/54 (!) 100/51  Pulse: 86 79  Resp:  (!) 26 (!) 28  Temp: 98.3 F (36.8 C) 98.4 F (36.9 C)  SpO2: 100% 100%    Intake/Output Summary (Last 24 hours) at 04/30/17 1513 Last data filed at 04/30/17 1404  Gross per 24 hour  Intake              440 ml  Output              350 ml  Net               90 ml   Filed Weights   04/29/17 0551 04/30/17 0448  Weight: 64.6 kg (142 lb 6.4 oz) 63.8 kg (140 lb 11.2 oz)    Exam:   General: afebrile, no CP, no HA's or blurred vision. Patient reported no further blood in stools. Oriented X2, poor insight.  Cardiovascular: S1 and S2, no rubs, no gallops, positive metallic click and soft SEM appreciated.  Respiratory: good air movement, no wheezing, no crackles.  Abdomen: soft, NT, ND, positive BS  Musculoskeletal: no edema, no cyanosis, no clubbing.  Data Reviewed: Basic Metabolic Panel:  Recent Labs Lab 04/28/17 0245  NA 141  K 4.0  CL 110  CO2 22  GLUCOSE 123*  BUN 21*  CREATININE 1.13*  CALCIUM 9.1   Liver Function Tests:  Recent Labs Lab 04/28/17 0245  AST 21  ALT 15  ALKPHOS 42  BILITOT 0.6  PROT 6.0*  ALBUMIN 3.5   CBC:  Recent Labs Lab 04/28/17 0245  04/28/17 0851 04/28/17 1709 04/29/17 0002 04/29/17 1620 04/30/17 0716  WBC 5.0  < > 5.6 7.0 5.2 5.5 4.6  NEUTROABS 2.4  --   --   --   --   --   --   HGB 12.4  < > 10.6* 9.2* 8.3* 9.3* 8.2*  HCT 38.9  < > 33.5* 28.3* 25.7* 29.2* 25.2*  MCV 92.0  < > 92.8 90.1 91.1 92.7 91.0  PLT 148*  < > 144* 121* 113* 134* 113*  < > = values in this interval not displayed.   CBG:  Recent Labs Lab 04/29/17 1138 04/29/17 1643 04/29/17 2039 04/30/17 0750 04/30/17 1206  GLUCAP 100* 96 126* 111* 96    Recent Results (from the past 240 hour(s))  MRSA PCR Screening     Status: None   Collection Time: 04/28/17  8:08 AM  Result Value Ref Range Status   MRSA by PCR NEGATIVE NEGATIVE Final    Comment:        The GeneXpert MRSA Assay (FDA approved for NASAL specimens only), is one component of  a comprehensive MRSA colonization surveillance program. It is not intended to diagnose MRSA infection nor to guide or monitor treatment for MRSA infections.      Studies: No results found.  Scheduled Meds: . digoxin  0.125 mg Oral Daily   Continuous Infusions: . heparin      Principal Problem:   Rectal bleeding Active Problems:   Nonischemic cardiomyopathy (HCC)   Atrial fibrillation (HCC)   Sick sinus syndrome (HCC)   Systolic CHF, chronic (HCC)   S/P Mechanical MVR (mitral valve replacement)   Dementia   ICD (implantable cardioverter-defibrillator), biventricular, in situ   Acute blood loss anemia    Time spent: 25 minutes    Barton Dubois  [facility] Pager [phone]. If 7PM-7AM, please contact night-coverage at [email or web address], password [facility] 04/30/2017, 3:13 PM  LOS: 2 days

## 2017-04-30 NOTE — Progress Notes (Signed)
ANTICOAGULATION CONSULT NOTE - Initial Consult  Pharmacy Consult for Heparin Indication: mechanical mitral valve  No Known Allergies  Patient Measurements: Height: 5\' 7"  (170.2 cm) Weight: 140 lb 11.2 oz (63.8 kg) IBW/kg (Calculated) : 61.6 Heparin Dosing Weight: 63.8 kg  Vital Signs: Temp: 98.4 F (36.9 C) (09/05 1300) Temp Source: Axillary (09/05 1300) BP: 100/51 (09/05 1300) Pulse Rate: 79 (09/05 1300)  Labs:  Recent Labs  04/28/17 0245  04/28/17 0851  04/29/17 0002 04/29/17 1620 04/30/17 0716  HGB 12.4  < > 10.6*  < > 8.3* 9.3* 8.2*  HCT 38.9  < > 33.5*  < > 25.7* 29.2* 25.2*  PLT 148*  < > 144*  < > 113* 134* 113*  LABPROT 41.1*  --  23.2*  --  18.6*  --   --   INR 4.32*  --  2.08  --  1.57  --   --   CREATININE 1.13*  --   --   --   --   --   --   < > = values in this interval not displayed.  Estimated Creatinine Clearance: 43.1 mL/min (A) (by C-G formula based on SCr of 1.13 mg/dL (H)).   Medical History: Past Medical History:  Diagnosis Date  . Abnormality of gait 08/23/2014  . Arthritis   . Atrial fibrillation ([place])    A.  Chronic Coumadin  . Cardiac arrest - ventricular fibrillation    A.  12/2007;  B. 01/2008 [employer] Jude Promote Bi-V ICD placed  . CVA (cerebral vascular accident) ([place])   . Diverticular disease   . DVT (deep venous thrombosis) ([place])   . 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 History of colon cancer    A.  1999 - T3, N1  chemotherapy  . Internal hemorrhoids   . Memory deficit 08/23/2014  . Nonischemic cardiomyopathy ([place])    a.  11/2001 - Cath - NL Cors; b. ECHO 09/22/11: EF 20%, MVR normal, moderate LAE, mild RAE c. ECHO (08/2012): ED 20%, diff HK, LA mod dilated, mild/mod TR, RV mild/mod decreased sys fx  . Rectal bleeding 04/28/2017  . Rheumatic heart disease    A. 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 Sick sinus syndrome ([place])    A.  s/p pacer in 1995.  B.    . Systolic CHF,  chronic (HCC)    A.  01/2008 Echo - EF 10-20%   Assessment:  71 yr old female on Coumadin prior to admission for hx mechanical mitral valve, afid, and DVT.  Admitted on 9.3 with bloody stools. INR was 4.3. Coumadin reversed with Vitamin K 5 mg IV; INR down to 1.57 yesterday.  Bleeding has resolved; thought due to diverticulitis. Anticoagulation to resume conservatively with IV heparin. No boluses, low therapeutic goal.   Home Coumadin regimen: 8 mg daily except 9 mg on Mondays. Last dose 04/27/17.   Goal of Therapy:  Heparin level 0.3-0.5 units/ml, low end of therapeutic range Monitor platelets by anticoagulation protocol: Yes   Plan:   Begin Heparin drip at 750 units/hr (~12 units/kg/hr)  No heparin boluses; target low therapeutic heparin levels.  Heparin level ~6 hrs after drip begins.  Daily heparin level, PT/INR and CBC.  Discussed with patient; she will report any bleeding.  Follow up for resuming Coumadin in the next few days.  Arty Baumgartner, [place] Pager: [phone] 04/30/2017,3:01 PM

## 2017-05-01 DIAGNOSIS — E876 Hypokalemia: Secondary | ICD-10-CM

## 2017-05-01 LAB — HEPARIN LEVEL (UNFRACTIONATED)
Heparin Unfractionated: 0.43 IU/mL (ref 0.30–0.70)
Heparin Unfractionated: 0.79 IU/mL — ABNORMAL HIGH (ref 0.30–0.70)
Heparin Unfractionated: 1.3 IU/mL — ABNORMAL HIGH (ref 0.30–0.70)

## 2017-05-01 LAB — BASIC METABOLIC PANEL
Anion gap: 7 (ref 5–15)
BUN: 8 mg/dL (ref 6–20)
CO2: 21 mmol/L — ABNORMAL LOW (ref 22–32)
Calcium: 8.3 mg/dL — ABNORMAL LOW (ref 8.9–10.3)
Chloride: 112 mmol/L — ABNORMAL HIGH (ref 101–111)
Creatinine, Ser: 0.78 mg/dL (ref 0.44–1.00)
GFR calc Af Amer: >60 mL/min (ref >60)
GFR calc non Af Amer: >60 mL/min (ref >60)
Glucose, Bld: 95 mg/dL (ref 65–99)
Potassium: 3.3 mmol/L — ABNORMAL LOW (ref 3.5–5.1)
Sodium: 140 mmol/L (ref 135–145)

## 2017-05-01 LAB — CBC
HCT: 24.4 % — ABNORMAL LOW (ref 36.0–46.0)
Hemoglobin: 7.9 g/dL — ABNORMAL LOW (ref 12.0–15.0)
MCH: 29.8 pg (ref 26.0–34.0)
MCHC: 32.4 g/dL (ref 30.0–36.0)
MCV: 92.1 fL (ref 78.0–100.0)
Platelets: 115 10*3/uL — ABNORMAL LOW (ref 150–400)
RBC: 2.65 MIL/uL — ABNORMAL LOW (ref 3.87–5.11)
RDW: 16.3 % — ABNORMAL HIGH (ref 11.5–15.5)
WBC: 4.6 10*3/uL (ref 4.0–10.5)

## 2017-05-01 LAB — PREPARE RBC (CROSSMATCH)

## 2017-05-01 LAB — GLUCOSE, CAPILLARY
Glucose-Capillary: 91 mg/dL (ref 65–99)
Glucose-Capillary: 96 mg/dL (ref 65–99)
Glucose-Capillary: 127 mg/dL — ABNORMAL HIGH (ref 65–99)
Glucose-Capillary: 120 mg/dL — ABNORMAL HIGH (ref 65–99)

## 2017-05-01 LAB — PROTIME-INR
INR: 1.34
Prothrombin Time: 16.4 seconds — ABNORMAL HIGH (ref 11.4–15.2)

## 2017-05-01 LAB — MAGNESIUM: Magnesium: 1.7 mg/dL (ref 1.7–2.4)

## 2017-05-01 MED ORDER — POTASSIUM CHLORIDE CRYS ER 20 MEQ PO TBCR: 40.0000 meq | EXTENDED_RELEASE_TABLET | Freq: Once | ORAL | Status: DC

## 2017-05-01 MED ORDER — POTASSIUM CHLORIDE CRYS ER 20 MEQ PO TBCR
40.0000 meq | EXTENDED_RELEASE_TABLET | ORAL | Status: AC
  Administered 2017-05-01 (×2): 40 meq via ORAL
  Filled 2017-05-01 (×3): qty 2

## 2017-05-01 MED ORDER — POTASSIUM CHLORIDE CRYS ER 10 MEQ PO TBCR
EXTENDED_RELEASE_TABLET | ORAL | Status: AC
  Filled 2017-05-01: qty 4

## 2017-05-01 MED ORDER — SODIUM CHLORIDE 0.9 % IV SOLN
Freq: Once | INTRAVENOUS | Status: AC
  Administered 2017-05-01: 13:00:00 via INTRAVENOUS

## 2017-05-01 NOTE — Progress Notes (Addendum)
[facility] PROGRESS NOTE  Angela [name] ENI:[id number] DOB: 06-08-1946 DOA: 04/28/2017 PCP: Lorene Dy, MD  Interim summary and HPI 71 y.o. female with history of nonischemic cardiomyopathy status post ICD placement, atrial fibrillation, mechanical mitral valve, colon cancer in remission presents to the ER the patient had a large bloody bowel movement at midnight. Patient states that she woke up in the middle of the night to go to the bathroom and had a large bloody bowel which was painless. Had mild dizziness denies any chest pain or shortness of breath. Denies any abdominal pain nausea vomiting.  Assessment/Plan: 1-GI bleed: presumed to be lower GI bleed. -GI service discussed with husband and the decision was made for no invasive work up or therapy  -no further blood in stools, appreciated; but Hgb has trended down to 7.9 -will continue holding coumadin, and after discussing with GI; they feel patient will benefit of palliative care consult to determine advance care directives and code status. Patient with very high thromboembolic risk off anticoagulation; but at the same time demonstrating/struggling GIB. -will continue heparin for now -follow Hgb trend   2-A. Fib and mechanical valve -will continue heparin -continue low dose b-blocker -continue telemetry monitoring  -continue digoxin  3-non-ischemic cardiomyopathy/sick sinus syndrome   -s/p ICD -follow daily weights and strict I's/O's -no signs of fluid overload  -continue low dose B-blocker   4-dementia -poor overall insight  -no agitation -will continue supportive care -discussed with patient's husband about need for further directives on treatment and in agreement to have encounter with palliative care.  5-acute blood loss anemia -due to GI bleed -Hgb down to 7.9; from 12.4 -will transfuse one unit of PRBC -follow Hgb trend  -patient currently on heparin drip.   6-hx of colon cancer  -appears to be in  remission; but family declining any further colonoscopy or endoscopic procedure at this time. -will ask palliative care to meet with patient and discussed [place] and advance directives.   Code Status: Full Family Communication: husband at bedside  Disposition Plan: to be determined. Will continue patient on heparin; will transfuse one unit of PRBC; follow Hgb trend and ask palliative care to meet with family and patient for [place] and advance care planning.    Consultants: GI Palliative Care  Procedures:  None   Antibiotics:  None   HPI/Subjective: Afebrile, no CP, no SOB. Denies nausea, vomiting and acute hematochezia. Patient feeling weak and pale on exam.  Objective: Vitals:   05/01/17 0911 05/01/17 1122  BP: (!) 98/54 (!) 92/59  Pulse:    Resp: (!) 25 (!) 22  Temp:    SpO2:      Intake/Output Summary (Last 24 hours) at 05/01/17 1230 Last data filed at 05/01/17 0500  Gross per 24 hour  Intake           271.37 ml  Output              400 ml  Net          -128.63 ml   Filed Weights   04/29/17 0551 04/30/17 0448 05/01/17 0434  Weight: 64.6 kg (142 lb 6.4 oz) 63.8 kg (140 lb 11.2 oz) 63.2 kg (139 lb 6.4 oz)    Exam:   General: afebrile, no CP, no abd pain. No nausea, no vomiting and reported no further blood seen in her stools. Patient oriented X 2, no agitation.   Cardiovascular: rate controlled, no rubs, no gallops, positive metallic click and soft SEM.  Respiratory: good  air movement, no wheezing, no crackles.  Abdomen: soft, NT, ND, positive BS.  Musculoskeletal: no edema, no cyanosis, no clubbing   Data Reviewed: Basic Metabolic Panel:  Recent Labs Lab 04/28/17 0245 05/01/17 0408  NA 141 140  K 4.0 3.3*  CL 110 112*  CO2 22 21*  GLUCOSE 123* 95  BUN 21* 8  CREATININE 1.13* 0.78  CALCIUM 9.1 8.3*  MG  --  1.7   Liver Function Tests:  Recent Labs Lab 04/28/17 0245  AST 21  ALT 15  ALKPHOS 42  BILITOT 0.6  PROT 6.0*  ALBUMIN 3.5    CBC:  Recent Labs Lab 04/28/17 0245  04/28/17 1709 04/29/17 0002 04/29/17 1620 04/30/17 0716 05/01/17 0408  WBC 5.0  < > 7.0 5.2 5.5 4.6 4.6  NEUTROABS 2.4  --   --   --   --   --   --   HGB 12.4  < > 9.2* 8.3* 9.3* 8.2* 7.9*  HCT 38.9  < > 28.3* 25.7* 29.2* 25.2* 24.4*  MCV 92.0  < > 90.1 91.1 92.7 91.0 92.1  PLT 148*  < > 121* 113* 134* 113* 115*  < > = values in this interval not displayed.   CBG:  Recent Labs Lab 04/30/17 1206 04/30/17 1639 04/30/17 2053 05/01/17 0730 05/01/17 1116  GLUCAP 96 108* 118* 91 96    Recent Results (from the past 240 hour(s))  MRSA PCR Screening     Status: None   Collection Time: 04/28/17  8:08 AM  Result Value Ref Range Status   MRSA by PCR NEGATIVE NEGATIVE Final    Comment:        The GeneXpert MRSA Assay (FDA approved for NASAL specimens only), is one component of a comprehensive MRSA colonization surveillance program. It is not intended to diagnose MRSA infection nor to guide or monitor treatment for MRSA infections.      Studies: No results found.  Scheduled Meds: . carvedilol  3.125 mg Oral BID WC  . digoxin  0.125 mg Oral Daily  . potassium chloride  40 mEq Oral Q4H   Continuous Infusions: . sodium chloride    . heparin 650 Units/hr (05/01/17 0911)    Principal Problem:   Rectal bleeding Active Problems:   Nonischemic cardiomyopathy (HCC)   Atrial fibrillation (HCC)   Sick sinus syndrome (HCC)   Systolic CHF, chronic (HCC)   S/P Mechanical MVR (mitral valve replacement)   Dementia   ICD (implantable cardioverter-defibrillator), biventricular, in situ   Acute blood loss anemia    Time spent: 25 minutes    Barton Dubois  [facility] Pager [phone]. If 7PM-7AM, please contact night-coverage at [email or web address], password [facility] 05/01/2017, 12:30 PM  LOS: 3 days

## 2017-05-01 NOTE — Progress Notes (Signed)
ANTICOAGULATION CONSULT NOTE - F/u Consult  Pharmacy Consult for Heparin Indication: mechanical mitral valve  No Known Allergies  Patient Measurements: Height: 5\' 7"  (170.2 cm) Weight: 139 lb 6.4 oz (63.2 kg) IBW/kg (Calculated) : 61.6 Heparin Dosing Weight: 63.8 kg  Assessment: 71 yr old female on Coumadin prior to admission for hx mechanical mitral valve, afib, and DVT.  Admitted with bloody stools and elevated INR; now resolved. Pharmacy consulted to dose heparin while warfarin on hold.  Heparin level increased to 1.3 even after rate decrease earlier today. Hgb trending down and reported bloody BM today.  Goal of Therapy:  Heparin level 0.3-0.5 units/ml, low end of therapeutic range Monitor platelets by anticoagulation protocol: Yes   Plan:  Decrease heparin gtt to 500 units/hr Monitor daily heparin level, CBC, s/s of bleed  Elenor Quinones, PharmD, [facility] Clinical Pharmacist Pager [phone] 05/01/2017 6:26 PM

## 2017-05-01 NOTE — Progress Notes (Signed)
ANTICOAGULATION CONSULT NOTE - F/u Consult  Pharmacy Consult for Heparin Indication: mechanical mitral valve  No Known Allergies  Patient Measurements: Height: 5\' 7"  (170.2 cm) Weight: 140 lb 11.2 oz (63.8 kg) IBW/kg (Calculated) : 61.6 Heparin Dosing Weight: 63.8 kg  Vital Signs: Temp: 98.2 F (36.8 C) (09/05 2050) BP: 118/69 (09/05 2050) Pulse Rate: 78 (09/05 2050)  Labs:  Recent Labs  04/28/17 0245  04/28/17 0851  04/29/17 0002 04/29/17 1620 04/30/17 0716 05/01/17 0018  HGB 12.4  < > 10.6*  < > 8.3* 9.3* 8.2*  --   HCT 38.9  < > 33.5*  < > 25.7* 29.2* 25.2*  --   PLT 148*  < > 144*  < > 113* 134* 113*  --   LABPROT 41.1*  --  23.2*  --  18.6*  --   --   --   INR 4.32*  --  2.08  --  1.57  --   --   --   HEPARINUNFRC  --   --   --   --   --   --   --  0.43  CREATININE 1.13*  --   --   --   --   --   --   --   < > = values in this interval not displayed.  Estimated Creatinine Clearance: 43.1 mL/min (A) (by C-G formula based on SCr of 1.13 mg/dL (H)).   Medical History: Past Medical History:  Diagnosis Date  . Abnormality of gait 08/23/2014  . Arthritis   . Atrial fibrillation ([place])    A.  Chronic Coumadin  . Cardiac arrest - ventricular fibrillation    A.  12/2007;  B. 01/2008 St. Jude Promote Bi-V ICD placed  . CVA (cerebral vascular accident) ([place])   . Diverticular disease   . DVT (deep venous thrombosis) ([place])   . 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 History of colon cancer    A.  1999 - T3, N1  chemotherapy  . Internal hemorrhoids   . Memory deficit 08/23/2014  . Nonischemic cardiomyopathy ([place])    a.  11/2001 - Cath - NL Cors; b. ECHO 09/22/11: EF 20%, MVR normal, moderate LAE, mild RAE c. ECHO (08/2012): ED 20%, diff HK, LA mod dilated, mild/mod TR, RV mild/mod decreased sys fx  . Rectal bleeding 04/28/2017  . Rheumatic heart disease    A. 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 Sick sinus  syndrome ([place])    A.  s/p pacer in 1995.  B.    . Systolic CHF, chronic ([place])    A.  01/2008 Echo - EF 10-20%   Assessment: 71 yr old female on Coumadin prior to admission for hx mechanical mitral valve, afib, and DVT.  Admitted with bloody stools and elevated INR; now resolved. Pharmacy consulted to dose heparin while warfarin on hold.  Heparin level is therapeutic at 0.43. CBC is low stable and no new s/s bleeding noted.   Home Coumadin regimen: 8 mg daily except 9 mg on Mondays. Last dose 04/27/17.  Goal of Therapy:  Heparin level 0.3-0.5 units/ml, low end of therapeutic range Monitor platelets by anticoagulation protocol: Yes   Plan:  Continue heparin gtt at 750 units/hr Confirm heparin level in 8 hrs Daily heparin level and CBC Monitor for s/s bleeding F/u long-term AC plan   Argie Ramming, PharmD Clinical Pharmacist 05/01/17 1:08 AM

## 2017-05-01 NOTE — Progress Notes (Signed)
ANTICOAGULATION CONSULT NOTE - F/u Consult  Pharmacy Consult for Heparin Indication: mechanical mitral valve  No Known Allergies  Patient Measurements: Height: 5\' 7"  (170.2 cm) Weight: 139 lb 6.4 oz (63.2 kg) IBW/kg (Calculated) : 61.6 Heparin Dosing Weight: 63.8 kg  Vital Signs: Temp: 98.1 F (36.7 C) (09/06 0434) BP: 128/58 (09/06 0434) Pulse Rate: 90 (09/06 0434)  Labs:  Recent Labs  04/29/17 0002 04/29/17 1620 04/30/17 0716 05/01/17 0018 05/01/17 0408 05/01/17 0804  HGB 8.3* 9.3* 8.2*  --  7.9*  --   HCT 25.7* 29.2* 25.2*  --  24.4*  --   PLT 113* 134* 113*  --  115*  --   LABPROT 18.6*  --   --   --  16.4*  --   INR 1.57  --   --   --  1.34  --   HEPARINUNFRC  --   --   --  0.43  --  0.79*  CREATININE  --   --   --   --  0.78  --     Estimated Creatinine Clearance: 60.9 mL/min (by C-G formula based on SCr of 0.78 mg/dL).   Medical History: Past Medical History:  Diagnosis Date  . Abnormality of gait 08/23/2014  . Arthritis   . Atrial fibrillation ([place])    A.  Chronic Coumadin  . Cardiac arrest - ventricular fibrillation    A.  12/2007;  B. 01/2008 St. Jude Promote Bi-V ICD placed  . CVA (cerebral vascular accident) ([place])   . Diverticular disease   . DVT (deep venous thrombosis) ([place])   . 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 History of colon cancer    A.  1999 - T3, N1  chemotherapy  . Internal hemorrhoids   . Memory deficit 08/23/2014  . Nonischemic cardiomyopathy ([place])    a.  11/2001 - Cath - NL Cors; b. ECHO 09/22/11: EF 20%, MVR normal, moderate LAE, mild RAE c. ECHO (08/2012): ED 20%, diff HK, LA mod dilated, mild/mod TR, RV mild/mod decreased sys fx  . Rectal bleeding 04/28/2017  . Rheumatic heart disease    A. 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 Sick sinus syndrome ([place])    A.  s/p pacer in 1995.  B.    . Systolic CHF, chronic ([place])    A.  01/2008 Echo - EF 10-20%   Assessment: 71  yr old female on Coumadin prior to admission for hx mechanical mitral valve, afib, and DVT.  Admitted with bloody stools and elevated INR; now resolved. Pharmacy consulted to dose heparin while warfarin on hold.  Heparin level is supratherapeutic at 0.79. CBC is low stable and no new s/s bleeding noted.   Home Coumadin regimen: 8 mg daily except 9 mg on Mondays. Last dose 04/27/17. Per notes continuing to hold warfarin right now.  Goal of Therapy:  Heparin level 0.3-0.5 units/ml, low end of therapeutic range Monitor platelets by anticoagulation protocol: Yes   Plan:  Decrease heparin gtt at 650 units/hr Repeat heparin level in 6 hrs Daily heparin level and CBC Monitor for s/s bleeding F/u long-term [facility] plan   Doylene Canard, PharmD Clinical Pharmacist  Phone: [phone] 05/01/17 9:03 AM

## 2017-05-01 NOTE — Progress Notes (Signed)
Daily Rounding Note  05/01/2017, 9:03 AM  LOS: 3 days   SUBJECTIVE:   Chief complaint: denies abdominal pain or weakness.   Eating 50% of HH meals     2 BMs charted yesterday, but no descriptor.    OBJECTIVE:         Vital signs in last 24 hours:    Temp:  [98.1 F (36.7 C)-98.4 F (36.9 C)] 98.1 F (36.7 C) (09/06 0434) Pulse Rate:  [78-90] 90 (09/06 0434) Resp:  [16-28] 16 (09/06 0434) BP: (100-128)/(51-69) 128/58 (09/06 0434) SpO2:  [100 %] 100 % (09/06 0434) Weight:  [63.2 kg (139 lb 6.4 oz)] 63.2 kg (139 lb 6.4 oz) (09/06 0434) Last BM Date: 04/29/17 Filed Weights   04/29/17 0551 04/30/17 0448 05/01/17 0434  Weight: 64.6 kg (142 lb 6.4 oz) 63.8 kg (140 lb 11.2 oz) 63.2 kg (139 lb 6.4 oz)   General: pleasant, alert.  NAD   Heart: irreg, irreg with mechanical valve snap Chest: clear bil.  No labored breathing Abdomen: soft, NT, ND.  No HSM or mass.  Active BS  Rectal:  No stool palpable, dark smear of stool on glove: looks FOBT + but did not assay for blod.  Extremities: no CCE.  Surveyed limbs, torso and find no significant purpura or hematomw Neuro/Psych:  Oriented only to self.  Cooperative, follows commands, ambulated with assistance.  Not dizzy getting up and walking to bathroom.    Intake/Output from previous day: 09/05 0701 - 09/06 0700 In: 391.4 [P.O.:300; I.V.:91.4] Out: 600 [Urine:600]  Intake/Output this shift: No intake/output data recorded.  Lab Results:  Recent Labs  04/29/17 1620 04/30/17 0716 05/01/17 0408  WBC 5.5 4.6 4.6  HGB 9.3* 8.2* 7.9*  HCT 29.2* 25.2* 24.4*  PLT 134* 113* 115*   BMET  Recent Labs  05/01/17 0408  NA 140  K 3.3*  CL 112*  CO2 21*  GLUCOSE 95  BUN 8  CREATININE 0.78  CALCIUM 8.3*   LFT No results for input(s): PROT, ALBUMIN, AST, ALT, ALKPHOS, BILITOT, BILIDIR, IBILI in the last 72 hours. PT/INR  Recent Labs  04/29/17 0002 05/01/17 0408    LABPROT 18.6* 16.4*  INR 1.57 1.34   Hepatitis Panel No results for input(s): HEPBSAG, HCVAB, HEPAIGM, HEPBIGM in the last 72 hours.  Studies/Results: No results found.   Scheduled Meds: . carvedilol  3.125 mg Oral BID WC  . digoxin  0.125 mg Oral Daily  . potassium chloride  40 mEq Oral Q4H   Continuous Infusions: . heparin 750 Units/hr (04/30/17 1649)   PRN Meds:.acetaminophen **OR** acetaminophen, ondansetron **OR** ondansetron (ZOFRAN) IV, tiZANidine   ASSESMENT:   *  Painless hematochezia.  Suspect diverticular bleed.  Overt bleeding has stopped yet anemia worsening.   Colectomy for cancer 1999.  No recurrent polyps on colonoscopies thru 2010.  No worrisome findings on virtual colonoscopy 2015.  All studies confirm diverticulosis which is likely source of bleeding.    *  Anemia.  Hgb 12.4 >> 11 >> 9 >> 8.2 >> 7.9.    *  Thrombocytopenia.    *  Dementia   *  Hypokalemia.  Mild.   *  Chronic Coumadin on hold, INR 4.3 >> 1.5 >> 1.3 after Vitamin K.  Heparin gtt started 9/5.     *  Hx CHF.  Currently not decompensated, suspect EF has improved from 20% noted in 2014.     PLAN   *  Continue conservative mgt with observation and avoidance of colonoscopy.    *  Continue Heparin.  Dr Dyann Kief is going to transfuse 1 U PRBC today.  CBC in AM.      Azucena Freed  05/01/2017, 9:03 AM Pager: [phone]   Attending physician's note   I have taken an interval history, reviewed the chart and examined the patient. I agree with the Advanced Practitioner's note, impression and recommendations.   No further episodes of hematochezia. Hgb slightly trended down form 8.2--->7.9 Patient and her husband do not want to pursue any aggressive measures and want to avoid colonoscopy. We can hold off colonoscopy given patient is no longer having episodes of hematochezia. Husband also wanted to know if she can come off long term anticoagulation. She has higher thrombotic risk with mechanical  mitral valve, severe CHF, history of DVT and Afib. Discussed with Dr Dyann Kief and he mentioned that he had similar discussion with patient's husband and has requested palliative care consult. Continue Conservative management. Will sign off, available for any questions  K Denzil Magnuson, MD [phone] Mon-Fri 8a-5p [phone] after 5p, weekends, holidays

## 2017-05-02 DIAGNOSIS — Z515 Encounter for palliative care: Secondary | ICD-10-CM

## 2017-05-02 LAB — CBC
HCT: 26.8 % — ABNORMAL LOW (ref 36.0–46.0)
Hemoglobin: 8.8 g/dL — ABNORMAL LOW (ref 12.0–15.0)
MCH: 30.3 pg (ref 26.0–34.0)
MCHC: 32.8 g/dL (ref 30.0–36.0)
MCV: 92.4 fL (ref 78.0–100.0)
Platelets: 110 10*3/uL — ABNORMAL LOW (ref 150–400)
RBC: 2.9 MIL/uL — ABNORMAL LOW (ref 3.87–5.11)
RDW: 16.3 % — ABNORMAL HIGH (ref 11.5–15.5)
WBC: 5.8 10*3/uL (ref 4.0–10.5)

## 2017-05-02 LAB — HEPARIN LEVEL (UNFRACTIONATED)
Heparin Unfractionated: 0.29 IU/mL — ABNORMAL LOW (ref 0.30–0.70)
Heparin Unfractionated: 0.41 IU/mL (ref 0.30–0.70)

## 2017-05-02 LAB — BPAM RBC
Blood Product Expiration Date: 201809232359
Blood Product Expiration Date: 201809252359
ISSUE DATE / TIME: 201809061624
Unit Type and Rh: 6200
Unit Type and Rh: 6200

## 2017-05-02 LAB — TYPE AND SCREEN
ABO/RH(D): A POS
Antibody Screen: NEGATIVE
Donor AG Type: NEGATIVE
Donor AG Type: NEGATIVE
Unit division: 0
Unit division: 0

## 2017-05-02 LAB — GLUCOSE, CAPILLARY
Glucose-Capillary: 113 mg/dL — ABNORMAL HIGH (ref 65–99)
Glucose-Capillary: 97 mg/dL (ref 65–99)
Glucose-Capillary: 97 mg/dL (ref 65–99)
Glucose-Capillary: 99 mg/dL (ref 65–99)

## 2017-05-02 LAB — PROTIME-INR
INR: 1.28
Prothrombin Time: 15.9 seconds — ABNORMAL HIGH (ref 11.4–15.2)

## 2017-05-02 MED ORDER — WARFARIN SODIUM 10 MG PO TABS
10.0000 mg | ORAL_TABLET | Freq: Once | ORAL | Status: AC
  Administered 2017-05-02: 10 mg via ORAL
  Filled 2017-05-02: qty 1

## 2017-05-02 MED ORDER — WARFARIN - PHARMACIST DOSING INPATIENT
Freq: Every day | Status: DC
  Administered 2017-05-05 – 2017-05-07 (×3)

## 2017-05-02 NOTE — Progress Notes (Signed)
ANTICOAGULATION CONSULT NOTE - F/u Consult  Pharmacy Consult for Heparin Indication: mechanical mitral valve  No Known Allergies  Patient Measurements: Height: 5\' 7"  (170.2 cm) Weight: 139 lb 6.4 oz (63.2 kg) IBW/kg (Calculated) : 61.6 Heparin Dosing Weight: 63.8 kg  Assessment: 71 yr old female on Coumadin prior to admission for hx mechanical mitral valve, afib, and DVT.  Admitted with bloody stools and elevated INR; bloody stools again x1 on 9/6. Pharmacy consulted to dose heparin while warfarin on hold.  Heparin level slightly subtherapeutic at 0.29 with no infusion issues per RN. CBC stable s/p 1 unit PRBC and no new s/s bleeding per RN.   Will increase gtt very conservatively in setting of recent bleeding.   Goal of Therapy:  Heparin level 0.3-0.5 units/ml, low end of therapeutic range Monitor platelets by anticoagulation protocol: Yes   Plan:  Increase heparin gtt to 550 units/hr Heparin level in 8 hours Daily heparin level and CBC  Monitor for s/s bleeding   Argie Ramming, PharmD Clinical Pharmacist 05/02/17 1:51 AM

## 2017-05-02 NOTE — Progress Notes (Signed)
ANTICOAGULATION CONSULT NOTE - Initial Consult  Pharmacy Consult for heparin to warfarin Indication: mechanical mitral valve  No Known Allergies  Patient Measurements: Height: 5\' 7"  (170.2 cm) Weight: 139 lb 1.6 oz (63.1 kg) IBW/kg (Calculated) : 61.6 Heparin Dosing Weight: 63.8 kg  Vital Signs: Temp: 98.2 F (36.8 C) (09/07 1356) Temp Source: Oral (09/07 1356) BP: 90/57 (09/07 1356) Pulse Rate: 77 (09/07 1356)  Labs:  Recent Labs  04/30/17 0716  05/01/17 0408  05/01/17 1527 05/02/17 0056 05/02/17 0939  HGB 8.2*  --  7.9*  --   --  8.8*  --   HCT 25.2*  --  24.4*  --   --  26.8*  --   PLT 113*  --  115*  --   --  110*  --   LABPROT  --   --  16.4*  --   --  15.9*  --   INR  --   --  1.34  --   --  1.28  --   HEPARINUNFRC  --   < >  --   < > 1.30* 0.29* 0.41  CREATININE  --   --  0.78  --   --   --   --   < > = values in this interval not displayed.  Estimated Creatinine Clearance: 60.9 mL/min (by C-G formula based on SCr of 0.78 mg/dL).   Medical History: Past Medical History:  Diagnosis Date  . Abnormality of gait 08/23/2014  . Arthritis   . Atrial fibrillation ([place])    A.  Chronic Coumadin  . Cardiac arrest - ventricular fibrillation    A.  12/2007;  B. 01/2008 St. Jude Promote Bi-V ICD placed  . CVA (cerebral vascular accident) ([place])   . Diverticular disease   . DVT (deep venous thrombosis) ([place])   . 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 History of colon cancer    A.  1999 - T3, N1  chemotherapy  . Internal hemorrhoids   . Memory deficit 08/23/2014  . Nonischemic cardiomyopathy ([place])    a.  11/2001 - Cath - NL Cors; b. ECHO 09/22/11: EF 20%, MVR normal, moderate LAE, mild RAE c. ECHO (08/2012): ED 20%, diff HK, LA mod dilated, mild/mod TR, RV mild/mod decreased sys fx  . Rectal bleeding 04/28/2017  . Rheumatic heart disease    A. 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 Sick sinus syndrome ([place])    A.  s/p pacer in 1995.  B.    . Systolic CHF, chronic ([place])    A.  01/2008 Echo - EF 10-20%    Medications:  Prescriptions Prior to Admission  Medication Sig Dispense Refill Last Dose  . alendronate (FOSAMAX) 70 MG tablet Take 70 mg by mouth once a week. Take with a full glass of water on an empty stomach.   Past Month at Unknown time  . carvedilol (COREG) 3.125 MG tablet TAKE ONE (1) TABLET BY MOUTH TWO (2) TIMES DAILY WITH A MEAL 60 tablet 11 04/27/2017 at 1700  . cholecalciferol (VITAMIN D) 1000 UNITS tablet Take 2,000 Units by mouth daily.   04/27/2017 at Unknown time  . digoxin (LANOXIN) 0.125 MG tablet Take 0.125 mg by mouth daily.   04/27/2017 at Unknown time  . furosemide (LASIX) 40 MG tablet TAKE ONE (1) TABLET BY MOUTH EVERY DAY. IF WEIGHT IS GREATER THAN 140LBS ON HOME SCALE, TAKE 2  TABLETS (80MG ) (Patient taking differently: Take 20 mg by mouth daily as needed for fluid. ) 60 tablet 3 Past Week at Unknown time  . meclizine (ANTIVERT) 25 MG tablet Take 25 mg by mouth 2 (two) times daily as needed for dizziness or nausea.   unk  . pentoxifylline (TRENTAL) 400 MG CR tablet Take 400 mg by mouth daily as needed (leg pain).    unk  . spironolactone (ALDACTONE) 25 MG tablet TAKE ONE-HALF (1/2) TABLET BY MOUTH DAILY 45 tablet 3 04/27/2017 at Unknown time  . tiZANidine (ZANAFLEX) 4 MG tablet Take 4 mg by mouth every 6 (six) hours as needed for muscle spasms.   unk  . warfarin (COUMADIN) 6 MG tablet Take 8-9 mg by mouth daily. 8 mg everyday except Monday take 9 mg.   04/27/2017 at 0700    Assessment: 71 yr old female on Coumadin prior to admission for hx mechanical mitral valve, afib, and DVT.  Admitted with bloody stools and elevated INR; bloody stools again x1 on 9/6.  Per GI conservative management only. Palliative care to see for goals of care.  Currently on heparin and now restarting warfarin. Last dose of warfarin was PTA on 9/2, INR on admission was 4.32, received vitamin K 5 mg on 9/3. Also  received PRBC x 1 on 9/6. Current INR 1.28.  PTA dose: warfarin 9 mg on Mondays and 8 mg on all other days  Goal of Therapy:  INR 2.5-2.7 Monitor platelets by anticoagulation protocol: Yes   Plan:  Give warfarin 10 mg po x 1 Monitor daily INR, CBC, clinical course, s/sx of bleed, PO intake, DDI  Thank you for allowing [place] to participate in this patients care.  Jens Som, PharmD Clinical phone for 05/02/2017 from 3:30-10:30p: [phone] If after 10:30p, please call main pharmacy at: [phone] 05/02/2017 7:33 PM

## 2017-05-02 NOTE — Care Management Important Message (Signed)
Important Message  Patient Details  Name: Angela [name] MRN: [id number] Date of Birth: [birth year]   Medicare Important Message Given:  Yes    [name] May 05/02/2017, 9:35 AM

## 2017-05-02 NOTE — Consult Note (Signed)
Consultation Note Date: 05/02/2017   Patient Name: Angela [name]  DOB: 1945/11/19  MRN: [id number]  Age / Sex: 71 y.o., female  PCP: Lorene Dy, MD Referring Physician: Barton Dubois, MD  Reason for Consultation: Establishing goals of care  HPI/Patient Profile: Angela [name] is a 71 y.o. female with history of nonischemic cardiomyopathy status post ICD placement, atrial fibrillation, mechanical mitral valve, colon cancer in remission who presented to the ER with a large bloody bowel movement. She has a history of diverticulosis. Her INR on admission was 4.32, and she was given vitamin K to lower INR.   She was evaluated by GI with recommendations to pursue conservative management and avoid colonoscopy. She is currently on a heparin drip. Question as to resumption of Coumadin due to mechanical valve given chance of bleeding And address goals of care   Clinical Assessment and Goals of Care: Angela [name] is sitting in bedside chair. She denies pain,nausea, or bloody stool. She sates she is afraid to have a colonoscopy because she does not want to be put to sleep. She has baseline dementia and is somewhat confused during conversation. She has a son and husband whom she lives with. She would like her husband present for a goals of care meeting.   During the meeting, attempted to discuss code status and patient/husband was unable to discuss. Concepts of code status discussed,  however husband perseverating on colonoscopy. Defined terms of full code as well as DO NOT RESUSCITATE. Hard Choices booklet given to patient and family as well as a MOST form to guide future conversations Husband appears to have limited insight into care.  It was discussed that colonoscopy would not require general anesthesia, and in that case, the husband would want the procedure if indicated. Per GI, colonoscopy is not indicated, that this is  likely a diverticular bleed and would recommend following hemoglobin, resuming anticoagulation. Patient's husband does appear to understand the risks with resolved resuming anticoagulation but also recognizing that patient is in a difficult position with her mechanical valve requiring anticoagulation  Husband is decision maker as patient has dementia.     SUMMARY OF RECOMMENDATIONS   Defined terms of full code and DO NOT RESUSCITATE for patient and family. Patient and family given Hard Choices for loving people booklet as well as a most form as a template to guide future discussions Recommend outpatient palliative follow-up to continue goals of care conversation in the home Family aware of risks versus with anticoagulation but also aware of the necessity and are willing to resume and defer on colonoscopy if not helpful at this time. It is noted however that patient as well as patient husband's are not adverse to having a colonoscopy if it would help Goals important to family at this time is to return home Code Status/Advance Care Planning:  Full code    Additional Recommendations (Limitations, Scope, Preferences): Full scope of treatment  Prognosis:   > 12 months Depending on resumption of Coumadin and therapeutic INR.   Discharge Planning: Home  Primary Diagnoses: Present on Admission: . Rectal bleeding . Systolic CHF, chronic ([place]) . Sick sinus syndrome ([place]) . ICD (implantable cardioverter-defibrillator), biventricular, in situ . Atrial fibrillation ([place]) . Dementia . Acute blood loss anemia   I have reviewed the medical record, interviewed the patient and family, and examined the patient. The following aspects are pertinent.  Past Medical History:  Diagnosis Date  . Abnormality of gait 08/23/2014  . Arthritis   . Atrial fibrillation ([place])    A.  Chronic Coumadin  . Cardiac arrest - ventricular fibrillation    A.  12/2007;  B. 01/2008 St. Jude Promote Bi-V ICD  placed  . CVA (cerebral vascular accident) ([place])   . Diverticular disease   . DVT (deep venous thrombosis) ([place])   . 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 History of colon cancer    A.  1999 - T3, N1  chemotherapy  . Internal hemorrhoids   . Memory deficit 08/23/2014  . Nonischemic cardiomyopathy ([place])    a.  11/2001 - Cath - NL Cors; b. ECHO 09/22/11: EF 20%, MVR normal, moderate LAE, mild RAE c. ECHO (08/2012): ED 20%, diff HK, LA mod dilated, mild/mod TR, RV mild/mod decreased sys fx  . Rectal bleeding 04/28/2017  . Rheumatic heart disease    A. 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 Sick sinus syndrome ([place])    A.  s/p pacer in 1995.  B.    . Systolic CHF, chronic ([place])    A.  01/2008 Echo - EF 10-20%   Social History   Social History  . Marital status: Married    Spouse name: N/A  . Number of children: 1  . Years of education: N/A   Occupational History  . Retired Retired   Social History Main Topics  . Smoking status: Never Smoker  . Smokeless tobacco: Never Used  . Alcohol use No  . Drug use: No  . Sexual activity: No   Other Topics Concern  . None   Social History Narrative   The patient is married and has 1 child.  Lives in [place] with husband.  She is retired from working at [employer].  She does not smoke or drink   Family History  Problem Relation Age of Onset  . Diabetes Mother 3  . Coronary artery disease Mother        s/p cabg  . Lung cancer Father 34       smoker  . Coronary artery disease Sister        s/p cabg   Scheduled Meds: . carvedilol  3.125 mg Oral BID WC  . digoxin  0.125 mg Oral Daily   Continuous Infusions: . heparin 550 Units/hr (05/02/17 0701)   PRN Meds:.acetaminophen **OR** acetaminophen, ondansetron **OR** ondansetron (ZOFRAN) IV, tiZANidine Medications Prior to Admission:  Prior to Admission medications   Medication Sig Start Date End Date Taking? Authorizing  Provider  alendronate (FOSAMAX) 70 MG tablet Take 70 mg by mouth once a week. Take with a full glass of water on an empty stomach.   Yes [provider]  carvedilol (COREG) 3.125 MG tablet TAKE ONE (1) TABLET BY MOUTH TWO (2) TIMES DAILY WITH A MEAL 03/03/17  Yes Evans Lance, MD  cholecalciferol (VITAMIN D) 1000 UNITS tablet Take 2,000 Units by mouth daily.   Yes [provider]  digoxin (LANOXIN) 0.125 MG tablet Take 0.125  mg by mouth daily.   Yes [provider]  furosemide (LASIX) 40 MG tablet TAKE ONE (1) TABLET BY MOUTH EVERY DAY. IF WEIGHT IS GREATER THAN 140LBS ON HOME SCALE, TAKE 2 TABLETS (80MG ) Patient taking differently: Take 20 mg by mouth daily as needed for fluid.  02/03/17  Yes Evans Lance, MD  meclizine (ANTIVERT) 25 MG tablet Take 25 mg by mouth 2 (two) times daily as needed for dizziness or nausea.   Yes [provider]  pentoxifylline (TRENTAL) 400 MG CR tablet Take 400 mg by mouth daily as needed (leg pain).    Yes [provider]  spironolactone (ALDACTONE) 25 MG tablet TAKE ONE-HALF (1/2) TABLET BY MOUTH DAILY 03/05/17  Yes Evans Lance, MD  tiZANidine (ZANAFLEX) 4 MG tablet Take 4 mg by mouth every 6 (six) hours as needed for muscle spasms.   Yes [provider]  warfarin (COUMADIN) 6 MG tablet Take 8-9 mg by mouth daily. 8 mg everyday except Monday take 9 mg.   Yes [provider]   No Known Allergies Review of Systems  Unable to perform ROS: Dementia  Constitutional: Positive for activity change.  Gastrointestinal: Negative for abdominal pain and nausea.    Physical Exam  Constitutional: No distress.  HENT:  Head: Normocephalic and atraumatic.  Eyes: EOM are normal.  Neck: Normal range of motion.  Cardiovascular:  Warm and dry  Pulmonary/Chest: Effort normal. No respiratory distress.  Musculoskeletal: Normal range of motion.  Neurological: She is alert.  Skin: Skin is warm and dry.    Psychiatric: She has a normal mood and affect. Her behavior is normal.  Patient has short-term memory deficits. She defers to her husband frequently in conversation. She understands that she came to the hospital because she is bleeding but is unable to really comprehend "the big picture"  Nursing note and vitals reviewed.   Vital Signs: BP (!) 107/49 (BP Location: Right Arm)   Pulse 74   Temp 97.9 F (36.6 C) (Oral)   Resp 19   Ht 5\' 7"  (1.702 m)   Wt 63.1 kg (139 lb 1.6 oz)   SpO2 100%   BMI 21.79 kg/m  Pain Assessment: No/denies pain POSS *See Group Information*: 1-Acceptable,Awake and alert Pain Score: 2    SpO2: SpO2: 100 % O2 Device:SpO2: 100 % O2 Flow Rate: .   IO: Intake/output summary:  Intake/Output Summary (Last 24 hours) at 05/02/17 1059 Last data filed at 05/02/17 0701  Gross per 24 hour  Intake           459.34 ml  Output              550 ml  Net           -90.66 ml    LBM: Last BM Date: 04/30/17 Baseline Weight: Weight: 64.6 kg (142 lb 6.4 oz) Most recent weight: Weight: 63.1 kg (139 lb 1.6 oz)     Palliative Assessment/Data: 40%     Time In: 1200 Time Out: 1320 Time Total: 80 min Greater than 50%  of this time was spent counseling and coordinating care related to the above assessment and plan. Discussed with Azucena Freed, PA-C with GI as well as Dr. Dyann Kief Signed by: Asencion Gowda, NP   Please contact Palliative Medicine Team phone at [phone] for questions and concerns.  For individual provider: See Shea Evans

## 2017-05-02 NOTE — Progress Notes (Signed)
Pt's Blood Pressure running low. SBP in 70's. MAP still >60. Notified Provider on call. Continue to monitor. Notify MD if SBP remains in 70's.

## 2017-05-02 NOTE — Progress Notes (Signed)
[facility] PROGRESS NOTE  Angela [name] BHA:[id number] DOB: 1945/09/07 DOA: 04/28/2017 PCP: Lorene Dy, MD  Interim summary and HPI 71 y.o. female with history of nonischemic cardiomyopathy status post ICD placement, atrial fibrillation, mechanical mitral valve, colon cancer in remission presents to the ER the patient had a large bloody bowel movement at midnight. Patient states that she woke up in the middle of the night to go to the bathroom and had a large bloody bowel which was painless. Had mild dizziness denies any chest pain or shortness of breath. Denies any abdominal pain nausea vomiting.  Assessment/Plan: 1-GI bleed: presumed to be lower GI bleed. -no further signs of acute bleeding appreciated. -Patient with very high thromboembolic risk off anticoagulation; but at the same time demonstrating/struggling with GIB. -will continue heparin and given no further bleeding will initiate coumadin bridging on 05/03/17 -continue to follow Hgb trend  -palliative care consultation unsuccessful and family not understanding big picture of patient's condition. -they have now expressed willing to pursuit colonoscopy if further bleeding experienced.  2-A. Fib and mechanical valve -will continue heparin and given no further signs of active bleeding will initiate Coumadin bridging on 05/03/2017 -continue low dose b-blocker and the use of digoxin for rate control. -continue telemetry monitoring  -CHADsVASC score 4  3-non-ischemic cardiomyopathy/sick sinus syndrome   -s/p ICD -follow daily weights and strict I's/O's -no signs of fluid overload  -continue low dose B-blocker  -Patient was soft blood pressure.  4-dementia -poor overall insight  -no agitation -will continue supportive care  5-acute blood loss anemia -due to GI bleed -Hgb down to 7.9; from 12.4; after receiving 1 unit of PRBC hemoglobin up to 8.8 -Will follow hemoglobin trend -patient currently on heparin drip.  -No  signs of further bleeding episode and will initiate's on 05/03/2017 Coumadin bridging.  6-hx of colon cancer  -appears to be in remission -Palliative care has met with family and also successful intervention to determine advance care planning and future goals of care.  -Family has expressed to the palliative care service that if the patient continued to experience dropping her hemoglobin or bleeding that they will like to pursuit colonoscopy.   Code Status: Full Family Communication: husband at bedside  Disposition Plan: to be determined. Will continue patient on heparin, a Coumadin bridging on 05/03/2017; ) follow-up to patient's hemoglobin trend. If there is any signs of further bleeding family has now agree for the patient to have colonoscopy. Discussion with palliative care unsuccessful patient remains full code and is no clear to the patient husband the need for future decisions at this moment.   Consultants: GI Palliative Care  Procedures:  None   Antibiotics:  None   HPI/Subjective: Patient afebrile, no chest pain, no shortness of breath; denies nausea and vomiting. Patient denies any melanotic stool or any bright red blood with her feces. Despite soft blood pressure has remained hemodynamically stable.  Objective: Vitals:   05/02/17 0847 05/02/17 1356  BP: (!) 107/49 (!) 90/57  Pulse: 74 77  Resp: 19   Temp: 97.9 F (36.6 C) 98.2 F (36.8 C)  SpO2: 100% 100%    Intake/Output Summary (Last 24 hours) at 05/02/17 1915 Last data filed at 05/02/17 0701  Gross per 24 hour  Intake            55.66 ml  Output              450 ml  Net          -394.34  ml   Filed Weights   04/30/17 0448 05/01/17 0434 05/01/17 2108  Weight: 63.8 kg (140 lb 11.2 oz) 63.2 kg (139 lb 6.4 oz) 63.1 kg (139 lb 1.6 oz)    Exam:   General: Oriented 2, no agitation, no chest pain, no shortness of breath, no abdominal pain. Patient also denies nausea vomiting. No signs of acute bleeding.     Cardiovascular: Rate controlled, no rubs, no gallops, positive metallic click and soft systolic ejection murmur appreciated on exam.   Respiratory: Good air movement bilaterally, no wheezing, no crackles.   Abdomen: Soft, nontender, nondistended, positive bowel sounds.  Musculoskeletal: No edema, no cyanosis, no clubbing.   Data Reviewed: Basic Metabolic Panel:  Recent Labs Lab 04/28/17 0245 05/01/17 0408  NA 141 140  K 4.0 3.3*  CL 110 112*  CO2 22 21*  GLUCOSE 123* 95  BUN 21* 8  CREATININE 1.13* 0.78  CALCIUM 9.1 8.3*  MG  --  1.7   Liver Function Tests:  Recent Labs Lab 04/28/17 0245  AST 21  ALT 15  ALKPHOS 42  BILITOT 0.6  PROT 6.0*  ALBUMIN 3.5   CBC:  Recent Labs Lab 04/28/17 0245  04/29/17 0002 04/29/17 1620 04/30/17 0716 05/01/17 0408 05/02/17 0056  WBC 5.0  < > 5.2 5.5 4.6 4.6 5.8  NEUTROABS 2.4  --   --   --   --   --   --   HGB 12.4  < > 8.3* 9.3* 8.2* 7.9* 8.8*  HCT 38.9  < > 25.7* 29.2* 25.2* 24.4* 26.8*  MCV 92.0  < > 91.1 92.7 91.0 92.1 92.4  PLT 148*  < > 113* 134* 113* 115* 110*  < > = values in this interval not displayed.   CBG:  Recent Labs Lab 05/01/17 1619 05/01/17 2103 05/02/17 0734 05/02/17 1129 05/02/17 1623  GLUCAP 127* 120* 113* 97 97    Recent Results (from the past 240 hour(s))  MRSA PCR Screening     Status: None   Collection Time: 04/28/17  8:08 AM  Result Value Ref Range Status   MRSA by PCR NEGATIVE NEGATIVE Final    Comment:        The GeneXpert MRSA Assay (FDA approved for NASAL specimens only), is one component of a comprehensive MRSA colonization surveillance program. It is not intended to diagnose MRSA infection nor to guide or monitor treatment for MRSA infections.      Studies: No results found.  Scheduled Meds: . carvedilol  3.125 mg Oral BID WC  . digoxin  0.125 mg Oral Daily   Continuous Infusions: . heparin 550 Units/hr (05/02/17 0701)    Principal Problem:   Rectal  bleeding Active Problems:   Nonischemic cardiomyopathy ([facility])   Atrial fibrillation ([facility])   Sick sinus syndrome ([facility])   Systolic CHF, chronic ([facility])   S/P Mechanical MVR (mitral valve replacement)   Dementia   ICD (implantable cardioverter-defibrillator), biventricular, in situ   Acute blood loss anemia    Time spent: 25 minutes    Barton Dubois  [facility] Pager [phone]. If 7PM-7AM, please contact night-coverage at [email or web address], password [facility] 05/02/2017, 7:15 PM  LOS: 4 days

## 2017-05-02 NOTE — Plan of Care (Signed)
Problem: Physical Regulation: Goal: Ability to maintain clinical measurements within normal limits will improve Outcome: Progressing No s/s of bleeding during shift. VSS. 1 unit PRBC now infused. Labs drawn for morning. Heparin running as ordered. Will continue to monitor closely for bleeding.

## 2017-05-02 NOTE — Progress Notes (Signed)
ANTICOAGULATION CONSULT NOTE - F/u Consult  Pharmacy Consult for Heparin Indication: mechanical mitral valve  No Known Allergies  Patient Measurements: Height: 5\' 7"  (170.2 cm) Weight: 139 lb 1.6 oz (63.1 kg) IBW/kg (Calculated) : 61.6 Heparin Dosing Weight: 63.8 kg  Assessment: 71 yr old female on Coumadin prior to admission for hx mechanical mitral valve, afib, and DVT.  Admitted with bloody stools and elevated INR; bloody stools again x1 on 9/6.  Per GI conservative management only. Palliative care to see for goals of care.   Pharmacy consulted to dose heparin while warfarin on hold. -heparin level is at goal on 550 units/hr -hg stable, plt low/stable  Goal of Therapy:  Heparin level 0.3-0.5 units/ml, low end of therapeutic range Monitor platelets by anticoagulation protocol: Yes   Plan:  Increase heparin gtt to 550 units/hr Daily heparin level and CBC   Hildred Laser, Pharm D 05/02/2017 11:21 AM

## 2017-05-03 LAB — CBC
HCT: 29.3 % — ABNORMAL LOW (ref 36.0–46.0)
Hemoglobin: 9.5 g/dL — ABNORMAL LOW (ref 12.0–15.0)
MCH: 30.2 pg (ref 26.0–34.0)
MCHC: 32.4 g/dL (ref 30.0–36.0)
MCV: 93 fL (ref 78.0–100.0)
Platelets: 149 10*3/uL — ABNORMAL LOW (ref 150–400)
RBC: 3.15 MIL/uL — ABNORMAL LOW (ref 3.87–5.11)
RDW: 17.1 % — ABNORMAL HIGH (ref 11.5–15.5)
WBC: 5.3 10*3/uL (ref 4.0–10.5)

## 2017-05-03 LAB — PROTIME-INR
INR: 1.1
Prothrombin Time: 14.1 s (ref 11.4–15.2)

## 2017-05-03 LAB — GLUCOSE, CAPILLARY
Glucose-Capillary: 93 mg/dL (ref 65–99)
Glucose-Capillary: 102 mg/dL — ABNORMAL HIGH (ref 65–99)

## 2017-05-03 LAB — HEPARIN LEVEL (UNFRACTIONATED): Heparin Unfractionated: 0.3 [IU]/mL (ref 0.30–0.70)

## 2017-05-03 MED ORDER — WARFARIN SODIUM 10 MG PO TABS
10.0000 mg | ORAL_TABLET | Freq: Once | ORAL | Status: AC
  Administered 2017-05-03: 10 mg via ORAL
  Filled 2017-05-03: qty 1

## 2017-05-03 NOTE — Progress Notes (Signed)
[facility] PROGRESS NOTE  Angela [name] CWU:[id number] DOB: 1946-07-04 DOA: 04/28/2017 PCP: Lorene Dy, MD  Interim summary and HPI 71 y.o. female with history of nonischemic cardiomyopathy status post ICD placement, atrial fibrillation, mechanical mitral valve, colon cancer in remission presents to the ER the patient had a large bloody bowel movement at midnight. Patient states that she woke up in the middle of the night to go to the bathroom and had a large bloody bowel which was painless. Had mild dizziness denies any chest pain or shortness of breath. Denies any abdominal pain nausea vomiting.  Assessment/Plan: 1-GI bleed: presumed to be lower GI bleed. -no further signs of acute bleeding appreciated. -Patient with very high thromboembolic risk off anticoagulation; but at the same time demonstrating/struggling with GIB. -will continue heparin and coumadin bridging  -continue to follow Hgb trend  -palliative care consultation unsuccessful and family not understanding big picture of patient's condition. -they have now expressed willing to pursuit colonoscopy if further bleeding experienced.  2-A. Fib and mechanical valve -continue low dose b-blocker and the use of digoxin for rate control. -continue telemetry monitoring  -CHADsVASC score 4 -will continue heparin/coumadin bridging   3-non-ischemic cardiomyopathy/sick sinus syndrome   -s/p ICD -follow daily weights and strict I's/O's -no signs of fluid overload  -continue low dose B-blocker  -Patient was soft blood pressure.  4-dementia -poor overall insight  -no agitation -will continue supportive care  5-acute blood loss anemia -due to GI bleed -Hgb down to 7.9; from 12.4; received 1 unit of PRBC and Hgb is 9.5 now. -Will follow hemoglobin trend -patient currently on heparin drip and coumadin bridging.  -No signs of further bleeding   6-hx of colon cancer  -appears to be in remission -Palliative care has met  with family and also successful intervention to determine advance care planning and future goals of care.  -Family has expressed to the palliative care service that if the patient continued to experience dropping her hemoglobin or bleeding that they will like to pursuit colonoscopy.   Code Status: Full Family Communication: husband at bedside  Disposition Plan: to be determined. Will continue patient on heparin, a Coumadin bridging on 05/03/2017; ) follow-up to patient's hemoglobin trend. If there is any signs of further bleeding family has now agree for the patient to have colonoscopy. Discussion with palliative care unsuccessful patient remains full code and is no clear to the patient husband the need for future decisions at this moment.   Consultants: GI Palliative Care  Procedures:  None   Antibiotics:  None   HPI/Subjective: Patient afebrile, no chest pain, no shortness of breath; denies nausea and vomiting. Patient denies any melanotic stool or any bright red blood with her feces. Despite soft blood pressure has remained hemodynamically stable.  Objective: Vitals:   05/03/17 0918 05/03/17 1214  BP: (!) 95/56 (!) 79/54  Pulse:  76  Resp: 20 18  Temp:  97.6 F (36.4 C)  SpO2:  100%    Intake/Output Summary (Last 24 hours) at 05/03/17 1539 Last data filed at 05/03/17 0929  Gross per 24 hour  Intake              360 ml  Output              650 ml  Net             -290 ml   Filed Weights   05/01/17 0434 05/01/17 2108 05/03/17 0500  Weight: 63.2 kg (139 lb 6.4 oz)  63.1 kg (139 lb 1.6 oz) 62.3 kg (137 lb 6.4 oz)    Exam:   General: AAOX2; no fever, no CP, no SOB. Patient denies abdominal and has not experienced any further episodes of bloody stools.  Rest of physical exam unchanged from 05/02/17; see below for details:  Cardiovascular: Rate controlled, no rubs, no gallops, positive metallic click and soft systolic ejection murmur appreciated on exam.   Respiratory:  Good air movement bilaterally, no wheezing, no crackles.   Abdomen: Soft, nontender, nondistended, positive bowel sounds.  Musculoskeletal: No edema, no cyanosis, no clubbing.   Data Reviewed: Basic Metabolic Panel:  Recent Labs Lab 04/28/17 0245 05/01/17 0408  NA 141 140  K 4.0 3.3*  CL 110 112*  CO2 22 21*  GLUCOSE 123* 95  BUN 21* 8  CREATININE 1.13* 0.78  CALCIUM 9.1 8.3*  MG  --  1.7   Liver Function Tests:  Recent Labs Lab 04/28/17 0245  AST 21  ALT 15  ALKPHOS 42  BILITOT 0.6  PROT 6.0*  ALBUMIN 3.5   CBC:  Recent Labs Lab 04/28/17 0245  04/29/17 1620 04/30/17 0716 05/01/17 0408 05/02/17 0056 05/03/17 0224  WBC 5.0  < > 5.5 4.6 4.6 5.8 5.3  NEUTROABS 2.4  --   --   --   --   --   --   HGB 12.4  < > 9.3* 8.2* 7.9* 8.8* 9.5*  HCT 38.9  < > 29.2* 25.2* 24.4* 26.8* 29.3*  MCV 92.0  < > 92.7 91.0 92.1 92.4 93.0  PLT 148*  < > 134* 113* 115* 110* 149*  < > = values in this interval not displayed.   CBG:  Recent Labs Lab 05/02/17 1129 05/02/17 1623 05/02/17 2110 05/03/17 0504 05/03/17 1414  GLUCAP 97 97 99 93 102*    Recent Results (from the past 240 hour(s))  MRSA PCR Screening     Status: None   Collection Time: 04/28/17  8:08 AM  Result Value Ref Range Status   MRSA by PCR NEGATIVE NEGATIVE Final    Comment:        The GeneXpert MRSA Assay (FDA approved for NASAL specimens only), is one component of a comprehensive MRSA colonization surveillance program. It is not intended to diagnose MRSA infection nor to guide or monitor treatment for MRSA infections.      Studies: No results found.  Scheduled Meds: . carvedilol  3.125 mg Oral BID WC  . digoxin  0.125 mg Oral Daily  . warfarin  10 mg Oral ONCE-1800  . Warfarin - Pharmacist Dosing Inpatient   Does not apply q1800   Continuous Infusions: . heparin 550 Units/hr (05/03/17 0700)    Principal Problem:   Rectal bleeding Active Problems:   Nonischemic cardiomyopathy  (HCC)   Atrial fibrillation (HCC)   Sick sinus syndrome (HCC)   Systolic CHF, chronic ([facility]CC)   S/P Mechanical MVR (mitral valve replacement)   Dementia   ICD (implantable cardioverter-defibrillator), biventricular, in situ   Acute blood loss anemia    Time spent: 25 minutes    Barton Dubois  [facility] Pager [phone]. If 7PM-7AM, please contact night-coverage at [email or web address], password [facility] 05/03/2017, 3:39 PM  LOS: 5 days

## 2017-05-03 NOTE — Progress Notes (Signed)
ANTICOAGULATION CONSULT NOTE - Initial Consult  Pharmacy Consult for warfarin and heparin bridging Indication: mechanical mitral valve  No Known Allergies  Patient Measurements: Height: 5\' 7"  (170.2 cm) Weight: 137 lb 6.4 oz (62.3 kg) IBW/kg (Calculated) : 61.6 Heparin Dosing Weight: 63.8 kg  Vital Signs: Temp: 97.5 F (36.4 C) (09/08 0500) Temp Source: Oral (09/08 0500) BP: 82/53 (09/08 0500) Pulse Rate: 72 (09/08 0500)  Labs:  Recent Labs  05/01/17 0408  05/02/17 0056 05/02/17 0939 05/03/17 0224  HGB 7.9*  --  8.8*  --  9.5*  HCT 24.4*  --  26.8*  --  29.3*  PLT 115*  --  110*  --  149*  LABPROT 16.4*  --  15.9*  --  14.1  INR 1.34  --  1.28  --  1.10  HEPARINUNFRC  --   < > 0.29* 0.41 0.30  CREATININE 0.78  --   --   --   --   < > = values in this interval not displayed.  Estimated Creatinine Clearance: 60.9 mL/min (by C-G formula based on SCr of 0.78 mg/dL).   Medical History: Past Medical History:  Diagnosis Date  . Abnormality of gait 08/23/2014  . Arthritis   . Atrial fibrillation ([place])    A.  Chronic Coumadin  . Cardiac arrest - ventricular fibrillation    A.  12/2007;  B. 01/2008 St. Jude Promote Bi-V ICD placed  . CVA (cerebral vascular accident) ([place])   . Diverticular disease   . DVT (deep venous thrombosis) ([place])   . 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 History of colon cancer    A.  1999 - T3, N1  chemotherapy  . Internal hemorrhoids   . Memory deficit 08/23/2014  . Nonischemic cardiomyopathy ([place])    a.  11/2001 - Cath - NL Cors; b. ECHO 09/22/11: EF 20%, MVR normal, moderate LAE, mild RAE c. ECHO (08/2012): ED 20%, diff HK, LA mod dilated, mild/mod TR, RV mild/mod decreased sys fx  . Rectal bleeding 04/28/2017  . Rheumatic heart disease    A. 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 Sick sinus syndrome ([place])    A.  s/p pacer in 1995.  B.    . Systolic CHF, chronic ([place])    A.  01/2008  Echo - EF 10-20%    Medications:  Prescriptions Prior to Admission  Medication Sig Dispense Refill Last Dose  . alendronate (FOSAMAX) 70 MG tablet Take 70 mg by mouth once a week. Take with a full glass of water on an empty stomach.   Past Month at Unknown time  . carvedilol (COREG) 3.125 MG tablet TAKE ONE (1) TABLET BY MOUTH TWO (2) TIMES DAILY WITH A MEAL 60 tablet 11 04/27/2017 at 1700  . cholecalciferol (VITAMIN D) 1000 UNITS tablet Take 2,000 Units by mouth daily.   04/27/2017 at Unknown time  . digoxin (LANOXIN) 0.125 MG tablet Take 0.125 mg by mouth daily.   04/27/2017 at Unknown time  . furosemide (LASIX) 40 MG tablet TAKE ONE (1) TABLET BY MOUTH EVERY DAY. IF WEIGHT IS GREATER THAN 140LBS ON HOME SCALE, TAKE 2 TABLETS (80MG ) (Patient taking differently: Take 20 mg by mouth daily as needed for fluid. ) 60 tablet 3 Past Week at Unknown time  . meclizine (ANTIVERT) 25 MG tablet Take 25 mg by mouth 2 (two) times daily as needed for dizziness or nausea.  unk  . pentoxifylline (TRENTAL) 400 MG CR tablet Take 400 mg by mouth daily as needed (leg pain).    unk  . spironolactone (ALDACTONE) 25 MG tablet TAKE ONE-HALF (1/2) TABLET BY MOUTH DAILY 45 tablet 3 04/27/2017 at Unknown time  . tiZANidine (ZANAFLEX) 4 MG tablet Take 4 mg by mouth every 6 (six) hours as needed for muscle spasms.   unk  . warfarin (COUMADIN) 6 MG tablet Take 8-9 mg by mouth daily. 8 mg everyday except Monday take 9 mg.   04/27/2017 at 0700    Assessment: 71 yr old female on Coumadin prior to admission for hx mechanical mitral valve, afib, and DVT.  Admitted with bloody stools and elevated INR; bloody stools again x1 on 9/6.  Per GI conservative management only. Palliative care questioned whether to resume warfarin or not, but decision made to restart warfarin and will continue to bridge heparin and warfarin to aim for therapeutic INR 2.5-2.7.  INR on admission was 4.32, received vitamin K 5 mg on 9/3. Also received PRBC x 1 on  9/6. Last dose of warfarin 10mg  was 9/7 at 2000, current INR 1.10 (subtherapeutic), and heparin level 0.3 (therapeutic) at 0224. Per RN, maroon colored stools, but not getting worse.  CBC stable.  PTA dose: warfarin 9 mg on Mondays and 8 mg on all other days  Goal of Therapy:  INR 2.5-2.7 Heparin level 0.3-0.5 Monitor platelets by anticoagulation protocol: Yes   Plan:  Warfarin 10 mg PO x 1 Continue heparin 550 units/hr as bridge therapy. Daily INR, daily heparin level, CBCs F/u s/sx bleeding, PO intake, DDI Daily heparin level Monitor clinical course, s/sx bleeding   Nida Boatman, PharmD PGY1 Acute Care Pharmacy Resident Pager: [phone] 7:36 AM 05/03/2017

## 2017-05-04 LAB — HEPARIN LEVEL (UNFRACTIONATED)
Heparin Unfractionated: 0.28 IU/mL — ABNORMAL LOW (ref 0.30–0.70)
Heparin Unfractionated: 0.37 IU/mL (ref 0.30–0.70)
Heparin Unfractionated: 0.37 IU/mL (ref 0.30–0.70)

## 2017-05-04 LAB — BASIC METABOLIC PANEL
Anion gap: 6 (ref 5–15)
BUN: 10 mg/dL (ref 6–20)
CO2: 23 mmol/L (ref 22–32)
Calcium: 8.4 mg/dL — ABNORMAL LOW (ref 8.9–10.3)
Chloride: 109 mmol/L (ref 101–111)
Creatinine, Ser: 0.96 mg/dL (ref 0.44–1.00)
GFR calc Af Amer: >60 mL/min (ref >60)
GFR calc non Af Amer: 57 mL/min — ABNORMAL LOW (ref >60)
Glucose, Bld: 100 mg/dL — ABNORMAL HIGH (ref 65–99)
Potassium: 3.8 mmol/L (ref 3.5–5.1)
Sodium: 138 mmol/L (ref 135–145)

## 2017-05-04 LAB — GLUCOSE, CAPILLARY
Glucose-Capillary: 90 mg/dL (ref 65–99)
Glucose-Capillary: 104 mg/dL — ABNORMAL HIGH (ref 65–99)
Glucose-Capillary: 105 mg/dL — ABNORMAL HIGH (ref 65–99)
Glucose-Capillary: 125 mg/dL — ABNORMAL HIGH (ref 65–99)
Glucose-Capillary: 109 mg/dL — ABNORMAL HIGH (ref 65–99)

## 2017-05-04 LAB — PROTIME-INR
INR: 1.34
Prothrombin Time: 16.5 seconds — ABNORMAL HIGH (ref 11.4–15.2)

## 2017-05-04 LAB — MAGNESIUM: Magnesium: 1.8 mg/dL (ref 1.7–2.4)

## 2017-05-04 LAB — CBC
HCT: 25.3 % — ABNORMAL LOW (ref 36.0–46.0)
Hemoglobin: 8.3 g/dL — ABNORMAL LOW (ref 12.0–15.0)
MCH: 30.7 pg (ref 26.0–34.0)
MCHC: 32.8 g/dL (ref 30.0–36.0)
MCV: 93.7 fL (ref 78.0–100.0)
Platelets: 127 10*3/uL — ABNORMAL LOW (ref 150–400)
RBC: 2.7 MIL/uL — ABNORMAL LOW (ref 3.87–5.11)
RDW: 17.6 % — ABNORMAL HIGH (ref 11.5–15.5)
WBC: 3.9 10*3/uL — ABNORMAL LOW (ref 4.0–10.5)

## 2017-05-04 MED ORDER — WARFARIN SODIUM 10 MG PO TABS
10.0000 mg | ORAL_TABLET | Freq: Once | ORAL | Status: AC
  Administered 2017-05-04: 10 mg via ORAL
  Filled 2017-05-04: qty 1

## 2017-05-04 NOTE — Progress Notes (Addendum)
ANTICOAGULATION CONSULT NOTE - Initial Consult  Pharmacy Consult for warfarin and heparin bridging Indication: mechanical mitral valve  No Known Allergies  Patient Measurements: Height: 5\' 7"  (170.2 cm) Weight: 137 lb 14.4 oz (62.6 kg) IBW/kg (Calculated) : 61.6 Heparin Dosing Weight: 63.8 kg  Vital Signs: Temp: 98.3 F (36.8 C) (09/09 0808) Temp Source: Oral (09/09 0808) BP: 92/60 (09/09 0808) Pulse Rate: 73 (09/09 0808)  Labs:  Recent Labs  05/02/17 0056  05/03/17 0224 05/04/17 0219 05/04/17 1030  HGB 8.8*  --  9.5* 8.3*  --   HCT 26.8*  --  29.3* 25.3*  --   PLT 110*  --  149* 127*  --   LABPROT 15.9*  --  14.1 16.5*  --   INR 1.28  --  1.10 1.34  --   HEPARINUNFRC 0.29*  < > 0.30 0.28* 0.37  CREATININE  --   --   --  0.96  --   < > = values in this interval not displayed.  Estimated Creatinine Clearance: 50.8 mL/min (by C-G formula based on SCr of 0.96 mg/dL).   Medical History: Past Medical History:  Diagnosis Date  . Abnormality of gait 08/23/2014  . Arthritis   . Atrial fibrillation ([place])    A.  Chronic Coumadin  . Cardiac arrest - ventricular fibrillation    A.  12/2007;  B. 01/2008 St. Jude Promote Bi-V ICD placed  . CVA (cerebral vascular accident) ([place])   . Diverticular disease   . DVT (deep venous thrombosis) ([place])   . 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 History of colon cancer    A.  1999 - T3, N1  chemotherapy  . Internal hemorrhoids   . Memory deficit 08/23/2014  . Nonischemic cardiomyopathy ([place])    a.  11/2001 - Cath - NL Cors; b. ECHO 09/22/11: EF 20%, MVR normal, moderate LAE, mild RAE c. ECHO (08/2012): ED 20%, diff HK, LA mod dilated, mild/mod TR, RV mild/mod decreased sys fx  . Rectal bleeding 04/28/2017  . Rheumatic heart disease    A. 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 Sick sinus syndrome ([place])    A.  s/p pacer in 1995.  B.    . Systolic CHF, chronic ([place])    A.  01/2008  Echo - EF 10-20%    Medications:  Prescriptions Prior to Admission  Medication Sig Dispense Refill Last Dose  . alendronate (FOSAMAX) 70 MG tablet Take 70 mg by mouth once a week. Take with a full glass of water on an empty stomach.   Past Month at Unknown time  . carvedilol (COREG) 3.125 MG tablet TAKE ONE (1) TABLET BY MOUTH TWO (2) TIMES DAILY WITH A MEAL 60 tablet 11 04/27/2017 at 1700  . cholecalciferol (VITAMIN D) 1000 UNITS tablet Take 2,000 Units by mouth daily.   04/27/2017 at Unknown time  . digoxin (LANOXIN) 0.125 MG tablet Take 0.125 mg by mouth daily.   04/27/2017 at Unknown time  . furosemide (LASIX) 40 MG tablet TAKE ONE (1) TABLET BY MOUTH EVERY DAY. IF WEIGHT IS GREATER THAN 140LBS ON HOME SCALE, TAKE 2 TABLETS (80MG ) (Patient taking differently: Take 20 mg by mouth daily as needed for fluid. ) 60 tablet 3 Past Week at Unknown time  . meclizine (ANTIVERT) 25 MG tablet Take 25 mg by mouth 2 (two) times daily as needed for dizziness or nausea.   unk  .  pentoxifylline (TRENTAL) 400 MG CR tablet Take 400 mg by mouth daily as needed (leg pain).    unk  . spironolactone (ALDACTONE) 25 MG tablet TAKE ONE-HALF (1/2) TABLET BY MOUTH DAILY 45 tablet 3 04/27/2017 at Unknown time  . tiZANidine (ZANAFLEX) 4 MG tablet Take 4 mg by mouth every 6 (six) hours as needed for muscle spasms.   unk  . warfarin (COUMADIN) 6 MG tablet Take 8-9 mg by mouth daily. 8 mg everyday except Monday take 9 mg.   04/27/2017 at 0700    Assessment: 71 yr old female on warfarin prior to admission for hx mechanical mitral valve, afib, and DVT.  Admitted with bloody stools and elevated INR; bloody stools again x1 on 9/6.  Per GI conservative management only. Bridging with heparin while on warfarin and will continue to aim for therapeutic INR 2.5-2.7.  INR on admission was 4.32, received vitamin K 5 mg on 9/3. Also received PRBC x 1 on 9/6. Last dose of warfarin 10mg  was given at 9/8 at 1920, current INR remains subtherapeutic  1.34, and heparin level therapeutic at 0.37. Hgb down from 9.5 to 8.3, with no signs/sx of bleeding.  PTA dose: warfarin 9 mg on Mondays and 8 mg on all other days  Goal of Therapy:  INR 2.5-2.7 Heparin level 0.3-0.5 Monitor platelets by anticoagulation protocol: Yes   Plan:  Warfarin 10 mg PO x 1 Continue heparin 600 units/hr as bridge therapy. Check 8 hour confirmatory level given narrow goal. Daily INR, daily heparin level, CBCs F/u s/sx bleeding, PO intake, DDI  Nida Boatman, PharmD PGY1 Acute Care Pharmacy Resident Pager: [phone] 11:34 AM 05/04/2017

## 2017-05-04 NOTE — Progress Notes (Signed)
[facility] PROGRESS NOTE  Angela [name] IPJ:[id number] DOB: 30-Oct-1945 DOA: 04/28/2017 PCP: Lorene Dy, MD  Interim summary and HPI 71 y.o. female with history of nonischemic cardiomyopathy status post ICD placement, atrial fibrillation, mechanical mitral valve, colon cancer in remission presents to the ER the patient had a large bloody bowel movement at midnight. Patient states that she woke up in the middle of the night to go to the bathroom and had a large bloody bowel which was painless. Had mild dizziness denies any chest pain or shortness of breath. Denies any abdominal pain nausea vomiting.  Assessment/Plan: 1-GI bleed: presumed to be lower GI bleed. -no further signs of acute bleeding appreciated. -Patient with very high thromboembolic risk off anticoagulation; but at the same time demonstrating/struggling with GIB. -will continue heparin and coumadin bridging  -continue to follow Hgb trend  -palliative care consultation unsuccessful and family not understanding big picture of patient's condition. -they have now expressed willing to pursuit colonoscopy if further bleeding experienced.  2-A. Fib and mechanical valve -continue low dose b-blocker and the use of digoxin for rate control. -continue telemetry monitoring  -CHADsVASC score 4 -will continue heparin/coumadin bridging -follow Hgb trend    3-non-ischemic cardiomyopathy/sick sinus syndrome   -s/p ICD -follow daily weights and strict I's/O's -no signs of fluid overload  -continue low dose B-blocker  -Patient has soft blood pressure. -asymptomatic and euvolemic -transient runs of V-tac; normal K and Mg.  4-dementia -poor overall insight  -no agitation -will continue supportive care  5-acute blood loss anemia -due to GI bleed -Hgb down to 7.9; from 12.4; has received 1 unit of PRBC  -Hgb is 8.3 today. -Will follow hemoglobin trend -patient currently on heparin drip and coumadin bridging.  -No signs of  further bleeding   6-hx of colon cancer  -appears to be in remission -Palliative care has met with family and was not successful in determining advance care planning and future goals of care.  -Family has expressed to the palliative care service that if the patient continued to experience dropping in her hemoglobin or experienced further bleeding that they will like to pursuit colonoscopy.   Code Status: Full Family Communication: husband at bedside  Disposition Plan: to be determined. Will continue patient on heparin, a Coumadin bridging (started on 05/03/2017; ) follow-up to patient's hemoglobin trend. If there is any signs of further bleeding family has now agree for the patient to have colonoscopy. Discussion with palliative care unsuccessful patient remains full code and is no clear to the patient's husband the need for future decisions at this moment.   Consultants: GI Palliative Care  Procedures:  None   Antibiotics:  None   HPI/Subjective: Afebrile, in no acute distress, denies CP, palpitations, nausea, vomiting or any further blood loss. Telemetry capturing asymptomatic transient runs of V-tac  Objective: Vitals:   05/04/17 1626 05/04/17 2003  BP:  (!) 97/53  Pulse: 74 75  Resp: 19 (!) 28  Temp:  98.4 F (36.9 C)  SpO2: 100% 100%    Intake/Output Summary (Last 24 hours) at 05/04/17 2139 Last data filed at 05/04/17 2007  Gross per 24 hour  Intake                0 ml  Output              200 ml  Net             -200 ml   Filed Weights   05/01/17 2108 05/03/17 0500  05/04/17 0442  Weight: 63.1 kg (139 lb 1.6 oz) 62.3 kg (137 lb 6.4 oz) 62.6 kg (137 lb 14.4 oz)    Exam:   General: afebrile, no CP, no SOB and no acute blood loss reported. Patient also denies nausea and vomiting.   Cardiovascular: rate controlled, positive metallic click and positive SEM.   Respiratory: good air movement, no wheezing, no crackles.  Abdomen: soft, NT, ND, positive  BS Musculoskeletal: no edema, no cyanosis, no clubbing   Data Reviewed: Basic Metabolic Panel:  Recent Labs Lab 04/28/17 0245 05/01/17 0408 05/04/17 0219 05/04/17 1030  NA 141 140 138  --   K 4.0 3.3* 3.8  --   CL 110 112* 109  --   CO2 22 21* 23  --   GLUCOSE 123* 95 100*  --   BUN 21* 8 10  --   CREATININE 1.13* 0.78 0.96  --   CALCIUM 9.1 8.3* 8.4*  --   MG  --  1.7  --  1.8   Liver Function Tests:  Recent Labs Lab 04/28/17 0245  AST 21  ALT 15  ALKPHOS 42  BILITOT 0.6  PROT 6.0*  ALBUMIN 3.5   CBC:  Recent Labs Lab 04/28/17 0245  04/30/17 0716 05/01/17 0408 05/02/17 0056 05/03/17 0224 05/04/17 0219  WBC 5.0  < > 4.6 4.6 5.8 5.3 3.9*  NEUTROABS 2.4  --   --   --   --   --   --   HGB 12.4  < > 8.2* 7.9* 8.8* 9.5* 8.3*  HCT 38.9  < > 25.2* 24.4* 26.8* 29.3* 25.3*  MCV 92.0  < > 91.0 92.1 92.4 93.0 93.7  PLT 148*  < > 113* 115* 110* 149* 127*  < > = values in this interval not displayed.   CBG:  Recent Labs Lab 05/03/17 1414 05/04/17 0446 05/04/17 0746 05/04/17 1140 05/04/17 1615  GLUCAP 102* 90 104* 105* 125*    Recent Results (from the past 240 hour(s))  MRSA PCR Screening     Status: None   Collection Time: 04/28/17  8:08 AM  Result Value Ref Range Status   MRSA by PCR NEGATIVE NEGATIVE Final    Comment:        The GeneXpert MRSA Assay (FDA approved for NASAL specimens only), is one component of a comprehensive MRSA colonization surveillance program. It is not intended to diagnose MRSA infection nor to guide or monitor treatment for MRSA infections.      Studies: No results found.  Scheduled Meds: . carvedilol  3.125 mg Oral BID WC  . digoxin  0.125 mg Oral Daily  . Warfarin - Pharmacist Dosing Inpatient   Does not apply q1800   Continuous Infusions: . heparin 600 Units/hr (05/04/17 0700)    Principal Problem:   Rectal bleeding Active Problems:   Nonischemic cardiomyopathy ([facility])   Atrial fibrillation ([facility])   Sick  sinus syndrome ([facility])   Systolic CHF, chronic ([facility])   S/P Mechanical MVR (mitral valve replacement)   Dementia   ICD (implantable cardioverter-defibrillator), biventricular, in situ   Acute blood loss anemia    Time spent: 25 minutes    Barton Dubois  [facility] Pager [phone]. If 7PM-7AM, please contact night-coverage at [email or web address], password [facility] 05/04/2017, 9:39 PM  LOS: 6 days

## 2017-05-04 NOTE — Progress Notes (Signed)
ANTICOAGULATION CONSULT NOTE - Follow Up Consult  Pharmacy Consult for heparin Indication: MVR  Labs:  Recent Labs  05/01/17 0408  05/02/17 0056 05/02/17 0939 05/03/17 0224 05/04/17 0219  HGB 7.9*  --  8.8*  --  9.5* 8.3*  HCT 24.4*  --  26.8*  --  29.3* 25.3*  PLT 115*  --  110*  --  149* 127*  LABPROT 16.4*  --  15.9*  --  14.1 16.5*  INR 1.34  --  1.28  --  1.10 1.34  HEPARINUNFRC  --   < > 0.29* 0.41 0.30 0.28*  CREATININE 0.78  --   --   --   --  0.96  < > = values in this interval not displayed.   Assessment: 71yo female now slightly subtherapeutic on heparin after two levels at goal though had been trending down.  Goal of Therapy:  Heparin level 0.3-0.5 units/ml   Plan:  Will increase heparin gtt slightly to 600 units/hr and check level in [place], PharmD, BCPS  05/04/2017,3:18 AM

## 2017-05-04 NOTE — Progress Notes (Signed)
[place] for heparin Indication: mechanical mitral valve  No Known Allergies  Patient Measurements: Height: 5\' 7"  (170.2 cm) Weight: 137 lb 14.4 oz (62.6 kg) IBW/kg (Calculated) : 61.6 Heparin Dosing Weight: 63.8 kg  Vital Signs: Temp: 98 F (36.7 C) (09/09 1141) Temp Source: Oral (09/09 1141) BP: 107/94 (09/09 1141) Pulse Rate: 76 (09/09 1141)  Labs:  Recent Labs  05/02/17 0056  05/03/17 0224 05/04/17 0219 05/04/17 1030 05/04/17 1416  HGB 8.8*  --  9.5* 8.3*  --   --   HCT 26.8*  --  29.3* 25.3*  --   --   PLT 110*  --  149* 127*  --   --   LABPROT 15.9*  --  14.1 16.5*  --   --   INR 1.28  --  1.10 1.34  --   --   HEPARINUNFRC 0.29*  < > 0.30 0.28* 0.37 0.37  CREATININE  --   --   --  0.96  --   --   < > = values in this interval not displayed.  Estimated Creatinine Clearance: 50.8 mL/min (by C-G formula based on SCr of 0.96 mg/dL).  Assessment: 71 yr old female on warfarin prior to admission for hx mechanical mitral valve, afib, and DVT.  Admitted with bloody stools and elevated INR; bloody stools again x1 on 9/6.  Per GI conservative management only. Bridging with heparin while on warfarin and will continue to aim for therapeutic INR 2.5-2.7.  Heparin level remains therapeutic at 0.37.   Goal of Therapy:  Heparin level 0.3-0.5 Monitor platelets by anticoagulation protocol: Yes   Plan:  Continue heparin 600 units/hr as bridge therapy. Daily heparin level and CBC  Salome Arnt, PharmD, BCPS 05/04/2017 3:37 PM

## 2017-05-04 NOTE — Progress Notes (Signed)
Notified by CCMD that patient had 11 beat run of vtach. Assessed patient. Vital signs stable. No complaints of shortness or breath, chest pain. Not diaphoretic. Text paged MD. No new orders given at this time. Will continue to monitor.

## 2017-05-05 ENCOUNTER — Telehealth: Payer: Self-pay | Admitting: Cardiology

## 2017-05-05 ENCOUNTER — Ambulatory Visit (INDEPENDENT_AMBULATORY_CARE_PROVIDER_SITE_OTHER): Payer: Medicare HMO | Admitting: *Deleted

## 2017-05-05 DIAGNOSIS — I4901 Ventricular fibrillation: Secondary | ICD-10-CM | POA: Diagnosis not present

## 2017-05-05 DIAGNOSIS — I428 Other cardiomyopathies: Secondary | ICD-10-CM

## 2017-05-05 DIAGNOSIS — I5022 Chronic systolic (congestive) heart failure: Secondary | ICD-10-CM

## 2017-05-05 LAB — CBC
HCT: 26.3 % — ABNORMAL LOW (ref 36.0–46.0)
Hemoglobin: 8.3 g/dL — ABNORMAL LOW (ref 12.0–15.0)
MCH: 29.7 pg (ref 26.0–34.0)
MCHC: 31.6 g/dL (ref 30.0–36.0)
MCV: 94.3 fL (ref 78.0–100.0)
Platelets: 153 10*3/uL (ref 150–400)
RBC: 2.79 MIL/uL — ABNORMAL LOW (ref 3.87–5.11)
RDW: 17.2 % — ABNORMAL HIGH (ref 11.5–15.5)
WBC: 5.8 10*3/uL (ref 4.0–10.5)

## 2017-05-05 LAB — PROTIME-INR
INR: 1.63
Prothrombin Time: 19.2 seconds — ABNORMAL HIGH (ref 11.4–15.2)

## 2017-05-05 LAB — HEPARIN LEVEL (UNFRACTIONATED)
Heparin Unfractionated: 0.26 IU/mL — ABNORMAL LOW (ref 0.30–0.70)
Heparin Unfractionated: 0.44 IU/mL (ref 0.30–0.70)

## 2017-05-05 LAB — GLUCOSE, CAPILLARY
Glucose-Capillary: 128 mg/dL — ABNORMAL HIGH (ref 65–99)
Glucose-Capillary: 105 mg/dL — ABNORMAL HIGH (ref 65–99)

## 2017-05-05 MED ORDER — WARFARIN SODIUM 10 MG PO TABS
10.0000 mg | ORAL_TABLET | Freq: Once | ORAL | Status: AC
  Administered 2017-05-05: 10 mg via ORAL
  Filled 2017-05-05: qty 1

## 2017-05-05 NOTE — Telephone Encounter (Signed)
LMOVM reminding pt to send remote transmission.   

## 2017-05-05 NOTE — Progress Notes (Signed)
Updated report received in patient's room via [place], reviewed events of the day, new orders, VS, assumed care of the patient.

## 2017-05-05 NOTE — Progress Notes (Signed)
Daily Progress Note   Patient Name: Angela [name]       Date: 05/05/2017 DOB: [birth year]  Age: 71 y.o. MRN#: [id number] Attending Physician: Barton Dubois, MD Primary Care Physician: Lorene Dy, MD Admit Date: 04/28/2017  Reason for Consultation/Follow-up: Establishing goals of care  Subjective: Husband at bedside with patient. Angela [name] has no complaints this morning and is resting in bed.   24 hour events: No rectal bleeding noted, HGB stable. Lovenox to Coumadin bridge in place (mechanical valve, a-fib, ICD). INR 1.63. Goal INR of 2.5-2.7. Poor appetite with concern for stabilizing INR levels.   Length of Stay: 7  Current Medications: Scheduled Meds:  . carvedilol  3.125 mg Oral BID WC  . digoxin  0.125 mg Oral Daily  . Warfarin - Pharmacist Dosing Inpatient   Does not apply q1800    Continuous Infusions: . heparin 650 Units/hr (05/05/17 0429)    PRN Meds: acetaminophen **OR** acetaminophen, ondansetron **OR** ondansetron (ZOFRAN) IV, tiZANidine  Physical Exam  Constitutional: No distress.  HENT:  Head: Normocephalic and atraumatic.  Eyes: EOM are normal.  Cardiovascular:  Warm and dry  Pulmonary/Chest: Effort normal.  Neurological: She is alert.  Dementia at baseline, able to answer some questions.             Vital Signs: BP (!) 138/97   Pulse 68   Temp 98.7 F (37.1 C) (Oral)   Resp (!) 27   Ht 5\' 7"  (1.702 m)   Wt 60.1 kg (132 lb 9.6 oz)   SpO2 100%   BMI 20.77 kg/m  SpO2: SpO2: 100 % O2 Device: O2 Device: Not Delivered O2 Flow Rate:    Intake/output summary:  Intake/Output Summary (Last 24 hours) at 05/05/17 1101 Last data filed at 05/05/17 0600  Gross per 24 hour  Intake              133 ml  Output              400 ml  Net             -267 ml     LBM: Last BM Date: 05/03/17 Baseline Weight: Weight: 64.6 kg (142 lb 6.4 oz) Most recent weight: Weight: 60.1 kg (132 lb 9.6 oz)       Palliative Assessment/Data:40%  Patient Active Problem List   Diagnosis Date Noted  . Rectal bleeding 04/28/2017  . Acute blood loss anemia 04/28/2017  . ICD (implantable cardioverter-defibrillator), biventricular, in situ 08/24/2014  . Memory deficit 08/23/2014  . Abnormality of gait 08/23/2014  . Dementia 12/17/2013  . 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 Hypotension due to drugs 07/12/2012  . Hypokalemia 07/12/2012  . NSVT (nonsustained ventricular tachycardia) ([place]) 07/12/2012  . Acute on chronic systolic heart failure ([place]) 07/07/2012  . Ventricular fibrillation ([place]) 11/11/2011  . Warfarin anticoagulation 09/22/2011  . Nonischemic cardiomyopathy ([place])   . Atrial fibrillation ([place])   . History of colon cancer   . Internal hemorrhoids   . Rheumatic heart disease   . Sick sinus syndrome ([place])   . Systolic CHF, chronic ([place])   . DIVERTICULAR DISEASE 06/05/2009    Palliative Care Assessment & Plan   Patient Profile: Angela [name] a 71 y.o.femalewith history of nonischemic cardiomyopathy status post ICD placement, atrial fibrillation, mechanical mitral valve, colon cancer in remission who presented to the ER with a large bloody bowel movement. She has a history of diverticulosis. Her INR on admission was 4.32, and she was given vitamin K to lower INR.   She was evaluated by GI with recommendations to pursue conservative management and avoid colonoscopy. She is currently on a heparin drip. Question as to resumption of Coumadin due to mechanical valve given chance of bleeding And address goals of care    Assessment: Patient and husband state " everybody has a time and nobody knows when that is, and when it's her time it's her time, but we're not going to take away her chance." They state "we don't want  her living in a veggitative state, but give her a chance".  Discussed concepts of code status and they confirm that they would like her to be a full code, and would look at withdrawing care "if there was no hope." Discussed possible trajectories. Discussed outpatient palliative care and they state they will think about it.    Recommendations/Plan:  Continue current level of care and full code status.   Goals of Care and Additional Recommendations:  No limits  Code Status:    Code Status Orders        Start     Ordered   04/28/17 0448  Full code  Continuous     04/28/17 0449    Code Status History    Date Active Date Inactive Code Status Order ID Comments User Context   09/05/2014  3:24 PM 09/05/2014  7:57 PM Full Code [id number]  Evans Lance, MD Inpatient   09/21/2011  9:29 AM 09/23/2011  2:14 PM Full Code [id number]  Rise Patience, RN Inpatient       Prognosis:   Unable to determine Depending on symptoms with increase to goal INR levels. Upon discharge, depending on oral intake and therapeutic INR levels.   Discharge Planning:  Patient and family will consider home palliative care.    Thank you for allowing the Palliative Medicine Team to assist in the care of this patient.   Total Time 45 minutes Prolonged Time Billed  No      Greater than 50%  of this time was spent counseling and coordinating care related to the above assessment and plan.  Asencion Gowda, NP 05/05/2017 11:14 AM Office: [phone] 7am-7pm  Call primary team after hours  Please contact Palliative Medicine Team phone at [phone] for questions and concerns.

## 2017-05-05 NOTE — Progress Notes (Signed)
ANTICOAGULATION CONSULT NOTE - Follow Up Consult  Pharmacy Consult for heparin Indication: MVR  Labs:  Recent Labs  05/03/17 0224 05/04/17 0219 05/04/17 1030 05/04/17 1416 05/05/17 0137  HGB 9.5* 8.3*  --   --  8.3*  HCT 29.3* 25.3*  --   --  26.3*  PLT 149* 127*  --   --  153  LABPROT 14.1 16.5*  --   --  19.2*  INR 1.10 1.34  --   --  1.63  HEPARINUNFRC 0.30 0.28* 0.37 0.37 0.26*  CREATININE  --  0.96  --   --   --      Assessment: 71yo female now slightly subtherapeutic on heparin after one level at goal.  Goal of Therapy:  Heparin level 0.3-0.5 units/ml   Plan:  Will increase heparin gtt slightly to 650 units/hr and check level in [place], PharmD, BCPS  05/05/2017,3:43 AM

## 2017-05-05 NOTE — Progress Notes (Signed)
[facility] PROGRESS NOTE  Angela [name] LEX:[id number] DOB: Sep 10, 1945 DOA: 04/28/2017 PCP: Lorene Dy, MD  Interim summary and HPI 71 y.o. female with history of nonischemic cardiomyopathy status post ICD placement, atrial fibrillation, mechanical mitral valve, colon cancer in remission presents to the ER the patient had a large bloody bowel movement at midnight. Patient states that she woke up in the middle of the night to go to the bathroom and had a large bloody bowel which was painless. Had mild dizziness denies any chest pain or shortness of breath. Denies any abdominal pain nausea vomiting.  Assessment/Plan: 1-GI bleed: presumed to be lower GI bleed. -no further signs of acute bleeding appreciated. -Patient with very high thromboembolic risk off anticoagulation; but at the same time demonstrating/struggling with GIB. -will continue heparin and coumadin bridging; INR 1.6 (goal is 2.5--2.7) -continue to follow Hgb trend  -palliative care consultation unsuccessful and family not understanding big picture of patient's condition. -they have now expressed willing to pursuit colonoscopy if further bleeding experienced.  2-A. Fib and mechanical valve -continue low dose b-blocker and the use of digoxin for rate control. -continue telemetry monitoring  -CHADsVASC score 4 -will continue heparin/coumadin bridging; INR 1.6 -follow Hgb trend    3-non-ischemic cardiomyopathy/sick sinus syndrome   -s/p ICD -follow daily weights and strict I's/O's -no signs of fluid overload  -continue low dose B-blocker  -Patient has soft blood pressure. -asymptomatic and euvolemic -transient runs of V-tac; normal K and Mg.  4-dementia -poor overall insight  -no agitation -will continue supportive care  5-acute blood loss anemia -due to GI bleed -Hgb down to 7.9; from 12.4; has received 1 unit of PRBC  -Hgb has remained at 8.3 today. -Will follow hemoglobin trend -patient currently on  heparin drip and coumadin bridging.  -No signs of further bleeding   6-hx of colon cancer  -appears to be in remission -Palliative care has met with family and was not successful in determining advance care planning and future goals of care.  -Family has expressed to the palliative care service that if the patient continued to experience dropping in her hemoglobin or experienced further bleeding that they will like to pursuit colonoscopy.   Code Status: Full Family Communication: husband at bedside  Disposition Plan: to be determined. Will continue patient on heparin, a Coumadin bridging (started on 05/03/2017; ) follow-up to patient's hemoglobin trend. If there is any signs of further bleeding family has now agree for the patient to have colonoscopy. Discussion with palliative care unsuccessful patient remains full code and is no clear to the patient's husband the need for future decisions at this moment.   Consultants: GI Palliative Care  Procedures:  None   Antibiotics:  None   HPI/Subjective: Afebrile, no CP, no SOB, no nausea, no vomiting and no reporting further episodes of bloody stools.   Objective: Vitals:   05/05/17 1424 05/05/17 1432  BP: (!) 82/60 (!) 98/58  Pulse: 77   Resp: 18   Temp: 99.2 F (37.3 C)   SpO2:      Intake/Output Summary (Last 24 hours) at 05/05/17 1647 Last data filed at 05/05/17 0600  Gross per 24 hour  Intake              133 ml  Output              200 ml  Net              -67 ml   Filed Weights   05/03/17  0500 05/04/17 0442 05/05/17 0525  Weight: 62.3 kg (137 lb 6.4 oz) 62.6 kg (137 lb 14.4 oz) 60.1 kg (132 lb 9.6 oz)    Exam:   General: afebrile, no CP, no SOB, no nausea, no vomiting and no further bleeding reported.    Cardiovascular: rate controlled, positive metallic click, positive SEM   Respiratory: good air movement, no wheezing, no crackles.   Abdomen: soft, NT, ND, positive BS  Musculoskeletal: no edema, no  cyanosis, no crackles   Data Reviewed: Basic Metabolic Panel:  Recent Labs Lab 05/01/17 0408 05/04/17 0219 05/04/17 1030  NA 140 138  --   K 3.3* 3.8  --   CL 112* 109  --   CO2 21* 23  --   GLUCOSE 95 100*  --   BUN 8 10  --   CREATININE 0.78 0.96  --   CALCIUM 8.3* 8.4*  --   MG 1.7  --  1.8   CBC:  Recent Labs Lab 05/01/17 0408 05/02/17 0056 05/03/17 0224 05/04/17 0219 05/05/17 0137  WBC 4.6 5.8 5.3 3.9* 5.8  HGB 7.9* 8.8* 9.5* 8.3* 8.3*  HCT 24.4* 26.8* 29.3* 25.3* 26.3*  MCV 92.1 92.4 93.0 93.7 94.3  PLT 115* 110* 149* 127* 153     CBG:  Recent Labs Lab 05/04/17 0746 05/04/17 1140 05/04/17 1615 05/04/17 2140 05/05/17 1621  GLUCAP 104* 105* 125* 109* 128*    Recent Results (from the past 240 hour(s))  MRSA PCR Screening     Status: None   Collection Time: 04/28/17  8:08 AM  Result Value Ref Range Status   MRSA by PCR NEGATIVE NEGATIVE Final    Comment:        The GeneXpert MRSA Assay (FDA approved for NASAL specimens only), is one component of a comprehensive MRSA colonization surveillance program. It is not intended to diagnose MRSA infection nor to guide or monitor treatment for MRSA infections.      Studies: No results found.  Scheduled Meds: . carvedilol  3.125 mg Oral BID WC  . digoxin  0.125 mg Oral Daily  . warfarin  10 mg Oral ONCE-1800  . Warfarin - Pharmacist Dosing Inpatient   Does not apply q1800   Continuous Infusions: . heparin 650 Units/hr (05/05/17 0429)    Principal Problem:   Rectal bleeding Active Problems:   Nonischemic cardiomyopathy (HCC)   Atrial fibrillation (HCC)   Sick sinus syndrome (HCC)   Systolic CHF, chronic (HCC)   S/P Mechanical MVR (mitral valve replacement)   Dementia   ICD (implantable cardioverter-defibrillator), biventricular, in situ   Acute blood loss anemia    Time spent: 25 minutes    Barton Dubois  [facility] Pager [phone]. If 7PM-7AM, please contact  night-coverage at [email or web address], password [facility] 05/05/2017, 4:47 PM  LOS: 7 days

## 2017-05-05 NOTE — Progress Notes (Signed)
[place] for heparin and coumadin Indication: mechanical mitral valve  No Known Allergies  Patient Measurements: Height: 5\' 7"  (170.2 cm) Weight: 132 lb 9.6 oz (60.1 kg) IBW/kg (Calculated) : 61.6 Heparin Dosing Weight: 63.8 kg  Vital Signs: Temp: 98.7 F (37.1 C) (09/10 0525) Temp Source: Oral (09/10 0525) BP: 91/63 (09/10 1103) Pulse Rate: 75 (09/10 1103)  Labs:  Recent Labs  05/03/17 0224 05/04/17 0219  05/04/17 1416 05/05/17 0137 05/05/17 1102  HGB 9.5* 8.3*  --   --  8.3*  --   HCT 29.3* 25.3*  --   --  26.3*  --   PLT 149* 127*  --   --  153  --   LABPROT 14.1 16.5*  --   --  19.2*  --   INR 1.10 1.34  --   --  1.63  --   HEPARINUNFRC 0.30 0.28*  < > 0.37 0.26* 0.44  CREATININE  --  0.96  --   --   --   --   < > = values in this interval not displayed.  Estimated Creatinine Clearance: 49.5 mL/min (by C-G formula based on SCr of 0.96 mg/dL).  Assessment: 71 yr old female on warfarin prior to admission for hx mechanical mitral valve, afib, and DVT.  Admitted with bloody stools and elevated INR.  Per GI conservative management only. Bridging with heparin while on warfarin and will continue to aim for therapeutic INR 2.5-2.7.  Heparin level therapeutic at 0.44. INR trending up on increased dose.   Goal of Therapy:  Heparin level 0.3-0.5 INR goal 2.5-2.7 Monitor platelets by anticoagulation protocol: Yes   Plan:  Continue heparin at 650 units/hr as bridge therapy. Coumadin 10mg  PO x 1 tonight Daily INR, heparin level, and CBC  Kimberly Hammons, Pharm.D., BCPS Clinical Pharmacist Pager: [phone] Clinical phone for 05/05/2017 from 8:30-4:00 is [phone]. After 4pm, please call Main Rx [phone]) for assistance. 05/05/2017 1:15 PM

## 2017-05-06 LAB — CBC
HCT: 28.7 % — ABNORMAL LOW (ref 36.0–46.0)
Hemoglobin: 8.9 g/dL — ABNORMAL LOW (ref 12.0–15.0)
MCH: 29.5 pg (ref 26.0–34.0)
MCHC: 31 g/dL (ref 30.0–36.0)
MCV: 95 fL (ref 78.0–100.0)
Platelets: 172 10*3/uL (ref 150–400)
RBC: 3.02 MIL/uL — ABNORMAL LOW (ref 3.87–5.11)
RDW: 17.1 % — ABNORMAL HIGH (ref 11.5–15.5)
WBC: 4.4 10*3/uL (ref 4.0–10.5)

## 2017-05-06 LAB — GLUCOSE, CAPILLARY
Glucose-Capillary: 82 mg/dL (ref 65–99)
Glucose-Capillary: 109 mg/dL — ABNORMAL HIGH (ref 65–99)
Glucose-Capillary: 117 mg/dL — ABNORMAL HIGH (ref 65–99)
Glucose-Capillary: 104 mg/dL — ABNORMAL HIGH (ref 65–99)

## 2017-05-06 LAB — PROTIME-INR
INR: 1.79
Prothrombin Time: 20.7 seconds — ABNORMAL HIGH (ref 11.4–15.2)

## 2017-05-06 LAB — BASIC METABOLIC PANEL
Anion gap: 5 (ref 5–15)
BUN: 8 mg/dL (ref 6–20)
CO2: 25 mmol/L (ref 22–32)
Calcium: 8.7 mg/dL — ABNORMAL LOW (ref 8.9–10.3)
Chloride: 107 mmol/L (ref 101–111)
Creatinine, Ser: 0.89 mg/dL (ref 0.44–1.00)
GFR calc Af Amer: >60 mL/min (ref >60)
GFR calc non Af Amer: >60 mL/min (ref >60)
Glucose, Bld: 87 mg/dL (ref 65–99)
Potassium: 3.6 mmol/L (ref 3.5–5.1)
Sodium: 137 mmol/L (ref 135–145)

## 2017-05-06 LAB — MAGNESIUM: Magnesium: 1.8 mg/dL (ref 1.7–2.4)

## 2017-05-06 LAB — HEPARIN LEVEL (UNFRACTIONATED)
Heparin Unfractionated: 0.25 IU/mL — ABNORMAL LOW (ref 0.30–0.70)
Heparin Unfractionated: <0.1 IU/mL — ABNORMAL LOW (ref 0.30–0.70)

## 2017-05-06 MED ORDER — CARVEDILOL 3.125 MG PO TABS
3.1250 mg | ORAL_TABLET | Freq: Every day | ORAL | Status: DC
  Administered 2017-05-08: 3.125 mg via ORAL
  Filled 2017-05-06 (×2): qty 1

## 2017-05-06 MED ORDER — WARFARIN SODIUM 2.5 MG PO TABS
12.5000 mg | ORAL_TABLET | Freq: Once | ORAL | Status: AC
  Administered 2017-05-06: 12.5 mg via ORAL
  Filled 2017-05-06: qty 1

## 2017-05-06 NOTE — Care Management Note (Addendum)
Case Management Note  Patient Details  Name: Angela [name] MRN: [id number] Date of Birth: 1945-08-29  Subjective/Objective:  Pt presented for Lower GI Bleed - Hgb now stable. Hx of Mechanical valve awaiting therapeutic INR before d/c. Pt continues on IV Heparin gtt. Pt is from home with the support of husband. Pt may benefit from [facility] RN/ Aide once stable for d/c. Pt will benefit from PT/OT evaluation while hospitalized to decrease deconditioning.  PTA- pt ambulating with [place].                  Action/Plan: CM did provide pt with agency list for choice for HHRN/ Aide. CM will f/u in am in regards to decision for [place].    Expected Discharge Date:                  Expected Discharge Plan:  [place]  In-House Referral:  NA  Discharge planning Services  CM Consult  Post Acute Care Choice:  Home Health Choice offered to:  Patient, Spouse  DME Arranged:    DME Agency:     HH Arranged:  RN, Aide, PT HH Agency:   [facility]  Status of Service: Completed.   If discussed at [place] of Stay Meetings, dates discussed:    Additional Comments: 05-08-17 Jacqlyn Krauss, RN,BSN [phone] CM did speak with pt and she will not need 3n1. [facility] is aware that pt will d/c today. No further needs from CM at this time.    [id number] Jacqlyn Krauss, RN,BSN [phone] CM did speak pt and she wanted me to call the husband in regards to [place] did call husband this am in regards to choice. Family chose [facility]- Referral mad to Butch Penny with [facility] and SOC to begin within 24-48 hours post d/c. No further needs from CM at this time. Bethena Roys, RN 05/06/2017, 12:16 PM

## 2017-05-06 NOTE — Progress Notes (Signed)
[facility] PROGRESS NOTE  Angela [name] KGU:[id number] DOB: [birth year] DOA: 04/28/2017 PCP: Lorene Dy, MD  Interim summary and HPI 71 y.o. female with history of nonischemic cardiomyopathy status post ICD placement, atrial fibrillation, mechanical mitral valve, colon cancer in remission presents to the ER the patient had a large bloody bowel movement at midnight. Patient states that she woke up in the middle of the night to go to the bathroom and had a large bloody bowel which was painless. Had mild dizziness denies any chest pain or shortness of breath. Denies any abdominal pain nausea vomiting.  Assessment/Plan: 1-GI bleed: presumed to be lower GI bleed. -no further signs of acute bleeding appreciated. -Patient with very high thromboembolic risk off anticoagulation; but at the same time demonstrating/struggling with GIB. -will continue heparin and coumadin bridging; INR 1.79 (goal is 2.5--2.7) -continue to follow Hgb trend  -palliative care consultation unsuccessful and family not understanding big picture of patient's condition. -they have now expressed willing to pursuit colonoscopy if further bleeding presents.  2-A. Fib and mechanical valve -continue low dose b-blocker and the use of digoxin for rate control. -continue telemetry monitoring  -CHADsVASC score 4 -will continue heparin/coumadin bridging; INR 1.6 -follow Hgb trend    3-non-ischemic cardiomyopathy/sick sinus syndrome   -s/p ICD -follow daily weights and strict I's/O's -no signs of fluid overload  -continue low dose B-blocker (3.125 daily)  -Patient has soft blood pressure. -asymptomatic and euvolemic -transient runs of V-tac has continue intermittently; normal K and Mg.  4-dementia -poor overall insight  -no agitation -will continue supportive care  5-acute blood loss anemia -due to GI bleed -Hgb down to 7.9; from 12.4; has received 1 unit of PRBC  -Hgb has remained at 8.9 today. -Will follow  hemoglobin trend -patient currently on heparin drip and coumadin bridging.  -No signs of further bleeding   6-hx of colon cancer  -appears to be in remission -Palliative care has met with family and was not successful in determining advance care planning and future goals of care.  -Family has expressed to the palliative care service that if the patient continued to experience dropping in her hemoglobin or experienced further bleeding that they will like to pursuit colonoscopy.   Code Status: Full Family Communication: husband at bedside  Disposition Plan: to be determined. Will continue patient on heparin, a Coumadin bridging (started on 05/03/2017; ) follow-up to patient's hemoglobin trend. If there is any signs of further bleeding family has now agree for the patient to have colonoscopy. Discussion with palliative care unsuccessful patient remains full code and is no clear to the patient's husband the need for future decisions at this moment.   Consultants: GI Palliative Care  Procedures:  None   Antibiotics:  None   HPI/Subjective: Afebrile, no CP, no SOB, no nausea, no vomiting. Patient and nursing staff denying any further episodes of blood in her stools.   Objective: Vitals:   05/06/17 1647 05/06/17 1648  BP:  (!) 89/56  Pulse:  78  Resp:  (!) 23  Temp: 98.2 F (36.8 C)   SpO2:  100%    Intake/Output Summary (Last 24 hours) at 05/06/17 1729 Last data filed at 05/06/17 1500  Gross per 24 hour  Intake              579 ml  Output              300 ml  Net  279 ml   Filed Weights   05/04/17 0442 05/05/17 0525 05/06/17 0424  Weight: 62.6 kg (137 lb 14.4 oz) 60.1 kg (132 lb 9.6 oz) 60.1 kg (132 lb 9.6 oz)    Exam:   General: afebrile, no CP, no SOB, no nausea, no vomiting. Patient reported mild HA and some cramps on her RLE.   Rest of physical exam unchanged from examination on 05/05/17; see below for details:  Cardiovascular: rate controlled,  positive metallic click, positive SEM   Respiratory: good air movement, no wheezing, no crackles.   Abdomen: soft, NT, ND, positive BS  Musculoskeletal: no edema, no cyanosis, no crackles   Data Reviewed: Basic Metabolic Panel:  Recent Labs Lab 05/01/17 0408 05/04/17 0219 05/04/17 1030 05/06/17 0430  NA 140 138  --  137  K 3.3* 3.8  --  3.6  CL 112* 109  --  107  CO2 21* 23  --  25  GLUCOSE 95 100*  --  87  BUN 8 10  --  8  CREATININE 0.78 0.96  --  0.89  CALCIUM 8.3* 8.4*  --  8.7*  MG 1.7  --  1.8 1.8   CBC:  Recent Labs Lab 05/02/17 0056 05/03/17 0224 05/04/17 0219 05/05/17 0137 05/06/17 0430  WBC 5.8 5.3 3.9* 5.8 4.4  HGB 8.8* 9.5* 8.3* 8.3* 8.9*  HCT 26.8* 29.3* 25.3* 26.3* 28.7*  MCV 92.4 93.0 93.7 94.3 95.0  PLT 110* 149* 127* 153 172     CBG:  Recent Labs Lab 05/05/17 1621 05/05/17 2144 05/06/17 0719 05/06/17 1130 05/06/17 1646  GLUCAP 128* 105* 82 109* 117*    Recent Results (from the past 240 hour(s))  MRSA PCR Screening     Status: None   Collection Time: 04/28/17  8:08 AM  Result Value Ref Range Status   MRSA by PCR NEGATIVE NEGATIVE Final    Comment:        The GeneXpert MRSA Assay (FDA approved for NASAL specimens only), is one component of a comprehensive MRSA colonization surveillance program. It is not intended to diagnose MRSA infection nor to guide or monitor treatment for MRSA infections.      Studies: No results found.  Scheduled Meds: . [START ON 05/07/2017] carvedilol  3.125 mg Oral Daily  . digoxin  0.125 mg Oral Daily  . Warfarin - Pharmacist Dosing Inpatient   Does not apply q1800   Continuous Infusions: . heparin 850 Units/hr (05/06/17 1655)    Principal Problem:   Rectal bleeding Active Problems:   Nonischemic cardiomyopathy (HCC)   Atrial fibrillation (HCC)   Sick sinus syndrome (HCC)   Systolic CHF, chronic (HCC)   S/P Mechanical MVR (mitral valve replacement)   Dementia   ICD (implantable  cardioverter-defibrillator), biventricular, in situ   Acute blood loss anemia    Time spent: 25 minutes    Barton Dubois  [facility] Pager [phone]. If 7PM-7AM, please contact night-coverage at [email or web address], password [facility] 05/06/2017, 5:29 PM  LOS: 8 days

## 2017-05-06 NOTE — Progress Notes (Addendum)
[place] for heparin and coumadin Indication: mechanical mitral valve  No Known Allergies  Patient Measurements: Height: 5\' 7"  (170.2 cm) Weight: 132 lb 9.6 oz (60.1 kg) IBW/kg (Calculated) : 61.6 Heparin Dosing Weight: 63.8 kg  Vital Signs: Temp: 98.2 F (36.8 C) (09/11 1647) Temp Source: Oral (09/11 1640) BP: 84/51 (09/11 1640) Pulse Rate: 72 (09/11 1640)  Labs:  Recent Labs  05/04/17 0219  05/05/17 0137 05/05/17 1102 05/06/17 0430 05/06/17 1428  HGB 8.3*  --  8.3*  --  8.9*  --   HCT 25.3*  --  26.3*  --  28.7*  --   PLT 127*  --  153  --  172  --   LABPROT 16.5*  --  19.2*  --  20.7*  --   INR 1.34  --  1.63  --  1.79  --   HEPARINUNFRC 0.28*  < > 0.26* 0.44 0.25* <0.10*  CREATININE 0.96  --   --   --  0.89  --   < > = values in this interval not displayed.  Estimated Creatinine Clearance: 53.4 mL/min (by C-G formula based on SCr of 0.89 mg/dL).  Assessment: 71 yr old female on warfarin prior to admission for hx mechanical mitral valve, afib, and DVT.  Admitted with bloody stools and elevated INR.  Per GI conservative management only. Bridging with heparin while on warfarin and will continue to aim for therapeutic INR 2.5-2.7.  Heparin level now undetectable on 700 units/hr.  Surprising given heparin was at least detectable on previous rates.  Spoke with RN, no IV issues.    INR slowly trending up on increased dose.   Hgb holding between 8-9 with no bleeding noted.  Goal of Therapy:  Heparin level 0.3-0.5 INR goal 2.5-2.7 Monitor platelets by anticoagulation protocol: Yes   Plan:  Increase heparin to 850 units/hr as bridge therapy. Coumadin 12.5 mg PO x 1 tonight Repeat heparin level at 2300 Daily INR, heparin level, and CBC  Kimberly Hammons, Pharm.D., BCPS Clinical Pharmacist Pager: [phone] Clinical phone for 05/06/2017 from 8:30-4:00 is [phone]. After 4pm, please call Main Rx ([phone]) for  assistance. 05/06/2017 4:48 PM

## 2017-05-06 NOTE — Care Management Important Message (Signed)
Important Message  Patient Details  Name: Angela [name] MRN: [id number] Date of Birth: [birth year]   Medicare Important Message Given:  Yes    [name] May 05/06/2017, 9:37 AM

## 2017-05-06 NOTE — Progress Notes (Signed)
Updated report received via [place] RN and she also placed a new IV site with Heparin and NSS running without difficulty, reviewed events of the day, new orders and POC, assumed care of patient.

## 2017-05-06 NOTE — Progress Notes (Signed)
ANTICOAGULATION CONSULT NOTE - Follow Up Consult  Pharmacy Consult for heparin Indication: MVR  Labs:  Recent Labs  05/04/17 0219  05/05/17 0137 05/05/17 1102 05/06/17 0430  HGB 8.3*  --  8.3*  --  8.9*  HCT 25.3*  --  26.3*  --  28.7*  PLT 127*  --  153  --  172  LABPROT 16.5*  --  19.2*  --  20.7*  INR 1.34  --  1.63  --  1.79  HEPARINUNFRC 0.28*  < > 0.26* 0.44 0.25*  CREATININE 0.96  --   --   --   --   < > = values in this interval not displayed.   Assessment: 71yo female now slightly subtherapeutic on heparin after one level at goal.  Goal of Therapy:  Heparin level 0.3-0.5 units/ml   Plan:  Will increase heparin gtt slightly to 700 units/hr and check level in [place], PharmD, BCPS  05/06/2017,5:46 AM

## 2017-05-07 DIAGNOSIS — I482 Chronic atrial fibrillation: Secondary | ICD-10-CM

## 2017-05-07 DIAGNOSIS — I428 Other cardiomyopathies: Secondary | ICD-10-CM

## 2017-05-07 DIAGNOSIS — I5022 Chronic systolic (congestive) heart failure: Secondary | ICD-10-CM

## 2017-05-07 DIAGNOSIS — I495 Sick sinus syndrome: Secondary | ICD-10-CM

## 2017-05-07 DIAGNOSIS — Z952 Presence of prosthetic heart valve: Secondary | ICD-10-CM

## 2017-05-07 LAB — CBC
HCT: 27.7 % — ABNORMAL LOW (ref 36.0–46.0)
Hemoglobin: 8.7 g/dL — ABNORMAL LOW (ref 12.0–15.0)
MCH: 29.8 pg (ref 26.0–34.0)
MCHC: 31.4 g/dL (ref 30.0–36.0)
MCV: 94.9 fL (ref 78.0–100.0)
Platelets: 191 10*3/uL (ref 150–400)
RBC: 2.92 MIL/uL — ABNORMAL LOW (ref 3.87–5.11)
RDW: 16.7 % — ABNORMAL HIGH (ref 11.5–15.5)
WBC: 4.7 10*3/uL (ref 4.0–10.5)

## 2017-05-07 LAB — GLUCOSE, CAPILLARY
Glucose-Capillary: 102 mg/dL — ABNORMAL HIGH (ref 65–99)
Glucose-Capillary: 111 mg/dL — ABNORMAL HIGH (ref 65–99)
Glucose-Capillary: 130 mg/dL — ABNORMAL HIGH (ref 65–99)

## 2017-05-07 LAB — HEPARIN LEVEL (UNFRACTIONATED)
Heparin Unfractionated: 0.32 IU/mL (ref 0.30–0.70)
Heparin Unfractionated: <0.1 IU/mL — ABNORMAL LOW (ref 0.30–0.70)
Heparin Unfractionated: 0.25 IU/mL — ABNORMAL LOW (ref 0.30–0.70)

## 2017-05-07 LAB — PROTIME-INR
INR: 2.19
Prothrombin Time: 24.2 seconds — ABNORMAL HIGH (ref 11.4–15.2)

## 2017-05-07 MED ORDER — WARFARIN SODIUM 10 MG PO TABS
10.0000 mg | ORAL_TABLET | Freq: Once | ORAL | Status: AC
  Administered 2017-05-07: 10 mg via ORAL
  Filled 2017-05-07: qty 1

## 2017-05-07 NOTE — Progress Notes (Addendum)
ANTICOAGULATION CONSULT NOTE - Follow Up Consult  Pharmacy Consult for heparin Indication: MVR  Labs:  Recent Labs  05/04/17 0219  05/05/17 0137  05/06/17 0430 05/06/17 1428 05/06/17 2327  HGB 8.3*  --  8.3*  --  8.9*  --   --   HCT 25.3*  --  26.3*  --  28.7*  --   --   PLT 127*  --  153  --  172  --   --   LABPROT 16.5*  --  19.2*  --  20.7*  --   --   INR 1.34  --  1.63  --  1.79  --   --   HEPARINUNFRC 0.28*  < > 0.26*  < > 0.25* <0.10* 0.32  CREATININE 0.96  --   --   --  0.89  --   --   < > = values in this interval not displayed.   Assessment/Plan:  71yo female therapeutic on heparin after rate changes. Will continue gtt at current rate and confirm stable with am labs.   Wynona Neat, PharmD, BCPS  05/07/2017,12:19 AM   ADDENDUM: AM heparin level undetectable, no gtt issues per RN.  Will increase heparin gtt by 2-3 units/kg/hr to 1000 units/hr and check level in 8hr. VB  6:44 AM

## 2017-05-07 NOTE — Progress Notes (Signed)
[place] for heparin and coumadin Indication: mechanical mitral valve  No Known Allergies  Patient Measurements: Height: 5\' 7"  (170.2 cm) Weight: 135 lb (61.2 kg) IBW/kg (Calculated) : 61.6 Heparin Dosing Weight: 63.8 kg  Vital Signs: Temp: 97.7 F (36.5 C) (09/12 1103) Temp Source: Oral (09/12 1103) BP: 85/59 (09/12 1104) Pulse Rate: 74 (09/12 1103)  Labs:  Recent Labs  05/05/17 0137  05/06/17 0430  05/06/17 2327 05/07/17 0310 05/07/17 1358  HGB 8.3*  --  8.9*  --   --  8.7*  --   HCT 26.3*  --  28.7*  --   --  27.7*  --   PLT 153  --  172  --   --  191  --   LABPROT 19.2*  --  20.7*  --   --  24.2*  --   INR 1.63  --  1.79  --   --  2.19  --   HEPARINUNFRC 0.26*  < > 0.25*  < > 0.32 <0.10* 0.25*  CREATININE  --   --  0.89  --   --   --   --   < > = values in this interval not displayed.  Estimated Creatinine Clearance: 54.4 mL/min (by C-G formula based on SCr of 0.89 mg/dL).  Assessment: 71 yr old female on warfarin prior to admission for hx mechanical mitral valve, afib, and DVT.  Admitted with bloody stools and elevated INR.  Per GI conservative management only. Bridging with heparin while on warfarin and will continue to aim for therapeutic INR 2.5-2.7. -heparin level 0.25 after increase to 1000 units/hr INR= 2.19 (trend up)  Home coumadin: 8mg /d except 9mg  on Mon   Goal of Therapy:  Heparin level 0.3-0.5 INR goal 2.5-2.7 Monitor platelets by anticoagulation protocol: Yes   Plan:  -Coumadin 10mg  po today -Increase heparin to 1100 units/hr -Heparin level in 6 hours and daily wth CBC daily  Hildred Laser, Pharm D 05/07/2017 3:32 PM

## 2017-05-07 NOTE — Progress Notes (Signed)
[facility] PROGRESS NOTE  Angela [name] IYM:[id number] DOB: 01/31/1946 DOA: 04/28/2017 PCP: Lorene Dy, MD  Interim summary and HPI 71 y.o. female with history of nonischemic cardiomyopathy status post ICD placement, atrial fibrillation, mechanical mitral valve, colon cancer in remission presents to the ER the patient had a large bloody bowel movement at midnight. Patient states that she woke up in the middle of the night to go to the bathroom and had a large bloody bowel which was painless. Had mild dizziness denies any chest pain or shortness of breath. Denies any abdominal pain nausea vomiting.  Assessment/Plan: 1-GI bleed: presumed to be lower GI bleed. -no further signs of acute bleeding appreciated. -Patient with very high thromboembolic risk off anticoagulation; but at the same time demonstrating/struggling with GIB. -will continue heparin and coumadin bridging; INR 2.1 (goal is 2.5--2.7) -continue to follow Hgb trend  -palliative care consultation unsuccessful and family not understanding big picture of patient's condition. -they have now expressed willing to pursuit colonoscopy if further bleeding presents.  2-A. Fib and mechanical valve -continue low dose b-blocker and the use of digoxin for rate control. -continue telemetry monitoring  -CHADsVASC score 4 -will continue heparin/coumadin bridging; INR 2.1 -follow Hgb trend    3-non-ischemic cardiomyopathy/sick sinus syndrome   -s/p ICD -follow daily weights and strict I's/O's -no signs of fluid overload  -continue low dose B-blocker (3.125 daily)  -Patient has soft blood pressure. -asymptomatic and euvolemic -transient runs of V-tac has continue intermittently; normal K and Mg.  4-dementia -poor overall insight  -no agitation -will continue supportive care  5-acute blood loss anemia -due to GI bleed -Hgb down to 7.9; from 12.4; has received 1 unit of PRBC  -Hgb has remained at 8.9 today. -Will follow  hemoglobin trend -patient currently on heparin drip and coumadin bridging.  -No signs of further bleeding   6-hx of colon cancer  -Palliative care has met with family and was not successful in determining advance care planning and future goals of care.  -Family has expressed to the palliative care service that if the patient continued to experience dropping in her hemoglobin or experienced further bleeding that they will like to pursuit colonoscopy.   Code Status: Full Family Communication: husband at bedside  Disposition Plan: Possible DC 9/13  Consultants: GI Palliative Care  Procedures:  None   Antibiotics:  None   HPI/Subjective: Pt denies blood in stools, no complaints   Objective: Vitals:   05/07/17 1103 05/07/17 1104  BP: (!) 80/62 (!) 85/59  Pulse: 74   Resp: 18 16  Temp: 97.7 F (36.5 C)   SpO2: 99%     Intake/Output Summary (Last 24 hours) at 05/07/17 1203 Last data filed at 05/07/17 1000  Gross per 24 hour  Intake              463 ml  Output              550 ml  Net              -87 ml   Filed Weights   05/05/17 0525 05/06/17 0424 05/07/17 0429  Weight: 60.1 kg (132 lb 9.6 oz) 60.1 kg (132 lb 9.6 oz) 61.2 kg (135 lb)    Exam:   General: awake, alert, NAD.   HEENT: NCAT    Cardiovascular: rate controlled, positive metallic click, positive systolic murmur   Respiratory: good air movement, no wheezing, no crackles.   Abdomen: soft, NT, ND, positive BS  Musculoskeletal: no edema,  no cyanosis, no crackles   Neuro: nonfocal   Data Reviewed: Basic Metabolic Panel:  Recent Labs Lab 05/01/17 0408 05/04/17 0219 05/04/17 1030 05/06/17 0430  NA 140 138  --  137  K 3.3* 3.8  --  3.6  CL 112* 109  --  107  CO2 21* 23  --  25  GLUCOSE 95 100*  --  87  BUN 8 10  --  8  CREATININE 0.78 0.96  --  0.89  CALCIUM 8.3* 8.4*  --  8.7*  MG 1.7  --  1.8 1.8   CBC:  Recent Labs Lab 05/03/17 0224 05/04/17 0219 05/05/17 0137 05/06/17 0430  05/07/17 0310  WBC 5.3 3.9* 5.8 4.4 4.7  HGB 9.5* 8.3* 8.3* 8.9* 8.7*  HCT 29.3* 25.3* 26.3* 28.7* 27.7*  MCV 93.0 93.7 94.3 95.0 94.9  PLT 149* 127* 153 172 191   CBG:  Recent Labs Lab 05/06/17 1130 05/06/17 1646 05/06/17 2134 05/07/17 0729 05/07/17 1101  GLUCAP 109* 117* 104* 102* 111*    Recent Results (from the past 240 hour(s))  MRSA PCR Screening     Status: None   Collection Time: 04/28/17  8:08 AM  Result Value Ref Range Status   MRSA by PCR NEGATIVE NEGATIVE Final    Comment:        The GeneXpert MRSA Assay (FDA approved for NASAL specimens only), is one component of a comprehensive MRSA colonization surveillance program. It is not intended to diagnose MRSA infection nor to guide or monitor treatment for MRSA infections.     Studies: No results found.  Scheduled Meds: . carvedilol  3.125 mg Oral Daily  . digoxin  0.125 mg Oral Daily  . Warfarin - Pharmacist Dosing Inpatient   Does not apply q1800   Continuous Infusions: . heparin 1,000 Units/hr (05/07/17 8242)   Principal Problem:   Rectal bleeding Active Problems:   Nonischemic cardiomyopathy (HCC)   Atrial fibrillation (HCC)   Sick sinus syndrome (HCC)   Systolic CHF, chronic (HCC)   S/P Mechanical MVR (mitral valve replacement)   Dementia   ICD (implantable cardioverter-defibrillator), biventricular, in situ   Acute blood loss anemia  Time spent: 25 minutes  Irwin Brakeman MD  [facility] Pager [phone]. If 7PM-7AM, please contact night-coverage at [email or web address], password [facility] 05/07/2017, 12:03 PM  LOS: 9 days

## 2017-05-07 NOTE — Progress Notes (Signed)
Cardiac Monitoring Event  Dysrhythmia:  7 beats VTACH  Symptoms:  Asymptomatic   Level of Consciousness:  Alert   Last set of vital signs taken:  Temp: 98.6 F (37 C)  Pulse Rate: (!) 36  Resp: (!) 23  BP: 92/72  SpO2: 100 %  Name of MD Notified: K. Shorr, NP-[facility]   Time MD Notified:  0814  Comments/Actions Taken:  Awaiting call back.

## 2017-05-07 NOTE — Evaluation (Signed)
Physical Therapy Evaluation Patient Details Name: Angela [name] MRN: [id number] DOB: [birth year] Today's Date: 05/07/2017   History of Present Illness  71 y.o. female with history of nonischemic cardiomyopathy status post ICD placement, atrial fibrillation, mechanical mitral valve, colon cancer in remission presents to the ER the patient had a large bloody bowel movement presumed to be lower GI bleed  Clinical Impression   Pt admitted with above diagnosis. Pt currently with functional limitations due to the deficits listed below (see PT Problem List). Currently LLE pain is limiting mobility, transfers, amb; Informed Dr. Wynetta Emery via text page re: mobility status and LLE pain; Angela [name] was hesitant to use RW this session, but tells me she is willing to try it next session; Pt will benefit from skilled PT to increase their independence and safety with mobility to allow discharge to the venue listed below.       Follow Up Recommendations Home health PT;Supervision/Assistance - 24 hour;Other (comment) (Agree with HHAide and RN)    Equipment Recommendations  3in1 (PT)    Recommendations for Other Services       Precautions / Restrictions Precautions Precautions: Fall      Mobility  Bed Mobility Overal bed mobility: Needs Assistance Bed Mobility: Rolling;Sidelying to Sit Rolling: Min assist Sidelying to sit: Mod assist       General bed mobility comments: Cues for techqniue; light mod assist to elevate trunk to sit  Transfers Overall transfer level: Needs assistance Equipment used: Quad cane Transfers: Sit to/from Stand Sit to Stand: Min assist         General transfer comment: Min assist to steady; accepting very little weight on LLE  Ambulation/Gait Ambulation/Gait assistance: Mod assist Ambulation Distance (Feet):  (Pivotal steps bed to chair) Assistive device: Quad cane Gait Pattern/deviations: Decreased stance time - left;Decreased step length - right     General  Gait Details: Painful LLE with weight bearing; Offered RW and explained its use to unweigh painful LLE, but pt declined  Financial trader Rankin (Stroke Patients Only)       Balance Overall balance assessment: Needs assistance Sitting-balance support: Bilateral upper extremity supported;Feet supported Sitting balance-Leahy Scale: Fair       Standing balance-Leahy Scale: Poor                               Pertinent Vitals/Pain Pain Assessment: Faces Faces Pain Scale: Hurts even more Pain Location: LLE, lower leg, painful knee active flex/extend, but full range Pain Descriptors / Indicators: Grimacing;Guarding Pain Intervention(s): Limited activity within patient's tolerance;Monitored during session;Repositioned;Other (comment) (paged MD re: pain limiting mobility)    Home Living Family/patient expects to be discharged to:: Private residence Living Arrangements: Spouse/significant other;Children Available Help at Discharge: Family;Available 24 hours/day Type of Home: House Home Access: Stairs to enter Entrance Stairs-Rails: Right Entrance Stairs-Number of Steps: 2 Home Layout: One level Home Equipment: Walker - 2 wheels;Cane - quad      Prior Function Level of Independence: Independent with assistive device(s)         Comments: Uses small based quad cane for amb     Hand Dominance        Extremity/Trunk Assessment   Upper Extremity Assessment Upper Extremity Assessment: Overall WFL for tasks assessed    Lower Extremity Assessment Lower Extremity Assessment: LLE deficits/detail LLE Deficits / Details: Pt reports pain  LLE, lower leg; pain increases with weight bearing; full knee flexion, extension, just painful; no gross edema, heat, or erythema compared to R       Communication   Communication: No difficulties  Cognition Arousal/Alertness: Awake/alert Behavior During Therapy: WFL for tasks  assessed/performed Overall Cognitive Status: Within Functional Limits for tasks assessed (for simple mobility tasks)                                        General Comments      Exercises     Assessment/Plan    PT Assessment Patient needs continued PT services  PT Problem List Decreased strength;Decreased range of motion;Decreased activity tolerance;Decreased balance;Decreased mobility;Decreased coordination;Decreased cognition;Decreased knowledge of use of DME;Decreased safety awareness;Cardiopulmonary status limiting activity;Decreased knowledge of precautions       PT Treatment Interventions DME instruction;Gait training;Stair training;Functional mobility training;Therapeutic activities;Therapeutic exercise;Balance training;Patient/family education    PT Goals (Current goals can be found in the Care Plan section)  Acute Rehab PT Goals Patient Stated Goal: Hopes to be home soon PT Goal Formulation: With patient Time For Goal Achievement: 05/21/17 Potential to Achieve Goals: Good    Frequency Min 3X/week   Barriers to discharge        Co-evaluation               AM-PAC PT "6 Clicks" Daily Activity  Outcome Measure Difficulty turning over in bed (including adjusting bedclothes, sheets and blankets)?: A Lot Difficulty moving from lying on back to sitting on the side of the bed? : Unable Difficulty sitting down on and standing up from a chair with arms (e.g., wheelchair, bedside commode, etc,.)?: Unable Help needed moving to and from a bed to chair (including a wheelchair)?: A Little Help needed walking in hospital room?: A Lot Help needed climbing 3-5 steps with a railing? : A Lot 6 Click Score: 11    End of Session Equipment Utilized During Treatment: Gait belt Activity Tolerance: Patient limited by pain Patient left: in chair;with call bell/phone within reach;with family/visitor present Nurse Communication: Mobility status PT Visit Diagnosis:  Unsteadiness on feet (R26.81);Other abnormalities of gait and mobility (R26.89);Pain Pain - Right/Left: Left Pain - part of body: Leg    Time: 3976-7341 PT Time Calculation (min) (ACUTE ONLY): 27 min   Charges:   PT Evaluation $PT Eval Moderate Complexity: 1 Mod PT Treatments $Gait Training: 8-22 mins   PT G Codes:        Roney Marion, PT  Acute Rehabilitation Services Pager [phone] Office [phone]   [place] 05/07/2017, 4:18 PM

## 2017-05-08 ENCOUNTER — Encounter (HOSPITAL_COMMUNITY): Payer: Self-pay | Admitting: Family Medicine

## 2017-05-08 DIAGNOSIS — Z95810 Presence of automatic (implantable) cardiac defibrillator: Secondary | ICD-10-CM

## 2017-05-08 LAB — BASIC METABOLIC PANEL
Anion gap: 9 (ref 5–15)
BUN: 8 mg/dL (ref 6–20)
CO2: 23 mmol/L (ref 22–32)
Calcium: 9 mg/dL (ref 8.9–10.3)
Chloride: 105 mmol/L (ref 101–111)
Creatinine, Ser: 0.83 mg/dL (ref 0.44–1.00)
GFR calc Af Amer: >60 mL/min (ref >60)
GFR calc non Af Amer: >60 mL/min (ref >60)
Glucose, Bld: 100 mg/dL — ABNORMAL HIGH (ref 65–99)
Potassium: 4 mmol/L (ref 3.5–5.1)
Sodium: 137 mmol/L (ref 135–145)

## 2017-05-08 LAB — GLUCOSE, CAPILLARY
Glucose-Capillary: 122 mg/dL — ABNORMAL HIGH (ref 65–99)
Glucose-Capillary: 88 mg/dL (ref 65–99)
Glucose-Capillary: 100 mg/dL — ABNORMAL HIGH (ref 65–99)

## 2017-05-08 LAB — PROTIME-INR
INR: 2.49
Prothrombin Time: 26.7 seconds — ABNORMAL HIGH (ref 11.4–15.2)

## 2017-05-08 LAB — HEPARIN LEVEL (UNFRACTIONATED)
Heparin Unfractionated: 0.7 IU/mL (ref 0.30–0.70)
Heparin Unfractionated: 1.18 IU/mL — ABNORMAL HIGH (ref 0.30–0.70)

## 2017-05-08 LAB — CBC
HCT: 31.7 % — ABNORMAL LOW (ref 36.0–46.0)
Hemoglobin: 9.8 g/dL — ABNORMAL LOW (ref 12.0–15.0)
MCH: 29.2 pg (ref 26.0–34.0)
MCHC: 30.9 g/dL (ref 30.0–36.0)
MCV: 94.3 fL (ref 78.0–100.0)
Platelets: 252 10*3/uL (ref 150–400)
RBC: 3.36 MIL/uL — ABNORMAL LOW (ref 3.87–5.11)
RDW: 16.4 % — ABNORMAL HIGH (ref 11.5–15.5)
WBC: 6.4 10*3/uL (ref 4.0–10.5)

## 2017-05-08 MED ORDER — WARFARIN SODIUM 4 MG PO TABS: 8.0000 mg | ORAL_TABLET | Freq: Every day | ORAL | 0 refills | Status: DC

## 2017-05-08 MED ORDER — WARFARIN SODIUM 5 MG PO TABS: 5.0000 mg | ORAL_TABLET | Freq: Once | ORAL | Status: DC

## 2017-05-08 MED ORDER — HEPARIN (PORCINE) IN NACL 100-0.45 UNIT/ML-% IJ SOLN
1050.0000 [IU]/h | INTRAMUSCULAR | Status: DC
  Filled 2017-05-08: qty 250

## 2017-05-08 NOTE — Discharge Summary (Signed)
Physician Discharge Summary  Angela [name] OAC:[id number] DOB: 1946-03-24 DOA: 04/28/2017  PCP: Lorene Dy, MD  Admit date: 04/28/2017 Discharge date: 05/08/2017  Admitted From: Home  Disposition: Home   Recommendations for Outpatient Follow-up:  1. Follow up with PCP in 1 weeks 2. Follow up with cardiology in 1-2 weeks 3. Follow up with GI in 2 weeks 4. Please obtain BMP/CBC in one week 5. Please check PT/INR every 2 days, x 4 then weekly 6. Home health nurse to check PT/INR every 2 days x 4 and report to PCP and cardiology  Home Health: YES  Discharge Condition: STABLE   CODE STATUS: FULL    Brief Hospitalization Summary: Please see all hospital notes, images, labs for full details of the hospitalization.  HPI: Angela [name] is a 71 y.o. female with history of nonischemic cardiomyopathy status post ICD placement, atrial fibrillation, mechanical mitral valve, colon cancer in remission presents to the ER the patient had a large bloody bowel movement at midnight. Patient states that she woke up in the middle of the night to go to the bathroom and had a large bloody bowel which was painless. Had mild dizziness denies any chest pain or shortness of breath. Denies any abdominal pain nausea vomiting.  ED Course: In the ER abdomen appeared benign. Patient's blood pressure is in the low normal which is usual for the patient. Hemoglobin was around 12.4 with lactate of 1.14. Creatinine is mildly elevated at 1.1. INR was 4.3 and your physician had given vitamin K 5 mg IV. Patient is being admitted for further observation of rectal bleeding.  Interim summary and HPI 71 y.o.femalewith history of nonischemic cardiomyopathy status post ICD placement, atrial fibrillation, mechanical mitral valve, colon cancer in remission presents to the ER the patient had a large bloody bowel movement at midnight. Patient states that she woke up in the middle of the night to go to the bathroom and had a large bloody  bowel which was painless. Had mild dizziness denies any chest pain or shortness of breath. Denies any abdominal pain nausea vomiting.  Assessment/Plan: 1-GI bleed: presumed to be lower GI bleed. -no further signs of acute bleeding appreciated. -Patient with very high thromboembolic risk off anticoagulation; but at the same time demonstrating/struggling with GIB. -will continue heparin and coumadin bridging; INR 2.1 (goal is 2.5--2.7) -Hgb trend has been stable, recheck in 1 week with CBC outpatient -palliative care consultation unsuccessful and family not understanding big picture of patient's condition. -they have now expressed willing to pursuit colonoscopy if further bleeding presents.  Outpatient GI follow up recommended.   2-A. Fib and mechanical valve -continue low dose b-blocker and the use of digoxin for rate control. -continue telemetry monitoring  -CHADsVASC score 4 -will continue heparin/coumadin bridging; INR 2.5 -Home health nurse to check PT/INR every 2 days x 4 and report results to PCP  3-non-ischemic cardiomyopathy/sick sinus syndrome   -s/p ICD -follow daily weights and strict I's/O's -no signs of fluid overload  -continue low dose B-blocker (3.125 daily)  -Patient has soft blood pressure. -asymptomatic and euvolemic -transient runs of V-tac has continue intermittently and asymptomatic; normal K and Mg.  4-dementia -poor overall insight  -no agitation -will continue supportive care  5-acute blood loss anemia -due to GI bleed -Hgb down to 7.9; from 12.4; has received 1 unit of PRBC  -Hgb has remained at 8.9 today. -Will follow hemoglobin trend -patient currently on heparin drip and coumadin bridging.  -No signs of further bleeding   6-hx  of colon cancer  -Palliative care has met with family and was not successful in determining advance care planning and future goals of care.  -Family has expressed to the palliative care service that if the patient  continued to experience dropping in her hemoglobin or experienced further bleeding that they will like to pursue colonoscopy.  Outpatient GI follow up recommended.  No further bleeding episode in hospital.    Code Status: Full Family Communication: husband at bedside  Disposition Plan:  DC 9/13  Consultants: GI Palliative Care  Procedures:  None   Antibiotics:  None   Discharge Diagnoses:  Principal Problem:   Rectal bleeding Active Problems:   Nonischemic cardiomyopathy (HCC)   Atrial fibrillation (HCC)   Sick sinus syndrome (HCC)   Systolic CHF, chronic (HCC)   S/P Mechanical MVR (mitral valve replacement)   Dementia   ICD (implantable cardioverter-defibrillator), biventricular, in situ   Acute blood loss anemia  Discharge Instructions: Discharge Instructions    Call MD for:  difficulty breathing, headache or visual disturbances    Complete by:  As directed    Call MD for:  extreme fatigue    Complete by:  As directed    Call MD for:  persistant dizziness or light-headedness    Complete by:  As directed    Call MD for:  persistant nausea and vomiting    Complete by:  As directed    Call MD for:  severe uncontrolled pain    Complete by:  As directed    Call MD for:  temperature >100.4    Complete by:  As directed    Diet - low sodium heart healthy    Complete by:  As directed    Increase activity slowly    Complete by:  As directed      Allergies as of 05/08/2017   No Known Allergies     Medication List    STOP taking these medications   spironolactone 25 MG tablet Commonly known as:  ALDACTONE     TAKE these medications   alendronate 70 MG tablet Commonly known as:  FOSAMAX Take 70 mg by mouth once a week. Take with a full glass of water on an empty stomach.   carvedilol 3.125 MG tablet Commonly known as:  COREG TAKE ONE (1) TABLET BY MOUTH TWO (2) TIMES DAILY WITH A MEAL   cholecalciferol 1000 units tablet Commonly known as:  VITAMIN  D Take 2,000 Units by mouth daily.   digoxin 0.125 MG tablet Commonly known as:  LANOXIN Take 0.125 mg by mouth daily.   furosemide 40 MG tablet Commonly known as:  LASIX TAKE ONE (1) TABLET BY MOUTH EVERY DAY. IF WEIGHT IS GREATER THAN 140LBS ON HOME SCALE, TAKE 2 TABLETS ('80MG'$ ) What changed:  how much to take  how to take this  when to take this  reasons to take this  additional instructions   meclizine 25 MG tablet Commonly known as:  ANTIVERT Take 25 mg by mouth 2 (two) times daily as needed for dizziness or nausea.   pentoxifylline 400 MG CR tablet Commonly known as:  TRENTAL Take 400 mg by mouth daily as needed (leg pain).   tiZANidine 4 MG tablet Commonly known as:  ZANAFLEX Take 4 mg by mouth every 6 (six) hours as needed for muscle spasms.   warfarin 4 MG tablet Commonly known as:  COUMADIN Take 2 tablets (8 mg total) by mouth daily at 6 PM. What changed:  medication strength  how much to take  when to take this  additional instructions            Durable Medical Equipment        Start     Ordered   05/08/17 1339  For home use only DME 3 n 1  Once     05/08/17 1338       Discharge Care Instructions        Start     Ordered   05/08/17 0000  warfarin (COUMADIN) 4 MG tablet  Daily-1800     05/08/17 1348   05/08/17 0000  Increase activity slowly     05/08/17 1348   05/08/17 0000  Diet - low sodium heart healthy     05/08/17 1348   05/08/17 0000  Call MD for:  temperature >100.4     05/08/17 1348   05/08/17 0000  Call MD for:  persistant nausea and vomiting     05/08/17 1348   05/08/17 0000  Call MD for:  severe uncontrolled pain     05/08/17 1348   05/08/17 0000  Call MD for:  difficulty breathing, headache or visual disturbances     05/08/17 1348   05/08/17 0000  Call MD for:  persistant dizziness or light-headedness     05/08/17 1348   05/08/17 0000  Call MD for:  extreme fatigue     05/08/17 1348     Follow-up Information     [facility] Follow [facility].   Why:  Registered Nurse, Physical Therapy, Aide.  Contact information: [place] [address] [phone]        Lorene Dy, MD. Schedule an appointment as soon as possible for a visit in 1 week(s).   Specialty:  Internal Medicine Why:  Hospital Follow Up  Contact information: [place], [place] [address] [phone]        Evans Lance, MD. Schedule an appointment as soon as possible for a visit in 2 week(s).   Specialty:  Cardiology Why:  Hospital Follow Up  Contact information: [address] [place] [address] [phone]        Jerene Bears, MD. Schedule an appointment as soon as possible for a visit in 2 day(s).   Specialty:  Gastroenterology Why:  Hospital Follow Up  Contact information: [address] [place] [address] [phone]          No Known Allergies Current Discharge Medication List    CONTINUE these medications which have CHANGED   Details  warfarin (COUMADIN) 4 MG tablet Take 2 tablets (8 mg total) by mouth daily at 6 PM. Qty: 28 tablet, Refills: 0      CONTINUE these medications which have NOT CHANGED   Details  alendronate (FOSAMAX) 70 MG tablet Take 70 mg by mouth once a week. Take with a full glass of water on an empty stomach.    carvedilol (COREG) 3.125 MG tablet TAKE ONE (1) TABLET BY MOUTH TWO (2) TIMES DAILY WITH A MEAL Qty: 60 tablet, Refills: 11    cholecalciferol (VITAMIN D) 1000 UNITS tablet Take 2,000 Units by mouth daily.    digoxin (LANOXIN) 0.125 MG tablet Take 0.125 mg by mouth daily.    furosemide (LASIX) 40 MG tablet TAKE ONE (1) TABLET BY MOUTH EVERY DAY. IF WEIGHT IS GREATER THAN 140LBS ON HOME SCALE, TAKE 2 TABLETS (80MG) Qty: 60 tablet, Refills: 3    meclizine (ANTIVERT)  25 MG tablet Take 25 mg by mouth 2 (two) times daily as needed for dizziness or nausea.    pentoxifylline (TRENTAL) 400 MG  CR tablet Take 400 mg by mouth daily as needed (leg pain).     tiZANidine (ZANAFLEX) 4 MG tablet Take 4 mg by mouth every 6 (six) hours as needed for muscle spasms.      STOP taking these medications     spironolactone (ALDACTONE) 25 MG tablet        Procedures/Studies:  No results found.   Subjective: Pt without complaints, denies rectal bleeding, denies black stools  Discharge Exam: Vitals:   05/08/17 0853 05/08/17 1130  BP: 97/68 (!) 85/58  Pulse: 71 75  Resp: 16 18  Temp:  98.2 F (36.8 C)  SpO2: 95% 100%   Vitals:   05/08/17 0509 05/08/17 0732 05/08/17 0853 05/08/17 1130  BP: 118/63 (!) 87/58 97/68 (!) 85/58  Pulse: 81 76 71 75  Resp: (!) 21 20 16 18   Temp: 98.1 F (36.7 C) 98.3 F (36.8 C)  98.2 F (36.8 C)  TempSrc: Oral Oral  Oral  SpO2: 100% 99% 95% 100%  Weight: 61.2 kg (135 lb)     Height:        General: awake, alert, NAD.   HEENT: NCAT    Cardiovascular: rate controlled, positive metallic click, positive systolic murmur   Respiratory: good air movement, no wheezing, no crackles.   Abdomen: soft, NT, ND, positive BS  Musculoskeletal: no edema, no cyanosis, no crackles   Neuro: nonfocal    The results of significant diagnostics from this hospitalization (including imaging, microbiology, ancillary and laboratory) are listed below for reference.     Microbiology: No results found for this or any previous visit (from the past 240 hour(s)).   Labs: BNP (last 3 results) No results for input(s): BNP in the last 8760 hours. Basic Metabolic Panel:  Recent Labs Lab 05/04/17 0219 05/04/17 1030 05/06/17 0430 05/08/17 0204  NA 138  --  137 137  K 3.8  --  3.6 4.0  CL 109  --  107 105  CO2 23  --  25 23  GLUCOSE 100*  --  87 100*  BUN 10  --  8 8  CREATININE 0.96  --  0.89 0.83  CALCIUM 8.4*  --  8.7* 9.0  MG  --  1.8 1.8  --    Liver Function Tests: No results for input(s): AST, ALT, ALKPHOS, BILITOT, PROT, ALBUMIN in the last 168  hours. No results for input(s): LIPASE, AMYLASE in the last 168 hours. No results for input(s): AMMONIA in the last 168 hours. CBC:  Recent Labs Lab 05/04/17 0219 05/05/17 0137 05/06/17 0430 05/07/17 0310 05/08/17 0204  WBC 3.9* 5.8 4.4 4.7 6.4  HGB 8.3* 8.3* 8.9* 8.7* 9.8*  HCT 25.3* 26.3* 28.7* 27.7* 31.7*  MCV 93.7 94.3 95.0 94.9 94.3  PLT 127* 153 172 191 252   Cardiac Enzymes: No results for input(s): CKTOTAL, CKMB, CKMBINDEX, TROPONINI in the last 168 hours. BNP: Invalid input(s): POCBNP CBG:  Recent Labs Lab 05/07/17 1101 05/07/17 1600 05/07/17 2011 05/08/17 0731 05/08/17 1127  GLUCAP 111* 130* 122* 88 100*   D-Dimer No results for input(s): DDIMER in the last 72 hours. Hgb A1c No results for input(s): HGBA1C in the last 72 hours. Lipid Profile No results for input(s): CHOL, HDL, LDLCALC, TRIG, CHOLHDL, LDLDIRECT in the last 72 hours. Thyroid function studies No results for input(s): TSH, T4TOTAL, T3FREE,  THYROIDAB in the last 72 hours.  Invalid input(s): FREET3 Anemia work up No results for input(s): VITAMINB12, FOLATE, FERRITIN, TIBC, IRON, RETICCTPCT in the last 72 hours. Urinalysis    Component Value Date/Time   COLORURINE YELLOW 10/23/2010 2318   APPEARANCEUR CLEAR 10/23/2010 2318   LABSPEC 1.011 10/23/2010 2318   PHURINE 5.0 10/23/2010 2318   GLUCOSEU NEGATIVE 02/03/2008 1545   HGBUR NEGATIVE 10/23/2010 2318   BILIRUBINUR NEGATIVE 10/23/2010 2318   KETONESUR NEGATIVE 10/23/2010 2318   PROTEINUR NEGATIVE 10/23/2010 2318   UROBILINOGEN 0.2 10/23/2010 2318   NITRITE NEGATIVE 10/23/2010 2318   LEUKOCYTESUR  10/23/2010 2318    NEGATIVE MICROSCOPIC NOT DONE ON URINES WITH NEGATIVE PROTEIN, BLOOD, LEUKOCYTES, NITRITE, OR GLUCOSE <1000 mg/dL.   Sepsis Labs Invalid input(s): PROCALCITONIN,  WBC,  LACTICIDVEN Microbiology No results found for this or any previous visit (from the past 240 hour(s)).  Time coordinating discharge: 38  minutes  SIGNED:   Irwin Brakeman, MD  [facility] 05/08/2017, 1:53 PM Pager [phone]  If 7PM-7AM, please contact night-coverage [email or web address] Password [email or web address]

## 2017-05-08 NOTE — Progress Notes (Signed)
ANTICOAGULATION CONSULT NOTE - FOLLOW UP    HL = 0.7 (goal 0.3 - 0.5 units/mL) Heparin dosing weight = 64 kg   Assessment: 66 YOF with history of mechanical mitral valve, Afib and DVT to continue on IV heparin while INR is sub-therapeutic on Coumadin.  Heparin levels have been variable and supra-therapeutic tonight.  Confirmed with RN that lab was drawn appropriately and no bleeding was observed.   Plan: Reduce heparin gtt to 1050 units/hr Check 8 hr heparin level   Thuy D. Mina Marble, PharmD, BCPS 05/08/2017, 12:26 AM

## 2017-05-08 NOTE — Progress Notes (Signed)
[facility] for heparin and coumadin Indication: mechanical mitral valve  No Known Allergies  Patient Measurements: Height: 5\' 7"  (170.2 cm) Weight: 135 lb (61.2 kg) IBW/kg (Calculated) : 61.6 Heparin Dosing Weight: 63.8 kg  Vital Signs: Temp: 98.3 F (36.8 C) (09/13 0732) Temp Source: Oral (09/13 0732) BP: 87/58 (09/13 0732) Pulse Rate: 76 (09/13 0732)  Labs:  Recent Labs  05/06/17 0430  05/07/17 0310 05/07/17 1358 05/07/17 2248 05/08/17 0204  HGB 8.9*  --  8.7*  --   --  9.8*  HCT 28.7*  --  27.7*  --   --  31.7*  PLT 172  --  191  --   --  252  LABPROT 20.7*  --  24.2*  --   --  26.7*  INR 1.79  --  2.19  --   --  2.49  HEPARINUNFRC 0.25*  < > <0.10* 0.25* 0.70  --   CREATININE 0.89  --   --   --   --  0.83  < > = values in this interval not displayed.  Estimated Creatinine Clearance: 58.3 mL/min (by C-G formula based on SCr of 0.83 mg/dL).  Assessment: 70 yr old female on warfarin prior to admission for hx mechanical mitral valve, afib, and DVT.  Admitted with bloody stools and elevated INR.  Per GI conservative management only. Bridging with heparin while on warfarin and will continue to aim for therapeutic INR 2.5-2.7. INR= 2.49 (trend up)  Home coumadin: 8mg /d except 9mg  on Mon   Goal of Therapy:  Heparin level 0.3-0.5 INR goal 2.5-2.7 Monitor platelets by anticoagulation protocol: Yes   Plan:  -Consider d/c heparin  -Coumadin 5mg  po today (due to consistent INR trend up) -Consider discharge on coumadin 8mg  po daily with close INR follow-up  Hildred Laser, Pharm D 05/08/2017 8:52 AM

## 2017-05-11 ENCOUNTER — Telehealth: Payer: Self-pay | Admitting: Physician Assistant

## 2017-05-11 NOTE — Telephone Encounter (Signed)
Katie with [facility] was calling INR to [place]. Per records it appears INR is followed by primary care. Recent discharge indicated recommendation for PT/INR to be sent to primary care every 2 days. I have asked Katie to check with primary care as they have been actively following this. I advised her to call back if any issues should arise with this. She verbalized understanding/gratitude. Dayna Dunn PA-C

## 2017-05-12 NOTE — Progress Notes (Signed)
ICD remote transmission

## 2017-05-13 ENCOUNTER — Telehealth: Payer: Self-pay | Admitting: Internal Medicine

## 2017-05-13 NOTE — Telephone Encounter (Signed)
New Message   pt Home health verbalized that she is calling for rn   Dr.Robets is not going to sign order to continue home health  Please call if Dr.Taylor will sign off   Pt also have INR done with agency today's reports are: INR 3.5     8 mg of coumadin a day -Rectal beeding  BP 78/50 left arm 80/50 right arm HR 82 in regular  No dizziness and husband said she has been a little slow   Pt has not taken second dose of Coreg 3.125  No lasix today

## 2017-05-14 LAB — CUP PACEART REMOTE DEVICE CHECK
Battery Remaining Longevity: 55 mo
Battery Remaining Percentage: 65 %
Battery Voltage: 2.99 V
Date Time Interrogation Session: 20180914003034
HighPow Impedance: 40 Ohm
HighPow Impedance: 40 Ohm
Implantable Lead Implant Date: 20090618
Implantable Lead Implant Date: 20090618
Implantable Lead Location: 753858
Implantable Lead Location: 753860
Implantable Lead Model: 7120
Implantable Pulse Generator Implant Date: 20160111
Lead Channel Impedance Value: 280 Ohm
Lead Channel Impedance Value: 410 Ohm
Lead Channel Pacing Threshold Amplitude: 0.375 V
Lead Channel Pacing Threshold Amplitude: 0.75 V
Lead Channel Pacing Threshold Pulse Width: 0.5 ms
Lead Channel Pacing Threshold Pulse Width: 0.5 ms
Lead Channel Sensing Intrinsic Amplitude: 12 mV
Lead Channel Setting Pacing Amplitude: 2 V
Lead Channel Setting Pacing Amplitude: 2 V
Lead Channel Setting Pacing Pulse Width: 0.5 ms
Lead Channel Setting Pacing Pulse Width: 0.5 ms
Lead Channel Setting Sensing Sensitivity: 0.5 mV
Pulse Gen Serial Number: 7219979

## 2017-05-14 NOTE — Telephone Encounter (Signed)
Follow up       Would like a response tonight or tomorrow if possible.  Please call pt is due for another visit this week and they do not want to close her out and have to start all over again. If they do not get order saying she can continue visits .

## 2017-05-15 ENCOUNTER — Ambulatory Visit (INDEPENDENT_AMBULATORY_CARE_PROVIDER_SITE_OTHER): Payer: Medicare HMO | Admitting: Nurse Practitioner

## 2017-05-15 ENCOUNTER — Encounter: Payer: Self-pay | Admitting: Nurse Practitioner

## 2017-05-15 VITALS — BP 94/58 | HR 68 | Ht 67.0 in | Wt 140.0 lb

## 2017-05-15 DIAGNOSIS — K625 Hemorrhage of anus and rectum: Secondary | ICD-10-CM

## 2017-05-15 DIAGNOSIS — K5900 Constipation, unspecified: Secondary | ICD-10-CM

## 2017-05-15 DIAGNOSIS — C189 Malignant neoplasm of colon, unspecified: Secondary | ICD-10-CM

## 2017-05-15 NOTE — Patient Instructions (Signed)
If you are age 73 or older, your body mass index should be between 23-30. Your Body mass index is 21.93 kg/m. If this is out of the aforementioned range listed, please consider follow up with your Primary Care Provider.  If you are age 33 or younger, your body mass index should be between 19-25. Your Body mass index is 21.93 kg/m. If this is out of the aformentioned range listed, please consider follow up with your Primary Care Provider.   Take Colace as directed.  If recurrent bleeding, let us know. Will call if Dr. Fuller Plan wants to do another virtual colonoscopy.  Thank you for choosing me and [facility].   Tye Savoy, NP

## 2017-05-16 NOTE — Telephone Encounter (Signed)
Spoke with Amy from [facility].  Per Amy, Pt's PCP Dr. Mancel Bale does not want to sign orders for [facility] care for Pt.  Pt with recent hospitalization with GIB.  Sent home on 8 mg of warfarin (previously taking 6 mg).  Last INR check was 3.5.  No adjustments made.   Pt with low BP taken by Amy with [facility].  Notified Dr. Lovena Le of findings.  Will check in with patient family and continue to monitor.

## 2017-05-20 NOTE — Progress Notes (Signed)
HPI:  Patient is a 71 yo female with a complex medical history not limited to artificial heart valve, nonischemic cardiomyopathy, chronic systolic heart failure, ICD placement, chronic AFIB. She is on chronic coumadin. Patient is known to Dr. Fuller Plan. She has a remote history of colon cancer, T3N1, s/p right hemicolectomy and chemo. She had a follow up colonoscopy in 2010 revealing moderate to severe diverticulosis. A small hyperplastic polyp was removed. We saw her in 2015 to discuss surveillance colonoscopy but given her increased risk for procedures a virtual colonoscopy was felt to be more appropriate. No lesions were seen on the study but the prep was poor.    Angela [name] was hospitalized earlier this month for painless hematochezia in setting of a supratherapeutic INR of 4.3. She had one bloody BM before presenting to ED.  She experienced some mild dizziness but no SOB or chest pain. No associated nausea or vomiting. Her hgb fell to around 8 from a baseline of 12.4. Vitamin K was given to reverse warfarin. She was transfused a unit of blood. No further episodes of bleeding after presenting to the ED. We saw her in consultation, did not recommend colonoscopy since she was high risk candidate and bleedings resolved spontaneously. .     Past Medical History:  Diagnosis Date  . Abnormality of gait 08/23/2014  . Arthritis   . Atrial fibrillation ([place])    A.  Chronic Coumadin  . Cardiac arrest - ventricular fibrillation    A.  12/2007;  B. 01/2008 St. Jude Promote Bi-V ICD placed  . CVA (cerebral vascular accident) ([place])   . Diverticular disease   . DVT (deep venous thrombosis) ([place])   . 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 History of colon cancer    A.  1999 - T3, N1  chemotherapy  . Internal hemorrhoids   . Memory deficit 08/23/2014  . Nonischemic cardiomyopathy ([place])    a.  11/2001 - Cath - NL Cors; b. ECHO 09/22/11: EF  20%, MVR normal, moderate LAE, mild RAE c. ECHO (08/2012): ED 20%, diff HK, LA mod dilated, mild/mod TR, RV mild/mod decreased sys fx  . Rectal bleeding 04/28/2017  . Rheumatic heart disease    A. 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 Sick sinus syndrome ([place])    A.  s/p pacer in 1995.  B.    . Systolic CHF, chronic ([place])    A.  01/2008 Echo - EF 10-20%     Past Surgical History:  Procedure Laterality Date  . ABDOMINAL HYSTERECTOMY    . AV NODE ABLATION N/A 09/23/2011   Procedure: AV NODE ABLATION;  Surgeon: Evans Lance, MD;  [facility] C[facility] LAB;  Service: Cardiovascular;  Laterality: N/A;  . BIV ICD GENERTAOR CHANGE OUT N/A 09/05/2014   Procedure: BIV ICD GENERTAOR CHANGE OUT;  Surgeon: Evans Lance, MD;  Location: [facility] CATH LAB;  Service: Cardiovascular;  Laterality: N/A;  . Biventricular AICD    . COLON SURGERY  1999  . History of echocardiogram  2003, 2007, 2009  . Mitral Valve Replacement, Bjork-Shiley valve  1983   Family History  Problem Relation Age of Onset  . Diabetes Mother 36  . Coronary artery disease Mother        s/p cabg  . Lung cancer Father 11       smoker  . Coronary artery disease Sister  s/p cabg   Social History  Substance Use Topics  . Smoking status: Never Smoker  . Smokeless tobacco: Never Used  . Alcohol use No   Current Outpatient Prescriptions  Medication Sig Dispense Refill  . alendronate (FOSAMAX) 70 MG tablet Take 70 mg by mouth once a week. Take with a full glass of water on an empty stomach.    . carvedilol (COREG) 3.125 MG tablet TAKE ONE (1) TABLET BY MOUTH TWO (2) TIMES DAILY WITH A MEAL 60 tablet 11  . cholecalciferol (VITAMIN D) 1000 UNITS tablet Take 2,000 Units by mouth daily.    . digoxin (LANOXIN) 0.125 MG tablet Take 0.125 mg by mouth daily.    . furosemide (LASIX) 40 MG tablet TAKE ONE (1) TABLET BY MOUTH EVERY DAY. IF WEIGHT IS GREATER THAN 140LBS ON HOME SCALE, TAKE 2 TABLETS (80MG ) (Patient taking differently: Take 20 mg by  mouth daily as needed for fluid. ) 60 tablet 3  . meclizine (ANTIVERT) 25 MG tablet Take 25 mg by mouth 2 (two) times daily as needed for dizziness or nausea.    . pentoxifylline (TRENTAL) 400 MG CR tablet Take 400 mg by mouth daily as needed (leg pain).     [name] tiZANidine (ZANAFLEX) 4 MG tablet Take 4 mg by mouth every 6 (six) hours as needed for muscle spasms.    [name] warfarin (COUMADIN) 4 MG tablet Take 2 tablets (8 mg total) by mouth daily at 6 PM. 28 tablet 0   No current facility-administered medications for this visit.    No Known Allergies   Review of Systems: All systems reviewed and negative except where noted in HPI.    Physical Exam: BP (!) 94/58   Pulse 68   Ht 5\' 7"  (1.702 m)   Wt 140 lb (63.5 kg)   BMI 21.93 kg/m  Constitutional:  Well-developed, female in no acute distress. Psychiatric: Normal mood and affect. Behavior is normal. EENT: Pupils normal.  Conjunctivae are normal. No scleral icterus. Neck supple.  Cardiovascular: Normal rate, regular rhythm. No edema Pulmonary/chest: Effort normal and breath sounds normal. No wheezing, rales or rhonchi. Abdominal: Soft, nondistended. Nontender. Bowel sounds active throughout. There are no masses palpable. No hepatomegaly. Lymphadenopathy: No cervical adenopathy noted. Neurological: Alert and oriented to person place and time. Skin: Skin is warm and dry. No rashes noted.   ASSESSMENT AND PLAN:  51. 71 yo female with a complex medical history recently hospitalized with ONE episode of painless rectal bleeding in setting of supratherapeutic INR on warfarin. Presenting hgb was 12.4  down from a baseline of around 12  [place] hgb 7.9 but this was after IVF.  Seems like drop in hgb was out of proportion to the bleeding. We saw patient in consultation but did not pursue endoscopic workup. We felt patient was at increased risk for procedures and bleeding resolved spontaneously.  -Will discuss with her primary GI, Dr. Fuller Plan but patient  is still at increased risk for procedures. She hasn't had any further episodes of bleeding and hgb continues to improve, up to 9.8 on 05/08/17. See #2..  2. Personal remote history of colon cancer, s/p right hemicolectomy and chemo. She was due for surveillance colonoscopy in 2015 but after discussion with patient and husband it was agreed that virtual colonoscopy was more appropriate given multiple medical problems.  Virtual colonoscopy didn't detect any polyps / cancers but such lesions could have been obscured by the moderate amount of stool  in colon.  -Will discuss  with primary GI, Dr. Fuller Plan. He may want to repeat the virtual colonoscopy. I will let patient know our recommendations.   I spent 25 minutes of face-to-face time with the patient. Greater than 50% of the time was spent counseling and coordinating care. Questions answered    Tye Savoy, NP  05/20/2017, 12:02 AM

## 2017-05-20 NOTE — Telephone Encounter (Signed)
I am confused. What am I being asked about in this note. GT

## 2017-05-21 NOTE — Progress Notes (Signed)
Reviewed and agree with initial management plan. Virtual colonoscopy or ACBE is reasonable (each would require an extended bowel prep) if the patient wants further evaluation. Findings might require further evaluation with colonoscopy.   Pricilla Riffle. Fuller Plan, MD [facility]

## 2017-05-22 NOTE — Progress Notes (Signed)
Beth, please let patient know that I spoke with Dr Fuller Plan. You can read his comments at bottle of my note. We have no way of knowing why she recently bled. Please offer her the virtual colonoscopy or ACBE if she wants further explanation or to at least rule out any large polyps or other major abnormalities which could have caused the bleeding. Thanks

## 2017-05-27 NOTE — Telephone Encounter (Signed)
Pt with follow up appt with APP 05/28/2017.  No further action at this time.

## 2017-05-27 NOTE — Progress Notes (Signed)
Cardiology Office Note Date:  05/28/2017  Patient ID:  Angela [name], Angela [name] 29-Apr-1946, MRN [id number] PCP:  Lorene Dy, MD  Electrophysiologist:  Dr. Lovena Le   Chief Complaint:   History of Present Illness: Angela [name] is a 71 y.o. female with history of NICM, LBBB, chronic CHF (systolic), VF arrest w/ICD, CVA, DVT, embolic event to iliac artery (s/p embolectomy), presumed chronic AFib, rheumatic VHD w/mechanical MVR.  She comes today to be seen for Dr. Lovena Le,  Last seen by him in June, at that time mentioned nonsustained PMVT known though no treated events.  More recently had LGIB in environment of INR 4.3, required transfusion and warfarin reversal.  She denies any kind of CP, infrequently feels palpitations.  She is accompanied by her husband, no reports of rest SOB, minimal DOE, no symptoms of orthopnea, PND, sleeping with 1 pillow comfortably.  No dizziness, near syncope or syncope.  She is back on her warfarin, monitored and managed with her PMD.  No further bleeding has been noted.  Device information: SJM dual chamber ICD, implanted 2009, gen change 09/05/14, Dr. Lovena Le   Past Medical History:  Diagnosis Date  . Abnormality of gait 08/23/2014  . Arthritis   . Atrial fibrillation ([place])    A.  Chronic Coumadin  . Cardiac arrest - ventricular fibrillation    A.  12/2007;  B. 01/2008 St. Jude Promote Bi-V ICD placed  . CVA (cerebral vascular accident) ([place])   . Diverticular disease   . DVT (deep venous thrombosis) ([place])   . 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 History of colon cancer    A.  1999 - T3, N1  chemotherapy  . Internal hemorrhoids   . Memory deficit 08/23/2014  . Nonischemic cardiomyopathy ([place])    a.  11/2001 - Cath - NL Cors; b. ECHO 09/22/11: EF 20%, MVR normal, moderate LAE, mild RAE c. ECHO (08/2012): ED 20%, diff HK, LA mod dilated, mild/mod TR, RV mild/mod decreased sys fx  . Rectal bleeding  04/28/2017  . Rheumatic heart disease    A. 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 Sick sinus syndrome ([place])    A.  s/p pacer in 1995.  B.    . Systolic CHF, chronic ([place])    A.  01/2008 Echo - EF 10-20%    Past Surgical History:  Procedure Laterality Date  . ABDOMINAL HYSTERECTOMY    . AV NODE ABLATION N/A 09/23/2011   Procedure: AV NODE ABLATION;  Surgeon: Evans Lance, MD;  Location: [facility] C[facility] LAB;  Service: Cardiovascular;  Laterality: N/A;  . BIV ICD GENERTAOR CHANGE OUT N/A 09/05/2014   Procedure: BIV ICD GENERTAOR CHANGE OUT;  Surgeon: Evans Lance, MD;  Location: [facility] CATH LAB;  Service: Cardiovascular;  Laterality: N/A;  . Biventricular AICD    . COLON SURGERY  1999  . History of echocardiogram  2003, 2007, 2009  . Mitral Valve Replacement, Bjork-Shiley valve  1983    Current Outpatient Prescriptions  Medication Sig Dispense Refill  . alendronate (FOSAMAX) 70 MG tablet Take 70 mg by mouth once a week. Take with a full glass of water on an empty stomach.    . carvedilol (COREG) 3.125 MG tablet TAKE ONE (1) TABLET BY MOUTH TWO (2) TIMES DAILY WITH A MEAL 60 tablet 11  . cholecalciferol (VITAMIN D) 1000 UNITS tablet Take 2,000 Units by mouth daily.    . digoxin (  LANOXIN) 0.125 MG tablet Take 0.125 mg by mouth daily.    . furosemide (LASIX) 40 MG tablet TAKE ONE (1) TABLET BY MOUTH EVERY DAY. IF WEIGHT IS GREATER THAN 140LBS ON HOME SCALE, TAKE 2 TABLETS (80MG ) (Patient taking differently: Take 20 mg by mouth daily as needed for fluid. ) 60 tablet 3  . meclizine (ANTIVERT) 25 MG tablet Take 25 mg by mouth 2 (two) times daily as needed for dizziness or nausea.    . pentoxifylline (TRENTAL) 400 MG CR tablet Take 400 mg by mouth daily as needed (leg pain).     [name] tiZANidine (ZANAFLEX) 4 MG tablet Take 4 mg by mouth every 6 (six) hours as needed for muscle spasms.    [name] warfarin (COUMADIN) 4 MG tablet Take 2 tablets (8 mg total) by mouth daily at 6 PM. 28 tablet 0   No current  facility-administered medications for this visit.     Allergies:   Patient has no known allergies.   Social History:  The patient  reports that she has never smoked. She has never used smokeless tobacco. She reports that she does not drink alcohol or use drugs.   Family History:  The patient's family history includes Coronary artery disease in her mother and sister; Diabetes (age of onset: 70) in her mother; Lung cancer (age of onset: 72) in her father.  ROS:  Please see the history of present illness.  All other systems are reviewed and otherwise negative.   PHYSICAL EXAM:  VS:  BP 102/64   Pulse 70   Ht 5\' 7"  (1.702 m)   Wt 143 lb (64.9 kg)   BMI 22.40 kg/m  BMI: Body mass index is 22.4 kg/m. Well nourished, well developed, in no acute distress  HEENT: normocephalic, atraumatic  Neck: no JVD, carotid bruits or masses Cardiac:  RRR (paced) w/extrasystoles appreciated; valve noted, no rubs, or gallops Lungs:  CTA, no wheezing, rhonchi or rales  Abd: soft, nontender MS: no deformity, + age appropriate atrophy Ext: 1+ edema  Skin: warm and dry, no rash Neuro:  No gross deficits appreciated Psych: euthymic mood, full affect  ICD site is stable, no tethering or discomfort   EKG:   Not done today ICD interrogation done today and reviewed by myself:  Battery and lead measurements are good, NSVT only, no treated episodes, 83% BV pacing  09/14/12 TTE Study Conclusions - Left ventricle: The cavity size was moderately dilated. Wall thickness was normal. Indeterminant diastolic function. The estimated ejection fraction was 20%. Diffuse hypokinesis. - Aortic valve: There was no stenosis. - Mitral valve: Mechanical mitral valve. No evidence for significant prosthetic stenosis. Trivial regurgitation. Mean gradient: 59mm Hg (D). - Left atrium: The atrium was moderately dilated. - Right ventricle: The cavity size was normal. Pacer wire or catheter noted in right ventricle.  Systolic function was mildly to moderately reduced. - Right atrium: The atrium was mildly dilated. - Atrial septum: The septum bowed from left to right, consistent with increased left atrial pressure. - Tricuspid valve: Mild-moderate regurgitation. Peak RV-RA gradient:19mm Hg (S). - Pulmonary arteries: PA peak pressure: 78mm Hg (S). - Inferior vena cava: The vessel was normal in size; the respirophasic diameter changes were in the normal range (= 50%); findings are consistent with normal central venous pressure. Impressions - Moderately dilated LV with severe global hypokinesis, EF 20%. Mechanical mitral valve appears to function normally. Normal RV size with mild to moderately decreased systolic function. Mild pulmonary hypertension.  Recent Labs: 04/28/2017:  ALT 15 05/06/2017: Magnesium 1.8 05/08/2017: BUN 8; Creatinine, Ser 0.83; Hemoglobin 9.8; Platelets 252; Potassium 4.0; Sodium 137  No results found for requested labs within last 8760 hours.   Estimated Creatinine Clearance: 58.7 mL/min (by C-G formula based on SCr of 0.83 mg/dL).   Wt Readings from Last 3 Encounters:  05/28/17 143 lb (64.9 kg)  05/15/17 140 lb (63.5 kg)  05/08/17 135 lb (61.2 kg)     Other studies reviewed: Additional studies/records reviewed today include: summarized above  ASSESSMENT AND PLAN:  1. 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 Lovena Le, has failed amiodarone historically, he BP will not allow titration of BB  2. Paroxysmal AFib     CHA2DS2Vasc is at least 5, on warfarin   3. Mechanical MVR     Last echo 2014 with stable function     On warfarin     She has had a number of embolic events, as well as a bleeding event unfortunately  4. NICM     CorVue is at threshold, she has instructions for extra lasix when home AM weight is >140, she has been 139-140 of late     On BB, lasix, BP is limiting  5. HTN     Relative hypotension  of late  Disposition: F/u with Q 3 month remotes, 6 months in-clinic, sooner if needed  Current medicines are reviewed at length with the patient today.  The patient did not have any concerns regarding medicines.  Venetia Night, PA-C 05/28/2017 2:11 PM     [address] [place] [address] [phone] (office)  [phone] (fax)

## 2017-05-28 ENCOUNTER — Ambulatory Visit (INDEPENDENT_AMBULATORY_CARE_PROVIDER_SITE_OTHER): Payer: Medicare HMO | Admitting: Physician Assistant

## 2017-05-28 ENCOUNTER — Encounter (INDEPENDENT_AMBULATORY_CARE_PROVIDER_SITE_OTHER): Payer: Self-pay

## 2017-05-28 VITALS — BP 102/64 | HR 70 | Ht 67.0 in | Wt 143.0 lb

## 2017-05-28 DIAGNOSIS — I4821 Permanent atrial fibrillation: Secondary | ICD-10-CM

## 2017-05-28 DIAGNOSIS — Z95810 Presence of automatic (implantable) cardiac defibrillator: Secondary | ICD-10-CM | POA: Diagnosis not present

## 2017-05-28 DIAGNOSIS — I447 Left bundle-branch block, unspecified: Secondary | ICD-10-CM | POA: Diagnosis not present

## 2017-05-28 DIAGNOSIS — Z952 Presence of prosthetic heart valve: Secondary | ICD-10-CM

## 2017-05-28 DIAGNOSIS — I482 Chronic atrial fibrillation: Secondary | ICD-10-CM | POA: Diagnosis not present

## 2017-05-28 DIAGNOSIS — I4891 Unspecified atrial fibrillation: Secondary | ICD-10-CM

## 2017-05-28 DIAGNOSIS — I472 Ventricular tachycardia, unspecified: Secondary | ICD-10-CM

## 2017-05-28 DIAGNOSIS — I099 Rheumatic heart disease, unspecified: Secondary | ICD-10-CM

## 2017-05-28 DIAGNOSIS — I428 Other cardiomyopathies: Secondary | ICD-10-CM | POA: Diagnosis not present

## 2017-05-28 NOTE — Patient Instructions (Addendum)
Medication Instructions:   Your physician recommends that you continue on your current medications as directed. Please refer to the Current Medication list given to you today.   If you need a refill on your cardiac medications before your next appointment, please call your pharmacy.  Labwork: NONE ORDERED  TODAY    Testing/Procedures: NONE ORDERED  TODAY    Follow-Up:  Your physician wants you to follow-up in:  [place] will receive a reminder letter in the mail two months in advance. If you don't receive a letter, please call our office to schedule the follow-up appointment.    Remote monitoring is used to monitor your Pacemaker of ICD from home. This monitoring reduces the number of office visits required to check your device to one time per year. It allows [place] to keep an eye on the functioning of your device to ensure it is working properly. You are scheduled for a device check from home on . 08-04-17 You may send your transmission at any time that day. If you have a wireless device, the transmission will be sent automatically. After your physician reviews your transmission, you will receive a postcard with your next transmission date.     Any Other Special Instructions Will Be Listed Below (If Applicable).

## 2017-06-02 NOTE — Progress Notes (Signed)
I have spoken with the patient and her spouse. They have discussed the options. The patient does not want to pursue further testing at this time. She declines to schedule the virtual colonoscopy or an ACBE. She denies further bleeding.

## 2017-08-01 DIAGNOSIS — Z515 Encounter for palliative care: Secondary | ICD-10-CM

## 2017-08-04 ENCOUNTER — Ambulatory Visit (INDEPENDENT_AMBULATORY_CARE_PROVIDER_SITE_OTHER): Payer: Medicare HMO | Admitting: *Deleted

## 2017-08-04 DIAGNOSIS — I428 Other cardiomyopathies: Secondary | ICD-10-CM | POA: Diagnosis not present

## 2017-08-04 DIAGNOSIS — I5022 Chronic systolic (congestive) heart failure: Secondary | ICD-10-CM

## 2017-08-04 NOTE — Progress Notes (Signed)
Remote ICD transmission.   

## 2017-08-08 ENCOUNTER — Encounter: Payer: Self-pay | Admitting: Cardiology

## 2017-08-28 LAB — CUP PACEART REMOTE DEVICE CHECK
Battery Remaining Longevity: 56 mo
Battery Remaining Percentage: 63 %
Battery Voltage: 2.98 V
Date Time Interrogation Session: 20181210070016
HighPow Impedance: 45 Ohm
HighPow Impedance: 45 Ohm
Implantable Lead Implant Date: 20090618
Implantable Lead Implant Date: 20090618
Implantable Lead Location: 753858
Implantable Lead Location: 753860
Implantable Lead Model: 7120
Implantable Pulse Generator Implant Date: 20160111
Lead Channel Impedance Value: 350 Ohm
Lead Channel Impedance Value: 480 Ohm
Lead Channel Pacing Threshold Amplitude: 0.375 V
Lead Channel Pacing Threshold Amplitude: 0.75 V
Lead Channel Pacing Threshold Pulse Width: 0.5 ms
Lead Channel Pacing Threshold Pulse Width: 0.5 ms
Lead Channel Sensing Intrinsic Amplitude: 12 mV
Lead Channel Setting Pacing Amplitude: 2 V
Lead Channel Setting Pacing Amplitude: 2 V
Lead Channel Setting Pacing Pulse Width: 0.5 ms
Lead Channel Setting Pacing Pulse Width: 0.5 ms
Lead Channel Setting Sensing Sensitivity: 0.5 mV
Pulse Gen Serial Number: 7219979

## 2017-11-03 ENCOUNTER — Ambulatory Visit (INDEPENDENT_AMBULATORY_CARE_PROVIDER_SITE_OTHER): Payer: Medicare HMO | Admitting: *Deleted

## 2017-11-03 DIAGNOSIS — I428 Other cardiomyopathies: Secondary | ICD-10-CM

## 2017-11-03 DIAGNOSIS — I5022 Chronic systolic (congestive) heart failure: Secondary | ICD-10-CM

## 2017-11-03 NOTE — Progress Notes (Signed)
Remote ICD transmission.   

## 2017-11-05 ENCOUNTER — Encounter: Payer: Self-pay | Admitting: Cardiology

## 2017-11-22 LAB — CUP PACEART REMOTE DEVICE CHECK
Battery Remaining Longevity: 54 mo
Battery Remaining Percentage: 60 %
Battery Voltage: 2.98 V
Date Time Interrogation Session: 20190311070017
HighPow Impedance: 47 Ohm
HighPow Impedance: 47 Ohm
Implantable Lead Implant Date: 20090618
Implantable Lead Implant Date: 20090618
Implantable Lead Location: 753858
Implantable Lead Location: 753860
Implantable Lead Model: 7120
Implantable Pulse Generator Implant Date: 20160111
Lead Channel Impedance Value: 410 Ohm
Lead Channel Impedance Value: 490 Ohm
Lead Channel Pacing Threshold Amplitude: 0.375 V
Lead Channel Pacing Threshold Amplitude: 0.75 V
Lead Channel Pacing Threshold Pulse Width: 0.5 ms
Lead Channel Pacing Threshold Pulse Width: 0.5 ms
Lead Channel Sensing Intrinsic Amplitude: 12 mV
Lead Channel Setting Pacing Amplitude: 2 V
Lead Channel Setting Pacing Amplitude: 2 V
Lead Channel Setting Pacing Pulse Width: 0.5 ms
Lead Channel Setting Pacing Pulse Width: 0.5 ms
Lead Channel Setting Sensing Sensitivity: 0.5 mV
Pulse Gen Serial Number: 7219979

## 2018-02-02 ENCOUNTER — Ambulatory Visit (INDEPENDENT_AMBULATORY_CARE_PROVIDER_SITE_OTHER): Payer: Medicare HMO | Admitting: *Deleted

## 2018-02-02 DIAGNOSIS — I428 Other cardiomyopathies: Secondary | ICD-10-CM | POA: Diagnosis not present

## 2018-02-02 DIAGNOSIS — I5022 Chronic systolic (congestive) heart failure: Secondary | ICD-10-CM

## 2018-02-02 NOTE — Progress Notes (Signed)
Remote ICD transmission.   

## 2018-02-03 ENCOUNTER — Encounter: Payer: Self-pay | Admitting: Cardiology

## 2018-02-17 LAB — CUP PACEART REMOTE DEVICE CHECK
Battery Remaining Longevity: 50 mo
Battery Remaining Percentage: 57 %
Battery Voltage: 2.98 V
Date Time Interrogation Session: 20190610060017
HighPow Impedance: 46 Ohm
HighPow Impedance: 46 Ohm
Implantable Lead Implant Date: 20090618
Implantable Lead Implant Date: 20090618
Implantable Lead Location: 753858
Implantable Lead Location: 753860
Implantable Lead Model: 7120
Implantable Pulse Generator Implant Date: 20160111
Lead Channel Impedance Value: 380 Ohm
Lead Channel Impedance Value: 480 Ohm
Lead Channel Pacing Threshold Amplitude: 0.375 V
Lead Channel Pacing Threshold Amplitude: 0.75 V
Lead Channel Pacing Threshold Pulse Width: 0.5 ms
Lead Channel Pacing Threshold Pulse Width: 0.5 ms
Lead Channel Sensing Intrinsic Amplitude: 12 mV
Lead Channel Setting Pacing Amplitude: 2 V
Lead Channel Setting Pacing Amplitude: 2 V
Lead Channel Setting Pacing Pulse Width: 0.5 ms
Lead Channel Setting Pacing Pulse Width: 0.5 ms
Lead Channel Setting Sensing Sensitivity: 0.5 mV
Pulse Gen Serial Number: 7219979

## 2018-02-23 ENCOUNTER — Encounter: Payer: Self-pay | Admitting: Internal Medicine

## 2018-02-23 ENCOUNTER — Ambulatory Visit: Payer: Medicare HMO | Admitting: Internal Medicine

## 2018-02-23 VITALS — BP 100/62 | HR 69 | Ht 67.0 in | Wt 149.0 lb

## 2018-02-23 DIAGNOSIS — I5022 Chronic systolic (congestive) heart failure: Secondary | ICD-10-CM | POA: Diagnosis not present

## 2018-02-23 DIAGNOSIS — I428 Other cardiomyopathies: Secondary | ICD-10-CM | POA: Diagnosis not present

## 2018-02-23 DIAGNOSIS — Z95810 Presence of automatic (implantable) cardiac defibrillator: Secondary | ICD-10-CM | POA: Diagnosis not present

## 2018-02-23 DIAGNOSIS — I472 Ventricular tachycardia, unspecified: Secondary | ICD-10-CM

## 2018-02-23 DIAGNOSIS — I480 Paroxysmal atrial fibrillation: Secondary | ICD-10-CM | POA: Diagnosis not present

## 2018-02-23 DIAGNOSIS — Z952 Presence of prosthetic heart valve: Secondary | ICD-10-CM

## 2018-02-23 DIAGNOSIS — I4729 Other ventricular tachycardia: Secondary | ICD-10-CM

## 2018-02-23 NOTE — Patient Instructions (Signed)
Medication Instructions:  Your physician recommends that you continue on your current medications as directed. Please refer to the Current Medication list given to you today.  Labwork: None ordered.  Testing/Procedures: None ordered.  Follow-Up: Your physician wants you to follow-up in: one year with Dr. Lovena Le.   You will receive a reminder letter in the mail two months in advance. If you don't receive a letter, please call our office to schedule the follow-up appointment.  Remote monitoring is used to monitor your ICD from home. This monitoring reduces the number of office visits required to check your device to one time per year. It allows [place] to keep an eye on the functioning of your device to ensure it is working properly. You are scheduled for a device check from home on 05/04/2018. You may send your transmission at any time that day. If you have a wireless device, the transmission will be sent automatically. After your physician reviews your transmission, you will receive a postcard with your next transmission date.  Any Other Special Instructions Will Be Listed Below (If Applicable).  If you need a refill on your cardiac medications before your next appointment, please call your pharmacy.

## 2018-02-23 NOTE — Progress Notes (Signed)
HPI [name] returns today for followup. She is a very pleasant 72 year old woman with a non-ischemic cardiomyopathy, chronic systolic heart failure, left bundle branch block, ventricular fibrillation cardiac arrest, status post resuscitation, status post biventricular ICD implantation.  In the interim, she has not been in the hospital. She does have some swelling in her legs and at times misses her lasix. She is fairly sedentary  No Known Allergies   Current Outpatient Medications  Medication Sig Dispense Refill  . CALCIUM PO Take 1 capsule by mouth daily.    . carvedilol (COREG) 3.125 MG tablet TAKE ONE (1) TABLET BY MOUTH TWO (2) TIMES DAILY WITH A MEAL 60 tablet 11  . cholecalciferol (VITAMIN D) 1000 UNITS tablet Take 2,000 Units by mouth daily.    . digoxin (LANOXIN) 0.125 MG tablet Take 0.125 mg by mouth daily.    . furosemide (LASIX) 40 MG tablet TAKE ONE (1) TABLET BY MOUTH EVERY DAY. IF WEIGHT IS GREATER THAN 140LBS ON HOME SCALE, TAKE 2 TABLETS (80MG ) (Patient taking differently: Take 20 mg by mouth daily as needed for fluid. ) 60 tablet 3  . pentoxifylline (TRENTAL) 400 MG CR tablet Take 400 mg by mouth daily as needed (leg pain).     [name] tiZANidine (ZANAFLEX) 4 MG tablet Take 4 mg by mouth every 6 (six) hours as needed for muscle spasms.    [name] warfarin (COUMADIN) 4 MG tablet Take 2 tablets (8 mg total) by mouth daily at 6 PM. 28 tablet 0   No current facility-administered medications for this visit.      Past Medical History:  Diagnosis Date  . Abnormality of gait 08/23/2014  . Arthritis   . Atrial fibrillation ([place])    A.  Chronic Coumadin  . Cardiac arrest - ventricular fibrillation    A.  12/2007;  B. 01/2008 St. Jude Promote Bi-V ICD placed  . CVA (cerebral vascular accident) ([place])   . Diverticular disease   . DVT (deep venous thrombosis) ([place])   . 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 History  of colon cancer    A.  1999 - T3, N1  chemotherapy  . Internal hemorrhoids   . Memory deficit 08/23/2014  . Nonischemic cardiomyopathy ([place])    a.  11/2001 - Cath - NL Cors; b. ECHO 09/22/11: EF 20%, MVR normal, moderate LAE, mild RAE c. ECHO (08/2012): ED 20%, diff HK, LA mod dilated, mild/mod TR, RV mild/mod decreased sys fx  . Rectal bleeding 04/28/2017  . Rheumatic heart disease    A. 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 Sick sinus syndrome ([place])    A.  s/p pacer in 1995.  B.    . Systolic CHF, chronic ([place])    A.  01/2008 Echo - EF 10-20%    ROS:   All systems reviewed and negative except as noted in the HPI.   Past Surgical History:  Procedure Laterality Date  . ABDOMINAL HYSTERECTOMY    . AV NODE ABLATION N/A 09/23/2011   Procedure: AV NODE ABLATION;  Surgeon: Evans Lance, MD;  Location: [facility] CATH LAB;  Service: Cardiovascular;  Laterality: N/A;  . BIV ICD GENERTAOR CHANGE OUT N/A 09/05/2014   Procedure: BIV ICD GENERTAOR CHANGE OUT;  Surgeon: Evans Lance, MD;  Location: [facility] CATH LAB;  Service: Cardiovascular;  Laterality: N/A;  . Biventricular AICD    . COLON SURGERY  1999  . History of echocardiogram  2003, 2007, 2009  . Mitral Valve Replacement, Bjork-Shiley valve  1983     Family History  Problem Relation Age of Onset  . Diabetes Mother 63  . Coronary artery disease Mother        s/p cabg  . Lung cancer Father 71       smoker  . Coronary artery disease Sister        s/p cabg     Social History   Socioeconomic History  . Marital status: Married    Spouse name: Not on file  . Number of children: 1  . Years of education: Not on file  . Highest education level: Not on file  Occupational History  . Occupation: Retired    Fish farm manager: RETIRED  Social Needs  . Financial resource strain: Not on file  . Food insecurity:    Worry: Not on file    Inability: Not on file  . Transportation needs:    Medical: Not on file    Non-medical: Not on file  Tobacco Use  .  Smoking status: Never Smoker  . Smokeless tobacco: Never Used  Substance and Sexual Activity  . Alcohol use: No  . Drug use: No  . Sexual activity: Never  Lifestyle  . Physical activity:    Days per week: Not on file    Minutes per session: Not on file  . Stress: Not on file  Relationships  . Social connections:    Talks on phone: Not on file    Gets together: Not on file    Attends religious service: Not on file    Active member of club or organization: Not on file    Attends meetings of clubs or organizations: Not on file    Relationship status: Not on file  . Intimate partner violence:    Fear of current or ex partner: Not on file    Emotionally abused: Not on file    Physically abused: Not on file    Forced sexual activity: Not on file  Other Topics Concern  . Not on file  Social History Narrative   The patient is married and has 1 child.  Lives in [place] with husband.  She is retired from working at [employer].  She does not smoke or drink     BP 100/62   Pulse 69   Ht 5\' 7"  (1.702 m)   Wt 149 lb (67.6 kg)   SpO2 99%   BMI 23.34 kg/m   Physical Exam:  Well appearing NAD HEENT: Unremarkable Neck:  No JVD, no thyromegally Lymphatics:  No adenopathy Back:  No CVA tenderness Lungs:  Clear HEART:  Regular rate rhythm, no murmurs, no rubs, no clicks Abd:  soft, positive bowel sounds, no organomegally, no rebound, no guarding Ext:  2 plus pulses, no edema, no cyanosis, no clubbing Skin:  No rashes no nodules Neuro:  CN II through XII intact, motor grossly intact  EKG  DEVICE  Normal device function.  See PaceArt for details.   Assess/Plan: 1. ICD - her St. Jude biv ICD is working normally. Will recheck in several months. 2. Atrial fib - her rate is well controlled. No change in meds. 3. Chronic systolic heart failure - her symptoms are well controlled. She will continue her current meds and maintain a low sodium diet.

## 2018-03-03 LAB — CUP PACEART INCLINIC DEVICE CHECK
Battery Remaining Longevity: 50 mo
Brady Statistic RA Percent Paced: 0 %
Brady Statistic RV Percent Paced: 86 %
Date Time Interrogation Session: 20190701155529
HighPow Impedance: 44.9962
Implantable Lead Implant Date: 20090618
Implantable Lead Implant Date: 20090618
Implantable Lead Location: 753858
Implantable Lead Location: 753860
Implantable Lead Model: 7120
Implantable Pulse Generator Implant Date: 20160111
Lead Channel Impedance Value: 375 Ohm
Lead Channel Impedance Value: 475 Ohm
Lead Channel Pacing Threshold Amplitude: 0.375 V
Lead Channel Pacing Threshold Amplitude: 0.75 V
Lead Channel Pacing Threshold Amplitude: 0.75 V
Lead Channel Pacing Threshold Pulse Width: 0.5 ms
Lead Channel Pacing Threshold Pulse Width: 0.5 ms
Lead Channel Pacing Threshold Pulse Width: 0.5 ms
Lead Channel Sensing Intrinsic Amplitude: 9.8 mV
Lead Channel Setting Pacing Amplitude: 2 V
Lead Channel Setting Pacing Amplitude: 2 V
Lead Channel Setting Pacing Pulse Width: 0.5 ms
Lead Channel Setting Pacing Pulse Width: 0.5 ms
Lead Channel Setting Sensing Sensitivity: 0.5 mV
Pulse Gen Serial Number: 7219979

## 2018-03-18 ENCOUNTER — Other Ambulatory Visit (HOSPITAL_COMMUNITY): Payer: Self-pay | Admitting: Internal Medicine

## 2018-05-04 ENCOUNTER — Ambulatory Visit (INDEPENDENT_AMBULATORY_CARE_PROVIDER_SITE_OTHER): Payer: Medicare HMO | Admitting: *Deleted

## 2018-05-04 DIAGNOSIS — I472 Ventricular tachycardia, unspecified: Secondary | ICD-10-CM

## 2018-05-04 NOTE — Progress Notes (Signed)
Remote ICD transmission.   

## 2018-05-26 LAB — CUP PACEART REMOTE DEVICE CHECK
Battery Remaining Longevity: 49 mo
Battery Remaining Percentage: 55 %
Battery Voltage: 2.98 V
Date Time Interrogation Session: 20190909060025
HighPow Impedance: 48 Ohm
HighPow Impedance: 48 Ohm
Implantable Lead Implant Date: 20090618
Implantable Lead Implant Date: 20090618
Implantable Lead Location: 753858
Implantable Lead Location: 753860
Implantable Lead Model: 7120
Implantable Pulse Generator Implant Date: 20160111
Lead Channel Impedance Value: 390 Ohm
Lead Channel Impedance Value: 490 Ohm
Lead Channel Pacing Threshold Amplitude: 0.375 V
Lead Channel Pacing Threshold Amplitude: 0.75 V
Lead Channel Pacing Threshold Pulse Width: 0.5 ms
Lead Channel Pacing Threshold Pulse Width: 0.5 ms
Lead Channel Sensing Intrinsic Amplitude: 12 mV
Lead Channel Setting Pacing Amplitude: 2 V
Lead Channel Setting Pacing Amplitude: 2 V
Lead Channel Setting Pacing Pulse Width: 0.5 ms
Lead Channel Setting Pacing Pulse Width: 0.5 ms
Lead Channel Setting Sensing Sensitivity: 0.5 mV
Pulse Gen Serial Number: 7219979

## 2018-08-03 ENCOUNTER — Ambulatory Visit (INDEPENDENT_AMBULATORY_CARE_PROVIDER_SITE_OTHER): Payer: Medicare HMO

## 2018-08-03 DIAGNOSIS — I4901 Ventricular fibrillation: Secondary | ICD-10-CM

## 2018-08-03 DIAGNOSIS — I428 Other cardiomyopathies: Secondary | ICD-10-CM

## 2018-08-04 NOTE — Progress Notes (Signed)
Remote ICD transmission.   

## 2018-09-19 LAB — CUP PACEART REMOTE DEVICE CHECK
Battery Remaining Longevity: 46 mo
Battery Remaining Percentage: 52 %
Battery Voltage: 2.98 V
Date Time Interrogation Session: 20191209070017
HighPow Impedance: 48 Ohm
HighPow Impedance: 48 Ohm
Implantable Lead Implant Date: 20090618
Implantable Lead Implant Date: 20090618
Implantable Lead Location: 753858
Implantable Lead Location: 753860
Implantable Lead Model: 7120
Implantable Pulse Generator Implant Date: 20160111
Lead Channel Impedance Value: 380 Ohm
Lead Channel Impedance Value: 490 Ohm
Lead Channel Pacing Threshold Amplitude: 0.375 V
Lead Channel Pacing Threshold Amplitude: 0.75 V
Lead Channel Pacing Threshold Pulse Width: 0.5 ms
Lead Channel Pacing Threshold Pulse Width: 0.5 ms
Lead Channel Sensing Intrinsic Amplitude: 12 mV
Lead Channel Setting Pacing Amplitude: 2 V
Lead Channel Setting Pacing Amplitude: 2 V
Lead Channel Setting Pacing Pulse Width: 0.5 ms
Lead Channel Setting Pacing Pulse Width: 0.5 ms
Lead Channel Setting Sensing Sensitivity: 0.5 mV
Pulse Gen Serial Number: 7219979

## 2018-10-26 ENCOUNTER — Encounter (HOSPITAL_COMMUNITY): Payer: Self-pay | Admitting: *Deleted

## 2018-10-26 ENCOUNTER — Emergency Department (HOSPITAL_COMMUNITY): Payer: Medicare HMO

## 2018-10-26 ENCOUNTER — Inpatient Hospital Stay (HOSPITAL_COMMUNITY)
Admission: EM | Admit: 2018-10-26 | Discharge: 2018-10-28 | DRG: 292 | Disposition: A | Discharge status: Home / Self Care | Payer: Medicare HMO | Attending: Internal Medicine | Admitting: Internal Medicine

## 2018-10-26 ENCOUNTER — Other Ambulatory Visit: Payer: Self-pay

## 2018-10-26 DIAGNOSIS — I248 Other forms of acute ischemic heart disease: Secondary | ICD-10-CM | POA: Diagnosis present

## 2018-10-26 DIAGNOSIS — Z7901 Long term (current) use of anticoagulants: Secondary | ICD-10-CM

## 2018-10-26 DIAGNOSIS — Z85038 Personal history of other malignant neoplasm of large intestine: Secondary | ICD-10-CM

## 2018-10-26 DIAGNOSIS — Z8249 Family history of ischemic heart disease and other diseases of the circulatory system: Secondary | ICD-10-CM

## 2018-10-26 DIAGNOSIS — D638 Anemia in other chronic diseases classified elsewhere: Secondary | ICD-10-CM | POA: Diagnosis present

## 2018-10-26 DIAGNOSIS — Z79899 Other long term (current) drug therapy: Secondary | ICD-10-CM

## 2018-10-26 DIAGNOSIS — F0390 Unspecified dementia without behavioral disturbance: Secondary | ICD-10-CM | POA: Diagnosis present

## 2018-10-26 DIAGNOSIS — I5023 Acute on chronic systolic (congestive) heart failure: Secondary | ICD-10-CM | POA: Diagnosis present

## 2018-10-26 DIAGNOSIS — I5043 Acute on chronic combined systolic (congestive) and diastolic (congestive) heart failure: Secondary | ICD-10-CM | POA: Diagnosis not present

## 2018-10-26 DIAGNOSIS — R7989 Other specified abnormal findings of blood chemistry: Secondary | ICD-10-CM

## 2018-10-26 DIAGNOSIS — D649 Anemia, unspecified: Secondary | ICD-10-CM | POA: Diagnosis present

## 2018-10-26 DIAGNOSIS — Z9221 Personal history of antineoplastic chemotherapy: Secondary | ICD-10-CM

## 2018-10-26 DIAGNOSIS — Z8673 Personal history of transient ischemic attack (TIA), and cerebral infarction without residual deficits: Secondary | ICD-10-CM

## 2018-10-26 DIAGNOSIS — R778 Other specified abnormalities of plasma proteins: Secondary | ICD-10-CM | POA: Diagnosis present

## 2018-10-26 DIAGNOSIS — Z952 Presence of prosthetic heart valve: Secondary | ICD-10-CM

## 2018-10-26 DIAGNOSIS — I428 Other cardiomyopathies: Secondary | ICD-10-CM | POA: Diagnosis present

## 2018-10-26 DIAGNOSIS — I495 Sick sinus syndrome: Secondary | ICD-10-CM | POA: Diagnosis present

## 2018-10-26 DIAGNOSIS — I509 Heart failure, unspecified: Secondary | ICD-10-CM

## 2018-10-26 DIAGNOSIS — Z90710 Acquired absence of both cervix and uterus: Secondary | ICD-10-CM

## 2018-10-26 DIAGNOSIS — I4891 Unspecified atrial fibrillation: Secondary | ICD-10-CM | POA: Diagnosis present

## 2018-10-26 DIAGNOSIS — M810 Age-related osteoporosis without current pathological fracture: Secondary | ICD-10-CM | POA: Diagnosis present

## 2018-10-26 DIAGNOSIS — I272 Pulmonary hypertension, unspecified: Secondary | ICD-10-CM | POA: Diagnosis present

## 2018-10-26 DIAGNOSIS — I099 Rheumatic heart disease, unspecified: Secondary | ICD-10-CM | POA: Diagnosis present

## 2018-10-26 DIAGNOSIS — Z95810 Presence of automatic (implantable) cardiac defibrillator: Secondary | ICD-10-CM | POA: Diagnosis present

## 2018-10-26 DIAGNOSIS — R0602 Shortness of breath: Secondary | ICD-10-CM | POA: Diagnosis not present

## 2018-10-26 DIAGNOSIS — Z86718 Personal history of other venous thrombosis and embolism: Secondary | ICD-10-CM

## 2018-10-26 DIAGNOSIS — Z833 Family history of diabetes mellitus: Secondary | ICD-10-CM

## 2018-10-26 DIAGNOSIS — E876 Hypokalemia: Secondary | ICD-10-CM | POA: Diagnosis present

## 2018-10-26 DIAGNOSIS — I482 Chronic atrial fibrillation, unspecified: Secondary | ICD-10-CM | POA: Diagnosis present

## 2018-10-26 LAB — INFLUENZA PANEL BY PCR (TYPE A & B)
Influenza A By PCR: NEGATIVE
Influenza B By PCR: NEGATIVE

## 2018-10-26 LAB — CBC
HCT: 46.1 % — ABNORMAL HIGH (ref 36.0–46.0)
Hemoglobin: 14.2 g/dL (ref 12.0–15.0)
MCH: 29.2 pg (ref 26.0–34.0)
MCHC: 30.8 g/dL (ref 30.0–36.0)
MCV: 94.9 fL (ref 80.0–100.0)
Platelets: 129 10*3/uL — ABNORMAL LOW (ref 150–400)
RBC: 4.86 MIL/uL (ref 3.87–5.11)
RDW: 15 % (ref 11.5–15.5)
WBC: 3.6 10*3/uL — ABNORMAL LOW (ref 4.0–10.5)
nRBC: 0 % (ref 0.0–0.2)

## 2018-10-26 LAB — BASIC METABOLIC PANEL
Anion gap: 12 (ref 5–15)
BUN: 11 mg/dL (ref 8–23)
CO2: 18 mmol/L — ABNORMAL LOW (ref 22–32)
Calcium: 9.3 mg/dL (ref 8.9–10.3)
Chloride: 110 mmol/L (ref 98–111)
Creatinine, Ser: 0.97 mg/dL (ref 0.44–1.00)
GFR calc Af Amer: >60 mL/min (ref >60)
GFR calc non Af Amer: 58 mL/min — ABNORMAL LOW (ref >60)
Glucose, Bld: 109 mg/dL — ABNORMAL HIGH (ref 70–99)
Potassium: 4.2 mmol/L (ref 3.5–5.1)
Sodium: 140 mmol/L (ref 135–145)

## 2018-10-26 LAB — BRAIN NATRIURETIC PEPTIDE: B Natriuretic Peptide: 1092.7 pg/mL — ABNORMAL HIGH (ref 0.0–100.0)

## 2018-10-26 LAB — I-STAT TROPONIN, ED: Troponin i, poc: 0.11 ng/mL (ref 0.00–0.08)

## 2018-10-26 LAB — TROPONIN I: Troponin I: 0.19 ng/mL (ref <0.03)

## 2018-10-26 LAB — DIGOXIN LEVEL: Digoxin Level: 0.6 ng/mL — ABNORMAL LOW (ref 0.8–2.0)

## 2018-10-26 MED ORDER — DIGOXIN 125 MCG PO TABS
0.1250 mg | ORAL_TABLET | Freq: Every day | ORAL | Status: DC
  Administered 2018-10-27 – 2018-10-28 (×2): 0.125 mg via ORAL
  Filled 2018-10-26 (×2): qty 1

## 2018-10-26 MED ORDER — TIZANIDINE HCL 4 MG PO TABS
4.0000 mg | ORAL_TABLET | Freq: Two times a day (BID) | ORAL | Status: DC
  Administered 2018-10-27 – 2018-10-28 (×4): 4 mg via ORAL
  Filled 2018-10-26 (×4): qty 1

## 2018-10-26 MED ORDER — FUROSEMIDE 10 MG/ML IJ SOLN
40.0000 mg | Freq: Once | INTRAMUSCULAR | Status: AC
  Administered 2018-10-26: 40 mg via INTRAVENOUS
  Filled 2018-10-26: qty 4

## 2018-10-26 MED ORDER — LISINOPRIL 5 MG PO TABS
2.5000 mg | ORAL_TABLET | Freq: Every day | ORAL | Status: DC
  Administered 2018-10-27 – 2018-10-28 (×2): 2.5 mg via ORAL
  Filled 2018-10-26 (×2): qty 1

## 2018-10-26 MED ORDER — ACETAMINOPHEN 650 MG RE SUPP: 650.0000 mg | Freq: Four times a day (QID) | RECTAL | Status: DC | PRN

## 2018-10-26 MED ORDER — CARVEDILOL 3.125 MG PO TABS: 3.1250 mg | ORAL_TABLET | Freq: Two times a day (BID) | ORAL | Status: DC

## 2018-10-26 MED ORDER — SODIUM CHLORIDE 0.9% FLUSH: 3.0000 mL | Freq: Once | INTRAVENOUS | Status: DC

## 2018-10-26 MED ORDER — ACETAMINOPHEN 325 MG PO TABS: 650.0000 mg | ORAL_TABLET | Freq: Four times a day (QID) | ORAL | Status: DC | PRN

## 2018-10-26 MED ORDER — ONDANSETRON HCL 4 MG/2ML IJ SOLN
4.0000 mg | Freq: Once | INTRAMUSCULAR | Status: AC
  Administered 2018-10-26: 4 mg via INTRAVENOUS
  Filled 2018-10-26: qty 2

## 2018-10-26 NOTE — Consult Note (Signed)
Cardiology Consultation Note    Patient ID: Angela [name], MRN: [id number], DOB/AGE: [birth year] 73 y.o. Admit date: 10/26/2018   Date of Consult: 10/26/2018 Primary Physician: Lorene Dy, MD Primary Cardiologist: Dr. Lovena Le  Reason for Consultation:  CHF, Elevated troponin Requesting MD: Dr. Melina Copa  HPI: Angela [name] is a 73 y.o. 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  Due to underlying dementia, the patient is not able to clearly explain her history, although she is able to answer simple questions.  Her husband is at the bedside, and relays that she has been increasingly short of breath over the last several days, with notable orthopnea.  He notes that she always has swelling in her legs although it has not been much worse than baseline.  The patient has complained of intermittent chest discomfort to her husband, but denies it at present.  They deny fevers, chills and cough.  Given her progressive SOB, she was brought to the ED for evaluation.  She was hypertensive upon presentation, but the remainder of her VS were WNL.  Her ECG showed a V-paced rhythm with intermittent PVCs.  Her labs showed a Cr of 0.97, a normal CBC, and troponin of 0.19.  Her CXR showed pulmonary edema.  She was given 40 mg IV Lasix and was admitted to the hospitalist service for further management.  Past Medical History:  Diagnosis Date  . Abnormality of gait 08/23/2014  . Arthritis   . Atrial fibrillation ([place])    A.  Chronic Coumadin  . Cardiac arrest - ventricular fibrillation    A.  12/2007;  B. 01/2008 St. Jude Promote Bi-V ICD placed  . CVA (cerebral vascular accident) ([place])   . Diverticular disease   . DVT (deep venous thrombosis) ([place])   . 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 History of colon cancer    A.  1999 - T3, N1  chemotherapy  . Internal  hemorrhoids   . Memory deficit 08/23/2014  . Nonischemic cardiomyopathy ([place])    a.  11/2001 - Cath - NL Cors; b. ECHO 09/22/11: EF 20%, MVR normal, moderate LAE, mild RAE c. ECHO (08/2012): ED 20%, diff HK, LA mod dilated, mild/mod TR, RV mild/mod decreased sys fx  . Rectal bleeding 04/28/2017  . Rheumatic heart disease    A. 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 Sick sinus syndrome ([place])    A.  s/p pacer in 1995.  B.    . Systolic CHF, chronic ([place])    A.  01/2008 Echo - EF 10-20%      Surgical History:  Past Surgical History:  Procedure Laterality Date  . ABDOMINAL HYSTERECTOMY    . AV NODE ABLATION N/A 09/23/2011   Procedure: AV NODE ABLATION;  Surgeon: Evans Lance, MD;  Location: [facility] CATH LAB;  Service: Cardiovascular;  Laterality: N/A;  . BIV ICD GENERTAOR CHANGE OUT N/A 09/05/2014   Procedure: BIV ICD GENERTAOR CHANGE OUT;  Surgeon: Evans Lance, MD;  Location[facility] CATH LAB;  Service: Cardiovascular;  Laterality: N/A;  . Biventricular AICD    . COLON SURGERY  1999  . History of echocardiogram  2003, 2007, 2009  . Mitral Valve Replacement, Bjork-Shiley valve  1983     Home Meds: Prior to Admission medications   Medication Sig Start Date End Date Taking? Authorizing  Provider  alendronate (FOSAMAX) 70 MG tablet Take 70 mg by mouth once a week. Take with a full glass of water on an empty stomach.   Yes [provider]  carvedilol (COREG) 3.125 MG tablet Take 1 tablet (3.125 mg total) by mouth 2 (two) times daily with a meal. 03/18/18  Yes Evans Lance, MD  cholecalciferol (VITAMIN D) 1000 UNITS tablet Take 2,000 Units by mouth daily.   Yes [provider]  digoxin (LANOXIN) 0.125 MG tablet Take 0.125 mg by mouth daily.   Yes [provider]  furosemide (LASIX) 40 MG tablet TAKE ONE (1) TABLET BY MOUTH EVERY DAY. IF WEIGHT IS GREATER THAN 140LBS ON HOME SCALE, TAKE 2 TABLETS (80MG ) Patient taking differently: Take 20 mg by mouth daily as needed for fluid.   02/03/17  Yes Evans Lance, MD  tiZANidine (ZANAFLEX) 4 MG tablet Take 4 mg by mouth 2 (two) times daily.    Yes [provider]  warfarin (COUMADIN) 4 MG tablet Take 2 tablets (8 mg total) by mouth daily at 6 PM. Patient taking differently: Take 6 mg by mouth daily at 6 PM.  05/08/17  Yes Murlean Iba, MD    Inpatient Medications:  . [START ON 10/27/2018] lisinopril  2.5 mg Oral Daily  . sodium chloride flush  3 mL Intravenous Once     Allergies: No Known Allergies  Social History   Socioeconomic History  . Marital status: Married    Spouse name: Not on file  . Number of children: 1  . Years of education: Not on file  . Highest education level: Not on file  Occupational History  . Occupation: Retired    Fish farm manager: RETIRED  Social Needs  . Financial resource strain: Not on file  . Food insecurity:    Worry: Not on file    Inability: Not on file  . Transportation needs:    Medical: Not on file    Non-medical: Not on file  Tobacco Use  . Smoking status: Never Smoker  . Smokeless tobacco: Never Used  Substance and Sexual Activity  . Alcohol use: No  . Drug use: No  . Sexual activity: Never  Lifestyle  . Physical activity:    Days per week: Not on file    Minutes per session: Not on file  . Stress: Not on file  Relationships  . Social connections:    Talks on phone: Not on file    Gets together: Not on file    Attends religious service: Not on file    Active member of club or organization: Not on file    Attends meetings of clubs or organizations: Not on file    Relationship status: Not on file  . Intimate partner violence:    Fear of current or ex partner: Not on file    Emotionally abused: Not on file    Physically abused: Not on file    Forced sexual activity: Not on file  Other Topics Concern  . Not on file  Social History Narrative   The patient is married and has 1 child.  Lives in [place] with husband.  She is retired from working at [employer].  She does not smoke or drink     Family History  Problem Relation Age of Onset  . Diabetes Mother 33  . Coronary artery disease Mother        s/p cabg  . Lung cancer Father 36  smoker  . Coronary artery disease Sister        s/p cabg     Review of Systems: All other systems reviewed and are otherwise negative except as noted above.  Labs: Recent Labs    10/26/18 1855  TROPONINI 0.19*   Lab Results  Component Value Date   WBC 3.6 (L) 10/26/2018   HGB 14.2 10/26/2018   HCT 46.1 (H) 10/26/2018   MCV 94.9 10/26/2018   PLT 129 (L) 10/26/2018    Recent Labs  Lab 10/26/18 1855  NA 140  K 4.2  CL 110  CO2 18*  BUN 11  CREATININE 0.97  CALCIUM 9.3  GLUCOSE 109*   No results found for: CHOL, HDL, LDLCALC, TRIG No results found for: DDIMER  Radiology/Studies:  Dg Chest 2 View  Result Date: 10/26/2018 CLINICAL DATA:  Chest pain and shortness of breath. EXAM: CHEST - 2 VIEW COMPARISON:  Radiograph 07/07/2012 FINDINGS: Post median sternotomy. Right and left-sided pacemakers remain in place. Prosthetic mitral valve. Cardiomegaly is unchanged from prior exam. Aortic atherosclerosis. There are small bilateral pleural effusions with associated bibasilar opacities. Vascular congestion without overt pulmonary edema. No pneumothorax. Grossly stable osseous structures. IMPRESSION: Findings suggesting fluid overload with vascular congestion, bilateral pleural effusions and bibasilar atelectasis/airspace opacities. Cardiomegaly which is similar to prior exam. Electronically Signed   By: Keith Rake M.D.   On: 10/26/2018 20:17    Wt Readings from Last 3 Encounters:  10/26/18 65.4 kg  02/23/18 67.6 kg  05/28/17 64.9 kg    EKG: V paced with PVCs.  Physical Exam: Blood pressure 119/86, pulse 81, temperature 98.2 F (36.8 C), temperature source Oral, resp. rate (!) 24, height 5\' 6"  (1.676 m), weight 65.4 kg, SpO2 100 %. Body mass index is 23.26  kg/m. General: Well developed, well nourished, in no acute distress. Head: Normocephalic, atraumatic, sclera non-icteric, no xanthomas, nares are without discharge.  Neck: Negative for carotid bruits. JVP up to angle of mandible Lungs: Occasional basilar crackles. Breathing is unlabored. Heart: RRR with S1, mechanical S2. No murmurs, rubs, or gallops appreciated. Abdomen: Soft, non-tender, non-distended with normoactive bowel sounds. No hepatomegaly. No rebound/guarding. No obvious abdominal masses. Msk:  Strength and tone appear normal for age. Extremities: No clubbing or cyanosis. 2+ LE edema b/l.  Distal pedal pulses are 2+ and equal bilaterally. Neuro: Alert and oriented X 1. Psych:  Responds to simple questions appropriately     Assessment and Plan  73 y.o. 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  She is significantly volume overloaded on exam.  1.  Acute on chronic systolic heart failure:  Clearly decompensated on exam and by history.  Continue aggressive IV diuresis; would escalate dose to 80 mg IV Lasix BID if she does not diurese well with 40 mg dose.  Would hold beta blocker, continue home digoxin, with dosing per pharmacy.  She has BP room, so reasonable to add low dose ACE inhibitor.  Would plan to interrogate PPM tomorrow to see if arrhythmia burden is contributing to currrent decompensation.  Reasonable to repeat an echo since it has been quite a while since her last study.  Lower suspicion for an acute valvular issue, but it is on DDx.  2.  Mechanical MVR:  Waiting on admission INR.  Would be reasonable to leave on coumadin, given very low likelihood of invasive procedures.  3.  Elevated troponin:  Likely type  2 event in the context of heart failure exacerbation.  Signed, Doylene Canning, MD 10/26/2018, 11:25 PM

## 2018-10-26 NOTE — ED Provider Notes (Signed)
[facility] EMERGENCY DEPARTMENT Provider Note   CSN: [id number] Arrival date & time: 10/26/18  1749    History   Chief Complaint Chief Complaint  Patient presents with  . Shortness of Breath  . Chest Pain    HPI Angela [name] is a 73 y.o. female.  Level 5 caveat secondary to dementia.  She is brought in by her significant other for evaluation of nausea and shortness of breath that has been going on a few days and getting worse.  Shortness of breath is worse when lying flat.  Reportedly she had chest pain but when I asked her she says not much.  She is unable to give a better history than that.  Her husband says she does not complain.  They have not noticed any fever and she has not had much of a cough.  No vomiting.     The history is provided by the patient and the spouse.  Shortness of Breath  Severity:  Moderate Onset quality:  Gradual Timing:  Constant Progression:  Worsening Chronicity:  Recurrent Context: activity   Relieved by:  None tried Exacerbated by: laying flat. Ineffective treatments:  None tried Associated symptoms: chest pain and cough   Associated symptoms: no abdominal pain, no fever, no hemoptysis, no sputum production, no syncope, no vomiting and no wheezing   Chest Pain  Associated symptoms: cough and shortness of breath   Associated symptoms: no abdominal pain, no fever, no syncope and no vomiting     Past Medical History:  Diagnosis Date  . Abnormality of gait 08/23/2014  . Arthritis   . Atrial fibrillation ([place])    A.  Chronic Coumadin  . Cardiac arrest - ventricular fibrillation    A.  12/2007;  B. 01/2008 St. Jude Promote Bi-V ICD placed  . CVA (cerebral vascular accident) ([place])   . Diverticular disease   . DVT (deep venous thrombosis) ([place])   . 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 History of colon cancer    A.  1999 - T3, N1  chemotherapy  . Internal  hemorrhoids   . Memory deficit 08/23/2014  . Nonischemic cardiomyopathy ([place])    a.  11/2001 - Cath - NL Cors; b. ECHO 09/22/11: EF 20%, MVR normal, moderate LAE, mild RAE c. ECHO (08/2012): ED 20%, diff HK, LA mod dilated, mild/mod TR, RV mild/mod decreased sys fx  . Rectal bleeding 04/28/2017  . Rheumatic heart disease    A. 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 Sick sinus syndrome ([place])    A.  s/p pacer in 1995.  B.    . Systolic CHF, chronic ([place])    A.  01/2008 Echo - EF 10-20%    Patient Active Problem List   Diagnosis Date Noted  . Palliative care by specialist   . Rectal bleeding 04/28/2017  . Acute blood loss anemia 04/28/2017  . ICD (implantable cardioverter-defibrillator), biventricular, in situ 08/24/2014  . Memory deficit 08/23/2014  . Abnormality of gait 08/23/2014  . Dementia ([place]) 12/17/2013  . 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 Hypotension due to drugs 07/12/2012  . Hypokalemia 07/12/2012  . NSVT (nonsustained ventricular tachycardia) ([place]) 07/12/2012  . Acute on chronic systolic heart failure ([place]) 07/07/2012  . Ventricular fibrillation ([place]) 11/11/2011  . Warfarin anticoagulation 09/22/2011  . Nonischemic cardiomyopathy ([place])   . Atrial fibrillation ([place])   . History of colon  cancer   . Internal hemorrhoids   . Rheumatic heart disease   . Sick sinus syndrome ([place])   . Systolic CHF, chronic ([place])   . DIVERTICULAR DISEASE 06/05/2009    Past Surgical History:  Procedure Laterality Date  . ABDOMINAL HYSTERECTOMY    . AV NODE ABLATION N/A 09/23/2011   Procedure: AV NODE ABLATION;  Surgeon: Evans Lance, MD;  Location: [facility] CATH LAB;  Service: Cardiovascular;  Laterality: N/A;  . BIV ICD GENERTAOR CHANGE OUT N/A 09/05/2014   Procedure: BIV ICD GENERTAOR CHANGE OUT;  Surgeon: Evans Lance, MD;  Location: [facility] CATH LAB;  Service: Cardiovascular;  Laterality: N/A;  . Biventricular AICD    . COLON SURGERY  1999  . History of echocardiogram  2003,  2007, 2009  . Mitral Valve Replacement, Bjork-Shiley valve  1983     OB History   No obstetric history on file.      Home Medications    Prior to Admission medications   Medication Sig Start Date End Date Taking? Authorizing Provider  CALCIUM PO Take 1 capsule by mouth daily.    [provider]  carvedilol (COREG) 3.125 MG tablet Take 1 tablet (3.125 mg total) by mouth 2 (two) times daily with a meal. 03/18/18   Evans Lance, MD  cholecalciferol (VITAMIN D) 1000 UNITS tablet Take 2,000 Units by mouth daily.    [provider]  digoxin (LANOXIN) 0.125 MG tablet Take 0.125 mg by mouth daily.    [provider]  furosemide (LASIX) 40 MG tablet TAKE ONE (1) TABLET BY MOUTH EVERY DAY. IF WEIGHT IS GREATER THAN 140LBS ON HOME SCALE, TAKE 2 TABLETS (80MG ) Patient taking differently: Take 20 mg by mouth daily as needed for fluid.  02/03/17   Evans Lance, MD  pentoxifylline (TRENTAL) 400 MG CR tablet Take 400 mg by mouth daily as needed (leg pain).     [provider]  tiZANidine (ZANAFLEX) 4 MG tablet Take 4 mg by mouth every 6 (six) hours as needed for muscle spasms.    [provider]  warfarin (COUMADIN) 4 MG tablet Take 2 tablets (8 mg total) by mouth daily at 6 PM. 05/08/17   Murlean Iba, MD    Family History Family History  Problem Relation Age of Onset  . Diabetes Mother 34  . Coronary artery disease Mother        s/p cabg  . Lung cancer Father 91       smoker  . Coronary artery disease Sister        s/p cabg    Social History Social History   Tobacco Use  . Smoking status: Never Smoker  . Smokeless tobacco: Never Used  Substance Use Topics  . Alcohol use: No  . Drug use: No     Allergies   Patient has no known allergies.   Review of Systems Review of Systems  Unable to perform ROS: Dementia  Constitutional: Negative for fever.  Respiratory: Positive for cough and shortness of breath. Negative for  hemoptysis, sputum production and wheezing.   Cardiovascular: Positive for chest pain. Negative for syncope.  Gastrointestinal: Negative for abdominal pain and vomiting.     Physical Exam Updated Vital Signs BP (!) 159/109 (BP Location: Right Arm)   Pulse 64   Temp 98.2 F (36.8 C) (Oral)   Resp 18   SpO2 100%   Physical Exam Vitals signs and nursing note reviewed.  Constitutional:  General: She is not in acute distress.    Appearance: She is well-developed.  HENT:     Head: Normocephalic and atraumatic.  Eyes:     Conjunctiva/sclera: Conjunctivae normal.  Neck:     Musculoskeletal: Neck supple.  Cardiovascular:     Rate and Rhythm: Normal rate and regular rhythm.     Heart sounds: No murmur.  Pulmonary:     Effort: Pulmonary effort is normal. No respiratory distress.     Breath sounds: Normal breath sounds.  Abdominal:     Palpations: Abdomen is soft.     Tenderness: There is no abdominal tenderness.  Musculoskeletal: Normal range of motion.     Right lower leg: She exhibits no tenderness. Edema present.     Left lower leg: She exhibits no tenderness. Edema present.  Skin:    General: Skin is warm and dry.     Capillary Refill: Capillary refill takes less than 2 seconds.  Neurological:     General: No focal deficit present.     Mental Status: She is alert. She is disoriented.      ED Treatments / Results  Labs (all labs ordered are listed, but only abnormal results are displayed) Labs Reviewed  BASIC METABOLIC PANEL - Abnormal; Notable for the following components:      Result Value   CO2 18 (*)    Glucose, Bld 109 (*)    GFR calc non Af Amer 58 (*)    All other components within normal limits  CBC - Abnormal; Notable for the following components:   WBC 3.6 (*)    HCT 46.1 (*)    Platelets 129 (*)    All other components within normal limits  BRAIN NATRIURETIC PEPTIDE - Abnormal; Notable for the following components:   B Natriuretic Peptide 1,092.7  (*)    All other components within normal limits  TROPONIN I - Abnormal; Notable for the following components:   Troponin I 0.19 (*)    All other components within normal limits  DIGOXIN LEVEL - Abnormal; Notable for the following components:   Digoxin Level 0.6 (*)    All other components within normal limits  TROPONIN I - Abnormal; Notable for the following components:   Troponin I 0.16 (*)    All other components within normal limits  TROPONIN I - Abnormal; Notable for the following components:   Troponin I 0.19 (*)    All other components within normal limits  PROTIME-INR - Abnormal; Notable for the following components:   Prothrombin Time 28.3 (*)    INR 2.7 (*)    All other components within normal limits  BASIC METABOLIC PANEL - Abnormal; Notable for the following components:   Potassium 3.2 (*)    Creatinine, Ser 1.10 (*)    GFR calc non Af Amer 49 (*)    GFR calc Af Amer 57 (*)    All other components within normal limits  TROPONIN I - Abnormal; Notable for the following components:   Troponin I 0.25 (*)    All other components within normal limits  PROTIME-INR - Abnormal; Notable for the following components:   Prothrombin Time 27.4 (*)    INR 2.6 (*)    All other components within normal limits  MAGNESIUM - Abnormal; Notable for the following components:   Magnesium 1.6 (*)    All other components within normal limits  I-STAT TROPONIN, ED - Abnormal; Notable for the following components:   Troponin i, poc 0.11 (*)  All other components within normal limits  INFLUENZA PANEL BY PCR (TYPE A & B)  PROTIME-INR  BASIC METABOLIC PANEL  MAGNESIUM    EKG EKG Interpretation  Date/Time:  Monday October 26 2018 17:57:33 EST Ventricular Rate:  95 PR Interval:    QRS Duration: 170 QT Interval:  430 QTC Calculation: 540 R Axis:   131 Text Interpretation:  Demand pacemaker; interpretation is based on intrinsic rhythm Undetermined rhythm Non-specific intra-ventricular  conduction block Possible Right ventricular hypertrophy Cannot rule out Septal infarct , age undetermined Lateral infarct , age undetermined T wave abnormality, consider inferior ischemia T wave abnormality, consider anterior ischemia Abnormal ECG poor baseline Confirmed by Aletta Edouard [phone]) on 10/26/2018 6:33:11 PM Also confirmed by Aletta Edouard [phone]), editor Philomena Doheny [phone])  on 10/27/2018 7:08:42 AM   Radiology Dg Chest 2 View  Result Date: 10/26/2018 CLINICAL DATA:  Chest pain and shortness of breath. EXAM: CHEST - 2 VIEW COMPARISON:  Radiograph 07/07/2012 FINDINGS: Post median sternotomy. Right and left-sided pacemakers remain in place. Prosthetic mitral valve. Cardiomegaly is unchanged from prior exam. Aortic atherosclerosis. There are small bilateral pleural effusions with associated bibasilar opacities. Vascular congestion without overt pulmonary edema. No pneumothorax. Grossly stable osseous structures. IMPRESSION: Findings suggesting fluid overload with vascular congestion, bilateral pleural effusions and bibasilar atelectasis/airspace opacities. Cardiomegaly which is similar to prior exam. Electronically Signed   By: Keith Rake M.D.   On: 10/26/2018 20:17    Procedures Procedures (including critical care time)  Medications Ordered in ED Medications  sodium chloride flush (NS) 0.9 % injection 3 mL (has no administration in time range)  ondansetron (ZOFRAN) injection 4 mg (has no administration in time range)     Initial Impression / Assessment and Plan / ED Course  I have reviewed the triage vital signs and the nursing notes.  Pertinent labs & imaging results that were available during my care of the patient were reviewed by me and considered in my medical decision making (see chart for details).  Clinical Course as of Oct 27 1735  Mon Oct 26, 6883  [id number] 73 year old female here with increased shortness of breath and inability to lay down flat.  She is got an  elevated troponin and elevated BNP.  Chest x-ray shows some signs of fluid overload.  I put in for a digoxin level on a PT/INR as it was not done in the initial labs.  I placed a call into cardiology for recommendations and have ordered her dose of Lasix IV.   [MB]  2049 Discussed with cardiology who will consult on her.  He recommends that she get a medical admission.   [MB]    Clinical Course User Index [MB] Hayden Rasmussen, MD        Final Clinical Impressions(s) / ED Diagnoses   Final diagnoses:  Acute on chronic congestive heart failure, unspecified heart failure type [facility])    ED Discharge Orders    None       Hayden Rasmussen, MD 10/27/18 [phone]

## 2018-10-26 NOTE — ED Triage Notes (Signed)
Pt reports sob with mid-cp since yesterday.  SOB is worse when laying flat.  Hx of CHF.

## 2018-10-26 NOTE — H&P (Signed)
History and Physical    Angela [name] PPJ:[id number] DOB: [birth year] DOA: 10/26/2018  PCP: Lorene Dy, MD   Patient coming from: Home.  I have personally briefly reviewed patient's old medical records in [place]  Chief Complaint: SOB and chest pain.  HPI: Kandie Keiper is a 73 y.o. female with medical history significant of gait abnormality, osteoarthritis, chronic atrial fibrillation on warfarin, history of cardiac arrest due to V. 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  History is mostly provided by her husband.  ED Course: Initial vital signs temperature 98.2 F, pulse 64, respirations 18, blood pressure 159/109 mmHg and O2 sat 100% on room air.  The patient received supplemental oxygen, furosemide 40 mg IVP x1 and Zofran 4 mg IVP x1.  I-STAT troponin was 0.11 and serum troponin was 0.19 ng/mL.  Her BNP was 1092.7 pg/mL.  Digoxin was 0.6 ng/mL.  White count was 3.6, hemoglobin 14.2 g/dL and platelets 129.  BMP shows a CO2 of 18 mmol/L and a glucose of 109 mg/dL.  All other values are within normal limits.  Influenza a and B by PCR was negative.  EKG shows paced rhythm, T wave abnormalities on inferior and anterior leads.  Please see tracing for further detail.  Her chest radiograph show right and left-sided pacemakers, prosthetic mitral valve, unchanged cardiomegaly, but now showing bilateral pleural effusions and bibasilar atelectasis/airspace opacities.  Review of Systems: As per HPI otherwise 10 point review of systems negative.   Past Medical History:  Diagnosis  Date  . Abnormality of gait 08/23/2014  . Arthritis   . Atrial fibrillation ([place])    A.  Chronic Coumadin  . Cardiac arrest - ventricular fibrillation    A.  12/2007;  B. 01/2008 St. Jude Promote Bi-V ICD placed  . CVA (cerebral vascular accident) ([place])   . Diverticular disease   . DVT (deep venous thrombosis) ([place])   . Embolus and thrombosis of iliac artery ([place])    A.  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 History of colon cancer    A.  1999 - T3, N1  chemotherapy  . Internal hemorrhoids   . Memory deficit 08/23/2014  . Nonischemic cardiomyopathy ([place])    a.  11/2001 - Cath - NL Cors; b. ECHO 09/22/11: EF 20%, MVR normal, moderate LAE, mild RAE c. ECHO (08/2012): ED 20%, diff HK, LA mod dilated, mild/mod TR, RV mild/mod decreased sys fx  . Rectal bleeding 04/28/2017  . Rheumatic heart disease    A. 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 Sick sinus syndrome ([place])    A.  s/p pacer in 1995.  B.    . Systolic CHF, chronic ([place])    A.  01/2008 Echo - EF 10-20%    Past Surgical History:  Procedure Laterality Date  . ABDOMINAL HYSTERECTOMY    . AV NODE ABLATION N/A 09/23/2011   Procedure: AV NODE ABLATION;  Surgeon: Evans Lance, MD;  Location: [facility] CATH LAB;  Service: Cardiovascular;  Laterality: N/A;  . BIV ICD GENERTAOR CHANGE OUT N/A 09/05/2014   Procedure: BIV ICD GENERTAOR CHANGE OUT;  Surgeon: Evans Lance, MD;  Location: [facility] CATH LAB;  Service: Cardiovascular;  Laterality: N/A;  . Biventricular AICD    . COLON SURGERY  1999  . History of echocardiogram  2003, 2007, 2009  . Mitral Valve Replacement, Bjork-Shiley valve  1983     reports that she has never smoked. She has never used smokeless tobacco. She reports that she does not drink alcohol or use drugs.  No Known Allergies  Family History  Problem Relation Age of Onset  . Diabetes Mother 10  . Coronary artery disease Mother        s/p cabg  . Lung cancer Father 52       smoker  . Coronary artery disease Sister          s/p cabg   Prior to Admission medications   Medication Sig Start Date End Date Taking? Authorizing Provider  alendronate (FOSAMAX) 70 MG tablet Take 70 mg by mouth once a week. Take with a full glass of water on an empty stomach.   Yes [provider]  carvedilol (COREG) 3.125 MG tablet Take 1 tablet (3.125 mg total) by mouth 2 (two) times daily with a meal. 03/18/18  Yes Evans Lance, MD  cholecalciferol (VITAMIN D) 1000 UNITS tablet Take 2,000 Units by mouth daily.   Yes [provider]  digoxin (LANOXIN) 0.125 MG tablet Take 0.125 mg by mouth daily.   Yes [provider]  furosemide (LASIX) 40 MG tablet TAKE ONE (1) TABLET BY MOUTH EVERY DAY. IF WEIGHT IS GREATER THAN 140LBS ON HOME SCALE, TAKE 2 TABLETS (80MG ) Patient taking differently: Take 20 mg by mouth daily as needed for fluid.  02/03/17  Yes Evans Lance, MD  tiZANidine (ZANAFLEX) 4 MG tablet Take 4 mg by mouth 2 (two) times daily.    Yes [provider]  warfarin (COUMADIN) 4 MG tablet Take 2 tablets (8 mg total) by mouth daily at 6 PM. Patient taking differently: Take 6 mg by mouth daily at 6 PM.  05/08/17  Yes Murlean Iba, MD    Physical Exam: Vitals:   10/26/18 2130 10/26/18 2215 10/26/18 2230 10/26/18 2257  BP: (!) 148/111 138/75 119/86   Pulse: 74 78 81   Resp: (!) 24     Temp:      TempSrc:      SpO2: 94% 98% 100%   Weight:    65.4 kg  Height:    5\' 6"  (1.676 m)    Constitutional: NAD, calm, comfortable Eyes: PERRL, lids and conjunctivae normal ENMT: Mucous membranes are moist. Posterior pharynx clear of any exudate or lesions. Neck: normal, supple, no masses, no thyromegaly Respiratory: Bibasilar rales, no wheezing, no crackles. Normal respiratory effort. No accessory muscle use.  Cardiovascular: Regular rate and rhythm, no murmurs / rubs / gallops.  2+ lower extremities pitting edema. 2+ pedal pulses. No carotid bruits.  Abdomen: Soft, no tenderness, no masses  palpated. No hepatosplenomegaly. Bowel sounds positive.  Musculoskeletal: no clubbing / cyanosis. Good ROM, no contractures. Normal muscle tone.  Skin: no rashes, lesions, ulcers on very limited dermatological examination. Neurologic: CN 2-12 grossly intact. Sensation intact, DTR normal. Strength 5/5 in all 4.  Psychiatric: Alert and oriented x 1, disoriented to date and situation.. Normal mood.   Labs on Admission: I have personally reviewed following labs and imaging studies  CBC: Recent Labs  Lab 10/26/18 1855  WBC 3.6*  HGB 14.2  HCT 46.1*  MCV 94.9  PLT 124*   Basic Metabolic Panel:  Recent Labs  Lab 10/26/18 1855  NA 140  K 4.2  CL 110  CO2 18*  GLUCOSE 109*  BUN 11  CREATININE 0.97  CALCIUM 9.3   GFR: Estimated Creatinine Clearance: 47.6 mL/min (by C-G formula based on SCr of 0.97 mg/dL). Liver Function Tests: No results for input(s): AST, ALT, ALKPHOS, BILITOT, PROT, ALBUMIN in the last 168 hours. No results for input(s): LIPASE, AMYLASE in the last 168 hours. No results for input(s): AMMONIA in the last 168 hours. Coagulation Profile: No results for input(s): INR, PROTIME in the last 168 hours. Cardiac Enzymes: Recent Labs  Lab 10/26/18 1855  TROPONINI 0.19*   BNP (last 3 results) No results for input(s): PROBNP in the last 8760 hours. HbA1C: No results for input(s): HGBA1C in the last 72 hours. CBG: No results for input(s): GLUCAP in the last 168 hours. Lipid Profile: No results for input(s): CHOL, HDL, LDLCALC, TRIG, CHOLHDL, LDLDIRECT in the last 72 hours. Thyroid Function Tests: No results for input(s): TSH, T4TOTAL, FREET4, T3FREE, THYROIDAB in the last 72 hours. Anemia Panel: No results for input(s): VITAMINB12, FOLATE, FERRITIN, TIBC, IRON, RETICCTPCT in the last 72 hours. Urine analysis:    Component Value Date/Time   COLORURINE YELLOW 10/23/2010 2318   APPEARANCEUR CLEAR 10/23/2010 2318   LABSPEC 1.011 10/23/2010 2318   PHURINE 5.0  10/23/2010 2318   GLUCOSEU NEGATIVE 02/03/2008 1545   HGBUR NEGATIVE 10/23/2010 2318   BILIRUBINUR NEGATIVE 10/23/2010 2318   KETONESUR NEGATIVE 10/23/2010 2318   PROTEINUR NEGATIVE 10/23/2010 2318   UROBILINOGEN 0.2 10/23/2010 2318   NITRITE NEGATIVE 10/23/2010 2318   LEUKOCYTESUR  10/23/2010 2318    NEGATIVE MICROSCOPIC NOT DONE ON URINES WITH NEGATIVE PROTEIN, BLOOD, LEUKOCYTES, NITRITE, OR GLUCOSE <1000 mg/dL.    Radiological Exams on Admission: Dg Chest 2 View  Result Date: 10/26/2018 CLINICAL DATA:  Chest pain and shortness of breath. EXAM: CHEST - 2 VIEW COMPARISON:  Radiograph 07/07/2012 FINDINGS: Post median sternotomy. Right and left-sided pacemakers remain in place. Prosthetic mitral valve. Cardiomegaly is unchanged from prior exam. Aortic atherosclerosis. There are small bilateral pleural effusions with associated bibasilar opacities. Vascular congestion without overt pulmonary edema. No pneumothorax. Grossly stable osseous structures. IMPRESSION: Findings suggesting fluid overload with vascular congestion, bilateral pleural effusions and bibasilar atelectasis/airspace opacities. Cardiomegaly which is similar to prior exam. Electronically Signed   By: Keith Rake M.D.   On: 10/26/2018 20:17   09/14/2012 echocardiogram  ------------------------------------------------------------ LV EF: 20%  ------------------------------------------------------------ History:  PMH: Complete Heart Failure  ------------------------------------------------------------ Study Conclusions  - Left ventricle: The cavity size was moderately dilated. Wall thickness was normal. Indeterminant diastolic function. The estimated ejection fraction was 20%. Diffuse hypokinesis. - Aortic valve: There was no stenosis. - Mitral valve: Mechanical mitral valve. No evidence for significant prosthetic stenosis. Trivial regurgitation. Mean gradient: 23mm Hg (D). - Left atrium: The atrium was  moderately dilated. - Right ventricle: The cavity size was normal. Pacer wire or catheter noted in right ventricle. Systolic function was mildly to moderately reduced. - Right atrium: The atrium was mildly dilated. - Atrial septum: The septum bowed from left to right, consistent with increased left atrial pressure. - Tricuspid valve: Mild-moderate regurgitation. Peak RV-RA gradient:66mm Hg (S). - Pulmonary arteries: PA peak pressure: 61mm Hg (S). - Inferior vena cava: The vessel was normal in size; the respirophasic diameter changes were in the normal range (= 50%); findings are consistent with normal central venous pressure. Impressions:  - Moderately dilated LV with severe global hypokinesis, EF 20%.  Mechanical mitral valve appears to function normally. Normal RV size with mild to moderately decreased systolic function. Mild pulmonary hypertension.  EKG: Independently reviewed.  Vent. rate 95 BPM PR interval * ms QRS duration 170 ms QT/QTc 430/540 ms P-R-T axes * 131 270 Demand pacemaker; interpretation is based on intrinsic rhythm Undetermined rhythm Non-specific intra-ventricular conduction block Possible Right ventricular hypertrophy Cannot rule out Septal infarct , age undetermined Lateral infarct , age undetermined T wave abnormality, consider inferior ischemia T wave abnormality, consider anterior ischemia  Assessment/Plan Principal Problem:   Acute on chronic systolic congestive heart failure (HCC) Observation/telemetry. Continue supplemental oxygen. Trend troponin levels. Continue furosemide 40 mg IVP twice daily. Monitor daily weights, intake and output. Monitor renal function and electrolytes. Replace or optimize electrolytes as needed. Cardiology consult appreciated.  Active Problems:   Atrial fibrillation (HCC) Continue carvedilol 3.125 mg p.o. twice daily Continue digoxin 0.125 mg p.o. daily. Monitor digoxin level. Warfarin per  pharmacy.    Dementia ([place]) Supportive care.    Anemia Monitor hematocrit and hemoglobin.    Elevated troponin And troponin level. Check echocardiogram in the morning.   DVT prophylaxis: On warfarin. Code Status: Full code. Family Communication:  Disposition Plan: Duration for troponin level trending and CHF exhacerbation treatment. Consults called: Cardiology was consulted by EDP. Admission status: Observation/telemetry.   Reubin Milan MD [facility]  10/26/2018, 11:19 PM

## 2018-10-27 ENCOUNTER — Observation Stay (HOSPITAL_COMMUNITY): Payer: Medicare HMO

## 2018-10-27 DIAGNOSIS — Z7901 Long term (current) use of anticoagulants: Secondary | ICD-10-CM | POA: Diagnosis not present

## 2018-10-27 DIAGNOSIS — I5023 Acute on chronic systolic (congestive) heart failure: Secondary | ICD-10-CM | POA: Diagnosis present

## 2018-10-27 DIAGNOSIS — R0602 Shortness of breath: Secondary | ICD-10-CM | POA: Diagnosis present

## 2018-10-27 DIAGNOSIS — I5022 Chronic systolic (congestive) heart failure: Secondary | ICD-10-CM | POA: Diagnosis not present

## 2018-10-27 DIAGNOSIS — Z952 Presence of prosthetic heart valve: Secondary | ICD-10-CM | POA: Diagnosis not present

## 2018-10-27 DIAGNOSIS — D638 Anemia in other chronic diseases classified elsewhere: Secondary | ICD-10-CM | POA: Diagnosis present

## 2018-10-27 DIAGNOSIS — R7989 Other specified abnormal findings of blood chemistry: Secondary | ICD-10-CM | POA: Diagnosis not present

## 2018-10-27 DIAGNOSIS — I482 Chronic atrial fibrillation, unspecified: Secondary | ICD-10-CM | POA: Diagnosis present

## 2018-10-27 DIAGNOSIS — Z85038 Personal history of other malignant neoplasm of large intestine: Secondary | ICD-10-CM | POA: Diagnosis not present

## 2018-10-27 DIAGNOSIS — E876 Hypokalemia: Secondary | ICD-10-CM | POA: Diagnosis present

## 2018-10-27 DIAGNOSIS — I428 Other cardiomyopathies: Secondary | ICD-10-CM | POA: Diagnosis present

## 2018-10-27 DIAGNOSIS — Z90710 Acquired absence of both cervix and uterus: Secondary | ICD-10-CM | POA: Diagnosis not present

## 2018-10-27 DIAGNOSIS — Z833 Family history of diabetes mellitus: Secondary | ICD-10-CM | POA: Diagnosis not present

## 2018-10-27 DIAGNOSIS — Z8673 Personal history of transient ischemic attack (TIA), and cerebral infarction without residual deficits: Secondary | ICD-10-CM | POA: Diagnosis not present

## 2018-10-27 DIAGNOSIS — F0390 Unspecified dementia without behavioral disturbance: Secondary | ICD-10-CM

## 2018-10-27 DIAGNOSIS — I248 Other forms of acute ischemic heart disease: Secondary | ICD-10-CM | POA: Diagnosis present

## 2018-10-27 DIAGNOSIS — M810 Age-related osteoporosis without current pathological fracture: Secondary | ICD-10-CM | POA: Diagnosis present

## 2018-10-27 DIAGNOSIS — Z79899 Other long term (current) drug therapy: Secondary | ICD-10-CM | POA: Diagnosis not present

## 2018-10-27 DIAGNOSIS — Z86718 Personal history of other venous thrombosis and embolism: Secondary | ICD-10-CM | POA: Diagnosis not present

## 2018-10-27 DIAGNOSIS — I272 Pulmonary hypertension, unspecified: Secondary | ICD-10-CM | POA: Diagnosis present

## 2018-10-27 DIAGNOSIS — Z8249 Family history of ischemic heart disease and other diseases of the circulatory system: Secondary | ICD-10-CM | POA: Diagnosis not present

## 2018-10-27 DIAGNOSIS — Z9221 Personal history of antineoplastic chemotherapy: Secondary | ICD-10-CM | POA: Diagnosis not present

## 2018-10-27 DIAGNOSIS — Z95810 Presence of automatic (implantable) cardiac defibrillator: Secondary | ICD-10-CM | POA: Diagnosis not present

## 2018-10-27 DIAGNOSIS — I5043 Acute on chronic combined systolic (congestive) and diastolic (congestive) heart failure: Secondary | ICD-10-CM | POA: Diagnosis present

## 2018-10-27 LAB — PROTIME-INR
INR: 2.7 — ABNORMAL HIGH (ref 0.8–1.2)
INR: 2.6 — ABNORMAL HIGH (ref 0.8–1.2)
Prothrombin Time: 28.3 seconds — ABNORMAL HIGH (ref 11.4–15.2)
Prothrombin Time: 27.4 seconds — ABNORMAL HIGH (ref 11.4–15.2)

## 2018-10-27 LAB — BASIC METABOLIC PANEL
Anion gap: 9 (ref 5–15)
BUN: 10 mg/dL (ref 8–23)
CO2: 26 mmol/L (ref 22–32)
Calcium: 9.1 mg/dL (ref 8.9–10.3)
Chloride: 106 mmol/L (ref 98–111)
Creatinine, Ser: 1.1 mg/dL — ABNORMAL HIGH (ref 0.44–1.00)
GFR calc Af Amer: 57 mL/min — ABNORMAL LOW (ref >60)
GFR calc non Af Amer: 49 mL/min — ABNORMAL LOW (ref >60)
Glucose, Bld: 93 mg/dL (ref 70–99)
Potassium: 3.2 mmol/L — ABNORMAL LOW (ref 3.5–5.1)
Sodium: 141 mmol/L (ref 135–145)

## 2018-10-27 LAB — ECHOCARDIOGRAM COMPLETE
Height: 66 in
Weight: 2193.6 oz

## 2018-10-27 LAB — TROPONIN I
Troponin I: 0.16 ng/mL (ref <0.03)
Troponin I: 0.19 ng/mL (ref <0.03)
Troponin I: 0.25 ng/mL (ref <0.03)

## 2018-10-27 LAB — MAGNESIUM: Magnesium: 1.6 mg/dL — ABNORMAL LOW (ref 1.7–2.4)

## 2018-10-27 MED ORDER — MAGNESIUM SULFATE 2 GM/50ML IV SOLN
2.0000 g | Freq: Once | INTRAVENOUS | Status: AC
  Administered 2018-10-27: 2 g via INTRAVENOUS
  Filled 2018-10-27: qty 50

## 2018-10-27 MED ORDER — FUROSEMIDE 10 MG/ML IJ SOLN
40.0000 mg | Freq: Two times a day (BID) | INTRAMUSCULAR | Status: DC
  Administered 2018-10-27 (×2): 40 mg via INTRAVENOUS
  Filled 2018-10-27 (×3): qty 4

## 2018-10-27 MED ORDER — CARVEDILOL 3.125 MG PO TABS
3.1250 mg | ORAL_TABLET | Freq: Two times a day (BID) | ORAL | Status: DC
  Administered 2018-10-27 – 2018-10-28 (×2): 3.125 mg via ORAL
  Filled 2018-10-27 (×3): qty 1

## 2018-10-27 MED ORDER — WARFARIN - PHARMACIST DOSING INPATIENT: Freq: Every day | Status: DC

## 2018-10-27 MED ORDER — POTASSIUM CHLORIDE CRYS ER 20 MEQ PO TBCR
40.0000 meq | EXTENDED_RELEASE_TABLET | Freq: Two times a day (BID) | ORAL | Status: DC
  Administered 2018-10-27 – 2018-10-28 (×3): 40 meq via ORAL
  Filled 2018-10-27 (×3): qty 2

## 2018-10-27 MED ORDER — WARFARIN SODIUM 3 MG PO TABS
6.0000 mg | ORAL_TABLET | Freq: Every day | ORAL | Status: DC
  Administered 2018-10-27 – 2018-10-28 (×3): 6 mg via ORAL
  Filled 2018-10-27 (×3): qty 2

## 2018-10-27 NOTE — Progress Notes (Signed)
ANTICOAGULATION CONSULT NOTE - Follow Up Consult  Pharmacy Consult for Coumadin Indication: afib, mecha MVR, and h/o DVT  No Known Allergies  Patient Measurements: Height: 5\' 6"  (167.6 cm) Weight: 137 lb 1.6 oz (62.2 kg) IBW/kg (Calculated) : 59.3  Vital Signs: Temp: 97.6 F (36.4 C) (03/03 0413) Temp Source: Oral (03/03 0413) BP: 109/78 (03/03 0413) Pulse Rate: 82 (03/03 0413)  Labs: Recent Labs    10/26/18 1855 10/27/18 0057 10/27/18 0657  HGB 14.2  --   --   HCT 46.1*  --   --   PLT 129*  --   --   LABPROT  --  28.3*  --   INR  --  2.7*  --   CREATININE 0.97  --  1.10*  TROPONINI 0.19* 0.16* 0.19*    Estimated Creatinine Clearance: 42 mL/min (A) (by C-G formula based on SCr of 1.1 mg/dL (H)).   Assessment:  Anticoag: Afib, mech MVR, DVT. INR 2.7 but may drop as missed dose 3/2 (given 3/3 at 0241). Hgb 14.2 but Plts only 129. - Coumadin 6 mg daily. Admit INR 2.7  Goal of Therapy:  INR 2.5-3.5 Monitor platelets by anticoagulation protocol: Yes   Plan:  Coumadin 6mg  daily Daily INR  Crystal S. Alford Highland, PharmD, BCPS Clinical Staff Pharmacist [email or web address] Alford Highland, [employer] 10/27/2018,10:18 AM

## 2018-10-27 NOTE — Care Management Note (Signed)
Case Management Note  Patient Details  Name: Angela [name] MRN: [id number] Date of Birth: Sep 06, 1945  Subjective/Objective:  CHF               Action/Plan: Patient lives at home with spouse; PCP: Lorene Dy, MD; has private insurance with [facility] Medicare with prescription drug; pharmacy of choice is [facility]; DME - cane at home; CM following for progression of care.  Expected Discharge Date:    possibly 10/30/2018            Expected Discharge Plan:  Home/Self Care  Discharge planning Services  CM Consult  Status of Service:  In process, will continue to follow  Sherrilyn Rist [phone] 10/27/2018, 11:31 AM

## 2018-10-27 NOTE — Care Management Obs Status (Signed)
[place] NOTIFICATION   Patient Details  Name: Angela [name] MRN: [id number] Date of Birth: 1945-12-02   Medicare Observation Status Notification Given:  Other (see comment)(patient refused to sign)    Royston Bake, RN 10/27/2018, 11:30 AM

## 2018-10-27 NOTE — Evaluation (Signed)
Physical Therapy Evaluation Patient Details Name: Angela [name] MRN: [id number] DOB: 1946-07-28 Today's Date: 10/27/2018   History of Present Illness  Pt is a 73 y.o. female admitted 10/26/18 with SOB and chest pain. Worked up for CHF exacerbation; elevated troponin likely secondary to this. PMH includes dementia, gait abnormality, OA, afib on warfarin, AICD placement, DVT, colon CA, CHF.    Clinical Impression  Pt presents with an overall decrease in functional mobility secondary to above. PTA, pt mod indep with cane; lives with family available for support. Today, pt ambulatory with cane at supervision-level. Pt would benefit from continued acute PT services to maximize functional mobility and independence prior to d/c home.     Follow Up Recommendations No PT follow up;Supervision for mobility/OOB    Equipment Recommendations  None recommended by PT    Recommendations for Other Services       Precautions / Restrictions Precautions Precautions: Fall Restrictions Weight Bearing Restrictions: No      Mobility  Bed Mobility Overal bed mobility: Modified Independent             General bed mobility comments: Increased time  Transfers Overall transfer level: Needs assistance Equipment used: Quad cane Transfers: Sit to/from Stand Sit to Stand: Supervision            Ambulation/Gait Ambulation/Gait assistance: Supervision Gait Distance (Feet): 100 Feet Assistive device: Quad cane Gait Pattern/deviations: Step-through pattern;Decreased stride length;Trunk flexed;Wide base of support Gait velocity: Decreased Gait velocity interpretation: 1.31 - 2.62 ft/sec, indicative of limited community ambulator General Gait Details: Abnormal gait pattern with cane in RUE; pt does well at moving cane in R hand with LLE, although cues to keep cane much closer to body when stepping. Pt somewhat able to maintain these corrections. Supervision for safety; further distance limited by  arrival of transport for echo  Stairs            Wheelchair Mobility    Modified Rankin (Stroke Patients Only)       Balance Overall balance assessment: Needs assistance   Sitting balance-Leahy Scale: Good Sitting balance - Comments: Able to don shoes sitting EOB     Standing balance-Leahy Scale: Fair Standing balance comment: Can stand and take steps without UE support                             Pertinent Vitals/Pain Pain Assessment: No/denies pain    Home Living Family/patient expects to be discharged to:: Private residence Living Arrangements: Children;Spouse/significant other Available Help at Discharge: Family;Available 24 hours/day Type of Home: House Home Access: Stairs to enter Entrance Stairs-Rails: Right Entrance Stairs-Number of Steps: 2 Home Layout: One level Home Equipment: Walker - 2 wheels;Cane - quad Additional Comments: Son works    Prior Function Level of Independence: Independent with assistive device(s)   Gait / Transfers Assistance Needed: Indep amb with small-based quad cane. Denies falls. Husband drives  ADL's / Homemaking Assistance Needed: Husband does cooking. Both pt and husband do housework        Hand Dominance        Extremity/Trunk Assessment   Upper Extremity Assessment Upper Extremity Assessment: Overall WFL for tasks assessed    Lower Extremity Assessment Lower Extremity Assessment: Overall WFL for tasks assessed    Cervical / Trunk Assessment Cervical / Trunk Assessment: Kyphotic  Communication   Communication: No difficulties  Cognition Arousal/Alertness: Awake/alert Behavior During Therapy: WFL for tasks assessed/performed Overall Cognitive Status: History of  cognitive impairments - at baseline                                 General Comments: Per chart, h/o dementia. Pt with clear short-term memory deficits. Interacting and following commands appropriately. Question accuracy of  all details provided      General Comments      Exercises     Assessment/Plan    PT Assessment Patient needs continued PT services  PT Problem List Decreased activity tolerance;Decreased balance;Decreased mobility;Cardiopulmonary status limiting activity       PT Treatment Interventions DME instruction;Gait training;Stair training;Functional mobility training;Therapeutic activities;Therapeutic exercise;Balance training;Patient/family education    PT Goals (Current goals can be found in the Care Plan section)  Acute Rehab PT Goals Patient Stated Goal: Return home PT Goal Formulation: With patient Time For Goal Achievement: 11/10/18 Potential to Achieve Goals: Good    Frequency Min 3X/week   Barriers to discharge        Co-evaluation               AM-PAC PT "6 Clicks" Mobility  Outcome Measure Help needed turning from your back to your side while in a flat bed without using bedrails?: None Help needed moving from lying on your back to sitting on the side of a flat bed without using bedrails?: None Help needed moving to and from a bed to a chair (including a wheelchair)?: None Help needed standing up from a chair using your arms (e.g., wheelchair or bedside chair)?: A Little Help needed to walk in hospital room?: A Little Help needed climbing 3-5 steps with a railing? : A Little 6 Click Score: 21    End of Session Equipment Utilized During Treatment: Gait belt Activity Tolerance: Patient tolerated treatment well Patient left: in bed;with call bell/phone within reach(with transport for echo) Nurse Communication: Mobility status PT Visit Diagnosis: Other abnormalities of gait and mobility (R26.89)    Time: 3546-5681 PT Time Calculation (min) (ACUTE ONLY): 17 min   Charges:   PT Evaluation $PT Eval Moderate Complexity: [place], PT, DPT Acute Rehabilitation Services  Pager [phone] Office [place] 10/27/2018,  10:08 AM

## 2018-10-27 NOTE — Progress Notes (Signed)
Pt with 7 beats of wide complex tachycardia. Pt asymptomatic. Last EF in 2014 was 20%. MD paged. Orders received for Magnesium level in AM. Will continue to monitor.

## 2018-10-27 NOTE — Evaluation (Signed)
Occupational Therapy Evaluation Patient Details Name: Angela [name] MRN: [id number] DOB: 11-14-1945 Today's Date: 10/27/2018    History of Present Illness Pt is a 73 y.o. female admitted 10/26/18 with SOB and chest pain. Worked up for CHF exacerbation; elevated troponin likely secondary to this. PMH includes dementia, gait abnormality, OA, afib on warfarin, AICD placement, DVT, colon CA, CHF.   Clinical Impression   This 73 y/o female presents with the above. PTA pt was completing functional mobility using quad cane, reports was receiving intermittent assist from spouse for ADL and iADL completion. Pt performing room level mobility today using quad cane with overall minguard assist. She currently requires minA for toileting and standing grooming ADL, min-modA for LB ADL. Pt pleasant and willing to participate in therapy session, with history of cognitive impairments at baseline though consistently following one step commands throughout session. She will benefit from continued OT services while in acute setting to maximize her safety and independence with ADL and mobility. Will follow.     Follow Up Recommendations  No OT follow up;Supervision/Assistance - 24 hour    Equipment Recommendations  Tub/shower seat           Precautions / Restrictions Precautions Precautions: Fall Restrictions Weight Bearing Restrictions: No      Mobility Bed Mobility Overal bed mobility: Modified Independent             General bed mobility comments: Increased time  Transfers Overall transfer level: Needs assistance Equipment used: Quad cane Transfers: Sit to/from Stand Sit to Stand: Min guard         General transfer comment: for general safety, increased time and effort to rise to standing    Balance Overall balance assessment: Needs assistance   Sitting balance-Leahy Scale: Good     Standing balance support: During functional activity;Single extremity supported Standing balance-Leahy  Scale: Fair                             ADL either performed or assessed with clinical judgement   ADL Overall ADL's : Needs assistance/impaired Eating/Feeding: Set up;Sitting   Grooming: Standing;Wash/dry hands;Minimal assistance Grooming Details (indicate cue type and reason): assist to locate items, minguard-minA standing balance Upper Body Bathing: Min guard;Sitting   Lower Body Bathing: Minimal assistance;Sit to/from stand   Upper Body Dressing : Min guard;Sitting   Lower Body Dressing: Minimal assistance;Moderate assistance;Sit to/from stand   Toilet Transfer: Min guard;Minimal assistance;Ambulation Toilet Transfer Details (indicate cue type and reason): using quad cane; assist to rise from lower surface toilet height Toileting- Clothing Manipulation and Hygiene: Minimal assistance;Sit to/from stand Toileting - Clothing Manipulation Details (indicate cue type and reason): assist for clothing management (gown and underwear); pt able to perform peri-care after voiding bladder     Functional mobility during ADLs: Min guard;Cane       Vision         Perception     Praxis      Pertinent Vitals/Pain Pain Assessment: No/denies pain     Hand Dominance     Extremity/Trunk Assessment Upper Extremity Assessment Upper Extremity Assessment: Generalized weakness   Lower Extremity Assessment Lower Extremity Assessment: Defer to PT evaluation   Cervical / Trunk Assessment Cervical / Trunk Assessment: Kyphotic   Communication Communication Communication: No difficulties   Cognition Arousal/Alertness: Awake/alert Behavior During Therapy: WFL for tasks assessed/performed Overall Cognitive Status: History of cognitive impairments - at baseline  General Comments: per chart hx of dementia, pt pleasantly confused this session, follows one step commands consistently throughout session   General Comments  sister  present end of session    Exercises     Shoulder Instructions      Home Living Family/patient expects to be discharged to:: Private residence Living Arrangements: Children;Spouse/significant other Available Help at Discharge: Family;Available 24 hours/day Type of Home: House Home Access: Stairs to enter [employer] of Steps: 2 Entrance Stairs-Rails: Right Home Layout: One level     Bathroom Shower/Tub: Tub/shower unit         Home Equipment: Environmental consultant - 2 wheels;Cane - quad   Additional Comments: Son works      Prior Functioning/Environment Level of Independence: Independent with assistive device(s)  Gait / Transfers Assistance Needed: Indep amb with small-based quad cane. Denies falls. Husband drives ADL's / Homemaking Assistance Needed: pt reports spouse intermittently assists with ADL PRN. Husband does cooking. Both pt and husband do housework            OT Problem List: Decreased strength;Decreased range of motion;Decreased activity tolerance;Impaired balance (sitting and/or standing);Decreased cognition      OT Treatment/Interventions: Self-care/ADL training;Therapeutic exercise;Energy conservation;Therapeutic activities;Patient/family education;Balance training;DME and/or AE instruction    OT Goals(Current goals can be found in the care plan section) Acute Rehab OT Goals Patient Stated Goal: Return home OT Goal Formulation: With patient Time For Goal Achievement: 11/10/18 Potential to Achieve Goals: Good  OT Frequency: Min 2X/week   Barriers to D/C:            Co-evaluation              AM-PAC OT "6 Clicks" Daily Activity     Outcome Measure Help from another person eating meals?: None Help from another person taking care of personal grooming?: A Little Help from another person toileting, which includes using toliet, bedpan, or urinal?: A Little Help from another person bathing (including washing, rinsing, drying)?: A Little Help from  another person to put on and taking off regular upper body clothing?: None Help from another person to put on and taking off regular lower body clothing?: A Lot 6 Click Score: 19   End of Session Equipment Utilized During Treatment: Other (comment)(cane) Nurse Communication: Mobility status  Activity Tolerance: Patient tolerated treatment well Patient left: in bed;with call bell/phone within reach;with bed alarm set;with family/visitor present  OT Visit Diagnosis: Muscle weakness (generalized) (M62.81);Unsteadiness on feet (R26.81)                Time: 2440-1027 OT Time Calculation (min): 18 min Charges:  OT General Charges $OT Visit: 1 Visit OT Evaluation $OT Eval Moderate Complexity: 1 Mod  Lou Cal, OT [employer] Pager [phone] Office [phone]   Raymondo Band 10/27/2018, 4:26 PM

## 2018-10-27 NOTE — Progress Notes (Signed)
ANTICOAGULATION CONSULT NOTE - Initial Consult  Pharmacy Consult for Coumadin Indication: h/o DVT, Afib and mechanical MVR  No Known Allergies  Patient Measurements: Height: 5\' 6"  (167.6 cm) Weight: 137 lb 1.6 oz (62.2 kg) IBW/kg (Calculated) : 59.3  Vital Signs: Temp: 99 F (37.2 C) (03/02 2342) Temp Source: Oral (03/02 2342) BP: 139/89 (03/02 2342) Pulse Rate: 62 (03/02 2342)  Labs: Recent Labs    10/26/18 1855 10/27/18 0057  HGB 14.2  --   HCT 46.1*  --   PLT 129*  --   LABPROT  --  28.3*  INR  --  2.7*  CREATININE 0.97  --   TROPONINI 0.19* 0.16*    Estimated Creatinine Clearance: 47.6 mL/min (by C-G formula based on SCr of 0.97 mg/dL).   Medical History: Past Medical History:  Diagnosis Date  . Abnormality of gait 08/23/2014  . Arthritis   . Atrial fibrillation ([place])    A.  Chronic Coumadin  . Cardiac arrest - ventricular fibrillation    A.  12/2007;  B. 01/2008 [employer] Promote Bi-V ICD placed  . CVA (cerebral vascular accident) ([place])   . Diverticular disease   . DVT (deep venous thrombosis) ([place])   . 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 History of colon cancer    A.  1999 - T3, N1  chemotherapy  . Internal hemorrhoids   . Memory deficit 08/23/2014  . Nonischemic cardiomyopathy ([place])    a.  11/2001 - Cath - NL Cors; b. ECHO 09/22/11: EF 20%, MVR normal, moderate LAE, mild RAE c. ECHO (08/2012): ED 20%, diff HK, LA mod dilated, mild/mod TR, RV mild/mod decreased sys fx  . Rectal bleeding 04/28/2017  . Rheumatic heart disease    A. 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 Sick sinus syndrome ([place])    A.  s/p pacer in 1995.  B.    . Systolic CHF, chronic ([place])    A.  01/2008 Echo - EF 10-20%    Medications:  Medications Prior to Admission  Medication Sig Dispense Refill Last Dose  . alendronate (FOSAMAX) 70 MG tablet Take 70 mg by mouth once a week. Take with a full glass of water on an empty  stomach.   10/23/2018  . carvedilol (COREG) 3.125 MG tablet Take 1 tablet (3.125 mg total) by mouth 2 (two) times daily with a meal. 60 tablet 12 10/26/2018 at 0830  . cholecalciferol (VITAMIN D) 1000 UNITS tablet Take 2,000 Units by mouth daily.   10/25/2018 at Unknown time  . digoxin (LANOXIN) 0.125 MG tablet Take 0.125 mg by mouth daily.   10/26/2018 at Unknown time  . furosemide (LASIX) 40 MG tablet TAKE ONE (1) TABLET BY MOUTH EVERY DAY. IF WEIGHT IS GREATER THAN 140LBS ON HOME SCALE, TAKE 2 TABLETS (80MG ) (Patient taking differently: Take 20 mg by mouth daily as needed for fluid. ) 60 tablet 3 Past Week at Unknown time  . tiZANidine (ZANAFLEX) 4 MG tablet Take 4 mg by mouth 2 (two) times daily.    10/26/2018 at am  . warfarin (COUMADIN) 4 MG tablet Take 2 tablets (8 mg total) by mouth daily at 6 PM. (Patient taking differently: Take 6 mg by mouth daily at 6 PM. ) 28 tablet 0 10/25/2018 at 1800    Assessment: 73 y.o. female admitted with CHF, h/o Afib and MVR, to continue Coumadin   Goal of Therapy:  INR 2.5-3.5 Monitor platelets by anticoagulation protocol: Yes   Plan:  Continue Coumadin 6 mg daily Daily INR  Caryl Pina 10/27/2018,2:24 AM

## 2018-10-27 NOTE — Progress Notes (Signed)
  Echocardiogram 2D Echocardiogram has been performed.  Jennette Dubin 10/27/2018, 10:15 AM

## 2018-10-27 NOTE — Discharge Instructions (Signed)

## 2018-10-27 NOTE — Progress Notes (Signed)
PROGRESS NOTE  Angela [name]  BZJ:[id number] DOB: June 08, 1946 DOA: 10/26/2018 PCP: Lorene Dy, MD   Brief Narrative: Angela [name] is a 73 y.o. female with a history of chronic AFib, s/p mechanical MVR, history of DVT, SSS s/p PPM, chronic HFrEF and cardiac arrest s/p AICD, among others who presented to the ED with progressive dyspnea worse on exertion, orthopnea, leg swelling, and chest pain. She was hypertensive with otherwise stable vital signs, CBC normal, BMP with bicarb of 18, otherwise unremarkable, troponin 0.19, BNP 1092, and digoxin level 0.6. ECG was paced. She appeared volume overloaded confirmed by CXR demonstrating bilateral pleural effusions and was admitted for acute CHF with lasix and cardiology consultations ordered.  Assessment & Plan: Principal Problem:   Acute on chronic systolic congestive heart failure (HCC) Active Problems:   Atrial fibrillation (HCC)   Dementia (HCC)   Elevated troponin   Anemia   Acute on chronic systolic (congestive) heart failure (HCC)  Acute on chronic HFrEF:  - Remains volume overloaded on exam, will convert to inpatient and continue IV lasix. May need augmentation of dose. Will defer to cardiology and order 40 mg IV BID. - Echocardiogram pending, last was 2014.  - Continue I/O, daily weights, Cr, K monitoring. Weight down 144 > 137lbs. - Started low dose lisinopril.  Troponin elevation: Suspect this is due to demand ischemia in setting of decompensated heart failure. Paced ECG. - Trending troponin, level 0.19 > 0.11 and since slowly trending upward to 0.25. No active chest pain currently. - Further intervention per cardiology, history has been nonischemic cardiomyopathy.  - Monitor for WMA on echo.  Chronic atrial fibrillation, rheumatic heart disease s/p mechanical MVR 1983, SSS s/p PPM 1995, history of VFib arrest 2009:  - Holding coreg per cardiology recommendation - Continue coumadin per pharmacy - Continue digoxin, level was 0.6.    - Echo pending to evaluate valve. - PPM interrogation per cardiology.  Hypokalemia:  - Supplement this AM  Dementia:  - Delirium precautions  Osteoporosis:  - Hold bisphosphonate, vitamin D while admitted  History of right iliofemoral thrombosis s/p embolectomy 2003:  - Coumadin as above  DVT prophylaxis: Coumadin Code Status: Full Family Communication: None at bedside this AM Disposition Plan: Home once clinically improved, remains overloaded and requires aggressive IV diuresis so will transition to inpatient status.  Consultants:   Cardiology  Procedures:   Echocardiogram   Antimicrobials:  None   Subjective: Reports stabler dyspnea worse on exertion but not currently experiencing chest pain. No other complaints.   Objective: Vitals:   10/26/18 2342 10/27/18 0211 10/27/18 0413 10/27/18 1244  BP: 139/89  109/78 112/62  Pulse: 62  82 (!) 41  Resp: 18  18   Temp: 99 F (37.2 C)  97.6 F (36.4 C) 97.8 F (36.6 C)  TempSrc: Oral  Oral Oral  SpO2: 100%  93% 99%  Weight:  62.2 kg    Height:        Intake/Output Summary (Last 24 hours) at 10/27/2018 1421 Last data filed at 10/27/2018 0243 Gross per 24 hour  Intake 240 ml  Output 750 ml  Net -510 ml   Filed Weights   10/26/18 2257 10/27/18 0211  Weight: 65.4 kg 62.2 kg    Gen: 73 y.o. female in no distress Pulm: Non-labored breathing, diminished to midlung zones/bases CV: Paced with mechanical S1, no significant murmur. +JVD, 2+ pitting LE brawny edema. GI: Abdomen soft, non-tender, non-distended, with normoactive bowel sounds. No organomegaly or masses felt. Ext: Warm,  no deformities Skin: No rashes, lesions or ulcers Neuro: Alert, oriented x2. No focal neurological deficits. Psych: Judgement and insight appear limited. Mood & affect appropriate.   Data Reviewed: I have personally reviewed following labs and imaging studies  CBC: Recent Labs  Lab 10/26/18 1855  WBC 3.6*  HGB 14.2  HCT 46.1*   MCV 94.9  PLT 277*   Basic Metabolic Panel: Recent Labs  Lab 10/26/18 1855 10/27/18 0657  NA 140 141  K 4.2 3.2*  CL 110 106  CO2 18* 26  GLUCOSE 109* 93  BUN 11 10  CREATININE 0.97 1.10*  CALCIUM 9.3 9.1  MG  --  1.6*   GFR: Estimated Creatinine Clearance: 42 mL/min (A) (by C-G formula based on SCr of 1.1 mg/dL (H)). Liver Function Tests: No results for input(s): AST, ALT, ALKPHOS, BILITOT, PROT, ALBUMIN in the last 168 hours. No results for input(s): LIPASE, AMYLASE in the last 168 hours. No results for input(s): AMMONIA in the last 168 hours. Coagulation Profile: Recent Labs  Lab 10/27/18 0057 10/27/18 1249  INR 2.7* 2.6*   Cardiac Enzymes: Recent Labs  Lab 10/26/18 1855 10/27/18 0057 10/27/18 0657 10/27/18 1249  TROPONINI 0.19* 0.16* 0.19* 0.25*   BNP (last 3 results) No results for input(s): PROBNP in the last 8760 hours. HbA1C: No results for input(s): HGBA1C in the last 72 hours. CBG: No results for input(s): GLUCAP in the last 168 hours. Lipid Profile: No results for input(s): CHOL, HDL, LDLCALC, TRIG, CHOLHDL, LDLDIRECT in the last 72 hours. Thyroid Function Tests: No results for input(s): TSH, T4TOTAL, FREET4, T3FREE, THYROIDAB in the last 72 hours. Anemia Panel: No results for input(s): VITAMINB12, FOLATE, FERRITIN, TIBC, IRON, RETICCTPCT in the last 72 hours. Urine analysis:    Component Value Date/Time   COLORURINE YELLOW 10/23/2010 2318   APPEARANCEUR CLEAR 10/23/2010 2318   LABSPEC 1.011 10/23/2010 2318   PHURINE 5.0 10/23/2010 2318   GLUCOSEU NEGATIVE 02/03/2008 1545   HGBUR NEGATIVE 10/23/2010 2318   BILIRUBINUR NEGATIVE 10/23/2010 2318   KETONESUR NEGATIVE 10/23/2010 2318   PROTEINUR NEGATIVE 10/23/2010 2318   UROBILINOGEN 0.2 10/23/2010 2318   NITRITE NEGATIVE 10/23/2010 2318   LEUKOCYTESUR  10/23/2010 2318    NEGATIVE MICROSCOPIC NOT DONE ON URINES WITH NEGATIVE PROTEIN, BLOOD, LEUKOCYTES, NITRITE, OR GLUCOSE <1000 mg/dL.   No  results found for this or any previous visit (from the past 240 hour(s)).    Radiology Studies: Dg Chest 2 View  Result Date: 10/26/2018 CLINICAL DATA:  Chest pain and shortness of breath. EXAM: CHEST - 2 VIEW COMPARISON:  Radiograph 07/07/2012 FINDINGS: Post median sternotomy. Right and left-sided pacemakers remain in place. Prosthetic mitral valve. Cardiomegaly is unchanged from prior exam. Aortic atherosclerosis. There are small bilateral pleural effusions with associated bibasilar opacities. Vascular congestion without overt pulmonary edema. No pneumothorax. Grossly stable osseous structures. IMPRESSION: Findings suggesting fluid overload with vascular congestion, bilateral pleural effusions and bibasilar atelectasis/airspace opacities. Cardiomegaly which is similar to prior exam. Electronically Signed   By: Keith Rake M.D.   On: 10/26/2018 20:17    Scheduled Meds: . carvedilol  3.125 mg Oral BID WC  . digoxin  0.125 mg Oral Daily  . furosemide  40 mg Intravenous BID  . lisinopril  2.5 mg Oral Daily  . potassium chloride  40 mEq Oral BID  . sodium chloride flush  3 mL Intravenous Once  . tiZANidine  4 mg Oral BID  . warfarin  6 mg Oral q1800  . Warfarin -  Pharmacist Dosing Inpatient   Does not apply q1800   Continuous Infusions:   LOS: 0 days   Time spent: 25 minutes.  Patrecia Pour, MD [facility] [email or web address] Password [email or web address] 10/27/2018, 2:21 PM

## 2018-10-27 NOTE — Consult Note (Signed)
Cardiology Consultation:   Patient ID: Angela [name] MRN: [id number]; DOB: 11-25-1945  Admit date: 10/26/2018 Date of Consult: 10/27/2018  Primary Care Provider: Lorene Dy, MD Primary Cardiologist: Angela Peru, MD  Primary Electrophysiologist:  None   Patient Profile:   Angela [name] is a 73 y.o. female with a hx of Afib on coumadin, cardiac 2/2 to v-fib s/p Bi-V ICD, DVT, NICM (EF 20%), mechanical MVR and colon CA who is being seen today for the evaluation of CHF at the request of Dr. Bonner Puna.  History of Present Illness:   Angela [name] is a 73 yo female with PMH noted above. She is followed by Dr. Lovena Le as an outpatient. Last visit on 7/19 was at her baseline with some mild swelling in her legs. She was continued on her home medications including lasix 40mg  daily. Last device check on 12/19 with no changes, but 8 episodes of NSVT.   She currently lives at home, reports being in her usual state of health until a few days ago. Became progressively short of breath. Says she weighs herself daily and weight has been stable. Also says she is compliant with her lasix, though previous notes indicate that she occasionally skips doses. No chest pain. Leg edema has been able to the same. She has been eating frozen dinners and canned foods at home.   In the ED her labs showed stable electrolytes, BNP 1092, Trop 0.19>>.0.16>>0.19>>0.25, Hgb 14.2, INR 2.7, dig 0.6. EKG with vpacing. CXR with edema. Started on IV lasix on admission.   Past Medical History:  Diagnosis Date  . Abnormality of gait 08/23/2014  . Arthritis   . Atrial fibrillation ([place])    A.  Chronic Coumadin  . Cardiac arrest - ventricular fibrillation    A.  12/2007;  B. 01/2008 St. Jude Promote Bi-V ICD placed  . CVA (cerebral vascular accident) ([place])   . Diverticular disease   . DVT (deep venous thrombosis) ([place])   . 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 History of  colon cancer    A.  1999 - T3, N1  chemotherapy  . Internal hemorrhoids   . Memory deficit 08/23/2014  . Nonischemic cardiomyopathy ([place])    a.  11/2001 - Cath - NL Cors; b. ECHO 09/22/11: EF 20%, MVR normal, moderate LAE, mild RAE c. ECHO (08/2012): ED 20%, diff HK, LA mod dilated, mild/mod TR, RV mild/mod decreased sys fx  . Rectal bleeding 04/28/2017  . Rheumatic heart disease    A. 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 Sick sinus syndrome ([place])    A.  s/p pacer in 1995.  B.    . Systolic CHF, chronic ([place])    A.  01/2008 Echo - EF 10-20%    Past Surgical History:  Procedure Laterality Date  . ABDOMINAL HYSTERECTOMY    . AV NODE ABLATION N/A 09/23/2011   Procedure: AV NODE ABLATION;  Surgeon: Evans Lance, MD;  Location[facility] CATH LAB;  Service: Cardiovascular;  Laterality: N/A;  . BIV ICD GENERTAOR CHANGE OUT N/A 09/05/2014   Procedure: BIV ICD GENERTAOR CHANGE OUT;  Surgeon: Evans Lance, MD;  Location[facility] CATH LAB;  Service: Cardiovascular;  Laterality: N/A;  . Biventricular AICD    . COLON SURGERY  1999  . History of echocardiogram  2003, 2007, 2009  . Mitral Valve Replacement, Bjork-Shiley valve  1983     Home Medications:  Prior to  Admission medications   Medication Sig Start Date End Date Taking? Authorizing Provider  alendronate (FOSAMAX) 70 MG tablet Take 70 mg by mouth once a week. Take with a full glass of water on an empty stomach.   Yes [provider]  carvedilol (COREG) 3.125 MG tablet Take 1 tablet (3.125 mg total) by mouth 2 (two) times daily with a meal. 03/18/18  Yes Evans Lance, MD  cholecalciferol (VITAMIN D) 1000 UNITS tablet Take 2,000 Units by mouth daily.   Yes [provider]  digoxin (LANOXIN) 0.125 MG tablet Take 0.125 mg by mouth daily.   Yes [provider]  furosemide (LASIX) 40 MG tablet TAKE ONE (1) TABLET BY MOUTH EVERY DAY. IF WEIGHT IS GREATER THAN 140LBS ON HOME SCALE, TAKE 2 TABLETS (80MG ) Patient taking differently: Take  20 mg by mouth daily as needed for fluid.  02/03/17  Yes Evans Lance, MD  tiZANidine (ZANAFLEX) 4 MG tablet Take 4 mg by mouth 2 (two) times daily.    Yes [provider]  warfarin (COUMADIN) 4 MG tablet Take 2 tablets (8 mg total) by mouth daily at 6 PM. Patient taking differently: Take 6 mg by mouth daily at 6 PM.  05/08/17  Yes Johnson, Clanford L, MD    Inpatient Medications: Scheduled Meds: . carvedilol  3.125 mg Oral BID WC  . digoxin  0.125 mg Oral Daily  . furosemide  40 mg Intravenous BID  . lisinopril  2.5 mg Oral Daily  . potassium chloride  40 mEq Oral BID  . sodium chloride flush  3 mL Intravenous Once  . tiZANidine  4 mg Oral BID  . warfarin  6 mg Oral q1800  . Warfarin - Pharmacist Dosing Inpatient   Does not apply q1800   Continuous Infusions:  PRN Meds: acetaminophen **OR** acetaminophen  Allergies:   No Known Allergies  Social History:   Social History   Socioeconomic History  . Marital status: Married    Spouse name: Not on file  . Number of children: 1  . Years of education: Not on file  . Highest education level: Not on file  Occupational History  . Occupation: Retired    Fish farm manager: RETIRED  Social Needs  . Financial resource strain: Not on file  . Food insecurity:    Worry: Not on file    Inability: Not on file  . Transportation needs:    Medical: Not on file    Non-medical: Not on file  Tobacco Use  . Smoking status: Never Smoker  . Smokeless tobacco: Never Used  Substance and Sexual Activity  . Alcohol use: No  . Drug use: No  . Sexual activity: Never  Lifestyle  . Physical activity:    Days per week: Not on file    Minutes per session: Not on file  . Stress: Not on file  Relationships  . Social connections:    Talks on phone: Not on file    Gets together: Not on file    Attends religious service: Not on file    Active member of club or organization: Not on file    Attends meetings of clubs or organizations: Not on file      Relationship status: Not on file  . Intimate partner violence:    Fear of current or ex partner: Not on file    Emotionally abused: Not on file    Physically abused: Not on file    Forced sexual activity: Not on file  Other Topics Concern  . Not on file  Social History Narrative   The patient is married and has 1 child.  Lives in [place] with husband.  She is retired from working at [employer].  She does not smoke or drink    Family History:    Family History  Problem Relation Age of Onset  . Diabetes Mother 58  . Coronary artery disease Mother        s/p cabg  . Lung cancer Father 63       smoker  . Coronary artery disease Sister        s/p cabg     ROS:  Please see the history of present illness.   All other ROS reviewed and negative.     Physical Exam/Data:   Vitals:   10/26/18 2342 10/27/18 0211 10/27/18 0413 10/27/18 1244  BP: 139/89  109/78 112/62  Pulse: 62  82 (!) 41  Resp: 18  18   Temp: 99 F (37.2 C)  97.6 F (36.4 C) 97.8 F (36.6 C)  TempSrc: Oral  Oral Oral  SpO2: 100%  93% 99%  Weight:  62.2 kg    Height:        Intake/Output Summary (Last 24 hours) at 10/27/2018 1542 Last data filed at 10/27/2018 0243 Gross per 24 hour  Intake 240 ml  Output 750 ml  Net -510 ml   Last 3 Weights 10/27/2018 10/26/2018 02/23/2018  Weight (lbs) 137 lb 1.6 oz 144 lb 1.6 oz 149 lb  Weight (kg) 62.188 kg 65.363 kg 67.586 kg     Body mass index is 22.13 kg/m.  General:  Well nourished, well developed, in no acute distress HEENT: normal Neck: no JVD Endocrine:  No thryomegaly Vascular: No carotid bruits; FA pulses 2+ bilaterally without bruits  Cardiac:  normal S1, S2; Irreg Irreg; crisp mechanical valve click Lungs:  clear to auscultation bilaterally, no wheezing, rhonchi or rales  Abd: soft, nontender, no hepatomegaly  Ext: no edema Musculoskeletal:  No deformities, BUE and BLE strength normal and equal Skin: warm and dry  Neuro:  CNs 2-12 intact, no focal  abnormalities noted Psych:  Normal affect   EKG:  The EKG was personally reviewed and demonstrates:  Vpaced Telemetry:  Telemetry was personally reviewed and demonstrates:  Vpaced with NSVT  Relevant CV Studies:  TTE: 10/27/2018  IMPRESSIONS    1. The left ventricle has a visually estimated ejection fraction of of 15-20%. The cavity size was severely dilated. Left ventricular diastolic Doppler parameters are indeterminate due to mitral valve replacement/repair. Left ventricular diffuse  hypokinesis.  2. The right ventricle has moderately reduced systolic function. The cavity was normal. There is no increase in right ventricular wall thickness.  3. Left atrial size was severely dilated.  4. Right atrial size was severely dilated.  5. A mechanical valve is present in the mitral position. Procedure Date: 1983.  6. Bjork-Shiley mechanical mitral valve prosthesis functioning properly.  7. The tricuspid valve is normal in structure.  8. The aortic valve is normal in structure.  9. The pulmonic valve was normal in structure. Pulmonic valve regurgitation is mild by color flow Doppler. 10. The inferior vena cava was normal in size with <50% respiratory variability.  Laboratory Data:  Chemistry Recent Labs  Lab 10/26/18 1855 10/27/18 0657  NA 140 141  K 4.2 3.2*  CL 110 106  CO2 18* 26  GLUCOSE 109* 93  BUN 11 10  CREATININE 0.97 1.10*  CALCIUM 9.3 9.1  GFRNONAA 58* 49*  GFRAA >60 57*  ANIONGAP 12 9    No results for input(s): PROT, ALBUMIN, AST, ALT, ALKPHOS, BILITOT in the last 168 hours. Hematology Recent Labs  Lab 10/26/18 1855  WBC 3.6*  RBC 4.86  HGB 14.2  HCT 46.1*  MCV 94.9  MCH 29.2  MCHC 30.8  RDW 15.0  PLT 129*   Cardiac Enzymes Recent Labs  Lab 10/26/18 1855 10/27/18 0057 10/27/18 0657 10/27/18 1249  TROPONINI 0.19* 0.16* 0.19* 0.25*    Recent Labs  Lab 10/26/18 1907  TROPIPOC 0.11*    BNP Recent Labs  Lab 10/26/18 1855  BNP 1,092.7*      DDimer No results for input(s): DDIMER in the last 168 hours.  Radiology/Studies:  Dg Chest 2 View  Result Date: 10/26/2018 CLINICAL DATA:  Chest pain and shortness of breath. EXAM: CHEST - 2 VIEW COMPARISON:  Radiograph 07/07/2012 FINDINGS: Post median sternotomy. Right and left-sided pacemakers remain in place. Prosthetic mitral valve. Cardiomegaly is unchanged from prior exam. Aortic atherosclerosis. There are small bilateral pleural effusions with associated bibasilar opacities. Vascular congestion without overt pulmonary edema. No pneumothorax. Grossly stable osseous structures. IMPRESSION: Findings suggesting fluid overload with vascular congestion, bilateral pleural effusions and bibasilar atelectasis/airspace opacities. Cardiomegaly which is similar to prior exam. Electronically Signed   By: Keith Rake M.D.   On: 10/26/2018 20:17    Assessment and Plan:   Sontee Desena is a 73 y.o. female with a hx of Afib on coumadin, cardiac 2/2 to v-fib s/p Bi-V ICD, DVT, NICM (EF 20%), mechanical MVR and colon CA who is being seen today for the evaluation of CHF at the request of Dr. Bonner Puna.  1. Acute on chronic systolic HF: Has been diuresing with IV lasix with breathing improved. Weight is trending down. UOP 750 thus far.  -- would continue with IV lasix for now -- needs better diet control, discussed the need for Na+ restrictions and daily weights at home -- may need higher home dose of lasix -- on BB, consider adding ARB  2. NICM s/p ICD: last transmission 12/19 with brief episodes of NSVT.   3. S/p mechanical MVR: coumadin per PharmD. 2.7 today. Valve stable on echo.  4. Afib: vpaced on telemetry  5. Elevated troponin 2/2 acute CHF: trop with flat trend. Suspect demand ischemia in the absence of chest pain.  For questions or updates, please contact [place] Please consult [email or web address] for contact info under   Signed, Reino Bellis, NP  10/27/2018 3:42 PM

## 2018-10-27 NOTE — Progress Notes (Signed)
Pt and spouse refuse bed alarm due to pt's frequent urination. Will continue to monitor.

## 2018-10-28 DIAGNOSIS — I495 Sick sinus syndrome: Secondary | ICD-10-CM

## 2018-10-28 DIAGNOSIS — I428 Other cardiomyopathies: Secondary | ICD-10-CM

## 2018-10-28 DIAGNOSIS — I099 Rheumatic heart disease, unspecified: Secondary | ICD-10-CM

## 2018-10-28 DIAGNOSIS — Z7901 Long term (current) use of anticoagulants: Secondary | ICD-10-CM

## 2018-10-28 DIAGNOSIS — Z95810 Presence of automatic (implantable) cardiac defibrillator: Secondary | ICD-10-CM

## 2018-10-28 LAB — BASIC METABOLIC PANEL
Anion gap: 11 (ref 5–15)
BUN: 11 mg/dL (ref 8–23)
CO2: 29 mmol/L (ref 22–32)
Calcium: 9.5 mg/dL (ref 8.9–10.3)
Chloride: 103 mmol/L (ref 98–111)
Creatinine, Ser: 1.18 mg/dL — ABNORMAL HIGH (ref 0.44–1.00)
GFR calc Af Amer: 53 mL/min — ABNORMAL LOW (ref >60)
GFR calc non Af Amer: 45 mL/min — ABNORMAL LOW (ref >60)
Glucose, Bld: 88 mg/dL (ref 70–99)
Potassium: 3.5 mmol/L (ref 3.5–5.1)
Sodium: 143 mmol/L (ref 135–145)

## 2018-10-28 LAB — MAGNESIUM: Magnesium: 1.6 mg/dL — ABNORMAL LOW (ref 1.7–2.4)

## 2018-10-28 LAB — PROTIME-INR
INR: 2.8 — ABNORMAL HIGH (ref 0.8–1.2)
Prothrombin Time: 28.9 seconds — ABNORMAL HIGH (ref 11.4–15.2)

## 2018-10-28 MED ORDER — LISINOPRIL 2.5 MG PO TABS: 2.5000 mg | ORAL_TABLET | Freq: Every day | ORAL | 0 refills | Status: DC

## 2018-10-28 MED ORDER — FUROSEMIDE 40 MG PO TABS: 40.0000 mg | ORAL_TABLET | Freq: Every day | ORAL | Status: DC

## 2018-10-28 MED ORDER — FUROSEMIDE 10 MG/ML IJ SOLN
40.0000 mg | Freq: Two times a day (BID) | INTRAMUSCULAR | Status: DC
  Administered 2018-10-28: 40 mg via INTRAVENOUS

## 2018-10-28 MED ORDER — MAGNESIUM SULFATE 2 GM/50ML IV SOLN
2.0000 g | Freq: Once | INTRAVENOUS | Status: AC
  Administered 2018-10-28: 2 g via INTRAVENOUS
  Filled 2018-10-28: qty 50

## 2018-10-28 NOTE — Progress Notes (Signed)
[place] for Coumadin Indication: afib, mecha MVR, and h/o DVT  Patient Measurements: Height: 5\' 6"  (167.6 cm) Weight: 143 lb 8 oz (65.1 kg) IBW/kg (Calculated) : 59.3  Vital Signs: Temp: 98.3 F (36.8 C) (03/04 0522) Temp Source: Oral (03/04 0522) BP: 114/67 (03/04 0522) Pulse Rate: 64 (03/04 0522)  Labs: Recent Labs    10/26/18 1855 10/27/18 0057 10/27/18 0657 10/27/18 1249 10/28/18 0353  HGB 14.2  --   --   --   --   HCT 46.1*  --   --   --   --   PLT 129*  --   --   --   --   LABPROT  --  28.3*  --  27.4* 28.9*  INR  --  2.7*  --  2.6* 2.8*  CREATININE 0.97  --  1.10*  --  1.18*  TROPONINI 0.19* 0.16* 0.19* 0.25*  --      Assessment: Pt is on warfarin prior to admission for AFib, mech MVR, DVT. Missed warfarin dose 3/2. Hgb 14.2, plts 129. Current INR 2.8. PTA warfarin dose: 6 mg daily.   Goal of Therapy:  INR 2.5-3.5 Monitor platelets by anticoagulation protocol: Yes   Plan:  Warfarin 6 mg po daily Daily INR   Harvel Quale 10/28/2018 9:19 AM

## 2018-10-28 NOTE — Discharge Summary (Addendum)
Physician Discharge Summary  Angela [name] IRC:[id number] DOB: [birth year] DOA: 10/26/2018  PCP: Lorene Dy, MD  Admit date: 10/26/2018 Discharge date: 10/28/2018  Admitted From: Home Disposition: Home  Recommendations for Outpatient Follow-up:  1. Continue daily weights  Discharge Condition: Stable CODE STATUS: Full code Diet recommendation: Low-sodium heart healthy Consultations:  Cardiology   Discharge Diagnoses:  Principal Problem:   Acute on chronic systolic (congestive) heart failure (HCC) Active Problems:   Elevated troponin   Nonischemic cardiomyopathy (HCC)   Atrial fibrillation (HCC)   Rheumatic heart disease   Sick sinus syndrome (HCC)   Warfarin anticoagulation   Dementia ([place])   ICD (implantable cardioverter-defibrillator), biventricular, in situ   Anemia chronic disease     Brief Summary: Angela [name] is a 73 y.o. female with a history of chronic AFib, s/p mechanical MVR, history of DVT, SSS s/p PPM, chronic HFrEF and cardiac arrest s/p AICD, among others who presented to the ED with progressive dyspnea worse on exertion and with laying flat, leg swelling, and chest pain. She was hypertensive with otherwise stable vital signs, CBC normal, BMP with bicarb of 18, otherwise unremarkable, troponin 0.19, BNP 1092, and digoxin level 0.6. ECG was paced. She appeared volume overloaded confirmed by CXR demonstrating bilateral pleural effusions and was admitted for acute CHF with lasix and cardiology consultations ordered.  Hospital Course:  Acute on chronic systolic heart failure --EF 15 to 20%-see echo report below -Acute heart failure is secondary to excessive drinking of water according to the patient's husband who will now monitor her more closely - She has been diuresed and is now euvolemic and therefore is ready for discharge -No change in Lasix at this point-lisinopril has been added  Elevated troponin - Likely due to above heart failure  Atrial  fibrillation, mechanical mitral valve, history of DVT -Continue Coumadin-INR is therapeutic at 2.8  Dementia - The patient lives with her husband who takes care of her  Discharge Exam: Vitals:   10/27/18 2351 10/28/18 0522  BP: (!) 87/63 114/67  Pulse: 61 64  Resp: 20 18  Temp: 98.4 F (36.9 C) 98.3 F (36.8 C)  SpO2: 98% 99%   Vitals:   10/27/18 1800 10/27/18 1941 10/27/18 2351 10/28/18 0522  BP: 113/82 118/76 (!) 87/63 114/67  Pulse:  77 61 64  Resp:  18 20 18   Temp:  98.2 F (36.8 C) 98.4 F (36.9 C) 98.3 F (36.8 C)  TempSrc:  Oral Oral Oral  SpO2:  94% 98% 99%  Weight:    65.1 kg  Height:        General: Pt is alert, awake, not in acute distress Cardiovascular: RRR, S1/S2 +, no rubs, no gallops Respiratory: CTA bilaterally, no wheezing, no rhonchi Abdominal: Soft, NT, ND, bowel sounds + Extremities: no edema, no cyanosis   Discharge Instructions  Discharge Instructions    Diet - low sodium heart healthy   Complete by:  As directed    Increase activity slowly   Complete by:  As directed      Allergies as of 10/28/2018   No Known Allergies     Medication List    TAKE these medications   alendronate 70 MG tablet Commonly known as:  FOSAMAX Take 70 mg by mouth once a week. Take with a full glass of water on an empty stomach.   carvedilol 3.125 MG tablet Commonly known as:  COREG Take 1 tablet (3.125 mg total) by mouth 2 (two) times daily with a meal.  cholecalciferol 1000 units tablet Commonly known as:  VITAMIN D Take 2,000 Units by mouth daily.   digoxin 0.125 MG tablet Commonly known as:  LANOXIN Take 0.125 mg by mouth daily.   furosemide 40 MG tablet Commonly known as:  LASIX TAKE ONE (1) TABLET BY MOUTH EVERY DAY. IF WEIGHT IS GREATER THAN 140LBS ON HOME SCALE, TAKE 2 TABLETS (80MG ) What changed:    how much to take  how to take this  when to take this  reasons to take this  additional instructions   lisinopril 2.5 MG  tablet Commonly known as:  PRINIVIL,ZESTRIL Take 1 tablet (2.5 mg total) by mouth daily. Start taking on:  October 29, 2018   tiZANidine 4 MG tablet Commonly known as:  ZANAFLEX Take 4 mg by mouth 2 (two) times daily.   warfarin 4 MG tablet Commonly known as:  COUMADIN Take 2 tablets (8 mg total) by mouth daily at 6 PM. What changed:  how much to take       No Known Allergies   Procedures/Studies: 2D echo-10/27/2018 1. The left ventricle has a visually estimated ejection fraction of of 15-20%. The cavity size was severely dilated. Left ventricular diastolic Doppler parameters are indeterminate due to mitral valve replacement/repair. Left ventricular diffuse  hypokinesis.  2. The right ventricle has moderately reduced systolic function. The cavity was normal. There is no increase in right ventricular wall thickness.  3. Left atrial size was severely dilated.  4. Right atrial size was severely dilated.  5. A mechanical valve is present in the mitral position. Procedure Date: 1983.  6. Bjork-Shiley mechanical mitral valve prosthesis functioning properly.  7. The tricuspid valve is normal in structure.  8. The aortic valve is normal in structure.  9. The pulmonic valve was normal in structure. Pulmonic valve regurgitation is mild by color flow Doppler. 10. The inferior vena cava was normal in size with <50% respiratory variability.  Dg Chest 2 View  Result Date: 10/26/2018 CLINICAL DATA:  Chest pain and shortness of breath. EXAM: CHEST - 2 VIEW COMPARISON:  Radiograph 07/07/2012 FINDINGS: Post median sternotomy. Right and left-sided pacemakers remain in place. Prosthetic mitral valve. Cardiomegaly is unchanged from prior exam. Aortic atherosclerosis. There are small bilateral pleural effusions with associated bibasilar opacities. Vascular congestion without overt pulmonary edema. No pneumothorax. Grossly stable osseous structures. IMPRESSION: Findings suggesting fluid overload with vascular  congestion, bilateral pleural effusions and bibasilar atelectasis/airspace opacities. Cardiomegaly which is similar to prior exam. Electronically Signed   By: Keith Rake M.D.   On: 10/26/2018 20:17     The results of significant diagnostics from this hospitalization (including imaging, microbiology, ancillary and laboratory) are listed below for reference.     Microbiology: No results found for this or any previous visit (from the past 240 hour(s)).   Labs: BNP (last 3 results) Recent Labs    10/26/18 1855  BNP 1,610.9*   Basic Metabolic Panel: Recent Labs  Lab 10/26/18 1855 10/27/18 0657 10/28/18 0353  NA 140 141 143  K 4.2 3.2* 3.5  CL 110 106 103  CO2 18* 26 29  GLUCOSE 109* 93 88  BUN 11 10 11   CREATININE 0.97 1.10* 1.18*  CALCIUM 9.3 9.1 9.5  MG  --  1.6* 1.6*   Liver Function Tests: No results for input(s): AST, ALT, ALKPHOS, BILITOT, PROT, ALBUMIN in the last 168 hours. No results for input(s): LIPASE, AMYLASE in the last 168 hours. No results for input(s): AMMONIA in the last 168  hours. CBC: Recent Labs  Lab 10/26/18 1855  WBC 3.6*  HGB 14.2  HCT 46.1*  MCV 94.9  PLT 129*   Cardiac Enzymes: Recent Labs  Lab 10/26/18 1855 10/27/18 0057 10/27/18 0657 10/27/18 1249  TROPONINI 0.19* 0.16* 0.19* 0.25*   BNP: Invalid input(s): POCBNP CBG: No results for input(s): GLUCAP in the last 168 hours. D-Dimer No results for input(s): DDIMER in the last 72 hours. Hgb A1c No results for input(s): HGBA1C in the last 72 hours. Lipid Profile No results for input(s): CHOL, HDL, LDLCALC, TRIG, CHOLHDL, LDLDIRECT in the last 72 hours. Thyroid function studies No results for input(s): TSH, T4TOTAL, T3FREE, THYROIDAB in the last 72 hours.  Invalid input(s): FREET3 Anemia work up No results for input(s): VITAMINB12, FOLATE, FERRITIN, TIBC, IRON, RETICCTPCT in the last 72 hours. Urinalysis    Component Value Date/Time   COLORURINE YELLOW 10/23/2010 2318    APPEARANCEUR CLEAR 10/23/2010 2318   LABSPEC 1.011 10/23/2010 2318   PHURINE 5.0 10/23/2010 2318   GLUCOSEU NEGATIVE 02/03/2008 1545   HGBUR NEGATIVE 10/23/2010 2318   BILIRUBINUR NEGATIVE 10/23/2010 2318   KETONESUR NEGATIVE 10/23/2010 2318   PROTEINUR NEGATIVE 10/23/2010 2318   UROBILINOGEN 0.2 10/23/2010 2318   NITRITE NEGATIVE 10/23/2010 2318   LEUKOCYTESUR  10/23/2010 2318    NEGATIVE MICROSCOPIC NOT DONE ON URINES WITH NEGATIVE PROTEIN, BLOOD, LEUKOCYTES, NITRITE, OR GLUCOSE <1000 mg/dL.   Sepsis Labs Invalid input(s): PROCALCITONIN,  WBC,  LACTICIDVEN Microbiology No results found for this or any previous visit (from the past 240 hour(s)).   Time coordinating discharge in minutes: 65  SIGNED:   Debbe Odea, MD  [facility] 10/28/2018, 11:55 AM Pager   If 7PM-7AM, please contact night-coverage [email or web address] Password [email or web address]

## 2018-10-28 NOTE — Progress Notes (Signed)
Progress Note  Patient Name: Angela [name] Date of Encounter: 10/28/2018  Primary Cardiologist: Cristopher Peru, MD   Subjective   Doing well this AM, working with occupational therapy. Denies any chest pain. Feels that breathing is much improved. Leg swelling is resolved.   Inpatient Medications    Scheduled Meds: . carvedilol  3.125 mg Oral BID WC  . digoxin  0.125 mg Oral Daily  . furosemide  40 mg Intravenous BID  . lisinopril  2.5 mg Oral Daily  . potassium chloride  40 mEq Oral BID  . sodium chloride flush  3 mL Intravenous Once  . tiZANidine  4 mg Oral BID  . warfarin  6 mg Oral q1800  . Warfarin - Pharmacist Dosing Inpatient   Does not apply q1800   Continuous Infusions:  PRN Meds: acetaminophen **OR** acetaminophen   Vital Signs    Vitals:   10/27/18 1800 10/27/18 1941 10/27/18 2351 10/28/18 0522  BP: 113/82 118/76 (!) 87/63 114/67  Pulse:  77 61 64  Resp:  18 20 18   Temp:  98.2 F (36.8 C) 98.4 F (36.9 C) 98.3 F (36.8 C)  TempSrc:  Oral Oral Oral  SpO2:  94% 98% 99%  Weight:    65.1 kg  Height:        Intake/Output Summary (Last 24 hours) at 10/28/2018 1034 Last data filed at 10/27/2018 2352 Gross per 24 hour  Intake 120 ml  Output 2500 ml  Net -2380 ml   Last 3 Weights 10/28/2018 10/27/2018 10/26/2018  Weight (lbs) 143 lb 8 oz 137 lb 1.6 oz 144 lb 1.6 oz  Weight (kg) 65.091 kg 62.188 kg 65.363 kg      Telemetry     afib with pacing- Personally Reviewed  ECG    afib with PVCs and intermittent V pacing. - Personally Reviewed  Physical Exam   GEN: No acute distress.   Neck: No JVD sitting upright in chair Cardiac: distant heart sounds, irr irr S1 and S2 with crisp click, no murmurs, rubs, or gallops.  Respiratory: Clear to auscultation bilaterally. GI: Soft, nontender, non-distended  MS: No edema; No deformity. Neuro:  Nonfocal  Psych: Normal affect   Labs    Chemistry Recent Labs  Lab 10/26/18 1855 10/27/18 0657 10/28/18 0353  NA 140  141 143  K 4.2 3.2* 3.5  CL 110 106 103  CO2 18* 26 29  GLUCOSE 109* 93 88  BUN 11 10 11   CREATININE 0.97 1.10* 1.18*  CALCIUM 9.3 9.1 9.5  GFRNONAA 58* 49* 45*  GFRAA >60 57* 53*  ANIONGAP 12 9 11      Hematology Recent Labs  Lab 10/26/18 1855  WBC 3.6*  RBC 4.86  HGB 14.2  HCT 46.1*  MCV 94.9  MCH 29.2  MCHC 30.8  RDW 15.0  PLT 129*    Cardiac Enzymes Recent Labs  Lab 10/26/18 1855 10/27/18 0057 10/27/18 0657 10/27/18 1249  TROPONINI 0.19* 0.16* 0.19* 0.25*    Recent Labs  Lab 10/26/18 1907  TROPIPOC 0.11*     BNP Recent Labs  Lab 10/26/18 1855  BNP 1,092.7*     DDimer No results for input(s): DDIMER in the last 168 hours.   Radiology    Dg Chest 2 View  Result Date: 10/26/2018 CLINICAL DATA:  Chest pain and shortness of breath. EXAM: CHEST - 2 VIEW COMPARISON:  Radiograph 07/07/2012 FINDINGS: Post median sternotomy. Right and left-sided pacemakers remain in place. Prosthetic mitral valve. Cardiomegaly is unchanged from prior  exam. Aortic atherosclerosis. There are small bilateral pleural effusions with associated bibasilar opacities. Vascular congestion without overt pulmonary edema. No pneumothorax. Grossly stable osseous structures. IMPRESSION: Findings suggesting fluid overload with vascular congestion, bilateral pleural effusions and bibasilar atelectasis/airspace opacities. Cardiomegaly which is similar to prior exam. Electronically Signed   By: Keith Rake M.D.   On: 10/26/2018 20:17    Cardiac Studies   Echo 10/27/18  1. The left ventricle has a visually estimated ejection fraction of of 15-20%. The cavity size was severely dilated. Left ventricular diastolic Doppler parameters are indeterminate due to mitral valve replacement/repair. Left ventricular diffuse  hypokinesis.  2. The right ventricle has moderately reduced systolic function. The cavity was normal. There is no increase in right ventricular wall thickness.  3. Left atrial size was  severely dilated.  4. Right atrial size was severely dilated.  5. A mechanical valve is present in the mitral position. Procedure Date: 1983.  6. Bjork-Shiley mechanical mitral valve prosthesis functioning properly.  7. The tricuspid valve is normal in structure.  8. The aortic valve is normal in structure.  9. The pulmonic valve was normal in structure. Pulmonic valve regurgitation is mild by color flow Doppler. 10. The inferior vena cava was normal in size with <50% respiratory variability.  Patient Profile     73 y.o. female with PMH mechanical MVR in 1983, atrial fibrillation on coumadin (for valve as well), nonischemic cardiomyopathy (EF 15-20%) with BiV-ICD with history of ventricular fibrillation who was admitted with acute on chronic systolic and diastolic heart failure, likely due to dietary indiscretion.  Assessment & Plan    Acute on chronic systolic and diastolic heart failure, NICM: -2.8 L, though weights are somewhat inaccurate. JVD, LE edema resolved. Appears euvolemic today -changing to oral lasix today, home dose of 40 mg. Would make sure she makes urine on this dose, otherwise would increase to 80 mg oral daily. -continue carvedilol, digoxin, lisinopril -continue coumadin per pharmacy -she is ok for discharge from a cardiac perspective.  Mechanical mitral valve: gradient is normal. On coumadin, followed by her primary care  atrial fibrillation: stable, no changes.  [facility] will sign off.   Medication Recommendations:  Restart home medications as noted Other recommendations (labs, testing, etc):  none Follow up as an outpatient:  Will need cardiology follow up post discharge, we will arrange. Patient's INR is followed by her primary care, this will need to be arranged for her follow up.  For questions or updates, please contact [place] Please consult [email or web address] for contact info under        Signed, Buford Dresser, MD  10/28/2018, 10:34 AM

## 2018-10-28 NOTE — Progress Notes (Signed)
Occupational Therapy Treatment Patient Details Name: Angela [name] MRN: [id number] DOB: 1946-08-10 Today's Date: 10/28/2018    History of present illness Pt is a 73 y.o. female admitted 10/26/18 with SOB and chest pain. Worked up for CHF exacerbation; elevated troponin likely secondary to this. PMH includes dementia, gait abnormality, OA, afib on warfarin, AICD placement, DVT, colon CA, CHF.   OT comments  Pt is currently making progress towards OT goals this session. Pt awake in chair and eager to work with therapy. Pt able to ambulate and perform transfers today at min guard with her own personal cane. She requested sitting for oral care and face washing and was able to complete with min A (verbal cues) in seated position. Pt continues to benefit from skilled OT in the acute setting while admitted.    Follow Up Recommendations  No OT follow up;Supervision/Assistance - 24 hour    Equipment Recommendations  Tub/shower seat    Recommendations for Other Services      Precautions / Restrictions Precautions Precautions: Fall Restrictions Weight Bearing Restrictions: No       Mobility Bed Mobility               General bed mobility comments: OOB in recliner at beginning and end of session  Transfers Overall transfer level: Needs assistance Equipment used: Quad cane Transfers: Sit to/from Stand Sit to Stand: Min guard         General transfer comment: for general safety, increased time and effort to rise to standing    Balance Overall balance assessment: Needs assistance   Sitting balance-Leahy Scale: Good     Standing balance support: During functional activity;Single extremity supported Standing balance-Leahy Scale: Fair                             ADL either performed or assessed with clinical judgement   ADL Overall ADL's : Needs assistance/impaired     Grooming: Minimal assistance;Wash/dry face;Oral care;Sitting Grooming Details (indicate cue  type and reason): Pt requested to sit for grooming tasks, cues for sequencing, perseverated on apoligizing for having dentures, able to complete fine motor and gross motor tasks without physical assistance                 Toilet Transfer: Min guard;Ambulation(with cane) Toilet Transfer Details (indicate cue type and reason): using quad cane, vc for safe hand placement         Functional mobility during ADLs: Min guard;Cane General ADL Comments: dementia is a factor- able to follow simple one step commands     Vision       Perception     Praxis      Cognition Arousal/Alertness: Awake/alert Behavior During Therapy: WFL for tasks assessed/performed Overall Cognitive Status: History of cognitive impairments - at baseline                                 General Comments: per chart hx of dementia, pt pleasantly confused this session, follows one step commands consistently throughout session        Exercises     Shoulder Instructions       General Comments      Pertinent Vitals/ Pain       Pain Assessment: No/denies pain  Home Living  Prior Functioning/Environment              Frequency  Min 2X/week        Progress Toward Goals  OT Goals(current goals can now be found in the care plan section)  Progress towards OT goals: Progressing toward goals  Acute Rehab OT Goals Patient Stated Goal: Return home OT Goal Formulation: With patient Time For Goal Achievement: 11/10/18 Potential to Achieve Goals: Good  Plan Discharge plan remains appropriate;Frequency remains appropriate    Co-evaluation                 AM-PAC OT "6 Clicks" Daily Activity     Outcome Measure   Help from another person eating meals?: None Help from another person taking care of personal grooming?: A Little Help from another person toileting, which includes using toliet, bedpan, or urinal?: A  Little Help from another person bathing (including washing, rinsing, drying)?: A Little Help from another person to put on and taking off regular upper body clothing?: None Help from another person to put on and taking off regular lower body clothing?: A Lot 6 Click Score: 19    End of Session Equipment Utilized During Treatment: Other (comment);Gait belt(cane)  OT Visit Diagnosis: Muscle weakness (generalized) (M62.81);Unsteadiness on feet (R26.81)   Activity Tolerance Patient tolerated treatment well   Patient Left with call bell/phone within reach;in chair(cardiologist present)   Nurse Communication Mobility status;Other (comment)(chair alarm malfunctioning  - so not on)        Time: 6812-7517 OT Time Calculation (min): 26 min  Charges: OT General Charges $OT Visit: 1 Visit OT Treatments $Self Care/Home Management : 23-37 mins  Hulda Humphrey OTR/L Acute Rehabilitation Services Pager: [phone] Office: [place] 10/28/2018, 10:18 AM

## 2018-10-28 NOTE — Progress Notes (Signed)
CM talked to patient again, she does not want DME at discharge; Aneta Mins [phone]

## 2018-11-02 ENCOUNTER — Ambulatory Visit (INDEPENDENT_AMBULATORY_CARE_PROVIDER_SITE_OTHER): Payer: Medicare HMO | Admitting: *Deleted

## 2018-11-02 DIAGNOSIS — I4901 Ventricular fibrillation: Secondary | ICD-10-CM

## 2018-11-02 DIAGNOSIS — I428 Other cardiomyopathies: Secondary | ICD-10-CM

## 2018-11-03 LAB — CUP PACEART REMOTE DEVICE CHECK
Battery Remaining Longevity: 43 mo
Battery Remaining Percentage: 48 %
Battery Voltage: 2.96 V
Date Time Interrogation Session: 20200309134432
HighPow Impedance: 45 Ohm
HighPow Impedance: 51 Ohm
Implantable Lead Implant Date: 20090618
Implantable Lead Implant Date: 20090618
Implantable Lead Location: 753858
Implantable Lead Location: 753860
Implantable Lead Model: 7120
Implantable Pulse Generator Implant Date: 20160111
Lead Channel Impedance Value: 350 Ohm
Lead Channel Impedance Value: 530 Ohm
Lead Channel Pacing Threshold Amplitude: 0.375 V
Lead Channel Pacing Threshold Amplitude: 0.75 V
Lead Channel Pacing Threshold Pulse Width: 0.5 ms
Lead Channel Pacing Threshold Pulse Width: 0.5 ms
Lead Channel Sensing Intrinsic Amplitude: 12 mV
Lead Channel Setting Pacing Amplitude: 2 V
Lead Channel Setting Pacing Amplitude: 2 V
Lead Channel Setting Pacing Pulse Width: 0.5 ms
Lead Channel Setting Pacing Pulse Width: 0.5 ms
Lead Channel Setting Sensing Sensitivity: 0.5 mV
Pulse Gen Serial Number: 7219979

## 2018-11-05 ENCOUNTER — Ambulatory Visit (INDEPENDENT_AMBULATORY_CARE_PROVIDER_SITE_OTHER): Payer: Medicare HMO | Admitting: Internal Medicine

## 2018-11-05 ENCOUNTER — Other Ambulatory Visit: Payer: Self-pay

## 2018-11-05 ENCOUNTER — Encounter: Payer: Self-pay | Admitting: Internal Medicine

## 2018-11-05 VITALS — BP 98/60 | HR 77 | Ht 66.0 in | Wt 143.2 lb

## 2018-11-05 DIAGNOSIS — I5022 Chronic systolic (congestive) heart failure: Secondary | ICD-10-CM | POA: Diagnosis not present

## 2018-11-05 DIAGNOSIS — I4901 Ventricular fibrillation: Secondary | ICD-10-CM

## 2018-11-05 NOTE — Patient Instructions (Signed)
Medication Instructions:  No change If you need a refill on your cardiac medications before your next appointment, please call your pharmacy.   Lab work: none If you have labs (blood work) drawn today and your tests are completely normal, you will receive your results only by: [name] MyChart Message (if you have MyChart) OR . A paper copy in the mail If you have any lab test that is abnormal or we need to change your treatment, we will call you to review the results.  Testing/Procedures: none  Follow-Up: At [facility], you and your health needs are our priority.  As part of our continuing mission to provide you with exceptional heart care, we have created designated Provider Care Teams.  These Care Teams include your primary Cardiologist (physician) and Advanced Practice Providers (APPs -  Physician Assistants and Nurse Practitioners) who all work together to provide you with the care you need, when you need it. You will need a follow up appointment in 12 months.  Please call our office 2 months in advance to schedule this appointment.  You may see Dr. Lovena Le or one of the following Advanced Practice Providers on your designated Care Team:   Chanetta Marshall, NP . Tommye Standard, PA-C  Any Other Special Instructions Will Be Listed Below (If Applicable).

## 2018-11-05 NOTE — Progress Notes (Signed)
HPI Angela [name] returns today for followup. She is a very pleasant 73 year old woman with a non-ischemic cardiomyopathy, chronic systolic heart failure, MVR, left bundle branch block, ventricular fibrillation cardiac arrest, status post resuscitation, status post biventricular ICD implantation.In the interim, she has been in the hospital with CHF. She is improved and is trying to avoid too much salt in her diet. She has weakness.  No Known Allergies   Current Outpatient Medications  Medication Sig Dispense Refill  . alendronate (FOSAMAX) 70 MG tablet Take 70 mg by mouth once a week. Take with a full glass of water on an empty stomach.    . carvedilol (COREG) 3.125 MG tablet Take 1 tablet (3.125 mg total) by mouth 2 (two) times daily with a meal. 60 tablet 12  . cholecalciferol (VITAMIN D) 1000 UNITS tablet Take 2,000 Units by mouth daily.    . digoxin (LANOXIN) 0.125 MG tablet Take 0.125 mg by mouth daily.    . furosemide (LASIX) 40 MG tablet TAKE ONE (1) TABLET BY MOUTH EVERY DAY. IF WEIGHT IS GREATER THAN 140LBS ON HOME SCALE, TAKE 2 TABLETS (80MG ) (Patient taking differently: Take 20 mg by mouth daily as needed for fluid. ) 60 tablet 3  . lisinopril (PRINIVIL,ZESTRIL) 2.5 MG tablet Take 1 tablet (2.5 mg total) by mouth daily. 30 tablet 0  . tiZANidine (ZANAFLEX) 4 MG tablet Take 4 mg by mouth 2 (two) times daily.     [name] warfarin (COUMADIN) 4 MG tablet Take 2 tablets (8 mg total) by mouth daily at 6 PM. (Patient taking differently: Take 6 mg by mouth daily at 6 PM. ) 28 tablet 0   No current facility-administered medications for this visit.      Past Medical History:  Diagnosis Date  . Abnormality of gait 08/23/2014  . Arthritis   . Atrial fibrillation ([place])    A.  Chronic Coumadin  . Cardiac arrest - ventricular fibrillation    A.  12/2007;  B. 01/2008 St. Jude Promote Bi-V ICD placed  . CVA (cerebral vascular accident) ([place])   . Diverticular disease   . DVT (deep venous  thrombosis) ([place])   . 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 History of colon cancer    A.  1999 - T3, N1  chemotherapy  . Internal hemorrhoids   . Memory deficit 08/23/2014  . Nonischemic cardiomyopathy ([place])    a.  11/2001 - Cath - NL Cors; b. ECHO 09/22/11: EF 20%, MVR normal, moderate LAE, mild RAE c. ECHO (08/2012): ED 20%, diff HK, LA mod dilated, mild/mod TR, RV mild/mod decreased sys fx  . Rectal bleeding 04/28/2017  . Rheumatic heart disease    A. 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 Sick sinus syndrome ([place])    A.  s/p pacer in 1995.  B.    . Systolic CHF, chronic ([place])    A.  01/2008 Echo - EF 10-20%    ROS:   All systems reviewed and negative except as noted in the HPI.   Past Surgical History:  Procedure Laterality Date  . ABDOMINAL HYSTERECTOMY    . AV NODE ABLATION N/A 09/23/2011   Procedure: AV NODE ABLATION;  Surgeon: Evans Lance, MD;  Location: [facility] CATH LAB;  Service: Cardiovascular;  Laterality: N/A;  . BIV ICD GENERTAOR CHANGE OUT N/A 09/05/2014   Procedure: BIV ICD GENERTAOR CHANGE OUT;  Surgeon: Carleene Overlie  Peyton Najjar, MD;  Location: [facility] CATH LAB;  Service: Cardiovascular;  Laterality: N/A;  . Biventricular AICD    . COLON SURGERY  1999  . History of echocardiogram  2003, 2007, 2009  . Mitral Valve Replacement, Bjork-Shiley valve  1983     Family History  Problem Relation Age of Onset  . Diabetes Mother 56  . Coronary artery disease Mother        s/p cabg  . Lung cancer Father 13       smoker  . Coronary artery disease Sister        s/p cabg     Social History   Socioeconomic History  . Marital status: Married    Spouse name: Not on file  . Number of children: 1  . Years of education: Not on file  . Highest education level: Not on file  Occupational History  . Occupation: Retired    Fish farm manager: RETIRED  Social Needs  . Financial resource strain: Not on file  . Food insecurity:    Worry: Not  on file    Inability: Not on file  . Transportation needs:    Medical: Not on file    Non-medical: Not on file  Tobacco Use  . Smoking status: Never Smoker  . Smokeless tobacco: Never Used  Substance and Sexual Activity  . Alcohol use: No  . Drug use: No  . Sexual activity: Never  Lifestyle  . Physical activity:    Days per week: Not on file    Minutes per session: Not on file  . Stress: Not on file  Relationships  . Social connections:    Talks on phone: Not on file    Gets together: Not on file    Attends religious service: Not on file    Active member of club or organization: Not on file    Attends meetings of clubs or organizations: Not on file    Relationship status: Not on file  . Intimate partner violence:    Fear of current or ex partner: Not on file    Emotionally abused: Not on file    Physically abused: Not on file    Forced sexual activity: Not on file  Other Topics Concern  . Not on file  Social History Narrative   The patient is married and has 1 child.  Lives in [place] with husband.  She is retired from working at [employer].  She does not smoke or drink     BP 98/60   Pulse 77   Ht 5\' 6"  (1.676 m)   Wt 143 lb 3.2 oz (65 kg)   SpO2 99%   BMI 23.11 kg/m   Physical Exam:  Well appearing NAD HEENT: Unremarkable Neck:  No JVD, no thyromegally Lymphatics:  No adenopathy Back:  No CVA tenderness Lungs:  Clear with no wheezes HEART:  Regular rate rhythm, no murmurs, no rubs, no clicks, Mechanical MV closure. Abd:  soft, positive bowel sounds, no organomegally, no rebound, no guarding Ext:  2 plus pulses, no edema, no cyanosis, no clubbing Skin:  No rashes no nodules Neuro:  CN II through XII intact, motor grossly intact  DEVICE  Normal device function.  See PaceArt for details.   Assess/Plan: 1. Atrial fib - her VR is well controlled.  2. Acute on chronic systolic heart failure - her symptoms are class 2. She is trying to avoid sodium. No  problems with her meds. 3. ICD - her St. Jude  device is working normally. 4. VT - she has had several non-sustained episodes but none requiring device therapy.  Mikle Bosworth.D.

## 2018-11-10 LAB — CUP PACEART INCLINIC DEVICE CHECK
Battery Remaining Longevity: 43 mo
Brady Statistic RA Percent Paced: 0 %
Brady Statistic RV Percent Paced: 84 %
Date Time Interrogation Session: 20200312145453
HighPow Impedance: 52.0015
Implantable Lead Implant Date: 20090618
Implantable Lead Implant Date: 20090618
Implantable Lead Location: 753858
Implantable Lead Location: 753860
Implantable Lead Model: 7120
Implantable Pulse Generator Implant Date: 20160111
Lead Channel Impedance Value: 350 Ohm
Lead Channel Impedance Value: 512.5 Ohm
Lead Channel Pacing Threshold Amplitude: 0.375 V
Lead Channel Pacing Threshold Amplitude: 0.5 V
Lead Channel Pacing Threshold Amplitude: 0.5 V
Lead Channel Pacing Threshold Pulse Width: 0.5 ms
Lead Channel Pacing Threshold Pulse Width: 0.5 ms
Lead Channel Pacing Threshold Pulse Width: 0.5 ms
Lead Channel Sensing Intrinsic Amplitude: 11.8 mV
Lead Channel Setting Pacing Amplitude: 2 V
Lead Channel Setting Pacing Amplitude: 2 V
Lead Channel Setting Pacing Pulse Width: 0.5 ms
Lead Channel Setting Pacing Pulse Width: 0.5 ms
Lead Channel Setting Sensing Sensitivity: 0.5 mV
Pulse Gen Serial Number: 7219979

## 2018-11-10 NOTE — Progress Notes (Signed)
Remote ICD transmission.   

## 2018-11-26 ENCOUNTER — Emergency Department (HOSPITAL_COMMUNITY): Payer: Medicare HMO

## 2018-11-26 ENCOUNTER — Encounter (HOSPITAL_COMMUNITY): Payer: Self-pay | Admitting: Emergency Medicine

## 2018-11-26 ENCOUNTER — Inpatient Hospital Stay (HOSPITAL_COMMUNITY)
Admission: EM | Admit: 2018-11-26 | Discharge: 2018-12-08 | DRG: 264 | Disposition: A | Discharge status: Home Health | Payer: Medicare HMO | Source: Ambulatory Visit | Attending: Internal Medicine | Admitting: Internal Medicine

## 2018-11-26 DIAGNOSIS — R55 Syncope and collapse: Secondary | ICD-10-CM

## 2018-11-26 DIAGNOSIS — Z7901 Long term (current) use of anticoagulants: Secondary | ICD-10-CM

## 2018-11-26 DIAGNOSIS — B372 Candidiasis of skin and nail: Secondary | ICD-10-CM | POA: Diagnosis present

## 2018-11-26 DIAGNOSIS — I4821 Permanent atrial fibrillation: Secondary | ICD-10-CM | POA: Diagnosis present

## 2018-11-26 DIAGNOSIS — F0150 Vascular dementia without behavioral disturbance: Secondary | ICD-10-CM | POA: Diagnosis not present

## 2018-11-26 DIAGNOSIS — R1310 Dysphagia, unspecified: Secondary | ICD-10-CM | POA: Diagnosis present

## 2018-11-26 DIAGNOSIS — I4891 Unspecified atrial fibrillation: Secondary | ICD-10-CM

## 2018-11-26 DIAGNOSIS — N7689 Other specified inflammation of vagina and vulva: Secondary | ICD-10-CM | POA: Diagnosis present

## 2018-11-26 DIAGNOSIS — F0390 Unspecified dementia without behavioral disturbance: Secondary | ICD-10-CM | POA: Diagnosis present

## 2018-11-26 DIAGNOSIS — I5022 Chronic systolic (congestive) heart failure: Secondary | ICD-10-CM | POA: Diagnosis present

## 2018-11-26 DIAGNOSIS — Z8249 Family history of ischemic heart disease and other diseases of the circulatory system: Secondary | ICD-10-CM

## 2018-11-26 DIAGNOSIS — I428 Other cardiomyopathies: Secondary | ICD-10-CM | POA: Diagnosis present

## 2018-11-26 DIAGNOSIS — Z86718 Personal history of other venous thrombosis and embolism: Secondary | ICD-10-CM

## 2018-11-26 DIAGNOSIS — Z7983 Long term (current) use of bisphosphonates: Secondary | ICD-10-CM

## 2018-11-26 DIAGNOSIS — R7989 Other specified abnormal findings of blood chemistry: Secondary | ICD-10-CM

## 2018-11-26 DIAGNOSIS — I472 Ventricular tachycardia, unspecified: Secondary | ICD-10-CM

## 2018-11-26 DIAGNOSIS — R339 Retention of urine, unspecified: Secondary | ICD-10-CM | POA: Diagnosis not present

## 2018-11-26 DIAGNOSIS — Z801 Family history of malignant neoplasm of trachea, bronchus and lung: Secondary | ICD-10-CM

## 2018-11-26 DIAGNOSIS — Z8674 Personal history of sudden cardiac arrest: Secondary | ICD-10-CM | POA: Diagnosis not present

## 2018-11-26 DIAGNOSIS — I255 Ischemic cardiomyopathy: Secondary | ICD-10-CM | POA: Diagnosis present

## 2018-11-26 DIAGNOSIS — B9689 Other specified bacterial agents as the cause of diseases classified elsewhere: Secondary | ICD-10-CM | POA: Diagnosis present

## 2018-11-26 DIAGNOSIS — R110 Nausea: Secondary | ICD-10-CM

## 2018-11-26 DIAGNOSIS — I495 Sick sinus syndrome: Secondary | ICD-10-CM | POA: Diagnosis present

## 2018-11-26 DIAGNOSIS — Z8719 Personal history of other diseases of the digestive system: Secondary | ICD-10-CM

## 2018-11-26 DIAGNOSIS — Z95810 Presence of automatic (implantable) cardiac defibrillator: Secondary | ICD-10-CM | POA: Diagnosis not present

## 2018-11-26 DIAGNOSIS — R778 Other specified abnormalities of plasma proteins: Secondary | ICD-10-CM | POA: Diagnosis present

## 2018-11-26 DIAGNOSIS — R627 Adult failure to thrive: Secondary | ICD-10-CM | POA: Diagnosis present

## 2018-11-26 DIAGNOSIS — I4901 Ventricular fibrillation: Secondary | ICD-10-CM | POA: Diagnosis present

## 2018-11-26 DIAGNOSIS — R197 Diarrhea, unspecified: Secondary | ICD-10-CM | POA: Diagnosis not present

## 2018-11-26 DIAGNOSIS — B9620 Unspecified Escherichia coli [E. coli] as the cause of diseases classified elsewhere: Secondary | ICD-10-CM | POA: Diagnosis present

## 2018-11-26 DIAGNOSIS — Z7401 Bed confinement status: Secondary | ICD-10-CM

## 2018-11-26 DIAGNOSIS — E43 Unspecified severe protein-calorie malnutrition: Secondary | ICD-10-CM | POA: Diagnosis present

## 2018-11-26 DIAGNOSIS — Z85038 Personal history of other malignant neoplasm of large intestine: Secondary | ICD-10-CM

## 2018-11-26 DIAGNOSIS — I739 Peripheral vascular disease, unspecified: Secondary | ICD-10-CM | POA: Diagnosis present

## 2018-11-26 DIAGNOSIS — Z79899 Other long term (current) drug therapy: Secondary | ICD-10-CM

## 2018-11-26 DIAGNOSIS — L02215 Cutaneous abscess of perineum: Secondary | ICD-10-CM | POA: Diagnosis present

## 2018-11-26 DIAGNOSIS — Z7189 Other specified counseling: Secondary | ICD-10-CM

## 2018-11-26 DIAGNOSIS — Z8673 Personal history of transient ischemic attack (TIA), and cerebral infarction without residual deficits: Secondary | ICD-10-CM

## 2018-11-26 DIAGNOSIS — Z833 Family history of diabetes mellitus: Secondary | ICD-10-CM

## 2018-11-26 DIAGNOSIS — N7682 Fournier disease of vagina and vulva: Secondary | ICD-10-CM | POA: Diagnosis present

## 2018-11-26 DIAGNOSIS — Z952 Presence of prosthetic heart valve: Secondary | ICD-10-CM | POA: Diagnosis not present

## 2018-11-26 DIAGNOSIS — L0231 Cutaneous abscess of buttock: Secondary | ICD-10-CM | POA: Diagnosis present

## 2018-11-26 DIAGNOSIS — I5042 Chronic combined systolic (congestive) and diastolic (congestive) heart failure: Secondary | ICD-10-CM | POA: Diagnosis present

## 2018-11-26 DIAGNOSIS — I447 Left bundle-branch block, unspecified: Secondary | ICD-10-CM | POA: Diagnosis present

## 2018-11-26 DIAGNOSIS — R791 Abnormal coagulation profile: Secondary | ICD-10-CM | POA: Diagnosis present

## 2018-11-26 DIAGNOSIS — Z6821 Body mass index (BMI) 21.0-21.9, adult: Secondary | ICD-10-CM

## 2018-11-26 DIAGNOSIS — Z515 Encounter for palliative care: Secondary | ICD-10-CM | POA: Diagnosis not present

## 2018-11-26 DIAGNOSIS — K611 Rectal abscess: Secondary | ICD-10-CM | POA: Diagnosis present

## 2018-11-26 LAB — COMPREHENSIVE METABOLIC PANEL
ALT: 44 U/L (ref 0–44)
AST: 41 U/L (ref 15–41)
Albumin: 2.7 g/dL — ABNORMAL LOW (ref 3.5–5.0)
Alkaline Phosphatase: 76 U/L (ref 38–126)
Anion gap: 15 (ref 5–15)
BUN: 17 mg/dL (ref 8–23)
CO2: 20 mmol/L — ABNORMAL LOW (ref 22–32)
Calcium: 8.8 mg/dL — ABNORMAL LOW (ref 8.9–10.3)
Chloride: 99 mmol/L (ref 98–111)
Creatinine, Ser: 0.97 mg/dL (ref 0.44–1.00)
GFR calc Af Amer: >60 mL/min (ref >60)
GFR calc non Af Amer: 58 mL/min — ABNORMAL LOW (ref >60)
Glucose, Bld: 181 mg/dL — ABNORMAL HIGH (ref 70–99)
Potassium: 4.1 mmol/L (ref 3.5–5.1)
Sodium: 134 mmol/L — ABNORMAL LOW (ref 135–145)
Total Bilirubin: 1.9 mg/dL — ABNORMAL HIGH (ref 0.3–1.2)
Total Protein: 7 g/dL (ref 6.5–8.1)

## 2018-11-26 LAB — GLUCOSE, CAPILLARY: Glucose-Capillary: 134 mg/dL — ABNORMAL HIGH (ref 70–99)

## 2018-11-26 LAB — CBC WITH DIFFERENTIAL/PLATELET
Abs Immature Granulocytes: 0.4 10*3/uL — ABNORMAL HIGH (ref 0.00–0.07)
Basophils Absolute: 0 10*3/uL (ref 0.0–0.1)
Basophils Relative: 0 %
Eosinophils Absolute: 0 10*3/uL (ref 0.0–0.5)
Eosinophils Relative: 0 %
HCT: 47.5 % — ABNORMAL HIGH (ref 36.0–46.0)
Hemoglobin: 15.3 g/dL — ABNORMAL HIGH (ref 12.0–15.0)
Immature Granulocytes: 2 %
Lymphocytes Relative: 3 %
Lymphs Abs: 0.5 10*3/uL — ABNORMAL LOW (ref 0.7–4.0)
MCH: 28.4 pg (ref 26.0–34.0)
MCHC: 32.2 g/dL (ref 30.0–36.0)
MCV: 88.3 fL (ref 80.0–100.0)
Monocytes Absolute: 0.7 10*3/uL (ref 0.1–1.0)
Monocytes Relative: 4 %
Neutro Abs: 18.5 10*3/uL — ABNORMAL HIGH (ref 1.7–7.7)
Neutrophils Relative %: 91 %
Platelets: 299 10*3/uL (ref 150–400)
RBC: 5.38 MIL/uL — ABNORMAL HIGH (ref 3.87–5.11)
RDW: 15 % (ref 11.5–15.5)
WBC: 20.2 10*3/uL — ABNORMAL HIGH (ref 4.0–10.5)
nRBC: 0 % (ref 0.0–0.2)

## 2018-11-26 LAB — URINALYSIS, ROUTINE W REFLEX MICROSCOPIC
Bilirubin Urine: NEGATIVE
Glucose, UA: NEGATIVE mg/dL
Hgb urine dipstick: NEGATIVE
Ketones, ur: NEGATIVE mg/dL
Leukocytes,Ua: NEGATIVE
Nitrite: NEGATIVE
Protein, ur: NEGATIVE mg/dL
Specific Gravity, Urine: 1.023 (ref 1.005–1.030)
pH: 5 (ref 5.0–8.0)

## 2018-11-26 LAB — CBG MONITORING, ED: Glucose-Capillary: 172 mg/dL — ABNORMAL HIGH (ref 70–99)

## 2018-11-26 LAB — DIGOXIN LEVEL: Digoxin Level: 0.4 ng/mL — ABNORMAL LOW (ref 0.8–2.0)

## 2018-11-26 LAB — TROPONIN I: Troponin I: 0.16 ng/mL (ref <0.03)

## 2018-11-26 LAB — MAGNESIUM: Magnesium: 1.8 mg/dL (ref 1.7–2.4)

## 2018-11-26 MED ORDER — SODIUM CHLORIDE 0.9 % IV SOLN
INTRAVENOUS | Status: AC
  Administered 2018-11-26 – 2018-11-27 (×3): via INTRAVENOUS

## 2018-11-26 MED ORDER — ACETAMINOPHEN 325 MG PO TABS
650.0000 mg | ORAL_TABLET | Freq: Four times a day (QID) | ORAL | Status: DC | PRN
  Administered 2018-11-29 – 2018-12-06 (×4): 650 mg via ORAL
  Filled 2018-11-26 (×4): qty 2

## 2018-11-26 MED ORDER — SODIUM CHLORIDE 0.9% IV SOLUTION
Freq: Once | INTRAVENOUS | Status: AC
  Administered 2018-11-27: via INTRAVENOUS

## 2018-11-26 MED ORDER — AMIODARONE HCL IN DEXTROSE 360-4.14 MG/200ML-% IV SOLN
60.0000 mg/h | INTRAVENOUS | Status: DC
  Administered 2018-11-26 (×2): 60 mg/h via INTRAVENOUS
  Filled 2018-11-26 (×2): qty 200

## 2018-11-26 MED ORDER — VANCOMYCIN HCL 1000 MG IV SOLR
1000.0000 mg | INTRAVENOUS | Status: DC
  Administered 2018-11-27 – 2018-11-29 (×3): 1000 mg via INTRAVENOUS
  Filled 2018-11-26 (×5): qty 1000

## 2018-11-26 MED ORDER — SODIUM CHLORIDE 0.9 % IV BOLUS
1000.0000 mL | Freq: Once | INTRAVENOUS | Status: AC
  Administered 2018-11-26: 1000 mL via INTRAVENOUS

## 2018-11-26 MED ORDER — ACETAMINOPHEN 650 MG RE SUPP: 650.0000 mg | Freq: Four times a day (QID) | RECTAL | Status: DC | PRN

## 2018-11-26 MED ORDER — AMIODARONE HCL IN DEXTROSE 360-4.14 MG/200ML-% IV SOLN
30.0000 mg/h | INTRAVENOUS | Status: DC
  Administered 2018-11-27: 30 mg/h via INTRAVENOUS
  Administered 2018-11-27: 60 mg/h via INTRAVENOUS
  Administered 2018-11-27: 30 mg/h via INTRAVENOUS
  Administered 2018-11-28 – 2018-11-29 (×8): 60 mg/h via INTRAVENOUS
  Administered 2018-11-30 (×2): 30 mg/h via INTRAVENOUS
  Administered 2018-11-30: 60 mg/h via INTRAVENOUS
  Filled 2018-11-26 (×17): qty 200

## 2018-11-26 MED ORDER — VITAMIN K1 10 MG/ML IJ SOLN
10.0000 mg | Freq: Once | INTRAVENOUS | Status: AC
  Administered 2018-11-27: 10 mg via INTRAVENOUS
  Filled 2018-11-26: qty 1

## 2018-11-26 MED ORDER — VANCOMYCIN HCL 10 G IV SOLR
1250.0000 mg | INTRAVENOUS | Status: AC
  Administered 2018-11-27: 02:00:00 1250 mg via INTRAVENOUS
  Filled 2018-11-26: qty 1250

## 2018-11-26 MED ORDER — MAGNESIUM SULFATE 2 GM/50ML IV SOLN
2.0000 g | Freq: Once | INTRAVENOUS | Status: AC
  Administered 2018-11-26: 2 g via INTRAVENOUS
  Filled 2018-11-26: qty 50

## 2018-11-26 MED ORDER — AMIODARONE LOAD VIA INFUSION
150.0000 mg | Freq: Once | INTRAVENOUS | Status: AC
  Administered 2018-11-26: 150 mg via INTRAVENOUS

## 2018-11-26 MED ORDER — SODIUM CHLORIDE 0.9% FLUSH
3.0000 mL | Freq: Two times a day (BID) | INTRAVENOUS | Status: DC
  Administered 2018-11-26 – 2018-12-07 (×14): 3 mL via INTRAVENOUS

## 2018-11-26 MED ORDER — PIPERACILLIN-TAZOBACTAM 3.375 G IVPB 30 MIN
3.3750 g | Freq: Once | INTRAVENOUS | Status: AC
  Administered 2018-11-26: 3.375 g via INTRAVENOUS
  Filled 2018-11-26: qty 50

## 2018-11-26 MED ORDER — IOHEXOL 300 MG/ML  SOLN
100.0000 mL | Freq: Once | INTRAMUSCULAR | Status: AC | PRN
  Administered 2018-11-26: 100 mL via INTRAVENOUS

## 2018-11-26 MED ORDER — PIPERACILLIN-TAZOBACTAM 3.375 G IVPB
3.3750 g | Freq: Three times a day (TID) | INTRAVENOUS | Status: DC
  Administered 2018-11-27 – 2018-12-02 (×16): 3.375 g via INTRAVENOUS
  Filled 2018-11-26 (×16): qty 50

## 2018-11-26 NOTE — Progress Notes (Signed)
Pharmacy Antibiotic Note  Angela [name] is a 73 y.o. 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  Pharmacy has been consulted for Zosyn and Vancomycin dosing.  Wt < 38mos ago ~65kg, estimated SCr 56 ml/min  Plan: Zosyn 3.375gm IV q8h - each dose over 4 hours Vancomycin 1250mg  IV now then 1000 mg IV Q 24 hrs. Goal AUC 400-550. Expected AUC: 500 SCr used: 1 Will f/u renal function, micro data, and pt's clinical condition Vanc levels prn      Temp (24hrs), Avg:97.8 F (36.6 C), Min:97.8 F (36.6 C), Max:97.8 F (36.6 C)  Recent Labs  Lab 11/26/18 1614  WBC 20.2*  CREATININE 0.97    CrCl cannot be calculated (Unknown ideal weight.).    No Known Allergies  Antimicrobials this admission: 4/2 Zosyn >>  4/2 Vancomycin >>   Microbiology results: Pending  Thank you for allowing pharmacy to be a part of this patient's care.  Sherlon Handing, PharmD, BCPS Clinical pharmacist  **Pharmacist phone directory can now be found on [email or web address] ([email or web address]).  Listed under [place]. 11/26/2018 11:25 PM

## 2018-11-26 NOTE — ED Notes (Signed)
Patient transported to CT scan . 

## 2018-11-26 NOTE — ED Notes (Signed)
St Jude representative called this RN and stated that pt has been having episodes of nonsustained VTach for several weeks. Had episode of vtach today and received 4 shocks PTA between 1400-1530

## 2018-11-26 NOTE — ED Notes (Signed)
Pt CBG was 172, notified Kevin(RN)

## 2018-11-26 NOTE — Consult Note (Signed)
Reason for Consult:Perineal abscess Referring Physician: [name] is an 73 y.o. female.  HPI: This is a very frail 73 year old female with multiple medical/ cardiac issues who presents with a perineal abscess.  The patient has an AICD and is chronically anticoagulated.  She has non-ischemic cardiomyopathy and CHF.  S/p Mitral valve replacement.  She was apparently being evaluated in outpatient clinic today for a perineal abscess when she had an episode of syncope, followed by V-tach/ V-fib.    She was brought to the ED for evaluation and is being admitted by [facility] with Cardiology consultation.  Her heart rhythm is more stable on amiodarone.  Unfortunately, her INR is 9.  Past Medical History:  Diagnosis Date  . Abnormality of gait 08/23/2014  . Arthritis   . Atrial fibrillation ([place])    A.  Chronic Coumadin  . Cardiac arrest - ventricular fibrillation    A.  12/2007;  B. 01/2008 St. Jude Promote Bi-V ICD placed  . CVA (cerebral vascular accident) ([place])   . Diverticular disease   . DVT (deep venous thrombosis) ([place])   . 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 History of colon cancer    A.  1999 - T3, N1  chemotherapy  . Internal hemorrhoids   . Memory deficit 08/23/2014  . Nonischemic cardiomyopathy ([place])    a.  11/2001 - Cath - NL Cors; b. ECHO 09/22/11: EF 20%, MVR normal, moderate LAE, mild RAE c. ECHO (08/2012): ED 20%, diff HK, LA mod dilated, mild/mod TR, RV mild/mod decreased sys fx  . Rectal bleeding 04/28/2017  . Rheumatic heart disease    A. 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 Sick sinus syndrome ([place])    A.  s/p pacer in 1995.  B.    . Systolic CHF, chronic ([place])    A.  01/2008 Echo - EF 10-20%    Past Surgical History:  Procedure Laterality Date  . ABDOMINAL HYSTERECTOMY    . AV NODE ABLATION N/A 09/23/2011   Procedure: AV NODE ABLATION;  Surgeon: Evans Lance, MD;  Location: [facility] CATH LAB;  Service:  Cardiovascular;  Laterality: N/A;  . BIV ICD GENERTAOR CHANGE OUT N/A 09/05/2014   Procedure: BIV ICD GENERTAOR CHANGE OUT;  Surgeon: Evans Lance, MD;  Location: [facility] CATH LAB;  Service: Cardiovascular;  Laterality: N/A;  . Biventricular AICD    . COLON SURGERY  1999  . History of echocardiogram  2003, 2007, 2009  . Mitral Valve Replacement, Bjork-Shiley valve  1983    Family History  Problem Relation Age of Onset  . Diabetes Mother 4  . Coronary artery disease Mother        s/p cabg  . Lung cancer Father 28       smoker  . Coronary artery disease Sister        s/p cabg    Social History:  reports that she has never smoked. She has never used smokeless tobacco. She reports that she does not drink alcohol or use drugs.  Allergies: No Known Allergies  Medications:  Prior to Admission medications   Medication Sig Start Date End Date Taking? Authorizing Provider  alendronate (FOSAMAX) 70 MG tablet Take 70 mg by mouth once a week. Take with a full glass of water on an empty stomach.    [provider]  carvedilol (COREG) 3.125 MG tablet Take 1 tablet (3.125  mg total) by mouth 2 (two) times daily with a meal. 03/18/18   Evans Lance, MD  cholecalciferol (VITAMIN D) 1000 UNITS tablet Take 2,000 Units by mouth daily.    [provider]  digoxin (LANOXIN) 0.125 MG tablet Take 0.125 mg by mouth daily.    [provider]  furosemide (LASIX) 40 MG tablet TAKE ONE (1) TABLET BY MOUTH EVERY DAY. IF WEIGHT IS GREATER THAN 140LBS ON HOME SCALE, TAKE 2 TABLETS (80MG ) Patient taking differently: Take 20 mg by mouth daily as needed for fluid.  02/03/17   Evans Lance, MD  lisinopril (PRINIVIL,ZESTRIL) 2.5 MG tablet Take 1 tablet (2.5 mg total) by mouth daily. 10/29/18   Debbe Odea, MD  tiZANidine (ZANAFLEX) 4 MG tablet Take 4 mg by mouth 2 (two) times daily.     [provider]  warfarin (COUMADIN) 4 MG tablet Take 2 tablets (8 mg total) by mouth daily at 6  PM. Patient taking differently: Take 6 mg by mouth daily at 6 PM.  05/08/17   Murlean Iba, MD     Results for orders placed or performed during the hospital encounter of 11/26/18 (from the past 48 hour(s))  CBC WITH DIFFERENTIAL     Status: Abnormal   Collection Time: 11/26/18  4:14 PM  Result Value Ref Range   WBC 20.2 (H) 4.0 - 10.5 K/uL   RBC 5.38 (H) 3.87 - 5.11 MIL/uL   Hemoglobin 15.3 (H) 12.0 - 15.0 g/dL   HCT 47.5 (H) 36.0 - 46.0 %   MCV 88.3 80.0 - 100.0 fL   MCH 28.4 26.0 - 34.0 pg   MCHC 32.2 30.0 - 36.0 g/dL   RDW 15.0 11.5 - 15.5 %   Platelets 299 150 - 400 K/uL   nRBC 0.0 0.0 - 0.2 %   Neutrophils Relative % 91 %   Neutro Abs 18.5 (H) 1.7 - 7.7 K/uL   Lymphocytes Relative 3 %   Lymphs Abs 0.5 (L) 0.7 - 4.0 K/uL   Monocytes Relative 4 %   Monocytes Absolute 0.7 0.1 - 1.0 K/uL   Eosinophils Relative 0 %   Eosinophils Absolute 0.0 0.0 - 0.5 K/uL   Basophils Relative 0 %   Basophils Absolute 0.0 0.0 - 0.1 K/uL   Immature Granulocytes 2 %   Abs Immature Granulocytes 0.40 (H) 0.00 - 0.07 K/uL    Comment: Performed at [facility], [address] [place], [place] [address]  Comprehensive metabolic panel     Status: Abnormal   Collection Time: 11/26/18  4:14 PM  Result Value Ref Range   Sodium 134 (L) 135 - 145 mmol/L   Potassium 4.1 3.5 - 5.1 mmol/L   Chloride 99 98 - 111 mmol/L   CO2 20 (L) 22 - 32 mmol/L   Glucose, Bld 181 (H) 70 - 99 mg/dL   BUN 17 8 - 23 mg/dL   Creatinine, Ser 0.97 0.44 - 1.00 mg/dL   Calcium 8.8 (L) 8.9 - 10.3 mg/dL   Total Protein 7.0 6.5 - 8.1 g/dL   Albumin 2.7 (L) 3.5 - 5.0 g/dL   AST 41 15 - 41 U/L   ALT 44 0 - 44 U/L   Alkaline Phosphatase 76 38 - 126 U/L   Total Bilirubin 1.9 (H) 0.3 - 1.2 mg/dL   GFR calc non Af Amer 58 (L) >60 mL/min   GFR calc Af Amer >60 >60 mL/min   Anion gap 15 5 - 15  Comment: Performed at [facility], [place] [address] [place], [place] [address]  Magnesium     Status: None    Collection Time: 11/26/18  4:14 PM  Result Value Ref Range   Magnesium 1.8 1.7 - 2.4 mg/dL    Comment: Performed at [facility] [address], [place], [place] [address]  Troponin I - ONCE - STAT     Status: Abnormal   Collection Time: 11/26/18  4:14 PM  Result Value Ref Range   Troponin I 0.16 (HH) <0.03 ng/mL    Comment: CRITICAL RESULT CALLED TO, READ BACK BY AND VERIFIED WITH: B HANNA,RN 1729 11/26/2018 WBOND Performed at [facility], [place] [address] [place], [place] [address]   Digoxin level     Status: Abnormal   Collection Time: 11/26/18  4:14 PM  Result Value Ref Range   Digoxin Level 0.4 (L) 0.8 - 2.0 ng/mL    Comment: Performed at [facility], [place] [address] [place], [place] [address]  CBG monitoring, ED     Status: Abnormal   Collection Time: 11/26/18  4:17 PM  Result Value Ref Range   Glucose-Capillary 172 (H) 70 - 99 mg/dL   Comment 1 Notify RN    Comment 2 Document in Chart   Urinalysis, Routine w reflex microscopic     Status: Abnormal   Collection Time: 11/26/18  8:47 PM  Result Value Ref Range   Color, Urine AMBER (A) YELLOW    Comment: BIOCHEMICALS MAY BE AFFECTED BY COLOR   APPearance HAZY (A) CLEAR   Specific Gravity, Urine 1.023 1.005 - 1.030   pH 5.0 5.0 - 8.0   Glucose, UA NEGATIVE NEGATIVE mg/dL   Hgb urine dipstick NEGATIVE NEGATIVE   Bilirubin Urine NEGATIVE NEGATIVE   Ketones, ur NEGATIVE NEGATIVE mg/dL   Protein, ur NEGATIVE NEGATIVE mg/dL   Nitrite NEGATIVE NEGATIVE   Leukocytes,Ua NEGATIVE NEGATIVE    Comment: Performed at [facility], [place] [address], [place], [place] [address]  Protime-INR     Status: Abnormal   Collection Time: 11/26/18  8:53 PM  Result Value Ref Range   Prothrombin Time 72.7 (H) 11.4 - 15.2 seconds   INR 9.1 (HH) 0.8 - 1.2    Comment: REPEATED TO VERIFY SPECIMEN CHECKED FOR CLOTS CRITICAL RESULT CALLED TO, READ BACK BY AND VERIFIED WITHGillis Ends RN [phone] SHORTT  (NOTE) INR  goal varies based on device and disease states. Performed at [facility], [place] [address] [place], [place] [address]     Ct Abdomen Pelvis W Contrast  Result Date: 11/26/2018 CLINICAL DATA:  Anorectal abscess EXAM: CT ABDOMEN AND PELVIS WITH CONTRAST TECHNIQUE: Multidetector CT imaging of the abdomen and pelvis was performed using the standard protocol following bolus administration of intravenous contrast. CONTRAST:  114mL OMNIPAQUE IOHEXOL 300 MG/ML  SOLN COMPARISON:  Virtual colonoscopy dated 05/04/2014 FINDINGS: Motion degraded images. Lower chest: Cardiomegaly. Pacemaker leads, incompletely visualized. Hepatobiliary: Liver is within normal limits. Gallbladder is unremarkable. No intrahepatic or extrahepatic ductal dilatation. Pancreas: Within normal limits. Spleen: Within normal limits. Adrenals/Urinary Tract: Adrenal glands are within normal limits. Moderate right hydronephrosis with UPJ configuration, chronic. Overlying cortical thinning/scarring, chronic. Small left renal cysts, poorly evaluated due to motion degradation. Extrarenal pelvis. Bladder is within normal limits. Stomach/Bowel: Stomach is within normal limits. No evidence of bowel obstruction. Status post partial right hemicolectomy with suture line in the right mid abdomen. Appendix is not discretely visualized.  Extensive left colonic diverticulosis, without evidence of diverticulitis. No discrete perianal/perirectal fluid collection/abscess. However, there are multiple foci of gas in the right perineal and right medial gluteal region (series 3/images 85 and 90). In the absence of recent surgical intervention, this is worrisome for perineal infection, Fournier's gangrene not excluded. Vascular/Lymphatic: No evidence of abdominal aortic aneurysm. Atherosclerotic calcifications of the abdominal aorta and branch vessels. No suspicious abdominopelvic lymphadenopathy. Reproductive: Status post hysterectomy. No adnexal masses. Other: No  abdominopelvic ascites. Musculoskeletal: Mild degenerative changes of the visualized thoracolumbar spine. IMPRESSION: No discrete perianal/perirectal fluid collection/abscess. Multiple foci of gas in the right perineal and right medial gluteal region. In the absence of recent surgical intervention, this is worrisome for perineal infection, Fournier's gangrene not excluded. Surgical consultation is advised. These results were called by telephone at the time of interpretation on 11/26/2018 at 10:30 pm to Dr. Isla Pence , who verbally acknowledged these results. Electronically Signed   By: Julian Hy M.D.   On: 11/26/2018 22:33   Dg Chest Port 1 View  Result Date: 11/26/2018 CLINICAL DATA:  Syncopal episode with loss of consciousness while in the waiting room of the surgical center earlier today. Indwelling biventricular pacing defibrillator. EXAM: PORTABLE CHEST 1 VIEW COMPARISON:  10/26/2018 and earlier. FINDINGS: Cardiac silhouette markedly enlarged, unchanged. Prior mitral valve replacement. LEFT subclavian biventricular pacing defibrillator unchanged. RIGHT subclavian dual lead transvenous pacemaker unchanged. Lungs clear. Bronchovascular markings normal, with calcified tracheobronchial cartilages. Pulmonary vascularity normal. No visible pleural effusions. No pneumothorax. Stable BILATERAL lower lobe bronchiectasis. IMPRESSION: Stable marked cardiomegaly. No acute cardiopulmonary disease. Electronically Signed   By: Evangeline Dakin M.D.   On: 11/26/2018 16:12    Review of Systems  Constitutional: Negative for weight loss.  HENT: Negative for ear discharge, ear pain, hearing loss and tinnitus.   Eyes: Negative for blurred vision, double vision, photophobia and pain.  Respiratory: Negative for cough, sputum production and shortness of breath.   Cardiovascular: Negative for chest pain.  Gastrointestinal: Negative for abdominal pain, nausea and vomiting.  Genitourinary: Negative for dysuria,  flank pain, frequency and urgency.  Musculoskeletal: Negative for back pain, falls, joint pain, myalgias and neck pain.  Neurological: Positive for loss of consciousness and weakness. Negative for dizziness, tingling, sensory change, focal weakness and headaches.  Endo/Heme/Allergies: Does not bruise/bleed easily.  Psychiatric/Behavioral: Negative for depression, memory loss and substance abuse. The patient is not nervous/anxious.    Blood pressure (!) 100/52, pulse 76, temperature 97.8 F (36.6 C), temperature source Temporal, resp. rate 13, SpO2 97 %. Physical Exam Frail elderly female in NAD Eyes:  Pupils equal, round; sclera anicteric HENT:  Oral mucosa moist; good dentition  Neck:  No masses palpated, no thyromegaly Lungs:  CTA bilaterally; normal respiratory effort CV:  Regular rate and rhythm; no murmurs; extremities well-perfused with no edema Abd:  +bowel sounds, soft, non-tender, no palpable organomegaly; no palpable hernias GU:  Large right-sided induration/ erythema/ spontaneously draining perineal abscess Skin:  Warm, dry; no sign of jaundice Psychiatric - awake, oriented to name and location; calm mood and affect  Assessment/Plan: 1.  Perineal/ perirectal abscess - needs to be drained under anesthesia 2.  Supratherapeutic INR 3.  Multiple medical comorbidities  Recs;   IV antibiotics AGGRESSIVE reversal of INR - FFP, Vit K INR needs to be around 1.5 before we can proceed to OR for I&D of perineal abscess Needs cardiology to clear her for general anesthesia  Imogene Burn Tsuei 11/26/2018, 10:50 PM

## 2018-11-26 NOTE — Progress Notes (Signed)
  Amiodarone Drug - Drug Interaction Consult Note  Recommendations: No medication interactions at this time. Monitor vital signs and for future interactions - PTA med list includes warfarin, lasix, digoxin, and carvedilol.  Amiodarone is metabolized by the cytochrome P450 system and therefore has the potential to cause many drug interactions. Amiodarone has an average plasma half-life of 50 days (range 20 to 100 days).   There is potential for drug interactions to occur several weeks or months after stopping treatment and the onset of drug interactions may be slow after initiating amiodarone.   []  Statins: Increased risk of myopathy. Simvastatin- restrict dose to 20mg  daily. Other statins: counsel patients to report any muscle pain or weakness immediately.  []  Anticoagulants: Amiodarone can increase anticoagulant effect. Consider warfarin dose reduction. Patients should be monitored closely and the dose of anticoagulant altered accordingly, remembering that amiodarone levels take several weeks to stabilize.  []  Antiepileptics: Amiodarone can increase plasma concentration of phenytoin, the dose should be reduced. Note that small changes in phenytoin dose can result in large changes in levels. Monitor patient and counsel on signs of toxicity.  []  Beta blockers: increased risk of bradycardia, AV block and myocardial depression. Sotalol - avoid concomitant use.  []   Calcium channel blockers (diltiazem and verapamil): increased risk of bradycardia, AV block and myocardial depression.  []   Cyclosporine: Amiodarone increases levels of cyclosporine. Reduced dose of cyclosporine is recommended.  []  Digoxin dose should be halved when amiodarone is started.  []  Diuretics: increased risk of cardiotoxicity if hypokalemia occurs.  []  Oral hypoglycemic agents (glyburide, glipizide, glimepiride): increased risk of hypoglycemia. Patient's glucose levels should be monitored closely when initiating amiodarone  therapy.   []  Drugs that prolong the QT interval:  Torsades de pointes risk may be increased with concurrent use - avoid if possible.  Monitor QTc, also keep magnesium/potassium WNL if concurrent therapy can't be avoided. [name] Antibiotics: e.g. fluoroquinolones, erythromycin. . Antiarrhythmics: e.g. quinidine, procainamide, disopyramide, sotalol. . Antipsychotics: e.g. phenothiazines, haloperidol.  . Lithium, tricyclic antidepressants, and methadone.  Thank You,  Antonietta Jewel, PharmD, [place] Clinical Pharmacist  Pager: [phone] Phone: [phone] 11/26/2018 8:11 PM

## 2018-11-26 NOTE — ED Provider Notes (Addendum)
[facility] EMERGENCY DEPARTMENT Provider Note   CSN: [id number] Arrival date & time: 11/26/18  1543    History   Chief Complaint Chief Complaint  Patient presents with   Loss of Consciousness    HPI Angela [name] is a 73 y.o. female.     Pt presents to the ED today with a syncopal episode.  The pt was at the surgical center and had a syncopal episode in the lobby.  The staff felt her defibrillator fire.  The pt has a hx of vfib and vtach.  They called EMS who noted vtach with defib fire again.  Pt denies any cp.  Dr. Lovena Le is her EP dr.  Abbott Pao d/w her husband and he said she was at [facility] to see the surgeon about a boil she has on her bottom.     Past Medical History:  Diagnosis Date   Abnormality of gait 08/23/2014   Arthritis    Atrial fibrillation ([place])    A.  Chronic Coumadin   Cardiac arrest - ventricular fibrillation    A.  12/2007;  B. 01/2008 St. 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  12/2001 iliofemoral embolus s/p r fem embolectomy   History of colon cancer    A.  1999 - T3, N1  chemotherapy   Internal hemorrhoids    Memory deficit 08/23/2014   Nonischemic cardiomyopathy ([place])    a.  11/2001 - Cath - NL Cors; b. ECHO 09/22/11: EF 20%, MVR normal, moderate LAE, mild RAE c. ECHO (08/2012): ED 20%, diff HK, LA mod dilated, mild/mod TR, RV mild/mod decreased sys fx   Rectal bleeding 04/28/2017   Rheumatic heart disease    A. 1983 s/p  Bjork-Shiley MVR   Sick sinus syndrome ([place])    A.  s/p pacer in 1995.  B.     Systolic CHF, chronic ([place])    A.  01/2008 Echo - EF 10-20%    Patient Active Problem List   Diagnosis Date Noted   Syncope 11/26/2018   VT (ventricular tachycardia) ([place]) 11/26/2018   Supratherapeutic INR 11/26/2018   Acute on chronic systolic (congestive)  heart failure ([place]) 10/27/2018   Elevated troponin 10/26/2018   Anemia 10/26/2018   Palliative care by specialist    Rectal bleeding 04/28/2017   Acute blood loss anemia 04/28/2017   ICD (implantable cardioverter-defibrillator), biventricular, in situ 08/24/2014   Memory deficit 08/23/2014   Abnormality of gait 08/23/2014   Dementia ([place]) 12/17/2013   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) 07/07/2012   Ventricular fibrillation ([place]) 11/11/2011   Warfarin anticoagulation 09/22/2011   Nonischemic cardiomyopathy ([place])    Atrial fibrillation (HCC)    History of colon cancer    Internal hemorrhoids    Rheumatic heart disease    Sick sinus syndrome ([place])    Systolic CHF, chronic ([place])    DIVERTICULAR DISEASE 06/05/2009    Past Surgical History:  Procedure Laterality Date   ABDOMINAL HYSTERECTOMY     AV NODE ABLATION N/A 09/23/2011   Procedure: AV NODE ABLATION;  Surgeon: Evans Lance, MD;  Location: [facility] CATH LAB;  Service: Cardiovascular;  Laterality: N/A;   BIV ICD GENERTAOR CHANGE OUT N/A 09/05/2014   Procedure: BIV ICD GENERTAOR CHANGE OUT;  Surgeon: Evans Lance, MD;  Location: [facility] CATH LAB;  Service: Cardiovascular;  Laterality: N/A;   Biventricular AICD     COLON SURGERY  1999   History of echocardiogram  2003, 2007, 2009   Mitral Valve Replacement, Bjork-Shiley valve  1983     OB History   No obstetric history on file.      Home Medications    Prior to Admission medications   Medication Sig Start Date End Date Taking? Authorizing Provider  alendronate (FOSAMAX) 70 MG tablet Take 70 mg by mouth once a week. Take with a full glass of water on an empty stomach.    [provider]  carvedilol (COREG) 3.125 MG tablet Take 1 tablet (3.125 mg  total) by mouth 2 (two) times daily with a meal. 03/18/18   Evans Lance, MD  cholecalciferol (VITAMIN D) 1000 UNITS tablet Take 2,000 Units by mouth daily.    [provider]  digoxin (LANOXIN) 0.125 MG tablet Take 0.125 mg by mouth daily.    [provider]  furosemide (LASIX) 40 MG tablet TAKE ONE (1) TABLET BY MOUTH EVERY DAY. IF WEIGHT IS GREATER THAN 140LBS ON HOME SCALE, TAKE 2 TABLETS (80MG ) Patient taking differently: Take 20 mg by mouth daily as needed for fluid.  02/03/17   Evans Lance, MD  lisinopril (PRINIVIL,ZESTRIL) 2.5 MG tablet Take 1 tablet (2.5 mg total) by mouth daily. 10/29/18   Debbe Odea, MD  tiZANidine (ZANAFLEX) 4 MG tablet Take 4 mg by mouth 2 (two) times daily.     [provider]  warfarin (COUMADIN) 4 MG tablet Take 2 tablets (8 mg total) by mouth daily at 6 PM. Patient taking differently: Take 6 mg by mouth daily at 6 PM.  05/08/17   Murlean Iba, MD    Family History Family History  Problem Relation Age of Onset   Diabetes Mother 6   Coronary artery disease Mother        s/p cabg   Lung cancer Father 60       smoker   Coronary artery disease Sister        s/p cabg    Social History Social History   Tobacco Use   Smoking status: Never Smoker   Smokeless tobacco: Never Used  Substance Use Topics   Alcohol use: No   Drug use: No     Allergies   Patient has no known allergies.   Review of Systems Review of Systems  Neurological: Positive for syncope.  All other systems reviewed and are negative.    Physical Exam Updated Vital Signs BP (!) 100/52    Pulse 76    Temp 97.8 F (36.6 C) (Temporal)    Resp 13    SpO2 97%   Physical Exam Vitals signs and nursing note reviewed.  Constitutional:      Appearance: Normal appearance.  HENT:     Head: Normocephalic and atraumatic.     Right Ear: External ear normal.     Left Ear: External ear normal.     Nose: Nose normal.     Mouth/Throat:      Mouth: Mucous membranes are moist.  Eyes:     Extraocular Movements: Extraocular movements intact.     Conjunctiva/sclera: Conjunctivae normal.     Pupils: Pupils are equal, round, and reactive to light.  Neck:  Musculoskeletal: Normal range of motion and neck supple.  Cardiovascular:     Rate and Rhythm: Normal rate and regular rhythm.     Pulses: Normal pulses.     Heart sounds: Normal heart sounds.  Pulmonary:     Effort: Pulmonary effort is normal.     Breath sounds: Normal breath sounds.  Abdominal:     General: Abdomen is flat. Bowel sounds are normal.     Palpations: Abdomen is soft.  Musculoskeletal: Normal range of motion.  Skin:    General: Skin is warm.     Capillary Refill: Capillary refill takes less than 2 seconds.     Comments: Large perirectal abscess.  Neurological:     General: No focal deficit present.     Mental Status: She is alert.     Comments: Pt knows her name and that she's in a hospital.  Psychiatric:        Mood and Affect: Mood normal.        Behavior: Behavior normal.      ED Treatments / Results  Labs (all labs ordered are listed, but only abnormal results are displayed) Labs Reviewed  CBC WITH DIFFERENTIAL/PLATELET - Abnormal; Notable for the following components:      Result Value   WBC 20.2 (*)    RBC 5.38 (*)    Hemoglobin 15.3 (*)    HCT 47.5 (*)    Neutro Abs 18.5 (*)    Lymphs Abs 0.5 (*)    Abs Immature Granulocytes 0.40 (*)    All other components within normal limits  COMPREHENSIVE METABOLIC PANEL - Abnormal; Notable for the following components:   Sodium 134 (*)    CO2 20 (*)    Glucose, Bld 181 (*)    Calcium 8.8 (*)    Albumin 2.7 (*)    Total Bilirubin 1.9 (*)    GFR calc non Af Amer 58 (*)    All other components within normal limits  URINALYSIS, ROUTINE W REFLEX MICROSCOPIC - Abnormal; Notable for the following components:   Color, Urine AMBER (*)    APPearance HAZY (*)    All other components within normal  limits  TROPONIN I - Abnormal; Notable for the following components:   Troponin I 0.16 (*)    All other components within normal limits  DIGOXIN LEVEL - Abnormal; Notable for the following components:   Digoxin Level 0.4 (*)    All other components within normal limits  PROTIME-INR - Abnormal; Notable for the following components:   Prothrombin Time 72.7 (*)    INR 9.1 (*)    All other components within normal limits  CBG MONITORING, ED - Abnormal; Notable for the following components:   Glucose-Capillary 172 (*)    All other components within normal limits  CULTURE, BLOOD (ROUTINE X 2)  CULTURE, BLOOD (ROUTINE X 2)  MAGNESIUM    EKG EKG Interpretation  Date/Time:  Thursday November 26 2018 15:54:15 EDT Ventricular Rate:  87 PR Interval:    QRS Duration: 140 QT Interval:  511 QTC Calculation: 636 R Axis:   -65 Text Interpretation:  Atrial fibrillation Ventricular tachycardia, unsustained IVCD, consider atypical RBBB LVH with secondary repolarization abnormality Inferior infarct, acute (RCA) Probable posterior infarct, acute Probable anterior infarct, age indeterminate Lateral leads are also involved Probable RV involvement, suggest recording right precordial leads Ventricular demand pacing Confirmed by Isla Pence [phone]) on 11/26/2018 4:13:17 PM   Radiology Ct Abdomen Pelvis W Contrast  Result Date: 11/26/2018 CLINICAL DATA:  Anorectal abscess EXAM: CT ABDOMEN AND PELVIS WITH CONTRAST TECHNIQUE: Multidetector CT imaging of the abdomen and pelvis was performed using the standard protocol following bolus administration of intravenous contrast. CONTRAST:  180mL OMNIPAQUE IOHEXOL 300 MG/ML  SOLN COMPARISON:  Virtual colonoscopy dated 05/04/2014 FINDINGS: Motion degraded images. Lower chest: Cardiomegaly. Pacemaker leads, incompletely visualized. Hepatobiliary: Liver is within normal limits. Gallbladder is unremarkable. No intrahepatic or extrahepatic ductal dilatation. Pancreas: Within  normal limits. Spleen: Within normal limits. Adrenals/Urinary Tract: Adrenal glands are within normal limits. Moderate right hydronephrosis with UPJ configuration, chronic. Overlying cortical thinning/scarring, chronic. Small left renal cysts, poorly evaluated due to motion degradation. Extrarenal pelvis. Bladder is within normal limits. Stomach/Bowel: Stomach is within normal limits. No evidence of bowel obstruction. Status post partial right hemicolectomy with suture line in the right mid abdomen. Appendix is not discretely visualized. Extensive left colonic diverticulosis, without evidence of diverticulitis. No discrete perianal/perirectal fluid collection/abscess. However, there are multiple foci of gas in the right perineal and right medial gluteal region (series 3/images 85 and 90). In the absence of recent surgical intervention, this is worrisome for perineal infection, Fournier's gangrene not excluded. Vascular/Lymphatic: No evidence of abdominal aortic aneurysm. Atherosclerotic calcifications of the abdominal aorta and branch vessels. No suspicious abdominopelvic lymphadenopathy. Reproductive: Status post hysterectomy. No adnexal masses. Other: No abdominopelvic ascites. Musculoskeletal: Mild degenerative changes of the visualized thoracolumbar spine. IMPRESSION: No discrete perianal/perirectal fluid collection/abscess. Multiple foci of gas in the right perineal and right medial gluteal region. In the absence of recent surgical intervention, this is worrisome for perineal infection, Fournier's gangrene not excluded. Surgical consultation is advised. These results were called by telephone at the time of interpretation on 11/26/2018 at 10:30 pm to Dr. Isla Pence , who verbally acknowledged these results. Electronically Signed   By: Julian Hy M.D.   On: 11/26/2018 22:33   Dg Chest Port 1 View  Result Date: 11/26/2018 CLINICAL DATA:  Syncopal episode with loss of consciousness while in the waiting  room of the surgical center earlier today. Indwelling biventricular pacing defibrillator. EXAM: PORTABLE CHEST 1 VIEW COMPARISON:  10/26/2018 and earlier. FINDINGS: Cardiac silhouette markedly enlarged, unchanged. Prior mitral valve replacement. LEFT subclavian biventricular pacing defibrillator unchanged. RIGHT subclavian dual lead transvenous pacemaker unchanged. Lungs clear. Bronchovascular markings normal, with calcified tracheobronchial cartilages. Pulmonary vascularity normal. No visible pleural effusions. No pneumothorax. Stable BILATERAL lower lobe bronchiectasis. IMPRESSION: Stable marked cardiomegaly. No acute cardiopulmonary disease. Electronically Signed   By: Evangeline Dakin M.D.   On: 11/26/2018 16:12    Procedures Procedures (including critical care time)  Medications Ordered in ED Medications  sodium chloride 0.9 % bolus 1,000 mL (0 mLs Intravenous Stopped 11/26/18 1833)    And  0.9 %  sodium chloride infusion ( Intravenous New Bag/Given 11/26/18 2005)  amiodarone (NEXTERONE PREMIX) 360-4.14 MG/200ML-% (1.8 mg/mL) IV infusion (60 mg/hr Intravenous New Bag/Given 11/26/18 2004)    Followed by  amiodarone (NEXTERONE PREMIX) 360-4.14 MG/200ML-% (1.8 mg/mL) IV infusion (has no administration in time range)  piperacillin-tazobactam (ZOSYN) IVPB 3.375 g (3.375 g Intravenous New Bag/Given 11/26/18 2236)  amiodarone (NEXTERONE) 1.8 mg/mL load via infusion 150 mg (150 mg Intravenous Bolus from Bag 11/26/18 2004)  magnesium sulfate IVPB 2 g 50 mL (0 g Intravenous Stopped 11/26/18 2200)  iohexol (OMNIPAQUE) 300 MG/ML solution 100 mL (100 mLs Intravenous Contrast Given 11/26/18 2205)     Initial Impression / Assessment and Plan / ED Course  I have reviewed the triage vital signs and the nursing notes.  Pertinent labs & imaging results that were available during my care of the patient were reviewed by me and considered in my medical decision making (see chart for details).         AICD  interrogated.  She has received 4 shocks today for VF.  She has had several NSVT for the past 2 weeks.  Pt d/w Dr. Bronson Ing (cards) who asked [place] to call Dr. Lovena Le.  We were unable to get ahold of Dr. Lovena Le, but the cards NP did talk to him.  He recommended amiodarone bolus and drip.  This was started.  Dr. Bronson Ing did see pt and recommends hospitalist admit due to the elevated WBC.  Elevated WBC likely due to abscess.  I added on a ct abd/pelvis to see how extensive it is.  CRITICAL CARE Performed by: Isla Pence   Total critical care time: 30 minutes  Critical care time was exclusive of separately billable procedures and treating other patients.  Critical care was necessary to treat or prevent imminent or life-threatening deterioration.  Critical care was time spent personally by me on the following activities: development of treatment plan with patient and/or surrogate as well as nursing, discussions with consultants, evaluation of patient's response to treatment, examination of patient, obtaining history from patient or surrogate, ordering and performing treatments and interventions, ordering and review of laboratory studies, ordering and review of radiographic studies, pulse oximetry and re-evaluation of patient's condition.  Pt d/w Dr. Posey Pronto (triad) for admission.  Pt also d/w Dr. Gershon Crane (surgery) for the perineal abscess/fournier's.  He will see her, but won't do anything until her INR is lower.  Final Clinical Impressions(s) / ED Diagnoses   Final diagnoses:  Ventricular tachycardia ([place])  Ventricular fibrillation (HCC)  Elevated INR  Perirectal abscess    ED Discharge Orders    None       Isla Pence, MD 11/26/18 2213    Isla Pence, MD 11/26/18 2244

## 2018-11-26 NOTE — ED Notes (Signed)
Anavey Coombes pts husband wants an update @ [phone]

## 2018-11-26 NOTE — ED Triage Notes (Signed)
Pt BIB [facility] from surgical center, pt had syncopal episode in lobby, staff felt her defibrillator fire. HX Vtach. En route, EMS noted a run of vtach again, with defibrillator fire again. Pr denies pain at this time.

## 2018-11-26 NOTE — ED Notes (Signed)
Pt's blue top hemolyzed needs recollet

## 2018-11-26 NOTE — ED Notes (Signed)
ED TO INPATIENT HANDOFF REPORT  ED Nurse Name and Phone #:  Clydene Laming [phone]  S Name/Age/Gender Angela [name] 73 y.o. female Room/Bed: TRAAC/TRAAC  Code Status: Full   Home/SNF/Other : Home  {Patient oriented to: Person / Place /Situation  Is this baseline? Yes  Triage Complete: Triage complete  Chief Complaint 72yo f, Syncope/ V Tach  Triage Note Pt BIB [facility] from surgical center, pt had syncopal episode in lobby, staff felt her defibrillator fire. HX Vtach. En route, EMS noted a run of vtach again, with defibrillator fire again. Pr denies pain at this time.   Allergies No Known Allergies  Level of Care/Admitting Diagnosis ED Disposition    ED Disposition Condition Comment   Admit  Hospital Area: [place] [[id number]]  Level of Care: Progressive [102]  Diagnosis: Ventricular fibrillation ([place]) [427.41.ICD-9-CM]  Admitting Physician: Lenore Cordia [[id number]]  Attending Physician: Lenore Cordia [[id number]]  Estimated length of stay: past midnight tomorrow  Certification:: I certify this patient will need inpatient services for at least 2 midnights  PT Class (Do Not Modify): Inpatient [101]  PT Acc Code (Do Not Modify): Private [1]       B Medical/Surgery History Past Medical History:  Diagnosis Date  . Abnormality of gait 08/23/2014  . Arthritis   . Atrial fibrillation ([place])    A.  Chronic Coumadin  . Cardiac arrest - ventricular fibrillation    A.  12/2007;  B. 01/2008 St. Jude Promote Bi-V ICD placed  . CVA (cerebral vascular accident) ([place])   . Diverticular disease   . DVT (deep venous thrombosis) ([place])   . 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 History of colon cancer    A.  1999 - T3, N1  chemotherapy  . Internal hemorrhoids   . Memory deficit 08/23/2014  . Nonischemic cardiomyopathy ([place])    a.  11/2001 - Cath - NL Cors; b. ECHO 09/22/11: EF 20%, MVR normal, moderate  LAE, mild RAE c. ECHO (08/2012): ED 20%, diff HK, LA mod dilated, mild/mod TR, RV mild/mod decreased sys fx  . Rectal bleeding 04/28/2017  . Rheumatic heart disease    A. 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 Sick sinus syndrome ([place])    A.  s/p pacer in 1995.  B.    . Systolic CHF, chronic ([facility])    A.  01/2008 Echo - EF 10-20%   Past Surgical History:  Procedure Laterality Date  . ABDOMINAL HYSTERECTOMY    . AV NODE ABLATION N/A 09/23/2011   Procedure: AV NODE ABLATION;  Surgeon: Evans Lance, MD;  Location: [facility] CATH LAB;  Service: Cardiovascular;  Laterality: N/A;  . BIV ICD GENERTAOR CHANGE OUT N/A 09/05/2014   Procedure: BIV ICD GENERTAOR CHANGE OUT;  Surgeon: Evans Lance, MD;  Location: [facility] CATH LAB;  Service: Cardiovascular;  Laterality: N/A;  . Biventricular AICD    . COLON SURGERY  1999  . History of echocardiogram  2003, 2007, 2009  . Mitral Valve Replacement, Bjork-Shiley valve  1983     A IV Location/Drains/Wounds Patient Lines/Drains/Airways Status   Active Line/Drains/Airways    Name:   Placement date:   Placement time:   Site:   Days:   Peripheral IV 11/26/18 Right Hand   11/26/18    1614    Hand   less than 1   Peripheral IV 11/26/18 Left Wrist  11/26/18    1605    Wrist   less than 1          Intake/Output Last 24 hours  Intake/Output Summary (Last 24 hours) at 11/26/2018 2246 Last data filed at 11/26/2018 1833 Gross per 24 hour  Intake 1000 ml  Output -  Net 1000 ml    Labs/Imaging Results for orders placed or performed during the hospital encounter of 11/26/18 (from the past 48 hour(s))  CBC WITH DIFFERENTIAL     Status: Abnormal   Collection Time: 11/26/18  4:14 PM  Result Value Ref Range   WBC 20.2 (H) 4.0 - 10.5 K/uL   RBC 5.38 (H) 3.87 - 5.11 MIL/uL   Hemoglobin 15.3 (H) 12.0 - 15.0 g/dL   HCT 47.5 (H) 36.0 - 46.0 %   MCV 88.3 80.0 - 100.0 fL   MCH 28.4 26.0 - 34.0 pg   MCHC 32.2 30.0 - 36.0 g/dL   RDW 15.0 11.5 - 15.5 %   Platelets 299 150 - 400  K/uL   nRBC 0.0 0.0 - 0.2 %   Neutrophils Relative % 91 %   Neutro Abs 18.5 (H) 1.7 - 7.7 K/uL   Lymphocytes Relative 3 %   Lymphs Abs 0.5 (L) 0.7 - 4.0 K/uL   Monocytes Relative 4 %   Monocytes Absolute 0.7 0.1 - 1.0 K/uL   Eosinophils Relative 0 %   Eosinophils Absolute 0.0 0.0 - 0.5 K/uL   Basophils Relative 0 %   Basophils Absolute 0.0 0.0 - 0.1 K/uL   Immature Granulocytes 2 %   Abs Immature Granulocytes 0.40 (H) 0.00 - 0.07 K/uL    Comment: Performed at [facility], [address], [place], [place] [address]  Comprehensive metabolic panel     Status: Abnormal   Collection Time: 11/26/18  4:14 PM  Result Value Ref Range   Sodium 134 (L) 135 - 145 mmol/L   Potassium 4.1 3.5 - 5.1 mmol/L   Chloride 99 98 - 111 mmol/L   CO2 20 (L) 22 - 32 mmol/L   Glucose, Bld 181 (H) 70 - 99 mg/dL   BUN 17 8 - 23 mg/dL   Creatinine, Ser 0.97 0.44 - 1.00 mg/dL   Calcium 8.8 (L) 8.9 - 10.3 mg/dL   Total Protein 7.0 6.5 - 8.1 g/dL   Albumin 2.7 (L) 3.5 - 5.0 g/dL   AST 41 15 - 41 U/L   ALT 44 0 - 44 U/L   Alkaline Phosphatase 76 38 - 126 U/L   Total Bilirubin 1.9 (H) 0.3 - 1.2 mg/dL   GFR calc non Af Amer 58 (L) >60 mL/min   GFR calc Af Amer >60 >60 mL/min   Anion gap 15 5 - 15    Comment: Performed at [facility], [place] [address] [place], [place] [address]  Magnesium     Status: None   Collection Time: 11/26/18  4:14 PM  Result Value Ref Range   Magnesium 1.8 1.7 - 2.4 mg/dL    Comment: Performed at [facility] [address], [place], [place] [address]  Troponin I - ONCE - STAT     Status: Abnormal   Collection Time: 11/26/18  4:14 PM  Result Value Ref Range   Troponin I 0.16 (HH) <0.03 ng/mL    Comment: CRITICAL RESULT CALLED TO, READ BACK BY AND VERIFIED WITH: B HANNA,RN 1729 11/26/2018 WBOND Performed at [facility], [place] [address], [place], [place]  [address]   Digoxin level     Status: Abnormal   Collection Time: 11/26/18  4:14 PM  Result Value Ref  Range   Digoxin Level 0.4 (L) 0.8 - 2.0 ng/mL    Comment: Performed at [facility], [place] [address] [place], [place] [address]  CBG monitoring, ED     Status: Abnormal   Collection Time: 11/26/18  4:17 PM  Result Value Ref Range   Glucose-Capillary 172 (H) 70 - 99 mg/dL   Comment 1 Notify RN    Comment 2 Document in Chart   Urinalysis, Routine w reflex microscopic     Status: Abnormal   Collection Time: 11/26/18  8:47 PM  Result Value Ref Range   Color, Urine AMBER (A) YELLOW    Comment: BIOCHEMICALS MAY BE AFFECTED BY COLOR   APPearance HAZY (A) CLEAR   Specific Gravity, Urine 1.023 1.005 - 1.030   pH 5.0 5.0 - 8.0   Glucose, UA NEGATIVE NEGATIVE mg/dL   Hgb urine dipstick NEGATIVE NEGATIVE   Bilirubin Urine NEGATIVE NEGATIVE   Ketones, ur NEGATIVE NEGATIVE mg/dL   Protein, ur NEGATIVE NEGATIVE mg/dL   Nitrite NEGATIVE NEGATIVE   Leukocytes,Ua NEGATIVE NEGATIVE    Comment: Performed at [facility], [place] [address], [place], [place] [address]  Protime-INR     Status: Abnormal   Collection Time: 11/26/18  8:53 PM  Result Value Ref Range   Prothrombin Time 72.7 (H) 11.4 - 15.2 seconds   INR 9.1 (HH) 0.8 - 1.2    Comment: REPEATED TO VERIFY SPECIMEN CHECKED FOR CLOTS CRITICAL RESULT CALLED TO, READ BACK BY AND VERIFIED WITHGillis Ends RN [phone] SHORTT  (NOTE) INR goal varies based on device and disease states. Performed at [facility], [place] [address] [place], [place] [address]    Ct Abdomen Pelvis W Contrast  Result Date: 11/26/2018 CLINICAL DATA:  Anorectal abscess EXAM: CT ABDOMEN AND PELVIS WITH CONTRAST TECHNIQUE: Multidetector CT imaging of the abdomen and pelvis was performed using the standard protocol following bolus administration of intravenous contrast. CONTRAST:  14mL OMNIPAQUE IOHEXOL 300 MG/ML  SOLN COMPARISON:  Virtual colonoscopy dated 05/04/2014 FINDINGS: Motion degraded images. Lower chest: Cardiomegaly. Pacemaker leads,  incompletely visualized. Hepatobiliary: Liver is within normal limits. Gallbladder is unremarkable. No intrahepatic or extrahepatic ductal dilatation. Pancreas: Within normal limits. Spleen: Within normal limits. Adrenals/Urinary Tract: Adrenal glands are within normal limits. Moderate right hydronephrosis with UPJ configuration, chronic. Overlying cortical thinning/scarring, chronic. Small left renal cysts, poorly evaluated due to motion degradation. Extrarenal pelvis. Bladder is within normal limits. Stomach/Bowel: Stomach is within normal limits. No evidence of bowel obstruction. Status post partial right hemicolectomy with suture line in the right mid abdomen. Appendix is not discretely visualized. Extensive left colonic diverticulosis, without evidence of diverticulitis. No discrete perianal/perirectal fluid collection/abscess. However, there are multiple foci of gas in the right perineal and right medial gluteal region (series 3/images 85 and 90). In the absence of recent surgical intervention, this is worrisome for perineal infection, Fournier's gangrene not excluded. Vascular/Lymphatic: No evidence of abdominal aortic aneurysm. Atherosclerotic calcifications of the abdominal aorta and branch vessels. No suspicious abdominopelvic lymphadenopathy. Reproductive: Status post hysterectomy. No adnexal masses. Other: No abdominopelvic ascites. Musculoskeletal: Mild degenerative changes of the visualized thoracolumbar spine. IMPRESSION: No discrete perianal/perirectal fluid collection/abscess. Multiple foci of gas in the right perineal and right medial gluteal region. In the absence of recent surgical intervention, this is worrisome for perineal infection, Fournier's gangrene not  excluded. Surgical consultation is advised. These results were called by telephone at the time of interpretation on 11/26/2018 at 10:30 pm to Dr. Isla Pence , who verbally acknowledged these results. Electronically Signed   By: Julian Hy M.D.   On: 11/26/2018 22:33   Dg Chest Port 1 View  Result Date: 11/26/2018 CLINICAL DATA:  Syncopal episode with loss of consciousness while in the waiting room of the surgical center earlier today. Indwelling biventricular pacing defibrillator. EXAM: PORTABLE CHEST 1 VIEW COMPARISON:  10/26/2018 and earlier. FINDINGS: Cardiac silhouette markedly enlarged, unchanged. Prior mitral valve replacement. LEFT subclavian biventricular pacing defibrillator unchanged. RIGHT subclavian dual lead transvenous pacemaker unchanged. Lungs clear. Bronchovascular markings normal, with calcified tracheobronchial cartilages. Pulmonary vascularity normal. No visible pleural effusions. No pneumothorax. Stable BILATERAL lower lobe bronchiectasis. IMPRESSION: Stable marked cardiomegaly. No acute cardiopulmonary disease. Electronically Signed   By: Evangeline Dakin M.D.   On: 11/26/2018 16:12    Pending Labs Unresulted Labs (From admission, onward)    Start     Ordered   11/26/18 2235  Culture, blood (Routine X 2) w Reflex to ID Panel  BLOOD CULTURE X 2,   R     11/26/18 2234          Vitals/Pain Today's Vitals   11/26/18 2030 11/26/18 2130 11/26/18 2132 11/26/18 2145  BP: (!) 100/46 99/66  (!) 100/52  Pulse: 75 77  76  Resp:  (!) 21  13  Temp:      TempSrc:      SpO2: 100% 100%  97%  PainSc:   0-No pain     Isolation Precautions No active isolations  Medications Medications  sodium chloride 0.9 % bolus 1,000 mL (0 mLs Intravenous Stopped 11/26/18 1833)    And  0.9 %  sodium chloride infusion ( Intravenous New Bag/Given 11/26/18 2005)  amiodarone (NEXTERONE PREMIX) 360-4.14 MG/200ML-% (1.8 mg/mL) IV infusion (60 mg/hr Intravenous New Bag/Given 11/26/18 2004)    Followed by  amiodarone (NEXTERONE PREMIX) 360-4.14 MG/200ML-% (1.8 mg/mL) IV infusion (has no administration in time range)  piperacillin-tazobactam (ZOSYN) IVPB 3.375 g (3.375 g Intravenous New Bag/Given 11/26/18 2236)  amiodarone  (NEXTERONE) 1.8 mg/mL load via infusion 150 mg (150 mg Intravenous Bolus from Bag 11/26/18 2004)  magnesium sulfate IVPB 2 g 50 mL (0 g Intravenous Stopped 11/26/18 2200)  iohexol (OMNIPAQUE) 300 MG/ML solution 100 mL (100 mLs Intravenous Contrast Given 11/26/18 2205)    Mobility manual wheelchair Low fall risk   Focused Assessments Denies pain/respirations unlabored.   R Recommendations: See Admitting Provider Note  Report given to:   Additional Notes:  No family present.

## 2018-11-26 NOTE — ED Notes (Signed)
Pt's son Berneice Zettlemoyer Ph [phone]. Pls contact with updates

## 2018-11-26 NOTE — H&P (Signed)
History and Physical    Angela [name] IRC:[id number] DOB: 03/02/1946 DOA: 11/26/2018  PCP: Lorene Dy, MD  Patient coming from: Surgeons office  I have personally briefly reviewed patient's old medical records in [place]  Chief Complaint: Syncope  HPI: Angela [name] is a 73 y.o. 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  History is limited from patient as she does not recall the episode and only answers with simple yes/no, therefore majority of history is obtained from EDP and chart review.    She was at the surgical center for evaluation of a perirectal infection when she syncopized in the lobby.  EMS were called and she was noted to be in V. tach which degenerated into ventricular fibrillation and received shock therapy from her ICD on 4 separate occasions with multiple episodes of NSVT per review of her ICD interrogation.  At the time of my evaluation patient is awake, following simple commands, answering yes/no verbally and by nodding head but otherwise not providing further history.  She denies current chest pain, dyspnea, abdominal pain.  She does not recall syncopal episode or that she was at her surgeon's office but does nod yes when asked about possible perirectal infection.  ED Course:  Initial vitals in the ED showed BP 125/58, pulse 84, RR 27, temp 97.8 Fahrenheit, SPO2 99% on room air.  EKG showed a V paced rhythm with multiple PVCs and episodes of bigeminy, multiple episodes of NSVT seen on telemetry.  Labs were significant for WBC 20.2, hemoglobin 15.3, platelets 299, 1, serum glucose 181, BUN 17, creatinine 0.97, magnesium 1.8, troponin I 0.16, INR 9.1, digoxin level 0.4, urinalysis negative for UTI.  Portable chest x-ray  showed ICD in place, mechanical MVR, no focal consolidation or infiltrate or effusion.  Cardiology were consulted and started patient on IV amiodarone bolus and infusion.  Patient was given 2 g IV magnesium.  Patient was given 1 L normal saline and started on maintenance IV fluids.  After discussion with patient's husband, patient was noted to have a perineal/perirectal abscess.  CT abdomen/pelvis showed multiple foci of gas concerning for possible Fournier's gangrene.  General surgery were consulted and ordered for patient to receive 10 mg IV vitamin K and transfusion of FFP.  The hospitalist service was consulted to admit.  Review of Systems:  Limited due to limited verbal communication and history of memory deficit.   Past Medical History:  Diagnosis Date  . Abnormality of gait 08/23/2014  . Arthritis   . Atrial fibrillation ([place])    A.  Chronic Coumadin  . Cardiac arrest - ventricular fibrillation    A.  12/2007;  B. 01/2008 St. Jude Promote Bi-V ICD placed  . CVA (cerebral vascular accident) ([place])   . Diverticular disease   . DVT (deep venous thrombosis) ([place])   . 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 History of colon cancer    A.  1999 - T3, N1  chemotherapy  . Internal hemorrhoids   . Memory deficit 08/23/2014  . Nonischemic cardiomyopathy ([place])    a.  11/2001 - Cath - NL Cors; b. ECHO 09/22/11: EF 20%, MVR normal, moderate LAE, mild RAE c. ECHO (08/2012):  ED 20%, diff HK, LA mod dilated, mild/mod TR, RV mild/mod decreased sys fx  . Rectal bleeding 04/28/2017  . Rheumatic heart disease    A. 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 Sick sinus syndrome ([place])    A.  s/p pacer in 1995.  B.    . Systolic CHF, chronic ([place])    A.  01/2008 Echo - EF 10-20%    Past Surgical History:  Procedure Laterality Date  . ABDOMINAL HYSTERECTOMY    . AV NODE ABLATION N/A 09/23/2011   Procedure: AV NODE ABLATION;  Surgeon: Evans Lance,  MD;  Location: [facility] CATH LAB;  Service: Cardiovascular;  Laterality: N/A;  . BIV ICD GENERTAOR CHANGE OUT N/A 09/05/2014   Procedure: BIV ICD GENERTAOR CHANGE OUT;  Surgeon: Evans Lance, MD;  Location: [facility] CATH LAB;  Service: Cardiovascular;  Laterality: N/A;  . Biventricular AICD    . COLON SURGERY  1999  . History of echocardiogram  2003, 2007, 2009  . Mitral Valve Replacement, Bjork-Shiley valve  1983     reports that she has never smoked. She has never used smokeless tobacco. She reports that she does not drink alcohol or use drugs.  No Known Allergies  Family History  Problem Relation Age of Onset  . Diabetes Mother 40  . Coronary artery disease Mother        s/p cabg  . Lung cancer Father 19       smoker  . Coronary artery disease Sister        s/p cabg     Prior to Admission medications   Medication Sig Start Date End Date Taking? Authorizing Provider  alendronate (FOSAMAX) 70 MG tablet Take 70 mg by mouth once a week. Take with a full glass of water on an empty stomach.    [provider]  carvedilol (COREG) 3.125 MG tablet Take 1 tablet (3.125 mg total) by mouth 2 (two) times daily with a meal. 03/18/18   Evans Lance, MD  cholecalciferol (VITAMIN D) 1000 UNITS tablet Take 2,000 Units by mouth daily.    [provider]  digoxin (LANOXIN) 0.125 MG tablet Take 0.125 mg by mouth daily.    [provider]  furosemide (LASIX) 40 MG tablet TAKE ONE (1) TABLET BY MOUTH EVERY DAY. IF WEIGHT IS GREATER THAN 140LBS ON HOME SCALE, TAKE 2 TABLETS (80MG ) Patient taking differently: Take 20 mg by mouth daily as needed for fluid.  02/03/17   Evans Lance, MD  lisinopril (PRINIVIL,ZESTRIL) 2.5 MG tablet Take 1 tablet (2.5 mg total) by mouth daily. 10/29/18   Debbe Odea, MD  tiZANidine (ZANAFLEX) 4 MG tablet Take 4 mg by mouth 2 (two) times daily.     [provider]  warfarin (COUMADIN) 4 MG tablet Take 2 tablets (8 mg total) by mouth daily at 6 PM.  Patient taking differently: Take 6 mg by mouth daily at 6 PM.  05/08/17   Murlean Iba, MD    Physical Exam: Vitals:   11/26/18 2030 11/26/18 2130 11/26/18 2145 11/26/18 2230  BP: (!) 100/46 99/66 (!) 100/52 (!) 106/56  Pulse: 75 77 76   Resp:  (!) 21 13 19   Temp:      TempSrc:      SpO2: 100% 100% 97%     Constitutional: Thin woman resting supine in bed, NAD, calm, comfortable Eyes: PERRL, EOMI, lids and conjunctivae normal ENMT: Mucous membranes are dry. Posterior pharynx clear of any exudate or  lesions. Neck: normal, supple, no masses. Respiratory: clear to auscultation anteriorly, no wheezing, no crackles. Normal respiratory effort. No accessory muscle use.  Cardiovascular: Irregularly irregular rhythm, no appreciable murmurs. No extremity edema.  ICD in place left chest wall. Abdomen: no tenderness, no masses palpated. No hepatosplenomegaly. Bowel sounds positive.  Musculoskeletal: no clubbing / cyanosis. No joint deformity upper and lower extremities.  Moving all extremities. Normal muscle tone.  Skin: Large right-sided open perineal abscess extending adjacently to the vagina with purulent drainage and foul-smelling odor Neurologic: CN 2-12 grossly intact.  Moving all extremities spontaneously and to command, strength appears intact, not speaking much but answering yes/no questions verbally and with nodding head and says "thank you".   Psychiatric: Flat affect   Labs on Admission: I have personally reviewed following labs and imaging studies  CBC: Recent Labs  Lab 11/26/18 1614  WBC 20.2*  NEUTROABS 18.5*  HGB 15.3*  HCT 47.5*  MCV 88.3  PLT 329   Basic Metabolic Panel: Recent Labs  Lab 11/26/18 1614  NA 134*  K 4.1  CL 99  CO2 20*  GLUCOSE 181*  BUN 17  CREATININE 0.97  CALCIUM 8.8*  MG 1.8   GFR: CrCl cannot be calculated (Unknown ideal weight.). Liver Function Tests: Recent Labs  Lab 11/26/18 1614  AST 41  ALT 44  ALKPHOS 76  BILITOT 1.9*   PROT 7.0  ALBUMIN 2.7*   No results for input(s): LIPASE, AMYLASE in the last 168 hours. No results for input(s): AMMONIA in the last 168 hours. Coagulation Profile: Recent Labs  Lab 11/26/18 2053  INR 9.1*   Cardiac Enzymes: Recent Labs  Lab 11/26/18 1614  TROPONINI 0.16*   BNP (last 3 results) No results for input(s): PROBNP in the last 8760 hours. HbA1C: No results for input(s): HGBA1C in the last 72 hours. CBG: Recent Labs  Lab 11/26/18 1617  GLUCAP 172*   Lipid Profile: No results for input(s): CHOL, HDL, LDLCALC, TRIG, CHOLHDL, LDLDIRECT in the last 72 hours. Thyroid Function Tests: No results for input(s): TSH, T4TOTAL, FREET4, T3FREE, THYROIDAB in the last 72 hours. Anemia Panel: No results for input(s): VITAMINB12, FOLATE, FERRITIN, TIBC, IRON, RETICCTPCT in the last 72 hours. Urine analysis:    Component Value Date/Time   COLORURINE AMBER (A) 11/26/2018 2047   APPEARANCEUR HAZY (A) 11/26/2018 2047   LABSPEC 1.023 11/26/2018 2047   PHURINE 5.0 11/26/2018 2047   GLUCOSEU NEGATIVE 11/26/2018 2047   HGBUR NEGATIVE 11/26/2018 2047   BILIRUBINUR NEGATIVE 11/26/2018 2047   KETONESUR NEGATIVE 11/26/2018 2047   PROTEINUR NEGATIVE 11/26/2018 2047   UROBILINOGEN 0.2 10/23/2010 2318   NITRITE NEGATIVE 11/26/2018 2047   LEUKOCYTESUR NEGATIVE 11/26/2018 2047    Radiological Exams on Admission: Ct Abdomen Pelvis W Contrast  Result Date: 11/26/2018 CLINICAL DATA:  Anorectal abscess EXAM: CT ABDOMEN AND PELVIS WITH CONTRAST TECHNIQUE: Multidetector CT imaging of the abdomen and pelvis was performed using the standard protocol following bolus administration of intravenous contrast. CONTRAST:  152mL OMNIPAQUE IOHEXOL 300 MG/ML  SOLN COMPARISON:  Virtual colonoscopy dated 05/04/2014 FINDINGS: Motion degraded images. Lower chest: Cardiomegaly. Pacemaker leads, incompletely visualized. Hepatobiliary: Liver is within normal limits. Gallbladder is unremarkable. No intrahepatic  or extrahepatic ductal dilatation. Pancreas: Within normal limits. Spleen: Within normal limits. Adrenals/Urinary Tract: Adrenal glands are within normal limits. Moderate right hydronephrosis with UPJ configuration, chronic. Overlying cortical thinning/scarring, chronic. Small left renal cysts, poorly evaluated due to motion degradation. Extrarenal pelvis. Bladder is within normal limits. Stomach/Bowel: Stomach is within  normal limits. No evidence of bowel obstruction. Status post partial right hemicolectomy with suture line in the right mid abdomen. Appendix is not discretely visualized. Extensive left colonic diverticulosis, without evidence of diverticulitis. No discrete perianal/perirectal fluid collection/abscess. However, there are multiple foci of gas in the right perineal and right medial gluteal region (series 3/images 85 and 90). In the absence of recent surgical intervention, this is worrisome for perineal infection, Fournier's gangrene not excluded. Vascular/Lymphatic: No evidence of abdominal aortic aneurysm. Atherosclerotic calcifications of the abdominal aorta and branch vessels. No suspicious abdominopelvic lymphadenopathy. Reproductive: Status post hysterectomy. No adnexal masses. Other: No abdominopelvic ascites. Musculoskeletal: Mild degenerative changes of the visualized thoracolumbar spine. IMPRESSION: No discrete perianal/perirectal fluid collection/abscess. Multiple foci of gas in the right perineal and right medial gluteal region. In the absence of recent surgical intervention, this is worrisome for perineal infection, Fournier's gangrene not excluded. Surgical consultation is advised. These results were called by telephone at the time of interpretation on 11/26/2018 at 10:30 pm to Dr. Isla Pence , who verbally acknowledged these results. Electronically Signed   By: Julian Hy M.D.   On: 11/26/2018 22:33   Dg Chest Port 1 View  Result Date: 11/26/2018 CLINICAL DATA:  Syncopal  episode with loss of consciousness while in the waiting room of the surgical center earlier today. Indwelling biventricular pacing defibrillator. EXAM: PORTABLE CHEST 1 VIEW COMPARISON:  10/26/2018 and earlier. FINDINGS: Cardiac silhouette markedly enlarged, unchanged. Prior mitral valve replacement. LEFT subclavian biventricular pacing defibrillator unchanged. RIGHT subclavian dual lead transvenous pacemaker unchanged. Lungs clear. Bronchovascular markings normal, with calcified tracheobronchial cartilages. Pulmonary vascularity normal. No visible pleural effusions. No pneumothorax. Stable BILATERAL lower lobe bronchiectasis. IMPRESSION: Stable marked cardiomegaly. No acute cardiopulmonary disease. Electronically Signed   By: Evangeline Dakin M.D.   On: 11/26/2018 16:12    EKG: Independently reviewed.  V paced rhythm, multiple PVCs with bigeminy. Telemetry reviewed and shows V paced rhythm with frequent PVCs, frequent NSVT, and bigeminy at times.  Assessment/Plan Principal Problem:   Syncope Active Problems:   Systolic CHF, chronic (HCC)   Ventricular fibrillation ([facility])   S/P Mechanical MVR (mitral valve replacement)   ICD (implantable cardioverter-defibrillator), biventricular, in situ   Elevated troponin   VT (ventricular tachycardia) ([place])   Supratherapeutic INR  Anisa Leanos is a 73 y.o. 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   Ventricular tachyarrhythmia with ventricular fibrillation s/p ICD shock x4: Rate and rhythm improving with IV amiodarone although has frequent NSVT.  Cardiology has seen patient and EP to see patient in a.m. -Continue IV amiodarone infusion  per cardiology -Appreciate further cardiology and EP recommendations -Admit to PCU, monitor telemetry, repeat EKG in a.m. -Monitor magnesium level in a.m., replete as needed to keep >2 -Home meds on hold for now  Right perineal/perirectal abscess with presence of gas/possible Fournier's gangrene: General surgery consulted, will eventually need drainage under general anesthesia with cardiology clearance. -Obtain blood cultures -Start IV vancomycin and Zosyn per pharmacy consultation -INR reversal already ordered by surgery  Supratherapeutic INR, S/p mechanical MVR: INR 9.1 on admission in setting of chronic Coumadin use. -IV vitamin K 10 mg and FFP transfusion ordered by general surgery -Recheck INR in a.m. -Holding Coumadin  Elevated troponin: Troponin I 0.16 on admission, likely due to tachyarrhythmia.  Will continue to trend.  HFrEF: Echocardiogram 10/27/2018 showed EF 15-20% with diffuse LV hypokinesis and severely dilated LV, right atrium, left atrium.  Volume status appears depleted on admission. -Continue gentle IV fluids overnight -Strict I/O's and daily weights -Holding home Coreg and Lasix for now   DVT prophylaxis: SCDs Code Status: Full code Family Communication: Attempted to call patient's husband without answer Disposition Plan: Pending clinical progress, management of ventricular tachyarrhythmia, supratherapeutic INR, and perineal/perirectal abscess Consults called: Cardiology, general surgery consulted in ED.  EP to see in a.m. per cardiology documentation. Admission status: Inpatient, patient requires greater than 2 midnight length stay due to complicated management of ventricular tachyarrhythmia and perineal/perirectal abscess, need for IV antibiotic therapy, reversal of supratherapeutic INR as she is high risk for further decompensation.   Zada Finders MD [facility] Pager [phone]  If 7PM-7AM, please contact night-coverage [email or web address]   11/26/2018, 11:38 PM

## 2018-11-26 NOTE — ED Notes (Signed)
Dr. Gilford Raid ( EDP) notified on elevated INR result .

## 2018-11-26 NOTE — Consult Note (Addendum)
Cardiology Consultation:   Patient ID: Angela [name]; [id number]; 20-Apr-1946   Admit date: 11/26/2018 Date of Consult: 11/26/2018  Primary Care Provider: Lorene Dy, MD Primary Cardiologist: Cristopher Peru, MD 11/05/2018 Primary Electrophysiologist:  None   Patient Profile:   Angela [name] is a 73 y.o. female with a hx of NICM, S-CHF, mech MVR, LBBB, hx VF arrest>>ROSC>>STJ ICD, A. fib, chronic Coumadin, PAD, who is being seen today for the evaluation of VT/VF, syncope at the request of Dr Gilford Raid.  History of Present Illness:   Ms. Baysinger very frail.  She is oriented to name and place.    She denies chest pain or shortness of breath.  She denies any palpitations.  She does not remember the syncopal episode.  She does not remember feeling the ICD fire.  She states she was at the doctor's office because of her hand and when asked what was wrong with it, stated she could not see.  Information was obtained from staff, chart notes, and other records.  Per EMS reports, the patient was at the surgical center and had a syncopal episode.  Staff felt her defibrillator fire.  EMS was called who noted V. tach on the monitor and her defibrillator fired again.  Her device was interrogated and the report is reviewed.  Since 11/05/2018, she has had 4 VF episodes with 4 shocks delivered.  She had 8 other nonsustained episodes.  The episodes have been increasing in frequency since 3/28, with all of the VF and 3 of the nonsustained episodes occurring today.  She has not been coughing and denies fever.  She denies any dysuria.  She denies any bleeding issues.  She is extremely weak at baseline and basically is bedbound by her description.  She does have a walker.  Her husband cares for her.   Past Medical History:  Diagnosis Date  . Abnormality of gait 08/23/2014  . Arthritis   . Atrial fibrillation ([place])    A.  Chronic Coumadin  . Cardiac arrest - ventricular fibrillation    A.  12/2007;  B.  01/2008 St. Jude Promote Bi-V ICD placed  . CVA (cerebral vascular accident) ([place])   . Diverticular disease   . DVT (deep venous thrombosis) ([place])   . 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 History of colon cancer    A.  1999 - T3, N1  chemotherapy  . Internal hemorrhoids   . Memory deficit 08/23/2014  . Nonischemic cardiomyopathy ([place])    a.  11/2001 - Cath - NL Cors; b. ECHO 09/22/11: EF 20%, MVR normal, moderate LAE, mild RAE c. ECHO (08/2012): ED 20%, diff HK, LA mod dilated, mild/mod TR, RV mild/mod decreased sys fx  . Rectal bleeding 04/28/2017  . Rheumatic heart disease    A. 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 Sick sinus syndrome ([place])    A.  s/p pacer in 1995.  B.    . Systolic CHF, chronic ([place])    A.  01/2008 Echo - EF 10-20%    Past Surgical History:  Procedure Laterality Date  . ABDOMINAL HYSTERECTOMY    . AV NODE ABLATION N/A 09/23/2011   Procedure: AV NODE ABLATION;  Surgeon: Evans Lance, MD;  Location: [facility] CATH LAB;  Service: Cardiovascular;  Laterality: N/A;  . BIV ICD GENERTAOR CHANGE OUT N/A 09/05/2014   Procedure: BIV ICD GENERTAOR CHANGE OUT;  Surgeon: Evans Lance, MD;  Location: [facility] CATH LAB;  Service: Cardiovascular;  Laterality: N/A;  . Biventricular AICD    . COLON SURGERY  1999  . History of echocardiogram  2003, 2007, 2009  . Mitral Valve Replacement, Bjork-Shiley valve  1983     Prior to Admission medications   Medication Sig Start Date End Date Taking? Authorizing Provider  alendronate (FOSAMAX) 70 MG tablet Take 70 mg by mouth once a week. Take with a full glass of water on an empty stomach.    [provider]  carvedilol (COREG) 3.125 MG tablet Take 1 tablet (3.125 mg total) by mouth 2 (two) times daily with a meal. 03/18/18   Evans Lance, MD  cholecalciferol (VITAMIN D) 1000 UNITS tablet Take 2,000 Units by mouth daily.    [provider]  digoxin (LANOXIN) 0.125 MG  tablet Take 0.125 mg by mouth daily.    [provider]  furosemide (LASIX) 40 MG tablet TAKE ONE (1) TABLET BY MOUTH EVERY DAY. IF WEIGHT IS GREATER THAN 140LBS ON HOME SCALE, TAKE 2 TABLETS (80MG ) Patient taking differently: Take 20 mg by mouth daily as needed for fluid.  02/03/17   Evans Lance, MD  lisinopril (PRINIVIL,ZESTRIL) 2.5 MG tablet Take 1 tablet (2.5 mg total) by mouth daily. 10/29/18   Debbe Odea, MD  tiZANidine (ZANAFLEX) 4 MG tablet Take 4 mg by mouth 2 (two) times daily.     [provider]  warfarin (COUMADIN) 4 MG tablet Take 2 tablets (8 mg total) by mouth daily at 6 PM. Patient taking differently: Take 6 mg by mouth daily at 6 PM.  05/08/17   Murlean Iba, MD    Inpatient Medications: Scheduled Meds:  Continuous Infusions: . sodium chloride 125 mL/hr at 11/26/18 2005  . amiodarone 60 mg/hr (11/26/18 2004)   Followed by  . [START ON 11/27/2018] amiodarone     PRN Meds:   Allergies:   No Known Allergies  Social History:   Social History   Socioeconomic History  . Marital status: Married    Spouse name: Not on file  . Number of children: 1  . Years of education: Not on file  . Highest education level: Not on file  Occupational History  . Occupation: Retired    Fish farm manager: RETIRED  Social Needs  . Financial resource strain: Not on file  . Food insecurity:    Worry: Not on file    Inability: Not on file  . Transportation needs:    Medical: Not on file    Non-medical: Not on file  Tobacco Use  . Smoking status: Never Smoker  . Smokeless tobacco: Never Used  Substance and Sexual Activity  . Alcohol use: No  . Drug use: No  . Sexual activity: Never  Lifestyle  . Physical activity:    Days per week: Not on file    Minutes per session: Not on file  . Stress: Not on file  Relationships  . Social connections:    Talks on phone: Not on file    Gets together: Not on file    Attends religious service: Not on file    Active  member of club or organization: Not on file    Attends meetings of clubs or organizations: Not on file    Relationship status: Not on file  . Intimate partner violence:    Fear of current or ex partner: Not on file    Emotionally abused: Not on file    Physically abused: Not  on file    Forced sexual activity: Not on file  Other Topics Concern  . Not on file  Social History Narrative   The patient is married and has 1 child.  Lives in [place] with husband.  She is retired from working at [employer]  She does not smoke or drink    Family History:   Family History  Problem Relation Age of Onset  . Diabetes Mother 28  . Coronary artery disease Mother        s/p cabg  . Lung cancer Father 22       smoker  . Coronary artery disease Sister        s/p cabg   Family Status:  Family Status  Relation Name Status  . Mother  Alive       CAD s/p CABG, DM2  . Father  Deceased       Lung cancer  . Sister  Alive  . Brother  Alive  . Sister  Alive  . Sister  Alive  . Sister  Alive  . Sister  Deceased       Killed  . Brother  Alive  . Brother  Deceased       MVA  . Brother  Deceased       unknown    ROS:  Please see the history of present illness.  All other ROS reviewed and negative.     Physical Exam/Data:   Vitals:   11/26/18 1800 11/26/18 1815 11/26/18 1830 11/26/18 1900  BP: (!) 130/97 109/64 (!) 124/54 (!) 108/55  Pulse:    74  Resp:   (!) 21 19  Temp:      TempSrc:      SpO2: 98% 100% 100% 99%    Intake/Output Summary (Last 24 hours) at 11/26/2018 2024 Last data filed at 11/26/2018 1833 Gross per 24 hour  Intake 1000 ml  Output -  Net 1000 ml   There were no vitals filed for this visit. There is no height or weight on file to calculate BMI.  General: Frail, elderly female, in no acute distress HEENT: She is thin and skin appears dry. Lymph: no adenopathy Neck: no JVD Endocrine:  No thryomegaly Vascular: No carotid bruits; 4/4 extremity pulses 1-2+,  without bruits  Cardiac:  normal S1, S2; RRR; mechanical valve click and soft murmur noted Lungs: Decreased breath sounds bases bilaterally, no wheezing, rhonchi  Abd: soft, nontender, no hepatomegaly  Ext: no edema Musculoskeletal:  No deformities, BUE and BLE strength strongly weak but equal Skin: warm and dry  Neuro:  CNs 2-12 intact, no focal abnormalities noted Psych:  Normal affect   EKG:  The EKG was personally reviewed and demonstrates: A. fib with V pacing, left bundle noted, frequent PVCs and bigeminy at times Telemetry:  Telemetry was personally reviewed and demonstrates: Atrial fib, V pacing, very frequent PVCs and short runs of nonsustained VT  Relevant CV Studies:  ECHO: 10/27/2018  1. The left ventricle has a visually estimated ejection fraction of of 15-20%. The cavity size was severely dilated. Left ventricular diastolic Doppler parameters are indeterminate due to mitral valve replacement/repair. Left ventricular diffuse  hypokinesis.  2. The right ventricle has moderately reduced systolic function. The cavity was normal. There is no increase in right ventricular wall thickness.  3. Left atrial size was severely dilated.  4. Right atrial size was severely dilated.  5. A mechanical valve is present in the mitral position. Procedure Date: 77.  6. Bjork-Shiley mechanical mitral valve prosthesis functioning properly.  7. The tricuspid valve is normal in structure.  8. The aortic valve is normal in structure.  9. The pulmonic valve was normal in structure. Pulmonic valve regurgitation is mild by color flow Doppler. 10. The inferior vena cava was normal in size with <50% respiratory variability.  FINDINGS  Left Ventricle: The left ventricle has a visually estimated ejection fraction of of 15-20%. The cavity size was severely dilated. There is no increase in left ventricular wall thickness. Left ventricular diastolic Doppler parameters are indeterminate  due to mitral valve  replacement/repair. Left ventricular diffuse hypokinesis. Right Ventricle: The right ventricle has moderately reduced systolic function. The cavity was normal. There is no increase in right ventricular wall thickness. Pacing wire/catheter visualized in the right ventricle. Left Atrium: left atrial size was severely dilated Right Atrium: right atrial size was severely dilated. Right atrial pressure is estimated at 8 mmHg. Interatrial Septum: No atrial level shunt detected by color flow Doppler. Pericardium: There is no evidence of pericardial effusion. Mitral Valve: The mitral valve has been repaired/replaced. Mitral valve regurgitation is mild by color flow Doppler. A mechanical valve is present in the mitral position. Procedure Date: 48. Bjork-Shiley mechanical mitral valve prosthesis functioning  properly. Tricuspid Valve: The tricuspid valve is normal in structure. Tricuspid valve regurgitation is mild by color flow Doppler. Aortic Valve: The aortic valve is normal in structure. Aortic valve regurgitation was not visualized by color flow Doppler. Pulmonic Valve: The pulmonic valve was normal in structure. Pulmonic valve regurgitation is mild by color flow Doppler. Venous: The inferior vena cava is normal in size with less than 50% respiratory variability.   CATH: 11/26/2001 CORONARY CINE ANGIOGRAPHY:  Left coronary artery:  The ostium and left main appeared normal.  Left anterior descending:  The left anterior descending appears normal.  Circumflex coronary artery:  The circumflex coronary artery appears normal.  Right coronary artery:  The right coronary artery appears normal.  LEFT VENTRICULAR CINEANGIOGRAM:  The left ventricular chamber size is mild to moderate to severely enlarged.  The left ventricular contractility is severely decreased.  There is a discoordinate contractility probably due to pacemaker activity originating in the right ventricle.  There is generalized  hypokinesia and an overall ejection fraction is severely decreased at approximately 15-20%.  There is a Bjork-Shiley prosthetic mitral valve which has moderate rocking of its supporting structure, however, there is no significant mitral insufficiency noted.  There was a trace seen.  The valve appears to have very normal mobility and opening during diastole.  FINAL DIAGNOSES: 1. Normal coronary arteries. 2. Severe dilated cardiomyopathy with severe left ventricular hypokinesia    with discoordinate contractility secondary to pacing and ejection fraction    estimated at between 15-20%. 3. Successful Perclose of the right femoral artery.  Laboratory Data:  Chemistry Recent Labs  Lab 11/26/18 1614  NA 134*  K 4.1  CL 99  CO2 20*  GLUCOSE 181*  BUN 17  CREATININE 0.97  CALCIUM 8.8*  GFRNONAA 58*  GFRAA >60  ANIONGAP 15    Lab Results  Component Value Date   ALT 44 11/26/2018   AST 41 11/26/2018   ALKPHOS 76 11/26/2018   BILITOT 1.9 (H) 11/26/2018   Hematology Recent Labs  Lab 11/26/18 1614  WBC 20.2*  RBC 5.38*  HGB 15.3*  HCT 47.5*  MCV 88.3  MCH 28.4  MCHC 32.2  RDW 15.0  PLT 299   Cardiac Enzymes Recent Labs  Lab 11/26/18 1614  TROPONINI 0.16*   No results for input(s): TROPIPOC in the last 168 hours.   TSH:  Lab Results  Component Value Date   TSH 1.260 08/23/2014   Magnesium:  Magnesium  Date Value Ref Range Status  11/26/2018 1.8 1.7 - 2.4 mg/dL Final    Comment:    Performed at [facility], [place] [address] [place], [place] [address]    Radiology/Studies:  Dg Chest Port 1 View  Result Date: 11/26/2018 CLINICAL DATA:  Syncopal episode with loss of consciousness while in the waiting room of the surgical center earlier today. Indwelling biventricular pacing defibrillator. EXAM: PORTABLE CHEST 1 VIEW COMPARISON:  10/26/2018 and earlier. FINDINGS: Cardiac silhouette markedly enlarged, unchanged. Prior mitral valve replacement. LEFT  subclavian biventricular pacing defibrillator unchanged. RIGHT subclavian dual lead transvenous pacemaker unchanged. Lungs clear. Bronchovascular markings normal, with calcified tracheobronchial cartilages. Pulmonary vascularity normal. No visible pleural effusions. No pneumothorax. Stable BILATERAL lower lobe bronchiectasis. IMPRESSION: Stable marked cardiomegaly. No acute cardiopulmonary disease. Electronically Signed   By: Evangeline Dakin M.D.   On: 11/26/2018 16:12    Assessment and Plan:   Principal Problem: 1.  Syncope 2. Atrial fibrillation 3. VT/VF 4. Chronic systolic heart failure  (see below as per MD)  A/P: (see below as per MD)   For questions or updates, please contact [place] Please consult [email or web address] for contact info under Cardiology/STEMI.   Signed, Rosaria Ferries, PA-C  11/26/2018 8:24 PM  The patient was seen and examined, and I agree with the history, physical exam, assessment as documented above, with plan and modifications as noted below. I have also personally reviewed all relevant documentation, old records, labs, and both radiographic and cardiovascular studies. I have also independently interpreted old and new ECG's.  Briefly, this is a 73 year old woman who most recently saw Dr. Lovena Le on 11/05/2018.  She has a history of a nonischemic cardiomyopathy, atrial fibrillation, chronic systolic heart failure, mitral valve replacement, ventricular fibrillation arrest, left bundle branch block status post biventricular ICD implantation.  She had a recent hospitalization for decompensated heart failure in early March and had been noted to have recurrent runs of NSVT but no shock therapies at the time of that office visit.  At the time of my evaluation, the patient was unable to provide much by way of history and most of it has been obtained by record review.  She was reportedly at the doctor's office and sustained a syncopal episode.  She was found to be in  ventricular tachyarrhythmia (multiple runs of nonsustained ventricular tachycardia degenerating into ventricular fibrillation) and received shock therapies on 4 separate occasions.  She does not recall these events when I asked her about it.  She currently denies chest pain, palpitations, shortness of breath, fevers, cough, and dysuria.  However, at the present time I am not certain about her ability to answer these questions accurately.  Pertinent labs: Low digoxin level of 0.4, nonspecific troponin elevation of 0.16, low normal magnesium level of 1.8, potassium 4.1, albumin 2.7, total bilirubin 1.9, leukocytosis with white blood cells of 20.2.  EGM's reviewed in detail.  Chest x-ray without acute cardiopulmonary disease.  I will start an IV amiodarone bolus and infusion.  I discussed this with the ED attending, Dr. Gilford Raid.  I will provide magnesium to achieve levels greater than 2.  She has some degree of hemoconcentration which could be contributing to some degree of her leukocytosis but not entirely.  I recommend gentle IV hydration.  I recommend checking a urinalysis to evaluate for an infectious etiology.  EP to see on 11/27/2018.  Kate Sable, MD, [facility]  11/26/2018 8:47 PM   Time spent: 60 minutes

## 2018-11-27 DIAGNOSIS — R791 Abnormal coagulation profile: Secondary | ICD-10-CM

## 2018-11-27 DIAGNOSIS — Z952 Presence of prosthetic heart valve: Secondary | ICD-10-CM

## 2018-11-27 LAB — CBC WITH DIFFERENTIAL/PLATELET
Abs Immature Granulocytes: 0.25 10*3/uL — ABNORMAL HIGH (ref 0.00–0.07)
Basophils Absolute: 0 10*3/uL (ref 0.0–0.1)
Basophils Relative: 0 %
Eosinophils Absolute: 0 10*3/uL (ref 0.0–0.5)
Eosinophils Relative: 0 %
HCT: 35.6 % — ABNORMAL LOW (ref 36.0–46.0)
Hemoglobin: 11.5 g/dL — ABNORMAL LOW (ref 12.0–15.0)
Immature Granulocytes: 1 %
Lymphocytes Relative: 4 %
Lymphs Abs: 0.7 10*3/uL (ref 0.7–4.0)
MCH: 28.5 pg (ref 26.0–34.0)
MCHC: 32.3 g/dL (ref 30.0–36.0)
MCV: 88.1 fL (ref 80.0–100.0)
Monocytes Absolute: 0.5 10*3/uL (ref 0.1–1.0)
Monocytes Relative: 3 %
Neutro Abs: 16.9 10*3/uL — ABNORMAL HIGH (ref 1.7–7.7)
Neutrophils Relative %: 92 %
Platelets: 240 10*3/uL (ref 150–400)
RBC: 4.04 MIL/uL (ref 3.87–5.11)
RDW: 14.8 % (ref 11.5–15.5)
WBC: 18.4 10*3/uL — ABNORMAL HIGH (ref 4.0–10.5)
nRBC: 0 % (ref 0.0–0.2)

## 2018-11-27 LAB — BASIC METABOLIC PANEL
Anion gap: 9 (ref 5–15)
Anion gap: 12 (ref 5–15)
BUN: 8 mg/dL (ref 8–23)
BUN: 9 mg/dL (ref 8–23)
CO2: 23 mmol/L (ref 22–32)
CO2: 22 mmol/L (ref 22–32)
Calcium: 7.8 mg/dL — ABNORMAL LOW (ref 8.9–10.3)
Calcium: 8 mg/dL — ABNORMAL LOW (ref 8.9–10.3)
Chloride: 106 mmol/L (ref 98–111)
Chloride: 100 mmol/L (ref 98–111)
Creatinine, Ser: 0.7 mg/dL (ref 0.44–1.00)
Creatinine, Ser: 0.76 mg/dL (ref 0.44–1.00)
GFR calc Af Amer: >60 mL/min (ref >60)
GFR calc Af Amer: >60 mL/min (ref >60)
GFR calc non Af Amer: >60 mL/min (ref >60)
GFR calc non Af Amer: >60 mL/min (ref >60)
Glucose, Bld: 93 mg/dL (ref 70–99)
Glucose, Bld: 115 mg/dL — ABNORMAL HIGH (ref 70–99)
Potassium: 3 mmol/L — ABNORMAL LOW (ref 3.5–5.1)
Potassium: 3.2 mmol/L — ABNORMAL LOW (ref 3.5–5.1)
Sodium: 138 mmol/L (ref 135–145)
Sodium: 134 mmol/L — ABNORMAL LOW (ref 135–145)

## 2018-11-27 LAB — GLUCOSE, CAPILLARY
Glucose-Capillary: 100 mg/dL — ABNORMAL HIGH (ref 70–99)
Glucose-Capillary: 85 mg/dL (ref 70–99)
Glucose-Capillary: 93 mg/dL (ref 70–99)
Glucose-Capillary: 97 mg/dL (ref 70–99)

## 2018-11-27 LAB — TROPONIN I
Troponin I: 0.4 ng/mL (ref <0.03)
Troponin I: 0.3 ng/mL (ref <0.03)

## 2018-11-27 LAB — PROTIME-INR
INR: 9.1 (ref 0.8–1.2)
INR: 1.4 — ABNORMAL HIGH (ref 0.8–1.2)
Prothrombin Time: 72.7 seconds — ABNORMAL HIGH (ref 11.4–15.2)
Prothrombin Time: 16.8 seconds — ABNORMAL HIGH (ref 11.4–15.2)

## 2018-11-27 LAB — MRSA PCR SCREENING: MRSA by PCR: NEGATIVE

## 2018-11-27 LAB — MAGNESIUM
Magnesium: 1.9 mg/dL (ref 1.7–2.4)
Magnesium: 2.1 mg/dL (ref 1.7–2.4)

## 2018-11-27 MED ORDER — INSULIN ASPART 100 UNIT/ML ~~LOC~~ SOLN
0.0000 [IU] | SUBCUTANEOUS | Status: DC
  Administered 2018-11-30 – 2018-12-08 (×6): 1 [IU] via SUBCUTANEOUS

## 2018-11-27 MED ORDER — POTASSIUM CHLORIDE CRYS ER 20 MEQ PO TBCR
40.0000 meq | EXTENDED_RELEASE_TABLET | ORAL | Status: AC
  Administered 2018-11-27: 40 meq via ORAL
  Filled 2018-11-27: qty 2

## 2018-11-27 MED ORDER — ORAL CARE MOUTH RINSE
15.0000 mL | Freq: Two times a day (BID) | OROMUCOSAL | Status: DC
  Administered 2018-11-27 – 2018-11-30 (×7): 15 mL via OROMUCOSAL

## 2018-11-27 MED ORDER — AMIODARONE IV BOLUS ONLY 150 MG/100ML
150.0000 mg | Freq: Once | INTRAVENOUS | Status: AC
  Administered 2018-11-27: 150 mg via INTRAVENOUS
  Filled 2018-11-27: qty 100

## 2018-11-27 NOTE — Progress Notes (Signed)
Obtained administrative approval for 4 family members to visit with patient around 6pm tonight for an hour. They will come to main entrance and someone will escort them to the [facility] room to see their loved one.---JM

## 2018-11-27 NOTE — Progress Notes (Signed)
Inpatient Diabetes Program Recommendations  AACE/ADA: New Consensus Statement on Inpatient Glycemic Control (2015)  Target Ranges:  Prepandial:   less than 140 mg/dL      Peak postprandial:   less than 180 mg/dL (1-2 hours)      Critically ill patients:  140 - 180 mg/dL   Lab Results  Component Value Date   GLUCAP 134 (H) 11/26/2018    Review of Glycemic Control Results for Angela [name], Angela [name] (MRN [id number]) as of 11/27/2018 10:13  Ref. Range 11/26/2018 16:17 11/26/2018 23:47  Glucose-Capillary Latest Ref Range: 70 - 99 mg/dL 172 (H) 134 (H)   Diabetes history: No DM hx Outpatient Diabetes medications: none Current orders for Inpatient glycemic control: none  Inpatient Diabetes Program Recommendations:    No history of previous A1C, consider adding on A1C in setting of abscess and previous inpt glucoses.   Additionally, may want to consider adding Novolog 0-9 units TID & Hs.   Thanks, Bronson Curb, MSN, RNC-OB Diabetes Coordinator [phone] (8a-5p)

## 2018-11-27 NOTE — Consult Note (Signed)
Cardiology Consultation:   Patient ID: Angela [name] MRN: [id number]; DOB: May 22, 1946  Admit date: 11/26/2018 Date of Consult: 11/27/2018  Primary Care Provider: Lorene Dy, MD Primary Cardiologist/Electrophysiologist: Cristopher Peru, MD     Patient Profile:   Angela [name] is a 73 y.o. 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 Erlinda Hong.   Device information: SJM CRT- ICD, implanted 2009, gen change 09/05/14 Patient has an abandoned pacing device system by CXR . History of Present Illness:   Angela [name] was at her surgeon's office yesterday when she sufered a syncopal event, associated with ICD shocks.  By record, the patient was at the surgical center and had a syncopal episode.  Staff felt her defibrillator fire.  EMS was called who noted V. tach on the monitor and her defibrillator fired again.  She is admitted to medicine service.  She was at the surgeon's office for evaluation of a perirectal infection, CT noted showed multiple foci of gas concerning for possible Fournier's gangrene, and surgical service was consulted to the case here as well.  Her INR toxic at 9 > VitK and FFP per medicine/surgical service.   cardiology consult note describes extremely weak at baseline, basically bed bound, has walker, husband cares for her.  She last saw Dr. Lovena Le march 2020, noted some NSVT episodes, no therapies.  LABS (none today yet) K+ 4.1 Mag 1.8 (mag was given) BUN/Creat 17/0.97 Trop I:  0.16, 0.40 WBC 20.2 H/H 15/47 Plts 299 INR 9.1  Device interrogation (MOD) battery and lead measurements are good (no LV threshold completed) Base programming VVIR 70bpm (Patient has record of AVNode  ablation though has R waves 60's) The patient has had 8 NSVT events since march 13 to yesterday, 2 are PMVT Yesterday 4 true VF events, all successfully terminated with one shock each  Past Medical History:  Diagnosis Date   Abnormality of gait 08/23/2014   Arthritis    Atrial fibrillation ([place])    A.  Chronic Coumadin   Cardiac arrest - ventricular fibrillation    A.  12/2007;  B. 01/2008 St. 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  12/2001 iliofemoral embolus s/p r fem embolectomy   History of colon cancer    A.  1999 - T3, N1  chemotherapy   Internal hemorrhoids    Memory deficit 08/23/2014   Nonischemic cardiomyopathy ([place])    a.  11/2001 - Cath - NL Cors; b. ECHO 09/22/11: EF 20%, MVR normal, moderate LAE, mild RAE c. ECHO (08/2012): ED 20%, diff HK, LA mod dilated, mild/mod TR, RV mild/mod decreased sys fx   Rectal bleeding 04/28/2017   Rheumatic heart disease    A. 1983 s/p  Bjork-Shiley MVR   Sick sinus syndrome ([place])    A.  s/p pacer in 1995.  B.     Systolic CHF, chronic ([place])    A.  01/2008 Echo - EF 10-20%    Past Surgical History:  Procedure Laterality Date   ABDOMINAL HYSTERECTOMY     AV  NODE ABLATION N/A 09/23/2011   Procedure: AV NODE ABLATION;  Surgeon: Evans Lance, MD;  Location[facility] C[facility] LAB;  Service: Cardiovascular;  Laterality: N/A;   BIV ICD GENERTAOR CHANGE OUT N/A 09/05/2014   Procedure: BIV ICD GENERTAOR CHANGE OUT;  Surgeon: Evans Lance, MD;  Location: [facility] CATH LAB;  Service: Cardiovascular;  Laterality: N/A;   Biventricular AICD     COLON SURGERY  1999   History of echocardiogram  2003, 2007, 2009   Mitral Valve Replacement, Bjork-Shiley valve  1983     Home Medications:  Prior to Admission medications   Medication Sig Start Date End Date Taking? Authorizing Provider  alendronate  (FOSAMAX) 70 MG tablet Take 70 mg by mouth once a week. Take with a full glass of water on an empty stomach.    [provider]  carvedilol (COREG) 3.125 MG tablet Take 1 tablet (3.125 mg total) by mouth 2 (two) times daily with a meal. 03/18/18   Evans Lance, MD  cholecalciferol (VITAMIN D) 1000 UNITS tablet Take 2,000 Units by mouth daily.    [provider]  digoxin (LANOXIN) 0.125 MG tablet Take 0.125 mg by mouth daily.    [provider]  furosemide (LASIX) 40 MG tablet TAKE ONE (1) TABLET BY MOUTH EVERY DAY. IF WEIGHT IS GREATER THAN 140LBS ON HOME SCALE, TAKE 2 TABLETS (80MG ) Patient taking differently: Take 20 mg by mouth daily as needed for fluid.  02/03/17   Evans Lance, MD  lisinopril (PRINIVIL,ZESTRIL) 2.5 MG tablet Take 1 tablet (2.5 mg total) by mouth daily. 10/29/18   Debbe Odea, MD  tiZANidine (ZANAFLEX) 4 MG tablet Take 4 mg by mouth 2 (two) times daily.     [provider]  warfarin (COUMADIN) 4 MG tablet Take 2 tablets (8 mg total) by mouth daily at 6 PM. Patient taking differently: Take 6 mg by mouth daily at 6 PM.  05/08/17   Murlean Iba, MD    Inpatient Medications: Scheduled Meds:  mouth rinse  15 mL Mouth Rinse BID   sodium chloride flush  3 mL Intravenous Q12H   Continuous Infusions:  sodium chloride 100 mL/hr at 11/27/18 0515   amiodarone 30 mg/hr (11/27/18 0652)   piperacillin-tazobactam (ZOSYN)  IV 3.375 g (11/27/18 0521)   vancomycin     PRN Meds: acetaminophen **OR** acetaminophen  Allergies:   No Known Allergies  Social History:   Social History   Socioeconomic History   Marital status: Married    Spouse name: Not on file   Number of children: 1   Years of education: Not on file   Highest education level: Not on file  Occupational History   Occupation: Retired    Fish farm manager: RETIRED  Scientist, product/process development strain: Not on file   Food insecurity:    Worry: Not on file     Inability: Not on file   Transportation needs:    Medical: Not on file    Non-medical: Not on file  Tobacco Use   Smoking status: Never Smoker   Smokeless tobacco: Never Used  Substance and Sexual Activity   Alcohol use: No   Drug use: No   Sexual activity: Never  Lifestyle   Physical activity:    Days per week: Not on file    Minutes per session: Not on file   Stress: Not on file  Relationships   Social connections:    Talks on phone: Not on file  Gets together: Not on file    Attends religious service: Not on file    Active member of club or organization: Not on file    Attends meetings of clubs or organizations: Not on file    Relationship status: Not on file   Intimate partner violence:    Fear of current or ex partner: Not on file    Emotionally abused: Not on file    Physically abused: Not on file    Forced sexual activity: Not on file  Other Topics Concern   Not on file  Social History Narrative   The patient is married and has 1 child.  Lives in [place] with husband.  She is retired from working at [employer].  She does not smoke or drink    Family History:  Family History  Problem Relation Age of Onset   Diabetes Mother 31   Coronary artery disease Mother        s/p cabg   Lung cancer Father 76       smoker   Coronary artery disease Sister        s/p cabg     ROS:  Please see the history of present illness.  All other ROS reviewed and negative.     Physical Exam/Data:   Vitals:   11/27/18 0500 11/27/18 0614 11/27/18 0629 11/27/18 0650  BP:  113/76 113/76 100/64  Pulse:   76   Resp:  (!) 23 19 20   Temp:  98.3 F (36.8 C) 98.3 F (36.8 C) 97.7 F (36.5 C)  TempSrc:  Oral Oral Oral  SpO2:  100% 100% 100%  Weight: 60.7 kg     Height:        Intake/Output Summary (Last 24 hours) at 11/27/2018 0728 Last data filed at 11/27/2018 6269 Gross per 24 hour  Intake 4111.35 ml  Output --  Net 4111.35 ml   Last 3 Weights 11/27/2018  11/26/2018 11/05/2018  Weight (lbs) 133 lb 13.1 oz 133 lb 13.1 oz 143 lb 3.2 oz  Weight (kg) 60.7 kg 60.7 kg 64.955 kg     Body mass index is 21.6 kg/m.    LIMITED EXAM  General:  Well nourished, well developed, in no acute distress HEENT: normal Lymph: no adenopathy Neck: no JVD Endocrine:  No thryomegaly Vascular: No carotid bruitsithout bruits  Cardiac: RRR; mechanical mitral valve sounds appreciated, no murmurs Lungs:   CTA b/l, no wheezing, rhonchi or rales and no increased work of breathing, regular respiratory rate Abd: soft, nontender Ext: no edema Musculoskeletal:  No deformities, age appropriate atrophy Skin: warm and dry  Neuro:   no gross focal abnormalities noted she was sleeping on my arrival  EKG:  The EKG was personally reviewed and demonstrates:    #1 is AFib, looks like paced as well as intrinsic beats, PVCs #2 (today) mostly V pacing, some intrinsic beats as well.  Telemetry:  Telemetry was personally reviewed and demonstrates:  Atrial fib with PVC"s  Relevant CV Studies:  10/27/2018: TTE IMPRESSIONS  1. The left ventricle has a visually estimated ejection fraction of of 15-20%. The cavity size was severely dilated. Left ventricular diastolic Doppler parameters are indeterminate due to mitral valve replacement/repair. Left ventricular diffuse  hypokinesis.  2. The right ventricle has moderately reduced systolic function. The cavity was normal. There is no increase in right ventricular wall thickness.  3. Left atrial size was severely dilated.  4. Right atrial size was severely dilated.  5. A mechanical  valve is present in the mitral position. Procedure Date: 1983.  6. Bjork-Shiley mechanical mitral valve prosthesis functioning properly.  7. The tricuspid valve is normal in structure.  8. The aortic valve is normal in structure.  9. The pulmonic valve was normal in structure. Pulmonic valve regurgitation is mild by color flow Doppler. 10. The inferior vena  cava was normal in size with <50% respiratory variability.  FINDINGS  Left Ventricle: The left ventricle has a visually estimated ejection fraction of of 15-20%. The cavity size was severely dilated. There is no increase in left ventricular wall thickness. Left ventricular diastolic Doppler parameters are indeterminate  due to mitral valve replacement/repair. Left ventricular diffuse hypokinesis. Right Ventricle: The right ventricle has moderately reduced systolic function. The cavity was normal. There is no increase in right ventricular wall thickness. Pacing wire/catheter visualized in the right ventricle. Left Atrium: left atrial size was severely dilated Right Atrium: right atrial size was severely dilated. Right atrial pressure is estimated at 8 mmHg. Interatrial Septum: No atrial level shunt detected by color flow Doppler. Pericardium: There is no evidence of pericardial effusion. Mitral Valve: The mitral valve has been repaired/replaced. Mitral valve regurgitation is mild by color flow Doppler. A mechanical valve is present in the mitral position. Procedure Date: 89. Bjork-Shiley mechanical mitral valve prosthesis functioning  properly. Tricuspid Valve: The tricuspid valve is normal in structure. Tricuspid valve regurgitation is mild by color flow Doppler. Aortic Valve: The aortic valve is normal in structure. Aortic valve regurgitation was not visualized by color flow Doppler. Pulmonic Valve: The pulmonic valve was normal in structure. Pulmonic valve regurgitation is mild by color flow Doppler. Venous: The inferior vena cava is normal in size with less than 50% respiratory variability.     09/14/12 TTE Study Conclusions - Left ventricle: The cavity size was moderately dilated. Wall thickness was normal. Indeterminant diastolic function. The estimated ejection fraction was 20%. Diffuse hypokinesis. - Aortic valve: There was no stenosis. - Mitral valve: Mechanical mitral  valve. No evidence for significant prosthetic stenosis. Trivial regurgitation. Mean gradient: 96mm Hg (D). - Left atrium: The atrium was moderately dilated. - Right ventricle: The cavity size was normal. Pacer wire or catheter noted in right ventricle. Systolic function was mildly to moderately reduced. - Right atrium: The atrium was mildly dilated. - Atrial septum: The septum bowed from left to right, consistent with increased left atrial pressure. - Tricuspid valve: Mild-moderate regurgitation. Peak RV-RA gradient:50mm Hg (S). - Pulmonary arteries: PA peak pressure: 61mm Hg (S). - Inferior vena cava: The vessel was normal in size; the respirophasic diameter changes were in the normal range (= 50%); findings are consistent with normal central venous pressure. Impressions - Moderately dilated LV with severe global hypokinesis, EF 20%. Mechanical mitral valve appears to function normally. Normal RV size with mild to moderately decreased systolic function. Mild pulmonary hypertension  Laboratory Data:  Chemistry Recent Labs  Lab 11/26/18 1614  NA 134*  K 4.1  CL 99  CO2 20*  GLUCOSE 181*  BUN 17  CREATININE 0.97  CALCIUM 8.8*  GFRNONAA 58*  GFRAA >60  ANIONGAP 15    Recent Labs  Lab 11/26/18 1614  PROT 7.0  ALBUMIN 2.7*  AST 41  ALT 44  ALKPHOS 76  BILITOT 1.9*   Hematology Recent Labs  Lab 11/26/18 1614  WBC 20.2*  RBC 5.38*  HGB 15.3*  HCT 47.5*  MCV 88.3  MCH 28.4  MCHC 32.2  RDW 15.0  PLT 299  Cardiac Enzymes Recent Labs  Lab 11/26/18 1614 11/26/18 2349  TROPONINI 0.16* 0.40*   No results for input(s): TROPIPOC in the last 168 hours.  BNPNo results for input(s): BNP, PROBNP in the last 168 hours.  DDimer No results for input(s): DDIMER in the last 168 hours.  Radiology/Studies:   Ct Abdomen Pelvis W Contrast Result Date: 11/26/2018 CLINICAL DATA:  Anorectal abscess EXAM: CT ABDOMEN AND PELVIS WITH CONTRAST  TECHNIQUE: Multidetector CT imaging of the abdomen and pelvis was performed using the standard protocol following bolus administration of intravenous contrast. CONTRAST:  154mL OMNIPAQUE IOHEXOL 300 MG/ML  SOLN COMPARISON:  Virtual colonoscopy dated 05/04/2014 FINDINGS: Motion degraded images. Lower chest: Cardiomegaly. Pacemaker leads, incompletely visualized. Hepatobiliary: Liver is within normal limits. Gallbladder is unremarkable. No intrahepatic or extrahepatic ductal dilatation. Pancreas: Within normal limits. Spleen: Within normal limits. Adrenals/Urinary Tract: Adrenal glands are within normal limits. Moderate right hydronephrosis with UPJ configuration, chronic. Overlying cortical thinning/scarring, chronic. Small left renal cysts, poorly evaluated due to motion degradation. Extrarenal pelvis. Bladder is within normal limits. Stomach/Bowel: Stomach is within normal limits. No evidence of bowel obstruction. Status post partial right hemicolectomy with suture line in the right mid abdomen. Appendix is not discretely visualized. Extensive left colonic diverticulosis, without evidence of diverticulitis. No discrete perianal/perirectal fluid collection/abscess. However, there are multiple foci of gas in the right perineal and right medial gluteal region (series 3/images 85 and 90). In the absence of recent surgical intervention, this is worrisome for perineal infection, Fournier's gangrene not excluded. Vascular/Lymphatic: No evidence of abdominal aortic aneurysm. Atherosclerotic calcifications of the abdominal aorta and branch vessels. No suspicious abdominopelvic lymphadenopathy. Reproductive: Status post hysterectomy. No adnexal masses. Other: No abdominopelvic ascites. Musculoskeletal: Mild degenerative changes of the visualized thoracolumbar spine. IMPRESSION: No discrete perianal/perirectal fluid collection/abscess. Multiple foci of gas in the right perineal and right medial gluteal region. In the absence  of recent surgical intervention, this is worrisome for perineal infection, Fournier's gangrene not excluded. Surgical consultation is advised. These results were called by telephone at the time of interpretation on 11/26/2018 at 10:30 pm to Dr. Isla Pence , who verbally acknowledged these results. Electronically Signed   By: Julian Hy M.D.   On: 11/26/2018 22:33    Dg Chest Port 1 View Result Date: 11/26/2018 CLINICAL DATA:  Syncopal episode with loss of consciousness while in the waiting room of the surgical center earlier today. Indwelling biventricular pacing defibrillator. EXAM: PORTABLE CHEST 1 VIEW COMPARISON:  10/26/2018 and earlier. FINDINGS: Cardiac silhouette markedly enlarged, unchanged. Prior mitral valve replacement. LEFT subclavian biventricular pacing defibrillator unchanged. RIGHT subclavian dual lead transvenous pacemaker unchanged. Lungs clear. Bronchovascular markings normal, with calcified tracheobronchial cartilages. Pulmonary vascularity normal. No visible pleural effusions. No pneumothorax. Stable BILATERAL lower lobe bronchiectasis. IMPRESSION: Stable marked cardiomegaly. No acute cardiopulmonary disease. Electronically Signed   By: Evangeline Dakin M.D.   On: 11/26/2018 16:12    Assessment and Plan:   1. Syncope/VF     ICD MOD interrogation reviewed     Appropriate shocks for VF, all 4 shocks usccessful  Home rate/rhythm meds: she is on no AAD, takes coreg 3.125mg  BID HERE: on amio gtt (including bolus) >> 60/hr >> now on 30mg /hr  Electrolytes are OK, mag was 1.8, received dose She has h/o VF arrest  This event perhaps provoked by infection Agree with amiodarone   mild Trop elevation likely 2/2 VF/shocks  surgical clearance - the patient may proceed with surgery as she will certainly die without surgery. Her  risk is moderate to high. While her prognosis is worrisome, I would recommend proceeding with surgery as soon as her INR is acceptable.  2. NICM     LVEF 15-20%    CorView looks OK, CXR does not suggest fluid OL    Recommend care with IVF    She is not decompensated at present  3. Rheumatic VHD w/Mechanical MVR (Bjork-Shiley 1983)     funtioning well on echo from march     Permanent AFib     Coumadin toxic w/INR 9.1 on admission     She has also had stroke, DVT as well as embolic event to LE, will need bridging for surgery     INR today is pending s/p Vit K, FFP infusing     It will be ok to allow her INR to drift below 2 transiently.  4. Perirneal/perirectal infection     CT noted Multiple foci of gas in the right perineal and right medial gluteal region. In the absence of recent surgical intervention, this is worrisome for perineal infection, Fournier's gangrene not excluded.     Deferred to medicine and surgical teams     BC drawn yesterday, pending     For questions or updates, please contact [place] Please consult [email or web address] for contact info under     Signed, Mikle Bosworth.D.

## 2018-11-27 NOTE — Progress Notes (Signed)
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, Hx of Vfib arrest, LBBB  S/p [employer] Jude biventricular ICD placement, atrial fibrillation, and memory deficit  History is limited from patient as she does not recall the episode and only answers with simple yes/no  She was at the surgeon's office for evaluation of a perirectal infection, CT noted showed multiple foci of gas She had a syncopal episode with icd shock at the surgical center, she is sent to the ER  Cardiology consulted, she is started on amiodarone drip She is found to have elevated INR, she received vit k and FFP on admission per general surgery recommendation   HPI/Recap of past 24 hours:  She is lethargic, denies chest pain, does reports abdominal pain, no fever No active n/v, no bm documented On amiodarone drip, vanc/zosyn Urine output not documented Awaiting am lab  Assessment/Plan: Principal Problem:   Syncope Active Problems:   Systolic CHF, chronic (HCC)   Ventricular fibrillation ([place])   S/P Mechanical MVR (mitral valve replacement)   ICD (implantable cardioverter-defibrillator), biventricular, in situ   Elevated troponin   VT (ventricular tachycardia) ([place])   Supratherapeutic INR   Perineal abscess  syncope/Ventricular tachyarrhythmia with ventricular fibrillation s/p ICD shock x4: Rate and rhythm improving with IV amiodarone although has frequent NSVT.   Cardiology /EP consulted, will follow recommendation  Right perineal/perirectal abscess with presence of gas/possible Fournier's gangrene - blood cultures in process, mrsa screening negative -Start IV vancomycin and Zosyn per pharmacy consultation -General surgery consulted, will eventually need drainage under general anesthesia with cardiology clearance and INR reversal  Afib, SSS s/p PPM,  s/p mechanical MVR on coumadin, with supratherapeutic INR on presentation  Nonischemic cardiomyopathy, EF 15% s/o biVICD -Recent hospitalization for heart failure from 3/2 to 3/4 -currently euvolemic to dry -close monitor volume status, cardiology consulted   Dementia/FTT -she had h/o colon cancer, she has h/o gi bleed, palliative care involved in 04/2017, family was not receptive to palliative care at the time per chart review  Code Status: full  Family Communication: patient   Disposition Plan: not ready to discharge   Consultants:  Cardiology/EP  General surgery  Procedures:  none  Antibiotics:  Vanc/zosyn from admission   Objective: BP 100/64    Pulse 76    Temp 97.7 F (36.5 C) (Oral)    Resp 20    Ht 5\' 6"  (1.676 m)    Wt 60.7 kg    SpO2 100%    BMI 21.60 kg/m   Intake/Output Summary (Last 24 hours) at 11/27/2018 0843 Last data filed at 11/27/2018 0732 Gross per 24 hour  Intake 4111.35 ml  Output 0 ml  Net 4111.35 ml   Filed Weights   11/26/18 2335 11/27/18 0500  Weight: 60.7 kg 60.7 kg    Exam: Patient is examined daily including today on 11/27/2018, exams remain the same as of yesterday except that has changed    General:  Chronically ill, lethargic, weak, answers simple questions  Cardiovascular: paced rhythm  Respiratory: diminished, no wheezing, no rales, no rhonchi  Abdomen: generalized tender, but soft, positive BS  Musculoskeletal: No Edema  Neuro: lethargic, answers simple questions   Data Reviewed: Basic Metabolic Panel: Recent Labs  Lab 11/26/18 1614  NA 134*  K 4.1  CL 99  CO2 20*  GLUCOSE 181*  BUN 17  CREATININE  0.97  CALCIUM 8.8*  MG 1.8   Liver Function Tests: Recent Labs  Lab 11/26/18 1614  AST 41  ALT 44  ALKPHOS 76  BILITOT 1.9*  PROT 7.0  ALBUMIN 2.7*   No results for input(s): LIPASE, AMYLASE in the last 168 hours. No results for input(s): AMMONIA in the last 168 hours. CBC: Recent Labs  Lab  11/26/18 1614  WBC 20.2*  NEUTROABS 18.5*  HGB 15.3*  HCT 47.5*  MCV 88.3  PLT 299   Cardiac Enzymes:   Recent Labs  Lab 11/26/18 1614 11/26/18 2349  TROPONINI 0.16* 0.40*   BNP (last 3 results) Recent Labs    10/26/18 1855  BNP 1,092.7*    ProBNP (last 3 results) No results for input(s): PROBNP in the last 8760 hours.  CBG: Recent Labs  Lab 11/26/18 1617 11/26/18 2347  GLUCAP 172* 134*    Recent Results (from the past 240 hour(s))  MRSA PCR Screening     Status: None   Collection Time: 11/27/18 12:41 AM  Result Value Ref Range Status   MRSA by PCR NEGATIVE NEGATIVE Final    Comment:        The GeneXpert MRSA Assay (FDA approved for NASAL specimens only), is one component of a comprehensive MRSA colonization surveillance program. It is not intended to diagnose MRSA infection nor to guide or monitor treatment for MRSA infections. Performed at [facility], [place] [address] [place], [place] [address]      Studies: Ct Abdomen Pelvis W Contrast  Result Date: 11/26/2018 CLINICAL DATA:  Anorectal abscess EXAM: CT ABDOMEN AND PELVIS WITH CONTRAST TECHNIQUE: Multidetector CT imaging of the abdomen and pelvis was performed using the standard protocol following bolus administration of intravenous contrast. CONTRAST:  153mL OMNIPAQUE IOHEXOL 300 MG/ML  SOLN COMPARISON:  Virtual colonoscopy dated 05/04/2014 FINDINGS: Motion degraded images. Lower chest: Cardiomegaly. Pacemaker leads, incompletely visualized. Hepatobiliary: Liver is within normal limits. Gallbladder is unremarkable. No intrahepatic or extrahepatic ductal dilatation. Pancreas: Within normal limits. Spleen: Within normal limits. Adrenals/Urinary Tract: Adrenal glands are within normal limits. Moderate right hydronephrosis with UPJ configuration, chronic. Overlying cortical thinning/scarring, chronic. Small left renal cysts, poorly evaluated due to motion degradation. Extrarenal pelvis. Bladder is within  normal limits. Stomach/Bowel: Stomach is within normal limits. No evidence of bowel obstruction. Status post partial right hemicolectomy with suture line in the right mid abdomen. Appendix is not discretely visualized. Extensive left colonic diverticulosis, without evidence of diverticulitis. No discrete perianal/perirectal fluid collection/abscess. However, there are multiple foci of gas in the right perineal and right medial gluteal region (series 3/images 85 and 90). In the absence of recent surgical intervention, this is worrisome for perineal infection, Fournier's gangrene not excluded. Vascular/Lymphatic: No evidence of abdominal aortic aneurysm. Atherosclerotic calcifications of the abdominal aorta and branch vessels. No suspicious abdominopelvic lymphadenopathy. Reproductive: Status post hysterectomy. No adnexal masses. Other: No abdominopelvic ascites. Musculoskeletal: Mild degenerative changes of the visualized thoracolumbar spine. IMPRESSION: No discrete perianal/perirectal fluid collection/abscess. Multiple foci of gas in the right perineal and right medial gluteal region. In the absence of recent surgical intervention, this is worrisome for perineal infection, Fournier's gangrene not excluded. Surgical consultation is advised. These results were called by telephone at the time of interpretation on 11/26/2018 at 10:30 pm to Dr. Isla Pence , who verbally acknowledged these results. Electronically Signed   By: Julian Hy M.D.   On: 11/26/2018 22:33   Dg Chest Port 1 View  Result Date: 11/26/2018 CLINICAL DATA:  Syncopal episode with loss of consciousness while in the waiting room of the surgical center earlier today. Indwelling biventricular pacing defibrillator. EXAM: PORTABLE CHEST 1 VIEW COMPARISON:  10/26/2018 and earlier. FINDINGS: Cardiac silhouette markedly enlarged, unchanged. Prior mitral valve replacement. LEFT subclavian biventricular pacing defibrillator unchanged. RIGHT subclavian  dual lead transvenous pacemaker unchanged. Lungs clear. Bronchovascular markings normal, with calcified tracheobronchial cartilages. Pulmonary vascularity normal. No visible pleural effusions. No pneumothorax. Stable BILATERAL lower lobe bronchiectasis. IMPRESSION: Stable marked cardiomegaly. No acute cardiopulmonary disease. Electronically Signed   By: Evangeline Dakin M.D.   On: 11/26/2018 16:12    Scheduled Meds:  mouth rinse  15 mL Mouth Rinse BID   sodium chloride flush  3 mL Intravenous Q12H    Continuous Infusions:  amiodarone 30 mg/hr (11/27/18 0652)   piperacillin-tazobactam (ZOSYN)  IV 3.375 g (11/27/18 0521)   vancomycin       Time spent: 25mins I have personally reviewed and interpreted on  11/27/2018 daily labs, tele strips, imagings as discussed above under date review session and assessment and plans.  I reviewed all nursing notes, pharmacy notes, consultant notes,  vitals, pertinent old records  I have discussed plan of care as described above with RN , patient on 11/27/2018   Florencia Reasons MD, PhD  [facility] Pager [phone]. If 7PM-7AM, please contact night-coverage at [email or web address], password [facility] 11/27/2018, 8:43 AM  LOS: 1 day

## 2018-11-27 NOTE — Progress Notes (Signed)
Called regarding patient having torsades - she had appropriate detection and firing by her AICD. Now in a BI-V paced rhythm. Given multiple shocks and tentative plans for surgery tomorrow, will further increase amiodarone to 60 mg/hr for arrhythmia suppression. Signout provided to overnight fellow Dr. Marletta Lor.  Pixie Casino, MD, [facility], [place] Director of the [facility] Lipid [facility] &  Cardiovascular Risk Reduction Clinic Diplomate of the American Board of Clinical Lipidology Attending Cardiologist  Direct Dial: [phone]  Fax: [phone]  Website:  [email or web address]

## 2018-11-27 NOTE — Progress Notes (Signed)
Spoke to patient's husband and son about this situation including the procedure along with risks and complications.  The family would like to visit the patient given how high risk she is prior to surgery.  I have contacted the on call covering director to figure out how to make this happen which she stated she would work on and contact the family back in the next few minutes.  The family does want to proceed with surgery tomorrow to try and get the patient better understanding all of her risks.  We will plan for tomorrow unless something changes.  Henreitta Cea 4:06 PM 11/27/2018

## 2018-11-27 NOTE — Progress Notes (Addendum)
Notified by RN @ 20:45 that patient continuing to have runs of NSVT despite higher dose of amiodarone gtt. Most recent was 16 beat run.   Ordered BMP, Mg to be drawn now to ensure lytes adequately repleted. Also ordered one time amiodarone bolus over 30 minutes, as patient not yet reached 10g load.   Should VT persist, will plan to transfer to [facility] ICU for lidocaine bolus and infusion.   Lauren K. Marletta Lor, MD _______________________ Addendum:  At [place] AM informed by RN that patient had another episode of VT resulting in device firing.  K 3.2 - repleted with 40 mEq Mg 2.1  I ordered another 40 mEq of potassium.   Patient to be transferred to CCU for lidocaine infusion.   Communicated these plans to the overnight hospitalist caring for the patient.

## 2018-11-27 NOTE — Progress Notes (Signed)
At 1932 pt went into an arrhythmia that resulted in her ICD firing. TRH and Cardiology made aware. Cardiology put in order for Amiodarone to be increased. BP's have been stable post shock.  At 2018 pt had another 16 beats of v.tach Cardiology made aware. Will continue to monitor.  Tressie Ellis, RN

## 2018-11-27 NOTE — Progress Notes (Signed)
Patient ID: Angela [name], female   DOB: [birth year], 73 y.o.   MRN: [id number]       Subjective: Patient is demented.  She doesn't know why she is here.  She states "I think i'm in the hospital, but I don't know which one or where."  She is not oriented to time or DOB right now either.  She is unaware that she has an abscess on her backside.  Objective: Vital signs in last 24 hours: Temp:  [97.7 F (36.5 C)-98.6 F (37 C)] 97.7 F (36.5 C) (04/03 0650) Pulse Rate:  [41-95] 76 (04/03 0629) Resp:  [12-37] 20 (04/03 0650) BP: (98-146)/(46-97) 100/64 (04/03 0650) SpO2:  [96 %-100 %] 100 % (04/03 0650) Weight:  [60.7 kg] 60.7 kg (04/03 0500) Last BM Date: 11/26/18  Intake/Output from previous day: 04/02 0701 - 04/03 0700 In: 4111.4 [I.V.:1803.4; Blood:1308; IV Piggyback:1000] Out: -  Intake/Output this shift: No intake/output data recorded.  PE: Heart: regular Lungs: CTAB Skin: right buttock induration and abscess extending anterior as best as I can see into her perineum.  Lab Results:  Recent Labs    11/26/18 1614  WBC 20.2*  HGB 15.3*  HCT 47.5*  PLT 299   BMET Recent Labs    11/26/18 1614  NA 134*  K 4.1  CL 99  CO2 20*  GLUCOSE 181*  BUN 17  CREATININE 0.97  CALCIUM 8.8*   PT/INR Recent Labs    11/26/18 2053  LABPROT 72.7*  INR 9.1*   CMP     Component Value Date/Time   NA 134 (L) 11/26/2018 1614   K 4.1 11/26/2018 1614   CL 99 11/26/2018 1614   CO2 20 (L) 11/26/2018 1614   GLUCOSE 181 (H) 11/26/2018 1614   BUN 17 11/26/2018 1614   CREATININE 0.97 11/26/2018 1614   CALCIUM 8.8 (L) 11/26/2018 1614   PROT 7.0 11/26/2018 1614   ALBUMIN 2.7 (L) 11/26/2018 1614   AST 41 11/26/2018 1614   ALT 44 11/26/2018 1614   ALKPHOS 76 11/26/2018 1614   BILITOT 1.9 (H) 11/26/2018 1614   GFRNONAA 58 (L) 11/26/2018 1614   GFRAA >60 11/26/2018 1614   Lipase  No results found for: LIPASE     Studies/Results: Ct Abdomen Pelvis W Contrast  Result Date:  11/26/2018 CLINICAL DATA:  Anorectal abscess EXAM: CT ABDOMEN AND PELVIS WITH CONTRAST TECHNIQUE: Multidetector CT imaging of the abdomen and pelvis was performed using the standard protocol following bolus administration of intravenous contrast. CONTRAST:  139mL OMNIPAQUE IOHEXOL 300 MG/ML  SOLN COMPARISON:  Virtual colonoscopy dated 05/04/2014 FINDINGS: Motion degraded images. Lower chest: Cardiomegaly. Pacemaker leads, incompletely visualized. Hepatobiliary: Liver is within normal limits. Gallbladder is unremarkable. No intrahepatic or extrahepatic ductal dilatation. Pancreas: Within normal limits. Spleen: Within normal limits. Adrenals/Urinary Tract: Adrenal glands are within normal limits. Moderate right hydronephrosis with UPJ configuration, chronic. Overlying cortical thinning/scarring, chronic. Small left renal cysts, poorly evaluated due to motion degradation. Extrarenal pelvis. Bladder is within normal limits. Stomach/Bowel: Stomach is within normal limits. No evidence of bowel obstruction. Status post partial right hemicolectomy with suture line in the right mid abdomen. Appendix is not discretely visualized. Extensive left colonic diverticulosis, without evidence of diverticulitis. No discrete perianal/perirectal fluid collection/abscess. However, there are multiple foci of gas in the right perineal and right medial gluteal region (series 3/images 85 and 90). In the absence of recent surgical intervention, this is worrisome for perineal infection, Fournier's gangrene not excluded. Vascular/Lymphatic: No evidence of abdominal aortic aneurysm.  Atherosclerotic calcifications of the abdominal aorta and branch vessels. No suspicious abdominopelvic lymphadenopathy. Reproductive: Status post hysterectomy. No adnexal masses. Other: No abdominopelvic ascites. Musculoskeletal: Mild degenerative changes of the visualized thoracolumbar spine. IMPRESSION: No discrete perianal/perirectal fluid collection/abscess.  Multiple foci of gas in the right perineal and right medial gluteal region. In the absence of recent surgical intervention, this is worrisome for perineal infection, Fournier's gangrene not excluded. Surgical consultation is advised. These results were called by telephone at the time of interpretation on 11/26/2018 at 10:30 pm to Dr. Isla Pence , who verbally acknowledged these results. Electronically Signed   By: Julian Hy M.D.   On: 11/26/2018 22:33   Dg Chest Port 1 View  Result Date: 11/26/2018 CLINICAL DATA:  Syncopal episode with loss of consciousness while in the waiting room of the surgical center earlier today. Indwelling biventricular pacing defibrillator. EXAM: PORTABLE CHEST 1 VIEW COMPARISON:  10/26/2018 and earlier. FINDINGS: Cardiac silhouette markedly enlarged, unchanged. Prior mitral valve replacement. LEFT subclavian biventricular pacing defibrillator unchanged. RIGHT subclavian dual lead transvenous pacemaker unchanged. Lungs clear. Bronchovascular markings normal, with calcified tracheobronchial cartilages. Pulmonary vascularity normal. No visible pleural effusions. No pneumothorax. Stable BILATERAL lower lobe bronchiectasis. IMPRESSION: Stable marked cardiomegaly. No acute cardiopulmonary disease. Electronically Signed   By: Evangeline Dakin M.D.   On: 11/26/2018 16:12    Anti-infectives: Anti-infectives (From admission, onward)   Start     Dose/Rate Route Frequency Ordered Stop   11/27/18 2300  vancomycin (VANCOCIN) 1,000 mg in sodium chloride 0.9 % 250 mL IVPB     1,000 mg 250 mL/hr over 60 Minutes Intravenous Every 24 hours 11/26/18 2336     11/27/18 0600  piperacillin-tazobactam (ZOSYN) IVPB 3.375 g     3.375 g 12.5 mL/hr over 240 Minutes Intravenous Every 8 hours 11/26/18 2336     11/26/18 2330  vancomycin (VANCOCIN) 1,250 mg in sodium chloride 0.9 % 250 mL IVPB     1,250 mg 166.7 mL/hr over 90 Minutes Intravenous STAT 11/26/18 2324 11/27/18 0345   11/26/18 2230   piperacillin-tazobactam (ZOSYN) IVPB 3.375 g     3.375 g 100 mL/hr over 30 Minutes Intravenous  Once 11/26/18 2218 11/26/18 2302       Assessment/Plan V-tach with V fib, s/o ICD shock x4  Nonischemic cardiomyopathy with EF of 15% supratherapeutic INR - 4th FFP getting ready to start.  Await labs for this morning Dementia/FTT - appears from chart family not receptive to palliative care at this time, but may need their involvement pending how she does with all of this.  Right buttock/perineal abscess -needs to be drained.  Will need to get consent from her husband as patient has dementia and clearly doesn't know why she is here or what is going on. -will await cards eval given shock events yesterday and EF of 15%.  The patient is likely to be high risk for general anesthesia. -if cleared and INR gets to around 1.5, will plan for OR -remain NPO  FEN - NPO VTE - reversing INR of 9 ID - Vanc/zosyn   LOS: 1 day    Henreitta Cea , [facility] 11/27/2018, 10:02 AM Pager: [phone]

## 2018-11-27 NOTE — Consult Note (Signed)
[facility] Nurse wound consult note Reason for Consult:Large perineal abscess. Patient has been evaluated by Dr. Georgette Dover and is anticipating surgery when lab work stable. I have provided a conservative care order (NS cleanse with dry ABD pad secured with paper tape and changed twice daily and PRN. Wound type:infectious  [place] nursing team will not follow, but will remain available to this patient, the nursing and medical teams.  Please re-consult if needed. Thanks, Maudie Flakes, MSN, RN, [place], Ar[facility] Abbott  Pager# [phone]

## 2018-11-28 ENCOUNTER — Inpatient Hospital Stay (HOSPITAL_COMMUNITY): Payer: Medicare HMO | Admitting: Certified Registered Nurse Anesthetist

## 2018-11-28 ENCOUNTER — Encounter (HOSPITAL_COMMUNITY)
Admission: EM | Disposition: A | Discharge status: Home Health | Payer: Self-pay | Source: Ambulatory Visit | Attending: Internal Medicine

## 2018-11-28 DIAGNOSIS — L02215 Cutaneous abscess of perineum: Secondary | ICD-10-CM

## 2018-11-28 HISTORY — PX: INCISION AND DRAINAGE PERIRECTAL ABSCESS: SHX1804

## 2018-11-28 LAB — BPAM FFP
Blood Product Expiration Date: 202004022359
Blood Product Expiration Date: 202004042359
Blood Product Expiration Date: 202004042359
Blood Product Expiration Date: 202004072359
Blood Product Expiration Date: 202004072359
ISSUE DATE / TIME: 202004021912
ISSUE DATE / TIME: 202004030202
ISSUE DATE / TIME: 202004030357
ISSUE DATE / TIME: 202004030619
ISSUE DATE / TIME: 202004031020
Unit Type and Rh: 600
Unit Type and Rh: 6200
Unit Type and Rh: 6200
Unit Type and Rh: 6200
Unit Type and Rh: 6200

## 2018-11-28 LAB — PREPARE FRESH FROZEN PLASMA
Unit division: 0
Unit division: 0
Unit division: 0
Unit division: 0

## 2018-11-28 LAB — BASIC METABOLIC PANEL
Anion gap: 9 (ref 5–15)
BUN: 9 mg/dL (ref 8–23)
CO2: 22 mmol/L (ref 22–32)
Calcium: 8.1 mg/dL — ABNORMAL LOW (ref 8.9–10.3)
Chloride: 104 mmol/L (ref 98–111)
Creatinine, Ser: 0.72 mg/dL (ref 0.44–1.00)
GFR calc Af Amer: >60 mL/min (ref >60)
GFR calc non Af Amer: >60 mL/min (ref >60)
Glucose, Bld: 102 mg/dL — ABNORMAL HIGH (ref 70–99)
Potassium: 3.5 mmol/L (ref 3.5–5.1)
Sodium: 135 mmol/L (ref 135–145)

## 2018-11-28 LAB — CBC
HCT: 36.1 % (ref 36.0–46.0)
Hemoglobin: 11.7 g/dL — ABNORMAL LOW (ref 12.0–15.0)
MCH: 28.6 pg (ref 26.0–34.0)
MCHC: 32.4 g/dL (ref 30.0–36.0)
MCV: 88.3 fL (ref 80.0–100.0)
Platelets: 225 10*3/uL (ref 150–400)
RBC: 4.09 MIL/uL (ref 3.87–5.11)
RDW: 14.9 % (ref 11.5–15.5)
WBC: 16.8 10*3/uL — ABNORMAL HIGH (ref 4.0–10.5)
nRBC: 0 % (ref 0.0–0.2)

## 2018-11-28 LAB — GLUCOSE, CAPILLARY
Glucose-Capillary: 100 mg/dL — ABNORMAL HIGH (ref 70–99)
Glucose-Capillary: 103 mg/dL — ABNORMAL HIGH (ref 70–99)
Glucose-Capillary: 106 mg/dL — ABNORMAL HIGH (ref 70–99)
Glucose-Capillary: 106 mg/dL — ABNORMAL HIGH (ref 70–99)

## 2018-11-28 LAB — HEMOGLOBIN A1C
Hgb A1c MFr Bld: 6 % — ABNORMAL HIGH (ref 4.8–5.6)
Mean Plasma Glucose: 125.5 mg/dL

## 2018-11-28 LAB — PROTIME-INR
INR: 1.4 — ABNORMAL HIGH (ref 0.8–1.2)
Prothrombin Time: 17 seconds — ABNORMAL HIGH (ref 11.4–15.2)

## 2018-11-28 SURGERY — INCISION AND DRAINAGE, ABSCESS, PERIRECTAL
Anesthesia: General | Site: Buttocks | Laterality: Right

## 2018-11-28 MED ORDER — ROCURONIUM BROMIDE 50 MG/5ML IV SOSY
PREFILLED_SYRINGE | INTRAVENOUS | Status: DC | PRN
  Administered 2018-11-28: 50 mg via INTRAVENOUS

## 2018-11-28 MED ORDER — LIDOCAINE IN D5W 4-5 MG/ML-% IV SOLN
0.5000 mg/min | INTRAVENOUS | Status: DC
  Administered 2018-11-28: 0.5 mg/min via INTRAVENOUS
  Filled 2018-11-28: qty 500

## 2018-11-28 MED ORDER — EPHEDRINE SULFATE-NACL 50-0.9 MG/10ML-% IV SOSY
PREFILLED_SYRINGE | INTRAVENOUS | Status: DC | PRN
  Administered 2018-11-28 (×3): 10 mg via INTRAVENOUS

## 2018-11-28 MED ORDER — SUCCINYLCHOLINE CHLORIDE 200 MG/10ML IV SOSY
PREFILLED_SYRINGE | INTRAVENOUS | Status: AC
  Filled 2018-11-28: qty 10

## 2018-11-28 MED ORDER — POTASSIUM CHLORIDE CRYS ER 10 MEQ PO TBCR
40.0000 meq | EXTENDED_RELEASE_TABLET | Freq: Every day | ORAL | Status: DC
  Administered 2018-11-28 – 2018-12-01 (×4): 40 meq via ORAL
  Filled 2018-11-28 (×5): qty 4

## 2018-11-28 MED ORDER — FENTANYL CITRATE (PF) 250 MCG/5ML IJ SOLN
INTRAMUSCULAR | Status: AC
  Filled 2018-11-28: qty 5

## 2018-11-28 MED ORDER — ONDANSETRON HCL 4 MG/2ML IJ SOLN
INTRAMUSCULAR | Status: AC
  Filled 2018-11-28: qty 2

## 2018-11-28 MED ORDER — PROPOFOL 10 MG/ML IV BOLUS
INTRAVENOUS | Status: AC
  Filled 2018-11-28: qty 20

## 2018-11-28 MED ORDER — PROPOFOL 10 MG/ML IV BOLUS
INTRAVENOUS | Status: DC | PRN
  Administered 2018-11-28: 40 mg via INTRAVENOUS

## 2018-11-28 MED ORDER — ONDANSETRON HCL 4 MG/2ML IJ SOLN
INTRAMUSCULAR | Status: DC | PRN
  Administered 2018-11-28: 4 mg via INTRAVENOUS

## 2018-11-28 MED ORDER — LACTATED RINGERS IV SOLN
INTRAVENOUS | Status: DC | PRN
  Administered 2018-11-28: 09:00:00 via INTRAVENOUS

## 2018-11-28 MED ORDER — LIDOCAINE BOLUS VIA INFUSION
50.0000 mg | Freq: Once | INTRAVENOUS | Status: AC
  Administered 2018-11-28: 50 mg via INTRAVENOUS
  Filled 2018-11-28: qty 52

## 2018-11-28 MED ORDER — FENTANYL CITRATE (PF) 100 MCG/2ML IJ SOLN
INTRAMUSCULAR | Status: DC | PRN
  Administered 2018-11-28: 25 ug via INTRAVENOUS
  Administered 2018-11-28: 50 ug via INTRAVENOUS

## 2018-11-28 MED ORDER — LIDOCAINE 2% (20 MG/ML) 5 ML SYRINGE
INTRAMUSCULAR | Status: AC
  Filled 2018-11-28: qty 5

## 2018-11-28 MED ORDER — 0.9 % SODIUM CHLORIDE (POUR BTL) OPTIME
TOPICAL | Status: DC | PRN
  Administered 2018-11-28: 3000 mL
  Administered 2018-11-28: 11:00:00 1000 mL

## 2018-11-28 MED ORDER — PHENYLEPHRINE 40 MCG/ML (10ML) SYRINGE FOR IV PUSH (FOR BLOOD PRESSURE SUPPORT)
PREFILLED_SYRINGE | INTRAVENOUS | Status: DC | PRN
  Administered 2018-11-28: 120 ug via INTRAVENOUS
  Administered 2018-11-28: 80 ug via INTRAVENOUS
  Administered 2018-11-28: 120 ug via INTRAVENOUS

## 2018-11-28 MED ORDER — SUGAMMADEX SODIUM 200 MG/2ML IV SOLN
INTRAVENOUS | Status: DC | PRN
  Administered 2018-11-28: 130 mg via INTRAVENOUS

## 2018-11-28 MED ORDER — LIDOCAINE 2% (20 MG/ML) 5 ML SYRINGE
INTRAMUSCULAR | Status: DC | PRN
  Administered 2018-11-28: 40 mg via INTRAVENOUS

## 2018-11-28 MED ORDER — SODIUM CHLORIDE 0.9 % IV SOLN
INTRAVENOUS | Status: DC | PRN
  Administered 2018-11-28: 50 ug/min via INTRAVENOUS

## 2018-11-28 MED ORDER — SUCCINYLCHOLINE CHLORIDE 20 MG/ML IJ SOLN
INTRAMUSCULAR | Status: DC | PRN
  Administered 2018-11-28: 100 mg via INTRAVENOUS

## 2018-11-28 MED ORDER — ENOXAPARIN SODIUM 40 MG/0.4ML ~~LOC~~ SOLN
40.0000 mg | SUBCUTANEOUS | Status: DC
  Administered 2018-11-28: 40 mg via SUBCUTANEOUS
  Filled 2018-11-28: qty 0.4

## 2018-11-28 SURGICAL SUPPLY — 31 items
BNDG GAUZE ELAST 4 BULKY (GAUZE/BANDAGES/DRESSINGS) ×2 IMPLANT
BRIEF STRETCH FOR OB PAD LRG (UNDERPADS AND DIAPERS) ×2 IMPLANT
CANISTER SUCT 3000ML PPV (MISCELLANEOUS) ×2 IMPLANT
COVER SURGICAL LIGHT HANDLE (MISCELLANEOUS) ×2 IMPLANT
COVER WAND RF STERILE (DRAPES) ×2 IMPLANT
DRAPE LAPAROTOMY 100X72 PEDS (DRAPES) IMPLANT
DRAPE PROXIMA HALF (DRAPES) IMPLANT
DRSG PAD ABDOMINAL 8X10 ST (GAUZE/BANDAGES/DRESSINGS) ×2 IMPLANT
ELECT REM PT RETURN 9FT ADLT (ELECTROSURGICAL) ×2
ELECTRODE REM PT RTRN 9FT ADLT (ELECTROSURGICAL) ×1 IMPLANT
GAUZE IODOFORM PACK 1/2 7832 (GAUZE/BANDAGES/DRESSINGS) IMPLANT
GAUZE SPONGE 4X4 12PLY STRL (GAUZE/BANDAGES/DRESSINGS) ×2 IMPLANT
GAUZE SPONGE 4X4 12PLY STRL LF (GAUZE/BANDAGES/DRESSINGS) ×2 IMPLANT
GLOVE BIOGEL M STRL SZ7.5 (GLOVE) ×2 IMPLANT
GLOVE BIOGEL PI IND STRL 8 (GLOVE) ×2 IMPLANT
GLOVE BIOGEL PI INDICATOR 8 (GLOVE) ×2
GOWN STRL REUS W/ TWL LRG LVL3 (GOWN DISPOSABLE) ×1 IMPLANT
GOWN STRL REUS W/TWL 2XL LVL3 (GOWN DISPOSABLE) ×2 IMPLANT
GOWN STRL REUS W/TWL LRG LVL3 (GOWN DISPOSABLE) ×1
KIT BASIN OR (CUSTOM PROCEDURE TRAY) ×2 IMPLANT
KIT TURNOVER KIT B (KITS) ×2 IMPLANT
NS IRRIG 1000ML POUR BTL (IV SOLUTION) ×2 IMPLANT
PACK GENERAL/GYN (CUSTOM PROCEDURE TRAY) IMPLANT
PACK LITHOTOMY IV (CUSTOM PROCEDURE TRAY) IMPLANT
PAD ARMBOARD 7.5X6 YLW CONV (MISCELLANEOUS) ×2 IMPLANT
PENCIL SMOKE EVACUATOR (MISCELLANEOUS) ×2 IMPLANT
SWAB COLLECTION DEVICE MRSA (MISCELLANEOUS) IMPLANT
SWAB CULTURE ESWAB REG 1ML (MISCELLANEOUS) IMPLANT
TOWEL OR 17X24 6PK STRL BLUE (TOWEL DISPOSABLE) ×2 IMPLANT
TOWEL OR 17X26 10 PK STRL BLUE (TOWEL DISPOSABLE) ×2 IMPLANT
UNDERPAD 30X30 INCONTINENT (UNDERPADS AND DIAPERS) ×2 IMPLANT

## 2018-11-28 NOTE — Progress Notes (Signed)
Patient ID: Angela [name], female   DOB: [birth year], 73 y.o.   MRN: [id number]   Acute Care Surgery Service Progress Note:    Chief Complaint/Subjective: AICD fired, tx to ICU. [place] gtt  Objective: Vital signs in last 24 hours: Temp:  [97.6 F (36.4 C)-98.5 F (36.9 C)] 97.9 F (36.6 C) (04/04 0800) Pulse Rate:  [71-85] 74 (04/04 0600) Resp:  [15-29] 21 (04/04 0600) BP: (101-145)/(60-97) 118/80 (04/04 0600) SpO2:  [93 %-100 %] 99 % (04/04 0600) Weight:  [63.9 kg] 63.9 kg (04/04 0413) Last BM Date: 11/26/18  Intake/Output from previous day: 04/03 0701 - 04/04 0700 In: 1447.9 [I.V.:833.2; Blood:547; IV Piggyback:67.7] Out: 325 [Urine:325] Intake/Output this shift: No intake/output data recorded.  Perineum/rt buttock - spont draining abscess, nonviable tissue hanging out from wound, induration  Extremities: no edema, +SCDs  Neuro: awake, demented  Lab Results: CBC  Recent Labs    11/27/18 1440 11/28/18 0152  WBC 18.4* 16.8*  HGB 11.5* 11.7*  HCT 35.6* 36.1  PLT 240 225   BMET Recent Labs    11/27/18 2123 11/28/18 0152  NA 134* 135  K 3.2* 3.5  CL 100 104  CO2 22 22  GLUCOSE 115* 102*  BUN 9 9  CREATININE 0.76 0.72  CALCIUM 8.0* 8.1*   LFT Hepatic Function Latest Ref Rng & Units 11/26/2018 04/28/2017 10/24/2010  Total Protein 6.5 - 8.1 g/dL 7.0 6.0(L) 6.6  Albumin 3.5 - 5.0 g/dL 2.7(L) 3.5 3.5  AST 15 - 41 U/L 41 21 22  ALT 0 - 44 U/L 44 15 16  Alk Phosphatase 38 - 126 U/L 76 42 62  Total Bilirubin 0.3 - 1.2 mg/dL 1.9(H) 0.6 0.7   PT/INR Recent Labs    11/27/18 1440 11/28/18 0152  LABPROT 16.8* 17.0*  INR 1.4* 1.4*   ABG No results for input(s): PHART, HCO3 in the last 72 hours.  Invalid input(s): PCO2, PO2  Studies/Results:  Anti-infectives: Anti-infectives (From admission, onward)   Start     Dose/Rate Route Frequency Ordered Stop   11/27/18 2300  vancomycin (VANCOCIN) 1,000 mg in sodium chloride 0.9 % 250 mL IVPB     1,000 mg 250 mL/hr  over 60 Minutes Intravenous Every 24 hours 11/26/18 2336     11/27/18 0600  piperacillin-tazobactam (ZOSYN) IVPB 3.375 g     3.375 g 12.5 mL/hr over 240 Minutes Intravenous Every 8 hours 11/26/18 2336     11/26/18 2330  vancomycin (VANCOCIN) 1,250 mg in sodium chloride 0.9 % 250 mL IVPB     1,250 mg 166.7 mL/hr over 90 Minutes Intravenous STAT 11/26/18 2324 11/27/18 0345   11/26/18 2230  piperacillin-tazobactam (ZOSYN) IVPB 3.375 g     3.375 g 100 mL/hr over 30 Minutes Intravenous  Once 11/26/18 2218 11/26/18 2302      Medications: Scheduled Meds: . insulin aspart  0-9 Units Subcutaneous Q4H  . mouth rinse  15 mL Mouth Rinse BID  . potassium chloride  40 mEq Oral Daily  . sodium chloride flush  3 mL Intravenous Q12H   Continuous Infusions: . amiodarone 60 mg/hr (11/28/18 0600)  . lidocaine 0.5 mg/min (11/28/18 0600)  . piperacillin-tazobactam (ZOSYN)  IV 12.5 mL/hr at 11/28/18 0600  . vancomycin 1,000 mg (11/27/18 2348)   PRN Meds:.acetaminophen **OR** acetaminophen  Assessment/Plan: Patient Active Problem List   Diagnosis Date Noted  . Syncope 11/26/2018  . Ventricular tachycardia ([place]) 11/26/2018  . Supratherapeutic INR 11/26/2018  . Perineal abscess 11/26/2018  . Acute on chronic systolic (  congestive) heart failure ([place]) 10/27/2018  . Elevated troponin 10/26/2018  . Anemia 10/26/2018  . Palliative care by specialist   . Rectal bleeding 04/28/2017  . Acute blood loss anemia 04/28/2017  . ICD (implantable cardioverter-defibrillator), biventricular, in situ 08/24/2014  . Memory deficit 08/23/2014  . Abnormality of gait 08/23/2014  . Dementia ([place]) 12/17/2013  . 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 Hypotension due to drugs 07/12/2012  . Hypokalemia 07/12/2012  . NSVT (nonsustained ventricular tachycardia) ([place]) 07/12/2012  . Acute on chronic systolic heart failure ([place]) 07/07/2012  . Ventricular fibrillation ([place]) 11/11/2011  . Warfarin  anticoagulation 09/22/2011  . Nonischemic cardiomyopathy ([place])   . Atrial fibrillation ([place])   . History of colon cancer   . Internal hemorrhoids   . Rheumatic heart disease   . Sick sinus syndrome ([place])   . Systolic CHF, chronic ([place])   . DIVERTICULAR DISEASE 06/05/2009   V-tach with V fib, s/o ICD shock x4  Nonischemic cardiomyopathy with EF of 15% supratherapeutic INR - resolved Dementia/FTT - appears from chart family not receptive to palliative care at this time, but may need their involvement pending how she does with all of this.  Right buttock/perineal abscess -needs to be drained ideally.    -pt mod-high risk from cards standpoint but underlying infection probably driving some of her cardiac issues overnight.  Discussed via phone with husband this am risk/benefits of surgery including but not limited bleeding, ongoing infection, large wound- need for daily wound care, maybe chronic; death, cardiac events, PNA, organ failure; prolonged hospitalization, discharge to SNF/rehab, stroke, blood clots, vent.  Pt husband verbalized understanding and wishes to proceed. 2 nurse phone consent  FEN - NPO VTE - scds only ID - Vanc/zosyn Disposition: OR later this am  LOS: 2 days     M. Redmond Pulling, MD, FACS General, Bariatric, & Minimally Invasive Surgery [phone] [facility]

## 2018-11-28 NOTE — Anesthesia Procedure Notes (Signed)
Procedure Name: Intubation Date/Time: 11/28/2018 9:33 AM Performed by: Lance Coon, CRNA Pre-anesthesia Checklist: Patient identified, Emergency Drugs available, Suction available, Patient being monitored and Timeout performed Patient Re-evaluated:Patient Re-evaluated prior to induction Oxygen Delivery Method: Circle system utilized Preoxygenation: Pre-oxygenation with 100% oxygen Induction Type: Rapid sequence and IV induction Laryngoscope Size: Miller and 3 Grade View: Grade I Tube type: Oral Tube size: 7.5 mm Number of attempts: 1 Airway Equipment and Method: Stylet Placement Confirmation: ETT inserted through vocal cords under direct vision,  positive ETCO2 and breath sounds checked- equal and bilateral Secured at: 21 cm Tube secured with: Tape Dental Injury: Teeth and Oropharynx as per pre-operative assessment

## 2018-11-28 NOTE — Plan of Care (Signed)
  Problem: Education: Goal: Knowledge of General Education information will improve Description: Including pain rating scale, medication(s)/side effects and non-pharmacologic comfort measures Outcome: Progressing   Problem: Coping: Goal: Level of anxiety will decrease Outcome: Progressing   Problem: Safety: Goal: Ability to remain free from injury will improve Outcome: Progressing   

## 2018-11-28 NOTE — Progress Notes (Signed)
Pt had another arrhythmic event at 0231 that caused her ICD to fire for the second time tonight. [facility] on call made aware. Cardiology on call made aware and placed new orders to transfer pt to  [facility] ICU for lidocaine drip.  Tressie Ellis, RN

## 2018-11-28 NOTE — Progress Notes (Signed)
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, Hx of Vfib arrest, LBBB  S/p [employer] Jude biventricular ICD placement, atrial fibrillation, and memory deficit  History is limited from patient as she does not recall the episode and only answers with simple yes/no  She was at the surgeon's office for evaluation of a perirectal infection, CT noted showed multiple foci of gas She had a syncopal episode with icd shock at the surgical center, she is sent to the ER  Cardiology consulted, she is started on amiodarone drip She is found to have elevated INR, she received vit k and FFP on admission per general surgery recommendation   HPI/Recap of past 24 hours:   She is seen after returned from OR, she states " I am trying to feel better"    Event over night noticed: She had recurrent vfib and icd shock last night while she is on amiodarone drip, currently she is on amiodarone drip and lidocaine drip, she is transferred to cardiac ICU over night     Assessment/Plan: Principal Problem:   Syncope Active Problems:   Systolic CHF, chronic (HCC)   Ventricular fibrillation (HCC)   S/P Mechanical MVR (mitral valve replacement)   ICD (implantable cardioverter-defibrillator), biventricular, in situ   Elevated troponin   Ventricular tachycardia (HCC)   Supratherapeutic INR   Perineal abscess  Right perineal/perirectal abscess with presence of gas/possible Fournier's gangrene - Incision, drainage & Excisional DEBRIDEMENT with scalpel of right Perineal/buttock Fournier's gangrene; pulsatile lavage on 4/4 -may start chemical  prophylaxis but WOULD NOT start any type of gtt today or tonight due to bleeding and risk of bleeding from OR per general surgery,  -blood cultures no growth, mrsa screening negative, wound culture in process, she  is Started IV vancomycin and Zosyn since admission, will follow general surgery recommendation regarding abx choice and duration  syncope/Ventricular tachyarrhythmia with ventricular fibrillation s/p ICD shock x5: She is transferred to cardiac ICU last night due to recurrent vfib while on  IV amiodarone drip, she is currently on amiodarone drip and lidocaine drip.   Cardiology /EP consulted, will discuss with cardiology to transfer patient to cardiology service now that she is in cardiac ICU.   Afib, SSS s/p PPM, s/p mechanical MVR on coumadin, with supratherapeutic INR on presentation INR reversed for surgery, resume anticoagulation when oks with general surgery  Nonischemic cardiomyopathy, EF 15% s/o biVICD -Recent hospitalization for heart failure from 3/2 to 3/4 -currently euvolemic to dry -close monitor volume status, cardiology consulted   Dementia/FTT -she had h/o colon cancer, she has h/o gi bleed, palliative care involved in 04/2017, family was not receptive to palliative care at the time per chart review  Code Status: full  Family Communication: patient   Disposition Plan: not ready to discharge   Consultants:  Cardiology/EP  General surgery  Procedures:  Incision, drainage & Excisional DEBRIDEMENT with scalpel of right Perineal/buttock Fournier's gangrene; pulsatile lavage on 4/4  Antibiotics:  Vanc/zosyn from admission   Objective: BP 99/72   Pulse 73   Temp 98.1 F (36.7 C)   Resp (!) 23   Ht 5\' 6"  (1.676 m)   Wt 63.9 kg   SpO2 99%   BMI 22.74 kg/m   Intake/Output Summary (Last 24 hours) at 11/28/2018 1541 Last data filed at 11/28/2018 1400 Gross per 24 hour  Intake 1575.15 ml  Output 890 ml  Net 685.15 ml   Filed Weights   11/26/18 2335 11/27/18 0500 11/28/18 0413  Weight: 60.7 kg 60.7 kg 63.9 kg    Exam: Patient is examined daily including today on 11/28/2018, exams remain the same as of yesterday except that has changed    General:   Chronically ill, appear more awake, answers simple questions  Cardiovascular: paced rhythm  Respiratory: diminished, no wheezing, no rales, no rhonchi  Abdomen: post op changes, positive BS  Musculoskeletal: No Edema  Neuro: awake, answers simple questions   Data Reviewed: Basic Metabolic Panel: Recent Labs  Lab 11/26/18 1614 11/27/18 1440 11/27/18 2123 11/28/18 0152  NA 134* 138 134* 135  K 4.1 3.0* 3.2* 3.5  CL 99 106 100 104  CO2 20* 23 22 22   GLUCOSE 181* 93 115* 102*  BUN 17 8 9 9   CREATININE 0.97 0.70 0.76 0.72  CALCIUM 8.8* 7.8* 8.0* 8.1*  MG 1.8 1.9 2.1  --    Liver Function Tests: Recent Labs  Lab 11/26/18 1614  AST 41  ALT 44  ALKPHOS 76  BILITOT 1.9*  PROT 7.0  ALBUMIN 2.7*   No results for input(s): LIPASE, AMYLASE in the last 168 hours. No results for input(s): AMMONIA in the last 168 hours. CBC: Recent Labs  Lab 11/26/18 1614 11/27/18 1440 11/28/18 0152  WBC 20.2* 18.4* 16.8*  NEUTROABS 18.5* 16.9*  --   HGB 15.3* 11.5* 11.7*  HCT 47.5* 35.6* 36.1  MCV 88.3 88.1 88.3  PLT 299 240 225   Cardiac Enzymes:   Recent Labs  Lab 11/26/18 1614 11/26/18 2349 11/27/18 1440  TROPONINI 0.16* 0.40* 0.30*   BNP (last 3 results) Recent Labs    10/26/18 1855  BNP 1,092.7*    ProBNP (last 3 results) No results for input(s): PROBNP in the last 8760 hours.  CBG: Recent Labs  Lab 11/27/18 2335 11/28/18 0330 11/28/18 0410 11/28/18 0807 11/28/18 1132  GLUCAP 100* 103* 100* 106* 106*    Recent Results (from the past 240 hour(s))  Culture, blood (Routine X 2) w Reflex to ID Panel     Status: None (Preliminary result)   Collection Time: 11/26/18 11:45 PM  Result Value Ref Range Status   Specimen Description BLOOD RIGHT HAND  Final   Special Requests   Final    BOTTLES DRAWN AEROBIC ONLY Blood Culture adequate volume   Culture   Final    NO GROWTH 1 DAY Performed at [facility], [address], [place], [place] [address]     Report Status PENDING  Incomplete  Culture, blood (Routine X 2) w Reflex to ID Panel     Status: None (Preliminary result)   Collection Time: 11/26/18 11:59 PM  Result Value Ref Range Status   Specimen Description BLOOD LEFT HAND  Final   Special Requests   Final    BOTTLES DRAWN AEROBIC ONLY Blood Culture adequate volume   Culture   Final    NO GROWTH 1 DAY Performed at [facility], [place] [address], [place], [place] [address]    Report Status PENDING  Incomplete  MRSA PCR Screening     Status: None   Collection Time: 11/27/18 12:41 AM  Result Value Ref Range Status   MRSA by PCR NEGATIVE NEGATIVE Final    Comment:        The GeneXpert MRSA Assay (FDA approved for NASAL specimens only), is one component of a comprehensive MRSA colonization surveillance program.  It is not intended to diagnose MRSA infection nor to guide or monitor treatment for MRSA infections. Performed at [facility], [place] [address], [place], [place] [address]   Aerobic/Anaerobic Culture (surgical/deep wound)     Status: None (Preliminary result)   Collection Time: 11/28/18 10:32 AM  Result Value Ref Range Status   Specimen Description ABSCESS BUTTOCKS  Final   Special Requests PATIENT ON FOLLOWING VANC AND ZOSYN  Final   Gram Stain   Final    NO WBC SEEN RARE GRAM POSITIVE COCCI IN PAIRS RARE GRAM POSITIVE RODS Performed at [facility], [address] [place], [place] [address]    Culture PENDING  Incomplete   Report Status PENDING  Incomplete     Studies: No results found.  Scheduled Meds: . insulin aspart  0-9 Units Subcutaneous Q4H  . mouth rinse  15 mL Mouth Rinse BID  . potassium chloride  40 mEq Oral Daily  . sodium chloride flush  3 mL Intravenous Q12H    Continuous Infusions: . amiodarone 60 mg/hr (11/28/18 1156)  . lidocaine 0.5 mg/min (11/28/18 0920)  . piperacillin-tazobactam (ZOSYN)  IV 3.375 g (11/28/18 1425)  . vancomycin 1,000 mg (11/27/18 2348)      Time spent: 39mins I have personally reviewed and interpreted on  11/28/2018 daily labs, tele strips, imagings as discussed above under date review session and assessment and plans.  I reviewed all nursing notes, pharmacy notes, consultant notes,  vitals, pertinent old records  I have discussed plan of care as described above with RN , patient on 11/28/2018   Florencia Reasons MD, PhD  [facility] Pager [phone]. If 7PM-7AM, please contact night-coverage at [email or web address], password [facility] 11/28/2018, 3:41 PM  LOS: 2 days

## 2018-11-28 NOTE — Transfer of Care (Signed)
Immediate Anesthesia Transfer of Care Note  Patient: Marciel Offenberger  Procedure(s) Performed: IRRIGATION AND DEBRIDEMENT PERIRECTAL ABSCESS (Right Buttocks)  Patient Location: PACU  Anesthesia Type:General  Level of Consciousness: drowsy and patient cooperative  Airway & Oxygen Therapy: Patient Spontanous Breathing  Post-op Assessment: Report given to RN and Post -op Vital signs reviewed and stable  Post vital signs: Reviewed and stable  Last Vitals:  Vitals Value Taken Time  BP    Temp    Pulse    Resp    SpO2      Last Pain:  Vitals:   11/28/18 0800  TempSrc: Oral  PainSc: 0-No pain         Complications: No apparent anesthesia complications

## 2018-11-28 NOTE — Anesthesia Preprocedure Evaluation (Addendum)
Anesthesia Evaluation  Patient identified by MRN, date of birth, ID band Patient awake    Reviewed: Allergy & Precautions, NPO status , Patient's Chart, lab work & pertinent test results, reviewed documented beta blocker date and time   History of Anesthesia Complications Negative for: history of anesthetic complications  Airway Mallampati: III  TM Distance: >3 FB Neck ROM: Full    Dental  (+) Dental Advisory Given, Poor Dentition   Pulmonary neg pulmonary ROS,    breath sounds clear to auscultation       Cardiovascular +CHF and + DVT  + dysrhythmias (SSS) Atrial Fibrillation + Cardiac Defibrillator + Valvular Problems/Murmurs (s/p MV replacement)  Rhythm:Regular Rate:Normal   Per notes, AICD fired 4 times in past 24 hours due to arrhythmias. Patient currently on lidocaine infusion  '20 TTE - EF 15-20%. LV cavity was severely dilated. Left ventricular diffuse hypokinesis. RV has moderately reduced systolic function. Left atrial size was severely dilated. Right atrial size was severely dilated. A mechanical valve is present in the mitral position and functioning appropriately. Pulmonic valve regurgitation is mild by color flow doppler.     Neuro/Psych PSYCHIATRIC DISORDERS Dementia CVA    GI/Hepatic negative GI ROS, Neg liver ROS,   Endo/Other  negative endocrine ROS  Renal/GU negative Renal ROS     Musculoskeletal  (+) Arthritis ,   Abdominal   Peds  Hematology  (+) anemia ,   Anesthesia Other Findings   Reproductive/Obstetrics                           Anesthesia Physical Anesthesia Plan  ASA: IV and emergent  Anesthesia Plan: General   Post-op Pain Management:    Induction: Intravenous  PONV Risk Score and Plan: 3 and Treatment may vary due to age or medical condition and Ondansetron  Airway Management Planned: Oral ETT  Additional Equipment: Arterial line  Intra-op Plan:    Post-operative Plan: Possible Post-op intubation/ventilation  Informed Consent: I have reviewed the patients History and Physical, chart, labs and discussed the procedure including the risks, benefits and alternatives for the proposed anesthesia with the patient or authorized representative who has indicated his/her understanding and acceptance.     Dental advisory given and Consent reviewed with POA  Plan Discussed with: CRNA, Anesthesiologist and Surgeon  Anesthesia Plan Comments: ( Discussed grave nature of patient's current illness with her husband via telephone. Expressed high risk of mortality with current situation, especially in setting of surgery and anesthesia. Patient's husband expressed understanding and wishes patient to remain full code at this time.)       Anesthesia Quick Evaluation

## 2018-11-28 NOTE — Progress Notes (Signed)
May start chemical prophylaxis but WOULD NOT start any type of gtt today or tonight due to bleeding and risk of bleeding from OR!  Angela [name] 11:10 AM 11/28/2018

## 2018-11-28 NOTE — Anesthesia Postprocedure Evaluation (Signed)
Anesthesia Post Note  Patient: Angela [name]  Procedure(s) Performed: IRRIGATION AND DEBRIDEMENT PERIRECTAL ABSCESS (Right Buttocks)     Patient location during evaluation: PACU Anesthesia Type: General Level of consciousness: awake and alert Pain management: pain level controlled Vital Signs Assessment: post-procedure vital signs reviewed and stable Respiratory status: spontaneous breathing, nonlabored ventilation, respiratory function stable and patient connected to nasal cannula oxygen Cardiovascular status: blood pressure returned to baseline and stable Postop Assessment: no apparent nausea or vomiting Anesthetic complications: no    Last Vitals:  Vitals:   11/28/18 1135 11/28/18 1200  BP:    Pulse:  70  Resp: 20 18  Temp:    SpO2:  95%    Last Pain:  Vitals:   11/28/18 0800  TempSrc: Oral  PainSc: 0-No pain                 Audry Pili

## 2018-11-28 NOTE — Progress Notes (Signed)
Tele video communication with patient, son and spouse.

## 2018-11-28 NOTE — Op Note (Signed)
11/28/2018  11:01 AM  PATIENT:  Angela [name]  73 y.o. female  PRE-OPERATIVE DIAGNOSIS:  perineal abscess  POST-OPERATIVE DIAGNOSIS: Fournier's gangrene  PROCEDURE:  Procedure(s): Incision, drainage & Excisional DEBRIDEMENT with scalpel of right Perineal/buttock Fournier's gangrene; pulsatile lavage  SURGEON:  Surgeon(s): Greer Pickerel, MD  ASSISTANTS: none   ANESTHESIA:   general  DRAINS: Urinary Catheter (Foley)   LOCAL MEDICATIONS USED:  NONE  SPECIMEN:  Source of Specimen:  aerobic/anaerobic cultures of wound  DISPOSITION OF SPECIMEN:  micro  COUNTS:  YES  INDICATION FOR PROCEDURE: 73 year old female with significant cardiac history and AICD on chronic anticoagulation came in with a spontaneously draining perineal and right buttock abscess.  Her INR was supratherapeutic.  It was gradually corrected.  Cardiology evaluated her and felt her to be moderate to high risk for surgery.  After 2 separate discussions with the husband and he wished that we proceeded with debridement of the infected area.  I had a prolonged conversation with him regarding risk and benefits as well as typical hospitalization as well as the possibility of death and discharge and/or discharged to a nursing facility.  We discussed that this would be a large wound that would need daily dressing changes and potentially additional debridement in the OR  PROCEDURE: After obtaining a phone consent the patient was taken to the OR 2 at [facility] and placed supine on the operating room table.  General endotracheal anesthesia was established.  Sequential compression devices have been placed.  She was placed in lithotomy position with the appropriate padding.  Her perineum and buttocks were prepped and draped in the usual standard surgical fashion with Betadine.  A surgical timeout was performed.  She was on broad-spectrum IV antibiotics.  She had a pre-existing large open spontaneous wound in the proximal right  perineal area measuring 7 cm by about 3-1/2 cm wide.  There is an intact skin bridge more medial and a separate hole or skin defect closer toward the anus measuring about 2 cm x 2 cm.  There is copious drainage of purulent material and nonviable tissue hanging from the wound.  I first sharply removed the skin bridge between the 2 open wounds and debrided it sharply with a scalpel.  I then sharply debrided nonviable tissue which involved soft tissue and fascia of underlying muscle.  The wound tract cephalad upper buttock.  I had to sharply debride additional skin and soft tissue that was nonviable.  I then pulsatile lavage the area with 2-1/2 L of saline.  Hemostasis was achieved with electrocautery.  I then packed the wound with Kerlix.  The dimensions at the end of the procedure were around 20 cm x 7 cm wide by 6 cm in its deepest location.  Cultures were obtained at the beginning of the procedure.  She tolerated that well.  All needle, instrument counts and sponge counts were correct x2.  Patient was extubated  Patient may likely have to return to the operating room in 48 hours for another look    Tool used for debridement (curette, scapel, etc.) scalpel   Frequency of surgical debridement.   Initial    Measurement of total devitalized tissue (wound surface) before and after surgical debridement.   Before: 7 cm x 3-1/2 cm.  After: 20 cm x 7 cm x 6 cm   Area and depth of devitalized tissue removed from wound.  15 x 5 x 4 cm   Blood loss and description of tissue removed.  25  cc, skin, soft tissue and fascia of muscle    Was there any viable tissue removed (measurements): no  PLAN OF CARE: Admit to inpatient   PATIENT DISPOSITION:  PACU - hemodynamically stable.   Delay start of Pharmacological VTE agent (>24hrs) due to surgical blood loss or risk of bleeding:  No; can have chemical VTE prophyalaxis but no systemic hep gtt  Leighton Ruff. Redmond Pulling, MD, FACS General, Bariatric, & Minimally Invasive  Surgery [facility] Surgery, [place]

## 2018-11-28 NOTE — Anesthesia Procedure Notes (Signed)
Arterial Line Insertion Start/End4/11/2018 9:00 AM, 11/28/2018 9:10 AM Performed by: Lance Coon, CRNA, CRNA  Patient location: Pre-op. Preanesthetic checklist: patient identified, IV checked, site marked, risks and benefits discussed, surgical consent, monitors and equipment checked, pre-op evaluation, timeout performed and anesthesia consent Lidocaine 1% used for infiltration Right, radial was placed Catheter size: 20 G Hand hygiene performed , maximum sterile barriers used  and Seldinger technique used  Attempts: 2 Procedure performed without using ultrasound guided technique. Following insertion, dressing applied and Biopatch. Post procedure assessment: normal and unchanged  Patient tolerated the procedure well with no immediate complications.

## 2018-11-28 NOTE — Progress Notes (Signed)
Rings and upper dentures taken to 2H by RN

## 2018-11-28 NOTE — Progress Notes (Signed)
Progress Note  Patient Name: Angela [name] Date of Encounter: 11/28/2018  Primary Cardiologist: Cristopher Peru, MD   Subjective   Events overnight noted. She has had recurrent VF with successful ICD shock. She has perineal pain. She is anxious.  Inpatient Medications    Scheduled Meds:  [MAR Hold] insulin aspart  0-9 Units Subcutaneous Q4H   [MAR Hold] mouth rinse  15 mL Mouth Rinse BID   [MAR Hold] potassium chloride  40 mEq Oral Daily   [MAR Hold] sodium chloride flush  3 mL Intravenous Q12H   Continuous Infusions:  amiodarone 60 mg/hr (11/28/18 0600)   lidocaine 0.5 mg/min (11/28/18 0600)   [MAR Hold] piperacillin-tazobactam (ZOSYN)  IV 12.5 mL/hr at 11/28/18 0600   [MAR Hold] vancomycin 1,000 mg (11/27/18 2348)   PRN Meds: [MAR Hold] acetaminophen **OR** [MAR Hold] acetaminophen   Vital Signs    Vitals:   11/28/18 0500 11/28/18 0600 11/28/18 0700 11/28/18 0800  BP: 116/73 118/80 112/78 130/82  Pulse: 75 74 74 71  Resp: 15 (!) 21 (!) 21 18  Temp:    97.9 F (36.6 C)  TempSrc:    Oral  SpO2: 98% 99% 99% 100%  Weight:      Height:        Intake/Output Summary (Last 24 hours) at 11/28/2018 0904 Last data filed at 11/28/2018 0800 Gross per 24 hour  Intake 1447.9 ml  Output 775 ml  Net 672.9 ml   Filed Weights   11/26/18 2335 11/27/18 0500 11/28/18 0413  Weight: 60.7 kg 60.7 kg 63.9 kg    Telemetry    VT/VF noted. Atrial fib with paced rhythm presently - Personally Reviewed  ECG    none - Personally Reviewed  Physical Exam   GEN: No acute distress.  Anxious appearing Neck: No JVD Cardiac: RRR, no murmurs, rubs, or gallops. Mechanical MV closure Respiratory: Clear to auscultation bilaterally. GI: Soft, nontender, non-distended  MS: No edema; No deformity. Neuro:  Nonfocal  Psych: Normal affect   Labs    Chemistry Recent Labs  Lab 11/26/18 1614 11/27/18 1440 11/27/18 2123 11/28/18 0152  NA 134* 138 134* 135  K 4.1 3.0* 3.2* 3.5  CL  99 106 100 104  CO2 20* 23 22 22   GLUCOSE 181* 93 115* 102*  BUN 17 8 9 9   CREATININE 0.97 0.70 0.76 0.72  CALCIUM 8.8* 7.8* 8.0* 8.1*  PROT 7.0  --   --   --   ALBUMIN 2.7*  --   --   --   AST 41  --   --   --   ALT 44  --   --   --   ALKPHOS 76  --   --   --   BILITOT 1.9*  --   --   --   GFRNONAA 58* >60 >60 >60  GFRAA >60 >60 >60 >60  ANIONGAP 15 9 12 9      Hematology Recent Labs  Lab 11/26/18 1614 11/27/18 1440 11/28/18 0152  WBC 20.2* 18.4* 16.8*  RBC 5.38* 4.04 4.09  HGB 15.3* 11.5* 11.7*  HCT 47.5* 35.6* 36.1  MCV 88.3 88.1 88.3  MCH 28.4 28.5 28.6  MCHC 32.2 32.3 32.4  RDW 15.0 14.8 14.9  PLT 299 240 225    Cardiac Enzymes Recent Labs  Lab 11/26/18 1614 11/26/18 2349 11/27/18 1440  TROPONINI 0.16* 0.40* 0.30*   No results for input(s): TROPIPOC in the last 168 hours.   BNPNo results for input(s): BNP,  PROBNP in the last 168 hours.   DDimer No results for input(s): DDIMER in the last 168 hours.   Radiology    Ct Abdomen Pelvis W Contrast  Result Date: 11/26/2018 CLINICAL DATA:  Anorectal abscess EXAM: CT ABDOMEN AND PELVIS WITH CONTRAST TECHNIQUE: Multidetector CT imaging of the abdomen and pelvis was performed using the standard protocol following bolus administration of intravenous contrast. CONTRAST:  151mL OMNIPAQUE IOHEXOL 300 MG/ML  SOLN COMPARISON:  Virtual colonoscopy dated 05/04/2014 FINDINGS: Motion degraded images. Lower chest: Cardiomegaly. Pacemaker leads, incompletely visualized. Hepatobiliary: Liver is within normal limits. Gallbladder is unremarkable. No intrahepatic or extrahepatic ductal dilatation. Pancreas: Within normal limits. Spleen: Within normal limits. Adrenals/Urinary Tract: Adrenal glands are within normal limits. Moderate right hydronephrosis with UPJ configuration, chronic. Overlying cortical thinning/scarring, chronic. Small left renal cysts, poorly evaluated due to motion degradation. Extrarenal pelvis. Bladder is within normal  limits. Stomach/Bowel: Stomach is within normal limits. No evidence of bowel obstruction. Status post partial right hemicolectomy with suture line in the right mid abdomen. Appendix is not discretely visualized. Extensive left colonic diverticulosis, without evidence of diverticulitis. No discrete perianal/perirectal fluid collection/abscess. However, there are multiple foci of gas in the right perineal and right medial gluteal region (series 3/images 85 and 90). In the absence of recent surgical intervention, this is worrisome for perineal infection, Fournier's gangrene not excluded. Vascular/Lymphatic: No evidence of abdominal aortic aneurysm. Atherosclerotic calcifications of the abdominal aorta and branch vessels. No suspicious abdominopelvic lymphadenopathy. Reproductive: Status post hysterectomy. No adnexal masses. Other: No abdominopelvic ascites. Musculoskeletal: Mild degenerative changes of the visualized thoracolumbar spine. IMPRESSION: No discrete perianal/perirectal fluid collection/abscess. Multiple foci of gas in the right perineal and right medial gluteal region. In the absence of recent surgical intervention, this is worrisome for perineal infection, Fournier's gangrene not excluded. Surgical consultation is advised. These results were called by telephone at the time of interpretation on 11/26/2018 at 10:30 pm to Dr. Isla Pence , who verbally acknowledged these results. Electronically Signed   By: Julian Hy M.D.   On: 11/26/2018 22:33   Dg Chest Port 1 View  Result Date: 11/26/2018 CLINICAL DATA:  Syncopal episode with loss of consciousness while in the waiting room of the surgical center earlier today. Indwelling biventricular pacing defibrillator. EXAM: PORTABLE CHEST 1 VIEW COMPARISON:  10/26/2018 and earlier. FINDINGS: Cardiac silhouette markedly enlarged, unchanged. Prior mitral valve replacement. LEFT subclavian biventricular pacing defibrillator unchanged. RIGHT subclavian dual  lead transvenous pacemaker unchanged. Lungs clear. Bronchovascular markings normal, with calcified tracheobronchial cartilages. Pulmonary vascularity normal. No visible pleural effusions. No pneumothorax. Stable BILATERAL lower lobe bronchiectasis. IMPRESSION: Stable marked cardiomegaly. No acute cardiopulmonary disease. Electronically Signed   By: Evangeline Dakin M.D.   On: 11/26/2018 16:12    Cardiac Studies   none  Patient Profile     73 y.o. female admitted with perineal infection and VT/VF storm.   Assessment & Plan    1. VT/VF - continue IV amiodarone as well as lidocaine at higher dose of IV amio. I would anticipate stopping IV lidocaine post op in next 24 hours if VT/VF quiet. 2. Chronic systolic heart failure - she has severe LV dysfunction. She appears to be euvolemic but her EF will make large fluid shifts difficult for her to deal with. 3. Perineal infection - hopefully the hardware in her vascular system/heart will not be involved. 4. Atrial fib - her ventricular rate is controlled. 5. Coags - she will need to be restarted on IV heparin once ok with  surgery service.      For questions or updates, please contact [place] Please consult [email or web address] for contact info under Cardiology/STEMI.      Signed, Cristopher Peru, MD  11/28/2018, 9:04 AM  Patient ID: [name], female   DOB: 09-03-1945, 73 y.o.   MRN: [id number]

## 2018-11-29 ENCOUNTER — Encounter (HOSPITAL_COMMUNITY): Payer: Self-pay | Admitting: General Surgery

## 2018-11-29 LAB — CBC
HCT: 33.8 % — ABNORMAL LOW (ref 36.0–46.0)
Hemoglobin: 11.2 g/dL — ABNORMAL LOW (ref 12.0–15.0)
MCH: 29.2 pg (ref 26.0–34.0)
MCHC: 33.1 g/dL (ref 30.0–36.0)
MCV: 88.3 fL (ref 80.0–100.0)
Platelets: 255 10*3/uL (ref 150–400)
RBC: 3.83 MIL/uL — ABNORMAL LOW (ref 3.87–5.11)
RDW: 15.2 % (ref 11.5–15.5)
WBC: 15.2 10*3/uL — ABNORMAL HIGH (ref 4.0–10.5)
nRBC: 0 % (ref 0.0–0.2)

## 2018-11-29 LAB — GLUCOSE, CAPILLARY
Glucose-Capillary: 117 mg/dL — ABNORMAL HIGH (ref 70–99)
Glucose-Capillary: 107 mg/dL — ABNORMAL HIGH (ref 70–99)
Glucose-Capillary: 99 mg/dL (ref 70–99)
Glucose-Capillary: 103 mg/dL — ABNORMAL HIGH (ref 70–99)
Glucose-Capillary: 130 mg/dL — ABNORMAL HIGH (ref 70–99)
Glucose-Capillary: 111 mg/dL — ABNORMAL HIGH (ref 70–99)

## 2018-11-29 LAB — PROTIME-INR
INR: 1.7 — ABNORMAL HIGH (ref 0.8–1.2)
Prothrombin Time: 19.6 seconds — ABNORMAL HIGH (ref 11.4–15.2)

## 2018-11-29 LAB — MAGNESIUM: Magnesium: 1.9 mg/dL (ref 1.7–2.4)

## 2018-11-29 LAB — HEPARIN LEVEL (UNFRACTIONATED): Heparin Unfractionated: 0.18 IU/mL — ABNORMAL LOW (ref 0.30–0.70)

## 2018-11-29 LAB — BASIC METABOLIC PANEL
Anion gap: 8 (ref 5–15)
BUN: 8 mg/dL (ref 8–23)
CO2: 22 mmol/L (ref 22–32)
Calcium: 8 mg/dL — ABNORMAL LOW (ref 8.9–10.3)
Chloride: 102 mmol/L (ref 98–111)
Creatinine, Ser: 0.82 mg/dL (ref 0.44–1.00)
GFR calc Af Amer: >60 mL/min (ref >60)
GFR calc non Af Amer: >60 mL/min (ref >60)
Glucose, Bld: 112 mg/dL — ABNORMAL HIGH (ref 70–99)
Potassium: 4 mmol/L (ref 3.5–5.1)
Sodium: 132 mmol/L — ABNORMAL LOW (ref 135–145)

## 2018-11-29 MED ORDER — CHLORHEXIDINE GLUCONATE CLOTH 2 % EX PADS
6.0000 | MEDICATED_PAD | Freq: Every day | CUTANEOUS | Status: DC
  Administered 2018-11-29 – 2018-12-05 (×7): 6 via TOPICAL

## 2018-11-29 MED ORDER — MORPHINE SULFATE (PF) 2 MG/ML IV SOLN
1.0000 mg | INTRAVENOUS | Status: DC | PRN
  Administered 2018-11-29: 1 mg via INTRAVENOUS
  Administered 2018-11-30 – 2018-12-02 (×5): 2 mg via INTRAVENOUS
  Administered 2018-12-02: 1 mg via INTRAVENOUS
  Administered 2018-12-06: 2 mg via INTRAVENOUS
  Filled 2018-11-29 (×9): qty 1

## 2018-11-29 MED ORDER — POLYETHYLENE GLYCOL 3350 17 G PO PACK
17.0000 g | PACK | Freq: Every day | ORAL | Status: DC
  Administered 2018-11-29 – 2018-11-30 (×2): 17 g via ORAL
  Filled 2018-11-29 (×2): qty 1

## 2018-11-29 MED ORDER — OXYCODONE HCL 5 MG PO TABS
5.0000 mg | ORAL_TABLET | ORAL | Status: DC | PRN
  Administered 2018-11-29: 10 mg via ORAL
  Administered 2018-11-29: 10:00:00 5 mg via ORAL
  Administered 2018-11-30 (×3): 10 mg via ORAL
  Administered 2018-11-30: 5 mg via ORAL
  Administered 2018-12-01 – 2018-12-05 (×6): 10 mg via ORAL
  Administered 2018-12-05 – 2018-12-06 (×2): 5 mg via ORAL
  Administered 2018-12-07: 10 mg via ORAL
  Administered 2018-12-07: 5 mg via ORAL
  Administered 2018-12-08 (×2): 10 mg via ORAL
  Filled 2018-11-29 (×4): qty 2
  Filled 2018-11-29 (×2): qty 1
  Filled 2018-11-29 (×8): qty 2
  Filled 2018-11-29: qty 1
  Filled 2018-11-29 (×2): qty 2
  Filled 2018-11-29: qty 1

## 2018-11-29 MED ORDER — HEPARIN (PORCINE) 25000 UT/250ML-% IV SOLN
1100.0000 [IU]/h | INTRAVENOUS | Status: DC
  Administered 2018-11-29: 850 [IU]/h via INTRAVENOUS
  Administered 2018-11-30: 1100 [IU]/h via INTRAVENOUS
  Administered 2018-12-01: 11:00:00 1050 [IU]/h via INTRAVENOUS
  Administered 2018-12-02 – 2018-12-03 (×2): 1100 [IU]/h via INTRAVENOUS
  Filled 2018-11-29 (×5): qty 250

## 2018-11-29 MED ORDER — SENNOSIDES-DOCUSATE SODIUM 8.6-50 MG PO TABS
1.0000 | ORAL_TABLET | Freq: Two times a day (BID) | ORAL | Status: DC
  Administered 2018-11-29 – 2018-11-30 (×4): 1 via ORAL
  Filled 2018-11-29 (×4): qty 1

## 2018-11-29 MED ORDER — DOCUSATE SODIUM 100 MG PO CAPS
100.0000 mg | ORAL_CAPSULE | Freq: Two times a day (BID) | ORAL | Status: DC
  Administered 2018-11-29: 10:00:00 100 mg via ORAL
  Filled 2018-11-29: qty 1

## 2018-11-29 NOTE — Plan of Care (Signed)
Elink:  Video phone call with family 

## 2018-11-29 NOTE — Progress Notes (Signed)
Progress Note  Patient Name: Angela [name] Date of Encounter: 11/29/2018  Primary Cardiologist: Cristopher Peru, MD   Subjective   She has some soreness in her rectal area but denies chest pain or sob.   Inpatient Medications    Scheduled Meds: . docusate sodium  100 mg Oral BID  . enoxaparin (LOVENOX) injection  40 mg Subcutaneous Q24H  . insulin aspart  0-9 Units Subcutaneous Q4H  . mouth rinse  15 mL Mouth Rinse BID  . potassium chloride  40 mEq Oral Daily  . sodium chloride flush  3 mL Intravenous Q12H   Continuous Infusions: . amiodarone 60 mg/hr (11/29/18 0800)  . lidocaine 0.5 mg/min (11/29/18 0800)  . piperacillin-tazobactam (ZOSYN)  IV 12.5 mL/hr at 11/29/18 0800  . vancomycin Stopped (11/28/18 2330)   PRN Meds: acetaminophen **OR** acetaminophen, morphine injection, oxyCODONE   Vital Signs    Vitals:   11/29/18 0600 11/29/18 0700 11/29/18 0746 11/29/18 0800  BP: 116/73 112/79  105/76  Pulse: 71 73  73  Resp: 16 14  13   Temp:   97.8 F (36.6 C)   TempSrc:   Oral   SpO2: 97% 99%  98%  Weight:      Height:        Intake/Output Summary (Last 24 hours) at 11/29/2018 0925 Last data filed at 11/29/2018 0800 Gross per 24 hour  Intake 2161.77 ml  Output 900 ml  Net 1261.77 ml   Filed Weights   11/27/18 0500 11/28/18 0413 11/29/18 0325  Weight: 60.7 kg 63.9 kg 68.6 kg    Telemetry    Atrial fib with biv pacing - Personally Reviewed  ECG    none - Personally Reviewed  Physical Exam   GEN: No acute distress.   Neck: No JVD Cardiac: RRR, no murmurs, rubs, or gallops.  Respiratory: Clear to auscultation bilaterally. GI: Soft, nontender, non-distended  GU - deferred MS: No edema; No deformity. Neuro:  Nonfocal  Psych: Normal affect   Labs    Chemistry Recent Labs  Lab 11/26/18 1614  11/27/18 2123 11/28/18 0152 11/29/18 0328  NA 134*   < > 134* 135 132*  K 4.1   < > 3.2* 3.5 4.0  CL 99   < > 100 104 102  CO2 20*   < > 22 22 22   GLUCOSE  181*   < > 115* 102* 112*  BUN 17   < > 9 9 8   CREATININE 0.97   < > 0.76 0.72 0.82  CALCIUM 8.8*   < > 8.0* 8.1* 8.0*  PROT 7.0  --   --   --   --   ALBUMIN 2.7*  --   --   --   --   AST 41  --   --   --   --   ALT 44  --   --   --   --   ALKPHOS 76  --   --   --   --   BILITOT 1.9*  --   --   --   --   GFRNONAA 58*   < > >60 >60 >60  GFRAA >60   < > >60 >60 >60  ANIONGAP 15   < > 12 9 8    < > = values in this interval not displayed.     Hematology Recent Labs  Lab 11/27/18 1440 11/28/18 0152 11/29/18 0328  WBC 18.4* 16.8* 15.2*  RBC 4.04 4.09 3.83*  HGB 11.5* 11.7*  11.2*  HCT 35.6* 36.1 33.8*  MCV 88.1 88.3 88.3  MCH 28.5 28.6 29.2  MCHC 32.3 32.4 33.1  RDW 14.8 14.9 15.2  PLT 240 225 255    Cardiac Enzymes Recent Labs  Lab 11/26/18 1614 11/26/18 2349 11/27/18 1440  TROPONINI 0.16* 0.40* 0.30*   No results for input(s): TROPIPOC in the last 168 hours.   BNPNo results for input(s): BNP, PROBNP in the last 168 hours.   DDimer No results for input(s): DDIMER in the last 168 hours.   Radiology    No results found.  Cardiac Studies   none  Patient Profile     73 y.o. female admitted with VT and syncope in the setting of GU/rectal abscess/gangrene  Assessment & Plan    1. VT - continue IV amiodarone. We will stop IV lidocaine. 2. Chronic systolic heart failure - she appears to be euvolemic. Contineu her current meds. 3. Atrial fib - her rates are controlled. 4. Infection - s/p surgery. Continue anti-biotics.  For questions or updates, please contact [place] Please consult [email or web address] for contact info under Cardiology/STEMI.   Signed, Cristopher Peru, MD  11/29/2018, 9:25 AM  Patient ID: Angela [name], female   DOB: Jul 07, 1946, 73 y.o.   MRN: [id number]

## 2018-11-29 NOTE — Progress Notes (Addendum)
Tele video call provided to patients sister diane

## 2018-11-29 NOTE — Progress Notes (Signed)
ANTICOAGULATION CONSULT NOTE - Initial Consult  Pharmacy Consult for heparin Indication: AFib, mechanical MVR, hx DVT  No Known Allergies  Patient Measurements: Height: 5\' 6"  (167.6 cm) Weight: 151 lb 3.8 oz (68.6 kg) IBW/kg (Calculated) : 59.3 Heparin Dosing Weight: 60kg  Vital Signs: Temp: 97.8 F (36.6 C) (04/05 0746) Temp Source: Oral (04/05 0746) BP: 117/77 (04/05 0900) Pulse Rate: 72 (04/05 0900)  Labs: Recent Labs    11/26/18 1614  11/26/18 2349 11/27/18 1440 11/27/18 2123 11/28/18 0152 11/29/18 0328  HGB 15.3*  --   --  11.5*  --  11.7* 11.2*  HCT 47.5*  --   --  35.6*  --  36.1 33.8*  PLT 299  --   --  240  --  225 255  LABPROT  --    < >  --  16.8*  --  17.0* 19.6*  INR  --    < >  --  1.4*  --  1.4* 1.7*  CREATININE 0.97  --   --  0.70 0.76 0.72 0.82  TROPONINI 0.16*  --  0.40* 0.30*  --   --   --    < > = values in this interval not displayed.    Estimated Creatinine Clearance: 56.3 mL/min (by C-G formula based on SCr of 0.82 mg/dL).   Assessment: 5 yoF on warfarin PTA for PAF, hx DVT, and mechanical MVR. Pt admitted with supratherapeutic INR of 9.1 s/p FFP and vitamin K due to urgent need for I&D on 4/4. Surgery okayed resuming heparin infusion - will avoid boluses and target low goal given recent surgery.  Goal of Therapy:  Heparin level 0.3-0.5 units/ml Monitor platelets by anticoagulation protocol: Yes   Plan:  -Stop enoxaparin for VTE ppx -Heparin 850 units/h -Check 8hr heparin level  Arrie Senate, PharmD, BCPS Clinical Pharmacist [phone] Please check AMION for all [place] numbers 11/29/2018

## 2018-11-29 NOTE — Progress Notes (Addendum)
Patient ID: Angela [name], female   DOB: September 13, 1945, 73 y.o.   MRN: [id number]   Acute Care Surgery Service Progress Note:    Chief Complaint/Subjective: No major events. No bleeding. Lidocaine gtt off  Objective: Vital signs in last 24 hours: Temp:  [97.6 F (36.4 C)-98.2 F (36.8 C)] 97.8 F (36.6 C) (04/05 0746) Pulse Rate:  [68-77] 72 (04/05 0900) Resp:  [9-26] 18 (04/05 0900) BP: (86-133)/(67-87) 117/77 (04/05 0900) SpO2:  [94 %-100 %] 100 % (04/05 0900) Arterial Line BP: (89-138)/(44-74) 128/62 (04/05 0900) Weight:  [68.6 kg] 68.6 kg (04/05 0325) Last BM Date: 11/26/18  Intake/Output from previous day: 04/04 0701 - 04/05 0700 In: 2264.4 [P.O.:340; I.V.:1446.1; IV Piggyback:478.3] Out: 1425 [RFFMB:8466; Blood:50] Intake/Output this shift: Total I/O In: 56.4 [I.V.:43.9; IV Piggyback:12.5] Out: -   Lungs: cta, nonlabored  Cardiovascular: reg  Abd: soft, nt  Extremities: no edema, +SCDs  Neuro: alert, nonfocal, pleasant dementia  Rectal/buttock - see picture     Lab Results: CBC  Recent Labs    11/28/18 0152 11/29/18 0328  WBC 16.8* 15.2*  HGB 11.7* 11.2*  HCT 36.1 33.8*  PLT 225 255   BMET Recent Labs    11/28/18 0152 11/29/18 0328  NA 135 132*  K 3.5 4.0  CL 104 102  CO2 22 22  GLUCOSE 102* 112*  BUN 9 8  CREATININE 0.72 0.82  CALCIUM 8.1* 8.0*   LFT Hepatic Function Latest Ref Rng & Units 11/26/2018 04/28/2017 10/24/2010  Total Protein 6.5 - 8.1 g/dL 7.0 6.0(L) 6.6  Albumin 3.5 - 5.0 g/dL 2.7(L) 3.5 3.5  AST 15 - 41 U/L 41 21 22  ALT 0 - 44 U/L 44 15 16  Alk Phosphatase 38 - 126 U/L 76 42 62  Total Bilirubin 0.3 - 1.2 mg/dL 1.9(H) 0.6 0.7   PT/INR Recent Labs    11/28/18 0152 11/29/18 0328  LABPROT 17.0* 19.6*  INR 1.4* 1.7*   ABG No results for input(s): PHART, HCO3 in the last 72 hours.  Invalid input(s): PCO2, PO2  Studies/Results:  Anti-infectives: Anti-infectives (From admission, onward)   Start     Dose/Rate Route  Frequency Ordered Stop   11/27/18 2300  vancomycin (VANCOCIN) 1,000 mg in sodium chloride 0.9 % 250 mL IVPB     1,000 mg 250 mL/hr over 60 Minutes Intravenous Every 24 hours 11/26/18 2336     11/27/18 0600  piperacillin-tazobactam (ZOSYN) IVPB 3.375 g     3.375 g 12.5 mL/hr over 240 Minutes Intravenous Every 8 hours 11/26/18 2336     11/26/18 2330  vancomycin (VANCOCIN) 1,250 mg in sodium chloride 0.9 % 250 mL IVPB     1,250 mg 166.7 mL/hr over 90 Minutes Intravenous STAT 11/26/18 2324 11/27/18 0345   11/26/18 2230  piperacillin-tazobactam (ZOSYN) IVPB 3.375 g     3.375 g 100 mL/hr over 30 Minutes Intravenous  Once 11/26/18 2218 11/26/18 2302      Medications: Scheduled Meds: . enoxaparin (LOVENOX) injection  40 mg Subcutaneous Q24H  . insulin aspart  0-9 Units Subcutaneous Q4H  . mouth rinse  15 mL Mouth Rinse BID  . polyethylene glycol  17 g Oral Daily  . potassium chloride  40 mEq Oral Daily  . senna-docusate  1 tablet Oral BID  . sodium chloride flush  3 mL Intravenous Q12H   Continuous Infusions: . amiodarone 60 mg/hr (11/29/18 0800)  . piperacillin-tazobactam (ZOSYN)  IV 12.5 mL/hr at 11/29/18 0800  . vancomycin Stopped (11/28/18 2330)  PRN Meds:.acetaminophen **OR** acetaminophen, morphine injection, oxyCODONE  Assessment/Plan: Patient Active Problem List   Diagnosis Date Noted  . Syncope 11/26/2018  . Ventricular tachycardia ([place]) 11/26/2018  . Elevated INR 11/26/2018  . Perineal abscess 11/26/2018  . Acute on chronic systolic (congestive) heart failure ([place]) 10/27/2018  . Elevated troponin 10/26/2018  . Anemia 10/26/2018  . Palliative care by specialist   . Rectal bleeding 04/28/2017  . Acute blood loss anemia 04/28/2017  . ICD (implantable cardioverter-defibrillator), biventricular, in situ 08/24/2014  . Memory deficit 08/23/2014  . Abnormality of gait 08/23/2014  . Dementia ([place]) 12/17/2013  . 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  Hypotension due to drugs 07/12/2012  . Hypokalemia 07/12/2012  . NSVT (nonsustained ventricular tachycardia) ([place]) 07/12/2012  . Acute on chronic systolic heart failure ([place]) 07/07/2012  . Ventricular fibrillation ([place]) 11/11/2011  . Warfarin anticoagulation 09/22/2011  . Nonischemic cardiomyopathy ([place])   . Atrial fibrillation ([place])   . History of colon cancer   . Internal hemorrhoids   . Rheumatic heart disease   . Sick sinus syndrome ([place])   . Systolic CHF, chronic ([place])   . DIVERTICULAR DISEASE 06/05/2009   s/p Procedure(s): IRRIGATION AND DEBRIDEMENT PERIRECTAL ABSCESS/BUTTOCK for Fournier's gangrene 11/28/2018 Dr Redmond Pulling  Did dressing change this am. Wound ok. 1 area of skin that is questionable but soft tissue ok. Still with some scant purulent drainage.   Start bid dressing changes - can premedicate with oral pain med, IV for breakthru Cont IV abx - f/u operative cultures Can resume heparin gtt for anticoag given mechanical valves. Monitor for bleeding.   Adv diet OOB PT/OT consult    Disposition: tx to progessive at discretion of other service lines. Aline can be removed from my perspective  LOS: 3 days    Leighton Ruff. Redmond Pulling, MD, FACS General, Bariatric, & Minimally Invasive Surgery [phone] [facility]

## 2018-11-29 NOTE — Progress Notes (Addendum)
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, Hx of Vfib arrest, LBBB  S/p [employer] biventricular ICD placement, atrial fibrillation, and memory deficit  History is limited from patient as she does not recall the episode and only answers with simple yes/no  She was at the surgeon's office for evaluation of a perirectal infection, CT noted showed multiple foci of gas She had a syncopal episode with icd shock at the surgical center, she is sent to the ER  Cardiology consulted, she is started on amiodarone drip She is found to have elevated INR, she received vit k and FFP on admission per general surgery recommendation   HPI/Recap of past 24 hours:   POD#1, had an uneventful night, currently denies pain, no sob, no fever  She is currently off lidocaine drip, she remained on amiodarone drip   Assessment/Plan: Principal Problem:   Syncope Active Problems:   Systolic CHF, chronic (HCC)   Ventricular fibrillation (HCC)   S/P Mechanical MVR (mitral valve replacement)   ICD (implantable cardioverter-defibrillator), biventricular, in situ   Elevated troponin   Ventricular tachycardia (HCC)   Elevated INR   Perineal abscess  Right perineal/perirectal abscess with presence of gas/possible Fournier's gangrene - Incision, drainage & Excisional DEBRIDEMENT with scalpel of right Perineal/buttock Fournier's gangrene; pulsatile lavage on 4/4 -may start chemical  prophylaxis but WOULD NOT start any type of gtt today or tonight due to bleeding and risk of bleeding from OR per general surgery, full anticoagulation resumption per general surgery -blood cultures no growth, mrsa screening negative, wound culture in process, she is Started IV vancomycin and Zosyn since admission, will follow general surgery recommendation regarding abx  choice and duration  syncope/Ventricular tachyarrhythmia with ventricular fibrillation s/p ICD shock x5: She is transferred to cardiac ICU last night due to recurrent vfib while on  IV amiodarone drip, she is started on lidocaine drip  currently off lidocaine drip, continue on amiodarone drip. Cardiology /EP consulted, will discuss with cardiology to transfer patient to cardiology service now that she is in cardiac ICU.   Afib, SSS s/p PPM, s/p mechanical MVR on coumadin, with supratherapeutic INR on presentation INR reversed for surgery, resume anticoagulation when oks with general surgery  Nonischemic cardiomyopathy, EF 15% s/o biVICD -Recent hospitalization for heart failure from 3/2 to 3/4 -currently euvolemic to dry -close monitor volume status, cardiology consulted   Dementia/FTT -she had h/o colon cancer, she has h/o gi bleed, palliative care involved in 04/2017, family was not receptive to palliative care at the time per chart review  Code Status: full  Family Communication: patient   Disposition Plan: not ready to discharge   Consultants:  Cardiology/EP  General surgery  Procedures:  Incision, drainage & Excisional DEBRIDEMENT with scalpel of right Perineal/buttock Fournier's gangrene; pulsatile lavage on 4/4  Antibiotics:  Vanc/zosyn from admission   Objective: BP 117/77   Pulse 72   Temp 97.8 F (36.6 C) (Oral)   Resp 18   Ht 5\' 6"  (1.676 m)   Wt 68.6 kg   SpO2 100%   BMI 24.41 kg/m   Intake/Output Summary (Last 24 hours) at 11/29/2018 1016 Last data filed at 11/29/2018 0947 Gross per 24 hour  Intake 2164.77 ml  Output 900 ml  Net 1264.77 ml   Filed Weights   11/27/18 0500 11/28/18 0413 11/29/18 0325  Weight: 60.7 kg  63.9 kg 68.6 kg    Exam: Patient is examined daily including today on 11/29/2018, exams remain the same as of yesterday except that has changed    General:  Chronically ill, appear more awake, answers simple questions   Cardiovascular: paced rhythm  Respiratory: diminished, no wheezing, no rales, no rhonchi  Abdomen: post op changes (perioneal) , positive BS  Musculoskeletal: No Edema  Neuro: awake, answers simple questions   Data Reviewed: Basic Metabolic Panel: Recent Labs  Lab 11/26/18 1614 11/27/18 1440 11/27/18 2123 11/28/18 0152 11/29/18 0328  NA 134* 138 134* 135 132*  K 4.1 3.0* 3.2* 3.5 4.0  CL 99 106 100 104 102  CO2 20* 23 22 22 22   GLUCOSE 181* 93 115* 102* 112*  BUN 17 8 9 9 8   CREATININE 0.97 0.70 0.76 0.72 0.82  CALCIUM 8.8* 7.8* 8.0* 8.1* 8.0*  MG 1.8 1.9 2.1  --  1.9   Liver Function Tests: Recent Labs  Lab 11/26/18 1614  AST 41  ALT 44  ALKPHOS 76  BILITOT 1.9*  PROT 7.0  ALBUMIN 2.7*   No results for input(s): LIPASE, AMYLASE in the last 168 hours. No results for input(s): AMMONIA in the last 168 hours. CBC: Recent Labs  Lab 11/26/18 1614 11/27/18 1440 11/28/18 0152 11/29/18 0328  WBC 20.2* 18.4* 16.8* 15.2*  NEUTROABS 18.5* 16.9*  --   --   HGB 15.3* 11.5* 11.7* 11.2*  HCT 47.5* 35.6* 36.1 33.8*  MCV 88.3 88.1 88.3 88.3  PLT 299 240 225 255   Cardiac Enzymes:   Recent Labs  Lab 11/26/18 1614 11/26/18 2349 11/27/18 1440  TROPONINI 0.16* 0.40* 0.30*   BNP (last 3 results) Recent Labs    10/26/18 1855  BNP 1,092.7*    ProBNP (last 3 results) No results for input(s): PROBNP in the last 8760 hours.  CBG: Recent Labs  Lab 11/28/18 0807 11/28/18 1132 11/28/18 1639 11/29/18 0323 11/29/18 0744  GLUCAP 106* 106* 117* 107* 99    Recent Results (from the past 240 hour(s))  Culture, blood (Routine X 2) w Reflex to ID Panel     Status: None (Preliminary result)   Collection Time: 11/26/18 11:45 PM  Result Value Ref Range Status   Specimen Description BLOOD RIGHT HAND  Final   Special Requests   Final    BOTTLES DRAWN AEROBIC ONLY Blood Culture adequate volume   Culture   Final    NO GROWTH 1 DAY Performed at [facility],  [place] [address] [place], [place] [address]    Report Status PENDING  Incomplete  Culture, blood (Routine X 2) w Reflex to ID Panel     Status: None (Preliminary result)   Collection Time: 11/26/18 11:59 PM  Result Value Ref Range Status   Specimen Description BLOOD LEFT HAND  Final   Special Requests   Final    BOTTLES DRAWN AEROBIC ONLY Blood Culture adequate volume   Culture   Final    NO GROWTH 1 DAY Performed at [facility], [place] [address] [place], [place] [address]    Report Status PENDING  Incomplete  MRSA PCR Screening     Status: None   Collection Time: 11/27/18 12:41 AM  Result Value Ref Range Status   MRSA by PCR NEGATIVE NEGATIVE Final    Comment:        The GeneXpert MRSA Assay (FDA approved for NASAL specimens only), is one component of a comprehensive MRSA colonization surveillance program.  It is not intended to diagnose MRSA infection nor to guide or monitor treatment for MRSA infections. Performed at [facility], [place] [address], [place], [place] [address]   Aerobic/Anaerobic Culture (surgical/deep wound)     Status: None (Preliminary result)   Collection Time: 11/28/18 10:32 AM  Result Value Ref Range Status   Specimen Description ABSCESS BUTTOCKS  Final   Special Requests PATIENT ON FOLLOWING VANC AND ZOSYN  Final   Gram Stain   Final    NO WBC SEEN RARE GRAM POSITIVE COCCI IN PAIRS RARE GRAM POSITIVE RODS Performed at [facility], [address] [place], [place] [address]    Culture PENDING  Incomplete   Report Status PENDING  Incomplete     Studies: No results found.  Scheduled Meds: . docusate sodium  100 mg Oral BID  . enoxaparin (LOVENOX) injection  40 mg Subcutaneous Q24H  . insulin aspart  0-9 Units Subcutaneous Q4H  . mouth rinse  15 mL Mouth Rinse BID  . potassium chloride  40 mEq Oral Daily  . sodium chloride flush  3 mL Intravenous Q12H    Continuous Infusions: . amiodarone 60 mg/hr (11/29/18 0800)  .  piperacillin-tazobactam (ZOSYN)  IV 12.5 mL/hr at 11/29/18 0800  . vancomycin Stopped (11/28/18 2330)     Time spent: 43mins I have personally reviewed and interpreted on  11/29/2018 daily labs, tele strips, imagings as discussed above under date review session and assessment and plans.  I reviewed all nursing notes, pharmacy notes, consultant notes,  vitals, pertinent old records  I have discussed plan of care as described above with RN , patient on 11/29/2018   Florencia Reasons MD, PhD  [facility] Pager [phone]. If 7PM-7AM, please contact night-coverage at [email or web address], password [facility] 11/29/2018, 10:16 AM  LOS: 3 days

## 2018-11-29 NOTE — Progress Notes (Signed)
I, elink RN intially placed email with regards to pt's sister with no response for communication of video camera, I then called pt's nurse and then received message from 2h secretary giving me the phone number for sister, I programmed number in, the picture of sister came up on screen but I could hear her voice and see her face but she never responded to my or bedside voice, she could verbally be heard saying she would call back tomorrow during day, I could hear her say  she could call three times , I did speak with bedside RN agreeing to same conversation, also had a hard time seeing sisters face, angle of her phone made it hard to see her face

## 2018-11-29 NOTE — Progress Notes (Signed)
ANTICOAGULATION CONSULT NOTE - Initial Consult  Pharmacy Consult for heparin Indication: AFib, mechanical MVR, hx DVT  No Known Allergies  Patient Measurements: Height: 5\' 6"  (167.6 cm) Weight: 151 lb 3.8 oz (68.6 kg) IBW/kg (Calculated) : 59.3 Heparin Dosing Weight: 60kg  Vital Signs: Temp: 97.9 F (36.6 C) (04/05 2000) Temp Source: Oral (04/05 2000) BP: 104/72 (04/05 1900) Pulse Rate: 73 (04/05 1900)  Labs: Recent Labs    11/26/18 2349  11/27/18 1440 11/27/18 2123 11/28/18 0152 11/29/18 0328 11/29/18 2013  HGB  --    < > 11.5*  --  11.7* 11.2*  --   HCT  --   --  35.6*  --  36.1 33.8*  --   PLT  --   --  240  --  225 255  --   LABPROT  --   --  16.8*  --  17.0* 19.6*  --   INR  --   --  1.4*  --  1.4* 1.7*  --   HEPARINUNFRC  --   --   --   --   --   --  0.18*  CREATININE  --    < > 0.70 0.76 0.72 0.82  --   TROPONINI 0.40*  --  0.30*  --   --   --   --    < > = values in this interval not displayed.    Estimated Creatinine Clearance: 56.3 mL/min (by C-G formula based on SCr of 0.82 mg/dL).   Assessment: 53 yoF on warfarin PTA for PAF, hx DVT, and mechanical MVR. Pt admitted with supratherapeutic INR of 9.1 s/p FFP and vitamin K due to urgent need for I&D on 4/4. Surgery okayed resuming heparin infusion - will avoid boluses and target low goal given recent surgery.  4/5 PM: Heparin level this evening 0.18. No infusion issues or bleeding per RN. CBC stable.  Goal of Therapy:  Heparin level 0.3-0.5 units/ml Monitor platelets by anticoagulation protocol: Yes   Plan:  -Increase heparin to 1000 units/hr -Check 8hr heparin level -Daily CBC, Heparin Level  Thank you for involving pharmacy in this patient's care.  Janae Bridgeman, PharmD PGY1 Pharmacy Resident Phone: [phone] 11/29/2018 8:48 PM

## 2018-11-29 NOTE — Progress Notes (Signed)
Pharmacy Antibiotic Note  Angela [name] is a 73 y.o. 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  Pharmacy has been consulted for Zosyn and Vancomycin dosing. Pt now s/p I&D in OR on 4/4, remains on IV antibiotics. Renal function has remained stable, VT quiet.  Plan: -Continue Zosyn 3.375g IV EI q8h -Continue vancomycin 1000mg  IV q24h -Will obtain vancomycin levels tonight   Height: 5\' 6"  (167.6 cm) Weight: 151 lb 3.8 oz (68.6 kg) IBW/kg (Calculated) : 59.3  Temp (24hrs), Avg:97.9 F (36.6 C), Min:97.6 F (36.4 C), Max:98.2 F (36.8 C)  Recent Labs  Lab 11/26/18 1614 11/27/18 1440 11/27/18 2123 11/28/18 0152 11/29/18 0328  WBC 20.2* 18.4*  --  16.8* 15.2*  CREATININE 0.97 0.70 0.76 0.72 0.82    Estimated Creatinine Clearance: 56.3 mL/min (by C-G formula based on SCr of 0.82 mg/dL).    No Known Allergies  Antimicrobials this admission: 4/2 Zosyn >>  4/2 Vancomycin >>   Microbiology results: Pending  Thank you for allowing pharmacy to be a part of this patient's care.  Arrie Senate, PharmD, BCPS Clinical Pharmacist [phone] Please check AMION for all [facility] numbers 11/29/2018

## 2018-11-29 NOTE — Plan of Care (Signed)
Patient alert, denies pain at present.  Drainage noted on pad.  Reinforced.  Monitoring.

## 2018-11-29 NOTE — Progress Notes (Signed)
Patient's sister attempting to talk to pt via [place] video.  Could see the top of her head on the screen , and hear her talking on her phone.  I was attempting to instruct her to tilt the camera down to get a better view of her.  Clement Husbands, RN was also trying to instruct her on what to do, and asking if she could hear her as well.  There was difficulty in communicating with her.  I could hear her clearly through [facility] video, but she was not able to understand me.  She stated that "I been told two different things, that I could only call once a day, and was told today I could call 3 times a day, but I won't call any names. I'll just call tomorrow during the day since it was going to be after 9 o'clock just let her (pt) get some sleep." She did not appear to be able to hear me, or see into the room.  Her face then came into view on her tablet, but then she disconnected the call.

## 2018-11-30 ENCOUNTER — Inpatient Hospital Stay (HOSPITAL_COMMUNITY): Payer: Medicare HMO

## 2018-11-30 ENCOUNTER — Other Ambulatory Visit: Payer: Self-pay

## 2018-11-30 DIAGNOSIS — K611 Rectal abscess: Secondary | ICD-10-CM

## 2018-11-30 LAB — BASIC METABOLIC PANEL
Anion gap: 8 (ref 5–15)
BUN: 9 mg/dL (ref 8–23)
CO2: 20 mmol/L — ABNORMAL LOW (ref 22–32)
Calcium: 8.1 mg/dL — ABNORMAL LOW (ref 8.9–10.3)
Chloride: 102 mmol/L (ref 98–111)
Creatinine, Ser: 0.96 mg/dL (ref 0.44–1.00)
GFR calc Af Amer: >60 mL/min (ref >60)
GFR calc non Af Amer: 58 mL/min — ABNORMAL LOW (ref >60)
Glucose, Bld: 124 mg/dL — ABNORMAL HIGH (ref 70–99)
Potassium: 4.3 mmol/L (ref 3.5–5.1)
Sodium: 130 mmol/L — ABNORMAL LOW (ref 135–145)

## 2018-11-30 LAB — HEPARIN LEVEL (UNFRACTIONATED)
Heparin Unfractionated: 0.26 IU/mL — ABNORMAL LOW (ref 0.30–0.70)
Heparin Unfractionated: 0.45 IU/mL (ref 0.30–0.70)

## 2018-11-30 LAB — VANCOMYCIN, PEAK: Vancomycin Pk: 28 ug/mL — ABNORMAL LOW (ref 30–40)

## 2018-11-30 LAB — CBC
HCT: 36.1 % (ref 36.0–46.0)
Hemoglobin: 12 g/dL (ref 12.0–15.0)
MCH: 29.1 pg (ref 26.0–34.0)
MCHC: 33.2 g/dL (ref 30.0–36.0)
MCV: 87.6 fL (ref 80.0–100.0)
Platelets: 274 10*3/uL (ref 150–400)
RBC: 4.12 MIL/uL (ref 3.87–5.11)
RDW: 15.3 % (ref 11.5–15.5)
WBC: 13.9 10*3/uL — ABNORMAL HIGH (ref 4.0–10.5)
nRBC: 0 % (ref 0.0–0.2)

## 2018-11-30 LAB — GLUCOSE, CAPILLARY
Glucose-Capillary: 119 mg/dL — ABNORMAL HIGH (ref 70–99)
Glucose-Capillary: 109 mg/dL — ABNORMAL HIGH (ref 70–99)
Glucose-Capillary: 120 mg/dL — ABNORMAL HIGH (ref 70–99)
Glucose-Capillary: 127 mg/dL — ABNORMAL HIGH (ref 70–99)
Glucose-Capillary: 125 mg/dL — ABNORMAL HIGH (ref 70–99)
Glucose-Capillary: 97 mg/dL (ref 70–99)

## 2018-11-30 LAB — PROTIME-INR
INR: 1.7 — ABNORMAL HIGH (ref 0.8–1.2)
Prothrombin Time: 19.7 seconds — ABNORMAL HIGH (ref 11.4–15.2)

## 2018-11-30 MED ORDER — LIP MEDEX EX OINT
TOPICAL_OINTMENT | CUTANEOUS | Status: DC | PRN
  Administered 2018-12-01 – 2018-12-02 (×2): via TOPICAL
  Filled 2018-11-30: qty 7

## 2018-11-30 MED ORDER — ENSURE ENLIVE PO LIQD
237.0000 mL | Freq: Three times a day (TID) | ORAL | Status: DC
  Administered 2018-11-30 – 2018-12-07 (×14): 237 mL via ORAL

## 2018-11-30 MED ORDER — BISACODYL 10 MG RE SUPP
10.0000 mg | Freq: Every day | RECTAL | Status: AC
  Administered 2018-11-30: 10 mg via RECTAL
  Filled 2018-11-30: qty 1

## 2018-11-30 MED ORDER — ONDANSETRON HCL 4 MG/2ML IJ SOLN
4.0000 mg | Freq: Four times a day (QID) | INTRAMUSCULAR | Status: DC
  Filled 2018-11-30: qty 2

## 2018-11-30 MED ORDER — ADULT MULTIVITAMIN W/MINERALS CH
1.0000 | ORAL_TABLET | Freq: Every day | ORAL | Status: DC
  Administered 2018-11-30 – 2018-12-08 (×9): 1 via ORAL
  Filled 2018-11-30 (×9): qty 1

## 2018-11-30 MED ORDER — PRO-STAT SUGAR FREE PO LIQD
30.0000 mL | Freq: Two times a day (BID) | ORAL | Status: DC
  Administered 2018-11-30 – 2018-12-08 (×15): 30 mL via ORAL
  Filled 2018-11-30 (×16): qty 30

## 2018-11-30 MED ORDER — ONDANSETRON HCL 4 MG/2ML IJ SOLN
4.0000 mg | Freq: Four times a day (QID) | INTRAMUSCULAR | Status: DC | PRN
  Administered 2018-11-30 – 2018-12-06 (×4): 4 mg via INTRAVENOUS
  Filled 2018-11-30 (×4): qty 2

## 2018-11-30 MED ORDER — MAGNESIUM HYDROXIDE 400 MG/5ML PO SUSP
5.0000 mL | Freq: Once | ORAL | Status: AC
  Administered 2018-11-30: 5 mL via ORAL
  Filled 2018-11-30: qty 30

## 2018-11-30 NOTE — Progress Notes (Signed)
ANTICOAGULATION CONSULT NOTE  Pharmacy Consult for heparin Indication: AFib, mechanical MVR, hx DVT  No Known Allergies  Patient Measurements: Height: 5\' 6"  (167.6 cm) Weight: 149 lb 11.1 oz (67.9 kg) IBW/kg (Calculated) : 59.3 Heparin Dosing Weight: 60kg  Vital Signs: Temp: 97.6 F (36.4 C) (04/06 0424) Temp Source: Oral (04/06 0424) BP: 112/77 (04/06 0500) Pulse Rate: 73 (04/06 0500)  Labs: Recent Labs    11/27/18 1440  11/28/18 0152 11/29/18 0328 11/29/18 2013 11/30/18 0113 11/30/18 0444  HGB 11.5*  --  11.7* 11.2*  --  12.0  --   HCT 35.6*  --  36.1 33.8*  --  36.1  --   PLT 240  --  225 255  --  274  --   LABPROT 16.8*  --  17.0* 19.6*  --  19.7*  --   INR 1.4*  --  1.4* 1.7*  --  1.7*  --   HEPARINUNFRC  --   --   --   --  0.18*  --  0.26*  CREATININE 0.70   < > 0.72 0.82  --  0.96  --   TROPONINI 0.30*  --   --   --   --   --   --    < > = values in this interval not displayed.    Estimated Creatinine Clearance: 48.1 mL/min (by C-G formula based on SCr of 0.96 mg/dL).   Assessment: 47 yoF on warfarin PTA for PAF, hx DVT, and mechanical MVR. Pt admitted with supratherapeutic INR of 9.1 s/p FFP and vitamin K due to urgent need for I&D on 4/4. Surgery okayed resuming heparin infusion - will avoid boluses and target low goal given recent surgery.  Heparin level 0.26 units/ml  No bleeding noted  Goal of Therapy:  Heparin level 0.3-0.5 units/ml Monitor platelets by anticoagulation protocol: Yes   Plan:  -Increase heparin to 1100 units/hr -Daily CBC, Heparin Level  Thanks for allowing pharmacy to be a part of this patient's care.  Excell Seltzer, PharmD Clinical Pharmacist

## 2018-11-30 NOTE — Progress Notes (Signed)
[facility] Progress Note  2 Days Post-Op  Subjective: CC: wound Patient reports wound is tender. Per RN no BM yet. Foley in place.  Objective: Vital signs in last 24 hours: Temp:  [97.4 F (36.3 C)-97.9 F (36.6 C)] 97.6 F (36.4 C) (04/06 0424) Pulse Rate:  [70-77] 76 (04/06 0800) Resp:  [10-41] 18 (04/06 0800) BP: (83-129)/(57-94) 102/82 (04/06 0800) SpO2:  [92 %-100 %] 96 % (04/06 0800) Arterial Line BP: (110-174)/(51-97) 110/62 (04/05 1400) Weight:  [67.9 kg] 67.9 kg (04/06 0411) Last BM Date: 11/26/18  Intake/Output from previous day: 04/05 0701 - 04/06 0700 In: 1305.8 [P.O.:170; I.V.:993.1; IV Piggyback:142.6] Out: 1130 [Urine:1130] Intake/Output this shift: No intake/output data recorded.  PE: Gen:  Alert, NAD, pleasant Card:  Regular rate and rhythm Pulm:  Normal effort, clear to auscultation bilaterally Abd: Soft, non-tender, non-distended, +BS GU: foley present, wound as below      Lab Results:  Recent Labs    11/29/18 0328 11/30/18 0113  WBC 15.2* 13.9*  HGB 11.2* 12.0  HCT 33.8* 36.1  PLT 255 274   BMET Recent Labs    11/29/18 0328 11/30/18 0113  NA 132* 130*  K 4.0 4.3  CL 102 102  CO2 22 20*  GLUCOSE 112* 124*  BUN 8 9  CREATININE 0.82 0.96  CALCIUM 8.0* 8.1*   PT/INR Recent Labs    11/29/18 0328 11/30/18 0113  LABPROT 19.6* 19.7*  INR 1.7* 1.7*   CMP     Component Value Date/Time   NA 130 (L) 11/30/2018 0113   K 4.3 11/30/2018 0113   CL 102 11/30/2018 0113   CO2 20 (L) 11/30/2018 0113   GLUCOSE 124 (H) 11/30/2018 0113   BUN 9 11/30/2018 0113   CREATININE 0.96 11/30/2018 0113   CALCIUM 8.1 (L) 11/30/2018 0113   PROT 7.0 11/26/2018 1614   ALBUMIN 2.7 (L) 11/26/2018 1614   AST 41 11/26/2018 1614   ALT 44 11/26/2018 1614   ALKPHOS 76 11/26/2018 1614   BILITOT 1.9 (H) 11/26/2018 1614   GFRNONAA 58 (L) 11/30/2018 0113   GFRAA >60 11/30/2018 0113   Lipase  No results found for: LIPASE      Studies/Results: No results found.  Anti-infectives: Anti-infectives (From admission, onward)   Start     Dose/Rate Route Frequency Ordered Stop   11/27/18 2300  vancomycin (VANCOCIN) 1,000 mg in sodium chloride 0.9 % 250 mL IVPB     1,000 mg 250 mL/hr over 60 Minutes Intravenous Every 24 hours 11/26/18 2336     11/27/18 0600  piperacillin-tazobactam (ZOSYN) IVPB 3.375 g     3.375 g 12.5 mL/hr over 240 Minutes Intravenous Every 8 hours 11/26/18 2336     11/26/18 2330  vancomycin (VANCOCIN) 1,250 mg in sodium chloride 0.9 % 250 mL IVPB     1,250 mg 166.7 mL/hr over 90 Minutes Intravenous STAT 11/26/18 2324 11/27/18 0345   11/26/18 2230  piperacillin-tazobactam (ZOSYN) IVPB 3.375 g     3.375 g 100 mL/hr over 30 Minutes Intravenous  Once 11/26/18 2218 11/26/18 2302       Assessment/Plan V-tach with V fib, s/o ICD shock x4  Nonischemic cardiomyopathy with EF of 15% Atrial fibrillation  Peripheral vascular disease Hx of CVA Hx of colon cancer Dementia/FTT   Right buttock/perineal abscess -s/p I&D 11/28/18 Dr. Redmond Pulling - wound cleaning up - may benefit from hydrotherapy, will discuss with MD - continue BID dressing changes - cxs pending, continue BS abx  FEN - reg  diet  VTE - SCDs, heparin gtt ID - Vanc/zosyn 4/2>>  LOS: 4 days    Brigid Re , [facility] 11/30/2018, 8:24 AM Pager: [phone] Consults: [phone]

## 2018-11-30 NOTE — Plan of Care (Signed)
Attempted to call for video chat x 3, unsuccessful (pt's sister)

## 2018-11-30 NOTE — Progress Notes (Addendum)
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, Hx of Vfib arrest, LBBB  S/p St Jude biventricular ICD placement, atrial fibrillation, and memory deficit  History is limited from patient as she does not recall the episode and only answers with simple yes/no  She was at the surgeon's office for evaluation of a perirectal infection, CT noted showed multiple foci of gas She had a syncopal episode with icd shock at the surgical center, she is sent to the ER  Cardiology consulted, she is started on amiodarone drip She is found to have elevated INR, she received vit k and FFP on admission per general surgery recommendation   HPI/Recap of past 24 hours:   POD#2, had an uneventful night, she is sitting up in chair, on room air at rest, no fever  She is currently  on amiodarone drip, heparin drip, iv abx She is 6liter positive per I/o's, not sure is accurate, 1liter urine putput documented last 24hrs  RN reports patient has poor appetites , does not eat much at all,  No bm since hospitalized    Assessment/Plan: Principal Problem:   Syncope Active Problems:   Systolic CHF, chronic (HCC)   Ventricular fibrillation (HCC)   S/P Mechanical MVR (mitral valve replacement)   ICD (implantable cardioverter-defibrillator), biventricular, in situ   Elevated troponin   Ventricular tachycardia (HCC)   Elevated INR   Perineal abscess  Right perineal/perirectal abscess with presence of gas/Fournier's gangrene - Incision, drainage & Excisional DEBRIDEMENT with scalpel of right Perineal/buttock Fournier's gangrene; pulsatile lavage on 4/4 -general surgery oked to resume heparin drip  on 4/5  -blood cultures no growth, mrsa screening negative, wound culture preliminary  result with pansensitive ecoli,  -she is Started IV vancomycin and  Zosyn since admission, will follow general surgery recommendation regarding abx choice and duration -wound care per general surgery , may need hydrotherapy per general surgery  Addendum: general surgery oked to stop vanc.  syncope/Ventricular tachyarrhythmia with ventricular fibrillation s/p ICD shock x5: She is transferred to cardiac ICU last night due to recurrent vfib while on  IV amiodarone drip, she is started on lidocaine drip  currently off lidocaine drip, continue on amiodarone drip. Cardiology /EP consulted, will follow recommendations   Afib, SSS s/p PPM, s/p mechanical MVR on coumadin, with supratherapeutic INR on presentation INR reversed for surgery, patient is restarted back to heparin drip on 4/5 per general surgery recommendation  Resume coumadin when oks with general surgery   Nonischemic cardiomyopathy, EF 15% s/o biVICD -Recent hospitalization for heart failure from 3/2 to 3/4 -currently euvolemic to dry -close monitor volume status, cardiology consulted   Dementia/FTT -she had h/o colon cancer, she has h/o gi bleed, palliative care involved in 04/2017, family was not receptive to palliative care at the time per chart review  Addendum: patient reports feeling nauseous, no ab pain,  No bm for several days, will get KUB, milk of mag, prn zofran,   Code Status: full  Family Communication: patient   Disposition Plan: not ready to discharge   Consultants:  Cardiology/EP  General surgery  Procedures:  Incision, drainage & Excisional DEBRIDEMENT with scalpel of right Perineal/buttock Fournier's gangrene; pulsatile lavage on 4/4  Antibiotics:  Vanc/zosyn from admission   Objective: BP 112/74   Pulse 76   Temp 97.6 F (36.4 C) (Oral)   Resp 20  Ht 5\' 6"  (1.676 m)   Wt 67.9 kg   SpO2 98%   BMI 24.16 kg/m   Intake/Output Summary (Last 24 hours) at 11/30/2018 1117 Last data filed at 11/30/2018 1100 Gross per 24 hour  Intake 1333.73 ml  Output 1130  ml  Net 203.73 ml   Filed Weights   11/28/18 0413 11/29/18 0325 11/30/18 0411  Weight: 63.9 kg 68.6 kg 67.9 kg    Exam: Patient is examined daily including today on 11/30/2018, exams remain the same as of yesterday except that has changed    General:  Chronically ill, weak, but fully awake, answers simple questions  Cardiovascular: paced rhythm  Respiratory: diminished, no wheezing, no rales, no rhonchi  Abdomen: post op changes (perioneal/buttock) , positive BS  Musculoskeletal: No Edema  Neuro: awake, answers simple questions   Data Reviewed: Basic Metabolic Panel: Recent Labs  Lab 11/26/18 1614 11/27/18 1440 11/27/18 2123 11/28/18 0152 11/29/18 0328 11/30/18 0113  NA 134* 138 134* 135 132* 130*  K 4.1 3.0* 3.2* 3.5 4.0 4.3  CL 99 106 100 104 102 102  CO2 20* 23 22 22 22  20*  GLUCOSE 181* 93 115* 102* 112* 124*  BUN 17 8 9 9 8 9   CREATININE 0.97 0.70 0.76 0.72 0.82 0.96  CALCIUM 8.8* 7.8* 8.0* 8.1* 8.0* 8.1*  MG 1.8 1.9 2.1  --  1.9  --    Liver Function Tests: Recent Labs  Lab 11/26/18 1614  AST 41  ALT 44  ALKPHOS 76  BILITOT 1.9*  PROT 7.0  ALBUMIN 2.7*   No results for input(s): LIPASE, AMYLASE in the last 168 hours. No results for input(s): AMMONIA in the last 168 hours. CBC: Recent Labs  Lab 11/26/18 1614 11/27/18 1440 11/28/18 0152 11/29/18 0328 11/30/18 0113  WBC 20.2* 18.4* 16.8* 15.2* 13.9*  NEUTROABS 18.5* 16.9*  --   --   --   HGB 15.3* 11.5* 11.7* 11.2* 12.0  HCT 47.5* 35.6* 36.1 33.8* 36.1  MCV 88.3 88.1 88.3 88.3 87.6  PLT 299 240 225 255 274   Cardiac Enzymes:   Recent Labs  Lab 11/26/18 1614 11/26/18 2349 11/27/18 1440  TROPONINI 0.16* 0.40* 0.30*   BNP (last 3 results) Recent Labs    10/26/18 1855  BNP 1,092.7*    ProBNP (last 3 results) No results for input(s): PROBNP in the last 8760 hours.  CBG: Recent Labs  Lab 11/29/18 1133 11/29/18 1603 11/29/18 2030 11/30/18 0421 11/30/18 0850  GLUCAP 103* 130*  111* 119* 109*    Recent Results (from the past 240 hour(s))  Culture, blood (Routine X 2) w Reflex to ID Panel     Status: None (Preliminary result)   Collection Time: 11/26/18 11:45 PM  Result Value Ref Range Status   Specimen Description BLOOD RIGHT HAND  Final   Special Requests   Final    BOTTLES DRAWN AEROBIC ONLY Blood Culture adequate volume   Culture   Final    NO GROWTH 3 DAYS Performed at [facility], [place] [address], [place], [place] [address]    Report Status PENDING  Incomplete  Culture, blood (Routine X 2) w Reflex to ID Panel     Status: None (Preliminary result)   Collection Time: 11/26/18 11:59 PM  Result Value Ref Range Status   Specimen Description BLOOD LEFT HAND  Final   Special Requests   Final    BOTTLES DRAWN AEROBIC ONLY Blood Culture adequate volume   Culture  Final    NO GROWTH 3 DAYS Performed at [facility], [place] [address] [place], [place] [address]    Report Status PENDING  Incomplete  MRSA PCR Screening     Status: None   Collection Time: 11/27/18 12:41 AM  Result Value Ref Range Status   MRSA by PCR NEGATIVE NEGATIVE Final    Comment:        The GeneXpert MRSA Assay (FDA approved for NASAL specimens only), is one component of a comprehensive MRSA colonization surveillance program. It is not intended to diagnose MRSA infection nor to guide or monitor treatment for MRSA infections. Performed at [facility], [place] [address] [place], [place] [address]   Aerobic/Anaerobic Culture (surgical/deep wound)     Status: None (Preliminary result)   Collection Time: 11/28/18 10:32 AM  Result Value Ref Range Status   Specimen Description ABSCESS BUTTOCKS  Final   Special Requests PATIENT ON FOLLOWING VANC AND ZOSYN  Final   Gram Stain   Final    NO WBC SEEN RARE GRAM POSITIVE COCCI IN PAIRS RARE GRAM POSITIVE RODS Performed at [facility], [address] [place], [place] [address]    Culture   Final    RARE  ESCHERICHIA COLI CULTURE REINCUBATED FOR BETTER GROWTH MIXED ANAEROBIC FLORA PRESENT.  CALL LAB IF FURTHER IID REQUIRED.    Report Status PENDING  Incomplete   Organism ID, Bacteria ESCHERICHIA COLI  Final      Susceptibility   Escherichia coli - MIC*    AMPICILLIN <=2 SENSITIVE Sensitive     CEFAZOLIN <=4 SENSITIVE Sensitive     CEFEPIME <=1 SENSITIVE Sensitive     CEFTAZIDIME <=1 SENSITIVE Sensitive     CEFTRIAXONE <=1 SENSITIVE Sensitive     CIPROFLOXACIN <=0.25 SENSITIVE Sensitive     GENTAMICIN <=1 SENSITIVE Sensitive     IMIPENEM <=0.25 SENSITIVE Sensitive     TRIMETH/SULFA <=20 SENSITIVE Sensitive     AMPICILLIN/SULBACTAM <=2 SENSITIVE Sensitive     PIP/TAZO <=4 SENSITIVE Sensitive     Extended ESBL NEGATIVE Sensitive     * RARE ESCHERICHIA COLI     Studies: No results found.  Scheduled Meds: . Chlorhexidine Gluconate Cloth  6 each Topical Q0600  . insulin aspart  0-9 Units Subcutaneous Q4H  . mouth rinse  15 mL Mouth Rinse BID  . polyethylene glycol  17 g Oral Daily  . potassium chloride  40 mEq Oral Daily  . senna-docusate  1 tablet Oral BID  . sodium chloride flush  3 mL Intravenous Q12H    Continuous Infusions: . amiodarone 60 mg/hr (11/30/18 1100)  . heparin 1,100 Units/hr (11/30/18 1100)  . piperacillin-tazobactam (ZOSYN)  IV Stopped (11/30/18 1002)  . vancomycin 1,000 mg (11/29/18 2242)     Time spent: 98mins, case discussed with general surgery I have personally reviewed and interpreted on  11/30/2018 daily labs, tele strips, imagings as discussed above under date review session and assessment and plans.  I reviewed all nursing notes, pharmacy notes, consultant notes,  vitals, pertinent old records  I have discussed plan of care as described above with RN , patient on 11/30/2018   Florencia Reasons MD, PhD  [facility] Pager [phone]. If 7PM-7AM, please contact night-coverage at [email or web address], password [facility] 11/30/2018, 11:17 AM  LOS: 4 days

## 2018-11-30 NOTE — Progress Notes (Signed)
ANTICOAGULATION CONSULT NOTE  Pharmacy Consult for heparin Indication: AFib, mechanical MVR, hx DVT  No Known Allergies  Patient Measurements: Height: 5\' 6"  (167.6 cm) Weight: 149 lb 11.1 oz (67.9 kg) IBW/kg (Calculated) : 59.3 Heparin Dosing Weight: 60kg  Vital Signs: Temp: 97.8 F (36.6 C) (04/06 1154) Temp Source: Oral (04/06 1154) BP: 146/80 (04/06 1300) Pulse Rate: 70 (04/06 1300)  Labs: Recent Labs    11/27/18 1440  11/28/18 0152 11/29/18 0328 11/29/18 2013 11/30/18 0113 11/30/18 0444 11/30/18 1221  HGB 11.5*  --  11.7* 11.2*  --  12.0  --   --   HCT 35.6*  --  36.1 33.8*  --  36.1  --   --   PLT 240  --  225 255  --  274  --   --   LABPROT 16.8*  --  17.0* 19.6*  --  19.7*  --   --   INR 1.4*  --  1.4* 1.7*  --  1.7*  --   --   HEPARINUNFRC  --   --   --   --  0.18*  --  0.26* 0.45  CREATININE 0.70   < > 0.72 0.82  --  0.96  --   --   TROPONINI 0.30*  --   --   --   --   --   --   --    < > = values in this interval not displayed.    Estimated Creatinine Clearance: 48.1 mL/min (by C-G formula based on SCr of 0.96 mg/dL).   Assessment: 53 yoF on warfarin PTA for PAF, hx DVT, and mechanical MVR. Pt admitted with supratherapeutic INR of 9.1 s/p FFP and vitamin K due to urgent need for I&D on 4/4. Surgery okayed resuming heparin infusion - will avoid boluses and target low goal given recent surgery.  Heparin level came back therapeutic at 0.45, on 1100 units/hr. Hgb 12, plt 274. No s/sx of bleeding. No infusion issues.   Goal of Therapy:  Heparin level 0.3-0.5 units/ml Monitor platelets by anticoagulation protocol: Yes   Plan:  -Continue heparin at 1100 units/hr -Daily CBC, Heparin Level  Thanks for allowing pharmacy to be a part of this patient's care.  Antonietta Jewel, PharmD, [place] Clinical Pharmacist  Pager: [phone] Phone: [phone]

## 2018-11-30 NOTE — Plan of Care (Signed)
Elink:  Video chat with husband and son

## 2018-11-30 NOTE — Plan of Care (Signed)
Elink:  Video chat with pt's sister unsuccessful, pt's sister states she is out and about and will do later

## 2018-11-30 NOTE — Progress Notes (Signed)
Progress Note  Patient Name: Angela [name] Date of Encounter: 11/30/2018  Primary Cardiologist: Cristopher Peru, MD   Subjective   C/o some pain in her groin/buttocks  Inpatient Medications    Scheduled Meds: . Chlorhexidine Gluconate Cloth  6 each Topical Q0600  . insulin aspart  0-9 Units Subcutaneous Q4H  . mouth rinse  15 mL Mouth Rinse BID  . polyethylene glycol  17 g Oral Daily  . potassium chloride  40 mEq Oral Daily  . senna-docusate  1 tablet Oral BID  . sodium chloride flush  3 mL Intravenous Q12H   Continuous Infusions: . amiodarone 60 mg/hr (11/30/18 0700)  . heparin 1,100 Units/hr (11/30/18 0700)  . piperacillin-tazobactam (ZOSYN)  IV 12.5 mL/hr at 11/30/18 0700  . vancomycin 1,000 mg (11/29/18 2242)   PRN Meds: acetaminophen **OR** acetaminophen, lip balm, morphine injection, oxyCODONE   Vital Signs    Vitals:   11/30/18 0600 11/30/18 0700 11/30/18 0800 11/30/18 0900  BP: 128/82 129/81 102/82 114/71  Pulse: 71 76 76 74  Resp: (!) 24 10 18  (!) 8  Temp:      TempSrc:      SpO2: 92% 95% 96% 91%  Weight:      Height:        Intake/Output Summary (Last 24 hours) at 11/30/2018 0923 Last data filed at 11/30/2018 0700 Gross per 24 hour  Intake 1198.89 ml  Output 1130 ml  Net 68.89 ml   Filed Weights   11/28/18 0413 11/29/18 0325 11/30/18 0411  Weight: 63.9 kg 68.6 kg 67.9 kg    Telemetry    Atrial fib with biv pacing  - Personally Reviewed  ECG    No new EKGs - Personally Reviewed  Physical Exam     GEN: No acute distress.   Neck: No JVD Cardiac:  RRR,  Mechanical S1 appreciated, soft systolic murmur, rubs, or gallops.  Respiratory: no increased work of breathing, CTA b/l GI: Soft, nontender, non-distended  GU - deferred MS:  No edema; No deformity. Neuro:  Nonfocal  Psych: blunted affect   Labs    Chemistry Recent Labs  Lab 11/26/18 1614  11/28/18 0152 11/29/18 0328 11/30/18 0113  NA 134*   < > 135 132* 130*  K 4.1   < > 3.5  4.0 4.3  CL 99   < > 104 102 102  CO2 20*   < > 22 22 20*  GLUCOSE 181*   < > 102* 112* 124*  BUN 17   < > 9 8 9   CREATININE 0.97   < > 0.72 0.82 0.96  CALCIUM 8.8*   < > 8.1* 8.0* 8.1*  PROT 7.0  --   --   --   --   ALBUMIN 2.7*  --   --   --   --   AST 41  --   --   --   --   ALT 44  --   --   --   --   ALKPHOS 76  --   --   --   --   BILITOT 1.9*  --   --   --   --   GFRNONAA 58*   < > >60 >60 58*  GFRAA >60   < > >60 >60 >60  ANIONGAP 15   < > 9 8 8    < > = values in this interval not displayed.     Hematology Recent Labs  Lab 11/28/18 [phone] 11/29/18 9604  11/30/18 0113  WBC 16.8* 15.2* 13.9*  RBC 4.09 3.83* 4.12  HGB 11.7* 11.2* 12.0  HCT 36.1 33.8* 36.1  MCV 88.3 88.3 87.6  MCH 28.6 29.2 29.1  MCHC 32.4 33.1 33.2  RDW 14.9 15.2 15.3  PLT 225 255 274    Cardiac Enzymes Recent Labs  Lab 11/26/18 1614 11/26/18 2349 11/27/18 1440  TROPONINI 0.16* 0.40* 0.30*   No results for input(s): TROPIPOC in the last 168 hours.   BNPNo results for input(s): BNP, PROBNP in the last 168 hours.   DDimer No results for input(s): DDIMER in the last 168 hours.   Radiology    No results found.  Cardiac Studies   10/27/2018: TTE IMPRESSIONS 1. The left ventricle has a visually estimated ejection fraction of of 15-20%. The cavity size was severely dilated. Left ventricular diastolic Doppler parameters are indeterminate due to mitral valve replacement/repair. Left ventricular diffuse  hypokinesis. 2. The right ventricle has moderately reduced systolic function. The cavity was normal. There is no increase in right ventricular wall thickness. 3. Left atrial size was severely dilated. 4. Right atrial size was severely dilated. 5. A mechanical valve is present in the mitral position. Procedure Date: 1983. 6. Bjork-Shiley mechanical mitral valve prosthesis functioning properly. 7. The tricuspid valve is normal in structure. 8. The aortic valve is normal in structure. 9.  The pulmonic valve was normal in structure. Pulmonic valve regurgitation is mild by color flow Doppler. 10. The inferior vena cava was normal in size with <50% respiratory variability.  FINDINGS Left Ventricle: The left ventricle has a visually estimated ejection fraction of of 15-20%. The cavity size was severely dilated. There is no increase in left ventricular wall thickness. Left ventricular diastolic Doppler parameters are indeterminate  due to mitral valve replacement/repair. Left ventricular diffuse hypokinesis. Right Ventricle: The right ventricle has moderately reduced systolic function. The cavity was normal. There is no increase in right ventricular wall thickness. Pacing wire/catheter visualized in the right ventricle. Left Atrium: left atrial size was severely dilated Right Atrium: right atrial size was severely dilated. Right atrial pressure is estimated at 8 mmHg. Interatrial Septum: No atrial level shunt detected by color flow Doppler. Pericardium: There is no evidence of pericardial effusion. Mitral Valve: The mitral valve has been repaired/replaced. Mitral valve regurgitation is mild by color flow Doppler. A mechanical valve is present in the mitral position. Procedure Date: 21. Bjork-Shiley mechanical mitral valve prosthesis functioning  properly. Tricuspid Valve: The tricuspid valve is normal in structure. Tricuspid valve regurgitation is mild by color flow Doppler. Aortic Valve: The aortic valve is normal in structure. Aortic valve regurgitation was not visualized by color flow Doppler. Pulmonic Valve: The pulmonic valve was normal in structure. Pulmonic valve regurgitation is mild by color flow Doppler. Venous: The inferior vena cava is normal in size with less than 50% respiratory variability.    Patient Profile     73 y.o. female with hx of NICM, LBBB, chronic CHF (systolic), VF arrest AND CHB s/p AVNode ablation w/ICD, stroke, h/o DVT as well as embolic event to  iliac artert (s/p embolectomy), permanent Afib (has had av node ablation), rheumatic VHD s/p mechanical MVR admitted with VT and syncope in the setting of GU/rectal abscess/gangrene  Device information: SJM CRT- ICD, implanted 2009, gen change 09/05/14 Patient has an abandoned pacing system on the right side by CXR Device interrogation (MOD) battery and lead measurements are good (no LV threshold completed) Base programming VVIR 70bpm (Patient has record of AVNode  ablation though has R waves 60's) The patient has had 8 NSVT events since march 13 to yesterday, 2 are PMVT Yesterday 4 true VF events, all successfully terminated with one shock each   Assessment & Plan    1. VT     None on telemetry       amio remains at 60mg /hr. We will reduce the dose today     Lidocaine gtt stopped yesterday     2. Chronic systolic heart failure, h/o NICM     LVEF 15-20%      she appears to be euvolemic.      Monitor closely for volume OL      Continue her current meds.     cumulatively she is fluid + (+6216ml)   3. Atrial fib - her VR is well controlled. Continue her current meds.  4. Mechanical MVR         anticoagulated with heparin gtt post-op to resume warfarin when cleared to by surgical service As she is going to be several days before she will become therapeutic, I would restart coumadin even if you plan to re-explore tomorrow.   4. Infection -      11/28/2018: S/p Incision, drainage & Excisional DEBRIDEMENT with scalpel of right Perineal/buttock Fournier's gangrene; pulsatile lavage     Continue with medicine/surgical services  For questions or updates, please contact [place] Please consult [email or web address] for contact info under Cardiology/STEMI.   Signed, Baldwin Jamaica, PA-C  11/30/2018, 9:23 AM  Patient ID: [name], female   DOB: [birth year], 73 y.o.   MRN: [id number]   EP Attending  Patient seen and examined. I have modified the above note minimally to reflect my  thoughts. We will reduce her dose of IV amiodarone. Consider restarting warfarin. It will be 3-4 days before becoming therapeutic.  Mikle Bosworth.D.

## 2018-11-30 NOTE — Progress Notes (Signed)
Physical Therapy Treatment Patient Details Name: Angela [name] MRN: [id number] DOB: [birth year] Today's Date: 11/30/2018    History of Present Illness 73 y.o. 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   PT Comments    Orders received for PT evaluation. Patient demonstrates deficits in functional mobility as indicated below. Will benefit from continued skilled PT to address deficits and maximize function. Will see as indicated and progress as tolerated. Patient very limited by pain and anxiety, recommend SNF upon acute discharge.    Follow Up Recommendations  SNF;Supervision/Assistance - 24 hour     Equipment Recommendations  Other (comment)(tbd)    Recommendations for Other Services       Precautions / Restrictions Precautions Precautions: Fall Restrictions Weight Bearing Restrictions: No    Mobility  Bed Mobility Overal bed mobility: Needs Assistance Bed Mobility: Rolling;Supine to Sit Rolling: Mod assist   Supine to sit: Mod assist     General bed mobility comments: Moderate assist to bring LEs to EOB and elevate trunk to upright  Transfers Overall transfer level: Needs assistance Equipment used: (face to face transfer) Transfers: Sit to/from Stand Sit to Stand: Max assist;+2 physical assistance         General transfer comment: patient with significant anxiety and fear causing patient to go into full body extension  Ambulation/Gait             General Gait Details: unable to perfrom   Stairs             Wheelchair Mobility    Modified Rankin (Stroke Patients Only)       Balance Overall balance assessment: Needs  assistance Sitting-balance support: Feet supported Sitting balance-Leahy Scale: Poor Sitting balance - Comments: patient with significant lateral lean to avoid pressure on buttocks causing patient to require hands on physical assist     Standing balance-Leahy Scale: Zero                              Cognition Arousal/Alertness: Awake/alert Behavior During Therapy: Anxious Overall Cognitive Status: No family/caregiver present to determine baseline cognitive functioning                                        Exercises      General Comments General comments (skin integrity, edema, etc.): buttock wound s/p I&D      Pertinent Vitals/Pain Pain Assessment: Faces Faces Pain Scale: Hurts whole lot Pain Location: hip/buttocks Pain Descriptors / Indicators: Grimacing;Guarding Pain Intervention(s): Limited activity within patient's tolerance;Monitored during session;Premedicated before session;Repositioned    Home Living Family/patient expects to be discharged to:: Private residence Living Arrangements: Children;Spouse/significant other Available Help at Discharge: Family;Available 24 hours/day Type of Home: House Home Access: Stairs to enter Entrance Stairs-Rails: Right Home Layout: One level Home Equipment: Walker - 2 wheels;Cane - quad Additional Comments: information obtained from chart review of previous admission    Prior Function Level of Independence: Independent with assistive device(s)      Comments: was using quad cane per chart   PT Goals (current goals  can now be found in the care plan section) Acute Rehab PT Goals Patient Stated Goal: none stated PT Goal Formulation: Patient unable to participate in goal setting Time For Goal Achievement: 12/14/18 Potential to Achieve Goals: Fair    Frequency    Min 2X/week      PT Plan      Co-evaluation              AM-PAC PT "6 Clicks" Mobility   Outcome Measure  Help needed  turning from your back to your side while in a flat bed without using bedrails?: A Lot Help needed moving from lying on your back to sitting on the side of a flat bed without using bedrails?: A Lot Help needed moving to and from a bed to a chair (including a wheelchair)?: A Lot Help needed standing up from a chair using your arms (e.g., wheelchair or bedside chair)?: A Lot Help needed to walk in hospital room?: Total Help needed climbing 3-5 steps with a railing? : Total 6 Click Score: 10    End of Session Equipment Utilized During Treatment: Gait belt Activity Tolerance: Patient limited by pain Patient left: in CPM;with call bell/phone within reach;with chair alarm set Nurse Communication: Mobility status PT Visit Diagnosis: Difficulty in walking, not elsewhere classified (R26.2);Pain     Time: 3329-5188 PT Time Calculation (min) (ACUTE ONLY): 22 min  Charges:    1 Visit 1 PT eval                     Alben Deeds, PT DPT  Board Certified Neurologic Specialist Acute Rehabilitation Services Pager [phone] Office [place] 11/30/2018, 1:40 PM

## 2018-11-30 NOTE — Progress Notes (Signed)
Initial Nutrition Assessment  RD working remotely.  DOCUMENTATION CODES:   Not applicable, suspect some degree of malnutrition but unable to confirm at this time without NFPE and diet history  INTERVENTION:   - Agree with Regular diet order  - Pro-stat 30 ml po BID, each supplement provides 100 kcal and 15 grams of protein  - Ensure Enlive po TID, each supplement provides 350 kcal and 20 grams of protein  - MVI with minerals daily  - Encourage adequate PO intake and provide feeding assistance as needed  NUTRITION DIAGNOSIS:   Inadequate oral intake related to lethargy/confusion, acute illness as evidenced by meal completion < 25%.  GOAL:   Patient will meet greater than or equal to 90% of their needs  MONITOR:   PO intake, Supplement acceptance, Weight trends, Skin, Labs  REASON FOR ASSESSMENT:   Consult Assessment of nutrition requirement/status  ASSESSMENT:   73 year old female who presented to the ED on 4/2 from surgical center after having a syncopal episode in the lobby associated with ICD shocks. 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 Surgery consulted for perineal abscess.  4/04 - s/p I&D of right perineal/buttock Fournier's gangrene  Noted therapies recommending SNF. Per review of notes, pt was essentially bedbound PTA.  Weight appears fairly stable over the last 2 years with fluctuations between 61-67 kg. Weight on admission charted as 60.7 kg and today's weight charted as 67.9 kg. Unsure of pt's true dry weight which could indicate pt has been losing weight over time.  Attempted to speak with pt by calling in to pt's room phone but call was not answered.  Based on poor meal completion, RD to order oral nutrition supplements to aid in wound healing and in meeting kcal and protein needs.  Suspect some degree of malnutrition but unable to confirm at this time without  diet history and NFPE.  Meal Completion: 0-10%  Medications reviewed and include: SSI q 4 hours, Miralax, K-dur 40 mEq daily, Senna, amiodarone, heparin, IV antibiotics  Labs reviewed: sodium 130 (L) CBG's: 120, 109, 119, 111, 130 x 24 hours  UOP: 1130 ml x 24 hours I/O's: +6.6 L since admit  NUTRITION - FOCUSED PHYSICAL EXAM:  Unable to complete at this time. RD working remotely.  Diet Order:   Diet Order            Diet regular Room service appropriate? Yes; Fluid consistency: Thin  Diet effective now              EDUCATION NEEDS:   Not appropriate for education at this time  Skin:  Skin Assessment: Skin Integrity Issues: Other: Fournier's gangrene to right perineal/buttocks s/p I&D  Last BM:  11/26/18  Height:   Ht Readings from Last 1 Encounters:  11/26/18 5\' 6"  (1.676 m)    Weight:   Wt Readings from Last 1 Encounters:  11/30/18 67.9 kg    Ideal Body Weight:  59.1 kg  BMI:  Body mass index is 24.16 kg/m.  Estimated Nutritional Needs:   Kcal:  1500-1700  Protein:  85-100 grams  Fluid:  1.5-1.7 L    Gaynell Face, MS, RD, LDN Inpatient Clinical Dietitian Pager: [phone] Weekend/After Hours: [phone]

## 2018-12-01 DIAGNOSIS — F0150 Vascular dementia without behavioral disturbance: Secondary | ICD-10-CM

## 2018-12-01 LAB — CBC WITH DIFFERENTIAL/PLATELET
Abs Immature Granulocytes: 0.13 10*3/uL — ABNORMAL HIGH (ref 0.00–0.07)
Basophils Absolute: 0 10*3/uL (ref 0.0–0.1)
Basophils Relative: 0 %
Eosinophils Absolute: 0 10*3/uL (ref 0.0–0.5)
Eosinophils Relative: 0 %
HCT: 38.6 % (ref 36.0–46.0)
Hemoglobin: 12.8 g/dL (ref 12.0–15.0)
Immature Granulocytes: 1 %
Lymphocytes Relative: 7 %
Lymphs Abs: 0.8 10*3/uL (ref 0.7–4.0)
MCH: 29.3 pg (ref 26.0–34.0)
MCHC: 33.2 g/dL (ref 30.0–36.0)
MCV: 88.3 fL (ref 80.0–100.0)
Monocytes Absolute: 0.5 10*3/uL (ref 0.1–1.0)
Monocytes Relative: 5 %
Neutro Abs: 9.2 10*3/uL — ABNORMAL HIGH (ref 1.7–7.7)
Neutrophils Relative %: 87 %
Platelets: 278 10*3/uL (ref 150–400)
RBC: 4.37 MIL/uL (ref 3.87–5.11)
RDW: 15.3 % (ref 11.5–15.5)
WBC: 10.7 10*3/uL — ABNORMAL HIGH (ref 4.0–10.5)
nRBC: 0 % (ref 0.0–0.2)

## 2018-12-01 LAB — GLUCOSE, CAPILLARY
Glucose-Capillary: 110 mg/dL — ABNORMAL HIGH (ref 70–99)
Glucose-Capillary: 94 mg/dL (ref 70–99)
Glucose-Capillary: 90 mg/dL (ref 70–99)
Glucose-Capillary: 83 mg/dL (ref 70–99)
Glucose-Capillary: 75 mg/dL (ref 70–99)
Glucose-Capillary: 102 mg/dL — ABNORMAL HIGH (ref 70–99)
Glucose-Capillary: 91 mg/dL (ref 70–99)

## 2018-12-01 LAB — BASIC METABOLIC PANEL
Anion gap: 11 (ref 5–15)
BUN: 10 mg/dL (ref 8–23)
CO2: 22 mmol/L (ref 22–32)
Calcium: 8.8 mg/dL — ABNORMAL LOW (ref 8.9–10.3)
Chloride: 102 mmol/L (ref 98–111)
Creatinine, Ser: 1.01 mg/dL — ABNORMAL HIGH (ref 0.44–1.00)
GFR calc Af Amer: >60 mL/min (ref >60)
GFR calc non Af Amer: 55 mL/min — ABNORMAL LOW (ref >60)
Glucose, Bld: 97 mg/dL (ref 70–99)
Potassium: 4.7 mmol/L (ref 3.5–5.1)
Sodium: 135 mmol/L (ref 135–145)

## 2018-12-01 LAB — PROTIME-INR
INR: 2 — ABNORMAL HIGH (ref 0.8–1.2)
Prothrombin Time: 22.3 seconds — ABNORMAL HIGH (ref 11.4–15.2)

## 2018-12-01 LAB — HEPARIN LEVEL (UNFRACTIONATED): Heparin Unfractionated: 0.49 IU/mL (ref 0.30–0.70)

## 2018-12-01 LAB — MAGNESIUM: Magnesium: 1.9 mg/dL (ref 1.7–2.4)

## 2018-12-01 MED ORDER — WARFARIN - PHARMACIST DOSING INPATIENT
Freq: Every day | Status: DC
  Administered 2018-12-01 – 2018-12-08 (×4)

## 2018-12-01 MED ORDER — WARFARIN SODIUM 1 MG PO TABS
1.0000 mg | ORAL_TABLET | Freq: Once | ORAL | Status: AC
  Administered 2018-12-01: 1 mg via ORAL
  Filled 2018-12-01: qty 1

## 2018-12-01 MED ORDER — AMIODARONE HCL 200 MG PO TABS
400.0000 mg | ORAL_TABLET | Freq: Two times a day (BID) | ORAL | Status: AC
  Administered 2018-12-01 – 2018-12-07 (×14): 400 mg via ORAL
  Filled 2018-12-01 (×14): qty 2

## 2018-12-01 NOTE — Progress Notes (Addendum)
ANTICOAGULATION CONSULT NOTE  Pharmacy Consult for heparin Indication: AFib, mechanical MVR, hx DVT  No Known Allergies  Patient Measurements: Height: 5\' 6"  (167.6 cm) Weight: 143 lb 15.4 oz (65.3 kg) IBW/kg (Calculated) : 59.3 Heparin Dosing Weight: 60kg  Vital Signs: Temp: 98.1 F (36.7 C) (04/07 0758) Temp Source: Oral (04/07 0758) BP: 142/70 (04/07 0900) Pulse Rate: 73 (04/07 0803)  Labs: Recent Labs    11/29/18 0328  11/30/18 0113 11/30/18 0444 11/30/18 1221 12/01/18 0754  HGB 11.2*  --  12.0  --   --  12.8  HCT 33.8*  --  36.1  --   --  38.6  PLT 255  --  274  --   --  278  LABPROT 19.6*  --  19.7*  --   --  22.3*  INR 1.7*  --  1.7*  --   --  2.0*  HEPARINUNFRC  --    < >  --  0.26* 0.45 0.49  CREATININE 0.82  --  0.96  --   --  1.01*   < > = values in this interval not displayed.    Estimated Creatinine Clearance: 45.7 mL/min (A) (by C-G formula based on SCr of 1.01 mg/dL (H)).   Assessment: 45 yoF on warfarin PTA for PAF, hx DVT, and mechanical MVR. Pt admitted with supratherapeutic INR of 9.1 s/p FFP and vitamin K due to urgent need for I&D on 4/4. Surgery okayed resuming heparin infusion - will avoid boluses and target low goal given recent surgery.  Heparin level came back therapeutic at 0.49, on 1100 units/hr. Hgb 12.8, plt 278. No s/sx of bleeding. No infusion issues. INR 2 today - LD warfarin on 4/2.   PTA regimen is 6 mg daily.   Goal of Therapy:  INR 2.5-3.5 Heparin level 0.3-0.5 units/ml Monitor platelets by anticoagulation protocol: Yes   Plan:  -Order warfarin 1 mg tonight -Reduce heparin infusion to 1050 units/hr to keep within goal range -Daily CBC, Heparin Level  Thanks for allowing pharmacy to be a part of this patient's care.  Antonietta Jewel, PharmD, [place] Clinical Pharmacist  Pager: [phone] Phone: [phone]

## 2018-12-01 NOTE — Progress Notes (Signed)
PROGRESS NOTE  Angela [name] OBS:[id number] DOB: [birth year] DOA: 11/26/2018 PCP: Lorene Dy, MD  Brief summary:  medical history significant for HFrEF (EF 15-20%), NICM, s/p mechanical MVR on chronic Coumadin, Hx of Vfib arrest, LBBB  S/p [employer] biventricular ICD placement, atrial fibrillation, and memory deficit  History is limited from patient as she does not recall the episode and only answers with simple yes/no  She was at the surgeon's office for evaluation of a perirectal infection, CT noted showed multiple foci of gas She had a syncopal episode with icd shock at the surgical center, she is sent to the ER  Cardiology consulted, she is started on amiodarone drip She is found to have elevated INR, she received vit k and FFP on admission per general surgery recommendation   HPI/Recap of past 24 hours:   POD#3, had an uneventful night,  on room air at rest, no fever  She is off amiodarone drip, she remained on heparin drip,  She is 6liter positive per I/o's, not sure is accurate, 1liter urine putput documented last 24hrs, she does not appear volume overloaded  RN reports patient has poor appetites , does not eat much at all,  RN reports patient seems has trouble swallowing pills RN reports patient had  one bm today    Assessment/Plan: Principal Problem:   Syncope Active Problems:   Systolic CHF, chronic (HCC)   Ventricular fibrillation (HCC)   S/P Mechanical MVR (mitral valve replacement)   ICD (implantable cardioverter-defibrillator), biventricular, in situ   Elevated troponin   Ventricular tachycardia (HCC)   Elevated INR   Perineal abscess   Perirectal abscess  Right perineal/perirectal abscess with presence of gas/Fournier's gangrene - Incision, drainage & Excisional DEBRIDEMENT with scalpel of right Perineal/buttock Fournier's gangrene; pulsatile lavage on 4/4 -general surgery oked to resume heparin drip  on 4/5  -blood cultures no growth, mrsa screening  negative, wound culture preliminary  result with pansensitive ecoli, rare candida and mixed anaerobic flora -she is Started IV vancomycin and Zosyn since admission,  general surgery oked to stop vanc she remained on zosyn, .will follow general surgery recommendation regarding abx choice and duration -wound care per general surgery , may need hydrotherapy per general surgery    syncope/Ventricular tachyarrhythmia with ventricular fibrillation s/p ICD shock x5: She is transferred to cardiac ICU due to recurrent vfib while on  IV amiodarone drip, she is started on lidocaine drip  currently off lidocaine drip and amiodarone drip. She is started on oral amiodarone Cardiology /EP consulted, will follow recommendations   Afib, SSS s/p PPM, s/p mechanical MVR on coumadin, with supratherapeutic INR on presentation INR reversed for surgery, patient is restarted back to heparin drip on 4/5 per general surgery recommendation  General surgery oked to Resume coumadin,  Coumadin resumed on 4/7, continue heparin drip until INR at goal   Nonischemic cardiomyopathy, EF 15% s/o biVICD -Recent hospitalization for heart failure from 3/2 to 3/4 -currently euvolemic to dry -close monitor volume status, cardiology consulted   Dementia/FTT -she had h/o colon cancer, she has h/o gi bleed, palliative care involved in 04/2017, family was not receptive to palliative care at the time per chart review  patient reports feeling nauseous, no ab pain on 4/6, No bm for several days, KUB obtained on 4/6 unremarkable, she is started on stool regimen. prn zofran.   Code Status: full  Family Communication: patient   Disposition Plan: keep in cardiac ICU for another 24hrs, if stable can transfer to med  tele, awaiting INR to be therapeutic, may be able to discharge at the end of the week if INR at goal and cleared by cardiology and general surgery   Consultants:  Cardiology/EP  General surgery  Procedures:  Incision,  drainage & Excisional DEBRIDEMENT with scalpel of right Perineal/buttock Fournier's gangrene; pulsatile lavage on 4/4  Antibiotics:  Vanc from admission to 4/6  zosyn from admission   Objective: BP (!) 156/80   Pulse 71   Temp 98.1 F (36.7 C) (Oral)   Resp 19   Ht 5\' 6"  (1.676 m)   Wt 65.3 kg   SpO2 99%   BMI 23.24 kg/m   Intake/Output Summary (Last 24 hours) at 12/01/2018 1043 Last data filed at 12/01/2018 0900 Gross per 24 hour  Intake 1026.6 ml  Output 875 ml  Net 151.6 ml   Filed Weights   11/29/18 0325 11/30/18 0411 12/01/18 0500  Weight: 68.6 kg 67.9 kg 65.3 kg    Exam: Patient is examined daily including today on 12/01/2018, exams remain the same as of yesterday except that has changed    General:  Chronically ill, weak, but fully awake, answers simple questions  Cardiovascular: paced rhythm  Respiratory: diminished, no wheezing, no rales, no rhonchi  Abdomen: post op changes (perioneal/buttock) , positive BS  Musculoskeletal: No Edema  Neuro: awake, answers simple questions   Data Reviewed: Basic Metabolic Panel: Recent Labs  Lab 11/26/18 1614 11/27/18 1440 11/27/18 2123 11/28/18 0152 11/29/18 0328 11/30/18 0113 12/01/18 0754  NA 134* 138 134* 135 132* 130* 135  K 4.1 3.0* 3.2* 3.5 4.0 4.3 4.7  CL 99 106 100 104 102 102 102  CO2 20* 23 22 22 22  20* 22  GLUCOSE 181* 93 115* 102* 112* 124* 97  BUN 17 8 9 9 8 9 10   CREATININE 0.97 0.70 0.76 0.72 0.82 0.96 1.01*  CALCIUM 8.8* 7.8* 8.0* 8.1* 8.0* 8.1* 8.8*  MG 1.8 1.9 2.1  --  1.9  --  1.9   Liver Function Tests: Recent Labs  Lab 11/26/18 1614  AST 41  ALT 44  ALKPHOS 76  BILITOT 1.9*  PROT 7.0  ALBUMIN 2.7*   No results for input(s): LIPASE, AMYLASE in the last 168 hours. No results for input(s): AMMONIA in the last 168 hours. CBC: Recent Labs  Lab 11/26/18 1614 11/27/18 1440 11/28/18 0152 11/29/18 0328 11/30/18 0113 12/01/18 0754  WBC 20.2* 18.4* 16.8* 15.2* 13.9* 10.7*   NEUTROABS 18.5* 16.9*  --   --   --  9.2*  HGB 15.3* 11.5* 11.7* 11.2* 12.0 12.8  HCT 47.5* 35.6* 36.1 33.8* 36.1 38.6  MCV 88.3 88.1 88.3 88.3 87.6 88.3  PLT 299 240 225 255 274 278   Cardiac Enzymes:   Recent Labs  Lab 11/26/18 1614 11/26/18 2349 11/27/18 1440  TROPONINI 0.16* 0.40* 0.30*   BNP (last 3 results) Recent Labs    10/26/18 1855  BNP 1,092.7*    ProBNP (last 3 results) No results for input(s): PROBNP in the last 8760 hours.  CBG: Recent Labs  Lab 11/30/18 1504 11/30/18 2007 11/30/18 2329 12/01/18 0355 12/01/18 0756  GLUCAP 127* 125* 97 94 90    Recent Results (from the past 240 hour(s))  Culture, blood (Routine X 2) w Reflex to ID Panel     Status: None (Preliminary result)   Collection Time: 11/26/18 11:45 PM  Result Value Ref Range Status   Specimen Description BLOOD RIGHT HAND  Final   Special Requests  Final    BOTTLES DRAWN AEROBIC ONLY Blood Culture adequate volume   Culture   Final    NO GROWTH 4 DAYS Performed at [facility], [place] [address] [place], [place] [address]    Report Status PENDING  Incomplete  Culture, blood (Routine X 2) w Reflex to ID Panel     Status: None (Preliminary result)   Collection Time: 11/26/18 11:59 PM  Result Value Ref Range Status   Specimen Description BLOOD LEFT HAND  Final   Special Requests   Final    BOTTLES DRAWN AEROBIC ONLY Blood Culture adequate volume   Culture   Final    NO GROWTH 4 DAYS Performed at [facility], [place] [address] [place], [place] [address]    Report Status PENDING  Incomplete  MRSA PCR Screening     Status: None   Collection Time: 11/27/18 12:41 AM  Result Value Ref Range Status   MRSA by PCR NEGATIVE NEGATIVE Final    Comment:        The GeneXpert MRSA Assay (FDA approved for NASAL specimens only), is one component of a comprehensive MRSA colonization surveillance program. It is not intended to diagnose MRSA infection nor to guide or monitor treatment for  MRSA infections. Performed at [facility], [place] [address], [place], [place] [address]   Aerobic/Anaerobic Culture (surgical/deep wound)     Status: None (Preliminary result)   Collection Time: 11/28/18 10:32 AM  Result Value Ref Range Status   Specimen Description ABSCESS BUTTOCKS  Final   Special Requests PATIENT ON FOLLOWING VANC AND ZOSYN  Final   Gram Stain   Final    NO WBC SEEN RARE GRAM POSITIVE COCCI IN PAIRS RARE GRAM POSITIVE RODS Performed at [facility], [address] [place], [place] [address]    Culture   Final    RARE ESCHERICHIA COLI RARE CANDIDA ALBICANS MIXED ANAEROBIC FLORA PRESENT.  CALL LAB IF FURTHER IID REQUIRED.    Report Status PENDING  Incomplete   Organism ID, Bacteria ESCHERICHIA COLI  Final      Susceptibility   Escherichia coli - MIC*    AMPICILLIN <=2 SENSITIVE Sensitive     CEFAZOLIN <=4 SENSITIVE Sensitive     CEFEPIME <=1 SENSITIVE Sensitive     CEFTAZIDIME <=1 SENSITIVE Sensitive     CEFTRIAXONE <=1 SENSITIVE Sensitive     CIPROFLOXACIN <=0.25 SENSITIVE Sensitive     GENTAMICIN <=1 SENSITIVE Sensitive     IMIPENEM <=0.25 SENSITIVE Sensitive     TRIMETH/SULFA <=20 SENSITIVE Sensitive     AMPICILLIN/SULBACTAM <=2 SENSITIVE Sensitive     PIP/TAZO <=4 SENSITIVE Sensitive     Extended ESBL NEGATIVE Sensitive     * RARE ESCHERICHIA COLI     Studies: Dg Abd 1 View  Result Date: 11/30/2018 CLINICAL DATA:  Hypogastric pain and nausea. EXAM: ABDOMEN - 1 VIEW COMPARISON:  None. FINDINGS: Moderate amount of gas in the colon. The pattern does not suggest obstruction. Small bowel pattern is within normal limits. No significant soft tissue calcifications. Phleboliths in the pelvis. Curvature and degenerative change of the spine. 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 Electronically Signed   By: Nelson Chimes M.D.   On: 11/30/2018 19:44     Scheduled Meds: . amiodarone  400 mg Oral BID  . bisacodyl  10 mg Rectal Daily  .  Chlorhexidine Gluconate Cloth  6 each Topical Q0600  . feeding supplement (ENSURE ENLIVE)  237 mL Oral TID BM  . feeding supplement (PRO-STAT SUGAR FREE 64)  30 mL Oral BID  . insulin aspart  0-9 Units Subcutaneous Q4H  . multivitamin with minerals  1 tablet Oral Daily  . polyethylene glycol  17 g Oral Daily  . potassium chloride  40 mEq Oral Daily  . senna-docusate  1 tablet Oral BID  . sodium chloride flush  3 mL Intravenous Q12H    Continuous Infusions: . heparin 1,050 Units/hr (12/01/18 1024)  . piperacillin-tazobactam (ZOSYN)  IV 12.5 mL/hr at 12/01/18 0900     Time spent: 63mins, case discussed with  EP cardiology PA I have personally reviewed and interpreted on  12/01/2018 daily labs, tele strips, imagings as discussed above under date review session and assessment and plans.  I reviewed all nursing notes, pharmacy notes, consultant notes,  vitals, pertinent old records  I have discussed plan of care as described above with RN , patient on 12/01/2018   Florencia Reasons MD, PhD  [facility] Pager [phone]. If 7PM-7AM, please contact night-coverage at [email or web address], password [facility] 12/01/2018, 10:43 AM  LOS: 5 days

## 2018-12-01 NOTE — Progress Notes (Addendum)
Progress Note  Patient Name: Angela [name] Date of Encounter: 12/01/2018  Primary Cardiologist: Cristopher Peru, MD   Subjective   C/o pain in her rectal/vaginal area.  Inpatient Medications    Scheduled Meds: . bisacodyl  10 mg Rectal Daily  . Chlorhexidine Gluconate Cloth  6 each Topical Q0600  . feeding supplement (ENSURE ENLIVE)  237 mL Oral TID BM  . feeding supplement (PRO-STAT SUGAR FREE 64)  30 mL Oral BID  . insulin aspart  0-9 Units Subcutaneous Q4H  . mouth rinse  15 mL Mouth Rinse BID  . multivitamin with minerals  1 tablet Oral Daily  . polyethylene glycol  17 g Oral Daily  . potassium chloride  40 mEq Oral Daily  . senna-docusate  1 tablet Oral BID  . sodium chloride flush  3 mL Intravenous Q12H   Continuous Infusions: . amiodarone 30 mg/hr (12/01/18 0300)  . heparin 1,100 Units/hr (12/01/18 0300)  . piperacillin-tazobactam (ZOSYN)  IV 3.375 g (12/01/18 0511)   PRN Meds: acetaminophen **OR** acetaminophen, lip balm, morphine injection, ondansetron (ZOFRAN) IV, oxyCODONE   Vital Signs    Vitals:   12/01/18 0600 12/01/18 0700 12/01/18 0758 12/01/18 0803  BP: (!) 161/90 (!) 149/76    Pulse: 74 73  73  Resp: 19 18  13   Temp:   98.1 F (36.7 C)   TempSrc:   Oral   SpO2: 98% 100%  96%  Weight:      Height:        Intake/Output Summary (Last 24 hours) at 12/01/2018 0835 Last data filed at 12/01/2018 0500 Gross per 24 hour  Intake 927.6 ml  Output 750 ml  Net 177.6 ml   Filed Weights   11/29/18 0325 11/30/18 0411 12/01/18 0500  Weight: 68.6 kg 67.9 kg 65.3 kg    Telemetry     Atrial fib with biv pacing  - Personally Reviewed  ECG    No new EKGs - Personally Reviewed  Physical Exam    GEN: No acute distress.   Neck: No JVD Cardiac:  RRR,  Mechanical S1 appreciated, soft systolic murmur, rubs, or gallops.  Respiratory: no increased work of breathing, CTA b/l GI: Soft, nontender, non-distended  GU - deferred MS:  No edema; No deformity.  Neuro:  Nonfocal  Psych: blunted affect   Labs    Chemistry Recent Labs  Lab 11/26/18 1614  11/28/18 0152 11/29/18 0328 11/30/18 0113  NA 134*   < > 135 132* 130*  K 4.1   < > 3.5 4.0 4.3  CL 99   < > 104 102 102  CO2 20*   < > 22 22 20*  GLUCOSE 181*   < > 102* 112* 124*  BUN 17   < > 9 8 9   CREATININE 0.97   < > 0.72 0.82 0.96  CALCIUM 8.8*   < > 8.1* 8.0* 8.1*  PROT 7.0  --   --   --   --   ALBUMIN 2.7*  --   --   --   --   AST 41  --   --   --   --   ALT 44  --   --   --   --   ALKPHOS 76  --   --   --   --   BILITOT 1.9*  --   --   --   --   GFRNONAA 58*   < > >60 >60 58*  GFRAA >60   < > >  60 >60 >60  ANIONGAP 15   < > 9 8 8    < > = values in this interval not displayed.     Hematology Recent Labs  Lab 11/28/18 0152 11/29/18 0328 11/30/18 0113  WBC 16.8* 15.2* 13.9*  RBC 4.09 3.83* 4.12  HGB 11.7* 11.2* 12.0  HCT 36.1 33.8* 36.1  MCV 88.3 88.3 87.6  MCH 28.6 29.2 29.1  MCHC 32.4 33.1 33.2  RDW 14.9 15.2 15.3  PLT 225 255 274    Cardiac Enzymes Recent Labs  Lab 11/26/18 1614 11/26/18 2349 11/27/18 1440  TROPONINI 0.16* 0.40* 0.30*   No results for input(s): TROPIPOC in the last 168 hours.   BNPNo results for input(s): BNP, PROBNP in the last 168 hours.   DDimer No results for input(s): DDIMER in the last 168 hours.   Radiology    Dg Abd 1 View Result Date: 11/30/2018 CLINICAL DATA:  Hypogastric pain and nausea. EXAM: ABDOMEN - 1 VIEW COMPARISON:  None. FINDINGS: Moderate amount of gas in the colon. The pattern does not suggest obstruction. Small bowel pattern is within normal limits. No significant soft tissue calcifications. Phleboliths in the pelvis. Curvature and degenerative change of the spine. 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 Electronically Signed   By: Nelson Chimes M.D.   On: 11/30/2018 19:44    Cardiac Studies   10/27/2018: TTE  IMPRESSIONS 1. The left ventricle has a visually estimated ejection fraction of of 15-20%. The cavity size was severely dilated. Left ventricular diastolic Doppler parameters are indeterminate due to mitral valve replacement/repair. Left ventricular diffuse  hypokinesis. 2. The right ventricle has moderately reduced systolic function. The cavity was normal. There is no increase in right ventricular wall thickness. 3. Left atrial size was severely dilated. 4. Right atrial size was severely dilated. 5. A mechanical valve is present in the mitral position. Procedure Date: 1983. 6. Bjork-Shiley mechanical mitral valve prosthesis functioning properly. 7. The tricuspid valve is normal in structure. 8. The aortic valve is normal in structure. 9. The pulmonic valve was normal in structure. Pulmonic valve regurgitation is mild by color flow Doppler. 10. The inferior vena cava was normal in size with <50% respiratory variability.  FINDINGS Left Ventricle: The left ventricle has a visually estimated ejection fraction of of 15-20%. The cavity size was severely dilated. There is no increase in left ventricular wall thickness. Left ventricular diastolic Doppler parameters are indeterminate  due to mitral valve replacement/repair. Left ventricular diffuse hypokinesis. Right Ventricle: The right ventricle has moderately reduced systolic function. The cavity was normal. There is no increase in right ventricular wall thickness. Pacing wire/catheter visualized in the right ventricle. Left Atrium: left atrial size was severely dilated Right Atrium: right atrial size was severely dilated. Right atrial pressure is estimated at 8 mmHg. Interatrial Septum: No atrial level shunt detected by color flow Doppler. Pericardium: There is no evidence of pericardial effusion. Mitral Valve: The mitral valve has been repaired/replaced. Mitral valve regurgitation is mild by color flow Doppler. A mechanical valve is  present in the mitral position. Procedure Date: 75. Bjork-Shiley mechanical mitral valve prosthesis functioning  properly. Tricuspid Valve: The tricuspid valve is normal in structure. Tricuspid valve regurgitation is mild by color flow Doppler. Aortic Valve: The aortic valve is normal in structure. Aortic valve regurgitation was not visualized by color flow Doppler. Pulmonic Valve: The pulmonic valve was normal in structure.  Pulmonic valve regurgitation is mild by color flow Doppler. Venous: The inferior vena cava is normal in size with less than 50% respiratory variability.    Patient Profile     73 y.o. 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 (s/p embolectomy), permanent Afib (has had av node ablation), rheumatic VHD s/p mechanical MVR admitted with VT and syncope in the setting of GU/rectal abscess/gangrene  Device information: SJM CRT- ICD, implanted 2009, gen change 09/05/14 Patient has an abandoned pacing system on the right side by CXR Device interrogation (MOD) battery and lead measurements are good (no LV threshold completed) Base programming VVIR 70bpm (Patient has record of AVNode ablation though has R waves 60's) The patient has had 8 NSVT events since march 13 to yesterday, 2 are PMVT Yesterday 4 true VF events, all successfully terminated with one shock each   Assessment & Plan    1. VT      None on telemetry      amio down to 30mg /hr yesterday, and she will continue oral amiodarone 400 bid for 6 more days     Remains off Lidocaine gtt stopped Sunday     2. Chronic systolic heart failure, h/o NICM     LVEF 15-20%      she appears to be euvolemic.      Monitor closely for volume OL      Continue her current meds.     cumulatively she is fluid + (+6243ml)   3. Atrial fib       her VR is well controlled. Continue her current meds.  4. Mechanical  MVR Remains on anticoagulated with heparin gtt post-op to resume warfarin when cleared to by surgical service As she is going to be several days before she will become therapeutic,  would restart coumadin Does not look like there are plans for more surgery Deferred to medicine and surgical service   4. Infection -      11/28/2018: S/p Incision, drainage & Excisional DEBRIDEMENT with scalpel of right Perineal/buttock Fournier's gangrene; pulsatile lavage     Continue with medicine/surgical services  For questions or updates, please contact [place] Please consult [email or web address] for contact info under Cardiology/STEMI.   Signed, Baldwin Jamaica, PA-C  12/01/2018, 8:35 AM  Patient ID: [name], female   DOB: 1945/12/31, 73 y.o.   MRN: [id number]    EP Attending  Patient seen and examined. Agree with above. The patient has done well after surgery with the exception of post op pain. We will continue oral amiodarone. I would start warfarin today unless you are certain that she will need more surgical debridement.   Mikle Bosworth.D.

## 2018-12-01 NOTE — Evaluation (Signed)
Clinical/Bedside Swallow Evaluation Patient Details  Name: Angela [name] MRN: [id number] Date of Birth: [birth year]  Today's Date: 12/01/2018 Time: SLP Start Time (ACUTE ONLY): 1512 SLP Stop Time (ACUTE ONLY): 1530 SLP Time Calculation (min) (ACUTE ONLY): 18 min  Past Medical History:  Past Medical History:  Diagnosis Date  . Abnormality of gait 08/23/2014  . Arthritis   . Atrial fibrillation ([place])    A.  Chronic Coumadin  . Cardiac arrest - ventricular fibrillation    A.  12/2007;  B. 01/2008 St. Jude Promote Bi-V ICD placed  . CVA (cerebral vascular accident) ([place])   . Diverticular disease   . DVT (deep venous thrombosis) ([place])   . 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 History of colon cancer    A.  1999 - T3, N1  chemotherapy  . Internal hemorrhoids   . Memory deficit 08/23/2014  . Nonischemic cardiomyopathy ([place])    a.  11/2001 - Cath - NL Cors; b. ECHO 09/22/11: EF 20%, MVR normal, moderate LAE, mild RAE c. ECHO (08/2012): ED 20%, diff HK, LA mod dilated, mild/mod TR, RV mild/mod decreased sys fx  . Rectal bleeding 04/28/2017  . Rheumatic heart disease    A. 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 Sick sinus syndrome ([place])    A.  s/p pacer in 1995.  B.    . Systolic CHF, chronic ([place])    A.  01/2008 Echo - EF 10-20%   Past Surgical History:  Past Surgical History:  Procedure Laterality Date  . ABDOMINAL HYSTERECTOMY    . AV NODE ABLATION N/A 09/23/2011   Procedure: AV NODE ABLATION;  Surgeon: Evans Lance, MD;  Location: [facility] C[facility] LAB;  Service: Cardiovascular;  Laterality: N/A;  . BIV ICD GENERTAOR CHANGE OUT N/A 09/05/2014   Procedure: BIV ICD GENERTAOR CHANGE OUT;  Surgeon: Evans Lance, MD;  Location: [facility] CATH LAB;  Service: Cardiovascular;  Laterality: N/A;  . Biventricular AICD    . COLON SURGERY  1999  . History of echocardiogram  2003, 2007, 2009  . INCISION AND DRAINAGE PERIRECTAL ABSCESS Right 11/28/2018   Procedure: IRRIGATION AND DEBRIDEMENT PERIRECTAL ABSCESS;  Surgeon: Greer Pickerel, MD;  Location: [facility];  Service: General;  Laterality: Right;  . Mitral Valve Replacement, Bjork-Shiley valve  [id number]   HPI:  73 year old female who presented to the ED on 4/2 from surgical center after having a syncopal episode in the lobby associated with ICD shocks. Pt is s/p I&D of R perineal/buttock Fournier's gangrene on 4/4. Pt had MBS in 2009, but report is not available. Swallow evaluation ordered due to report of difficulty swallowing pills. PMH of CVA, CHF, NICM, s/p mechanical MVR on chronic coumadin, LBBB s/p biventricular ICD placement, atrial fibrillation, dementia, colon cancer   Assessment / Plan / Recommendation Clinical Impression  Pt has facial grimacing as she swallows, describing a sore throat. She consumed thin liquids, purees, and pills whole with a liquid wash with an otherwise functional appearing swallow. Intake was limited due to pt reports of nausea (RN aware and administered meds), although per RN report, intake was limited PTA as well. Would favor keeping pt on regular solids and thin liquids to have fuller range of options to maximize intake. Will f/u for tolerance.  SLP Visit Diagnosis: Dysphagia, unspecified (R13.10)    Aspiration Risk  Mild aspiration risk    Diet Recommendation Regular;Thin liquid   Liquid Administration  via: Cup;Straw Medication Administration: Whole meds with liquid Supervision: Staff to assist with self feeding;Full supervision/cueing for compensatory strategies Compensations: Slow rate;Small sips/bites Postural Changes: Seated upright at 90 degrees    Other  Recommendations Oral Care Recommendations: Oral care BID   Follow up Recommendations None      Frequency and Duration min 2x/week  1 week       Prognosis Prognosis for Safe Diet Advancement: Good      Swallow Study   General HPI: 73 year old female who presented to the ED on 4/2 from surgical  center after having a syncopal episode in the lobby associated with ICD shocks. Pt is s/p I&D of R perineal/buttock Fournier's gangrene on 4/4. Pt had MBS in 2009, but report is not available. Swallow evaluation ordered due to report of difficulty swallowing pills. PMH of CVA, CHF, NICM, s/p mechanical MVR on chronic coumadin, LBBB s/p biventricular ICD placement, atrial fibrillation, dementia, colon cancer Type of Study: Bedside Swallow Evaluation Previous Swallow Assessment: see HPI Diet Prior to this Study: Regular;Thin liquids Temperature Spikes Noted: No Respiratory Status: Room air History of Recent Intubation: Yes(for procedure only) Length of Intubations (days): (for procedure only) Date extubated: 11/28/18 Behavior/Cognition: Alert;Cooperative;Requires cueing Oral Cavity Assessment: Within Functional Limits Oral Care Completed by SLP: No Oral Cavity - Dentition: Dentures, top;Dentures, bottom Self-Feeding Abilities: Needs assist Patient Positioning: Upright in bed Baseline Vocal Quality: Normal Volitional Cough: Weak Volitional Swallow: Able to elicit    Oral/Motor/Sensory Function Overall Oral Motor/Sensory Function: Within functional limits   Ice Chips Ice chips: Not tested   Thin Liquid Thin Liquid: Impaired Presentation: Cup;Straw Pharyngeal  Phase Impairments: Other (comments)(facial grimacing)    Nectar Thick Nectar Thick Liquid: Not tested   Honey Thick Honey Thick Liquid: Not tested   Puree Puree: Impaired Presentation: Spoon Pharyngeal Phase Impairments: Other (comments)(facial grimacing)   Solid     Solid: Not tested      Angela [name] 12/01/2018,4:22 PM  Pollyann Glen, M.A. [place] Acute Environmental education officer [phone] Office [phone]

## 2018-12-02 DIAGNOSIS — N7689 Other specified inflammation of vagina and vulva: Secondary | ICD-10-CM

## 2018-12-02 DIAGNOSIS — R7989 Other specified abnormal findings of blood chemistry: Secondary | ICD-10-CM

## 2018-12-02 DIAGNOSIS — N7682 Fournier disease of vagina and vulva: Secondary | ICD-10-CM | POA: Diagnosis present

## 2018-12-02 LAB — BASIC METABOLIC PANEL
Anion gap: 9 (ref 5–15)
BUN: 10 mg/dL (ref 8–23)
CO2: 20 mmol/L — ABNORMAL LOW (ref 22–32)
Calcium: 8.7 mg/dL — ABNORMAL LOW (ref 8.9–10.3)
Chloride: 103 mmol/L (ref 98–111)
Creatinine, Ser: 1.03 mg/dL — ABNORMAL HIGH (ref 0.44–1.00)
GFR calc Af Amer: >60 mL/min (ref >60)
GFR calc non Af Amer: 54 mL/min — ABNORMAL LOW (ref >60)
Glucose, Bld: 86 mg/dL (ref 70–99)
Potassium: 5.1 mmol/L (ref 3.5–5.1)
Sodium: 132 mmol/L — ABNORMAL LOW (ref 135–145)

## 2018-12-02 LAB — PROTIME-INR
INR: 2.3 — ABNORMAL HIGH (ref 0.8–1.2)
Prothrombin Time: 24.8 seconds — ABNORMAL HIGH (ref 11.4–15.2)

## 2018-12-02 LAB — CBC
HCT: 39.6 % (ref 36.0–46.0)
Hemoglobin: 12.9 g/dL (ref 12.0–15.0)
MCH: 28.9 pg (ref 26.0–34.0)
MCHC: 32.6 g/dL (ref 30.0–36.0)
MCV: 88.8 fL (ref 80.0–100.0)
Platelets: 292 10*3/uL (ref 150–400)
RBC: 4.46 MIL/uL (ref 3.87–5.11)
RDW: 15.2 % (ref 11.5–15.5)
WBC: 10.5 10*3/uL (ref 4.0–10.5)
nRBC: 0 % (ref 0.0–0.2)

## 2018-12-02 LAB — CULTURE, BLOOD (ROUTINE X 2)
Culture: NO GROWTH
Culture: NO GROWTH
Special Requests: ADEQUATE
Special Requests: ADEQUATE

## 2018-12-02 LAB — AEROBIC/ANAEROBIC CULTURE W GRAM STAIN (SURGICAL/DEEP WOUND): Gram Stain: NONE SEEN

## 2018-12-02 LAB — HEPARIN LEVEL (UNFRACTIONATED): Heparin Unfractionated: 0.26 IU/mL — ABNORMAL LOW (ref 0.30–0.70)

## 2018-12-02 LAB — GLUCOSE, CAPILLARY
Glucose-Capillary: 87 mg/dL (ref 70–99)
Glucose-Capillary: 70 mg/dL (ref 70–99)
Glucose-Capillary: 106 mg/dL — ABNORMAL HIGH (ref 70–99)
Glucose-Capillary: 83 mg/dL (ref 70–99)

## 2018-12-02 MED ORDER — PIPERACILLIN-TAZOBACTAM 3.375 G IVPB
3.3750 g | Freq: Three times a day (TID) | INTRAVENOUS | Status: DC
  Administered 2018-12-02 – 2018-12-03 (×3): 3.375 g via INTRAVENOUS
  Filled 2018-12-02 (×4): qty 50

## 2018-12-02 MED ORDER — AMOXICILLIN-POT CLAVULANATE 875-125 MG PO TABS
1.0000 | ORAL_TABLET | Freq: Two times a day (BID) | ORAL | Status: DC
  Filled 2018-12-02 (×2): qty 1

## 2018-12-02 MED ORDER — WARFARIN 0.5 MG HALF TABLET
0.5000 mg | ORAL_TABLET | Freq: Once | ORAL | Status: AC
  Administered 2018-12-02: 0.5 mg via ORAL
  Filled 2018-12-02 (×2): qty 1

## 2018-12-02 NOTE — Progress Notes (Addendum)
[place] for heparin Indication: AFib, mechanical MVR, hx DVT  No Known Allergies  Patient Measurements: Height: 5\' 6"  (167.6 cm) Weight: 143 lb 11.8 oz (65.2 kg) IBW/kg (Calculated) : 59.3 Heparin Dosing Weight: 60kg  Vital Signs: Temp: 98.1 F (36.7 C) (04/08 0400) Temp Source: Oral (04/08 0400) BP: 109/58 (04/08 0700) Pulse Rate: 73 (04/08 0700)  Labs: Recent Labs    11/30/18 0113  11/30/18 1221 12/01/18 0754 12/02/18 0248  HGB 12.0  --   --  12.8 12.9  HCT 36.1  --   --  38.6 39.6  PLT 274  --   --  278 292  LABPROT 19.7*  --   --  22.3* 24.8*  INR 1.7*  --   --  2.0* 2.3*  HEPARINUNFRC  --    < > 0.45 0.49 0.26*  CREATININE 0.96  --   --  1.01* 1.03*   < > = values in this interval not displayed.    Estimated Creatinine Clearance: 44.9 mL/min (A) (by C-G formula based on SCr of 1.03 mg/dL (H)).   Assessment: 43 yoF on warfarin PTA for PAF, hx DVT, and mechanical MVR. Pt admitted with supratherapeutic INR of 9.1 s/p FFP and vitamin K due to urgent need for I&D on 4/4. Surgery okayed resuming heparin infusion - will avoid boluses and target low goal given recent surgery.  Heparin level came back therapeutic at 0.26, on 1050 units/hr. Was on higher end of therapeutic goal on 1100 units/hr but low on 1050 units/hr. Hgb 12.9, plt 292. No s/sx of bleeding. No infusion issues. INR 2.3 today - LD warfarin on 4/2. Minimal oral intake per chart documentation (0-5%% for documented meals).  PTA regimen is 6 mg daily.   Goal of Therapy:  INR 2.5-3.5 Heparin level 0.3-0.5 units/ml Monitor platelets by anticoagulation protocol: Yes   Plan:  -Order warfarin 0.5 mg tonight -Increase heparin infusion to 1100 units/hr -Daily CBC, Heparin Level  Thanks for allowing pharmacy to be a part of this patient's care.  Antonietta Jewel, PharmD, [place] Clinical Pharmacist  Pager: [phone] Phone: [phone]

## 2018-12-02 NOTE — Progress Notes (Addendum)
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  She was at the surgeon's office for evaluation of a perirectal infection, CT noted showed multiple foci of gas. She had a syncopal episode with icd shock at the surgical center, she is sent to the ER. Cardiology consulted, she is started on amiodarone drip She is found to have elevated INR, she received vit k and FFP on admission per general surgery recommendation. Subjective: patient had an uneventful night,  on room air at rest, no fever. She remains on heparin drip. She knows is in hospital, doesn't know why she is here, what she came in for, what has been done, the date/ year or month.  She knows [place]. Per RN patient has poor appetite, does not eat much at all.    Assessment/Plan: Principal Problem:   Syncope Active Problems:   Systolic CHF, chronic (HCC)   Ventricular fibrillation (HCC)   S/P Mechanical MVR (mitral valve replacement)   ICD (implantable cardioverter-defibrillator), biventricular, in situ   Elevated troponin   Ventricular tachycardia (HCC)   Elevated INR   Perineal abscess   Perirectal abscess  Right perineal/perirectal abscess with presence of gas/Fournier's gangrene - Incision, drainage & Excisional DEBRIDEMENT with scalpel of right Perineal/buttock Fournier's gangrene; pulsatile lavage on 4/4 -general surgery oked to resume heparin drip on 4/5  -blood cultures no growth, mrsa screening negative, wound culture preliminary  result with pansensitive ecoli, rare candida and mixed anaerobic flora - Started IV vancomycin and Zosyn on admit, general  surgery recommending ok to narrow abx , will get rec's from pharmacy regarding abx choice and duration -wound care per general surgery , may need hydrotherapy per general surgery  Syncope/Ventricular tachyarrhythmia with ventricular fibrillation s/p ICD shock x5: - was transferred to cardiac ICU due to recurrent vfib while on  IV amiodarone drip, she was started on lidocaine drip - is better, now is off of the lidocaine drip and amiodarone drip. She was started on oral amiodarone - Cardiology /EP consulted and signed off  Afib, SSS s/p PPM, s/p mechanical MVR on coumadin, with supratherapeutic INR on presentation - INR reversed for surgery, patient restarted back to heparin drip on 4/5 per general surgery recommendation  - General surgery ok'd to Resume coumadin,  Coumadin resumed on 4/7, continue heparin drip until INR at goal   Nonischemic cardiomyopathy, EF 15% s/o biVICD -Recent hospitalization for heart failure from 3/2 to 3/4 -currently euvolemic to dry -close monitor volume status, cardiology has signed off  Dementia/FTT -she had h/o colon cancer, she has h/o gi bleed, palliative care involved in 04/2017, family was not receptive to palliative care at the time per chart review - regarding EOL issues I spoke w/ pt's husband and son today on the phone and gave a overall poor prognosis/ long road to go based on pt's wound size, not eating and all her comorbidities. They did not seem to respond in either negative/ positive way but we agreed to keep discusions open about how much we are going to do if she is not getting better and also asked them to consider meeting w/ pall care team which I will ask  them about again tomorrow.   GI/ nutrition/ BM - patient not eating much.  No bm for several days > KUB obtained on 4/6 unremarkable, she was started on stool regimen by [facility] then started to have diarrhea so these meds were dc'd today (colace, miralax, senokot).   Code Status: full Family  Communication: patient  Disposition Plan: transfer to med- tele bed   Kelly Splinter / [facility] [phone] 12/02/2018, 2:52 PM    Consultants:  Cardiology/EP  General surgery  Procedures:  Incision, drainage & Excisional DEBRIDEMENT with scalpel of right Perineal/buttock Fournier's gangrene; pulsatile lavage on 4/4  Antibiotics:  Vanc from admission to 4/6  zosyn from admission   Objective: BP (!) 90/58   Pulse 73   Temp 98.2 F (36.8 C) (Oral)   Resp 11   Ht 5\' 6"  (1.676 m)   Wt 65.2 kg   SpO2 95%   BMI 23.20 kg/m   Intake/Output Summary (Last 24 hours) at 12/02/2018 1440 Last data filed at 12/02/2018 1200 Gross per 24 hour  Intake 333.04 ml  Output 1060 ml  Net -726.96 ml   Filed Weights   11/30/18 0411 12/01/18 0500 12/02/18 0418  Weight: 67.9 kg 65.3 kg 65.2 kg    Exam: Patient is examined daily including today on 12/02/2018, exams remain the same as of yesterday except that has changed    General:  Chronically ill, weak, but fully awake, answers simple questions  Cardiovascular: paced rhythm  Respiratory: diminished, no wheezing, no rales, no rhonchi  Abdomen: post op changes (perioneal/buttock) , positive BS  Musculoskeletal: No Edema  Neuro: awake, answers simple questions   Data Reviewed: Basic Metabolic Panel: Recent Labs  Lab 11/26/18 1614 11/27/18 1440 11/27/18 2123 11/28/18 0152 11/29/18 0328 11/30/18 0113 12/01/18 0754 12/02/18 0248  NA 134* 138 134* 135 132* 130* 135 132*  K 4.1 3.0* 3.2* 3.5 4.0 4.3 4.7 5.1  CL 99 106 100 104 102 102 102 103  CO2 20* 23 22 22 22  20* 22 20*  GLUCOSE 181* 93 115* 102* 112* 124* 97 86  BUN 17 8 9 9 8 9 10 10   CREATININE 0.97 0.70 0.76 0.72 0.82 0.96 1.01* 1.03*  CALCIUM 8.8* 7.8* 8.0* 8.1* 8.0* 8.1* 8.8* 8.7*  MG 1.8 1.9 2.1  --  1.9  --  1.9  --    Liver Function Tests: Recent Labs  Lab 11/26/18 1614  AST 41  ALT 44  ALKPHOS 76  BILITOT 1.9*  PROT 7.0  ALBUMIN 2.7*   No results for  input(s): LIPASE, AMYLASE in the last 168 hours. No results for input(s): AMMONIA in the last 168 hours. CBC: Recent Labs  Lab 11/26/18 1614 11/27/18 1440 11/28/18 0152 11/29/18 0328 11/30/18 0113 12/01/18 0754 12/02/18 0248  WBC 20.2* 18.4* 16.8* 15.2* 13.9* 10.7* 10.5  NEUTROABS 18.5* 16.9*  --   --   --  9.2*  --   HGB 15.3* 11.5* 11.7* 11.2* 12.0 12.8 12.9  HCT 47.5* 35.6* 36.1 33.8* 36.1 38.6 39.6  MCV 88.3 88.1 88.3 88.3 87.6 88.3 88.8  PLT 299 240 225 255 274 278 292   Cardiac Enzymes:   Recent Labs  Lab 11/26/18 1614 11/26/18 2349 11/27/18 1440  TROPONINI 0.16* 0.40* 0.30*   BNP (last 3 results) Recent Labs    10/26/18 1855  BNP 1,092.7*    ProBNP (last 3 results) No results for input(s): PROBNP in the last 8760 hours.  CBG: Recent Labs  Lab 12/01/18 1956 12/01/18 2314  12/02/18 0345 12/02/18 0801 12/02/18 1153  GLUCAP 102* 91 87 70 106*    Recent Results (from the past 240 hour(s))  Culture, blood (Routine X 2) w Reflex to ID Panel     Status: None   Collection Time: 11/26/18 11:45 PM  Result Value Ref Range Status   Specimen Description BLOOD RIGHT HAND  Final   Special Requests   Final    BOTTLES DRAWN AEROBIC ONLY Blood Culture adequate volume   Culture   Final    NO GROWTH 5 DAYS Performed at [facility], [place] [address] [place], [place] [address]    Report Status 12/02/2018 FINAL  Final  Culture, blood (Routine X 2) w Reflex to ID Panel     Status: None   Collection Time: 11/26/18 11:59 PM  Result Value Ref Range Status   Specimen Description BLOOD LEFT HAND  Final   Special Requests   Final    BOTTLES DRAWN AEROBIC ONLY Blood Culture adequate volume   Culture   Final    NO GROWTH 5 DAYS Performed at [facility], [place] [address], [place], [place] [address]    Report Status 12/02/2018 FINAL  Final  MRSA PCR Screening     Status: None   Collection Time: 11/27/18 12:41 AM  Result Value Ref Range Status   MRSA by PCR  NEGATIVE NEGATIVE Final    Comment:        The GeneXpert MRSA Assay (FDA approved for NASAL specimens only), is one component of a comprehensive MRSA colonization surveillance program. It is not intended to diagnose MRSA infection nor to guide or monitor treatment for MRSA infections. Performed at [facility], [place] [address] [place], [place] [address]   Aerobic/Anaerobic Culture (surgical/deep wound)     Status: None   Collection Time: 11/28/18 10:32 AM  Result Value Ref Range Status   Specimen Description ABSCESS BUTTOCKS  Final   Special Requests PATIENT ON FOLLOWING VANC AND ZOSYN  Final   Gram Stain   Final    NO WBC SEEN RARE GRAM POSITIVE COCCI IN PAIRS RARE GRAM POSITIVE RODS Performed at [facility], [address], [place], [place] [address]    Culture   Final    RARE ESCHERICHIA COLI RARE CANDIDA ALBICANS MIXED ANAEROBIC FLORA PRESENT.  CALL LAB IF FURTHER IID REQUIRED.    Report Status 12/02/2018 FINAL  Final   Organism ID, Bacteria ESCHERICHIA COLI  Final      Susceptibility   Escherichia coli - MIC*    AMPICILLIN <=2 SENSITIVE Sensitive     CEFAZOLIN <=4 SENSITIVE Sensitive     CEFEPIME <=1 SENSITIVE Sensitive     CEFTAZIDIME <=1 SENSITIVE Sensitive     CEFTRIAXONE <=1 SENSITIVE Sensitive     CIPROFLOXACIN <=0.25 SENSITIVE Sensitive     GENTAMICIN <=1 SENSITIVE Sensitive     IMIPENEM <=0.25 SENSITIVE Sensitive     TRIMETH/SULFA <=20 SENSITIVE Sensitive     AMPICILLIN/SULBACTAM <=2 SENSITIVE Sensitive     PIP/TAZO <=4 SENSITIVE Sensitive     Extended ESBL NEGATIVE Sensitive     * RARE ESCHERICHIA COLI     Studies: No results found.  Scheduled Meds: . amiodarone  400 mg Oral BID  . Chlorhexidine Gluconate Cloth  6 each Topical Q0600  . feeding supplement (ENSURE ENLIVE)  237 mL Oral TID BM  . feeding supplement (PRO-STAT SUGAR FREE 64)  30 mL Oral BID  . insulin aspart  0-9  Units Subcutaneous Q4H  . multivitamin with minerals  1  tablet Oral Daily  . sodium chloride flush  3 mL Intravenous Q12H  . warfarin  0.5 mg Oral ONCE-1800  . Warfarin - Pharmacist Dosing Inpatient   Does not apply q1800    Continuous Infusions: . heparin 1,100 Units/hr (12/02/18 1200)  . piperacillin-tazobactam (ZOSYN)  IV        [facility] Pager [phone]. If 7PM-7AM, please contact night-coverage at [email or web address], password [facility] 12/02/2018, 2:40 PM  LOS: 6 days

## 2018-12-02 NOTE — Progress Notes (Signed)
Patients husband and son called for an update. They reported that she has had a decreased appetite for some months now. They were giving her Breeze Boost at home. They stated she doesn't like Ensure or anything that resembles milk products. They also said she is a picky eater. I explained that we have offered her several options, but she keeps refusing. She has only had a few sips of water.  Family requested that when offering her food and fluids, we tell her and will get her home faster.  I explained that she has been in a lot of pain today and that the medicine is helping but make her sleepy. Will continue to monitor closely.

## 2018-12-02 NOTE — Progress Notes (Signed)
Physical Therapy Treatment Patient Details Name: Angela [name] MRN: [id number] DOB: 1945/12/27 Today's Date: 12/02/2018    History of Present Illness Pt is a 73 y.o. female admitted from surgeon's office (there fore for evaluation of perirectal infection) on 11/26/18 after syncopal episode; found to be in V. tach which degenerated into v-fib requiring shock therapy from her ICD with multiple episodes of NSVT (per ICD interrogation). Now s/p I&D of perineal abscess on 4/4. PMH includes HF, NICM, s/p MVR, s/p ICD, afib, memory deficit.   PT Comments    Pt presents with worsening pain and impaired cognition (pt familiar to me from recent admission 10/2018, ambulatory with [facility] and interacting more appropriately despite h/o dementia). Able to participate in minimal bed level mobility, including BLE therex; limited desire to participate secondary to pain. Per chart, palliative care involved in the past but family reluctant; ask that we reconsider a palliative care order.    Follow Up Recommendations  SNF;Supervision/Assistance - 24 hour     Equipment Recommendations  (TBD)    Recommendations for Other Services       Precautions / Restrictions Precautions Precautions: Fall Precaution Comments: R buttock/perineal open wound Restrictions Weight Bearing Restrictions: No    Mobility  Bed Mobility Overal bed mobility: Needs Assistance Bed Mobility: Rolling Rolling: Mod assist         General bed mobility comments: ModA to roll towards L-side, able to assist well with RUE/RLE with cues for sequencing roll; limited by pain. Now bowel incontinence noted  Transfers                    Ambulation/Gait                 Stairs             Wheelchair Mobility    Modified Rankin (Stroke Patients Only)       Balance                                            Cognition Arousal/Alertness: Awake/alert Behavior During Therapy: Anxious Overall  Cognitive Status: No family/caregiver present to determine baseline cognitive functioning Area of Impairment: Orientation;Attention;Memory;Following commands;Safety/judgement;Awareness;Problem solving                 Orientation Level: Disoriented to;Place;Time;Situation Current Attention Level: Sustained Memory: Decreased short-term memory Following Commands: Follows one step commands inconsistently;Follows one step commands with increased time Safety/Judgement: Decreased awareness of deficits Awareness: Intellectual Problem Solving: Slow processing;Decreased initiation;Difficulty sequencing;Requires verbal cues General Comments: Able to state name and husband's name, answer a few more questions regarding PLOF. Still mainly yes/no; following ~50-75% simple commands appropriately. Not interested in much activity due to pain      Exercises General Exercises - Lower Extremity Ankle Circles/Pumps: AROM;Both;Supine Heel Slides: AROM;Both;Supine Hip ABduction/ADduction: AROM;Both;Supine Straight Leg Raises: AROM;Both;Supine    General Comments        Pertinent Vitals/Pain Pain Assessment: No/denies pain(Pt denies pain, but grimacing when swallowing) Faces Pain Scale: Hurts even more Pain Location: "all over" Pain Descriptors / Indicators: Sore Pain Intervention(s): Monitored during session;Limited activity within patient's tolerance;Premedicated before session    Home Living                      Prior Function            PT Goals (current goals  can now be found in the care plan section) Acute Rehab PT Goals Patient Stated Goal: none stated PT Goal Formulation: Patient unable to participate in goal setting Time For Goal Achievement: 12/14/18 Potential to Achieve Goals: Poor Progress towards PT goals: Not progressing toward goals - comment    Frequency    Min 2X/week      PT Plan Current plan remains appropriate    Co-evaluation               AM-PAC PT "6 Clicks" Mobility   Outcome Measure  Help needed turning from your back to your side while in a flat bed without using bedrails?: A Lot Help needed moving from lying on your back to sitting on the side of a flat bed without using bedrails?: A Lot Help needed moving to and from a bed to a chair (including a wheelchair)?: A Lot Help needed standing up from a chair using your arms (e.g., wheelchair or bedside chair)?: Total Help needed to walk in hospital room?: Total Help needed climbing 3-5 steps with a railing? : Total 6 Click Score: 9    End of Session Equipment Utilized During Treatment: Gait belt Activity Tolerance: Patient limited by pain Patient left: in bed;with call bell/phone within reach Nurse Communication: Mobility status PT Visit Diagnosis: Difficulty in walking, not elsewhere classified (R26.2);Pain     Time: 1010-1023 PT Time Calculation (min) (ACUTE ONLY): 13 min  Charges:  $Therapeutic Exercise: 8-22 mins                    Mabeline Caras, PT, DPT Acute Rehabilitation Services  Pager [phone] Office [place] 12/02/2018, 12:43 PM

## 2018-12-02 NOTE — Progress Notes (Signed)
[facility] Progress Note  4 Days Post-Op  Subjective: CC: wound Patient pleasantly demented, reports her arm is sore this AM. Per RN patient having liquid diarrhea and is requiring frequent dressing changes.   Objective: Vital signs in last 24 hours: Temp:  [97.4 F (36.3 C)-98.4 F (36.9 C)] 98.2 F (36.8 C) (04/08 0759) Pulse Rate:  [69-76] 73 (04/08 0700) Resp:  [8-26] 20 (04/08 0700) BP: (97-163)/(52-100) 109/58 (04/08 0700) SpO2:  [94 %-100 %] 95 % (04/08 0700) Weight:  [65.2 kg] 65.2 kg (04/08 0418) Last BM Date: 12/01/18  Intake/Output from previous day: 04/07 0701 - 04/08 0700 In: 530.7 [I.V.:431; IV Piggyback:99.8] Out: 1390 [Urine:1390] Intake/Output this shift: No intake/output data recorded.  PE: Gen:  Alert, NAD, pleasant Card:  Regular rate and rhythm Pulm:  Normal effort Abd: Soft, non-tender, non-distended GU: foley present, wound as below     Lab Results:  Recent Labs    12/01/18 0754 12/02/18 0248  WBC 10.7* 10.5  HGB 12.8 12.9  HCT 38.6 39.6  PLT 278 292   BMET Recent Labs    12/01/18 0754 12/02/18 0248  NA 135 132*  K 4.7 5.1  CL 102 103  CO2 22 20*  GLUCOSE 97 86  BUN 10 10  CREATININE 1.01* 1.03*  CALCIUM 8.8* 8.7*   PT/INR Recent Labs    12/01/18 0754 12/02/18 0248  LABPROT 22.3* 24.8*  INR 2.0* 2.3*   CMP     Component Value Date/Time   NA 132 (L) 12/02/2018 0248   K 5.1 12/02/2018 0248   CL 103 12/02/2018 0248   CO2 20 (L) 12/02/2018 0248   GLUCOSE 86 12/02/2018 0248   BUN 10 12/02/2018 0248   CREATININE 1.03 (H) 12/02/2018 0248   CALCIUM 8.7 (L) 12/02/2018 0248   PROT 7.0 11/26/2018 1614   ALBUMIN 2.7 (L) 11/26/2018 1614   AST 41 11/26/2018 1614   ALT 44 11/26/2018 1614   ALKPHOS 76 11/26/2018 1614   BILITOT 1.9 (H) 11/26/2018 1614   GFRNONAA 54 (L) 12/02/2018 0248   GFRAA >60 12/02/2018 0248   Lipase  No results found for: LIPASE     Studies/Results: Dg Abd 1 View  Result Date:  11/30/2018 CLINICAL DATA:  Hypogastric pain and nausea. EXAM: ABDOMEN - 1 VIEW COMPARISON:  None. FINDINGS: Moderate amount of gas in the colon. The pattern does not suggest obstruction. Small bowel pattern is within normal limits. No significant soft tissue calcifications. Phleboliths in the pelvis. Curvature and degenerative change of the spine. 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 Electronically Signed   By: Nelson Chimes M.D.   On: 11/30/2018 19:44    Anti-infectives: Anti-infectives (From admission, onward)   Start     Dose/Rate Route Frequency Ordered Stop   11/27/18 2300  vancomycin (VANCOCIN) 1,000 mg in sodium chloride 0.9 % 250 mL IVPB  Status:  Discontinued     1,000 mg 250 mL/hr over 60 Minutes Intravenous Every 24 hours 11/26/18 2336 11/30/18 2141   11/27/18 0600  piperacillin-tazobactam (ZOSYN) IVPB 3.375 g     3.375 g 12.5 mL/hr over 240 Minutes Intravenous Every 8 hours 11/26/18 2336     11/26/18 2330  vancomycin (VANCOCIN) 1,250 mg in sodium chloride 0.9 % 250 mL IVPB     1,250 mg 166.7 mL/hr over 90 Minutes Intravenous STAT 11/26/18 2324 11/27/18 0345   11/26/18 2230  piperacillin-tazobactam (ZOSYN) IVPB 3.375 g     3.375 g 100 mL/hr over 30 Minutes Intravenous  Once 11/26/18 2218 11/26/18 2302       Assessment/Plan V-tach with V fib, s/o ICD shock x4  Nonischemic cardiomyopathy with EF of 15% Atrial fibrillation  Peripheral vascular disease Hx of CVA Hx of colon cancer Dementia/FTT  Right buttock/perineal abscess -s/p I&D 11/28/18 Dr. Redmond Pulling - wound cleaning up, will stop GI regimen to try to thicken stools some today - continue BID dressing changes - cxs grew pan-sensitive E. Coli - can narrow abx and transition to PO  FEN -reg diet  VTE -SCDs, heparin gtt ID -Vanc 4/2> 4/6; Zosyn 4/2>>  LOS: 6 days    Angela [name] , [facility] Surgery  12/02/2018, 8:59 AM Pager: [place]: [phone]

## 2018-12-02 NOTE — NC FL2 (Signed)
[place] MEDICAID FL2 LEVEL OF CARE SCREENING TOOL     IDENTIFICATION  Patient Name: Angela [name] Birthdate: [birth year] Sex: female Admission Date (Current Location): 11/26/2018  [facility] and [place] Number:  Herbalist and Address:  [facility], [place] [address] [place], [place] [address]      Provider Number: [id number]  Attending Physician Name and Address:  Roney Jaffe, MD  Relative Name and Phone Number:       Current Level of Care: Hospital Recommended Level of Care: [place] Prior Approval Number:    Date Approved/Denied:   PASRR Number: [id number]  Discharge Plan: SNF    Current Diagnoses: Patient Active Problem List   Diagnosis Date Noted  . 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 Perirectal abscess   . Syncope 11/26/2018  . Ventricular tachycardia ([place]) 11/26/2018  . Elevated INR 11/26/2018  . Perineal abscess 11/26/2018  . Acute on chronic systolic (congestive) heart failure ([place]) 10/27/2018  . Elevated troponin 10/26/2018  . Anemia 10/26/2018  . Palliative care by specialist   . Rectal bleeding 04/28/2017  . Acute blood loss anemia 04/28/2017  . ICD (implantable cardioverter-defibrillator), biventricular, in situ 08/24/2014  . Memory deficit 08/23/2014  . Abnormality of gait 08/23/2014  . Dementia ([place]) 12/17/2013  . 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 Hypotension due to drugs 07/12/2012  . Hypokalemia 07/12/2012  . NSVT (nonsustained ventricular tachycardia) ([place]) 07/12/2012  . Acute on chronic systolic heart failure ([place]) 07/07/2012  . Ventricular fibrillation ([place]) 11/11/2011  . Warfarin anticoagulation 09/22/2011  . Nonischemic cardiomyopathy ([place])   . Atrial fibrillation ([place])   . History of colon cancer   . Internal hemorrhoids   . Rheumatic heart disease   . Sick sinus syndrome ([place])   . Systolic CHF, chronic ([place])   . DIVERTICULAR DISEASE 06/05/2009     Orientation RESPIRATION BLADDER Height & Weight     Self, Place  Normal Indwelling catheter, Incontinent(urethal placed 11/28/18) Weight: 143 lb 11.8 oz (65.2 kg) Height:  5\' 6"  (167.6 cm)  BEHAVIORAL SYMPTOMS/MOOD NEUROLOGICAL BOWEL NUTRITION STATUS      Incontinent Diet(regular diet)  AMBULATORY STATUS COMMUNICATION OF NEEDS Skin   Extensive Assist Verbally Surgical wounds(Closed incision on buttocks, ABD;Moist to dry, change two times a day)                       Personal Care Assistance Level of Assistance  Dressing, Feeding, Bathing Bathing Assistance: Maximum assistance Feeding assistance: Limited assistance Dressing Assistance: Maximum assistance     Functional Limitations Info  Sight, Hearing, Speech Sight Info: Adequate Hearing Info: Adequate Speech Info: Adequate    SPECIAL CARE FACTORS FREQUENCY  PT (By licensed PT), OT (By licensed OT)     PT Frequency: 5x OT Frequency: 5x            Contractures Contractures Info: Not present    Additional Factors Info  Code Status, Allergies Code Status Info: Full Code Allergies Info: NO known allergies           Current Medications (12/02/2018):  This is the current hospital active medication list Current Facility-Administered Medications  Medication Dose Route Frequency Provider Last Rate Last Dose  . acetaminophen (TYLENOL) tablet 650 mg  650 mg Oral Q6H PRN Saverio Danker, PA-C   650 mg at 12/02/18 1516   Or  . acetaminophen (TYLENOL) suppository 650 mg  650 mg Rectal Q6H PRN Saverio Danker, PA-C      .  amiodarone (PACERONE) tablet 400 mg  400 mg Oral BID Evans Lance, MD   400 mg at 12/02/18 3545  . Chlorhexidine Gluconate Cloth 2 % PADS 6 each  6 each Topical Q0600 Florencia Reasons, MD   6 each at 12/02/18 [phone]  . feeding supplement (ENSURE ENLIVE) (ENSURE ENLIVE) liquid 237 mL  237 mL Oral TID BM Florencia Reasons, MD   237 mL at 12/02/18 0940  . feeding supplement (PRO-STAT SUGAR FREE 64) liquid 30 mL  30 mL Oral  BID Florencia Reasons, MD   30 mL at 12/01/18 2146  . heparin ADULT infusion 100 units/mL (25000 units/210mL sodium chloride 0.45%)  1,100 Units/hr Intravenous Continuous Roney Jaffe, MD 11 mL/hr at 12/02/18 1200 1,100 Units/hr at 12/02/18 1200  . insulin aspart (novoLOG) injection 0-9 Units  0-9 Units Subcutaneous Q4H Saverio Danker, PA-C   1 Units at 11/30/18 2059  . lip balm (CARMEX) ointment   Topical PRN Florencia Reasons, MD      . morphine 2 MG/ML injection 1-2 mg  1-2 mg Intravenous Q2H PRN Greer Pickerel, MD   1 mg at 12/02/18 0835  . multivitamin with minerals tablet 1 tablet  1 tablet Oral Daily Florencia Reasons, MD   1 tablet at 12/02/18 0944  . ondansetron (ZOFRAN) injection 4 mg  4 mg Intravenous Q6H PRN Florencia Reasons, MD   4 mg at 12/02/18 0944  . oxyCODONE (Oxy IR/ROXICODONE) immediate release tablet 5-10 mg  5-10 mg Oral Q4H PRN Greer Pickerel, MD   10 mg at 12/02/18 1516  . piperacillin-tazobactam (ZOSYN) IVPB 3.375 g  3.375 g Intravenous Q8H Roney Jaffe, MD 12.5 mL/hr at 12/02/18 1513 3.375 g at 12/02/18 1513  . sodium chloride flush (NS) 0.9 % injection 3 mL  3 mL Intravenous Q12H Saverio Danker, PA-C   3 mL at 12/02/18 0940  . warfarin (COUMADIN) tablet 0.5 mg  0.5 mg Oral ONCE-1800 Roney Jaffe, MD      . Warfarin - Pharmacist Dosing Inpatient   Does not apply [id number] Florencia Reasons, MD         Discharge Medications: Please see discharge summary for a list of discharge medications.  Relevant Imaging Results:  Relevant Lab Results:   Additional Information SSN: [id number]  Eileen Stanford, LCSW

## 2018-12-02 NOTE — Progress Notes (Signed)
  Speech Language Pathology Treatment: Dysphagia  Patient Details Name: Angela [name] MRN: [id number] DOB: [birth year] Today's Date: 12/02/2018 Time: 6060-0459 SLP Time Calculation (min) (ACUTE ONLY): 10 min  Assessment / Plan / Recommendation Clinical Impression  Pt seen at bedside to assess tolerance of regular diet with thin liquids. Per RN, Pt is not eating much, but drinking some Ensure. Pt said, "I don't want anything to eat or drink". Pt demonstrated very low vocal intensity. With prompting, she took several sips of Ensure via straw and accepted one bite of graham cracker. Pt grimaced when swallowing, but denied pain. Question reliability; potential etiology of odynophagia. Immediate cough 1x after sipping Ensure and delayed cough 1x as SLP was leaving the room. Overall assessment limited due to Pt's poor intake. Pt at risk for inadequate nutrition and hydration due to minimal intake. Continue with current diet.Will follow up to assess diet tolerance.   HPI HPI: 73 year old female who presented to the ED on 4/2 from surgical center after having a syncopal episode in the lobby associated with ICD shocks. Pt is s/p I&D of R perineal/buttock Fournier's gangrene on 4/4. Pt had MBS in 2009, but report is not available. Swallow evaluation ordered due to report of difficulty swallowing pills. PMH of CVA, CHF, NICM, s/p mechanical MVR on chronic coumadin, LBBB s/p biventricular ICD placement, atrial fibrillation, dementia, colon cancer      SLP Plan  Continue with current plan of care       Recommendations  Diet recommendations: Regular;Thin liquid Liquids provided via: Cup;Straw Medication Administration: Whole meds with liquid Supervision: Staff to assist with self feeding;Full supervision/cueing for compensatory strategies Compensations: Slow rate;Small sips/bites Postural Changes and/or Swallow Maneuvers: Seated upright 90 degrees                Oral Care Recommendations: Oral care  BID Follow up Recommendations: None SLP Visit Diagnosis: Dysphagia, unspecified (R13.10) Plan: Continue with current plan of care       GO                Angela [name] 12/02/2018, 1:03 PM  Melody Haver, M.Ed., [facility] [phone]

## 2018-12-02 NOTE — Progress Notes (Signed)
Progress Note  Patient Name: Angela [name] Date of Encounter: 12/02/2018  Primary Cardiologist: Cristopher Peru, MD   Subjective   Feeling a little better today.   Inpatient Medications    Scheduled Meds: . amiodarone  400 mg Oral BID  . bisacodyl  10 mg Rectal Daily  . Chlorhexidine Gluconate Cloth  6 each Topical Q0600  . feeding supplement (ENSURE ENLIVE)  237 mL Oral TID BM  . feeding supplement (PRO-STAT SUGAR FREE 64)  30 mL Oral BID  . insulin aspart  0-9 Units Subcutaneous Q4H  . multivitamin with minerals  1 tablet Oral Daily  . polyethylene glycol  17 g Oral Daily  . senna-docusate  1 tablet Oral BID  . sodium chloride flush  3 mL Intravenous Q12H  . warfarin  0.5 mg Oral ONCE-1800  . Warfarin - Pharmacist Dosing Inpatient   Does not apply q1800   Continuous Infusions: . heparin 1,100 Units/hr (12/02/18 0743)  . piperacillin-tazobactam (ZOSYN)  IV 3.375 g (12/02/18 0612)   PRN Meds: acetaminophen **OR** acetaminophen, lip balm, morphine injection, ondansetron (ZOFRAN) IV, oxyCODONE   Vital Signs    Vitals:   12/02/18 0500 12/02/18 0600 12/02/18 0700 12/02/18 0759  BP: 107/67 124/63 (!) 109/58   Pulse: 73 76 73   Resp: 19 20 20    Temp:    98.2 F (36.8 C)  TempSrc:    Oral  SpO2: 95% 95% 95%   Weight:      Height:        Intake/Output Summary (Last 24 hours) at 12/02/2018 0827 Last data filed at 12/02/2018 0700 Gross per 24 hour  Intake 358.28 ml  Output 1265 ml  Net -906.72 ml   Filed Weights   11/30/18 0411 12/01/18 0500 12/02/18 0418  Weight: 67.9 kg 65.3 kg 65.2 kg    Telemetry    Atrial fib with biv pacing - Personally Reviewed  ECG    none - Personally Reviewed  Physical Exam   GEN: No acute distress.   Neck: No JVD Cardiac: RRR, no murmurs, rubs, or gallops. Mechanical mitral closure sound Respiratory: Clear to auscultation bilaterally. GI: Soft, nontender, non-distended  MS: No edema; No deformity. Neuro:  Nonfocal  Psych:  Normal affect   Labs    Chemistry Recent Labs  Lab 11/26/18 1614  11/30/18 0113 12/01/18 0754 12/02/18 0248  NA 134*   < > 130* 135 132*  K 4.1   < > 4.3 4.7 5.1  CL 99   < > 102 102 103  CO2 20*   < > 20* 22 20*  GLUCOSE 181*   < > 124* 97 86  BUN 17   < > 9 10 10   CREATININE 0.97   < > 0.96 1.01* 1.03*  CALCIUM 8.8*   < > 8.1* 8.8* 8.7*  PROT 7.0  --   --   --   --   ALBUMIN 2.7*  --   --   --   --   AST 41  --   --   --   --   ALT 44  --   --   --   --   ALKPHOS 76  --   --   --   --   BILITOT 1.9*  --   --   --   --   GFRNONAA 58*   < > 58* 55* 54*  GFRAA >60   < > >60 >60 >60  ANIONGAP 15   < >  8 11 9    < > = values in this interval not displayed.     Hematology Recent Labs  Lab 11/30/18 0113 12/01/18 0754 12/02/18 0248  WBC 13.9* 10.7* 10.5  RBC 4.12 4.37 4.46  HGB 12.0 12.8 12.9  HCT 36.1 38.6 39.6  MCV 87.6 88.3 88.8  MCH 29.1 29.3 28.9  MCHC 33.2 33.2 32.6  RDW 15.3 15.3 15.2  PLT 274 278 292    Cardiac Enzymes Recent Labs  Lab 11/26/18 1614 11/26/18 2349 11/27/18 1440  TROPONINI 0.16* 0.40* 0.30*   No results for input(s): TROPIPOC in the last 168 hours.   BNPNo results for input(s): BNP, PROBNP in the last 168 hours.   DDimer No results for input(s): DDIMER in the last 168 hours.   Radiology    Dg Abd 1 View  Result Date: 11/30/2018 CLINICAL DATA:  Hypogastric pain and nausea. EXAM: ABDOMEN - 1 VIEW COMPARISON:  None. FINDINGS: Moderate amount of gas in the colon. The pattern does not suggest obstruction. Small bowel pattern is within normal limits. No significant soft tissue calcifications. Phleboliths in the pelvis. Curvature and degenerative change of the spine. 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 Electronically Signed   By: Nelson Chimes M.D.   On: 11/30/2018 19:44    Cardiac Studies   none  Patient Profile     73 y.o. female  admitted with per-rectal abscess and VF, s/p surgery with control of her arrhythmias.  Assessment & Plan    1. VT/VF - the patient has been stable on oral amiodarone. She will need to continue amiodarone 400 mg daily. We will plan to reduce her dose in about a month. 2. Chronic systolic heart failure - she is euvolemic. She will continue her current meds. 3. Coags - she is still on IV heparin and warfarin will be restarted.  4. Atrial fib - her rates are well controlled.  5. Infection - as per surgery service. Her pain appears reasonably well controlled.  [facility] will sign off.   Medication Recommendations:  Continue amio 400 mg daily and restart warfarin as per pharmacy service. Other recommendations (labs, testing, etc):  none Follow up as an outpatient:  Our office will call.  For questions or updates, please contact [place] Please consult [email or web address] for contact info under Cardiology/STEMI.      Signed, Cristopher Peru, MD  12/02/2018, 8:27 AM  Patient ID: Angela [name], female   DOB: 1946-03-02, 73 y.o.   MRN: [id number]

## 2018-12-03 DIAGNOSIS — F0390 Unspecified dementia without behavioral disturbance: Secondary | ICD-10-CM

## 2018-12-03 LAB — TYPE AND SCREEN
ABO/RH(D): A POS
Antibody Screen: NEGATIVE
Donor AG Type: NEGATIVE
Donor AG Type: NEGATIVE
Unit division: 0
Unit division: 0

## 2018-12-03 LAB — GLUCOSE, CAPILLARY
Glucose-Capillary: 100 mg/dL — ABNORMAL HIGH (ref 70–99)
Glucose-Capillary: 124 mg/dL — ABNORMAL HIGH (ref 70–99)
Glucose-Capillary: 77 mg/dL (ref 70–99)
Glucose-Capillary: 83 mg/dL (ref 70–99)
Glucose-Capillary: 87 mg/dL (ref 70–99)
Glucose-Capillary: 90 mg/dL (ref 70–99)
Glucose-Capillary: 98 mg/dL (ref 70–99)

## 2018-12-03 LAB — CBC
HCT: 35.9 % — ABNORMAL LOW (ref 36.0–46.0)
Hemoglobin: 11.3 g/dL — ABNORMAL LOW (ref 12.0–15.0)
MCH: 27.7 pg (ref 26.0–34.0)
MCHC: 31.5 g/dL (ref 30.0–36.0)
MCV: 88 fL (ref 80.0–100.0)
Platelets: 270 10*3/uL (ref 150–400)
RBC: 4.08 MIL/uL (ref 3.87–5.11)
RDW: 15 % (ref 11.5–15.5)
WBC: 8.1 10*3/uL (ref 4.0–10.5)
nRBC: 0 % (ref 0.0–0.2)

## 2018-12-03 LAB — BPAM RBC
Blood Product Expiration Date: 202004182359
Blood Product Expiration Date: 202004182359
Unit Type and Rh: 6200
Unit Type and Rh: 6200

## 2018-12-03 LAB — HEPARIN LEVEL (UNFRACTIONATED): Heparin Unfractionated: 0.54 IU/mL (ref 0.30–0.70)

## 2018-12-03 LAB — PROTIME-INR
INR: 2.7 — ABNORMAL HIGH (ref 0.8–1.2)
Prothrombin Time: 28.1 seconds — ABNORMAL HIGH (ref 11.4–15.2)

## 2018-12-03 MED ORDER — SODIUM CHLORIDE 0.9 % IV SOLN
3.0000 g | Freq: Four times a day (QID) | INTRAVENOUS | Status: DC
  Administered 2018-12-03 – 2018-12-05 (×9): 3 g via INTRAVENOUS
  Filled 2018-12-03 (×12): qty 3

## 2018-12-03 MED ORDER — WARFARIN 0.5 MG HALF TABLET
0.5000 mg | ORAL_TABLET | Freq: Once | ORAL | Status: AC
  Administered 2018-12-03: 19:00:00 0.5 mg via ORAL
  Filled 2018-12-03: qty 1

## 2018-12-03 MED ORDER — SACCHAROMYCES BOULARDII 250 MG PO CAPS
250.0000 mg | ORAL_CAPSULE | Freq: Two times a day (BID) | ORAL | Status: DC
  Administered 2018-12-03 – 2018-12-08 (×12): 250 mg via ORAL
  Filled 2018-12-03 (×13): qty 1

## 2018-12-03 MED ORDER — FLUCONAZOLE IN SODIUM CHLORIDE 200-0.9 MG/100ML-% IV SOLN
200.0000 mg | Freq: Every day | INTRAVENOUS | Status: DC
  Administered 2018-12-03 – 2018-12-06 (×4): 200 mg via INTRAVENOUS
  Filled 2018-12-03 (×4): qty 100

## 2018-12-03 MED ORDER — PSYLLIUM 95 % PO PACK
1.0000 | PACK | Freq: Every day | ORAL | Status: DC
  Administered 2018-12-03 – 2018-12-04 (×2): 1 via ORAL
  Filled 2018-12-03 (×2): qty 1

## 2018-12-03 NOTE — Progress Notes (Signed)
Patient had 16 beat run of vtach at 433. Dr. Jonnie Finner notified via [facility] system. Patient asymptomatic in bed sleeping.   Emelda Fear, RN

## 2018-12-03 NOTE — Progress Notes (Signed)
  Speech Language Pathology Treatment: Dysphagia  Patient Details Name: Angela [name] MRN: [id number] DOB: 28-Nov-1945 Today's Date: 12/03/2018 Time: 0735-4301 SLP Time Calculation (min) (ACUTE ONLY): 10 min  Assessment / Plan / Recommendation Clinical Impression  Pt seen at beside to assess tolerance of regular diet and thin liquids. Pt stated that she is not hungry and said "Please don't force me to eat." With prompting, she accepted several sips of grape juice via straw, 2 bites of peaches, and 1 bite of graham cracker. Pt demonstrated prolonged mastication, but no overt signs or symptoms of aspiration were observed. Due to minimal oral intake, Pt is at risk for inadequate nutrition and hydration; may need to consider alternative means of nutrition. Recommend Pt be closely followed by RD. SLP will sign off at this time.    HPI HPI: 73 year old female who presented to the ED on 4/2 from surgical center after having a syncopal episode in the lobby associated with ICD shocks. Pt is s/p I&D of R perineal/buttock Fournier's gangrene on 4/4. Pt had MBS in 2009, but report is not available. Swallow evaluation ordered due to report of difficulty swallowing pills. PMH of CVA, CHF, NICM, s/p mechanical MVR on chronic coumadin, LBBB s/p biventricular ICD placement, atrial fibrillation, dementia, colon cancer      SLP Plan  All goals met       Recommendations  Diet recommendations: Regular;Thin liquid Liquids provided via: Cup;Straw Medication Administration: Whole meds with liquid Supervision: Staff to assist with self feeding;Full supervision/cueing for compensatory strategies Compensations: Slow rate;Small sips/bites Postural Changes and/or Swallow Maneuvers: Seated upright 90 degrees                Oral Care Recommendations: Oral care BID Follow up Recommendations: None SLP Visit Diagnosis: Dysphagia, unspecified (R13.10) Plan: All goals met       GO                Laurey Arrow 12/03/2018, 12:50 PM  Melody Haver, M.Ed., [facility] [phone]

## 2018-12-03 NOTE — Care Management Important Message (Signed)
Important Message  Patient Details  Name: Angela [name] MRN: [id number] Date of Birth: 02-25-1946   Medicare Important Message Given:  Yes  Patient was not able to sign.  Unsigned copy left at bedside.   [name] 12/03/2018, 3:48 PM

## 2018-12-03 NOTE — Progress Notes (Addendum)
ANTICOAGULATION CONSULT NOTE  Pharmacy Consult for heparin Indication: AFib, mechanical MVR, hx DVT  No Known Allergies  Patient Measurements: Height: 5\' 6"  (167.6 cm) Weight: 137 lb 9.1 oz (62.4 kg) IBW/kg (Calculated) : 59.3 Heparin Dosing Weight: 60kg  Vital Signs: Temp: 98.3 F (36.8 C) (04/09 0838) Temp Source: Oral (04/09 0838) BP: 94/62 (04/09 0838) Pulse Rate: 76 (04/09 0838)  Labs: Recent Labs    12/01/18 0754 12/02/18 0248 12/03/18 0343  HGB 12.8 12.9 11.3*  HCT 38.6 39.6 35.9*  PLT 278 292 270  LABPROT 22.3* 24.8* 28.1*  INR 2.0* 2.3* 2.7*  HEPARINUNFRC 0.49 0.26* 0.54  CREATININE 1.01* 1.03*  --     Estimated Creatinine Clearance: 44.9 mL/min (A) (by C-G formula based on SCr of 1.03 mg/dL (H)).   Assessment: 33 yoF on warfarin PTA for PAF, hx DVT, and mechanical MVR. Pt admitted with supratherapeutic INR of 9.1 s/p FFP and vitamin K due to urgent need for I&D on 4/4. Surgery okayed resuming heparin infusion - will avoid boluses and target low goal given recent surgery. -heparin level= 0.54, INR= 2.7 (trend up)  Home coumadin dose: 8mg /day  She is also noted with right perineal/perirectal abscesson zosyn. Per surgery may narrow antibiotics and may use po therapy. Changed to Augmentin 4/8 but it appears patient was not able to swallow.  Goal of Therapy:  Goal INR= 2.5-2.5 Heparin level 0.3-0.5 units/ml Monitor platelets by anticoagulation protocol: Yes   Plan:  -Continue heparin at 1100 units/hr. Consider discontinuing today -Daily CBC, Heparin Level -Coumadin 0.5mg  today -Daily PT/INR -Consider augmentin when able (could also try as a suspension)  Hildred Laser, PharmD Clinical Pharmacist **Pharmacist phone directory can now be found on [email or web address] ([email or web address]).  Listed under [place].

## 2018-12-03 NOTE — Progress Notes (Signed)
Nutrition Follow-up  DOCUMENTATION CODES:   Not applicable  INTERVENTION:   If within [place] recommend Cortrak placement with initiation of nutrition support (service tomorrow 8am-4pm).   Until meeting with PMT:  Continue 30 ml Prostat BID, each supplement provides 100 kcals and 15 grams protein.   Continue Ensure Enlive po TID, each supplement provides 350 kcal and 20 grams of protein  Continue MVI with minerals  If TF desired: Jevity 1.2  @ 20 ml/hr via Cortrak  Increase by 10 ml Q8 hours to goal rate of 55 ml/hr (1320 ml) 30 ml Prostat BID  At goal TF provides: 1784 kcals, 103 grams protein, 1065 ml free water. Meets 100% of needs.   NUTRITION DIAGNOSIS:   Inadequate oral intake related to lethargy/confusion, acute illness as evidenced by meal completion < 25%.  Ongoing  GOAL:   Patient will meet greater than or equal to 90% of their needs  Not meeting  MONITOR:   PO intake, Supplement acceptance, Weight trends, Skin, Labs  REASON FOR ASSESSMENT:   Consult Assessment of nutrition requirement/status  ASSESSMENT:   73 year old female who presented to the ED on 4/2 from surgical center after having a syncopal episode in the lobby associated with ICD shocks. 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 Surgery consulted for perineal abscess.   4/4 - s/p I&D of right perineal/buttock Fournier's gangrene  RD working remotely.  Consulted for assessment. Clinical nutrition has been following pt since 4/6. Unable to reach pt by phone.   Spoke with RN who reports pt ate a couple bites of pudding for breakfast and had a couple sips of Prostat in apple juice this afternoon. Meal completions charted as 0-5% for the pt's last 8 meals. RN spoke with daughter who reports pt typically doesn't eat much as is. Do not suspect intake will progress in the near future. Unable to perform calorie  count with such minimal intakes.   Recommend initiation of nutrition support if within [place] given wound status and likely malnourished state.   Weight noted to decrease from 67.9 kg on 4/6 to 62.4 kg today.   Medications reviewed and include: SS novolog, MVI with minerals, metamucil Labs reviewed: CBG 77-124 Na 132 (L)  Diet Order:   Diet Order            Diet regular Room service appropriate? Yes; Fluid consistency: Thin  Diet effective now              EDUCATION NEEDS:   Not appropriate for education at this time  Skin:  Skin Assessment: Skin Integrity Issues: Skin Integrity Issues:: Other (Comment) Other: Fournier's gangrene to right perineal/buttocks s/p I&D  Last BM:  4/9   Height:   Ht Readings from Last 1 Encounters:  11/26/18 5\' 6"  (1.676 m)    Weight:   Wt Readings from Last 1 Encounters:  12/03/18 62.4 kg    Ideal Body Weight:  59.1 kg  BMI:  Body mass index is 22.2 kg/m.  Estimated Nutritional Needs:   Kcal:  1500-1700  Protein:  85-100 grams  Fluid:  1.5-1.7 L  Angela [name] RD, LDN Clinical Nutrition Pager # [phone]

## 2018-12-03 NOTE — Progress Notes (Signed)
Flexiseal paced at this time per MD order. Patient tolerated well. Dressing cleaned and reinforced at this time

## 2018-12-03 NOTE — Progress Notes (Signed)
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  She was at the surgeon's office for evaluation of a perirectal infection, CT noted showed multiple foci of gas. She had a syncopal episode with icd shock at the surgical center, she is sent to the ER. Cardiology consulted, she is started on amiodarone drip She is found to have elevated INR, she received vit k and FFP on admission per general surgery recommendation. Subjective: patient had an uneventful night,  on room air at rest, no fever. She remains on heparin drip. She knows is in hospital, doesn't know why she is here, what she came in for, what has been done, the date/ year or month.  She knows [place]. Per RN patient has poor appetite, does not eat much at all.    Assessment/Plan: Principal Problem:   Syncope Active Problems:   Systolic CHF, chronic (HCC)   Ventricular fibrillation (HCC)   S/P Mechanical MVR (mitral valve replacement)   ICD (implantable cardioverter-defibrillator), biventricular, in situ   Elevated troponin   Ventricular tachycardia (HCC)   Elevated INR   Perineal abscess   Perirectal abscess   Fournier's gangrene in female  Right perineal/perirectal abscess with presence of gas/Fournier's gangrene - Incision, drainage & Excisional DEBRIDEMENT with scalpel of right Perineal/buttock Fournier's gangrene; pulsatile lavage on 4/4 -general surgery oked to resume heparin drip on 4/5  -blood cultures no growth, mrsa screening negative, wound culture preliminary  result with pansensitive ecoli, rare candida and mixed anaerobic flora -wound care per  general surgery , may need hydrotherapy per general surgery - started vanc/ zosyn here, d/w ID on call, recommend 2 wks total abx through 4/18, ok to switch to unasyn (augmentin when eating) and add diflucan 200 /d also through 4/18  Syncope/Ventricular tachyarrhythmia with ventricular fibrillation s/p ICD shock x5: - was transferred to cardiac ICU due to recurrent vfib while on  IV amiodarone drip, she was started on lidocaine drip - is better, now is off of the lidocaine drip and amiodarone drip. She was started on oral amiodarone - Cardiology /EP consulted and signed off  Afib, SSS s/p PPM, s/p mechanical MVR on coumadin, with supratherapeutic INR on presentation - INR reversed for surgery, patient restarted back to heparin drip on 4/5 per general surgery recommendation  - General surgery ok'd to Resume coumadin,  Coumadin resumed on 4/7, continue heparin drip until INR at goal per pharm  Nonischemic cardiomyopathy/ EF 15% / sp biVICD -Recent hospitalization for heart failure from 3/2 to 3/4 -currently euvolemic to dry -close monitor volume status, cardiology has signed off  Dementia/FTT -she had h/o colon cancer, she has h/o gi bleed, palliative care involved in 04/2017, family was not receptive to palliative care at the time per chart review - regarding EOL issues I spoke w/ pt's husband and son again today 4/9 and encouraged them to think about how aggressive of care we should be doing, they agree to meet with palliative care and discuss things like life support, feeding tubes, SNF placement, pain control, etc - keep in mind pt has ICD in case DNR is ordered.  GI/ nutrition/ BM - patient not eating much.  No bm for several days > KUB obtained on 4/6 unremarkable - started on stool regimen by GSurg then started to have diarrhea so these meds were dc'd today and psyllium was started - added florastor today - need calorie counts , will consult nutrition / dietician  Code Status: full  Family Communication: son and husband Disposition: will likely need SNF placement, await pall care meeting for [place] Status: inpatient/ surg tele, may be able to go to med surg soon   Kelly Splinter / [facility] [phone] 12/03/2018, 11:59 AM    Consultants:  Cardiology/EP  General surgery  Procedures:  Incision, drainage & Excisional DEBRIDEMENT with scalpel of right Perineal/buttock Fournier's gangrene; pulsatile lavage on 4/4  Antibiotics:  Vanc from admission to 4/6   zosyn from admission > 4/9  unasyn from 4/9 >  Diflucan from 4/9 >    Objective: BP 94/62   Pulse 76   Temp 98.3 F (36.8 C) (Oral)   Resp 16   Ht 5\' 6"  (1.676 m)   Wt 62.4 kg   SpO2 96%   BMI 22.20 kg/m   Intake/Output Summary (Last 24 hours) at 12/03/2018 1159 Last data filed at 12/03/2018 0439 Gross per 24 hour  Intake 895.96 ml  Output -  Net 895.96 ml   Filed Weights   12/01/18 0500 12/02/18 0418 12/03/18 0353  Weight: 65.3 kg 65.2 kg 62.4 kg    Exam: Patient is examined daily including today on 12/03/2018, exams remain the same as of yesterday except that has changed    General:  Chronically ill, weak, but fully awake, answers simple questions  Cardiovascular: paced rhythm  Respiratory: diminished, no wheezing, no rales, no rhonchi  Abdomen: post op changes (perioneal/buttock) , positive BS  Musculoskeletal: No Edema  Neuro: awake, answers simple questions   Data Reviewed: Basic Metabolic Panel: Recent Labs  Lab 11/26/18 1614 11/27/18 1440 11/27/18 2123 11/28/18 0152 11/29/18 0328 11/30/18 0113 12/01/18 0754 12/02/18 0248  NA 134* 138 134* 135 132* 130* 135 132*  K 4.1 3.0* 3.2* 3.5 4.0 4.3 4.7 5.1  CL 99 106 100 104 102 102 102 103  CO2 20* 23 22 22 22  20* 22 20*  GLUCOSE 181* 93 115* 102* 112* 124* 97 86  BUN 17 8 9 9 8 9 10 10   CREATININE 0.97 0.70 0.76 0.72 0.82 0.96 1.01* 1.03*  CALCIUM 8.8* 7.8* 8.0* 8.1* 8.0* 8.1* 8.8* 8.7*  MG 1.8 1.9 2.1  --  1.9  --  1.9  --     Liver Function Tests: Recent Labs  Lab 11/26/18 1614  AST 41  ALT 44  ALKPHOS 76  BILITOT 1.9*  PROT 7.0  ALBUMIN 2.7*   No results for input(s): LIPASE, AMYLASE in the last 168 hours. No results for input(s): AMMONIA in the last 168 hours. CBC: Recent Labs  Lab 11/26/18 1614 11/27/18 1440  11/29/18 0328 11/30/18 0113 12/01/18 0754 12/02/18 0248 12/03/18 0343  WBC 20.2* 18.4*   < > 15.2* 13.9* 10.7* 10.5 8.1  NEUTROABS 18.5* 16.9*  --   --   --  9.2*  --   --   HGB 15.3* 11.5*   < > 11.2* 12.0 12.8 12.9 11.3*  HCT 47.5* 35.6*   < > 33.8* 36.1 38.6 39.6 35.9*  MCV 88.3 88.1   < > 88.3 87.6 88.3 88.8 88.0  PLT 299 240   < > 255 274 278 292 270   < > = values in this interval not displayed.  Cardiac Enzymes:   Recent Labs  Lab 11/26/18 1614 11/26/18 2349 11/27/18 1440  TROPONINI 0.16* 0.40* 0.30*   BNP (last 3 results) Recent Labs    10/26/18 1855  BNP 1,092.7*    ProBNP (last 3 results) No results for input(s): PROBNP in the last 8760 hours.  CBG: Recent Labs  Lab 12/02/18 1606 12/02/18 2013 12/03/18 0011 12/03/18 0429 12/03/18 0828  GLUCAP 90 83 98 87 77    Recent Results (from the past 240 hour(s))  Culture, blood (Routine X 2) w Reflex to ID Panel     Status: None   Collection Time: 11/26/18 11:45 PM  Result Value Ref Range Status   Specimen Description BLOOD RIGHT HAND  Final   Special Requests   Final    BOTTLES DRAWN AEROBIC ONLY Blood Culture adequate volume   Culture   Final    NO GROWTH 5 DAYS Performed at [facility], [place] [address] [place], [place] [address]    Report Status 12/02/2018 FINAL  Final  Culture, blood (Routine X 2) w Reflex to ID Panel     Status: None   Collection Time: 11/26/18 11:59 PM  Result Value Ref Range Status   Specimen Description BLOOD LEFT HAND  Final   Special Requests   Final    BOTTLES DRAWN AEROBIC ONLY Blood Culture adequate volume   Culture   Final    NO GROWTH 5 DAYS Performed at  [facility], [place] [address] [place], [place] [address]    Report Status 12/02/2018 FINAL  Final  MRSA PCR Screening     Status: None   Collection Time: 11/27/18 12:41 AM  Result Value Ref Range Status   MRSA by PCR NEGATIVE NEGATIVE Final    Comment:        The GeneXpert MRSA Assay (FDA approved for NASAL specimens only), is one component of a comprehensive MRSA colonization surveillance program. It is not intended to diagnose MRSA infection nor to guide or monitor treatment for MRSA infections. Performed at [facility], [place] [address], [place],  [address]   Aerobic/Anaerobic Culture (surgical/deep wound)     Status: None   Collection Time: 11/28/18 10:32 AM  Result Value Ref Range Status   Specimen Description ABSCESS BUTTOCKS  Final   Special Requests PATIENT ON FOLLOWING VANC AND ZOSYN  Final   Gram Stain   Final    NO WBC SEEN RARE GRAM POSITIVE COCCI IN PAIRS RARE GRAM POSITIVE RODS Performed at [facility], [address], [place],  [address]    Culture   Final    RARE ESCHERICHIA COLI RARE CANDIDA ALBICANS MIXED ANAEROBIC FLORA PRESENT.  CALL LAB IF FURTHER IID REQUIRED.    Report Status 12/02/2018 FINAL  Final   Organism ID, Bacteria ESCHERICHIA COLI  Final      Susceptibility   Escherichia coli - MIC*    AMPICILLIN <=2 SENSITIVE Sensitive     CEFAZOLIN <=4 SENSITIVE Sensitive     CEFEPIME <=1 SENSITIVE Sensitive     CEFTAZIDIME <=1 SENSITIVE Sensitive     CEFTRIAXONE <=1 SENSITIVE Sensitive     CIPROFLOXACIN <=0.25 SENSITIVE Sensitive     GENTAMICIN <=1 SENSITIVE Sensitive     IMIPENEM <=0.25 SENSITIVE Sensitive     TRIMETH/SULFA <=20 SENSITIVE Sensitive     AMPICILLIN/SULBACTAM <=2 SENSITIVE Sensitive     PIP/TAZO <=4 SENSITIVE Sensitive     Extended ESBL NEGATIVE Sensitive     *  RARE ESCHERICHIA COLI     Studies: No results found.  Scheduled Meds: . amiodarone  400 mg Oral BID  . Chlorhexidine Gluconate Cloth  6  each Topical Q0600  . feeding supplement (ENSURE ENLIVE)  237 mL Oral TID BM  . feeding supplement (PRO-STAT SUGAR FREE 64)  30 mL Oral BID  . insulin aspart  0-9 Units Subcutaneous Q4H  . multivitamin with minerals  1 tablet Oral Daily  . psyllium  1 packet Oral Daily  . saccharomyces boulardii  250 mg Oral BID  . sodium chloride flush  3 mL Intravenous Q12H  . Warfarin - Pharmacist Dosing Inpatient   Does not apply q1800    Continuous Infusions: . heparin 1,100 Units/hr (12/03/18 0524)  . piperacillin-tazobactam (ZOSYN)  IV 3.375 g (12/03/18 0520)      [facility] Pager [phone]. If 7PM-7AM, please contact night-coverage at [email or web address], password [facility] 12/03/2018, 11:59 AM  LOS: 7 days

## 2018-12-03 NOTE — Progress Notes (Signed)
Pharmacy Antibiotic Note  Angela [name] is a 73 y.o. female with right perineal/perirectal abscess on zosyn to change to unasyn (plans for augmentin once eating well). Antibiotics planned until 4/18 (also on diflucan)  Plan: -Unasyn 3gm IV q8h -Will follow renal function and clinical progress   Height: 5\' 6"  (167.6 cm) Weight: 137 lb 9.1 oz (62.4 kg) IBW/kg (Calculated) : 59.3  Temp (24hrs), Avg:97.9 F (36.6 C), Min:97.3 F (36.3 C), Max:98.3 F (36.8 C)  Recent Labs  Lab 11/28/18 0152 11/29/18 0328 11/30/18 0113 12/01/18 0754 12/02/18 0248 12/03/18 0343  WBC 16.8* 15.2* 13.9* 10.7* 10.5 8.1  CREATININE 0.72 0.82 0.96 1.01* 1.03*  --   VANCOPEAK  --   --  28*  --   --   --     Estimated Creatinine Clearance: 44.9 mL/min (A) (by C-G formula based on SCr of 1.03 mg/dL (H)).    No Known Allergies   Thank you for allowing pharmacy to be a part of this patient's care.  Hildred Laser, PharmD Clinical Pharmacist **Pharmacist phone directory can now be found on [email or web address] ([email or web address]).  Listed under [place].

## 2018-12-03 NOTE — Plan of Care (Signed)

## 2018-12-03 NOTE — TOC Progression Note (Signed)
Transition of Care [facility]) - Progression Note    Patient Details  Name: Angela [name] MRN: [id number] Date of Birth: 12/30/1945  Transition of Care [facility]) CM/SW [place], [place] Phone Number: [phone] 12/03/2018, 8:59 AM  Clinical Narrative:     CSW acknowledges consult for SNF placement. Will follow for therapy recommendations.          Expected Discharge Plan and Services                                     Social Determinants of Health (SDOH) Interventions    Readmission Risk Interventions No flowsheet data found.

## 2018-12-03 NOTE — Progress Notes (Addendum)
[facility] Progress Note  5 Days Post-Op  Subjective: CC: wound Patient having pain with dressing change but overall tolerated well. Still having liquid stool, despite discontinuing laxatives.   Objective: Vital signs in last 24 hours: Temp:  [97.3 F (36.3 C)-98.3 F (36.8 C)] 98.3 F (36.8 C) (04/09 0838) Pulse Rate:  [68-76] 76 (04/09 0838) Resp:  [9-20] 16 (04/09 0838) BP: (89-116)/(57-76) 94/62 (04/09 0838) SpO2:  [95 %-100 %] 96 % (04/09 0838) Weight:  [62.4 kg] 62.4 kg (04/09 0353) Last BM Date: 12/03/18  Intake/Output from previous day: 04/08 0701 - 04/09 0700 In: 1059.6 [P.O.:240; I.V.:219.7; IV Piggyback:99.9] Out: -  Intake/Output this shift: No intake/output data recorded.  PE: Gen:  Alert, NAD, pleasant Card:  Regular rate and rhythm Pulm:  Normal effort Abd: Soft, non-tender, non-distended GU: foley present, wound as below     Lab Results:  Recent Labs    12/02/18 0248 12/03/18 0343  WBC 10.5 8.1  HGB 12.9 11.3*  HCT 39.6 35.9*  PLT 292 270   BMET Recent Labs    12/01/18 0754 12/02/18 0248  NA 135 132*  K 4.7 5.1  CL 102 103  CO2 22 20*  GLUCOSE 97 86  BUN 10 10  CREATININE 1.01* 1.03*  CALCIUM 8.8* 8.7*   PT/INR Recent Labs    12/02/18 0248 12/03/18 0343  LABPROT 24.8* 28.1*  INR 2.3* 2.7*   CMP     Component Value Date/Time   NA 132 (L) 12/02/2018 0248   K 5.1 12/02/2018 0248   CL 103 12/02/2018 0248   CO2 20 (L) 12/02/2018 0248   GLUCOSE 86 12/02/2018 0248   BUN 10 12/02/2018 0248   CREATININE 1.03 (H) 12/02/2018 0248   CALCIUM 8.7 (L) 12/02/2018 0248   PROT 7.0 11/26/2018 1614   ALBUMIN 2.7 (L) 11/26/2018 1614   AST 41 11/26/2018 1614   ALT 44 11/26/2018 1614   ALKPHOS 76 11/26/2018 1614   BILITOT 1.9 (H) 11/26/2018 1614   GFRNONAA 54 (L) 12/02/2018 0248   GFRAA >60 12/02/2018 0248   Lipase  No results found for: LIPASE     Studies/Results: No results found.  Anti-infectives:  Anti-infectives (From admission, onward)   Start     Dose/Rate Route Frequency Ordered Stop   12/02/18 1415  piperacillin-tazobactam (ZOSYN) IVPB 3.375 g     3.375 g 12.5 mL/hr over 240 Minutes Intravenous Every 8 hours 12/02/18 1413     12/02/18 1200  amoxicillin-clavulanate (AUGMENTIN) 875-125 MG per tablet 1 tablet  Status:  Discontinued     1 tablet Oral Every 12 hours 12/02/18 0904 12/02/18 1413   11/27/18 2300  vancomycin (VANCOCIN) 1,000 mg in sodium chloride 0.9 % 250 mL IVPB  Status:  Discontinued     1,000 mg 250 mL/hr over 60 Minutes Intravenous Every 24 hours 11/26/18 2336 11/30/18 2141   11/27/18 0600  piperacillin-tazobactam (ZOSYN) IVPB 3.375 g  Status:  Discontinued     3.375 g 12.5 mL/hr over 240 Minutes Intravenous Every 8 hours 11/26/18 2336 12/02/18 0904   11/26/18 2330  vancomycin (VANCOCIN) 1,250 mg in sodium chloride 0.9 % 250 mL IVPB     1,250 mg 166.7 mL/hr over 90 Minutes Intravenous STAT 11/26/18 2324 11/27/18 0345   11/26/18 2230  piperacillin-tazobactam (ZOSYN) IVPB 3.375 g     3.375 g 100 mL/hr over 30 Minutes Intravenous  Once 11/26/18 2218 11/26/18 2302       Assessment/Plan V-tach with V fib, s/o ICD shock  x4  Nonischemic cardiomyopathy with EF of 15% Atrial fibrillation  Peripheral vascular disease Hx of CVA Hx of colon cancer Dementia/FTT  Right buttock/perineal abscess -s/p I&D 11/28/18 Dr. Redmond Pulling - wound cleaning up, add fiber and flexiseal to control stool  - continue BID dressing changes - cxs grew pan-sensitive E. Coli - continue IV abx while patient still having some loose stools  FEN -reg diet VTE -SCDs, heparin gtt ID -Vanc4/2> 4/6; Zosyn 4/2>>  Will follow up next week   LOS: 7 days    Brigid Re , [facility] 12/03/2018, 9:04 AM Pager: [place]: [phone]

## 2018-12-04 DIAGNOSIS — R197 Diarrhea, unspecified: Secondary | ICD-10-CM

## 2018-12-04 LAB — CBC
HCT: 36.1 % (ref 36.0–46.0)
Hemoglobin: 11.2 g/dL — ABNORMAL LOW (ref 12.0–15.0)
MCH: 28.1 pg (ref 26.0–34.0)
MCHC: 31 g/dL (ref 30.0–36.0)
MCV: 90.7 fL (ref 80.0–100.0)
Platelets: 278 10*3/uL (ref 150–400)
RBC: 3.98 MIL/uL (ref 3.87–5.11)
RDW: 15.4 % (ref 11.5–15.5)
WBC: 6.3 10*3/uL (ref 4.0–10.5)
nRBC: 0 % (ref 0.0–0.2)

## 2018-12-04 LAB — GLUCOSE, CAPILLARY
Glucose-Capillary: 103 mg/dL — ABNORMAL HIGH (ref 70–99)
Glucose-Capillary: 106 mg/dL — ABNORMAL HIGH (ref 70–99)
Glucose-Capillary: 110 mg/dL — ABNORMAL HIGH (ref 70–99)
Glucose-Capillary: 122 mg/dL — ABNORMAL HIGH (ref 70–99)
Glucose-Capillary: 90 mg/dL (ref 70–99)
Glucose-Capillary: 96 mg/dL (ref 70–99)

## 2018-12-04 LAB — BASIC METABOLIC PANEL
Anion gap: 10 (ref 5–15)
BUN: 9 mg/dL (ref 8–23)
CO2: 25 mmol/L (ref 22–32)
Calcium: 8.6 mg/dL — ABNORMAL LOW (ref 8.9–10.3)
Chloride: 98 mmol/L (ref 98–111)
Creatinine, Ser: 0.86 mg/dL (ref 0.44–1.00)
GFR calc Af Amer: >60 mL/min (ref >60)
GFR calc non Af Amer: >60 mL/min (ref >60)
Glucose, Bld: 102 mg/dL — ABNORMAL HIGH (ref 70–99)
Potassium: 4.1 mmol/L (ref 3.5–5.1)
Sodium: 133 mmol/L — ABNORMAL LOW (ref 135–145)

## 2018-12-04 LAB — PROTIME-INR
INR: 2.4 — ABNORMAL HIGH (ref 0.8–1.2)
Prothrombin Time: 26.1 seconds — ABNORMAL HIGH (ref 11.4–15.2)

## 2018-12-04 MED ORDER — WARFARIN SODIUM 1 MG PO TABS
1.0000 mg | ORAL_TABLET | Freq: Once | ORAL | Status: AC
  Administered 2018-12-04: 1 mg via ORAL
  Filled 2018-12-04: qty 1

## 2018-12-04 NOTE — Progress Notes (Addendum)
ANTICOAGULATION CONSULT NOTE  Pharmacy Consult for coumadin Indication: AFib, mechanical MVR, hx DVT  No Known Allergies  Patient Measurements: Height: 5\' 6"  (167.6 cm) Weight: 139 lb 5.3 oz (63.2 kg) IBW/kg (Calculated) : 59.3 Heparin Dosing Weight: 60kg  Vital Signs: Temp: 97.5 F (36.4 C) (04/10 0419) Temp Source: Oral (04/10 0419) BP: 114/72 (04/10 0419) Pulse Rate: 74 (04/10 0419)  Labs: Recent Labs    12/02/18 0248 12/03/18 0343 12/04/18 0513  HGB 12.9 11.3* 11.2*  HCT 39.6 35.9* 36.1  PLT 292 270 278  LABPROT 24.8* 28.1* 26.1*  INR 2.3* 2.7* 2.4*  HEPARINUNFRC 0.26* 0.54  --   CREATININE 1.03*  --  0.86    Estimated Creatinine Clearance: 53.7 mL/min (by C-G formula based on SCr of 0.86 mg/dL).   Assessment: 57 yoF on warfarin PTA for PAF, hx DVT, and mechanical MVR. Pt admitted with supratherapeutic INR of 9.1 s/p FFP and vitamin K due to urgent need for I&D on 4/4. SInce 4/7 when coumadin restarted she has received 0.5 -1mg /day) - INR= 2.4   Home coumadin dose: 8mg /day   Goal of Therapy:  Goal INR= 2.5-2.5 Monitor platelets by anticoagulation protocol: Yes   Plan:  -Coumadin 1mg  today -Daily PT/INR  Hildred Laser, PharmD Clinical Pharmacist **Pharmacist phone directory can now be found on [email or web address] ([email or web address]).  Listed under [place].

## 2018-12-04 NOTE — Progress Notes (Signed)
PROGRESS NOTE    Angela [name]  IRC:[id number]  DOB: 21-Jul-1946  DOA: 11/26/2018 PCP: Lorene Dy, MD  Brief Narrative: 73 year old female with history of nonischemic cardiomyopathy, chronic systolic CHF, EF 15 to 51%, s/p mechanical MVRon chronic Coumadin, Hx of Vfib arrest,LBBBS/p St Judebiventricular ICD placement, atrial fibrillation, and memory deficits brought into the hospital (from primary surgeons office where she was being evaluated for perirectal infection/CT showing foci of gas) after she had a syncopal episode related to V. tach/V. fib causing defibrillation through her ICD.  ICD interrogation revealed multiple episodes of NSVT.  She was on IV amiodarone while in ICU now transition to oral.  Patient had supratherapeutic INR on presentation and required vitamin K/FFP which was subsequently transitioned to heparin drip while awaiting surgical intervention.  Patient now status post drainage of perineal abscess on April 4.  She remains on IV antibiotics. Patient is now having diarrhea (?  Related to stool softeners) has Flexi-Seal in place and is dependent on nursing for perineal care.  Physical therapy recommending skilled nursing facility placement.  Palliative care evaluation is pending.   Subjective:  Patient is awake alert and oriented x2.  She is disoriented to time.  She states she lives with her husband and son.  She does not recollect the syncopal or defibrillation episode.  She had one episode of NSVT yesterday.  Still has rectal tube  Objective: Vitals:   12/03/18 1942 12/04/18 0419 12/04/18 0500 12/04/18 1201  BP: 101/69 114/72  104/63  Pulse: 72 74  75  Resp: 18 14  18   Temp: 98.2 F (36.8 C) (!) 97.5 F (36.4 C)  98.1 F (36.7 C)  TempSrc: Oral Oral  Oral  SpO2: 98% 100%  100%  Weight:   63.2 kg   Height:        Intake/Output Summary (Last 24 hours) at 12/04/2018 1246 Last data filed at 12/04/2018 1104 Gross per 24 hour  Intake 400 ml  Output 2175 ml   Net -1775 ml   Filed Weights   12/02/18 0418 12/03/18 0353 12/04/18 0500  Weight: 65.2 kg 62.4 kg 63.2 kg    Physical Examination:  General exam: Appears calm and comfortable  Respiratory system: Clear to auscultation. Respiratory effort normal. Cardiovascular system: S1 & S2 heard, status post AICD. No JVD, murmurs, rubs, gallops or clicks. No pedal edema. Gastrointestinal system: Abdomen is nondistended, soft and nontender. No organomegaly or masses felt. Normal bowel sounds heard. Central nervous system: Alert and oriented. No focal neurological deficits. Extremities: Symmetric 5 x 5 power. Skin: No rashes, lesions or ulcers Psychiatry: Judgement and insight appear normal. Mood & affect appropriate.     Data Reviewed: I have personally reviewed following labs and imaging studies  CBC: Recent Labs  Lab 11/27/18 1440  11/30/18 0113 12/01/18 0754 12/02/18 0248 12/03/18 0343 12/04/18 0513  WBC 18.4*   < > 13.9* 10.7* 10.5 8.1 6.3  NEUTROABS 16.9*  --   --  9.2*  --   --   --   HGB 11.5*   < > 12.0 12.8 12.9 11.3* 11.2*  HCT 35.6*   < > 36.1 38.6 39.6 35.9* 36.1  MCV 88.1   < > 87.6 88.3 88.8 88.0 90.7  PLT 240   < > 274 278 292 270 278   < > = values in this interval not displayed.   Basic Metabolic Panel: Recent Labs  Lab 11/27/18 1440 11/27/18 2123  11/29/18 0328 11/30/18 0113 12/01/18 0754 12/02/18 0248 12/04/18  0513  NA 138 134*   < > 132* 130* 135 132* 133*  K 3.0* 3.2*   < > 4.0 4.3 4.7 5.1 4.1  CL 106 100   < > 102 102 102 103 98  CO2 23 22   < > 22 20* 22 20* 25  GLUCOSE 93 115*   < > 112* 124* 97 86 102*  BUN 8 9   < > 8 9 10 10 9   CREATININE 0.70 0.76   < > 0.82 0.96 1.01* 1.03* 0.86  CALCIUM 7.8* 8.0*   < > 8.0* 8.1* 8.8* 8.7* 8.6*  MG 1.9 2.1  --  1.9  --  1.9  --   --    < > = values in this interval not displayed.   GFR: Estimated Creatinine Clearance: 53.7 mL/min (by C-G formula based on SCr of 0.86 mg/dL). Liver Function Tests: No results  for input(s): AST, ALT, ALKPHOS, BILITOT, PROT, ALBUMIN in the last 168 hours. No results for input(s): LIPASE, AMYLASE in the last 168 hours. No results for input(s): AMMONIA in the last 168 hours. Coagulation Profile: Recent Labs  Lab 11/30/18 0113 12/01/18 0754 12/02/18 0248 12/03/18 0343 12/04/18 0513  INR 1.7* 2.0* 2.3* 2.7* 2.4*   Cardiac Enzymes: Recent Labs  Lab 11/27/18 1440  TROPONINI 0.30*   BNP (last 3 results) No results for input(s): PROBNP in the last 8760 hours. HbA1C: No results for input(s): HGBA1C in the last 72 hours. CBG: Recent Labs  Lab 12/03/18 2030 12/04/18 0042 12/04/18 0422 12/04/18 0834 12/04/18 1203  GLUCAP 83 122* 106* 90 110*   Lipid Profile: No results for input(s): CHOL, HDL, LDLCALC, TRIG, CHOLHDL, LDLDIRECT in the last 72 hours. Thyroid Function Tests: No results for input(s): TSH, T4TOTAL, FREET4, T3FREE, THYROIDAB in the last 72 hours. Anemia Panel: No results for input(s): VITAMINB12, FOLATE, FERRITIN, TIBC, IRON, RETICCTPCT in the last 72 hours. Sepsis Labs: No results for input(s): PROCALCITON, LATICACIDVEN in the last 168 hours.  Recent Results (from the past 240 hour(s))  Culture, blood (Routine X 2) w Reflex to ID Panel     Status: None   Collection Time: 11/26/18 11:45 PM  Result Value Ref Range Status   Specimen Description BLOOD RIGHT HAND  Final   Special Requests   Final    BOTTLES DRAWN AEROBIC ONLY Blood Culture adequate volume   Culture   Final    NO GROWTH 5 DAYS Performed at [facility], [address], [place], [place] [address]    Report Status 12/02/2018 FINAL  Final  Culture, blood (Routine X 2) w Reflex to ID Panel     Status: None   Collection Time: 11/26/18 11:59 PM  Result Value Ref Range Status   Specimen Description BLOOD LEFT HAND  Final   Special Requests   Final    BOTTLES DRAWN AEROBIC ONLY Blood Culture adequate volume   Culture   Final    NO GROWTH 5 DAYS Performed at [facility], [place] [address], [place], [place] [address]    Report Status 12/02/2018 FINAL  Final  MRSA PCR Screening     Status: None   Collection Time: 11/27/18 12:41 AM  Result Value Ref Range Status   MRSA by PCR NEGATIVE NEGATIVE Final    Comment:        The GeneXpert MRSA Assay (FDA approved for NASAL specimens only), is one component of a comprehensive MRSA colonization surveillance program. It is not  intended to diagnose MRSA infection nor to guide or monitor treatment for MRSA infections. Performed at [facility], [place] [address], [place], [place] [address]   Aerobic/Anaerobic Culture (surgical/deep wound)     Status: None   Collection Time: 11/28/18 10:32 AM  Result Value Ref Range Status   Specimen Description ABSCESS BUTTOCKS  Final   Special Requests PATIENT ON FOLLOWING VANC AND ZOSYN  Final   Gram Stain   Final    NO WBC SEEN RARE GRAM POSITIVE COCCI IN PAIRS RARE GRAM POSITIVE RODS Performed at [facility], [address], [place], [place] [address]    Culture   Final    RARE ESCHERICHIA COLI RARE CANDIDA ALBICANS MIXED ANAEROBIC FLORA PRESENT.  CALL LAB IF FURTHER IID REQUIRED.    Report Status 12/02/2018 FINAL  Final   Organism ID, Bacteria ESCHERICHIA COLI  Final      Susceptibility   Escherichia coli - MIC*    AMPICILLIN <=2 SENSITIVE Sensitive     CEFAZOLIN <=4 SENSITIVE Sensitive     CEFEPIME <=1 SENSITIVE Sensitive     CEFTAZIDIME <=1 SENSITIVE Sensitive     CEFTRIAXONE <=1 SENSITIVE Sensitive     CIPROFLOXACIN <=0.25 SENSITIVE Sensitive     GENTAMICIN <=1 SENSITIVE Sensitive     IMIPENEM <=0.25 SENSITIVE Sensitive     TRIMETH/SULFA <=20 SENSITIVE Sensitive     AMPICILLIN/SULBACTAM <=2 SENSITIVE Sensitive     PIP/TAZO <=4 SENSITIVE Sensitive     Extended ESBL NEGATIVE Sensitive     * RARE ESCHERICHIA COLI      Radiology Studies: No results found.      Scheduled Meds: . amiodarone  400 mg Oral BID  . Chlorhexidine Gluconate  Cloth  6 each Topical Q0600  . feeding supplement (ENSURE ENLIVE)  237 mL Oral TID BM  . feeding supplement (PRO-STAT SUGAR FREE 64)  30 mL Oral BID  . insulin aspart  0-9 Units Subcutaneous Q4H  . multivitamin with minerals  1 tablet Oral Daily  . psyllium  1 packet Oral Daily  . saccharomyces boulardii  250 mg Oral BID  . sodium chloride flush  3 mL Intravenous Q12H  . warfarin  1 mg Oral ONCE-1800  . Warfarin - Pharmacist Dosing Inpatient   Does not apply q1800   Continuous Infusions: . ampicillin-sulbactam (UNASYN) IV 3 g (12/04/18 0851)  . fluconazole (DIFLUCAN) IV 200 mg (12/04/18 1103)    Assessment & Plan:    1. Right perineal/perirectal abscesswith presence of gas/Fournier's gangrene: Status post debridement by surgical service on April 4.Blood cultures negative so far, mrsa screening negative, wound culture preliminary  result with pansensitive ecoli, rare candida and mixed anaerobic flora-wound care per GS , may need hydrotherapy per surgery.  Patient initially was receiving IV vancomycin/Zosyn.Per ID phone consultation, 2 weeks of total antibiotics, currently on Unasyn and Diflucan (planned through April 18)  2.Syncope/arrhythmia: Present on admission secondary to V. tach/V. fib status post ICD defibrillation x5.  Off lidocaine/amiodarone drip and now on oral amiodarone.  Seen by EP/cardiology initially, now signed off.  3.  Atrial fibrillation with sick sinus syndrome: Status post pacemaker.  Supratherapeutic INR on presentation which was reversed with vitamin K/FFP.  Been on IV heparin perioperatively.  Coumadin resumed on April 7.    4.  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  Currently on Heparin bridging until Coumadin therapeutic.  5. Nonischemic cardiomyopathy/ EF 15% / sp biVICD -Recent  hospitalization for heart failure from 3/2 to 3/4 -currently euvolemic to dry -close monitor volume status,  cardiology has signed off  6.  Diarrhea: In the setting of laxatives started by general surgery initially and concern for constipation.  KUB on April 6 unremarkable.  On psyllium/Florastor now.  Will hold laxatives and monitor for improvement.  DC rectal tube when diarrhea resolves.  7. Dementia/FTT -she had h/o colon cancer, she has h/o gi bleed, palliative care involved in 04/2017, family was not receptive to palliative care at the time per chart review.  Husband and son discussed with hospitalist on April 9, plan to meet with palliative care team to address care goals.  Dietary consultation for calorie count/nutritional needs.    DVT prophylaxis: On anticoagulation Code Status: Full code Family / Patient Communication: Lives with her son and husband who are now at bedside currently.  Palliative care meeting for goals of care pending Disposition Plan: Skilled nursing facility per PT recommendations    LOS: 8 days    Time spent: 35 minutes    [place] Shi, MD [facility] Pager [phone]  If 7PM-7AM, please contact night-coverage [email or web address] Password [place] 12/04/2018, 12:46 PM

## 2018-12-04 NOTE — Progress Notes (Signed)
Physical Therapy Treatment Patient Details Name: Angela [name] MRN: [id number] DOB: 1946-08-05 Today's Date: 12/04/2018    History of Present Illness Pt is a 73 y.o. female admitted from surgeon's office (there fore for evaluation of perirectal infection) on 11/26/18 after syncopal episode; found to be in V. tach which degenerated into v-fib requiring shock therapy from her ICD with multiple episodes of NSVT (per ICD interrogation). Now s/p I&D of perineal abscess on 4/4. PMH includes HF, NICM, s/p MVR, s/p ICD, afib, memory deficit.   PT Comments    Pt slowly progressing with mobility. Able to stand and initiate steps with bilateral HHA and modA+2. Noted leakage in flexiseal (RN notified); pt dependent for pericare and linen change. Will continue to follow acutely.  Follow Up Recommendations  SNF;Supervision/Assistance - 24 hour     Equipment Recommendations  (TBD)    Recommendations for Other Services       Precautions / Restrictions Precautions Precautions: Fall Precaution Comments: R buttock/perineal wound; rectal tube Restrictions Weight Bearing Restrictions: No    Mobility  Bed Mobility Overal bed mobility: Needs Assistance Bed Mobility: Supine to Sit     Supine to sit: Mod assist;+2 for physical assistance     General bed mobility comments: Pt able to initiate movement well, modA+2 for trunk elevation and to scoot hips with bed pad as to not dislodge rectal pouch  Transfers Overall transfer level: Needs assistance Equipment used: 2 person hand held assist Transfers: Sit to/from Stand Sit to Stand: Mod assist;+2 physical assistance;From elevated surface         General transfer comment: ModA+2 to assist trunk elevation; frequent cues for trunk/hip extension to maintain upright posture  Ambulation/Gait Ambulation/Gait assistance: Mod assist;+2 physical assistance Gait Distance (Feet): 1 Feet Assistive device: 2 person hand held assist Gait Pattern/deviations:  Step-to pattern;Trunk flexed Gait velocity: Decreased   General Gait Details: Took side steps towards HOB with bilat HHA and modA+2 for balance   Stairs             Wheelchair Mobility    Modified Rankin (Stroke Patients Only)       Balance Overall balance assessment: Needs assistance Sitting-balance support: Feet supported Sitting balance-Leahy Scale: Fair       Standing balance-Leahy Scale: Poor                              Cognition Arousal/Alertness: Awake/alert Behavior During Therapy: Flat affect Overall Cognitive Status: History of cognitive impairments - at baseline Area of Impairment: Orientation;Attention;Memory;Following commands;Safety/judgement;Awareness;Problem solving                 Orientation Level: Disoriented to;Place;Time;Situation Current Attention Level: Sustained Memory: Decreased short-term memory Following Commands: Follows one step commands inconsistently;Follows one step commands with increased time Safety/Judgement: Decreased awareness of deficits Awareness: Intellectual Problem Solving: Slow processing;Decreased initiation;Difficulty sequencing;Requires verbal cues        Exercises      General Comments General comments (skin integrity, edema, etc.): Returned to supine with pillow offloading R hip/buttocks (RN aware)      Pertinent Vitals/Pain Pain Assessment: Faces Faces Pain Scale: Hurts little more Pain Location: bottom Pain Descriptors / Indicators: Grimacing Pain Intervention(s): Monitored during session;Limited activity within patient's tolerance;Repositioned    Home Living                      Prior Function  PT Goals (current goals can now be found in the care plan section) Acute Rehab PT Goals Patient Stated Goal: none stated PT Goal Formulation: Patient unable to participate in goal setting Time For Goal Achievement: 12/14/18 Potential to Achieve Goals: Poor Progress  towards PT goals: Progressing toward goals    Frequency    Min 2X/week      PT Plan Current plan remains appropriate    Co-evaluation              AM-PAC PT "6 Clicks" Mobility   Outcome Measure  Help needed turning from your back to your side while in a flat bed without using bedrails?: A Lot Help needed moving from lying on your back to sitting on the side of a flat bed without using bedrails?: A Lot Help needed moving to and from a bed to a chair (including a wheelchair)?: A Lot Help needed standing up from a chair using your arms (e.g., wheelchair or bedside chair)?: A Lot   Help needed climbing 3-5 steps with a railing? : Total 6 Click Score: 9    End of Session   Activity Tolerance: Patient limited by pain Patient left: in bed;with call bell/phone within reach;with bed alarm set Nurse Communication: Mobility status PT Visit Diagnosis: Difficulty in walking, not elsewhere classified (R26.2);Pain     Time: 0315-9458 PT Time Calculation (min) (ACUTE ONLY): 24 min  Charges:  $Gait Training: 8-22 mins $Therapeutic Activity: 8-22 mins                    Mabeline Caras, PT, DPT Acute Rehabilitation Services  Pager [phone] Office [place] 12/04/2018, 11:59 AM

## 2018-12-05 LAB — PROTIME-INR
INR: 2.6 — ABNORMAL HIGH (ref 0.8–1.2)
Prothrombin Time: 27.1 seconds — ABNORMAL HIGH (ref 11.4–15.2)

## 2018-12-05 LAB — GLUCOSE, CAPILLARY
Glucose-Capillary: 103 mg/dL — ABNORMAL HIGH (ref 70–99)
Glucose-Capillary: 106 mg/dL — ABNORMAL HIGH (ref 70–99)
Glucose-Capillary: 119 mg/dL — ABNORMAL HIGH (ref 70–99)
Glucose-Capillary: 121 mg/dL — ABNORMAL HIGH (ref 70–99)
Glucose-Capillary: 128 mg/dL — ABNORMAL HIGH (ref 70–99)
Glucose-Capillary: 131 mg/dL — ABNORMAL HIGH (ref 70–99)
Glucose-Capillary: 136 mg/dL — ABNORMAL HIGH (ref 70–99)

## 2018-12-05 MED ORDER — WARFARIN SODIUM 1 MG PO TABS
1.0000 mg | ORAL_TABLET | Freq: Once | ORAL | Status: AC
  Administered 2018-12-05: 1 mg via ORAL
  Filled 2018-12-05: qty 1

## 2018-12-05 MED ORDER — AMOXICILLIN-POT CLAVULANATE 875-125 MG PO TABS
1.0000 | ORAL_TABLET | Freq: Two times a day (BID) | ORAL | Status: DC
  Administered 2018-12-05 – 2018-12-08 (×7): 1 via ORAL
  Filled 2018-12-05 (×7): qty 1

## 2018-12-05 NOTE — Progress Notes (Signed)
[place] for coumadin Indication: AFib, mechanical MVR, hx DVT  No Known Allergies  Patient Measurements: Height: 5\' 6"  (167.6 cm) Weight: 135 lb 5.8 oz (61.4 kg) IBW/kg (Calculated) : 59.3 Heparin Dosing Weight: 60kg  Vital Signs: Temp: 98.3 F (36.8 C) (04/11 0415) Temp Source: Oral (04/11 0415) BP: 100/57 (04/11 0415) Pulse Rate: 74 (04/11 0415)  Labs: Recent Labs    12/03/18 0343 12/04/18 0513 12/05/18 0228  HGB 11.3* 11.2*  --   HCT 35.9* 36.1  --   PLT 270 278  --   LABPROT 28.1* 26.1* 27.1*  INR 2.7* 2.4* 2.6*  HEPARINUNFRC 0.54  --   --   CREATININE  --  0.86  --     Estimated Creatinine Clearance: 53.7 mL/min (by C-G formula based on SCr of 0.86 mg/dL).   Assessment: 7 yoF on warfarin PTA for PAF, hx DVT, and mechanical MVR. Pt admitted with supratherapeutic INR of 9.1 s/p FFP and vitamin K due to urgent need for I&D on 4/4. SInce 4/7 when coumadin restarted she has received 0.5 -1mg /day) INR therapeutic at 2.6  Home coumadin dose: 8mg /day   Goal of Therapy:  Goal INR= 2.5-3.5 Monitor platelets by anticoagulation protocol: Yes   Plan:  Warfarin 1mg  PO x 1 tonight Daily INR, s/s bleeding  Bertis Ruddy, PharmD Clinical Pharmacist Please check AMION for all [place] numbers 12/05/2018 8:31 AM

## 2018-12-05 NOTE — TOC Initial Note (Signed)
Transition of Care [facility]) - Initial/Assessment Note    Patient Details  Name: Angela [name] MRN: [id number] Date of Birth: Jan 09, 1946  [facility] CM/SW Contact:    Eileen Stanford, LCSW Phone Number: 12/05/2018, 10:51 AM  Clinical Narrative:   Pt is alert and oriented x2. CSW spoke with pt's spouse via telephone. CSW explained recommendation for pt to go to SNF at d/c. Pt's spouse refusing SNF at d/c stating he doesn't want pt to go to nursing home during this virus. Pt's spouse states it is him and his son in the house and they can manage pt at d/c. Pt's spouse is agreeable to [facility]. Pt's spouse would appreciate a HH SW in order to assess home safety in the case that pt is unable to thrive at home and will need SNF placement from home. Pt has used [facility] in the past and pt's spouse is agreeable to use them again. Pt's spouse states pt has a cane at home. Pt has a PCP. Pt uses [facility]. Pt's spouse transport pt as needed and pt is able to purchase medications. CSW will reach out to [facility] to see if they can accept pt. Pt will need PT, OT, and SW.           Expected Discharge Plan: Skilled Nursing Facility Barriers to Discharge: Continued Medical Work up, Ship broker   Patient Goals and CMS Choice Patient states their goals for this hospitalization and ongoing recovery are:: To return home-per pt's spouse CMS Medicare.gov Compare Post Acute Care list provided to:: Patient Represenative (must comment)(spouse via phone) Choice offered to / list presented to : Spouse  Expected Discharge Plan and Services Expected Discharge Plan: [place] In-house Referral: Clinical Social Work   Post Acute Care Choice: [place] arrangements for the past 2 months: Single Family Home                     HH Arranged: PT, OT, Social Work [employer] Agency: Microbiologist ([place])  Prior Living Arrangements/Services Living  arrangements for the past 2 months: [place] with:: Adult Children, Spouse Patient language and need for interpreter reviewed:: No Do you feel safe going back to the place where you live?: Yes      Need for Family Participation in Patient Care: Yes (Comment) Care giver support system in place?: Yes (comment) Current home services: Other (comment)(N/A) Criminal Activity/Legal Involvement Pertinent to Current Situation/Hospitalization: No - Comment as needed  Activities of Daily Living      Permission Sought/Granted Permission sought to share information with : Family Supports, Customer service manager    Share Information with NAME: [name]  Permission granted to share info w AGENCY: [facility]  Permission granted to share info w Relationship: Spouse     Emotional Assessment Appearance:: Appears stated age Attitude/Demeanor/Rapport: Unable to Assess Affect (typically observed): Unable to Assess Orientation: : Oriented to Self, Oriented to Place Alcohol / Substance Use: Not Applicable Psych Involvement: No (comment)  Admission diagnosis:  Ventricular fibrillation ([place]) [I49.01] Perirectal abscess [K61.1] Ventricular tachycardia ([place]) [I47.2] Elevated INR [R79.1] Patient Active Problem List   Diagnosis Date Noted  . 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 Syncope 11/26/2018  . Ventricular tachycardia ([place]) 11/26/2018  . Elevated INR 11/26/2018  . Acute on chronic systolic (congestive) heart failure ([place]) 10/27/2018  . Elevated troponin 10/26/2018  . Anemia 10/26/2018  . Palliative care by specialist   .  Rectal bleeding 04/28/2017  . Acute blood loss anemia 04/28/2017  . ICD (implantable cardioverter-defibrillator), biventricular, in situ 08/24/2014  . Memory deficit 08/23/2014  . Abnormality of gait 08/23/2014  . Dementia ([place]) 12/17/2013  . 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 Hypotension due to drugs 07/12/2012  . Hypokalemia  07/12/2012  . NSVT (nonsustained ventricular tachycardia) ([place]) 07/12/2012  . Acute on chronic systolic heart failure ([place]) 07/07/2012  . Ventricular fibrillation ([place]) 11/11/2011  . Warfarin anticoagulation 09/22/2011  . Nonischemic cardiomyopathy ([place])   . Atrial fibrillation ([place])   . History of colon cancer   . Internal hemorrhoids   . Rheumatic heart disease   . Sick sinus syndrome ([place])   . Systolic CHF, chronic ([place])   . DIVERTICULAR DISEASE 06/05/2009   PCP:  Lorene Dy, MD Pharmacy:   [facility], [place] [address] Phone: [phone] Fax: [phone]     Social Determinants of Health (SDOH) Interventions    Readmission Risk Interventions Readmission Risk Prevention Plan 12/05/2018  Transportation Screening Complete  PCP or Specialist Appt within 3-5 Days Complete  HRI or [place] (No Data)  Palliative Care Screening Not Applicable  Medication Review (RN Care Manager) Complete  Some recent data might be hidden

## 2018-12-05 NOTE — Progress Notes (Signed)
Performed wound care per order: Wound care to buttock abscess site: Cleanse with NS, pat gently dry. Cover with ABD and secure with paper tape. Change twice daily and PRN.  Patient tolerated procedure well.  Also, updated sister Danton Clap) by phone of patient's condition.

## 2018-12-05 NOTE — Progress Notes (Signed)
PROGRESS NOTE    Angela [name]  CMK:[id number]  DOB: 12/10/1945  DOA: 11/26/2018 PCP: Lorene Dy, MD  Brief Narrative: 73 year old female with history of nonischemic cardiomyopathy, chronic systolic CHF, EF 15 to 56%, s/p mechanical MVRon chronic Coumadin, Hx of Vfib arrest,LBBBS/p St Judebiventricular ICD placement, atrial fibrillation, and memory deficits brought into the hospital (from primary surgeons office where she was being evaluated for perirectal infection/CT showing foci of gas) after she had a syncopal episode related to V. tach/V. fib causing defibrillation through her ICD.  ICD interrogation revealed multiple episodes of NSVT.  She was on IV amiodarone while in ICU now transition to oral.  Patient had supratherapeutic INR on presentation and required vitamin K/FFP which was subsequently transitioned to heparin drip while awaiting surgical intervention.  Patient now status post drainage of perineal abscess on April 4.  She remains on IV antibiotics. Patient is now having diarrhea (?  Related to stool softeners) has Flexi-Seal in place and is dependent on nursing for perineal care.  Physical therapy recommending skilled nursing facility placement.  Palliative care evaluation is pending.   Subjective:  Patient is awake alert and oriented x2.  She remains disoriented to time.  She states she lives with her husband and son.  She does not recollect the syncopal or defibrillation episode.  Still has rectal tube  Objective: Vitals:   12/04/18 1607 12/04/18 2027 12/05/18 0105 12/05/18 0415  BP: 115/68 99/60  (!) 100/57  Pulse:  80  74  Resp: 20 18  17   Temp: 98.9 F (37.2 C) 98.6 F (37 C)  98.3 F (36.8 C)  TempSrc: Oral Oral  Oral  SpO2:  100% 98% 100%  Weight:    61.4 kg  Height:        Intake/Output Summary (Last 24 hours) at 12/05/2018 1048 Last data filed at 12/05/2018 1009 Gross per 24 hour  Intake 223 ml  Output 1600 ml  Net -1377 ml   Filed Weights   12/03/18 0353 12/04/18 0500 12/05/18 0415  Weight: 62.4 kg 63.2 kg 61.4 kg    Physical Examination:  General exam: Appears calm and comfortable  Respiratory system: Clear to auscultation. Respiratory effort normal. Cardiovascular system: S1 & S2 heard, status post AICD. No JVD, murmurs, rubs, gallops or clicks. No pedal edema. Gastrointestinal system: Abdomen is nondistended, soft and nontender. No organomegaly or masses felt. Normal bowel sounds heard. Central nervous system: Alert and orientedx2. No focal neurological deficits. Extremities: Symmetric 5 x 5 power. Skin: Dry dressing/kerlix at perineal wound site. Psychiatry: calm, memory deficits   Data Reviewed: I have personally reviewed following labs and imaging studies  CBC: Recent Labs  Lab 11/30/18 0113 12/01/18 0754 12/02/18 0248 12/03/18 0343 12/04/18 0513  WBC 13.9* 10.7* 10.5 8.1 6.3  NEUTROABS  --  9.2*  --   --   --   HGB 12.0 12.8 12.9 11.3* 11.2*  HCT 36.1 38.6 39.6 35.9* 36.1  MCV 87.6 88.3 88.8 88.0 90.7  PLT 274 278 292 270 979   Basic Metabolic Panel: Recent Labs  Lab 11/29/18 0328 11/30/18 0113 12/01/18 0754 12/02/18 0248 12/04/18 0513  NA 132* 130* 135 132* 133*  K 4.0 4.3 4.7 5.1 4.1  CL 102 102 102 103 98  CO2 22 20* 22 20* 25  GLUCOSE 112* 124* 97 86 102*  BUN 8 9 10 10 9   CREATININE 0.82 0.96 1.01* 1.03* 0.86  CALCIUM 8.0* 8.1* 8.8* 8.7* 8.6*  MG 1.9  --  1.9  --   --  GFR: Estimated Creatinine Clearance: 53.7 mL/min (by C-G formula based on SCr of 0.86 mg/dL). Liver Function Tests: No results for input(s): AST, ALT, ALKPHOS, BILITOT, PROT, ALBUMIN in the last 168 hours. No results for input(s): LIPASE, AMYLASE in the last 168 hours. No results for input(s): AMMONIA in the last 168 hours. Coagulation Profile: Recent Labs  Lab 12/01/18 0754 12/02/18 0248 12/03/18 0343 12/04/18 0513 12/05/18 0228  INR 2.0* 2.3* 2.7* 2.4* 2.6*   Cardiac Enzymes: No results for input(s):  CKTOTAL, CKMB, CKMBINDEX, TROPONINI in the last 168 hours. BNP (last 3 results) No results for input(s): PROBNP in the last 8760 hours. HbA1C: No results for input(s): HGBA1C in the last 72 hours. CBG: Recent Labs  Lab 12/04/18 2030 12/05/18 0104 12/05/18 0418 12/05/18 0738 12/05/18 1042  GLUCAP 103* 121* 136* 106* 128*   Lipid Profile: No results for input(s): CHOL, HDL, LDLCALC, TRIG, CHOLHDL, LDLDIRECT in the last 72 hours. Thyroid Function Tests: No results for input(s): TSH, T4TOTAL, FREET4, T3FREE, THYROIDAB in the last 72 hours. Anemia Panel: No results for input(s): VITAMINB12, FOLATE, FERRITIN, TIBC, IRON, RETICCTPCT in the last 72 hours. Sepsis Labs: No results for input(s): PROCALCITON, LATICACIDVEN in the last 168 hours.  Recent Results (from the past 240 hour(s))  Culture, blood (Routine X 2) w Reflex to ID Panel     Status: None   Collection Time: 11/26/18 11:45 PM  Result Value Ref Range Status   Specimen Description BLOOD RIGHT HAND  Final   Special Requests   Final    BOTTLES DRAWN AEROBIC ONLY Blood Culture adequate volume   Culture   Final    NO GROWTH 5 DAYS Performed at [facility], [address] [place], [place] [address]    Report Status 12/02/2018 FINAL  Final  Culture, blood (Routine X 2) w Reflex to ID Panel     Status: None   Collection Time: 11/26/18 11:59 PM  Result Value Ref Range Status   Specimen Description BLOOD LEFT HAND  Final   Special Requests   Final    BOTTLES DRAWN AEROBIC ONLY Blood Culture adequate volume   Culture   Final    NO GROWTH 5 DAYS Performed at [facility], [place] [address], [place], [place] [address]    Report Status 12/02/2018 FINAL  Final  MRSA PCR Screening     Status: None   Collection Time: 11/27/18 12:41 AM  Result Value Ref Range Status   MRSA by PCR NEGATIVE NEGATIVE Final    Comment:        The GeneXpert MRSA Assay (FDA approved for NASAL specimens only), is one component of a  comprehensive MRSA colonization surveillance program. It is not intended to diagnose MRSA infection nor to guide or monitor treatment for MRSA infections. Performed at [facility], [place] [address], [place], [place] [address]   Aerobic/Anaerobic Culture (surgical/deep wound)     Status: None   Collection Time: 11/28/18 10:32 AM  Result Value Ref Range Status   Specimen Description ABSCESS BUTTOCKS  Final   Special Requests PATIENT ON FOLLOWING VANC AND ZOSYN  Final   Gram Stain   Final    NO WBC SEEN RARE GRAM POSITIVE COCCI IN PAIRS RARE GRAM POSITIVE RODS Performed at [facility], [address], [place],  [address]    Culture   Final    RARE ESCHERICHIA COLI RARE CANDIDA ALBICANS MIXED ANAEROBIC FLORA PRESENT.  CALL LAB IF FURTHER IID  REQUIRED.    Report Status 12/02/2018 FINAL  Final   Organism ID, Bacteria ESCHERICHIA COLI  Final      Susceptibility   Escherichia coli - MIC*    AMPICILLIN <=2 SENSITIVE Sensitive     CEFAZOLIN <=4 SENSITIVE Sensitive     CEFEPIME <=1 SENSITIVE Sensitive     CEFTAZIDIME <=1 SENSITIVE Sensitive     CEFTRIAXONE <=1 SENSITIVE Sensitive     CIPROFLOXACIN <=0.25 SENSITIVE Sensitive     GENTAMICIN <=1 SENSITIVE Sensitive     IMIPENEM <=0.25 SENSITIVE Sensitive     TRIMETH/SULFA <=20 SENSITIVE Sensitive     AMPICILLIN/SULBACTAM <=2 SENSITIVE Sensitive     PIP/TAZO <=4 SENSITIVE Sensitive     Extended ESBL NEGATIVE Sensitive     * RARE ESCHERICHIA COLI      Radiology Studies: No results found.      Scheduled Meds: . amiodarone  400 mg Oral BID  . Chlorhexidine Gluconate Cloth  6 each Topical Q0600  . feeding supplement (ENSURE ENLIVE)  237 mL Oral TID BM  . feeding supplement (PRO-STAT SUGAR FREE 64)  30 mL Oral BID  . insulin aspart  0-9 Units Subcutaneous Q4H  . multivitamin with minerals  1 tablet Oral Daily  . saccharomyces boulardii  250 mg Oral BID  . sodium chloride flush  3 mL Intravenous Q12H  .  warfarin  1 mg Oral ONCE-1800  . Warfarin - Pharmacist Dosing Inpatient   Does not apply q1800   Continuous Infusions: . ampicillin-sulbactam (UNASYN) IV 3 g (12/05/18 0854)  . fluconazole (DIFLUCAN) IV 200 mg (12/05/18 1013)    Assessment & Plan:    1. Right perineal/perirectal abscesswith presence of gas/Fournier's gangrene: Status post debridement by surgical service on 4/4. Patient initially was receiving IV vancomycin/Zosyn. Blood cultures negative so far. MRSA screening negative, wound culture preliminary  result with pansensitive ecoli, rare candida and mixed anaerobic flora-  Per ID phone consultation, 2 weeks of total antibiotics, currently on Unasyn and Diflucan (planned through April 18). Wet to dry dressing changes BID , may need hydrotherapy per surgery.  2.Syncope/arrhythmia: Present on admission secondary to V. tach/V. fib status post ICD defibrillation x5.  Off lidocaine/amiodarone drip and now on oral amiodarone.  Seen by EP/cardiology initially, now signed off. Had NSVT episode on 4/9  3.  Atrial fibrillation with sick sinus syndrome: Status post pacemaker.  Supratherapeutic INR on presentation which was reversed with vitamin K/FFP.  Been on IV heparin perioperatively.  Coumadin resumed on April 7.  INR therapeutic now and off Heparin.   4.  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  Currently Coumadin resumed and  therapeutic.  5. Nonischemic cardiomyopathy/ EF 15% / sp biVICD : Recent hospitalization for heart failure from 3/2 to 3/4. currently euvolemic and possibly dry with diarrhea.  -closely monitor volume status, cardiology has signed off  6.  Diarrhea: In the setting of laxatives (started by general surgery initially in concern for constipation).  KUB on April 6 unremarkable.  D/Ced laxatives and monitor for improvement.  DC rectal tube when diarrhea resolves. Continue Florastor as patient on abx  7.  Dementia/FTT -she had h/o colon cancer, she has h/o gi bleed, palliative care involved in 04/2017, family was not receptive to palliative care at the time per chart review.  Husband and son discussed with hospitalist on April 9, plan to meet with palliative care team to address care goals.  Dietary consultation for calorie count/nutritional  needs.  DVT prophylaxis: On anticoagulation Code Status: Full code Family / Patient Communication: Lives with her son and husband .Palliative care meeting for goals of care pending.  Disposition Plan: Skilled nursing facility per PT recommendations. CM looking into options. Can transition abx to oral once diarrhea improves and off rectal tube.    LOS: 9 days    Time spent: 35 minutes    Guilford Shi, MD [facility] Pager [phone]  If 7PM-7AM, please contact night-coverage [email or web address] Password [facility] 12/05/2018, 10:48 AM

## 2018-12-06 LAB — GLUCOSE, CAPILLARY
Glucose-Capillary: 103 mg/dL — ABNORMAL HIGH (ref 70–99)
Glucose-Capillary: 87 mg/dL (ref 70–99)
Glucose-Capillary: 108 mg/dL — ABNORMAL HIGH (ref 70–99)
Glucose-Capillary: 96 mg/dL (ref 70–99)
Glucose-Capillary: 110 mg/dL — ABNORMAL HIGH (ref 70–99)

## 2018-12-06 LAB — PROTIME-INR
INR: 2.3 — ABNORMAL HIGH (ref 0.8–1.2)
Prothrombin Time: 24.9 seconds — ABNORMAL HIGH (ref 11.4–15.2)

## 2018-12-06 MED ORDER — WARFARIN SODIUM 2 MG PO TABS
2.0000 mg | ORAL_TABLET | Freq: Once | ORAL | Status: AC
  Administered 2018-12-06: 2 mg via ORAL
  Filled 2018-12-06: qty 1

## 2018-12-06 MED ORDER — FLUCONAZOLE 100 MG PO TABS
200.0000 mg | ORAL_TABLET | Freq: Every day | ORAL | Status: DC
  Administered 2018-12-07 – 2018-12-08 (×2): 200 mg via ORAL
  Filled 2018-12-06 (×2): qty 2

## 2018-12-06 NOTE — Progress Notes (Signed)
ANTICOAGULATION CONSULT NOTE  Pharmacy Consult for coumadin Indication: AFib, mechanical MVR, hx DVT  No Known Allergies  Patient Measurements: Height: 5\' 6"  (167.6 cm) Weight: 137 lb 5.6 oz (62.3 kg) IBW/kg (Calculated) : 59.3 Heparin Dosing Weight: 60kg  Vital Signs: Temp: 97.6 F (36.4 C) (04/12 0431) Temp Source: Oral (04/12 0431) BP: 118/78 (04/12 0431) Pulse Rate: 74 (04/12 0431)  Labs: Recent Labs    12/04/18 0513 12/05/18 0228 12/06/18 0325  HGB 11.2*  --   --   HCT 36.1  --   --   PLT 278  --   --   LABPROT 26.1* 27.1* 24.9*  INR 2.4* 2.6* 2.3*  CREATININE 0.86  --   --     Estimated Creatinine Clearance: 53.7 mL/min (by C-G formula based on SCr of 0.86 mg/dL).   Assessment: 75 yoF on warfarin PTA for PAF, hx DVT, and mechanical MVR. Pt admitted with supratherapeutic INR of 9.1 s/p FFP and vitamin K due to urgent need for I&D on 4/4. SInce 4/7 when coumadin restarted she has received 0.5 -1mg /day)  INR slightly subtherapeutic at 2.3 with higher goals, day 4 of fluconazole tx which DDI will lower warfarin requirements.    Home coumadin dose: 8mg /day  Goal of Therapy:  Goal INR= 2.5-3.5 Monitor platelets by anticoagulation protocol: Yes   Plan:  Warfarin 2mg  PO x 1 tonight Daily INR, s/s bleeding  Bertis Ruddy, PharmD Clinical Pharmacist Please check AMION for all [place] numbers 12/06/2018 8:57 AM

## 2018-12-06 NOTE — Progress Notes (Signed)
Pt noted to have distention in her abdomen and kept saying she "had to pee so bad". Foley in place, but draining slowly. Bladder scan showed 420cc. Obtained order to flush foley, did not work. Obtained order to replace foley. New foley is placed and urine flow is better. Rechecked 30 minutes later, abdomen is soft and bladder scan shows > 10ccs of urine. Will continue to monitor.

## 2018-12-06 NOTE — Progress Notes (Addendum)
PROGRESS NOTE    Angela [name]  RXV:[id number]  DOB: [birth year]  DOA: 11/26/2018 PCP: Lorene Dy, MD  Brief Narrative: 73 year old female with history of nonischemic cardiomyopathy, chronic systolic CHF, EF 15 to 50%, s/p mechanical MVRon chronic Coumadin, Hx of Vfib arrest,LBBBS/p St Judebiventricular ICD placement, atrial fibrillation, and memory deficits brought into the hospital (from primary surgeons office where she was being evaluated for perirectal infection/CT showing foci of gas) after she had a syncopal episode related to V. tach/V. fib causing defibrillation through her ICD.  ICD interrogation revealed multiple episodes of NSVT.  She was on IV amiodarone while in ICU now transition to oral.  Patient had supratherapeutic INR on presentation and required vitamin K/FFP which was subsequently transitioned to heparin drip while awaiting surgical intervention.  Patient now status post drainage of perineal abscess on April 4.  She remains on IV antibiotics. Patient is now having diarrhea (?  Related to stool softeners) has Flexi-Seal in place and is dependent on nursing for perineal care.  Physical therapy recommending skilled nursing facility placement.  Palliative care evaluation is pending.   Subjective:  Patient is awake alert and oriented x2.  She remains disoriented to time.  She states she lives with her husband and son.  She does not recollect the syncopal or defibrillation episode.  Still has rectal tube but stool appears somewhat formed. Lost IV access yesterday but has peripheral line now on Rt arm  Objective: Vitals:   12/05/18 2048 12/05/18 2344 12/05/18 2346 12/06/18 0431  BP: 112/71 (!) 92/58 106/61 118/78  Pulse: 73  75 74  Resp: 18     Temp: 98.3 F (36.8 C) 98.1 F (36.7 C)  97.6 F (36.4 C)  TempSrc: Oral Oral  Oral  SpO2: 100% 99%  100%  Weight:    62.3 kg  Height:        Intake/Output Summary (Last 24 hours) at 12/06/2018 0950 Last data filed at  12/06/2018 0700 Gross per 24 hour  Intake 433 ml  Output 927 ml  Net -494 ml   Filed Weights   12/04/18 0500 12/05/18 0415 12/06/18 0431  Weight: 63.2 kg 61.4 kg 62.3 kg    Physical Examination:  General exam: Appears calm and comfortable  Respiratory system: Clear to auscultation. Respiratory effort normal. Cardiovascular system: S1 & S2 heard, status post AICD. No JVD, murmurs, rubs, gallops or clicks. No pedal edema. Gastrointestinal system: Abdomen is nondistended, soft and nontender. No organomegaly or masses felt. Normal bowel sounds heard. Central nervous system: Alert and orientedx2. No focal neurological deficits. Extremities: Symmetric 5 x 5 power. Skin: Dry dressing/kerlix at perineal wound site. Psychiatry: calm, memory deficits   Data Reviewed: I have personally reviewed following labs and imaging studies  CBC: Recent Labs  Lab 11/30/18 0113 12/01/18 0754 12/02/18 0248 12/03/18 0343 12/04/18 0513  WBC 13.9* 10.7* 10.5 8.1 6.3  NEUTROABS  --  9.2*  --   --   --   HGB 12.0 12.8 12.9 11.3* 11.2*  HCT 36.1 38.6 39.6 35.9* 36.1  MCV 87.6 88.3 88.8 88.0 90.7  PLT 274 278 292 270 932   Basic Metabolic Panel: Recent Labs  Lab 11/30/18 0113 12/01/18 0754 12/02/18 0248 12/04/18 0513  NA 130* 135 132* 133*  K 4.3 4.7 5.1 4.1  CL 102 102 103 98  CO2 20* 22 20* 25  GLUCOSE 124* 97 86 102*  BUN 9 10 10 9   CREATININE 0.96 1.01* 1.03* 0.86  CALCIUM 8.1* 8.8* 8.7*  8.6*  MG  --  1.9  --   --    GFR: Estimated Creatinine Clearance: 53.7 mL/min (by C-G formula based on SCr of 0.86 mg/dL). Liver Function Tests: No results for input(s): AST, ALT, ALKPHOS, BILITOT, PROT, ALBUMIN in the last 168 hours. No results for input(s): LIPASE, AMYLASE in the last 168 hours. No results for input(s): AMMONIA in the last 168 hours. Coagulation Profile: Recent Labs  Lab 12/02/18 0248 12/03/18 0343 12/04/18 0513 12/05/18 0228 12/06/18 0325  INR 2.3* 2.7* 2.4* 2.6* 2.3*    Cardiac Enzymes: No results for input(s): CKTOTAL, CKMB, CKMBINDEX, TROPONINI in the last 168 hours. BNP (last 3 results) No results for input(s): PROBNP in the last 8760 hours. HbA1C: No results for input(s): HGBA1C in the last 72 hours. CBG: Recent Labs  Lab 12/05/18 1645 12/05/18 2047 12/05/18 2340 12/06/18 0429 12/06/18 0745  GLUCAP 119* 103* 131* 103* 87   Lipid Profile: No results for input(s): CHOL, HDL, LDLCALC, TRIG, CHOLHDL, LDLDIRECT in the last 72 hours. Thyroid Function Tests: No results for input(s): TSH, T4TOTAL, FREET4, T3FREE, THYROIDAB in the last 72 hours. Anemia Panel: No results for input(s): VITAMINB12, FOLATE, FERRITIN, TIBC, IRON, RETICCTPCT in the last 72 hours. Sepsis Labs: No results for input(s): PROCALCITON, LATICACIDVEN in the last 168 hours.  Recent Results (from the past 240 hour(s))  Culture, blood (Routine X 2) w Reflex to ID Panel     Status: None   Collection Time: 11/26/18 11:45 PM  Result Value Ref Range Status   Specimen Description BLOOD RIGHT HAND  Final   Special Requests   Final    BOTTLES DRAWN AEROBIC ONLY Blood Culture adequate volume   Culture   Final    NO GROWTH 5 DAYS Performed at [facility], [address], [place], [place] [address]    Report Status 12/02/2018 FINAL  Final  Culture, blood (Routine X 2) w Reflex to ID Panel     Status: None   Collection Time: 11/26/18 11:59 PM  Result Value Ref Range Status   Specimen Description BLOOD LEFT HAND  Final   Special Requests   Final    BOTTLES DRAWN AEROBIC ONLY Blood Culture adequate volume   Culture   Final    NO GROWTH 5 DAYS Performed at [facility], [place] [address] [place], [place] [address]    Report Status 12/02/2018 FINAL  Final  MRSA PCR Screening     Status: None   Collection Time: 11/27/18 12:41 AM  Result Value Ref Range Status   MRSA by PCR NEGATIVE NEGATIVE Final    Comment:        The GeneXpert MRSA Assay (FDA approved for NASAL  specimens only), is one component of a comprehensive MRSA colonization surveillance program. It is not intended to diagnose MRSA infection nor to guide or monitor treatment for MRSA infections. Performed at [facility], [place] [address], [place], [place] [address]   Aerobic/Anaerobic Culture (surgical/deep wound)     Status: None   Collection Time: 11/28/18 10:32 AM  Result Value Ref Range Status   Specimen Description ABSCESS BUTTOCKS  Final   Special Requests PATIENT ON FOLLOWING VANC AND ZOSYN  Final   Gram Stain   Final    NO WBC SEEN RARE GRAM POSITIVE COCCI IN PAIRS RARE GRAM POSITIVE RODS Performed at [facility], [address], [place], [place] [address]    Culture   Final    RARE  ESCHERICHIA COLI RARE CANDIDA ALBICANS MIXED ANAEROBIC FLORA PRESENT.  CALL LAB IF FURTHER IID REQUIRED.    Report Status 12/02/2018 FINAL  Final   Organism ID, Bacteria ESCHERICHIA COLI  Final      Susceptibility   Escherichia coli - MIC*    AMPICILLIN <=2 SENSITIVE Sensitive     CEFAZOLIN <=4 SENSITIVE Sensitive     CEFEPIME <=1 SENSITIVE Sensitive     CEFTAZIDIME <=1 SENSITIVE Sensitive     CEFTRIAXONE <=1 SENSITIVE Sensitive     CIPROFLOXACIN <=0.25 SENSITIVE Sensitive     GENTAMICIN <=1 SENSITIVE Sensitive     IMIPENEM <=0.25 SENSITIVE Sensitive     TRIMETH/SULFA <=20 SENSITIVE Sensitive     AMPICILLIN/SULBACTAM <=2 SENSITIVE Sensitive     PIP/TAZO <=4 SENSITIVE Sensitive     Extended ESBL NEGATIVE Sensitive     * RARE ESCHERICHIA COLI      Radiology Studies: No results found.      Scheduled Meds: . amiodarone  400 mg Oral BID  . amoxicillin-clavulanate  1 tablet Oral Q12H  . Chlorhexidine Gluconate Cloth  6 each Topical Q0600  . feeding supplement (ENSURE ENLIVE)  237 mL Oral TID BM  . feeding supplement (PRO-STAT SUGAR FREE 64)  30 mL Oral BID  . insulin aspart  0-9 Units Subcutaneous Q4H  . multivitamin with minerals  1 tablet Oral Daily  .  saccharomyces boulardii  250 mg Oral BID  . sodium chloride flush  3 mL Intravenous Q12H  . warfarin  2 mg Oral ONCE-1800  . Warfarin - Pharmacist Dosing Inpatient   Does not apply q1800   Continuous Infusions: . fluconazole (DIFLUCAN) IV 200 mg (12/05/18 1013)    Assessment & Plan:    1. Right perineal/perirectal abscesswith presence of gas/Fournier's gangrene: Status post debridement by surgical service on 4/4. Patient initially was receiving IV vancomycin/Zosyn. Blood cultures negative so far. MRSA screening negative, wound culture preliminary  result with pansensitive ecoli, rare candida and mixed anaerobic flora-  Per ID phone consultation, 2 weeks of total antibiotics, currently on Augmentin (unasyn changed to po) and Diflucan (planned through April 18). Wet to dry dressing changes BID , may need hydrotherapy per surgery.  2.Syncope/arrhythmia: Present on admission secondary to V. tach/V. fib status post ICD defibrillation x5.  Off lidocaine/amiodarone drip and now on oral amiodarone.  Seen by EP/cardiology initially, now signed off. Had NSVT episode on 4/9. Check electrolytes again including mag in am.  3.  Atrial fibrillation with sick sinus syndrome: Status post pacemaker.  Supratherapeutic INR on presentation which was reversed with vitamin K/FFP.  Been on IV heparin perioperatively.  Coumadin resumed on April 7.  INR therapeutic now and off Heparin.   4.  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  Currently Coumadin resumed and  therapeutic.  5. Nonischemic cardiomyopathy/ EF 15% / sp biVICD : Recent hospitalization for heart failure from 3/2 to 3/4. currently euvolemic and possibly dry with diarrhea.  -closely monitor volume status, cardiology has signed off  6.  Diarrhea: In the setting of laxatives (started by general surgery initially in concern for constipation).  KUB on April 6 unremarkable.  D/Ced laxatives  and showing improvement.  DC rectal tube possibly today as d/w nurse. Continue Florastor as patient on abx  7. Dementia/FTT -she had h/o colon cancer, she has h/o gi bleed, palliative care involved in 04/2017, family was not receptive to palliative care at the time per chart review.  Husband and son discussed with hospitalist on April 9, plan to meet with palliative care team to address care goals.  Dietary consultation for calorie count/nutritional needs.  DVT prophylaxis: On anticoagulation Code Status: Full code Family / Patient Communication: Lives with her son and husband .Palliative care meeting for goals of care pending.  Disposition Plan: Skilled nursing facility per PT recommendations. CM looking into options.    LOS: 10 days    Time spent: 25 minutes    Guilford Shi, MD [facility] Pager [phone]  If 7PM-7AM, please contact night-coverage [email or web address] Password [facility] 12/06/2018, 9:50 AM

## 2018-12-07 DIAGNOSIS — Z515 Encounter for palliative care: Secondary | ICD-10-CM

## 2018-12-07 DIAGNOSIS — Z7189 Other specified counseling: Secondary | ICD-10-CM

## 2018-12-07 LAB — GLUCOSE, CAPILLARY
Glucose-Capillary: 106 mg/dL — ABNORMAL HIGH (ref 70–99)
Glucose-Capillary: 106 mg/dL — ABNORMAL HIGH (ref 70–99)
Glucose-Capillary: 119 mg/dL — ABNORMAL HIGH (ref 70–99)
Glucose-Capillary: 158 mg/dL — ABNORMAL HIGH (ref 70–99)
Glucose-Capillary: 85 mg/dL (ref 70–99)
Glucose-Capillary: 88 mg/dL (ref 70–99)
Glucose-Capillary: 92 mg/dL (ref 70–99)

## 2018-12-07 LAB — PROTIME-INR
INR: 2 — ABNORMAL HIGH (ref 0.8–1.2)
Prothrombin Time: 22.5 seconds — ABNORMAL HIGH (ref 11.4–15.2)

## 2018-12-07 LAB — BASIC METABOLIC PANEL
Anion gap: 10 (ref 5–15)
BUN: 6 mg/dL — ABNORMAL LOW (ref 8–23)
CO2: 23 mmol/L (ref 22–32)
Calcium: 8.8 mg/dL — ABNORMAL LOW (ref 8.9–10.3)
Chloride: 99 mmol/L (ref 98–111)
Creatinine, Ser: 0.8 mg/dL (ref 0.44–1.00)
GFR calc Af Amer: >60 mL/min (ref >60)
GFR calc non Af Amer: >60 mL/min (ref >60)
Glucose, Bld: 96 mg/dL (ref 70–99)
Potassium: 4.3 mmol/L (ref 3.5–5.1)
Sodium: 132 mmol/L — ABNORMAL LOW (ref 135–145)

## 2018-12-07 LAB — MAGNESIUM: Magnesium: 2 mg/dL (ref 1.7–2.4)

## 2018-12-07 MED ORDER — WARFARIN SODIUM 5 MG PO TABS
5.0000 mg | ORAL_TABLET | Freq: Once | ORAL | Status: AC
  Administered 2018-12-07: 5 mg via ORAL
  Filled 2018-12-07: qty 1

## 2018-12-07 NOTE — Progress Notes (Signed)
Patient will be discharging today and only needs the PIV for just in case, Spoke with [facility] RN and notified her if the patient needs the PIV then IV team will come and place but at this time the IV is not needed. Patient has no IV medications scheduled.

## 2018-12-07 NOTE — Progress Notes (Signed)
Nutrition Follow-up  RD working remotely.  DOCUMENTATION CODES:   Severe malnutrition in context of acute illness/injury  INTERVENTION:   Continue 30 ml Prostat BID, each supplement provides 100 kcals and 15 grams protein.   Continue Ensure Enlive po TID, each supplement provides 350 kcal and 20 grams of protein  Continue MVI with minerals  If within [place], recommend Cortrak placement with initiation of nutrition support (service Monday & Friday, 8am-4pm).   If TF desired: Jevity 1.2  @ 20 ml/hr via Cortrak  Increase by 10 ml Q8 hours to goal rate of 55 ml/hr (1320 ml) 30 ml Prostat BID  At goal TF provides: 1784 kcals, 103 grams protein, 1065 ml free water. Meets 100% of needs.   NUTRITION DIAGNOSIS:   Inadequate oral intake related to lethargy/confusion, acute illness as evidenced by meal completion < 25%.  Ongoing, no improvement  Additional Nutrition Diagnosis: 4/13 Severe malnutrition related to acute illness as evidenced by energy intake <50% for >/= 5 days, 9% wt loss x 10 days  GOAL:   Patient will meet greater than or equal to 90% of their needs  Not meeting with PO intake and oral nutrition supplements  MONITOR:   PO intake, Supplement acceptance, Weight trends, Skin, Labs  REASON FOR ASSESSMENT:   Consult Assessment of nutrition requirement/status  ASSESSMENT:   73 year old female who presented to the ED on 4/2 from surgical center after having a syncopal episode in the lobby associated with ICD shocks. 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 Surgery consulted for perineal abscess.  Per chart review, pt may d/c today. Palliative team reconsulted to discuss goals of care. PO intake continues to be poor, <25% meal completion. Pt with new malnutrition diagnosis acquired throughout admission, unlikely to resolve with current PO intake.   Labs: sodium 132 (L), BUN  6 (L)  Meds: Ensure Enlive TID, ProStat BID, Novolog, MVI with minerals, warfarin  NUTRITION - FOCUSED PHYSICAL EXAM: Deferred - RD working remotely  Diet Order:  No known food allergies 4/4 clear liquid 4/5 Regular Diet Order            Diet regular Room service appropriate? 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 considered underweight for advanced age  Estimated Nutritional Needs:   Kcal:  1500-1700  Protein:  85-100 grams  Fluid:  1.5-1.7 L  Althea Grimmer, MS, RDN, LDN Pager: [phone] Available Mondays and Fridays, 9am-2pm

## 2018-12-07 NOTE — Progress Notes (Signed)
ANTICOAGULATION CONSULT NOTE - Follow Up Consult  Pharmacy Consult for Coumadin Indication: PAF, mMVR, h/o DVT and CVA  No Known Allergies  Patient Measurements: Height: 5\' 6"  (167.6 cm) Weight: 136 lb 3.9 oz (61.8 kg) IBW/kg (Calculated) : 59.3  Vital Signs: Temp: 98.4 F (36.9 C) (04/13 0851) Temp Source: Oral (04/13 0851) BP: 115/66 (04/13 0851) Pulse Rate: 76 (04/13 0851)  Labs: Recent Labs    12/05/18 0228 12/06/18 0325 12/07/18 0348  LABPROT 27.1* 24.9* 22.5*  INR 2.6* 2.3* 2.0*    Estimated Creatinine Clearance: 53.7 mL/min (by C-G formula based on SCr of 0.86 mg/dL).  Assessment:  Anticoag: Coumadin PTA for PAF, mMVR, h/o DVT and CVA. - INR 9.1 on admit s/p vitK and FFP to 1.7. heparin d/c 4/9. SInce 4/7 when coumadin restarted she has received 0.5 -1mg /day). INR down to 2 -Home dose: 8mg /d with admit INR 9.1  Goal of Therapy:  INR 2.5-3.5 Monitor platelets by anticoagulation protocol: Yes   Plan:  -Warfarin 5mg  x 1 tonight (INR < goal, bridge?) -Converted to PO amio (on 4/14 should change to  400mg /d) -need order   Crystal S. Alford Highland, PharmD, BCPS Clinical Staff Pharmacist Eilene Ghazi Stillinger 12/07/2018,9:40 AM

## 2018-12-07 NOTE — TOC Progression Note (Signed)
Transition of Care (TOC) - Progression Note  Marvetta Gibbons RN, BSN Transitions of Care Unit 4E- RN Case Manager [phone]  Patient Details  Name: Angela [name] MRN: [id number] Date of Birth: 12-Oct-1945  Transition of Care [facility]) CM/SW Contact  Dahlia Client Romeo Rabon, [place] Phone Number: [phone] 12/07/2018, 2:42 PM  Clinical Narrative:    Pt s/p I&D on 4/4 of right buttock/perineal abscess, currently doing w-d drsg changes BID, recommendations for SNF however family declining- CM made TC to pt's spouse to further discuss recommendations and transition needs. Per discussion with spouse, he states that he is not interested in any type of rehab or post acute stay and just "wants to bring pt home where she can be with family" - discussed pt's current wound care needs and mobility needs- pt states he and son at home 24/7 and they also have other family members that can assist intermittently. Confirmed [place] agency of choice as [facility]- discussed needed DME needs- would be hospital bed, hoyer lift, BSC, transport chair. Spouse reports they have a RW at home that pt can use. Also discussed transport home- spouse has car that he feels he can transport pt home, will ask PT for their recommendation for safe transport home care vs PTAR? - Pt will need HH orders and DME placed to start process for [facility] arrangements.    Expected Discharge Plan: Skilled Nursing Facility Barriers to Discharge: Continued Medical Work up, Ship broker  Expected Discharge Plan and Services Expected Discharge Plan: [place] In-house Referral: Clinical Social Work   Post Acute Care Choice: Museum/gallery conservator, Home Health Living arrangements for the past 2 months: Single Family Home                     HH Arranged: PT, OT, Social Work [place] Agency: [facility] ([place])   Social Determinants of Health ([place]) Interventions    Readmission Risk Interventions Readmission Risk Prevention  Plan 12/05/2018 12/05/2018  Transportation Screening - Complete  PCP or Specialist Appt within 3-5 Days - Complete  HRI or Home Care Consult Complete (No Data)  Social Work Consult for [place] Planning/Counseling Complete -  [place] - Not Applicable  Medication Review Press photographer) - Complete  Some recent data might be hidden

## 2018-12-07 NOTE — Progress Notes (Signed)
[facility]/Trauma Progress Note  9 Days Post-Op   Assessment/Plan V-tach with V fib, s/o ICD shock x4  Nonischemic cardiomyopathy with EF of 15% Atrial fibrillation  Peripheral vascular disease Hx of CVA Hx of colon cancer Dementia/FTT  Right buttock/perineal abscess -s/p I&D 11/28/18 Dr. Redmond Pulling - continue BID dressing changes  FEN -reg diet VTE -SCDs, heparin gtt ID -Vanc4/2>4/6; Zosyn 4/2-09; Unasyn 04/09-04/11; Augmentin 04/08; 04/11>> and Diflucan 04/09-04/19 Follow up: TBD  Plan: continue BID dressing changes. Okay to stop Augmentin on 04/16 for a total of 2 weeks of antibiotics. We will see again on Wednesday. Please page [place] with any concerns.    LOS: 11 days    Subjective: CC: R buttock wound  No complaints. Some pain with dressing changes.   Objective: Vital signs in last 24 hours: Temp:  [98.4 F (36.9 C)-98.6 F (37 C)] 98.4 F (36.9 C) (04/13 0851) Pulse Rate:  [69-76] 76 (04/13 0851) Resp:  [13-25] 19 (04/13 0851) BP: (114-121)/(66-76) 115/66 (04/13 0851) SpO2:  [99 %-100 %] 99 % (04/13 0851) Weight:  [61.8 kg] 61.8 kg (04/13 0600) Last BM Date: 12/06/18  Intake/Output from previous day: 04/12 0701 - 04/13 0700 In: 200 [P.O.:200] Out: 1790 [Urine:1790] Intake/Output this shift: Total I/O In: 280 [P.O.:280] Out: -   PE: Gen:  Alert, NAD, pleasant, cooperative Pulm:  Rate and effort normal GU: wound is clean with a good base of granulation tissues. No purulent drainage noted. See photo below Skin:  warm and dry      Anti-infectives: Anti-infectives (From admission, onward)   Start     Dose/Rate Route Frequency Ordered Stop   12/07/18 1000  fluconazole (DIFLUCAN) tablet 200 mg    Note to Pharmacy:  Stop date 4/18   200 mg Oral Daily 12/06/18 0957 12/13/18 0959   12/05/18 2200  amoxicillin-clavulanate (AUGMENTIN) 875-125 MG per tablet 1 tablet     1 tablet Oral Every 12 hours 12/05/18 1823     12/03/18 1400   fluconazole (DIFLUCAN) IVPB 200 mg  Status:  Discontinued     200 mg 100 mL/hr over 60 Minutes Intravenous Daily 12/03/18 1216 12/06/18 0957   12/03/18 1400  Ampicillin-Sulbactam (UNASYN) 3 g in sodium chloride 0.9 % 100 mL IVPB  Status:  Discontinued     3 g 200 mL/hr over 30 Minutes Intravenous Every 6 hours 12/03/18 1303 12/05/18 1823   12/02/18 1415  piperacillin-tazobactam (ZOSYN) IVPB 3.375 g  Status:  Discontinued     3.375 g 12.5 mL/hr over 240 Minutes Intravenous Every 8 hours 12/02/18 1413 12/03/18 1217   12/02/18 1200  amoxicillin-clavulanate (AUGMENTIN) 875-125 MG per tablet 1 tablet  Status:  Discontinued     1 tablet Oral Every 12 hours 12/02/18 0904 12/02/18 1413   11/27/18 2300  vancomycin (VANCOCIN) 1,000 mg in sodium chloride 0.9 % 250 mL IVPB  Status:  Discontinued     1,000 mg 250 mL/hr over 60 Minutes Intravenous Every 24 hours 11/26/18 2336 11/30/18 2141   11/27/18 0600  piperacillin-tazobactam (ZOSYN) IVPB 3.375 g  Status:  Discontinued     3.375 g 12.5 mL/hr over 240 Minutes Intravenous Every 8 hours 11/26/18 2336 12/02/18 0904   11/26/18 2330  vancomycin (VANCOCIN) 1,250 mg in sodium chloride 0.9 % 250 mL IVPB     1,250 mg 166.7 mL/hr over 90 Minutes Intravenous STAT 11/26/18 2324 11/27/18 0345   11/26/18 2230  piperacillin-tazobactam (ZOSYN) IVPB 3.375 g     3.375 g 100 mL/hr over  30 Minutes Intravenous  Once 11/26/18 2218 11/26/18 2302      Lab Results:  No results for input(s): WBC, HGB, HCT, PLT in the last 72 hours. BMET Recent Labs    12/07/18 0942  NA 132*  K 4.3  CL 99  CO2 23  GLUCOSE 96  BUN 6*  CREATININE 0.80  CALCIUM 8.8*   PT/INR Recent Labs    12/06/18 0325 12/07/18 0348  LABPROT 24.9* 22.5*  INR 2.3* 2.0*   CMP     Component Value Date/Time   NA 132 (L) 12/07/2018 0942   K 4.3 12/07/2018 0942   CL 99 12/07/2018 0942   CO2 23 12/07/2018 0942   GLUCOSE 96 12/07/2018 0942   BUN 6 (L) 12/07/2018 0942   CREATININE 0.80  12/07/2018 0942   CALCIUM 8.8 (L) 12/07/2018 0942   PROT 7.0 11/26/2018 1614   ALBUMIN 2.7 (L) 11/26/2018 1614   AST 41 11/26/2018 1614   ALT 44 11/26/2018 1614   ALKPHOS 76 11/26/2018 1614   BILITOT 1.9 (H) 11/26/2018 1614   GFRNONAA >60 12/07/2018 0942   GFRAA >60 12/07/2018 0942   Lipase  No results found for: LIPASE  Studies/Results: No results found.    Kalman Drape , [facility] 12/07/2018, 1:06 PM  Pager: [phone] Mon-Wed, Friday 7:00am-4:30pm Thurs 7am-11:30am  Consults: [phone]

## 2018-12-07 NOTE — Discharge Instructions (Signed)
PLEASE SIGN UP TO YOUR MY CHART ACCOUNT WHEN YOU GET HOME (if you aren't already).  IN THE CURRENT ENVIRONMENT WITH COVID-19, IN EFFORT TO REDUCE YOUR EXPOSURE WE WILL BE CONDUCTING MANY PATIENT VISITS BY EITHER ELECTRONIC/VIRTUAL VISITS or TELEPHONE VISITS.  BEING SIGNED UP IN YOUR MY CHART ACCOUNT  WILL HELP FACILITATE THESE VISITS AND OUR COMMUNICATION WITH YOU   YOUR CARDIOLOGY TEAM HAS ARRANGED FOR AN E-VISIT FOR YOUR APPOINTMENT - PLEASE REVIEW IMPORTANT INFORMATION BELOW SEVERAL DAYS PRIOR TO YOUR APPOINTMENT  Due to the recent COVID-19 pandemic, we are transitioning in-person office visits to tele-medicine visits in an effort to decrease unnecessary exposure to our patients and staff. Medicare and most insurances are covering these visits without a copay needed. We also encourage you to sign up for MyChart if you have not already done so. You will need a smartphone if possible. For patients that do not have this, we can still complete the visit using a regular telephone but do prefer a smartphone to enable video when possible. You may have a close family member that lives with you that can help. If possible, we also ask that you have a blood pressure cuff and scale at home to measure your blood pressure, heart rate and weight prior to your scheduled appointment. Patients with clinical needs that need an in-person evaluation and testing will still be able to come to the office if absolutely necessary. If you have any questions, feel free to call our office.    IF YOU HAVE A SMARTPHONE, PLEASE DOWNLOAD THE WEBEX APP TO YOUR SMARTPHONE  - If Apple, go to [employer] and type in WebEx in the search bar. [place] [employer], the blue/green circle. The app is free but as with any other app download, your phone may require you to verify saved payment information or Apple password. You do NOT have to create a WebEx account.  - If Android, go to [employer] and type in [employer] in the search  bar. [place] [employer], the blue/green circle. The app is free but as with any other app download, your phone may require you to verify saved payment information or Android password. You do NOT have to create a WebEx account.  It is very helpful to have this downloaded before your visit.    2-3 DAYS BEFORE YOUR APPOINTMENT  You will receive a telephone call from one of our [place] team members - your caller ID may say "Unknown caller." If this is a video visit, we will confirm that you have been able to download the WebEx app. We will remind you check your blood pressure, heart rate and weight prior to your scheduled appointment. If you have an Apple Watch or Kardia, please upload any pertinent ECG strips the day before or morning of your appointment to [facility]. Our staff will also make sure you have reviewed the consent and agree to move forward with your scheduled tele-health visit.     THE DAY OF YOUR APPOINTMENT  Approximately 15 minutes prior to your scheduled appointment, you will receive a telephone call from one of [facility] team - your caller ID may say "Unknown caller."  Our staff will confirm medications, vital signs for the day and any symptoms you may be experiencing. Please have this information available prior to the time of visit start. It may also be helpful for you to have a pad of paper and pen handy for any instructions given during your visit. They  will also walk you through joining the WebEx smartphone meeting if this is a video visit.    CONSENT FOR TELE-HEALTH VISIT - PLEASE REVIEW  I hereby voluntarily request, consent and authorize [facility] and its employed or contracted physicians, physician assistants, nurse practitioners or other licensed health care professionals (the Practitioner), to provide me with telemedicine health care services (the Services") as deemed necessary by the treating Practitioner. I acknowledge and consent to receive the  Services by the Practitioner via telemedicine. I understand that the telemedicine visit will involve communicating with the Practitioner through live audiovisual communication technology and the disclosure of certain medical information by electronic transmission. I acknowledge that I have been given the opportunity to request an in-person assessment or other available alternative prior to the telemedicine visit and am voluntarily participating in the telemedicine visit.  I understand that I have the right to withhold or withdraw my consent to the use of telemedicine in the course of my care at any time, without affecting my right to future care or treatment, and that the Practitioner or I may terminate the telemedicine visit at any time. I understand that I have the right to inspect all information obtained and/or recorded in the course of the telemedicine visit and may receive copies of available information for a reasonable fee.  I understand that some of the potential risks of receiving the Services via telemedicine include:   Delay or interruption in medical evaluation due to technological equipment failure or disruption;  Information transmitted may not be sufficient (e.g. poor resolution of images) to allow for appropriate medical decision making by the Practitioner; and/or   In rare instances, security protocols could fail, causing a breach of personal health information.  Furthermore, I acknowledge that it is my responsibility to provide information about my medical history, conditions and care that is complete and accurate to the best of my ability. I acknowledge that Practitioner's advice, recommendations, and/or decision may be based on factors not within their control, such as incomplete or inaccurate data provided by me or distortions of diagnostic images or specimens that may result from electronic transmissions. I understand that the practice of medicine is not an exact science and that  Practitioner makes no warranties or guarantees regarding treatment outcomes. I acknowledge that I will receive a copy of this consent concurrently upon execution via email to the email address I last provided but may also request a printed copy by calling the office of [facility].    I understand that my insurance will be billed for this visit.   I have read or had this consent read to me.  I understand the contents of this consent, which adequately explains the benefits and risks of the Services being provided via telemedicine.   I have been provided ample opportunity to ask questions regarding this consent and the Services and have had my questions answered to my satisfaction.  I give my informed consent for the services to be provided through the use of telemedicine in my medical care  By participating in this telemedicine visit I agree to the above.    Information on my medicine - Coumadin   (Warfarin)  Why was Coumadin prescribed for you? Coumadin was prescribed for you because you have a blood clot or a medical condition that can cause an increased risk of forming blood clots. Blood clots can cause serious health problems by blocking the flow of blood to the heart, lung, or brain. Coumadin can prevent harmful  blood clots from forming. As a reminder your indication for Coumadin is:   Blood Clot Prevention After Heart Valve Surgery  What test will check on my response to Coumadin? While on Coumadin (warfarin) you will need to have an INR test regularly to ensure that your dose is keeping you in the desired range. The INR (international normalized ratio) number is calculated from the result of the laboratory test called prothrombin time (PT).  If an INR APPOINTMENT HAS NOT ALREADY BEEN MADE FOR YOU please schedule an appointment to have this lab work done by your health care provider within 7 days. Your INR goal is usually a number between:  2 to 3 or your provider may give you a  more narrow range like 2-2.5.  Ask your health care provider during an office visit what your goal INR is.  What  do you need to  know  About  COUMADIN? Take Coumadin (warfarin) exactly as prescribed by your healthcare provider about the same time each day.  DO NOT stop taking without talking to the doctor who prescribed the medication.  Stopping without other blood clot prevention medication to take the place of Coumadin may increase your risk of developing a new clot or stroke.  Get refills before you run out.  What do you do if you miss a dose? If you miss a dose, take it as soon as you remember on the same day then continue your regularly scheduled regimen the next day.  Do not take two doses of Coumadin at the same time.  Important Safety Information A possible side effect of Coumadin (Warfarin) is an increased risk of bleeding. You should call your healthcare provider right away if you experience any of the following: ? Bleeding from an injury or your nose that does not stop. ? Unusual colored urine (red or dark brown) or unusual colored stools (red or black). ? Unusual bruising for unknown reasons. ? A serious fall or if you hit your head (even if there is no bleeding).  Some foods or medicines interact with Coumadin (warfarin) and might alter your response to warfarin. To help avoid this: ? Eat a balanced diet, maintaining a consistent amount of Vitamin K. ? Notify your provider about major diet changes you plan to make. ? Avoid alcohol or limit your intake to 1 drink for women and 2 drinks for men per day. (1 drink is 5 oz. wine, 12 oz. beer, or 1.5 oz. liquor.)  Make sure that ANY health care provider who prescribes medication for you knows that you are taking Coumadin (warfarin).  Also make sure the healthcare provider who is monitoring your Coumadin knows when you have started a new medication including herbals and non-prescription products.  Coumadin (Warfarin)  Major Drug  Interactions  Increased Warfarin Effect Decreased Warfarin Effect  Alcohol (large quantities) Antibiotics (esp. Septra/Bactrim, Flagyl, Cipro) Amiodarone (Cordarone) Aspirin (ASA) Cimetidine (Tagamet) Megestrol (Megace) NSAIDs (ibuprofen, naproxen, etc.) Piroxicam (Feldene) Propafenone (Rythmol SR) Propranolol (Inderal) Isoniazid (INH) Posaconazole (Noxafil) Barbiturates (Phenobarbital) Carbamazepine (Tegretol) Chlordiazepoxide (Librium) Cholestyramine (Questran) Griseofulvin Oral Contraceptives Rifampin Sucralfate (Carafate) Vitamin K   Coumadin (Warfarin) Major Herbal Interactions  Increased Warfarin Effect Decreased Warfarin Effect  Garlic Ginseng Ginkgo biloba Coenzyme Q10 Green tea St. Johns wort    Coumadin (Warfarin) FOOD Interactions  Eat a consistent number of servings per week of foods HIGH in Vitamin K (1 serving =  cup)  Collards (cooked, or boiled & drained) Kale (cooked, or boiled & drained) Mustard greens (  cooked, or boiled & drained) Parsley *serving size only =  cup Spinach (cooked, or boiled & drained) Swiss chard (cooked, or boiled & drained) Turnip greens (cooked, or boiled & drained)  Eat a consistent number of servings per week of foods MEDIUM-HIGH in Vitamin K (1 serving = 1 cup)  Asparagus (cooked, or boiled & drained) Broccoli (cooked, boiled & drained, or raw & chopped) Brussel sprouts (cooked, or boiled & drained) *serving size only =  cup Lettuce, raw (green leaf, endive, romaine) Spinach, raw Turnip greens, raw & chopped   These websites have more information on Coumadin (warfarin):  FailFactory.se; VeganReport.com.au;

## 2018-12-07 NOTE — Progress Notes (Signed)
PROGRESS NOTE    Angela [name]  QQI:[id number]  DOB: 01/02/1946  DOA: 11/26/2018 PCP: Lorene Dy, MD  Brief Narrative: 73 year old female with history of nonischemic cardiomyopathy, chronic systolic CHF, EF 15 to 94%, s/p mechanical MVRon chronic Coumadin, Hx of Vfib arrest,LBBBS/p St Judebiventricular ICD placement, atrial fibrillation, and memory deficits brought into the hospital (from primary surgeons office where she was being evaluated for perirectal infection/CT showing foci of gas) after she had a syncopal episode related to V. tach/V. fib causing defibrillation through her ICD.  ICD interrogation revealed multiple episodes of NSVT.  She was on IV amiodarone while in ICU now transition to oral.  Patient had supratherapeutic INR on presentation and required vitamin K/FFP which was subsequently transitioned to heparin drip while awaiting surgical intervention.  Patient now status post drainage of perineal abscess on April 4.  She remains on IV antibiotics. Patient is now having diarrhea (?  Related to stool softeners) has Flexi-Seal in place and is dependent on nursing for perineal care.  Physical therapy recommending skilled nursing facility placement.  ?Palliative care evaluation is pending.She  lives with her husband and son.    Subjective:  Patient is awake alert and oriented x2.  She remains disoriented to time. She does not recollect the syncopal or defibrillation episode. Off rectal tube and foley catheter replaced 4/12.   Objective: Vitals:   12/06/18 1718 12/06/18 1933 12/07/18 0600 12/07/18 0851  BP: 114/72 121/76  115/66  Pulse: 69 73  76  Resp: (!) 22 13  19   Temp: 98.4 F (36.9 C) 98.4 F (36.9 C) 98.6 F (37 C) 98.4 F (36.9 C)  TempSrc: Oral Oral Oral Oral  SpO2: 100% 100%  99%  Weight:   61.8 kg   Height:        Intake/Output Summary (Last 24 hours) at 12/07/2018 0912 Last data filed at 12/07/2018 0500 Gross per 24 hour  Intake 100 ml  Output 1790 ml   Net -1690 ml   Filed Weights   12/05/18 0415 12/06/18 0431 12/07/18 0600  Weight: 61.4 kg 62.3 kg 61.8 kg    Physical Examination:  General exam: Appears calm and comfortable  Respiratory system: Clear to auscultation. Respiratory effort normal. Cardiovascular system: S1 & S2 heard, status post AICD. No JVD, murmurs, rubs, gallops or clicks. No pedal edema. Gastrointestinal system: Abdomen is nondistended, soft and nontender. No organomegaly or masses felt. Normal bowel sounds heard. Central nervous system: Alert and orientedx2. No focal neurological deficits. Extremities: Symmetric 5 x 5 power. Skin: Wound appearance as below. Dry dressing/kerlix at perineal wound site. Psychiatry: calm, memory deficits      Data Reviewed: I have personally reviewed following labs and imaging studies  CBC: Recent Labs  Lab 12/01/18 0754 12/02/18 0248 12/03/18 0343 12/04/18 0513  WBC 10.7* 10.5 8.1 6.3  NEUTROABS 9.2*  --   --   --   HGB 12.8 12.9 11.3* 11.2*  HCT 38.6 39.6 35.9* 36.1  MCV 88.3 88.8 88.0 90.7  PLT 278 292 270 174   Basic Metabolic Panel: Recent Labs  Lab 12/01/18 0754 12/02/18 0248 12/04/18 0513  NA 135 132* 133*  K 4.7 5.1 4.1  CL 102 103 98  CO2 22 20* 25  GLUCOSE 97 86 102*  BUN 10 10 9   CREATININE 1.01* 1.03* 0.86  CALCIUM 8.8* 8.7* 8.6*  MG 1.9  --   --    GFR: Estimated Creatinine Clearance: 53.7 mL/min (by C-G formula based on SCr of 0.86  mg/dL). Liver Function Tests: No results for input(s): AST, ALT, ALKPHOS, BILITOT, PROT, ALBUMIN in the last 168 hours. No results for input(s): LIPASE, AMYLASE in the last 168 hours. No results for input(s): AMMONIA in the last 168 hours. Coagulation Profile: Recent Labs  Lab 12/03/18 0343 12/04/18 0513 12/05/18 0228 12/06/18 0325 12/07/18 0348  INR 2.7* 2.4* 2.6* 2.3* 2.0*   Cardiac Enzymes: No results for input(s): CKTOTAL, CKMB, CKMBINDEX, TROPONINI in the last 168 hours. BNP (last 3 results) No  results for input(s): PROBNP in the last 8760 hours. HbA1C: No results for input(s): HGBA1C in the last 72 hours. CBG: Recent Labs  Lab 12/06/18 1612 12/06/18 2052 12/07/18 0024 12/07/18 0558 12/07/18 0755  GLUCAP 96 110* 92 88 85   Lipid Profile: No results for input(s): CHOL, HDL, LDLCALC, TRIG, CHOLHDL, LDLDIRECT in the last 72 hours. Thyroid Function Tests: No results for input(s): TSH, T4TOTAL, FREET4, T3FREE, THYROIDAB in the last 72 hours. Anemia Panel: No results for input(s): VITAMINB12, FOLATE, FERRITIN, TIBC, IRON, RETICCTPCT in the last 72 hours. Sepsis Labs: No results for input(s): PROCALCITON, LATICACIDVEN in the last 168 hours.  Recent Results (from the past 240 hour(s))  Aerobic/Anaerobic Culture (surgical/deep wound)     Status: None   Collection Time: 11/28/18 10:32 AM  Result Value Ref Range Status   Specimen Description ABSCESS BUTTOCKS  Final   Special Requests PATIENT ON FOLLOWING VANC AND ZOSYN  Final   Gram Stain   Final    NO WBC SEEN RARE GRAM POSITIVE COCCI IN PAIRS RARE GRAM POSITIVE RODS Performed at [facility], [address], [place], [place] [address]    Culture   Final    RARE ESCHERICHIA COLI RARE CANDIDA ALBICANS MIXED ANAEROBIC FLORA PRESENT.  CALL LAB IF FURTHER IID REQUIRED.    Report Status 12/02/2018 FINAL  Final   Organism ID, Bacteria ESCHERICHIA COLI  Final      Susceptibility   Escherichia coli - MIC*    AMPICILLIN <=2 SENSITIVE Sensitive     CEFAZOLIN <=4 SENSITIVE Sensitive     CEFEPIME <=1 SENSITIVE Sensitive     CEFTAZIDIME <=1 SENSITIVE Sensitive     CEFTRIAXONE <=1 SENSITIVE Sensitive     CIPROFLOXACIN <=0.25 SENSITIVE Sensitive     GENTAMICIN <=1 SENSITIVE Sensitive     IMIPENEM <=0.25 SENSITIVE Sensitive     TRIMETH/SULFA <=20 SENSITIVE Sensitive     AMPICILLIN/SULBACTAM <=2 SENSITIVE Sensitive     PIP/TAZO <=4 SENSITIVE Sensitive     Extended ESBL NEGATIVE Sensitive     * RARE ESCHERICHIA COLI       Radiology Studies: No results found.      Scheduled Meds: . amiodarone  400 mg Oral BID  . amoxicillin-clavulanate  1 tablet Oral Q12H  . Chlorhexidine Gluconate Cloth  6 each Topical Q0600  . feeding supplement (ENSURE ENLIVE)  237 mL Oral TID BM  . feeding supplement (PRO-STAT SUGAR FREE 64)  30 mL Oral BID  . fluconazole  200 mg Oral Daily  . insulin aspart  0-9 Units Subcutaneous Q4H  . multivitamin with minerals  1 tablet Oral Daily  . saccharomyces boulardii  250 mg Oral BID  . sodium chloride flush  3 mL Intravenous Q12H  . Warfarin - Pharmacist Dosing Inpatient   Does not apply q1800   Continuous Infusions:   Assessment & Plan:    1. Right perineal/perirectal abscesswith presence of gas/Fournier's gangrene: Status post debridement by surgical service on 4/4. Patient initially was  receiving IV vancomycin/Zosyn. Blood cultures negative so far. MRSA screening negative, wound culture preliminary  result with pansensitive ecoli, rare candida and mixed anaerobic flora-  Per ID phone consultation, 2 weeks of total antibiotics, currently on Augmentin (unasyn changed to po) and Diflucan (planned through April 18). Wet to dry dressing changes BID , may need hydrotherapy per surgery.  2.Syncope/arrhythmia: Present on admission secondary to V. tach/V. fib status post ICD defibrillation x5.  Off lidocaine/amiodarone drip and now on oral amiodarone.  Seen by EP/cardiology initially, now signed off. Had NSVT episode on 4/9. Check electrolytes again including mag.   3.  Atrial fibrillation with sick sinus syndrome: Status post pacemaker.  Supratherapeutic INR on presentation which was reversed with vitamin K/FFP.  Been on IV heparin perioperatively.  Coumadin resumed on April 7.  INR therapeutic now and off Heparin.   4.  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  Currently Coumadin resumed and  therapeutic.  5. Nonischemic  cardiomyopathy/ EF 15% / sp biVICD : Recent hospitalization for heart failure from 3/2 to 3/4. currently euvolemic and possibly dry with diarrhea.  -closely monitor volume status, cardiology has signed off  6.  Diarrhea: In the setting of laxatives (started by general surgery initially in concern for constipation).  KUB on April 6 unremarkable.  D/Ced laxatives and showing improvement.  DC rectal tube possibly today as d/w nurse. Continue Florastor as patient on abx  7. Urinary retention: Foley catheter placed in earlier hospital course for retention as well as to avoid wound decontamination. Patient may have voiding trial once perineal would adequately healing.   8.Dementia/FTT -she had h/o colon cancer, she has h/o gi bleed, palliative care involved in 04/2017, family was not receptive to palliative care at the time per chart review.  Husband and son discussed with hospitalist on April 9, plan to meet with palliative care team to address care goals. I do not see a consult request in Epic, placed new request today.     DVT prophylaxis: On anticoagulation Code Status: Full code Family / Patient Communication: Lives with her son and husband .Palliative care meeting for goals of care pending.  Disposition Plan: Skilled nursing facility per PT recommendations. CM looking into options.    LOS: 11 days    Time spent: 25 minutes    Guilford Shi, MD [facility] Pager [phone]  If 7PM-7AM, please contact night-coverage [email or web address] Password [facility] 12/07/2018, 9:12 AM

## 2018-12-07 NOTE — Consult Note (Signed)
Consultation Note Date: 12/07/2018   Patient Name: Angela [name]  DOB: 10/16/1945  MRN: [id number]  Age / Sex: 73 y.o., female  PCP: Angela Dy, MD Referring Physician: Guilford Shi, MD  Reason for Consultation: Establishing goals of care  HPI/Patient Profile: 73 y.o. female  with past medical history of NICM s/p ICD, systolic CHF EF 53-29%, a fib, mechanical mitral valve on coumadin, colon cancer, diverticulosis, v fib arrest, and dementia admitted on 11/26/2018 after a syncopal episode with firing af ICD at her surgeon's office where she was being evaluated for perirectal infection. Patient had perineal abscess drained April 4th is receiving 2 weeks of antibiotics. She has also had at least one other  V tach episode during hospitalization. PT is recommending SNF; however, husband requests patient come home. PMT consulted for [place].  Clinical Assessment and Goals of Care:  I have reviewed medical records including EPIC notes, labs and imaging, received report from RN, and then spoke with patient's son and husband  to discuss diagnosis prognosis, GOC, EOL wishes, disposition and options.  I introduced Palliative Medicine as specialized medical care for people living with serious illness. It focuses on providing relief from the symptoms and stress of a serious illness. The goal is to improve quality of life for both the patient and the family.  We discussed a brief life review of the patient. They describe patient as "strong-willed". They tell me she enjoys going to bible study.   As far as functional and nutritional status, they tell me prior to admission she was ambulating with a cane. She could take herself to the restroom but required assistance with other ADLs. She required assistance with eating. They speak of poor appetite at home - they were supplementing her diet with Breeze.    We discussed her current illness and what it means in the  larger context of her on-going co-morbidities. We discussed her perineal wound and the extent/severity of it. I attempted to describe wound in detail since family has not been at bedside so that they understand what they will be caring for at home. We also discussed patient's multiple cardiac concerns. We discussed her dementia and the progressive nature of dementia. Discussed patient's poor nutrition/lack of desire to eat - discussed that this is also part of dementia process and discussed how malnutrition affects the wound healing process.   I attempted to elicit values and goals of care important to the patient.  They both share that they do not want the patient to suffer.   The difference between aggressive medical intervention and comfort care was considered in light of the patient's goals of care. We discussed that the patient is currently receiving aggressive medical care. We discussed what a comfort path would look like for this patient. At this time, family is interested in continuing aggressive medical care.   Advance directives, concepts specific to code status, artifical feeding and hydration, and rehospitalization were considered and discussed. We discussed code status - family tells me at this time they would like patient to remain a full code - however, they are not opposed to DNR status is patient were to worsen. I shared my concerns about patient's frailty and recommendation for DNR status. They tell me they would never want patient on prolonged life support. They share if patient continues to decline they would likely change code status to DNR; however, at this time their main focus is getting her home as they feel that she will eat better at home  with them.  We also discussed artificial feeding and hydration - I shared with them concern that feeding/hydration issues will continue d/t her dementia/failure to thrive. I shared recommendation against artificial nutrition in the setting of  dementia. They agree with this and share that they have discussed this with patient before and she would not want a feeding tube. We discussed comfort feeding/careful hand feeding - focusing on comfort instead of force feeding if patient continues to decline.   Hospice and Palliative Care services outpatient were explained and offered. Family agrees to outpatient palliative care after discharge.   Questions and concerns were addressed. The family was encouraged to call with questions or concerns.    Primary Decision Maker NEXT OF KIN - spouse Angela [name]  SUMMARY OF RECOMMENDATIONS   - at this time, family's main focus is getting patient home as they think she will eat much better at home - educated about failure to thrive/dementia disease process - family would like patient to remain full code at this time - NO feeding tube per family/patient wishes - agreeable to outpatient palliative follow-up **discharging MD please write for outpatient palliative in discharge summary**  Code Status/Advance Care Planning:  Full code  Additional Recommendations (Limitations, Scope, Preferences):  No Artificial Feeding and No Tracheostomy  Prognosis:   Unable to determine  Discharge Planning: Home with Home Health and outpatient palliative      Primary Diagnoses: Present on Admission: . Syncope . Ventricular fibrillation ([place]) . Systolic CHF, chronic ([place]) . ICD (implantable cardioverter-defibrillator), biventricular, in situ . Elevated troponin . Elevated INR . Fournier's gangrene in female   I have reviewed the medical record, interviewed the patient and family, and examined the patient. The following aspects are pertinent.  Past Medical History:  Diagnosis Date  . Abnormality of gait 08/23/2014  . Arthritis   . Atrial fibrillation ([place])    A.  Chronic Coumadin  . Cardiac arrest - ventricular fibrillation    A.  12/2007;  B. 01/2008 St. Jude Promote Bi-V ICD placed  . CVA  (cerebral vascular accident) ([place])   . Diverticular disease   . DVT (deep venous thrombosis) ([place])   . 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 History of colon cancer    A.  1999 - T3, N1  chemotherapy  . Internal hemorrhoids   . Memory deficit 08/23/2014  . Nonischemic cardiomyopathy ([place])    a.  11/2001 - Cath - NL Cors; b. ECHO 09/22/11: EF 20%, MVR normal, moderate LAE, mild RAE c. ECHO (08/2012): ED 20%, diff HK, LA mod dilated, mild/mod TR, RV mild/mod decreased sys fx  . Rectal bleeding 04/28/2017  . Rheumatic heart disease    A. 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 Sick sinus syndrome ([place])    A.  s/p pacer in 1995.  B.    . Systolic CHF, chronic ([place])    A.  01/2008 Echo - EF 10-20%   Social History   Socioeconomic History  . Marital status: Married    Spouse name: Not on file  . Number of children: 1  . Years of education: Not on file  . Highest education level: Not on file  Occupational History  . Occupation: Retired    Fish farm manager: RETIRED  Social Needs  . Financial resource strain: Not on file  . Food insecurity:    Worry: Not on file    Inability: Not on  file  . Transportation needs:    Medical: Not on file    Non-medical: Not on file  Tobacco Use  . Smoking status: Never Smoker  . Smokeless tobacco: Never Used  Substance and Sexual Activity  . Alcohol use: No  . Drug use: No  . Sexual activity: Never  Lifestyle  . Physical activity:    Days per week: Not on file    Minutes per session: Not on file  . Stress: Not on file  Relationships  . Social connections:    Talks on phone: Not on file    Gets together: Not on file    Attends religious service: Not on file    Active member of club or organization: Not on file    Attends meetings of clubs or organizations: Not on file    Relationship status: Not on file  Other Topics Concern  . Not on file  Social History Narrative   The patient is married  and has 1 child.  Lives in [place] with husband.  She is retired from working at [employer].  She does not smoke or drink   Family History  Problem Relation Age of Onset  . Diabetes Mother 56  . Coronary artery disease Mother        s/p cabg  . Lung cancer Father 72       smoker  . Coronary artery disease Sister        s/p cabg   Scheduled Meds: . amiodarone  400 mg Oral BID  . amoxicillin-clavulanate  1 tablet Oral Q12H  . Chlorhexidine Gluconate Cloth  6 each Topical Q0600  . feeding supplement (ENSURE ENLIVE)  237 mL Oral TID BM  . feeding supplement (PRO-STAT SUGAR FREE 64)  30 mL Oral BID  . fluconazole  200 mg Oral Daily  . insulin aspart  0-9 Units Subcutaneous Q4H  . multivitamin with minerals  1 tablet Oral Daily  . saccharomyces boulardii  250 mg Oral BID  . sodium chloride flush  3 mL Intravenous Q12H  . warfarin  5 mg Oral ONCE-1800  . Warfarin - Pharmacist Dosing Inpatient   Does not apply q1800   Continuous Infusions: PRN Meds:.acetaminophen **OR** acetaminophen, lip balm, morphine injection, ondansetron (ZOFRAN) IV, oxyCODONE No Known Allergies  Vital Signs: BP 115/66 (BP Location: Left Arm)   Pulse 76   Temp 98.4 F (36.9 C) (Oral)   Resp 19   Ht 5\' 6"  (1.676 m)   Wt 61.8 kg   SpO2 99%   BMI 21.99 kg/m  Pain Scale: 0-10 POSS *See Group Information*: 2-Acceptable,Slightly drowsy, easily aroused Pain Score: 10-Worst pain ever   SpO2: SpO2: 99 % O2 Device:SpO2: 99 % O2 Flow Rate: .O2 Flow Rate (L/min): 2 L/min  IO: Intake/output summary:   Intake/Output Summary (Last 24 hours) at 12/07/2018 1639 Last data filed at 12/07/2018 1300 Gross per 24 hour  Intake 500 ml  Output 1390 ml  Net -890 ml    LBM: Last BM Date: 12/06/18 Baseline Weight: Weight: 60.7 kg Most recent weight: Weight: 61.8 kg     Palliative Assessment/Data: PPS 20%    The above conversation was completed via telephone due to the visitor restrictions during the COVID-19  pandemic. Thorough chart review and discussion with necessary members of the care team was completed as part of assessment. All issues were discussed and addressed but no physical exam was performed.  Time Total: 70 minutes Greater than 50%  of this time  was spent counseling and coordinating care related to the above assessment and plan.  Juel Burrow, DNP, AGNP-C Palliative Medicine Team [phone] Pager: [phone]

## 2018-12-08 ENCOUNTER — Other Ambulatory Visit: Payer: Self-pay

## 2018-12-08 DIAGNOSIS — E43 Unspecified severe protein-calorie malnutrition: Secondary | ICD-10-CM

## 2018-12-08 LAB — GLUCOSE, CAPILLARY
Glucose-Capillary: 103 mg/dL — ABNORMAL HIGH (ref 70–99)
Glucose-Capillary: 90 mg/dL (ref 70–99)
Glucose-Capillary: 126 mg/dL — ABNORMAL HIGH (ref 70–99)
Glucose-Capillary: 116 mg/dL — ABNORMAL HIGH (ref 70–99)

## 2018-12-08 LAB — PROTIME-INR
INR: 2.1 — ABNORMAL HIGH (ref 0.8–1.2)
Prothrombin Time: 23 seconds — ABNORMAL HIGH (ref 11.4–15.2)

## 2018-12-08 MED ORDER — AMIODARONE HCL 200 MG PO TABS: 400.0000 mg | ORAL_TABLET | Freq: Every day | ORAL | 0 refills | Status: DC

## 2018-12-08 MED ORDER — FLUCONAZOLE 200 MG PO TABS: 200.0000 mg | ORAL_TABLET | Freq: Every day | ORAL | 0 refills | Status: AC

## 2018-12-08 MED ORDER — FLUCONAZOLE 200 MG PO TABS: 200.0000 mg | ORAL_TABLET | Freq: Every day | ORAL | 0 refills | Status: DC

## 2018-12-08 MED ORDER — OXYCODONE HCL 5 MG PO TABS: 5.0000 mg | ORAL_TABLET | Freq: Four times a day (QID) | ORAL | 0 refills | Status: DC | PRN

## 2018-12-08 MED ORDER — WARFARIN SODIUM 5 MG PO TABS
5.0000 mg | ORAL_TABLET | Freq: Once | ORAL | Status: AC
  Administered 2018-12-08: 5 mg via ORAL
  Filled 2018-12-08: qty 1

## 2018-12-08 MED ORDER — FUROSEMIDE 40 MG PO TABS: 20.0000 mg | ORAL_TABLET | Freq: Every day | ORAL | Status: DC | PRN

## 2018-12-08 MED ORDER — AMOXICILLIN-POT CLAVULANATE 875-125 MG PO TABS: 1.0000 | ORAL_TABLET | Freq: Two times a day (BID) | ORAL | 0 refills | Status: AC

## 2018-12-08 MED ORDER — AMOXICILLIN-POT CLAVULANATE 875-125 MG PO TABS: 1.0000 | ORAL_TABLET | Freq: Two times a day (BID) | ORAL | 0 refills | Status: DC

## 2018-12-08 NOTE — Progress Notes (Signed)
PIV discontinued, cath tip intact. Pressure applied till hemostasis obtained. Tele discontinued, CCMD called and notified pt is going home. Pt's PM medication administered. Pt was discharged home via [facility] ambulance in stretcher. Pt's belongings send with pt.

## 2018-12-08 NOTE — Progress Notes (Signed)
Physical Therapy Treatment Patient Details Name: Angela [name] MRN: [id number] DOB: 18-Nov-1945 Today's Date: 12/08/2018    History of Present Illness Pt is a 73 y.o. female admitted from surgeon's office (there fore for evaluation of perirectal infection) on 11/26/18 after syncopal episode; found to be in V. tach which degenerated into v-fib requiring shock therapy from her ICD with multiple episodes of NSVT (per ICD interrogation). Now s/p I&D of perineal abscess on 4/4. PMH includes HF, NICM, s/p MVR, s/p ICD, afib, memory deficit.    PT Comments    Pt no longer has rectal tube in place and was found soiled in bed on entry. Pt requires modA for rolling onto side for pericare. Sacral wound dressing found to be soiled as well and was removed. Pt was cleaned and clean dressing applied. Pt unable to participate in additional therapy due to pain. Pt to d/c home with family, PT continues to recommend SNF level care given mobility and wound care. With d/c home pt will require hospital bed, hoyerlift and wheelchair for mobility as well as extensive guidance in dressing pt wound as it is easily soiled with stooling. Pt to d/c home today.     Follow Up Recommendations  SNF;Supervision/Assistance - 24 hour     Equipment Recommendations  (TBD)       Precautions / Restrictions Precautions Precautions: Fall Precaution Comments: R buttock/perineal wound Restrictions Weight Bearing Restrictions: No    Mobility  Bed Mobility Overal bed mobility: Needs Assistance Bed Mobility: Supine to Sit Rolling: Mod assist                     Cognition Arousal/Alertness: Awake/alert Behavior During Therapy: Flat affect Overall Cognitive Status: History of cognitive impairments - at baseline Area of Impairment: Orientation;Attention;Memory;Following commands;Safety/judgement;Awareness;Problem solving                 Orientation Level: Disoriented to;Place;Time;Situation Current Attention  Level: Sustained Memory: Decreased short-term memory Following Commands: Follows one step commands inconsistently;Follows one step commands with increased time Safety/Judgement: Decreased awareness of deficits Awareness: Intellectual Problem Solving: Slow processing;Decreased initiation;Difficulty sequencing;Requires verbal cues               Pertinent Vitals/Pain Pain Assessment: Faces Faces Pain Scale: Hurts whole lot Pain Location: bottom with cleaning Pain Descriptors / Indicators: Grimacing Pain Intervention(s): Limited activity within patient's tolerance;Monitored during session;Repositioned    Home Living Family/patient expects to be discharged to:: Private residence                        PT Goals (current goals can now be found in the care plan section) Acute Rehab PT Goals Patient Stated Goal: none stated PT Goal Formulation: Patient unable to participate in goal setting Time For Goal Achievement: 12/14/18 Potential to Achieve Goals: Poor Progress towards PT goals: Progressing toward goals    Frequency    Min 2X/week      PT Plan Current plan remains appropriate       AM-PAC PT "6 Clicks" Mobility   Outcome Measure  Help needed turning from your back to your side while in a flat bed without using bedrails?: A Lot Help needed moving from lying on your back to sitting on the side of a flat bed without using bedrails?: A Lot Help needed moving to and from a bed to a chair (including a wheelchair)?: A Lot Help needed standing up from a chair using your arms (e.g., wheelchair or bedside  chair)?: A Lot Help needed to walk in hospital room?: Total Help needed climbing 3-5 steps with a railing? : Total 6 Click Score: 10    End of Session   Activity Tolerance: Patient limited by pain Patient left: in bed;with call bell/phone within reach;with bed alarm set Nurse Communication: Mobility status PT Visit Diagnosis: Difficulty in walking, not elsewhere  classified (R26.2);Pain Pain - part of body: (bottom)     Time: 7943-2761 PT Time Calculation (min) (ACUTE ONLY): 26 min  Charges:  $Therapeutic Activity: 23-37 mins                     Elizabeth B. Migdalia Dk PT, DPT Acute Rehabilitation Services Pager [phone] Office [phone]    [place] 12/08/2018, 5:13 PM

## 2018-12-08 NOTE — Progress Notes (Signed)
Right buttock/perineal abscess -s/p I&D 11/28/18 -   Durable Medical Equipment (From admission, onward)       Start     Ordered  12/07/18 1630   For home use only DME Bedside commode  Once   Question:  Patient needs a bedside commode to treat with the following condition  Answer:  Muscular deconditioning  12/07/18 1629  12/07/18 1628   For home use only DME Hospital bed  Once   Question Answer Comment Patient has (list medical condition): perirectal post surgical complex wound  The above medical condition requires: Patient requires the ability to reposition frequently  Bed type Semi-electric  Hoyer Lift Yes    12/07/18 1629

## 2018-12-08 NOTE — Care Management Important Message (Signed)
Important Message  Patient Details  Name: Angela [name] MRN: [id number] Date of Birth: March 22, 1946   Medicare Important Message Given:  Yes    Orbie Pyo 12/08/2018, 4:22 PM

## 2018-12-08 NOTE — Progress Notes (Signed)
ANTICOAGULATION CONSULT NOTE - Follow Up Consult  Pharmacy Consult for Coumadin Indication: PAF, mMVR, h/o DVT and CVA  No Known Allergies  Patient Measurements: Height: 5\' 6"  (167.6 cm) Weight: 131 lb 13.4 oz (59.8 kg) IBW/kg (Calculated) : 59.3  Vital Signs: Temp: 98.4 F (36.9 C) (04/14 0757) Temp Source: Oral (04/14 0757) BP: 99/65 (04/14 0757) Pulse Rate: 72 (04/14 0757)  Labs: Recent Labs    12/06/18 0325 12/07/18 0348 12/07/18 0942 12/08/18 0430  LABPROT 24.9* 22.5*  --  23.0*  INR 2.3* 2.0*  --  2.1*  CREATININE  --   --  0.80  --     Estimated Creatinine Clearance: 57.8 mL/min (by C-G formula based on SCr of 0.8 mg/dL).  Assessment:  Anticoag: Coumadin PTA for PAF, mMVR, h/o DVT and CVA. - INR 9.1 on admit s/p vitK and FFP to 1.7. heparin d/c 4/9. SInce 4/7 when coumadin restarted she has received 0.5 -1mg /day). INR 2>2.1 rosY -Home dose: 8mg /d with admit INR 9.1  Goal of Therapy:  INR 2.5-3.5 Monitor platelets by anticoagulation protocol: Yes   Plan:  -Warfarin 5mg  x 1 tonight (INR < goal, bridge?) -Converted to PO amio (on 4/14 should change to  400mg /d) -need order   Crystal S. Alford Highland, PharmD, BCPS Clinical Staff Pharmacist Eilene Ghazi Stillinger 12/08/2018,9:27 AM

## 2018-12-08 NOTE — Progress Notes (Signed)
Patient in a stable condition, discharge, discharge education reviewed with patient she verbalized understanding, printed AVS given to patient and another copy placed in the packet for PTAR, patient belongings at bedside, awaiting call from patient's family about delivery of DME.

## 2018-12-08 NOTE — Discharge Summary (Signed)
Physician Discharge Summary  Angela [name] ELF:[id number] DOB: 1946/07/16 DOA: 11/26/2018  PCP: Lorene Dy, MD  Admit date: 11/26/2018 Discharge date: 12/08/2018  Time spent: 35 minutes  Recommendations for Outpatient Follow-up:  PCP Dr. Mancel Bale in 1 week, please continue ongoing goals of care discussions poor long-term prognosis Palliative care follow-up at home EP Dr.Taylor in 2 weeks  Discharge Diagnoses:  Principal Problem:   Fournier's gangrene in female   Dementia   Systolic CHF, chronic (HCC)   Ischemic cardiomyopathy, EF of 15%   Ventricular fibrillation (HCC)   S/P Mechanical MVR (mitral valve replacement)   ICD (implantable cardioverter-defibrillator), biventricular, in situ   Elevated troponin   Syncope   Ventricular tachycardia (HCC)   Elevated INR   Goals of care, counseling/discussion   Discharge Condition: Stable but guarded prognosis  Diet recommendation: Low-sodium, heart healthy  Filed Weights   12/06/18 0431 12/07/18 0600 12/08/18 0437  Weight: 62.3 kg 61.8 kg 59.8 kg    History of present illness: This is a 73 year old female with history of dementia, nonischemic cardiomyopathy, EF of 15%, mechanical MVR on chronic Coumadin, history of V. fib arrest, by V ICD, A. fib was brought to the emergency room from a surgeon's office where she was being evaluated for perirectal infection. -She had a syncopal episode related to VT/V. fib detected on ICD interrogation which also revealed multiple episodes of NSVT was admitted to the ICU  Hospital Course:   1. Right perineal/perirectal abscesswith presence of gas/Fournier's gangrene:  -Status post debridement by surgical service on 4/4.  -Initially treated with IV vancomycin and Zosyn, blood cultures are negative, MRSA screening was negative wound culture grew pansensitive E. coli, rare Candida and mixed anaerobic flora  -Case was discussed with infectious disease via phone consultation, total 2-week course of  antibiotics was recommended currently on Augmentin , 4 more days at discharge  -Also discharged on Diflucan to complete a 10-day course through 4/18  -She is recommended to have wet-to-dry dressing changes twice daily  -discharged home with Home health services  2. Syncope/arrhythmia: Present on admission secondary to V. tach/V. fib status post ICD defibrillation x5.  Off lidocaine/amiodarone drip and now on oral amiodarone.  Seen by EP/cardiology initially, now signed off. -Fu with EP Dr.Taylor  3.  Atrial fibrillation with sick sinus syndrome: Status post pacemaker. -she was on heparin originally and then Coumadin resumed on April 7.  INR therapeutic now and off Heparin.   4.  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  Currently Coumadin resumed and  therapeutic.  5. Nonischemic cardiomyopathy/EF 15% / spbiVICD : Recent hospitalization for heart failure from 3/2 to 3/4. currently euvolemic and possibly dry with diarrhea.  -closely monitor volume status, cardiology has signed off -lasix PRN   6.Dementia/FTT -she had h/o colon cancer, she has h/o gi bleed, palliative care involved in 04/2017, family was not receptive to palliative care at the time per chart review.  -Palliative consulted: s/p family meeting SUMMARY OF RECOMMENDATIONS   - at this time, family's main focus is getting patient home as they think she will eat much better at home - educated about failure to thrive/dementia disease process - family would like patient to remain full code at this time - NO feeding tube per family/patient wishes - agreeable to outpatient palliative follow-up   7. Severe protein calorie malnutrition -supplements advised     Procedures: status post drainage of perineal abscess on April 4.  Consultations: -General surgery Palliative medicine  Discharge Exam: Vitals:   12/08/18 0437 12/08/18 0757  BP: 109/66 99/65   Pulse: 73 72  Resp: 19 11  Temp: 98.4 F (36.9 C) 98.4 F (36.9 C)  SpO2: 98% 98%    General: AAOx3 Cardiovascular: S1S2/RRR Respiratory: CTAB  Discharge Instructions   Discharge Instructions    Diet - low sodium heart healthy   Complete by:  As directed    Increase activity slowly   Complete by:  As directed      Allergies as of 12/08/2018   No Known Allergies     Medication List    STOP taking these medications   cephALEXin 500 MG capsule Commonly known as:  KEFLEX   digoxin 0.125 MG tablet Commonly known as:  LANOXIN   lisinopril 2.5 MG tablet Commonly known as:  PRINIVIL,ZESTRIL   tiZANidine 4 MG tablet Commonly known as:  ZANAFLEX     TAKE these medications   alendronate 70 MG tablet Commonly known as:  FOSAMAX Take 70 mg by mouth once a week. Take with a full glass of water on an empty stomach.   amoxicillin-clavulanate 875-125 MG tablet Commonly known as:  AUGMENTIN Take 1 tablet by mouth every 12 (twelve) hours for 4 days.   carvedilol 3.125 MG tablet Commonly known as:  COREG Take 1 tablet (3.125 mg total) by mouth 2 (two) times daily with a meal.   cholecalciferol 1000 units tablet Commonly known as:  VITAMIN D Take 2,000 Units by mouth daily.   fluconazole 200 MG tablet Commonly known as:  DIFLUCAN Take 1 tablet (200 mg total) by mouth daily for 3 days. Start taking on:  December 09, 2018   furosemide 40 MG tablet Commonly known as:  LASIX Take 0.5 tablets (20 mg total) by mouth daily as needed for fluid.   warfarin 4 MG tablet Commonly known as:  COUMADIN Take 2 tablets (8 mg total) by mouth daily at 6 PM. What changed:  how much to take            Durable Medical Equipment  (From admission, onward)         Start     Ordered   12/08/18 1032  For home use only DME Other see comment  Once    Comments:  Transport chair   12/08/18 1031   12/07/18 1630  For home use only DME Bedside commode  Once    Question:  Patient needs  a bedside commode to treat with the following condition  Answer:  Muscular deconditioning   12/07/18 1629   12/07/18 1628  For home use only DME Hospital bed  Once    Question Answer Comment  Patient has (list medical condition): perirectal post surgical complex wound   The above medical condition requires: Patient requires the ability to reposition frequently   Bed type Semi-electric   Hoyer Lift Yes      12/07/18 1629         No Known Allergies Follow-up Information    Evans Lance, MD Follow up.   Specialty:  Cardiology Why:  12/30/2018 @ 10:00AM.  This will be a virtual/video or telephone visit.  Please see discharge instructions to help facilitate this. Contact information: [address] [place] [address] [facility] Follow up.   Specialty:  Home Health Services Why:  HHRN/PT/OT/aide/social work arranged- they will contact you to set up  home visits (contact #- [phone])       [facility] Follow up.   Why:  hospital bed, hoyer lift, BSC, transport chair arranged- to be delivered to the home (contact # [phone])           The results of significant diagnostics from this hospitalization (including imaging, microbiology, ancillary and laboratory) are listed below for reference.    Significant Diagnostic Studies: Dg Abd 1 View  Result Date: 11/30/2018 CLINICAL DATA:  Hypogastric pain and nausea. EXAM: ABDOMEN - 1 VIEW COMPARISON:  None. FINDINGS: Moderate amount of gas in the colon. The pattern does not suggest obstruction. Small bowel pattern is within normal limits. No significant soft tissue calcifications. Phleboliths in the pelvis. Curvature and degenerative change of the spine. 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 Electronically Signed   By: Nelson Chimes M.D.   On: 11/30/2018 19:44    Ct Abdomen Pelvis W Contrast  Result Date: 11/26/2018 CLINICAL DATA:  Anorectal abscess EXAM: CT ABDOMEN AND PELVIS WITH CONTRAST TECHNIQUE: Multidetector CT imaging of the abdomen and pelvis was performed using the standard protocol following bolus administration of intravenous contrast. CONTRAST:  155mL OMNIPAQUE IOHEXOL 300 MG/ML  SOLN COMPARISON:  Virtual colonoscopy dated 05/04/2014 FINDINGS: Motion degraded images. Lower chest: Cardiomegaly. Pacemaker leads, incompletely visualized. Hepatobiliary: Liver is within normal limits. Gallbladder is unremarkable. No intrahepatic or extrahepatic ductal dilatation. Pancreas: Within normal limits. Spleen: Within normal limits. Adrenals/Urinary Tract: Adrenal glands are within normal limits. Moderate right hydronephrosis with UPJ configuration, chronic. Overlying cortical thinning/scarring, chronic. Small left renal cysts, poorly evaluated due to motion degradation. Extrarenal pelvis. Bladder is within normal limits. Stomach/Bowel: Stomach is within normal limits. No evidence of bowel obstruction. Status post partial right hemicolectomy with suture line in the right mid abdomen. Appendix is not discretely visualized. Extensive left colonic diverticulosis, without evidence of diverticulitis. No discrete perianal/perirectal fluid collection/abscess. However, there are multiple foci of gas in the right perineal and right medial gluteal region (series 3/images 85 and 90). In the absence of recent surgical intervention, this is worrisome for perineal infection, Fournier's gangrene not excluded. Vascular/Lymphatic: No evidence of abdominal aortic aneurysm. Atherosclerotic calcifications of the abdominal aorta and branch vessels. No suspicious abdominopelvic lymphadenopathy. Reproductive: Status post hysterectomy. No adnexal masses. Other: No abdominopelvic ascites. Musculoskeletal: Mild degenerative changes of the visualized thoracolumbar spine. IMPRESSION: No discrete  perianal/perirectal fluid collection/abscess. Multiple foci of gas in the right perineal and right medial gluteal region. In the absence of recent surgical intervention, this is worrisome for perineal infection, Fournier's gangrene not excluded. Surgical consultation is advised. These results were called by telephone at the time of interpretation on 11/26/2018 at 10:30 pm to Dr. Isla Pence , who verbally acknowledged these results. Electronically Signed   By: Julian Hy M.D.   On: 11/26/2018 22:33   Dg Chest Port 1 View  Result Date: 11/26/2018 CLINICAL DATA:  Syncopal episode with loss of consciousness while in the waiting room of the surgical center earlier today. Indwelling biventricular pacing defibrillator. EXAM: PORTABLE CHEST 1 VIEW COMPARISON:  10/26/2018 and earlier. FINDINGS: Cardiac silhouette markedly enlarged, unchanged. Prior mitral valve replacement. LEFT subclavian biventricular pacing defibrillator unchanged. RIGHT subclavian dual lead transvenous pacemaker unchanged. Lungs clear. Bronchovascular markings normal, with calcified tracheobronchial cartilages. Pulmonary vascularity normal. No visible pleural effusions. No pneumothorax. Stable BILATERAL lower lobe bronchiectasis. IMPRESSION: Stable marked cardiomegaly. No acute cardiopulmonary disease. Electronically  Signed   By: Evangeline Dakin M.D.   On: 11/26/2018 16:12    Microbiology: No results found for this or any previous visit (from the past 240 hour(s)).   Labs: Basic Metabolic Panel: Recent Labs  Lab 12/02/18 0248 12/04/18 0513 12/07/18 0942  NA 132* 133* 132*  K 5.1 4.1 4.3  CL 103 98 99  CO2 20* 25 23  GLUCOSE 86 102* 96  BUN 10 9 6*  CREATININE 1.03* 0.86 0.80  CALCIUM 8.7* 8.6* 8.8*  MG  --   --  2.0   Liver Function Tests: No results for input(s): AST, ALT, ALKPHOS, BILITOT, PROT, ALBUMIN in the last 168 hours. No results for input(s): LIPASE, AMYLASE in the last 168 hours. No results for input(s):  AMMONIA in the last 168 hours. CBC: Recent Labs  Lab 12/02/18 0248 12/03/18 0343 12/04/18 0513  WBC 10.5 8.1 6.3  HGB 12.9 11.3* 11.2*  HCT 39.6 35.9* 36.1  MCV 88.8 88.0 90.7  PLT 292 270 278   Cardiac Enzymes: No results for input(s): CKTOTAL, CKMB, CKMBINDEX, TROPONINI in the last 168 hours. BNP: BNP (last 3 results) Recent Labs    10/26/18 1855  BNP 1,092.7*    ProBNP (last 3 results) No results for input(s): PROBNP in the last 8760 hours.  CBG: Recent Labs  Lab 12/07/18 2036 12/07/18 2346 12/08/18 0442 12/08/18 0807 12/08/18 1157  GLUCAP 106* 119* 103* 90 126*       Signed:  Domenic Polite MD.  [facility] 12/08/2018, 12:20 PM

## 2018-12-08 NOTE — TOC Transition Note (Signed)
Transition of Care [facility]) - CM/SW Discharge Note Marvetta Gibbons RN, BSN Transitions of Care Unit 4E- RN Case Manager [phone]  Patient Details  Name: Francy Mcilvaine MRN: [id number] Date of Birth: 12-12-1945  Transition of Care [facility]) CM/SW Contact:  Dawayne Patricia, RN Phone Number:  [phone] 12/08/2018, 1:57 PM   Clinical Narrative:    Pt stable for transition home today per MD, orders have been placed for DME and HH. CM spoke with Leroy Sea at [facility] for DME needs with need for delivery today-(planned discharge)- also spoke with Butch Penny at [facility] for start of care - HHRN/PT/OT/aide/SW- referral has been accepted and they will f/u for [facility] services. Call made to [name]- spouse to let him know of discharge today and that [facility] would be calling to set up delivery time for today of DME he is to call when he knows timeframe- also discussed PC needs for home- he states he does not have preference and is fine with "whoever" CM will made call to [facility]) for PC needs- spoke with Ivin Booty for [facility] referral and she will f/u with PCP for community PC f/u.  1400- call received from spouse and DME delivery time is set for 4-8pm today will arrange PT[facility] transport once DME delivery confirmed to the home. Spouse to call unit once [facility] is at the home with equipment. CM will leave PTAR paperwork in shadow chart for staff and # to call for transport.    Final next level of care: [place] Barriers to Discharge: No Barriers Identified   Patient Goals and CMS Choice Patient states their goals for this hospitalization and ongoing recovery are:: To return home-per pt's spouse CMS Medicare.gov Compare Post Acute Care list provided to:: Patient Represenative (must comment)(spouse via phone) Choice offered to / list presented to : Spouse  Discharge Placement   Home with [facility] services                     Discharge Plan and Services In-house Referral: Clinical Social  Work Discharge Planning Services: CM Consult Post Acute Care Choice: Durable Medical Equipment, Home Health          DME Arranged: Hospital bed, 3-N-1, Other see comment DME Agency: [facility] HH Arranged: RN, PT, OT, Nurse's Aide, Social Work, Refused SNF [place] Agency: [place] ([facility])   Social Determinants of Health (SDOH) Interventions     Readmission Risk Interventions Readmission Risk Prevention Plan 12/05/2018 12/05/2018  Transportation Screening - Complete  PCP or Specialist Appt within 3-5 Days - Complete  HRI or Home Care Consult Complete (No Data)  Social Work Consult for [place] Planning/Counseling Complete -  [place] - Not Applicable  Medication Review Press photographer) - Complete  Some recent data might be hidden

## 2018-12-14 ENCOUNTER — Other Ambulatory Visit: Payer: Self-pay

## 2018-12-14 ENCOUNTER — Telehealth: Payer: Self-pay

## 2018-12-14 ENCOUNTER — Other Ambulatory Visit: Payer: Medicare HMO | Admitting: Adult Health Nurse Practitioner

## 2018-12-14 DIAGNOSIS — Z515 Encounter for palliative care: Secondary | ICD-10-CM

## 2018-12-14 NOTE — Telephone Encounter (Signed)
Patient's husband, [name],  contacted for Palliative Care visit.  Due to the current COVID-19 infection/crises, the patient and family prefer, and have given their verbal consent for, a provider visit via telemedicine. HIPPA policies of confidentially were discussed and patient/family expressed understanding.  Visit scheduled for 12/14/2018 @ 3pm

## 2018-12-14 NOTE — Progress Notes (Signed)
[facility] Consult Note Telephone: [phone]  Fax: [phone]  PATIENT NAME: Angela [name] DOB: Nov 25, 1945 MRN: [id number]  PRIMARY CARE PROVIDER:   Lorene Dy, MD  REFERRING PROVIDER:  Lorene Dy, [address], [place], [place] [address]  RESPONSIBLE PARTY:   Latise Dilley, husband [phone]  Due to the COVID-19 crisis, this visit was done via telemedicine and it was initiated and consent by this patient and or family. Video-audio (telehealth) contact was unable to be done due to technical barriers from the patient's side.    RECOMMENDATIONS and PLAN:  1. Dementia. FAST 6a.  Husband states that prior to her hospitalization earlier this month that she was able to walk with a cane.  Now she is too weak to walk.  She is mostly in bed and has been able to stand and pivot to get into a chair.  She sat up for 15 minutes yesterday and about an hour and a half today.  She currently has a foley catheter and husband states that prior to hospitalization she was able to go to bathroom on her own.  Does state that she is able to tell him when she has to have a BM.  She is able to feed herself and husband states that she has been eating well since being at home.   2.  Perirectal abscess.  Patient was hospitalized 11/26/2018 to 12/08/2018 for infected perirectal abscess.  It required surgical debridement on 11/28/2018.  Husband states that it is a good size wound and home health RN is supposed to come and help with dressing changes.  States that RN was out Wednesday last week.  Was supposed to come back on Friday and did not show up.  States that he has been taking care of the dressing changes.  Does state that his wife is able to reposition herself in bed to relieve pressure on the wound.  Does state she has pain associated with wound.  He notices she hurts periodically through the day and some nights does not sleep well because of the pain.  Was  discharged from the hospital without anything for pain.  Husband has been giving Tylenol with little relief.  Recommend Tramadol to help with the pain, especially with dressing changes and at night.  Staff from PCP office may need to get with home health agency to see if hospital recommendations for RN/PT/OT are being followed.  Also recommend a gel cushion for chair and possibly gel cover for mattress to help relieve pressure on wound during healing.  3.  Constipation.  Husband states that she has not had a BM in last 3 days. Have recommended some OTC remedies such as colace and miralax.  Have educated husband that may have to work with Miralax to a frequency that is effective for patient to avoid causing diarrhea.    4.  Advanced care planning.  Hospital palliative notes indicate an extensive conversation with husband and family about patient's poor prognosis with advanced dementia.  Family's focus was getting patient home and thinking that she would eat better when she got home, which her appetite has improved since being home.  He states that he has already had that discussion and did not want to make any changes at this time.  Have tried calling PCP's office to discuss pain management and gel cushions for chair and bed.  Was transferred to nurse but did not get to talk with anyone and no option for  message to be left.  Will forward note with recommendations.  Have scheduled telephonic appointment in 2 weeks on 12/28/2018 at 3pm.  I spent 45 minutes providing this consultation,  from 3:00 to 3:45. More than 50% of the time in this consultation was spent coordinating communication.   HISTORY OF PRESENT ILLNESS:  Angela [name] is a 73 y.o. year old female with multiple medical problems including dementia, afib on coumadin, cardiomyopathy with EF 15-20%, CHF, arthritis. Palliative Care was asked to help address goals of care.   Patient does have ICD in place and while in hospital it fired off 5 times due to  patient going into V fib.  CODE STATUS: Full code  PPS: 30% HOSPICE ELIGIBILITY/DIAGNOSIS: TBD  PAST MEDICAL HISTORY:  Past Medical History:  Diagnosis Date  . Abnormality of gait 08/23/2014  . Arthritis   . Atrial fibrillation ([facility])    A.  Chronic Coumadin  . Cardiac arrest - ventricular fibrillation    A.  12/2007;  B. 01/2008 St. Jude Promote Bi-V ICD placed  . CVA (cerebral vascular accident) ([facility])   . Diverticular disease   . DVT (deep venous thrombosis) ([facility])   . 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 History of colon cancer    A.  1999 - T3, N1  chemotherapy  . Internal hemorrhoids   . Memory deficit 08/23/2014  . Nonischemic cardiomyopathy ([facility])    a.  11/2001 - Cath - NL Cors; b. ECHO 09/22/11: EF 20%, MVR normal, moderate LAE, mild RAE c. ECHO (08/2012): ED 20%, diff HK, LA mod dilated, mild/mod TR, RV mild/mod decreased sys fx  . Rectal bleeding 04/28/2017  . Rheumatic heart disease    A. 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 Sick sinus syndrome ([facility])    A.  s/p pacer in 1995.  B.    . Systolic CHF, chronic ([facility])    A.  01/2008 Echo - EF 10-20%    SOCIAL HX:  Social History   Tobacco Use  . Smoking status: Never Smoker  . Smokeless tobacco: Never Used  Substance Use Topics  . Alcohol use: No    ALLERGIES: No Known Allergies   PERTINENT MEDICATIONS:  Outpatient Encounter Medications as of 12/14/2018  Medication Sig  . alendronate (FOSAMAX) 70 MG tablet Take 70 mg by mouth once a week. Take with a full glass of water on an empty stomach.  [name] amiodarone (PACERONE) 200 MG tablet Take 2 tablets (400 mg total) by mouth daily.  . carvedilol (COREG) 3.125 MG tablet Take 1 tablet (3.125 mg total) by mouth 2 (two) times daily with a meal.  . cholecalciferol (VITAMIN D) 1000 UNITS tablet Take 2,000 Units by mouth daily.  . furosemide (LASIX) 40 MG tablet Take 0.5 tablets (20 mg total) by mouth daily as needed for  fluid.  [name] oxyCODONE (OXY IR/ROXICODONE) 5 MG immediate release tablet Take 1-2 tablets (5-10 mg total) by mouth every 6 (six) hours as needed for moderate pain.  [name] warfarin (COUMADIN) 4 MG tablet Take 2 tablets (8 mg total) by mouth daily at 6 PM. (Patient taking differently: Take 6 mg by mouth daily at 6 PM. )   No facility-administered encounter medications on file as of 12/14/2018.       Amy Jenetta Downer, NP

## 2018-12-28 ENCOUNTER — Other Ambulatory Visit: Payer: Medicare HMO | Admitting: Adult Health Nurse Practitioner

## 2018-12-28 ENCOUNTER — Other Ambulatory Visit: Payer: Self-pay

## 2018-12-28 DIAGNOSIS — Z515 Encounter for palliative care: Secondary | ICD-10-CM

## 2018-12-28 NOTE — Progress Notes (Signed)
[facility] Consult Note Telephone: [phone]  Fax: [phone]  PATIENT NAME: Angela [name] DOB: [birth year] MRN: [id number]  PRIMARY CARE PROVIDER:   Lorene Dy, MD  REFERRING PROVIDER:  Lorene [name], [address], [place], [place] [address]  RESPONSIBLE PARTY:   Angela [name], husband [phone]  Due to the COVID-19 crisis, this visit was done via telemedicine and it was initiated and consent by this patient and or family. Video-audio (telehealth) contact was unable to be done due to technical barriers from the patient's side.     RECOMMENDATIONS and PLAN:  1.  Dementia.  FAST 6a. Husband states that patient is getting stronger with HH PT/OT.  States that she is getting out of bed and using a walker to go to the bathroom. She still needs assistance but will try to get up by herself. States that she feeds herself and she has been eating and drinking well.  Continue strengthening with HH PT/OT  2.  Perirectal abscess.  Husband states that this is improving and she no longer complains of any pain with it.  Continue current wound protocol through [facility].  3.  Constipation.  Husband states that she was having some more constipation but has started giving her a stool softener with good results.  He could not remember the name of the stool softener.  4.  Advanced care planning.  Patient remains a full code.  Patient overall seems to be improving by what the husband has reported.  She is eating and drinking well.  Has increased strength and mobility.  Will follow up in one month  I spent 30 minutes providing this consultation,  from 3:00 to 3:30. More than 50% of the time in this consultation was spent coordinating communication.   HISTORY OF PRESENT ILLNESS:  Angela [name] is a 73 y.o. year old female with multiple medical problems including dementia, afib on coumadin, cardiomyopathy with EF 15-20%, CHF, arthritis. Patient has ICD.  Palliative Care was asked to help address goals of care.   CODE STATUS: Full Code  PPS: 40% HOSPICE ELIGIBILITY/DIAGNOSIS: TBD  PAST MEDICAL HISTORY:  Past Medical History:  Diagnosis Date  . Abnormality of gait 08/23/2014  . Arthritis   . Atrial fibrillation ([facility])    A.  Chronic Coumadin  . Cardiac arrest - ventricular fibrillation    A.  12/2007;  B. 01/2008 St. Jude Promote Bi-V ICD placed  . CVA (cerebral vascular accident) ([facility])   . Diverticular disease   . DVT (deep venous thrombosis) ([facility])   . Embolus and thrombosis of iliac artery ([facility])    A.  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 History of colon cancer    A.  1999 - T3, N1  chemotherapy  . Internal hemorrhoids   . Memory deficit 08/23/2014  . Nonischemic cardiomyopathy ([facility])    a.  11/2001 - Cath - NL Cors; b. ECHO 09/22/11: EF 20%, MVR normal, moderate LAE, mild RAE c. ECHO (08/2012): ED 20%, diff HK, LA mod dilated, mild/mod TR, RV mild/mod decreased sys fx  . Rectal bleeding 04/28/2017  . Rheumatic heart disease    A. 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 Sick sinus syndrome ([facility])    A.  s/p pacer in 1995.  B.    . Systolic CHF, chronic ([facility])    A.  01/2008 Echo - EF 10-20%    SOCIAL HX:  Social History   Tobacco Use  . Smoking  status: Never Smoker  . Smokeless tobacco: Never Used  Substance Use Topics  . Alcohol use: No    ALLERGIES: No Known Allergies   PERTINENT MEDICATIONS:  Outpatient Encounter Medications as of 12/28/2018  Medication Sig  . alendronate (FOSAMAX) 70 MG tablet Take 70 mg by mouth once a week. Take with a full glass of water on an empty stomach.  [name] amiodarone (PACERONE) 200 MG tablet Take 2 tablets (400 mg total) by mouth daily.  . carvedilol (COREG) 3.125 MG tablet Take 1 tablet (3.125 mg total) by mouth 2 (two) times daily with a meal.  . cholecalciferol (VITAMIN D) 1000 UNITS tablet Take 2,000 Units by mouth daily.  . furosemide (LASIX) 40 MG tablet Take 0.5 tablets (20 mg total)  by mouth daily as needed for fluid.  [name] oxyCODONE (OXY IR/ROXICODONE) 5 MG immediate release tablet Take 1-2 tablets (5-10 mg total) by mouth every 6 (six) hours as needed for moderate pain.  [name] warfarin (COUMADIN) 4 MG tablet Take 2 tablets (8 mg total) by mouth daily at 6 PM. (Patient taking differently: Take 6 mg by mouth daily at 6 PM. )   No facility-administered encounter medications on file as of 12/28/2018.        Amy Jenetta Downer, NP

## 2018-12-30 ENCOUNTER — Telehealth: Payer: Medicare HMO | Admitting: Internal Medicine

## 2018-12-30 ENCOUNTER — Telehealth: Payer: Self-pay | Admitting: Internal Medicine

## 2018-12-30 NOTE — Telephone Encounter (Signed)
New Message:    Please call BK from Dr Latrelle Dodrill Office. She needs to talk to you about pt's low blood pressure.'

## 2018-12-31 NOTE — Telephone Encounter (Signed)
Returned call to Dr. Mancel Bale nurse B[facility].  Per review of Pt medications, Pt is on amiodarone 400 mg daiily per discharging physician 12/14/2018.  It appears Pt may still be on amiodarone 400  Mg daily.  Request was if Dr. Mancel Bale can manage Pt medication?  Or does Dr. Lovena Le want to do it.  Advised if Dr. Mancel Bale felt comfortable managing medications (has seen Pt and is palliative care physician) that would be ok with Dr. Lovena Le.  Advised if he needed any assistance or wanted to ask any questions Dr. Lovena Le would be available to assist.  Dr. Mancel Bale office unsure of Pt current amiodarone intake, will confirm and manage.

## 2019-01-05 NOTE — Telephone Encounter (Signed)
New message    Pt rs appt from 05.26.20 to 05.14.20 with Dr. Lovena Le. Pt has a landline, will do a telephone call.      Virtual Visit Pre-Appointment Phone Call  "(Name), I am calling you today to discuss your upcoming appointment. We are currently trying to limit exposure to the virus that causes COVID-19 by seeing patients at home rather than in the office."  1. "What is the BEST phone number to call the day of the visit?" - include this in appointment notes  2. Do you have or have access to (through a family member/friend) a smartphone with video capability that we can use for your visit?" a. If yes - list this number in appt notes as cell (if different from BEST phone #) and list the appointment type as a VIDEO visit in appointment notes b. If no - list the appointment type as a PHONE visit in appointment notes  Confirm consent - "In the setting of the current Covid19 crisis, you are scheduled for a (phone or video) visit with your provider on (date) at (time).  Just as we do with many in-office visits, in order for you to participate in this visit, we must obtain consent.  If you'd like, I can send this to your mychart (if signed up) or email for you to review.  Otherwise, I can obtain your verbal consent now.  All virtual visits are billed to your insurance company just like a normal visit would be.  By agreeing to a virtual visit, we'd like you to understand that the technology does not allow for your provider to perform an examination, and thus may limit your provider's ability to fully assess your condition. If your provider identifies any concerns that need to be evaluated in person, we will make arrangements to do so.  Finally, though the technology is pretty good, we cannot assure that it will always work on either your or our end, and in the setting of a video visit, we may have to convert it to a phone-only visit.  In either situation, we cannot ensure that we have a secure  connection.  Are you willing to proceed?" STAFF: Did the patient verbally acknowledge consent to telehealth visit? Document YES/NO here: YES  3. Advise patient to be prepared - "Two hours prior to your appointment, go ahead and check your blood pressure, pulse, oxygen saturation, and your weight (if you have the equipment to check those) and write them all down. When your visit starts, your provider will ask you for this information. If you have an Apple Watch or Kardia device, please plan to have heart rate information ready on the day of your appointment. Please have a pen and paper handy nearby the day of the visit as well."  4. Give patient instructions for MyChart download to smartphone OR Doximity/Doxy.me as below if video visit (depending on what platform provider is using)  5. Inform patient they will receive a phone call 15 minutes prior to their appointment time (may be from unknown caller ID) so they should be prepared to answer    TELEPHONE CALL NOTE  Tawni Melkonian has been deemed a candidate for a follow-up tele-health visit to limit community exposure during the Covid-19 pandemic. I spoke with the patient via phone to ensure availability of phone/video source, confirm preferred email & phone number, and discuss instructions and expectations.  I reminded Angela [name] to be prepared with any vital sign and/or heart rhythm information that could  potentially be obtained via home monitoring, at the time of her visit. I reminded Yashica Sterbenz to expect a phone call prior to her visit.  [place] Harriette Ohara 01/05/2019 10:32 AM   INSTRUCTIONS FOR DOWNLOADING THE MYCHART APP TO SMARTPHONE  - The patient must first make sure to have activated MyChart and know their login information - If Apple, go to [employer] and type in MyChart in the search bar and download the app. If Android, ask patient to go to [employer] and type in Coin in the search bar and download the app. The app is free  but as with any other app downloads, their phone may require them to verify saved payment information or Apple/Android password.  - The patient will need to then log into the app with their MyChart username and password, and select [facility] as their healthcare provider to link the account. When it is time for your visit, go to the MyChart app, find appointments, and click Begin Video Visit. Be sure to Select Allow for your device to access the Microphone and Camera for your visit. You will then be connected, and your provider will be with you shortly.  **If they have any issues connecting, or need assistance please contact MyChart service desk [phone])**  **If using a computer, in order to ensure the best quality for their visit they will need to use either of the following Internet Browsers: [employer], or Google Chrome**  IF USING DOXIMITY or DOXY.ME - The patient will receive a link just prior to their visit by text.     FULL LENGTH CONSENT FOR TELE-HEALTH VISIT   I hereby voluntarily request, consent and authorize [facility] and its employed or contracted physicians, physician assistants, nurse practitioners or other licensed health care professionals (the Practitioner), to provide me with telemedicine health care services (the Services") as deemed necessary by the treating Practitioner. I acknowledge and consent to receive the Services by the Practitioner via telemedicine. I understand that the telemedicine visit will involve communicating with the Practitioner through live audiovisual communication technology and the disclosure of certain medical information by electronic transmission. I acknowledge that I have been given the opportunity to request an in-person assessment or other available alternative prior to the telemedicine visit and am voluntarily participating in the telemedicine visit.  I understand that I have the right to withhold or withdraw my consent  to the use of telemedicine in the course of my care at any time, without affecting my right to future care or treatment, and that the Practitioner or I may terminate the telemedicine visit at any time. I understand that I have the right to inspect all information obtained and/or recorded in the course of the telemedicine visit and may receive copies of available information for a reasonable fee.  I understand that some of the potential risks of receiving the Services via telemedicine include:   Delay or interruption in medical evaluation due to technological equipment failure or disruption;  Information transmitted may not be sufficient (e.g. poor resolution of images) to allow for appropriate medical decision making by the Practitioner; and/or   In rare instances, security protocols could fail, causing a breach of personal health information.  Furthermore, I acknowledge that it is my responsibility to provide information about my medical history, conditions and care that is complete and accurate to the best of my ability. I acknowledge that Practitioner's advice, recommendations, and/or decision may be based on factors not within their control,  such as incomplete or inaccurate data provided by me or distortions of diagnostic images or specimens that may result from electronic transmissions. I understand that the practice of medicine is not an exact science and that Practitioner makes no warranties or guarantees regarding treatment outcomes. I acknowledge that I will receive a copy of this consent concurrently upon execution via email to the email address I last provided but may also request a printed copy by calling the office of [facility].    I understand that my insurance will be billed for this visit.   I have read or had this consent read to me.  I understand the contents of this consent, which adequately explains the benefits and risks of the Services being provided via telemedicine.   I  have been provided ample opportunity to ask questions regarding this consent and the Services and have had my questions answered to my satisfaction.  I give my informed consent for the services to be provided through the use of telemedicine in my medical care  By participating in this telemedicine visit I agree to the above.

## 2019-01-06 ENCOUNTER — Telehealth: Payer: Self-pay | Admitting: Internal Medicine

## 2019-01-06 NOTE — Telephone Encounter (Signed)
Pt has a pilonydal cyst.  When the pt was in the hospital, she was written home health orders for PT and OT.  Home Health PT feels that the Pt needs more treatment, but has run out of scheduled visits.  Her PCP Dr Mancel Bale will not sign orders to extend the PT and OT, simply because the Pt needs to come in to see Dr. Mancel Bale.  The Home Health PT was hoping Dr. Lovena Le would sign the orders so that the Pt can continue in home PT and OT. PT and OT both need 3 more visits.  Number given is the PT's personal cell phone, and a detailed message can be left on the phone

## 2019-01-06 NOTE — Telephone Encounter (Signed)
Returned call to Pt.  Advised PCP had seen Pt on 12/28/2018 and it is PCP responsibility to sign for PT orders.  PT understands.    No further action needed.

## 2019-01-07 ENCOUNTER — Telehealth (INDEPENDENT_AMBULATORY_CARE_PROVIDER_SITE_OTHER): Payer: Medicare HMO | Admitting: Internal Medicine

## 2019-01-07 ENCOUNTER — Other Ambulatory Visit: Payer: Self-pay

## 2019-01-07 DIAGNOSIS — Z952 Presence of prosthetic heart valve: Secondary | ICD-10-CM | POA: Diagnosis not present

## 2019-01-07 DIAGNOSIS — I5022 Chronic systolic (congestive) heart failure: Secondary | ICD-10-CM

## 2019-01-07 DIAGNOSIS — I4821 Permanent atrial fibrillation: Secondary | ICD-10-CM

## 2019-01-07 DIAGNOSIS — I472 Ventricular tachycardia, unspecified: Secondary | ICD-10-CM

## 2019-01-07 NOTE — Progress Notes (Signed)
Electrophysiology TeleHealth Note   Due to national recommendations of social distancing due to COVID 19, an audio/video telehealth visit is felt to be most appropriate for this patient at this time.  See MyChart message from today for the patient's consent to telehealth for [facility].   Date:  01/07/2019   ID:  Angela [name], DOB 1945/10/26, MRN [id number]  Location: patient's home  Provider location: [address], [place]  Evaluation Performed: Follow-up visit  PCP:  Angela Dy, MD  Cardiologist:  Angela Peru, MD  Electrophysiologist:  Dr Lovena Le  Chief Complaint:  "I am doing better."  History of Present Illness:    Angela [name] is a 73 y.o. female who presents via audio/video conferencing for a telehealth visit today. She is a pleasant 73 yo woman with chronic systolic heart failure, VF, CHB, s/p Biv ICD. 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 She has had a long convalescence but appears to be getting better. Since last being seen in our clinic, the patient reports doing very well.  Today, she denies symptoms of palpitations, chest pain, shortness of breath,  lower extremity edema, dizziness, presyncope, or syncope.  The patient is otherwise without complaint today.  The patient denies symptoms of fevers, chills, cough, or new SOB worrisome for COVID 19.  Past Medical History:  Diagnosis Date  . Abnormality of gait 08/23/2014  . Arthritis   . Atrial fibrillation ([place])    A.  Chronic Coumadin  . Cardiac arrest - ventricular fibrillation    A.  12/2007;  B. 01/2008 St. Jude Promote Bi-V ICD placed  . CVA (cerebral vascular accident) ([place])   . Diverticular disease   . DVT (deep venous thrombosis) ([place])   . Embolus and thrombosis of iliac artery ([place])    A.  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 History of colon cancer    A.  1999 - T3, N1  chemotherapy  . Internal hemorrhoids   . Memory deficit 08/23/2014  .  Nonischemic cardiomyopathy ([place])    a.  11/2001 - Cath - NL Cors; b. ECHO 09/22/11: EF 20%, MVR normal, moderate LAE, mild RAE c. ECHO (08/2012): ED 20%, diff HK, LA mod dilated, mild/mod TR, RV mild/mod decreased sys fx  . Rectal bleeding 04/28/2017  . Rheumatic heart disease    A. 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 Sick sinus syndrome ([place])    A.  s/p pacer in 1995.  B.    . Systolic CHF, chronic ([place])    A.  01/2008 Echo - EF 10-20%    Past Surgical History:  Procedure Laterality Date  . ABDOMINAL HYSTERECTOMY    . AV NODE ABLATION N/A 09/23/2011   Procedure: AV NODE ABLATION;  Surgeon: Evans Lance, MD;  Location: [facility] CATH LAB;  Service: Cardiovascular;  Laterality: N/A;  . BIV ICD GENERTAOR CHANGE OUT N/A 09/05/2014   Procedure: BIV ICD GENERTAOR CHANGE OUT;  Surgeon: Evans Lance, MD;  Location: [facility] CATH LAB;  Service: Cardiovascular;  Laterality: N/A;  . Biventricular AICD    . COLON SURGERY  1999  . History of echocardiogram  2003, 2007, 2009  . INCISION AND DRAINAGE PERIRECTAL ABSCESS Right 11/28/2018   Procedure: IRRIGATION AND DEBRIDEMENT PERIRECTAL ABSCESS;  Surgeon: Greer Pickerel, MD;  Location: [place];  Service: General;  Laterality: Right;  . Mitral Valve Replacement, Bjork-Shiley valve  1983    Current Outpatient Medications  Medication Sig Dispense  Refill  . alendronate (FOSAMAX) 70 MG tablet Take 70 mg by mouth once a week. Take with a full glass of water on an empty stomach.    [name] amiodarone (PACERONE) 200 MG tablet Take 2 tablets (400 mg total) by mouth daily. 14 tablet 0  . carvedilol (COREG) 3.125 MG tablet Take 1 tablet (3.125 mg total) by mouth 2 (two) times daily with a meal. 60 tablet 12  . cholecalciferol (VITAMIN D) 1000 UNITS tablet Take 2,000 Units by mouth daily.    . furosemide (LASIX) 40 MG tablet Take 0.5 tablets (20 mg total) by mouth daily as needed for fluid.    [name] oxyCODONE (OXY IR/ROXICODONE) 5 MG immediate release tablet Take 1-2 tablets (5-10 mg total) by  mouth every 6 (six) hours as needed for moderate pain. 30 tablet 0  . warfarin (COUMADIN) 4 MG tablet Take 2 tablets (8 mg total) by mouth daily at 6 PM. (Patient taking differently: Take 6 mg by mouth daily at 6 PM. ) 28 tablet 0   No current facility-administered medications for this visit.     Allergies:   Patient has no known allergies.   Social History:  The patient  reports that she has never smoked. She has never used smokeless tobacco. She reports that she does not drink alcohol or use drugs.   Family History:  The patient's  family history includes Coronary artery disease in her mother and sister; Diabetes (age of onset: 58) in her mother; Lung cancer (age of onset: 82) in her father.   ROS:  Please see the history of present illness.   All other systems are personally reviewed and negative.    Exam:    Vital Signs:  There were no vitals taken for this visit.  Well appearing, alert and conversant, regular work of breathing,  good skin color Eyes- anicteric, neuro- grossly intact, skin- no apparent rash or lesions or cyanosis, mouth- oral mucosa is pink   Labs/Other Tests and Data Reviewed:    Recent Labs: 10/26/2018: B Natriuretic Peptide 1,092.7 11/26/2018: ALT 44 12/04/2018: Hemoglobin 11.2; Platelets 278 12/07/2018: BUN 6; Creatinine, Ser 0.80; Magnesium 2.0; Potassium 4.3; Sodium 132   Wt Readings from Last 3 Encounters:  12/08/18 131 lb 13.4 oz (59.8 kg)  11/05/18 143 lb 3.2 oz (65 kg)  10/28/18 143 lb 8 oz (65.1 kg)     Other studies personally reviewed:  Last device remote is reviewed from [facility] PDF dated 11/10/18 which reveals normal device function, no arrhythmias except for persistent atrial fib   ASSESSMENT & PLAN:    1.  VT/VF - she is stable and has had no recurrent ICD therapies. She has stopped her amiodarone. 2. Chronic systolic heart failure - her BP has been low and she has stopped her coreg. 3. Gangrene - she is s/p surgical debridement and appears  to be healing nicely. 4. Mechanical mitral valve - she will continue systemic anti-coagulation 5. COVID 19 screen The patient denies symptoms of COVID 19 at this time.  The importance of social distancing was discussed today.  Follow-up:  3 months with me Next remote: 6/20  Current medicines are reviewed at length with the patient today.   The patient does not have concerns regarding her medicines.  The following changes were made today:  none  Labs/ tests ordered today include: none No orders of the defined types were placed in this encounter.    Patient Risk:  after full review of this patients clinical  status, I feel that they are at moderate risk at this time.  Today, I have spent 15 minutes with the patient with telehealth technology discussing all of the above.    Signed, Angela Peru, MD  01/07/2019 1:46 PM     [facility] [address] [place] [address] [phone] (office) [phone] (fax)

## 2019-01-14 ENCOUNTER — Other Ambulatory Visit: Payer: Self-pay

## 2019-01-14 ENCOUNTER — Encounter (HOSPITAL_COMMUNITY): Payer: Self-pay

## 2019-01-14 ENCOUNTER — Emergency Department (HOSPITAL_COMMUNITY): Payer: Medicare HMO

## 2019-01-14 ENCOUNTER — Inpatient Hospital Stay (HOSPITAL_COMMUNITY)
Admission: EM | Admit: 2019-01-14 | Discharge: 2019-01-19 | DRG: 871 | Disposition: A | Discharge status: Home Health | Payer: Medicare HMO | Attending: Family Medicine | Admitting: Family Medicine

## 2019-01-14 DIAGNOSIS — G9341 Metabolic encephalopathy: Secondary | ICD-10-CM

## 2019-01-14 DIAGNOSIS — I4821 Permanent atrial fibrillation: Secondary | ICD-10-CM | POA: Diagnosis present

## 2019-01-14 DIAGNOSIS — F0390 Unspecified dementia without behavioral disturbance: Secondary | ICD-10-CM | POA: Diagnosis not present

## 2019-01-14 DIAGNOSIS — I5022 Chronic systolic (congestive) heart failure: Secondary | ICD-10-CM | POA: Diagnosis present

## 2019-01-14 DIAGNOSIS — I4901 Ventricular fibrillation: Secondary | ICD-10-CM | POA: Diagnosis present

## 2019-01-14 DIAGNOSIS — Z95810 Presence of automatic (implantable) cardiac defibrillator: Secondary | ICD-10-CM | POA: Diagnosis not present

## 2019-01-14 DIAGNOSIS — I472 Ventricular tachycardia: Secondary | ICD-10-CM | POA: Diagnosis present

## 2019-01-14 DIAGNOSIS — Z85038 Personal history of other malignant neoplasm of large intestine: Secondary | ICD-10-CM

## 2019-01-14 DIAGNOSIS — I495 Sick sinus syndrome: Secondary | ICD-10-CM | POA: Diagnosis present

## 2019-01-14 DIAGNOSIS — I442 Atrioventricular block, complete: Secondary | ICD-10-CM | POA: Diagnosis present

## 2019-01-14 DIAGNOSIS — I110 Hypertensive heart disease with heart failure: Secondary | ICD-10-CM | POA: Diagnosis present

## 2019-01-14 DIAGNOSIS — Z801 Family history of malignant neoplasm of trachea, bronchus and lung: Secondary | ICD-10-CM

## 2019-01-14 DIAGNOSIS — Z952 Presence of prosthetic heart valve: Secondary | ICD-10-CM | POA: Diagnosis not present

## 2019-01-14 DIAGNOSIS — Z8673 Personal history of transient ischemic attack (TIA), and cerebral infarction without residual deficits: Secondary | ICD-10-CM

## 2019-01-14 DIAGNOSIS — Z833 Family history of diabetes mellitus: Secondary | ICD-10-CM

## 2019-01-14 DIAGNOSIS — Z7983 Long term (current) use of bisphosphonates: Secondary | ICD-10-CM

## 2019-01-14 DIAGNOSIS — I447 Left bundle-branch block, unspecified: Secondary | ICD-10-CM | POA: Diagnosis present

## 2019-01-14 DIAGNOSIS — K6289 Other specified diseases of anus and rectum: Secondary | ICD-10-CM | POA: Diagnosis not present

## 2019-01-14 DIAGNOSIS — R6520 Severe sepsis without septic shock: Secondary | ICD-10-CM | POA: Diagnosis present

## 2019-01-14 DIAGNOSIS — N7682 Fournier disease of vagina and vulva: Secondary | ICD-10-CM | POA: Diagnosis present

## 2019-01-14 DIAGNOSIS — Z6822 Body mass index (BMI) 22.0-22.9, adult: Secondary | ICD-10-CM | POA: Diagnosis not present

## 2019-01-14 DIAGNOSIS — I4891 Unspecified atrial fibrillation: Secondary | ICD-10-CM | POA: Diagnosis present

## 2019-01-14 DIAGNOSIS — Z86718 Personal history of other venous thrombosis and embolism: Secondary | ICD-10-CM | POA: Diagnosis not present

## 2019-01-14 DIAGNOSIS — N7689 Other specified inflammation of vagina and vulva: Secondary | ICD-10-CM | POA: Diagnosis present

## 2019-01-14 DIAGNOSIS — G9340 Encephalopathy, unspecified: Secondary | ICD-10-CM

## 2019-01-14 DIAGNOSIS — R54 Age-related physical debility: Secondary | ICD-10-CM | POA: Diagnosis present

## 2019-01-14 DIAGNOSIS — I252 Old myocardial infarction: Secondary | ICD-10-CM | POA: Diagnosis not present

## 2019-01-14 DIAGNOSIS — Z20828 Contact with and (suspected) exposure to other viral communicable diseases: Secondary | ICD-10-CM | POA: Diagnosis present

## 2019-01-14 DIAGNOSIS — I428 Other cardiomyopathies: Secondary | ICD-10-CM | POA: Diagnosis present

## 2019-01-14 DIAGNOSIS — E440 Moderate protein-calorie malnutrition: Secondary | ICD-10-CM | POA: Diagnosis present

## 2019-01-14 DIAGNOSIS — N390 Urinary tract infection, site not specified: Secondary | ICD-10-CM | POA: Diagnosis present

## 2019-01-14 DIAGNOSIS — I4729 Other ventricular tachycardia: Secondary | ICD-10-CM

## 2019-01-14 DIAGNOSIS — Z7189 Other specified counseling: Secondary | ICD-10-CM | POA: Diagnosis not present

## 2019-01-14 DIAGNOSIS — A4151 Sepsis due to Escherichia coli [E. coli]: Secondary | ICD-10-CM | POA: Diagnosis present

## 2019-01-14 DIAGNOSIS — Z8249 Family history of ischemic heart disease and other diseases of the circulatory system: Secondary | ICD-10-CM

## 2019-01-14 DIAGNOSIS — Z7901 Long term (current) use of anticoagulants: Secondary | ICD-10-CM

## 2019-01-14 DIAGNOSIS — I5042 Chronic combined systolic (congestive) and diastolic (congestive) heart failure: Secondary | ICD-10-CM | POA: Diagnosis present

## 2019-01-14 DIAGNOSIS — Z515 Encounter for palliative care: Secondary | ICD-10-CM | POA: Diagnosis not present

## 2019-01-14 LAB — COMPREHENSIVE METABOLIC PANEL
ALT: 15 U/L (ref 0–44)
AST: 30 U/L (ref 15–41)
Albumin: 2.9 g/dL — ABNORMAL LOW (ref 3.5–5.0)
Alkaline Phosphatase: 53 U/L (ref 38–126)
Anion gap: 10 (ref 5–15)
BUN: 16 mg/dL (ref 8–23)
CO2: 22 mmol/L (ref 22–32)
Calcium: 8.7 mg/dL — ABNORMAL LOW (ref 8.9–10.3)
Chloride: 106 mmol/L (ref 98–111)
Creatinine, Ser: 0.85 mg/dL (ref 0.44–1.00)
GFR calc Af Amer: >60 mL/min (ref >60)
GFR calc non Af Amer: >60 mL/min (ref >60)
Glucose, Bld: 121 mg/dL — ABNORMAL HIGH (ref 70–99)
Potassium: 4.6 mmol/L (ref 3.5–5.1)
Sodium: 138 mmol/L (ref 135–145)
Total Bilirubin: 0.9 mg/dL (ref 0.3–1.2)
Total Protein: 6.1 g/dL — ABNORMAL LOW (ref 6.5–8.1)

## 2019-01-14 LAB — URINALYSIS, ROUTINE W REFLEX MICROSCOPIC
Bilirubin Urine: NEGATIVE
Glucose, UA: NEGATIVE mg/dL
Hgb urine dipstick: NEGATIVE
Ketones, ur: NEGATIVE mg/dL
Nitrite: POSITIVE — AB
Protein, ur: NEGATIVE mg/dL
Specific Gravity, Urine: 1.012 (ref 1.005–1.030)
WBC, UA: >50 WBC/hpf — ABNORMAL HIGH (ref 0–5)
pH: 5 (ref 5.0–8.0)

## 2019-01-14 LAB — CBC
HCT: 39.3 % (ref 36.0–46.0)
Hemoglobin: 12 g/dL (ref 12.0–15.0)
MCH: 29.9 pg (ref 26.0–34.0)
MCHC: 30.5 g/dL (ref 30.0–36.0)
MCV: 97.8 fL (ref 80.0–100.0)
Platelets: 185 10*3/uL (ref 150–400)
RBC: 4.02 MIL/uL (ref 3.87–5.11)
RDW: 18.9 % — ABNORMAL HIGH (ref 11.5–15.5)
WBC: 12.2 10*3/uL — ABNORMAL HIGH (ref 4.0–10.5)
nRBC: 0 % (ref 0.0–0.2)

## 2019-01-14 LAB — LACTIC ACID, PLASMA: Lactic Acid, Venous: 1.8 mmol/L (ref 0.5–1.9)

## 2019-01-14 LAB — SARS CORONAVIRUS 2 BY RT PCR (HOSPITAL ORDER, PERFORMED IN ~~LOC~~ HOSPITAL LAB): SARS Coronavirus 2: NEGATIVE

## 2019-01-14 LAB — MAGNESIUM: Magnesium: 2 mg/dL (ref 1.7–2.4)

## 2019-01-14 LAB — LIPASE, BLOOD: Lipase: 36 U/L (ref 11–51)

## 2019-01-14 LAB — PROTIME-INR
INR: 1.3 — ABNORMAL HIGH (ref 0.8–1.2)
Prothrombin Time: 16 seconds — ABNORMAL HIGH (ref 11.4–15.2)

## 2019-01-14 MED ORDER — ONDANSETRON HCL 4 MG/2ML IJ SOLN: 4.0000 mg | Freq: Once | INTRAMUSCULAR | Status: DC | PRN

## 2019-01-14 MED ORDER — SODIUM CHLORIDE 0.9 % IV SOLN
1.0000 g | Freq: Once | INTRAVENOUS | Status: AC
  Administered 2019-01-14: 20:00:00 1 g via INTRAVENOUS
  Filled 2019-01-14: qty 10

## 2019-01-14 MED ORDER — DEXTROSE 5 % IV SOLN
60.0000 mg/h | INTRAVENOUS | Status: AC
  Administered 2019-01-14: 20:00:00 60 mg/h via INTRAVENOUS
  Filled 2019-01-14: qty 9

## 2019-01-14 MED ORDER — DEXTROSE 5 % IV SOLN
30.0000 mg/h | INTRAVENOUS | Status: DC
  Administered 2019-01-15: 02:00:00 30 mg/h via INTRAVENOUS
  Filled 2019-01-14 (×3): qty 9

## 2019-01-14 MED ORDER — WARFARIN SODIUM 4 MG PO TABS
8.0000 mg | ORAL_TABLET | Freq: Once | ORAL | Status: AC
  Administered 2019-01-14: 22:00:00 8 mg via ORAL
  Filled 2019-01-14: qty 2

## 2019-01-14 MED ORDER — AMIODARONE LOAD VIA INFUSION
150.0000 mg | Freq: Once | INTRAVENOUS | Status: AC
  Administered 2019-01-14: 20:00:00 150 mg via INTRAVENOUS
  Filled 2019-01-14: qty 83.34

## 2019-01-14 MED ORDER — SODIUM CHLORIDE 0.9% FLUSH: 3.0000 mL | Freq: Once | INTRAVENOUS | Status: DC

## 2019-01-14 MED ORDER — WARFARIN - PHARMACIST DOSING INPATIENT: Freq: Every day | Status: DC

## 2019-01-14 NOTE — ED Provider Notes (Signed)
Called for a code blue.  Pt has a hx of CAD and has an AICD.  She has been admitted and was waiting for a bed upstairs.  Pt had been having runs of VT, but they have been nonsustained.  Pt had an episode of prolonged VT which her defibrillator finally shocked her out of.  She is awake and alert now.  The pt was given amiodarone bolus and infusion and her pacemaker was ordered to be interrogated.  Dr. Jonelle Sidle notified by the nurses.  He will contact cardiology.      Isla Pence, MD 01/14/19 2105

## 2019-01-14 NOTE — Progress Notes (Signed)
ANTICOAGULATION CONSULT NOTE - Initial Consult  Pharmacy Consult for warfarin Indication: atrial fibrillation   Patient Measurements: Height: 5\' 6"  (167.6 cm) Weight: 120 lb (54.4 kg) IBW/kg (Calculated) : 59.3   Vital Signs: Temp: 99.9 F (37.7 C) (05/21 1705) Temp Source: Oral (05/21 1705) BP: 148/83 (05/21 1900) Pulse Rate: 75 (05/21 1800)  Labs: Recent Labs    01/14/19 1548 01/14/19 1549  HGB 12.0  --   HCT 39.3  --   PLT 185  --   LABPROT  --  16.0*  INR  --  1.3*  CREATININE 0.85  --     Assessment:  Admitted with N/v and mental status changes. Appears that patient is followed by palliative care. INR on admit = 1.3. PTA warfarin from outpatient notes: 6 mg po daily  Goal of Therapy:  INR 2-3 Monitor platelets by anticoagulation protocol: Yes    Plan:  -warfarin 8 mg po daily -daily INR  Harvel Quale 01/14/2019,8:17 PM

## 2019-01-14 NOTE — ED Notes (Addendum)
Pt found to have multiple runs of V-tach, Strips printed and placed in chart and at bedside. MD Jonelle Sidle made aware. See new orders.

## 2019-01-14 NOTE — ED Notes (Addendum)
ED TO INPATIENT HANDOFF REPORT  ED Nurse Name and Phone #: Caprice Kluver [phone]  S Name/Age/Gender Angela [name] 73 y.o. female Room/Bed: 040C/040C  Code Status   Code Status: Prior  Home/SNF/Other Home Patient oriented to: self Is this baseline? Yes   Triage Complete: Triage complete  Chief Complaint Generalized Weakness  Triage Note Pt to ED via [facility] with generalized weakness, nausea, vomiting and diarrhea.  Pt reports that she had an abscess removed from buttock/groin area yesterday.    EMS reports that the N/V/D started today and has been weak ever since procedure.  Pt is A&Ox4.   Allergies No Known Allergies  Level of Care/Admitting Diagnosis ED Disposition    ED Disposition Condition [facility] Area: [place] [100100]  Level of Care: Med-Surg [16]  Covid Evaluation: N/A  Diagnosis: Acute metabolic encephalopathy [2706237]  Admitting Physician: Elwyn Reach [[id number]]  Attending Physician: Elwyn Reach [[id number]]  Estimated length of stay: past midnight tomorrow  Certification:: I certify this patient will need inpatient services for at least 2 midnights  PT Class (Do Not Modify): Inpatient [101]  PT Acc Code (Do Not Modify): Private [1]       B Medical/Surgery History Past Medical History:  Diagnosis Date  . Abnormality of gait 08/23/2014  . Arthritis   . Atrial fibrillation ([place])    A.  Chronic Coumadin  . Cardiac arrest - ventricular fibrillation    A.  12/2007;  B. 01/2008 St. Jude Promote Bi-V ICD placed  . CVA (cerebral vascular accident) ([place])   . Diverticular disease   . DVT (deep venous thrombosis) ([place])   . 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 History of colon cancer    A.  1999 - T3, N1  chemotherapy  . Internal hemorrhoids   . Memory deficit 08/23/2014  . Nonischemic cardiomyopathy ([place])    a.  11/2001 - Cath - NL Cors; b. ECHO  09/22/11: EF 20%, MVR normal, moderate LAE, mild RAE c. ECHO (08/2012): ED 20%, diff HK, LA mod dilated, mild/mod TR, RV mild/mod decreased sys fx  . Rectal bleeding 04/28/2017  . Rheumatic heart disease    A. 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 Sick sinus syndrome ([place])    A.  s/p pacer in 1995.  B.    . Systolic CHF, chronic ([place])    A.  01/2008 Echo - EF 10-20%   Past Surgical History:  Procedure Laterality Date  . ABDOMINAL HYSTERECTOMY    . AV NODE ABLATION N/A 09/23/2011   Procedure: AV NODE ABLATION;  Surgeon: Evans Lance, MD;  Location: [facility] CATH LAB;  Service: Cardiovascular;  Laterality: N/A;  . BIV ICD GENERTAOR CHANGE OUT N/A 09/05/2014   Procedure: BIV ICD GENERTAOR CHANGE OUT;  Surgeon: Evans Lance, MD;  Location: [facility] CATH LAB;  Service: Cardiovascular;  Laterality: N/A;  . Biventricular AICD    . COLON SURGERY  1999  . History of echocardiogram  2003, 2007, 2009  . INCISION AND DRAINAGE PERIRECTAL ABSCESS Right 11/28/2018   Procedure: IRRIGATION AND DEBRIDEMENT PERIRECTAL ABSCESS;  Surgeon: Greer Pickerel, MD;  Location: [place];  Service: General;  Laterality: Right;  . Mitral Valve Replacement, Bjork-Shiley valve  1983     A IV Location/Drains/Wounds Patient Lines/Drains/Airways Status   Active Line/Drains/Airways    Name:   Placement date:   Placement time:  Site:   Days:   Peripheral IV 01/14/19 Left Forearm   01/14/19    1432    Forearm   less than 1   Peripheral IV 01/14/19 Left Other (Comment)   01/14/19    1549    Other (Comment)   less than 1   Urethral Catheter Brooke Person RN Straight-tip 14 Fr.   12/06/18    1500    Straight-tip   39   Incision (Closed) 11/28/18 Buttocks Other (Comment)   11/28/18    1115     47          Intake/Output Last 24 hours No intake or output data in the 24 hours ending 01/14/19 1921  Labs/Imaging Results for orders placed or performed during the hospital encounter of 01/14/19 (from the past 48 hour(s))  Lipase, blood     Status:  None   Collection Time: 01/14/19  3:48 PM  Result Value Ref Range   Lipase 36 11 - 51 U/L    Comment: Performed at [facility], [place] [address], [place], [place] [address]  Comprehensive metabolic panel     Status: Abnormal   Collection Time: 01/14/19  3:48 PM  Result Value Ref Range   Sodium 138 135 - 145 mmol/L   Potassium 4.6 3.5 - 5.1 mmol/L    Comment: HEMOLYSIS AT THIS LEVEL MAY AFFECT RESULT   Chloride 106 98 - 111 mmol/L   CO2 22 22 - 32 mmol/L   Glucose, Bld 121 (H) 70 - 99 mg/dL   BUN 16 8 - 23 mg/dL   Creatinine, Ser 0.85 0.44 - 1.00 mg/dL   Calcium 8.7 (L) 8.9 - 10.3 mg/dL   Total Protein 6.1 (L) 6.5 - 8.1 g/dL   Albumin 2.9 (L) 3.5 - 5.0 g/dL   AST 30 15 - 41 U/L   ALT 15 0 - 44 U/L   Alkaline Phosphatase 53 38 - 126 U/L   Total Bilirubin 0.9 0.3 - 1.2 mg/dL   GFR calc non Af Amer >60 >60 mL/min   GFR calc Af Amer >60 >60 mL/min   Anion gap 10 5 - 15    Comment: Performed at [facility], [place] [address], [place], [place] [address]  CBC     Status: Abnormal   Collection Time: 01/14/19  3:48 PM  Result Value Ref Range   WBC 12.2 (H) 4.0 - 10.5 K/uL   RBC 4.02 3.87 - 5.11 MIL/uL   Hemoglobin 12.0 12.0 - 15.0 g/dL   HCT 39.3 36.0 - 46.0 %   MCV 97.8 80.0 - 100.0 fL   MCH 29.9 26.0 - 34.0 pg   MCHC 30.5 30.0 - 36.0 g/dL   RDW 18.9 (H) 11.5 - 15.5 %   Platelets 185 150 - 400 K/uL   nRBC 0.0 0.0 - 0.2 %    Comment: Performed at [facility], [place] [address] [place], [place] [address]  Lactic acid, plasma     Status: None   Collection Time: 01/14/19  3:49 PM  Result Value Ref Range   Lactic Acid, Venous 1.8 0.5 - 1.9 mmol/L    Comment: Performed at [facility] [address], [place], [place] [address]  Protime-INR     Status: Abnormal   Collection Time: 01/14/19  3:49 PM  Result Value Ref Range   Prothrombin Time 16.0 (H) 11.4 - 15.2 seconds   INR 1.3 (H) 0.8 - 1.2    Comment: (  NOTE) INR goal varies based on device and disease  states. Performed at [facility], [place] [address] [place], [place] [address]   SARS Coronavirus 2 (CEPHEID- Performed in [facility] hospital lab), Hosp Order     Status: None   Collection Time: 01/14/19  5:06 PM  Result Value Ref Range   SARS Coronavirus 2 NEGATIVE NEGATIVE    Comment: (NOTE) If result is NEGATIVE SARS-CoV-2 target nucleic acids are NOT DETECTED. The SARS-CoV-2 RNA is generally detectable in upper and lower  respiratory specimens during the acute phase of infection. The lowest  concentration of SARS-CoV-2 viral copies this assay can detect is 250  copies / mL. A negative result does not preclude SARS-CoV-2 infection  and should not be used as the sole basis for treatment or other  patient management decisions.  A negative result may occur with  improper specimen collection / handling, submission of specimen other  than nasopharyngeal swab, presence of viral mutation(s) within the  areas targeted by this assay, and inadequate number of viral copies  (<250 copies / mL). A negative result must be combined with clinical  observations, patient history, and epidemiological information. If result is POSITIVE SARS-CoV-2 target nucleic acids are DETECTED. The SARS-CoV-2 RNA is generally detectable in upper and lower  respiratory specimens dur ing the acute phase of infection.  Positive  results are indicative of active infection with SARS-CoV-2.  Clinical  correlation with patient history and other diagnostic information is  necessary to determine patient infection status.  Positive results do  not rule out bacterial infection or co-infection with other viruses. If result is PRESUMPTIVE POSTIVE SARS-CoV-2 nucleic acids MAY BE PRESENT.   A presumptive positive result was obtained on the submitted specimen  and confirmed on repeat testing.  While 2019 novel coronavirus  (SARS-CoV-2) nucleic acids may be present in the submitted sample  additional confirmatory testing  may be necessary for epidemiological  and / or clinical management purposes  to differentiate between  SARS-CoV-2 and other Sarbecovirus currently known to infect humans.  If clinically indicated additional testing with an alternate test  methodology [phone]) is advised. The SARS-CoV-2 RNA is generally  detectable in upper and lower respiratory sp ecimens during the acute  phase of infection. The expected result is Negative. Fact Sheet for Patients:  StrictlyIdeas.no Fact Sheet for Healthcare Providers: BankingDealers.co.za This test is not yet approved or cleared by the [place] FDA and has been authorized for detection and/or diagnosis of SARS-CoV-2 by FDA under an Emergency Use Authorization (EUA).  This EUA will remain in effect (meaning this test can be used) for the duration of the COVID-19 declaration under Section 564(b)(1) of the Act, 21 U.S.C. section 360bbb-3(b)(1), unless the authorization is terminated or revoked sooner. Performed at [facility], [place] [address] [place], [place] [address]   Urinalysis, Routine w reflex microscopic     Status: Abnormal   Collection Time: 01/14/19  5:29 PM  Result Value Ref Range   Color, Urine YELLOW YELLOW   APPearance HAZY (A) CLEAR   Specific Gravity, Urine 1.012 1.005 - 1.030   pH 5.0 5.0 - 8.0   Glucose, UA NEGATIVE NEGATIVE mg/dL   Hgb urine dipstick NEGATIVE NEGATIVE   Bilirubin Urine NEGATIVE NEGATIVE   Ketones, ur NEGATIVE NEGATIVE mg/dL   Protein, ur NEGATIVE NEGATIVE mg/dL   Nitrite POSITIVE (A) NEGATIVE   Leukocytes,Ua MODERATE (A) NEGATIVE   RBC / HPF 0-5 0 - 5 RBC/hpf  WBC, UA >50 (H) 0 - 5 WBC/hpf   Bacteria, UA MANY (A) NONE SEEN   Squamous Epithelial / LPF 0-5 0 - 5    Comment: Performed at [facility], [place] [address] [place], [place] [address]   Dg Chest Portable 1 View  Result Date: 01/14/2019 CLINICAL DATA:  Nausea and vomiting.  Fever x1  day. EXAM: PORTABLE CHEST 1 VIEW COMPARISON:  Chest x-ray dated 11/26/2018 FINDINGS: Bilateral pacemakers are again noted. The heart size is enlarged. Patient is status post prior median sternotomy and valve replacement. There is no pneumothorax. There is mild generalized volume overload. No large area of consolidation. IMPRESSION: Stable appearance of the chest. Electronically Signed   By: Constance Holster M.D.   On: 01/14/2019 15:27   Dg Abd Portable 1 View  Result Date: 01/14/2019 CLINICAL DATA:  Vomiting and fever EXAM: PORTABLE ABDOMEN - 1 VIEW COMPARISON:  11/30/2018 FINDINGS: The bowel gas pattern is normal. No radio-opaque calculi or other significant radiographic abnormality are seen. There is a mild-to-moderate amount of stool in the colon. Multiple phleboliths project over the patient's pelvis. IMPRESSION: Nonobstructive bowel gas pattern. Electronically Signed   By: Constance Holster M.D.   On: 01/14/2019 15:28    Pending Labs Unresulted Labs (From admission, onward)    Start     Ordered   01/14/19 1451  Culture, blood (routine x 2)  BLOOD CULTURE X 2,   STAT     01/14/19 1451          Vitals/Pain Today's Vitals   01/14/19 1600 01/14/19 1645 01/14/19 1700 01/14/19 1705  BP: 139/82 113/68 121/60   Pulse: 73 71 70   Resp:  (!) 24 (!) 23   Temp:    99.9 F (37.7 C)  TempSrc:    Oral  SpO2: 96% 96% 94%   Weight:      Height:      PainSc:        Isolation Precautions Droplet and Contact precautions  Medications Medications  sodium chloride flush (NS) 0.9 % injection 3 mL (has no administration in time range)  ondansetron (ZOFRAN) injection 4 mg (has no administration in time range)  cefTRIAXone (ROCEPHIN) 1 g in sodium chloride 0.9 % 100 mL IVPB (has no administration in time range)    Mobility  High fall risk   Focused Assessments   R Recommendations: See Admitting Provider Note  Report given to:   Additional Notes:   Pt from home with her husband,  hx of dementia, oriented to self only. Pt has pressure ulcer to sacrum.

## 2019-01-14 NOTE — ED Notes (Signed)
Pt defibrillator fired two more times. MD Opyd at bedside. Spoke with MD Jonelle Sidle on phone several minutes prior. He is aware of previous defibrillator firing.

## 2019-01-14 NOTE — ED Notes (Signed)
ED TO INPATIENT HANDOFF REPORT  ED Nurse Name and Phone #: Myriam Jacobson [phone]  S Name/Age/Gender Angela [name] 73 y.o. female Room/Bed: 040C/040C  Code Status   Code Status: Prior  Home/SNF/Other Home Patient oriented to: self Is this baseline? Yes   Triage Complete: Triage complete  Chief Complaint Generalized Weakness  Triage Note Pt to ED via [facility] with generalized weakness, nausea, vomiting and diarrhea.  Pt reports that she had an abscess removed from buttock/groin area yesterday.    EMS reports that the N/V/D started today and has been weak ever since procedure.  Pt is A&Ox4.   Allergies No Known Allergies  Level of Care/Admitting Diagnosis ED Disposition    ED Disposition Condition [facility] Area: [place] [[id number]]  Level of Care: Telemetry Cardiac [103]  Covid Evaluation: N/A  Diagnosis: Acute metabolic encephalopathy [[id number]]  Admitting Physician: Elwyn Reach [[id number]]  Attending Physician: Elwyn Reach [[id number]]  Estimated length of stay: past midnight tomorrow  Certification:: I certify this patient will need inpatient services for at least 2 midnights  PT Class (Do Not Modify): Inpatient [101]  PT Acc Code (Do Not Modify): Private [1]       B Medical/Surgery History Past Medical History:  Diagnosis Date  . Abnormality of gait 08/23/2014  . Arthritis   . Atrial fibrillation ([place])    A.  Chronic Coumadin  . Cardiac arrest - ventricular fibrillation    A.  12/2007;  B. 01/2008 St. Jude Promote Bi-V ICD placed  . CVA (cerebral vascular accident) ([place])   . Diverticular disease   . DVT (deep venous thrombosis) ([place])   . 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 History of colon cancer    A.  1999 - T3, N1  chemotherapy  . Internal hemorrhoids   . Memory deficit 08/23/2014  . Nonischemic cardiomyopathy ([place])    a.  11/2001 - Cath - NL Cors; b. ECHO  09/22/11: EF 20%, MVR normal, moderate LAE, mild RAE c. ECHO (08/2012): ED 20%, diff HK, LA mod dilated, mild/mod TR, RV mild/mod decreased sys fx  . Rectal bleeding 04/28/2017  . Rheumatic heart disease    A. 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 Sick sinus syndrome ([place])    A.  s/p pacer in 1995.  B.    . Systolic CHF, chronic ([place])    A.  01/2008 Echo - EF 10-20%   Past Surgical History:  Procedure Laterality Date  . ABDOMINAL HYSTERECTOMY    . AV NODE ABLATION N/A 09/23/2011   Procedure: AV NODE ABLATION;  Surgeon: Evans Lance, MD;  Location: [facility] CATH LAB;  Service: Cardiovascular;  Laterality: N/A;  . BIV ICD GENERTAOR CHANGE OUT N/A 09/05/2014   Procedure: BIV ICD GENERTAOR CHANGE OUT;  Surgeon: Evans Lance, MD;  Location: [facility] C[facility] LAB;  Service: Cardiovascular;  Laterality: N/A;  . Biventricular AICD    . COLON SURGERY  1999  . History of echocardiogram  2003, 2007, 2009  . INCISION AND DRAINAGE PERIRECTAL ABSCESS Right 11/28/2018   Procedure: IRRIGATION AND DEBRIDEMENT PERIRECTAL ABSCESS;  Surgeon: [place] Pickerel, MD;  Location: [place];  Service: General;  Laterality: Right;  . Mitral Valve Replacement, Bjork-Shiley valve  1983     A IV Location/Drains/Wounds Patient Lines/Drains/Airways Status   Active Line/Drains/Airways    Name:   Placement date:   Placement time:  Site:   Days:   Peripheral IV 01/14/19 Left Forearm   01/14/19    1432    Forearm   less than 1   Peripheral IV 01/14/19 Left Other (Comment)   01/14/19    1549    Other (Comment)   less than 1   Urethral Catheter Brooke Person RN Straight-tip 14 Fr.   12/06/18    1500    Straight-tip   39   Incision (Closed) 11/28/18 Buttocks Other (Comment)   11/28/18    1115     47          Intake/Output Last 24 hours  Intake/Output Summary (Last 24 hours) at 01/14/2019 2124 Last data filed at 01/14/2019 2028 Gross per 24 hour  Intake 100 ml  Output -  Net 100 ml    Labs/Imaging Results for orders placed or performed during  the hospital encounter of 01/14/19 (from the past 48 hour(s))  Lipase, blood     Status: None   Collection Time: 01/14/19  3:48 PM  Result Value Ref Range   Lipase 36 11 - 51 U/L    Comment: Performed at [facility], [place] [address], [place], [place] [address]  Comprehensive metabolic panel     Status: Abnormal   Collection Time: 01/14/19  3:48 PM  Result Value Ref Range   Sodium 138 135 - 145 mmol/L   Potassium 4.6 3.5 - 5.1 mmol/L    Comment: HEMOLYSIS AT THIS LEVEL MAY AFFECT RESULT   Chloride 106 98 - 111 mmol/L   CO2 22 22 - 32 mmol/L   Glucose, Bld 121 (H) 70 - 99 mg/dL   BUN 16 8 - 23 mg/dL   Creatinine, Ser 0.85 0.44 - 1.00 mg/dL   Calcium 8.7 (L) 8.9 - 10.3 mg/dL   Total Protein 6.1 (L) 6.5 - 8.1 g/dL   Albumin 2.9 (L) 3.5 - 5.0 g/dL   AST 30 15 - 41 U/L   ALT 15 0 - 44 U/L   Alkaline Phosphatase 53 38 - 126 U/L   Total Bilirubin 0.9 0.3 - 1.2 mg/dL   GFR calc non Af Amer >60 >60 mL/min   GFR calc Af Amer >60 >60 mL/min   Anion gap 10 5 - 15    Comment: Performed at [facility], [place] [address], [place], [place] [address]  CBC     Status: Abnormal   Collection Time: 01/14/19  3:48 PM  Result Value Ref Range   WBC 12.2 (H) 4.0 - 10.5 K/uL   RBC 4.02 3.87 - 5.11 MIL/uL   Hemoglobin 12.0 12.0 - 15.0 g/dL   HCT 39.3 36.0 - 46.0 %   MCV 97.8 80.0 - 100.0 fL   MCH 29.9 26.0 - 34.0 pg   MCHC 30.5 30.0 - 36.0 g/dL   RDW 18.9 (H) 11.5 - 15.5 %   Platelets 185 150 - 400 K/uL   nRBC 0.0 0.0 - 0.2 %    Comment: Performed at [facility], [place] [address], [place], [place] [address]  Lactic acid, plasma     Status: None   Collection Time: 01/14/19  3:49 PM  Result Value Ref Range   Lactic Acid, Venous 1.8 0.5 - 1.9 mmol/L    Comment: Performed at [facility] [address] [place], [place] [address]  Protime-INR     Status: Abnormal   Collection Time: 01/14/19  3:49 PM  Result Value Ref Range  Prothrombin Time 16.0 (H) 11.4 - 15.2 seconds   INR  1.3 (H) 0.8 - 1.2    Comment: (NOTE) INR goal varies based on device and disease states. Performed at [facility], [place] [address], [place], [place] [address]   SARS Coronavirus 2 (CEPHEID- Performed in [place] hospital lab), Hosp Order     Status: None   Collection Time: 01/14/19  5:06 PM  Result Value Ref Range   SARS Coronavirus 2 NEGATIVE NEGATIVE    Comment: (NOTE) If result is NEGATIVE SARS-CoV-2 target nucleic acids are NOT DETECTED. The SARS-CoV-2 RNA is generally detectable in upper and lower  respiratory specimens during the acute phase of infection. The lowest  concentration of SARS-CoV-2 viral copies this assay can detect is 250  copies / mL. A negative result does not preclude SARS-CoV-2 infection  and should not be used as the sole basis for treatment or other  patient management decisions.  A negative result may occur with  improper specimen collection / handling, submission of specimen other  than nasopharyngeal swab, presence of viral mutation(s) within the  areas targeted by this assay, and inadequate number of viral copies  (<250 copies / mL). A negative result must be combined with clinical  observations, patient history, and epidemiological information. If result is POSITIVE SARS-CoV-2 target nucleic acids are DETECTED. The SARS-CoV-2 RNA is generally detectable in upper and lower  respiratory specimens dur ing the acute phase of infection.  Positive  results are indicative of active infection with SARS-CoV-2.  Clinical  correlation with patient history and other diagnostic information is  necessary to determine patient infection status.  Positive results do  not rule out bacterial infection or co-infection with other viruses. If result is PRESUMPTIVE POSTIVE SARS-CoV-2 nucleic acids MAY BE PRESENT.   A presumptive positive result was obtained on the submitted specimen  and confirmed on repeat testing.  While 2019 novel coronavirus  (SARS-CoV-2)  nucleic acids may be present in the submitted sample  additional confirmatory testing may be necessary for epidemiological  and / or clinical management purposes  to differentiate between  SARS-CoV-2 and other Sarbecovirus currently known to infect humans.  If clinically indicated additional testing with an alternate test  methodology [phone]) is advised. The SARS-CoV-2 RNA is generally  detectable in upper and lower respiratory sp ecimens during the acute  phase of infection. The expected result is Negative. Fact Sheet for Patients:  StrictlyIdeas.no Fact Sheet for Healthcare Providers: BankingDealers.co.za This test is not yet approved or cleared by the [place] FDA and has been authorized for detection and/or diagnosis of SARS-CoV-2 by FDA under an Emergency Use Authorization (EUA).  This EUA will remain in effect (meaning this test can be used) for the duration of the COVID-19 declaration under Section 564(b)(1) of the Act, 21 U.S.C. section 360bbb-3(b)(1), unless the authorization is terminated or revoked sooner. Performed at [facility], [place] [address] [place], [place] [address]   Urinalysis, Routine w reflex microscopic     Status: Abnormal   Collection Time: 01/14/19  5:29 PM  Result Value Ref Range   Color, Urine YELLOW YELLOW   APPearance HAZY (A) CLEAR   Specific Gravity, Urine 1.012 1.005 - 1.030   pH 5.0 5.0 - 8.0   Glucose, UA NEGATIVE NEGATIVE mg/dL   Hgb urine dipstick NEGATIVE NEGATIVE   Bilirubin Urine NEGATIVE NEGATIVE   Ketones, ur NEGATIVE NEGATIVE mg/dL   Protein, ur NEGATIVE NEGATIVE mg/dL   Nitrite POSITIVE (  A) NEGATIVE   Leukocytes,Ua MODERATE (A) NEGATIVE   RBC / HPF 0-5 0 - 5 RBC/hpf   WBC, UA >50 (H) 0 - 5 WBC/hpf   Bacteria, UA MANY (A) NONE SEEN   Squamous Epithelial / LPF 0-5 0 - 5    Comment: Performed at [facility], [place] [address] [place], [place] [address]  Magnesium      Status: None   Collection Time: 01/14/19  7:50 PM  Result Value Ref Range   Magnesium 2.0 1.7 - 2.4 mg/dL    Comment: Performed at [facility], [place] [address] [place], [place] [address]   Dg Chest Portable 1 View  Result Date: 01/14/2019 CLINICAL DATA:  Nausea and vomiting.  Fever x1 day. EXAM: PORTABLE CHEST 1 VIEW COMPARISON:  Chest x-ray dated 11/26/2018 FINDINGS: Bilateral pacemakers are again noted. The heart size is enlarged. Patient is status post prior median sternotomy and valve replacement. There is no pneumothorax. There is mild generalized volume overload. No large area of consolidation. IMPRESSION: Stable appearance of the chest. Electronically Signed   By: Constance Holster M.D.   On: 01/14/2019 15:27   Dg Abd Portable 1 View  Result Date: 01/14/2019 CLINICAL DATA:  Vomiting and fever EXAM: PORTABLE ABDOMEN - 1 VIEW COMPARISON:  11/30/2018 FINDINGS: The bowel gas pattern is normal. No radio-opaque calculi or other significant radiographic abnormality are seen. There is a mild-to-moderate amount of stool in the colon. Multiple phleboliths project over the patient's pelvis. IMPRESSION: Nonobstructive bowel gas pattern. Electronically Signed   By: Constance Holster M.D.   On: 01/14/2019 15:28    Pending Labs Unresulted Labs (From admission, onward)    Start     Ordered   01/15/19 0500  Protime-INR  Daily,   R     01/14/19 2037   01/14/19 1451  Culture, blood (routine x 2)  BLOOD CULTURE X 2,   STAT     01/14/19 1451   Signed and Held  Comprehensive metabolic panel  Tomorrow morning,   R     Signed and Held   Signed and Held  CBC  Tomorrow morning,   R     Signed and Held          Vitals/Pain Today's Vitals   01/14/19 2030 01/14/19 2040 01/14/19 2047 01/14/19 2055  BP: 120/72 100/67 111/65 117/61  Pulse: 84 76 77 74  Resp: (!) 30 (!) 31 (!) 41 (!) 30  Temp:      TempSrc:      SpO2: 96% 99% 98% 97%  Weight:      Height:      PainSc:        Isolation  Precautions Droplet and Contact precautions  Medications Medications  sodium chloride flush (NS) 0.9 % injection 3 mL (has no administration in time range)  ondansetron (ZOFRAN) injection 4 mg (has no administration in time range)  amiodarone (NEXTERONE) 1.8 mg/mL load via infusion 150 mg (150 mg Intravenous Bolus from Bag 01/14/19 2025)    Followed by  amiodarone (CORDARONE) 450 mg in dextrose 5 % 250 mL infusion (60 mg/hr Intravenous New Bag/Given 01/14/19 2028)    Followed by  amiodarone (CORDARONE) 450 mg in dextrose 5 % 250 mL infusion (has no administration in time range)  warfarin (COUMADIN) tablet 8 mg (has no administration in time range)  Warfarin - Pharmacist Dosing Inpatient (has no administration in time range)  cefTRIAXone (ROCEPHIN) 1 g in sodium chloride 0.9 % 100  mL IVPB (0 g Intravenous Stopped 01/14/19 2028)    Mobility  High fall risk   Focused Assessments Cardiac Assessment Handoff:  Cardiac Rhythm: (Paced) runs of V-tach with several episodes of defibrillator firing.  Lab Results  Component Value Date   CKTOTAL 95 09/22/2011   CKMB 2.6 09/22/2011   TROPONINI 0.30 (HH) 11/27/2018   No results found for: DDIMER Does the Patient currently have chest pain? No     R Recommendations: See Admitting Provider Note  Report given to:   Additional Notes:

## 2019-01-14 NOTE — ED Notes (Signed)
Pt arrives with small amount of stool in buttock wound bed and some vomitus notted at right shoulder. Both cleaned with fresh linen. Report to casey, RN

## 2019-01-14 NOTE — ED Notes (Signed)
Per [employer] pacemaker/defbrillator rep, Pt's defibrillator fired at 1:30 3 episodes of defibrillator firing at 7:40, 7:49,

## 2019-01-14 NOTE — ED Provider Notes (Signed)
[facility] EMERGENCY DEPARTMENT Provider Note   CSN: [id number] Arrival date & time: 01/14/19  1430    History   Chief Complaint Chief Complaint  Patient presents with   Weakness    HPI Angela [name] is a 73 y.o. female.    Level 5 caveat due to dementia. HPI Patient presents with weakness nausea vomiting.  EMS reported these complaints patient does not complain of them.  States that she feels weak.  Reportedly had a wound care nurse change dressing on her wound from previous Fournier's.  Does have some vomitus on her right shoulder does have some stool on her. Past Medical History:  Diagnosis Date   Abnormality of gait 08/23/2014   Arthritis    Atrial fibrillation ([place])    A.  Chronic Coumadin   Cardiac arrest - ventricular fibrillation    A.  12/2007;  B. 01/2008 St. 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  12/2001 iliofemoral embolus s/p r fem embolectomy   History of colon cancer    A.  1999 - T3, N1  chemotherapy   Internal hemorrhoids    Memory deficit 08/23/2014   Nonischemic cardiomyopathy ([place])    a.  11/2001 - Cath - NL Cors; b. ECHO 09/22/11: EF 20%, MVR normal, moderate LAE, mild RAE c. ECHO (08/2012): ED 20%, diff HK, LA mod dilated, mild/mod TR, RV mild/mod decreased sys fx   Rectal bleeding 04/28/2017   Rheumatic heart disease    A. 1983 s/p  Bjork-Shiley MVR   Sick sinus syndrome ([place])    A.  s/p pacer in 1995.  B.     Systolic CHF, chronic ([place])    A.  01/2008 Echo - EF 10-20%    Patient Active Problem List   Diagnosis Date Noted   Protein-calorie malnutrition, severe 12/08/2018   Goals of care, counseling/discussion    Fournier's gangrene in female    Syncope 11/26/2018   Ventricular tachycardia ([place]) 11/26/2018   Elevated INR 11/26/2018   Acute on chronic  systolic (congestive) heart failure ([place]) 10/27/2018   Elevated troponin 10/26/2018   Anemia 10/26/2018   Palliative care by specialist    Rectal bleeding 04/28/2017   Acute blood loss anemia 04/28/2017   ICD (implantable cardioverter-defibrillator), biventricular, in situ 08/24/2014   Memory deficit 08/23/2014   Abnormality of gait 08/23/2014   Dementia ([place]) 12/17/2013   S/P Mechanical MVR (mitral valve replacement) 07/12/2012   Hypotension due to drugs 07/12/2012   Hypokalemia 07/12/2012   NSVT (nonsustained ventricular tachycardia) ([place]) 07/12/2012   Acute on chronic systolic heart failure ([place]) 07/07/2012   Ventricular fibrillation ([place]) 11/11/2011   Warfarin anticoagulation 09/22/2011   Nonischemic cardiomyopathy ([place])    Atrial fibrillation (H[facility])    History of colon cancer    Internal hemorrhoids    Rheumatic heart disease    Sick sinus syndrome ([place])    Systolic CHF, chronic ([place])    DIVERTICULAR DISEASE 06/05/2009    Past Surgical History:  Procedure Laterality Date   ABDOMINAL HYSTERECTOMY     AV NODE ABLATION N/A 09/23/2011   Procedure: AV NODE ABLATION;  Surgeon: Evans Lance, MD;  Location: [facility] CATH LAB;  Service: Cardiovascular;  Laterality: N/A;   BIV ICD GENERTAOR CHANGE OUT N/A 09/05/2014   Procedure: BIV ICD GENERTAOR CHANGE OUT;  Surgeon: Evans Lance, MD;  Location: [facility] CATH LAB;  Service: Cardiovascular;  Laterality: N/A;   Biventricular AICD     COLON SURGERY  1999   History of echocardiogram  2003, 2007, 2009   INCISION AND DRAINAGE PERIRECTAL ABSCESS Right 11/28/2018   Procedure: IRRIGATION AND DEBRIDEMENT PERIRECTAL ABSCESS;  Surgeon: [place] Pickerel, MD;  Location: [facility];  Service: General;  Laterality: Right;   Mitral Valve Replacement, Bjork-Shiley valve  1983     OB History   No obstetric history on file.      Home Medications    Prior to Admission medications   Medication Sig Start Date End Date Taking?  Authorizing Provider  alendronate (FOSAMAX) 70 MG tablet Take 70 mg by mouth once a week. Take with a full glass of water on an empty stomach.    [provider]  amiodarone (PACERONE) 200 MG tablet Take 2 tablets (400 mg total) by mouth daily. 12/08/18   Domenic Polite, MD  carvedilol (COREG) 3.125 MG tablet Take 1 tablet (3.125 mg total) by mouth 2 (two) times daily with a meal. 03/18/18   Evans Lance, MD  cholecalciferol (VITAMIN D) 1000 UNITS tablet Take 2,000 Units by mouth daily.    [provider]  furosemide (LASIX) 40 MG tablet Take 0.5 tablets (20 mg total) by mouth daily as needed for fluid. 12/08/18   Domenic Polite, MD  oxyCODONE (OXY IR/ROXICODONE) 5 MG immediate release tablet Take 1-2 tablets (5-10 mg total) by mouth every 6 (six) hours as needed for moderate pain. 12/08/18   Domenic Polite, MD  warfarin (COUMADIN) 4 MG tablet Take 2 tablets (8 mg total) by mouth daily at 6 PM. Patient taking differently: Take 6 mg by mouth daily at 6 PM.  05/08/17   Murlean Iba, MD    Family History Family History  Problem Relation Age of Onset   Diabetes Mother 74   Coronary artery disease Mother        s/p cabg   Lung cancer Father 6       smoker   Coronary artery disease Sister        s/p cabg    Social History Social History   Tobacco Use   Smoking status: Never Smoker   Smokeless tobacco: Never Used  Substance Use Topics   Alcohol use: No   Drug use: No     Allergies   Patient has no known allergies.   Review of Systems Review of Systems  Unable to perform ROS: Dementia     Physical Exam Updated Vital Signs BP 121/60    Pulse 70    Temp (!) 100.5 F (38.1 C) (Oral)    Resp (!) 23    Ht 5\' 6"  (1.676 m)    Wt 54.4 kg    SpO2 94%    BMI 19.37 kg/m   Physical Exam   ED Treatments / Results  Labs (all labs ordered are listed, but only abnormal results are displayed) Labs Reviewed  COMPREHENSIVE METABOLIC PANEL - Abnormal;  Notable for the following components:      Result Value   Glucose, Bld 121 (*)    Calcium 8.7 (*)    Total Protein 6.1 (*)    Albumin 2.9 (*)    All other components within normal limits  CBC - Abnormal; Notable for the following components:   WBC 12.2 (*)    RDW 18.9 (*)    All other components within normal limits  URINALYSIS, ROUTINE W REFLEX MICROSCOPIC - Abnormal; Notable for the following components:   APPearance HAZY (*)    Nitrite POSITIVE (*)    Leukocytes,Ua MODERATE (*)    WBC, UA >50 (*)    Bacteria, UA MANY (*)    All other components within normal limits  PROTIME-INR - Abnormal; Notable for the following components:   Prothrombin Time 16.0 (*)    INR 1.3 (*)    All other components within normal limits  SARS CORONAVIRUS 2 (HOSPITAL ORDER, [facility])  CULTURE, BLOOD (ROUTINE X 2)  CULTURE, BLOOD (ROUTINE X 2)  LIPASE, BLOOD  LACTIC ACID, PLASMA    EKG EKG Interpretation  Date/Time:  Thursday Jan 14 2019 14:41:39 EDT Ventricular Rate:  76 PR Interval:    QRS Duration: 156 QT Interval:  429 QTC Calculation: 483 R Axis:   77 Text Interpretation:  VENTRICULAR PACING Left bundle branch block Artifact in lead(s) I II aVR Confirmed by Davonna Belling [phone]) on 01/14/2019 3:01:00 PM Also confirmed by Davonna Belling [phone]), editor Philomena Doheny [phone])  on 01/14/2019 3:30:59 PM   Radiology Dg Chest Portable 1 View  Result Date: 01/14/2019 CLINICAL DATA:  Nausea and vomiting.  Fever x1 day. EXAM: PORTABLE CHEST 1 VIEW COMPARISON:  Chest x-ray dated 11/26/2018 FINDINGS: Bilateral pacemakers are again noted. The heart size is enlarged. Patient is status post prior median sternotomy and valve replacement. There is no pneumothorax. There is mild generalized volume overload. No large area of consolidation. IMPRESSION: Stable appearance of the chest. Electronically Signed   By: Constance Holster M.D.   On: 01/14/2019 15:27   Dg Abd  Portable 1 View  Result Date: 01/14/2019 CLINICAL DATA:  Vomiting and fever EXAM: PORTABLE ABDOMEN - 1 VIEW COMPARISON:  11/30/2018 FINDINGS: The bowel gas pattern is normal. No radio-opaque calculi or other significant radiographic abnormality are seen. There is a mild-to-moderate amount of stool in the colon. Multiple phleboliths project over the patient's pelvis. IMPRESSION: Nonobstructive bowel gas pattern. Electronically Signed   By: Constance Holster M.D.   On: 01/14/2019 15:28    Procedures Procedures (including critical care time)  Medications Ordered in ED Medications  sodium chloride flush (NS) 0.9 % injection 3 mL (has no administration in time range)  ondansetron (ZOFRAN) injection 4 mg (has no administration in time range)  cefTRIAXone (ROCEPHIN) 1 g in sodium chloride 0.9 % 100 mL IVPB (has no administration in time range)     Initial Impression / Assessment and Plan / ED Course  I have reviewed the triage vital signs and the nursing notes.  Pertinent labs & imaging results that were available during my care of the patient were reviewed by me and considered in my medical decision making (see chart for details).        Patient presented with reported nausea vomiting and mental status change.  Initially had some mildly low blood pressure but that is improved.  Normal lactic acid.  Was febrile 100.5 orally.  Urinalysis shows infection.  Perineal wound appears to be healing well.  Blood pressure improved.  Doubt this is a severe sepsis at this time.  White count mildly elevated.  Patient is confused however it is difficult to tell what her baseline is.  Will admit to hospitalist.  Final Clinical Impressions(s) / ED Diagnoses   Final diagnoses:  Urinary tract infection without hematuria, site unspecified  Encephalopathy    ED Discharge Orders    None  Davonna Belling, MD 01/14/19 1827

## 2019-01-14 NOTE — ED Notes (Signed)
Pt's O2 dropped to 88%, Pt placed on 2 L [place], sats increased to 98%

## 2019-01-14 NOTE — ED Notes (Signed)
Dr.garba called and said he put orders in

## 2019-01-14 NOTE — H&P (Signed)
History and Physical   Angela [name] TGG:[id number] DOB: August 30, 1945 DOA: 01/14/2019  Referring MD/NP/PA: Dr. Alvino [name]  PCP: Angela Dy, MD   Outpatient Specialists: Dr. Crissie Sickles cardiology  Patient coming from: Home.  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 female with medical history significant of dementia, atrial fibrillation, ischemic cardiomyopathy, nonsustained V. tach with AICD implanted, history of DVT, hypertension, who came to the ER with complaint of generalized weakness.  She has Fournier's gangrene of the perineal area for which she has home health nurses doing dressings.  The nurse went in to see her after dressing yesterday.  She was noted to have nausea and vomiting.  Also has been progressively weak.  Brought to the ER where she is more confused than usual.  Patient not communicating adequately and not giving me adequate history.  Based on her review and evaluation she appears to have UTI.  She is being admitted with altered mental status as well as UTI.  Patient also has had multiple episodes of nonsustained V. tach's in the ER..  ED Course: Temperature is 100.5 with blood pressure 97/65 pulse 114 respiratory 41 oxygen sat 94% on room air.  Patient has a white count of 12.2, Lactic acid 1.8, PT 16 INR 1.3, urinalysis showed positive nitrite many bacteria and WBC more than 50.  Other chemistry and CBC appear to be within normal.  Patient started on IV Rocephin and is being admitted to the hospital for treatment  Review of Systems: As per HPI otherwise 10 point review of systems negative.    Past Medical History:  Diagnosis Date   Abnormality of gait 08/23/2014   Arthritis    Atrial fibrillation ([place])    A.  Chronic Coumadin   Cardiac arrest - ventricular fibrillation    A.  12/2007;  B. 01/2008 St. 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  12/2001 iliofemoral embolus s/p r fem embolectomy   History of colon cancer    A.  1999 - T3, N1  chemotherapy   Internal hemorrhoids    Memory deficit 08/23/2014   Nonischemic cardiomyopathy ([place])    a.  11/2001 - Cath - NL Cors; b. ECHO 09/22/11: EF 20%, MVR normal, moderate LAE, mild RAE c. ECHO (08/2012): ED 20%, diff HK, LA mod dilated, mild/mod TR, RV mild/mod decreased sys fx   Rectal bleeding 04/28/2017   Rheumatic heart disease    A. 1983 s/p  Bjork-Shiley MVR   Sick sinus syndrome ([place])    A.  s/p pacer in 1995.  B.     Systolic CHF, chronic ([place])    A.  01/2008 Echo - EF 10-20%    Past Surgical History:  Procedure Laterality Date   ABDOMINAL HYSTERECTOMY     AV NODE ABLATION N/A 09/23/2011   Procedure: AV NODE ABLATION;  Surgeon: Evans Lance, MD;  Location: [facility] CATH LAB;  Service: Cardiovascular;  Laterality: N/A;   BIV ICD GENERTAOR CHANGE OUT N/A 09/05/2014   Procedure: BIV ICD GENERTAOR CHANGE OUT;  Surgeon: Evans Lance, MD;  Location: [facility] CATH LAB;  Service: Cardiovascular;  Laterality: N/A;   Biventricular AICD     COLON SURGERY  1999   History of echocardiogram  2003,  2007, 2009   INCISION AND DRAINAGE PERIRECTAL ABSCESS Right 11/28/2018   Procedure: IRRIGATION AND DEBRIDEMENT PERIRECTAL ABSCESS;  Surgeon: Greer Pickerel, MD;  Location: [facility];  Service: General;  Laterality: Right;   Mitral Valve Replacement, Bjork-Shiley valve  1983     reports that she has never smoked. She has never used smokeless tobacco. She reports that she does not drink alcohol or use drugs.  No Known Allergies  Family History  Problem Relation Age of Onset   Diabetes Mother 4   Coronary artery disease Mother        s/p cabg   Lung cancer Father 66       smoker   Coronary artery disease Sister        s/p cabg     Prior to Admission medications   Medication Sig  Start Date End Date Taking? Authorizing Provider  alendronate (FOSAMAX) 70 MG tablet Take 70 mg by mouth once a week. Take with a full glass of water on an empty stomach.    [provider]  amiodarone (PACERONE) 200 MG tablet Take 2 tablets (400 mg total) by mouth daily. 12/08/18   Domenic Polite, MD  carvedilol (COREG) 3.125 MG tablet Take 1 tablet (3.125 mg total) by mouth 2 (two) times daily with a meal. 03/18/18   Evans Lance, MD  cholecalciferol (VITAMIN D) 1000 UNITS tablet Take 2,000 Units by mouth daily.    [provider]  furosemide (LASIX) 40 MG tablet Take 0.5 tablets (20 mg total) by mouth daily as needed for fluid. 12/08/18   Domenic Polite, MD  oxyCODONE (OXY IR/ROXICODONE) 5 MG immediate release tablet Take 1-2 tablets (5-10 mg total) by mouth every 6 (six) hours as needed for moderate pain. 12/08/18   Domenic Polite, MD  warfarin (COUMADIN) 4 MG tablet Take 2 tablets (8 mg total) by mouth daily at 6 PM. Patient taking differently: Take 6 mg by mouth daily at 6 PM.  05/08/17   Murlean Iba, MD    Physical Exam: Vitals:   01/14/19 1600 01/14/19 1645 01/14/19 1700 01/14/19 1705  BP: 139/82 113/68 121/60   Pulse: 73 71 70   Resp:  (!) 24 (!) 23   Temp:    99.9 F (37.7 C)  TempSrc:    Oral  SpO2: 96% 96% 94%   Weight:      Height:          Constitutional: Confused, frail, no obvious distress Vitals:   01/14/19 1600 01/14/19 1645 01/14/19 1700 01/14/19 1705  BP: 139/82 113/68 121/60   Pulse: 73 71 70   Resp:  (!) 24 (!) 23   Temp:    99.9 F (37.7 C)  TempSrc:    Oral  SpO2: 96% 96% 94%   Weight:      Height:       Eyes: PERRL, lids and conjunctivae normal ENMT: Mucous membranes are dry. Posterior pharynx clear of any exudate or lesions.Normal dentition.  Neck: normal, supple, no masses, no thyromegaly Respiratory: clear to auscultation bilaterally, no wheezing, no crackles. Normal respiratory effort. No accessory muscle use.   Cardiovascular: Irregularly irregular rhythm, no murmurs / rubs / gallops. No extremity edema. 2+ pedal pulses. No carotid bruits.  Abdomen: no tenderness, no masses palpated. No hepatosplenomegaly. Bowel sounds positive.  Musculoskeletal: no clubbing / cyanosis. No joint deformity upper and lower extremities. Good ROM, no contractures. Normal muscle tone.  Skin: no rashes, lesions, ulcers. No induration Neurologic: CN 2-12 grossly  intact. Sensation intact, DTR normal. Strength 5/5 in all 4.  Psychiatric: Confused, no agitation    Labs on Admission: I have personally reviewed following labs and imaging studies  CBC: Recent Labs  Lab 01/14/19 1548  WBC 12.2*  HGB 12.0  HCT 39.3  MCV 97.8  PLT 532   Basic Metabolic Panel: Recent Labs  Lab 01/14/19 1548  NA 138  K 4.6  CL 106  CO2 22  GLUCOSE 121*  BUN 16  CREATININE 0.85  CALCIUM 8.7*   GFR: Estimated Creatinine Clearance: 49.9 mL/min (by C-G formula based on SCr of 0.85 mg/dL). Liver Function Tests: Recent Labs  Lab 01/14/19 1548  AST 30  ALT 15  ALKPHOS 53  BILITOT 0.9  PROT 6.1*  ALBUMIN 2.9*   Recent Labs  Lab 01/14/19 1548  LIPASE 36   No results for input(s): AMMONIA in the last 168 hours. Coagulation Profile: Recent Labs  Lab 01/14/19 1549  INR 1.3*   Cardiac Enzymes: No results for input(s): CKTOTAL, CKMB, CKMBINDEX, TROPONINI in the last 168 hours. BNP (last 3 results) No results for input(s): PROBNP in the last 8760 hours. HbA1C: No results for input(s): HGBA1C in the last 72 hours. CBG: No results for input(s): GLUCAP in the last 168 hours. Lipid Profile: No results for input(s): CHOL, HDL, LDLCALC, TRIG, CHOLHDL, LDLDIRECT in the last 72 hours. Thyroid Function Tests: No results for input(s): TSH, T4TOTAL, FREET4, T3FREE, THYROIDAB in the last 72 hours. Anemia Panel: No results for input(s): VITAMINB12, FOLATE, FERRITIN, TIBC, IRON, RETICCTPCT in the last 72 hours. Urine analysis:     Component Value Date/Time   COLORURINE YELLOW 01/14/2019 1729   APPEARANCEUR HAZY (A) 01/14/2019 1729   LABSPEC 1.012 01/14/2019 1729   PHURINE 5.0 01/14/2019 1729   GLUCOSEU NEGATIVE 01/14/2019 1729   HGBUR NEGATIVE 01/14/2019 1729   BILIRUBINUR NEGATIVE 01/14/2019 1729   KETONESUR NEGATIVE 01/14/2019 1729   PROTEINUR NEGATIVE 01/14/2019 1729   UROBILINOGEN 0.2 10/23/2010 2318   NITRITE POSITIVE (A) 01/14/2019 1729   LEUKOCYTESUR MODERATE (A) 01/14/2019 1729   Sepsis Labs: @LABRCNTIP (procalcitonin:4,lacticidven:4) ) Recent Results (from the past 240 hour(s))  SARS Coronavirus 2 (CEPHEID- Performed in [place] hospital lab), Hosp Order     Status: None   Collection Time: 01/14/19  5:06 PM  Result Value Ref Range Status   SARS Coronavirus 2 NEGATIVE NEGATIVE Final    Comment: (NOTE) If result is NEGATIVE SARS-CoV-2 target nucleic acids are NOT DETECTED. The SARS-CoV-2 RNA is generally detectable in upper and lower  respiratory specimens during the acute phase of infection. The lowest  concentration of SARS-CoV-2 viral copies this assay can detect is 250  copies / mL. A negative result does not preclude SARS-CoV-2 infection  and should not be used as the sole basis for treatment or other  patient management decisions.  A negative result may occur with  improper specimen collection / handling, submission of specimen other  than nasopharyngeal swab, presence of viral mutation(s) within the  areas targeted by this assay, and inadequate number of viral copies  (<250 copies / mL). A negative result must be combined with clinical  observations, patient history, and epidemiological information. If result is POSITIVE SARS-CoV-2 target nucleic acids are DETECTED. The SARS-CoV-2 RNA is generally detectable in upper and lower  respiratory specimens dur ing the acute phase of infection.  Positive  results are indicative of active infection with SARS-CoV-2.  Clinical  correlation  with patient history and other diagnostic information is  necessary to determine patient infection status.  Positive results do  not rule out bacterial infection or co-infection with other viruses. If result is PRESUMPTIVE POSTIVE SARS-CoV-2 nucleic acids MAY BE PRESENT.   A presumptive positive result was obtained on the submitted specimen  and confirmed on repeat testing.  While 2019 novel coronavirus  (SARS-CoV-2) nucleic acids may be present in the submitted sample  additional confirmatory testing may be necessary for epidemiological  and / or clinical management purposes  to differentiate between  SARS-CoV-2 and other Sarbecovirus currently known to infect humans.  If clinically indicated additional testing with an alternate test  methodology [phone]) is advised. The SARS-CoV-2 RNA is generally  detectable in upper and lower respiratory sp ecimens during the acute  phase of infection. The expected result is Negative. Fact Sheet for Patients:  StrictlyIdeas.no Fact Sheet for Healthcare Providers: BankingDealers.co.za This test is not yet approved or cleared by the [place] FDA and has been authorized for detection and/or diagnosis of SARS-CoV-2 by FDA under an Emergency Use Authorization (EUA).  This EUA will remain in effect (meaning this test can be used) for the duration of the COVID-19 declaration under Section 564(b)(1) of the Act, 21 U.S.C. section 360bbb-3(b)(1), unless the authorization is terminated or revoked sooner. Performed at [facility], [place] [address] [place], [place] [address]      Radiological Exams on Admission: Dg Chest Portable 1 View  Result Date: 01/14/2019 CLINICAL DATA:  Nausea and vomiting.  Fever x1 day. EXAM: PORTABLE CHEST 1 VIEW COMPARISON:  Chest x-ray dated 11/26/2018 FINDINGS: Bilateral pacemakers are again noted. The heart size is enlarged. Patient is status post prior median  sternotomy and valve replacement. There is no pneumothorax. There is mild generalized volume overload. No large area of consolidation. IMPRESSION: Stable appearance of the chest. Electronically Signed   By: Constance Holster M.D.   On: 01/14/2019 15:27   Dg Abd Portable 1 View  Result Date: 01/14/2019 CLINICAL DATA:  Vomiting and fever EXAM: PORTABLE ABDOMEN - 1 VIEW COMPARISON:  11/30/2018 FINDINGS: The bowel gas pattern is normal. No radio-opaque calculi or other significant radiographic abnormality are seen. There is a mild-to-moderate amount of stool in the colon. Multiple phleboliths project over the patient's pelvis. IMPRESSION: Nonobstructive bowel gas pattern. Electronically Signed   By: Constance Holster M.D.   On: 01/14/2019 15:28    EKG: Independently reviewed.  Initial EKG showed ventricular paced rhythm with a rate of 76 and evidence of left bundle branch block.  Since admission patient has had multiple runs of nonsustained V. tach.  Assessment/Plan Principal Problem:   Acute metabolic encephalopathy Active Problems:   Nonischemic cardiomyopathy (HCC)   Atrial fibrillation ([facility])   Systolic CHF, chronic ([facility]CC)   Warfarin anticoagulation   S/P Mechanical MVR (mitral valve replacement)   Dementia (HCC)   ICD (implantable cardioverter-defibrillator), biventricular, in situ   Fournier's gangrene in female   UTI (urinary tract infection)     #1 metabolic encephalopathy: Most likely secondary to UTI.  Patient currently on IV Rocephin.  Urine and blood cultures obtained.  Admit the patient for IV antibiotics and general treatment.  #2 UTI: Continue IV Rocephin with blood and urine cultures.  #3 nonsustained V. tach: Patient has AICD in place.  She had history of nonsustained V. tach.  Magnesium level is normal.  Patient has received a dose of amiodarone in the ER.  Cardiology will be consulted.  #4 dementia: Stable no agitation.  Continue home regimen.  #  5 Fournier's gangrene:  Continue with wound care.  Also antibiotics.    #6 atrial fibrillation: Patient on chronic warfarin therapy.  Continue warfarin per pharmacy.  Currently paced rhythm with multiple runs of V. tach.   DVT prophylaxis: Warfarin Code Status: Full code Family Communication: Patient's husband at home Disposition Plan: To be determined Consults called: Cardiology Admission status: Inpatient to progressive care unit  Severity of Illness: The appropriate patient status for this patient is INPATIENT. Inpatient status is judged to be reasonable and necessary in order to provide the required intensity of service to ensure the patient's safety. The patient's presenting symptoms, physical exam findings, and initial radiographic and laboratory data in the context of their chronic comorbidities is felt to place them at high risk for further clinical deterioration. Furthermore, it is not anticipated that the patient will be medically stable for discharge from the hospital within 2 midnights of admission. The following factors support the patient status of inpatient.   " The patient's presenting symptoms include generalized weakness. " The worrisome physical exam findings include weak and frail. " The initial radiographic and laboratory data are worrisome because of evidence of UTI. " The chronic co-morbidities include ischemic cardiomyopathy.   * I certify that at the point of admission it is my clinical judgment that the patient will require inpatient hospital care spanning beyond 2 midnights from the point of admission due to high intensity of service, high risk for further deterioration and high frequency of surveillance required.Barbette Merino MD [facility] Pager [phone]  If 7PM-7AM, please contact night-coverage [email or web address] Password [facility]  01/14/2019, 7:28 PM

## 2019-01-14 NOTE — ED Notes (Signed)
Pt's HR increased to 248. Pt's defibrillator fired. Pt's pacemaker capturing at this time, HR 75

## 2019-01-14 NOTE — ED Triage Notes (Signed)
Pt to ED via [facility] with generalized weakness, nausea, vomiting and diarrhea.  Pt reports that she had an abscess removed from buttock/groin area yesterday.    EMS reports that the N/V/D started today and has been weak ever since procedure.  Pt is A&Ox4.

## 2019-01-14 NOTE — ED Notes (Signed)
Pt had another run of V-tach that lasted over 1 minute. Pt's defibrillator fired. Pt currently being paced. MD Jonelle Sidle paged, awaiting response

## 2019-01-15 DIAGNOSIS — Z515 Encounter for palliative care: Secondary | ICD-10-CM

## 2019-01-15 DIAGNOSIS — N390 Urinary tract infection, site not specified: Secondary | ICD-10-CM

## 2019-01-15 DIAGNOSIS — I4901 Ventricular fibrillation: Secondary | ICD-10-CM

## 2019-01-15 DIAGNOSIS — K6289 Other specified diseases of anus and rectum: Secondary | ICD-10-CM

## 2019-01-15 DIAGNOSIS — I5022 Chronic systolic (congestive) heart failure: Secondary | ICD-10-CM

## 2019-01-15 DIAGNOSIS — I472 Ventricular tachycardia: Secondary | ICD-10-CM

## 2019-01-15 DIAGNOSIS — N7689 Other specified inflammation of vagina and vulva: Secondary | ICD-10-CM

## 2019-01-15 LAB — COMPREHENSIVE METABOLIC PANEL
ALT: 16 U/L (ref 0–44)
AST: 23 U/L (ref 15–41)
Albumin: 2.7 g/dL — ABNORMAL LOW (ref 3.5–5.0)
Alkaline Phosphatase: 48 U/L (ref 38–126)
Anion gap: 11 (ref 5–15)
BUN: 14 mg/dL (ref 8–23)
CO2: 23 mmol/L (ref 22–32)
Calcium: 8.9 mg/dL (ref 8.9–10.3)
Chloride: 103 mmol/L (ref 98–111)
Creatinine, Ser: 0.88 mg/dL (ref 0.44–1.00)
GFR calc Af Amer: >60 mL/min (ref >60)
GFR calc non Af Amer: >60 mL/min (ref >60)
Glucose, Bld: 133 mg/dL — ABNORMAL HIGH (ref 70–99)
Potassium: 4.3 mmol/L (ref 3.5–5.1)
Sodium: 137 mmol/L (ref 135–145)
Total Bilirubin: 1.2 mg/dL (ref 0.3–1.2)
Total Protein: 6.4 g/dL — ABNORMAL LOW (ref 6.5–8.1)

## 2019-01-15 LAB — CBC
HCT: 35.3 % — ABNORMAL LOW (ref 36.0–46.0)
Hemoglobin: 11.6 g/dL — ABNORMAL LOW (ref 12.0–15.0)
MCH: 30.5 pg (ref 26.0–34.0)
MCHC: 32.9 g/dL (ref 30.0–36.0)
MCV: 92.9 fL (ref 80.0–100.0)
Platelets: 164 10*3/uL (ref 150–400)
RBC: 3.8 MIL/uL — ABNORMAL LOW (ref 3.87–5.11)
RDW: 18.9 % — ABNORMAL HIGH (ref 11.5–15.5)
WBC: 13.9 10*3/uL — ABNORMAL HIGH (ref 4.0–10.5)
nRBC: 0 % (ref 0.0–0.2)

## 2019-01-15 LAB — BLOOD CULTURE ID PANEL (REFLEXED)

## 2019-01-15 LAB — HEPARIN LEVEL (UNFRACTIONATED): Heparin Unfractionated: 0.1 IU/mL — ABNORMAL LOW (ref 0.30–0.70)

## 2019-01-15 LAB — PROTIME-INR
INR: 1.4 — ABNORMAL HIGH (ref 0.8–1.2)
Prothrombin Time: 16.5 seconds — ABNORMAL HIGH (ref 11.4–15.2)

## 2019-01-15 MED ORDER — ONDANSETRON HCL 4 MG/2ML IJ SOLN: 4.0000 mg | Freq: Four times a day (QID) | INTRAMUSCULAR | Status: DC | PRN

## 2019-01-15 MED ORDER — WARFARIN SODIUM 4 MG PO TABS
4.0000 mg | ORAL_TABLET | Freq: Once | ORAL | Status: AC
  Administered 2019-01-15: 17:00:00 4 mg via ORAL
  Filled 2019-01-15: qty 1

## 2019-01-15 MED ORDER — SODIUM CHLORIDE 0.9 % IV SOLN
2.0000 g | Freq: Every day | INTRAVENOUS | Status: DC
  Administered 2019-01-15 – 2019-01-18 (×4): 2 g via INTRAVENOUS
  Filled 2019-01-15 (×4): qty 20

## 2019-01-15 MED ORDER — OXYCODONE HCL 5 MG PO TABS: 5.0000 mg | ORAL_TABLET | Freq: Four times a day (QID) | ORAL | Status: DC | PRN

## 2019-01-15 MED ORDER — VITAMIN D 25 MCG (1000 UNIT) PO TABS
2000.0000 [IU] | ORAL_TABLET | Freq: Every day | ORAL | Status: DC
  Administered 2019-01-15: 11:00:00 2000 [IU] via ORAL
  Filled 2019-01-15: qty 2

## 2019-01-15 MED ORDER — ONDANSETRON HCL 4 MG PO TABS: 4.0000 mg | ORAL_TABLET | Freq: Four times a day (QID) | ORAL | Status: DC | PRN

## 2019-01-15 MED ORDER — FUROSEMIDE 20 MG PO TABS: 20.0000 mg | ORAL_TABLET | Freq: Every day | ORAL | Status: DC | PRN

## 2019-01-15 MED ORDER — CARVEDILOL 3.125 MG PO TABS
3.1250 mg | ORAL_TABLET | Freq: Two times a day (BID) | ORAL | Status: DC
  Administered 2019-01-15 – 2019-01-19 (×10): 3.125 mg via ORAL
  Filled 2019-01-15 (×10): qty 1

## 2019-01-15 MED ORDER — HEPARIN (PORCINE) 25000 UT/250ML-% IV SOLN
1200.0000 [IU]/h | INTRAVENOUS | Status: DC
  Administered 2019-01-15: 11:00:00 800 [IU]/h via INTRAVENOUS
  Administered 2019-01-16: 10:00:00 1050 [IU]/h via INTRAVENOUS
  Administered 2019-01-17 – 2019-01-18 (×2): 1200 [IU]/h via INTRAVENOUS
  Filled 2019-01-15 (×4): qty 250

## 2019-01-15 MED ORDER — ALENDRONATE SODIUM 70 MG PO TABS: 70.0000 mg | ORAL_TABLET | ORAL | Status: DC

## 2019-01-15 MED ORDER — HEPARIN BOLUS VIA INFUSION
1600.0000 [IU] | Freq: Once | INTRAVENOUS | Status: AC
  Administered 2019-01-15: 11:00:00 1600 [IU] via INTRAVENOUS
  Filled 2019-01-15: qty 1600

## 2019-01-15 MED ORDER — SODIUM CHLORIDE 0.9 % IV SOLN: 1.0000 g | INTRAVENOUS | Status: DC

## 2019-01-15 MED ORDER — SODIUM CHLORIDE 0.9 % IV SOLN
INTRAVENOUS | Status: DC
  Administered 2019-01-15 – 2019-01-17 (×3): via INTRAVENOUS

## 2019-01-15 MED ORDER — SODIUM CHLORIDE 0.9% FLUSH: 3.0000 mL | INTRAVENOUS | Status: DC | PRN

## 2019-01-15 MED ORDER — SODIUM CHLORIDE 0.9% FLUSH
3.0000 mL | Freq: Two times a day (BID) | INTRAVENOUS | Status: DC
  Administered 2019-01-15 – 2019-01-18 (×7): 3 mL via INTRAVENOUS

## 2019-01-15 MED ORDER — SODIUM CHLORIDE 0.9 % IV SOLN: 250.0000 mL | INTRAVENOUS | Status: DC | PRN

## 2019-01-15 MED ORDER — AMIODARONE HCL 200 MG PO TABS
400.0000 mg | ORAL_TABLET | Freq: Every day | ORAL | Status: DC
  Administered 2019-01-15 – 2019-01-16 (×2): 400 mg via ORAL
  Filled 2019-01-15 (×3): qty 2

## 2019-01-15 NOTE — Progress Notes (Signed)
PHARMACY - PHYSICIAN COMMUNICATION CRITICAL VALUE ALERT - BLOOD CULTURE IDENTIFICATION (BCID)  Maryrose Disbro is an 73 y.o. female who presented to [facility] on 01/14/2019 with a chief complaint of AMS/urosepsis  Assessment:  2/2 blood cultures growing E. Coli  Name of physician (or Provider) Contacted: X. Blount  Current antibiotics: Rocephin  Changes to prescribed antibiotics recommended:   Increase Rocephin 2 g IV q24h  No results found for this or any previous visit.  Caryl Pina 01/15/2019  4:39 AM

## 2019-01-15 NOTE — ED Notes (Addendum)
Pt's defibrillator fired due to pt in V-tach with HR of 250. MD Jonelle Sidle paged. EDP [place] at bedside.

## 2019-01-15 NOTE — Consult Note (Signed)
Cardiology Consult Note    Patient ID: [name] MRN: [id number], DOB: February 06, 1946 Date of Encounter: 01/15/2019, 12:37 AM Primary Physician: Lorene Dy, MD  Chief Complaint: AMS Reason for Consult: ICD shock  HPI: [name] is a 73 y.o. female with history of RHD s/p Bjork-Shiley MVR (1983), niCMP (EF 20%), VF arrest s/p St. Jude Bi-V ICD, AF, and dementia, who presents with altered mental status.  Of note, pt is altered upon my evaluation, and thus, most of the history has been gleaned from chart review.  She has been undergoing treatment for Fournier's gangrene with home health wound care recentl.  She was noted to be progressively more weak, more confused than usual, and with increasing nausea and vomiting over the last 1-2 days.  She was sent to the [facility] ED for further evaluation.  In the ED, she was febrile to 100.5.  Labs were notable for  Cr 0.85, normal lactate, WBC 12.2, and INR 1.3.  COVID screen was negative.  Urine showed evidence of UTI, so she was given a dose of CTX.  While being monitored in the ED, she had several appropriate shocks for VT/VF.  She was given an amiodarone bolus and started on an amiodarone gtt.  Her Mg and K were already >2 and >4, respectively.   Past Medical History:  Diagnosis Date   Abnormality of gait 08/23/2014   Arthritis    Atrial fibrillation ([place])    A.  Chronic Coumadin   Cardiac arrest - ventricular fibrillation    A.  12/2007;  B. 01/2008 St. 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  12/2001 iliofemoral embolus s/p r fem embolectomy   History of colon cancer    A.  1999 - T3, N1  chemotherapy   Internal hemorrhoids    Memory deficit 08/23/2014   Nonischemic cardiomyopathy ([place])    a.  11/2001 - Cath - NL Cors; b. ECHO 09/22/11: EF 20%, MVR normal, moderate LAE, mild RAE c. ECHO  (08/2012): ED 20%, diff HK, LA mod dilated, mild/mod TR, RV mild/mod decreased sys fx   Rectal bleeding 04/28/2017   Rheumatic heart disease    A. 1983 s/p  Bjork-Shiley MVR   Sick sinus syndrome ([place])    A.  s/p pacer in 1995.  B.     Systolic CHF, chronic ([place])    A.  01/2008 Echo - EF 10-20%     Surgical History:  Past Surgical History:  Procedure Laterality Date   ABDOMINAL HYSTERECTOMY     AV NODE ABLATION N/A 09/23/2011   Procedure: AV NODE ABLATION;  Surgeon: Evans Lance, MD;  Location[facility] C[facility] LAB;  Service: Cardiovascular;  Laterality: N/A;   BIV ICD GENERTAOR CHANGE OUT N/A 09/05/2014   Procedure: BIV ICD GENERTAOR CHANGE OUT;  Surgeon: Evans Lance, MD;  Location: [facility] CATH LAB;  Service: Cardiovascular;  Laterality: N/A;   Biventricular AICD     COLON SURGERY  1999   History of echocardiogram  2003, 2007, 2009   INCISION AND DRAINAGE PERIRECTAL ABSCESS Right 11/28/2018   Procedure: IRRIGATION AND DEBRIDEMENT PERIRECTAL ABSCESS;  Surgeon: Greer Pickerel, MD;  Location: [facility];  Service: General;  Laterality: Right;   Mitral Valve Replacement, Bjork-Shiley valve  1983     Home Meds: Prior to Admission medications  Medication Sig Start Date End Date Taking? Authorizing Provider  alendronate (FOSAMAX) 70 MG tablet Take 70 mg by mouth once a week. Take with a full glass of water on an empty stomach.    [provider]  amiodarone (PACERONE) 200 MG tablet Take 2 tablets (400 mg total) by mouth daily. 12/08/18   Domenic Polite, MD  carvedilol (COREG) 3.125 MG tablet Take 1 tablet (3.125 mg total) by mouth 2 (two) times daily with a meal. 03/18/18   Evans Lance, MD  cholecalciferol (VITAMIN D) 1000 UNITS tablet Take 2,000 Units by mouth daily.    [provider]  furosemide (LASIX) 40 MG tablet Take 0.5 tablets (20 mg total) by mouth daily as needed for fluid. 12/08/18   Domenic Polite, MD  oxyCODONE (OXY IR/ROXICODONE) 5 MG immediate release tablet  Take 1-2 tablets (5-10 mg total) by mouth every 6 (six) hours as needed for moderate pain. 12/08/18   Domenic Polite, MD  warfarin (COUMADIN) 4 MG tablet Take 2 tablets (8 mg total) by mouth daily at 6 PM. Patient taking differently: Take 6 mg by mouth daily at 6 PM.  05/08/17   Murlean Iba, MD    Allergies: No Known Allergies  Social History   Socioeconomic History   Marital status: Married    Spouse name: Not on file   Number of children: 1   Years of education: Not on file   Highest education level: Not on file  Occupational History   Occupation: Retired    Fish farm manager: RETIRED  Scientist, product/process development strain: Not on file   Food insecurity:    Worry: Not on file    Inability: Not on file   Transportation needs:    Medical: Not on file    Non-medical: Not on file  Tobacco Use   Smoking status: Never Smoker   Smokeless tobacco: Never Used  Substance and Sexual Activity   Alcohol use: No   Drug use: No   Sexual activity: Never  Lifestyle   Physical activity:    Days per week: Not on file    Minutes per session: Not on file   Stress: Not on file  Relationships   Social connections:    Talks on phone: Not on file    Gets together: Not on file    Attends religious service: Not on file    Active member of club or organization: Not on file    Attends meetings of clubs or organizations: Not on file    Relationship status: Not on file   Intimate partner violence:    Fear of current or ex partner: Not on file    Emotionally abused: Not on file    Physically abused: Not on file    Forced sexual activity: Not on file  Other Topics Concern   Not on file  Social History Narrative   The patient is married and has 1 child.  Lives in [name] with husband.  She is retired from working at [employer].  She does not smoke or drink     Family History  Problem Relation Age of Onset   Diabetes Mother 50   Coronary artery disease Mother         s/p cabg   Lung cancer Father 39       smoker   Coronary artery disease Sister        s/p cabg    Review of Systems: All other systems reviewed  and are otherwise negative except as noted above.  Labs:   Lab Results  Component Value Date   WBC 12.2 (H) 01/14/2019   HGB 12.0 01/14/2019   HCT 39.3 01/14/2019   MCV 97.8 01/14/2019   PLT 185 01/14/2019    Recent Labs  Lab 01/14/19 1548  NA 138  K 4.6  CL 106  CO2 22  BUN 16  CREATININE 0.85  CALCIUM 8.7*  PROT 6.1*  BILITOT 0.9  ALKPHOS 53  ALT 15  AST 30  GLUCOSE 121*   No results for input(s): CKTOTAL, CKMB, TROPONINI in the last 72 hours. No results found for: CHOL, HDL, LDLCALC, TRIG No results found for: DDIMER  Radiology/Studies:  Dg Chest Portable 1 View  Result Date: 01/14/2019 CLINICAL DATA:  Nausea and vomiting.  Fever x1 day. EXAM: PORTABLE CHEST 1 VIEW COMPARISON:  Chest x-ray dated 11/26/2018 FINDINGS: Bilateral pacemakers are again noted. The heart size is enlarged. Patient is status post prior median sternotomy and valve replacement. There is no pneumothorax. There is mild generalized volume overload. No large area of consolidation. IMPRESSION: Stable appearance of the chest. Electronically Signed   By: Constance Holster M.D.   On: 01/14/2019 15:27   Dg Abd Portable 1 View  Result Date: 01/14/2019 CLINICAL DATA:  Vomiting and fever EXAM: PORTABLE ABDOMEN - 1 VIEW COMPARISON:  11/30/2018 FINDINGS: The bowel gas pattern is normal. No radio-opaque calculi or other significant radiographic abnormality are seen. There is a mild-to-moderate amount of stool in the colon. Multiple phleboliths project over the patient's pelvis. IMPRESSION: Nonobstructive bowel gas pattern. Electronically Signed   By: Constance Holster M.D.   On: 01/14/2019 15:28   Wt Readings from Last 3 Encounters:  01/15/19 63.7 kg  12/08/18 59.8 kg  11/05/18 65 kg    EKG: AF, V-paced  Physical Exam: Blood pressure 96/67, pulse 72,  temperature 99.9 F (37.7 C), temperature source Oral, resp. rate (!) 21, height 5\' 6"  (1.676 m), weight 63.7 kg, SpO2 91 %. Body mass index is 22.67 kg/m. General: Chronically ill appearing, lying in bed, responds to name only. Head: Normocephalic, atraumatic, sclera non-icteric, no xanthomas, nares are without discharge.  Neck: Negative for carotid bruits. JVD not elevated. Lungs: Clear bilaterally to auscultation without wheezes, rales, or rhonchi. Breathing is unlabored. Heart: RRR with S1 S2. No murmurs, rubs, or gallops appreciated. Abdomen: Soft, non-tender, non-distended with normoactive bowel sounds. No hepatomegaly. No rebound/guarding. No obvious abdominal masses. Msk:  Strength and tone appear normal for age. Extremities: No clubbing or cyanosis. No edema.  Distal pedal pulses are 2+ and equal bilaterally. 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  While being monitored in ED, she had several appropriate shocks for VT/VF.  1.  ICD shocks: Would continue IV amiodarone gtt for the time being.  If further VT overnight, would bolus 50 mg lidocaine and start lidocaine gtt at 1 mg/min, in addition to amiodarone.  Pt previously had no significant CAD.  Given provoking factor of acute infection, would defer repeat ischemic eval at present.  2.  S/p mechanical MVR: INR 1.3, would bridge with IV heparin.  3.  Chronic systolic heart failure:  Plan to hold carvedilol and furosemide given borderline BP and concern for septic physiology.  We will continue to follow.  Signed, Doylene Canning, MD 01/15/2019, 12:37 AM

## 2019-01-15 NOTE — Progress Notes (Signed)
. [facility] PROGRESS NOTE  [name] MRN:[id number] DOB: 10/11/1945 DOA: 01/14/2019 PCP: Roberts, Ronald, MD Narrative: 72-year-old African-American female Significant cardiac comorbidities left bundle branch block chronic systolic heart failure VF arrest complete heart block status post AV nodal ablation Baseline ejection fraction 15% Mechanical mitral valve 1983 GI bleed 05/08/2017 Prior history of colon cancer Stage VII dementia  Recent hospitalization 4/2-4/14 2020 with fourniers gangrene--- patient was admitted with VF shocks from surgeon's office while she was being evaluated for her wound Cardiology saw her she was on a lidocaine and amiodarone drip at that time and was sent home on Coumadin At that admission palliative care saw the patient and they were not apparently receptive to palliative services She was readmitted 5/21 as per the history listed by me earlier today not feeling well more confused and passed out again and she had multiple episodes of nonsustained SVT/VT in the hospital On admission she met sepsis criteria lactic acid 1.8 white count 12 T-max 100.5 BP 97/64 respiratory rate 40   A & Plan Severe sepsis Growing gram-negative rods in both bottles Looks like E. coli again-increasing to ceftriaxone 2 g daily Monitor for resolution of sepsis physiology Repeat labs in the morning Saline at 50 cc an hour Multiple cardiac conditions including A. fib sick sinus syndrome pacemaker placement nonischemic cardiomyopathy Defer to cardiology-continue amiodarone GTT, Coreg 3.125 twice daily INR subtherapeutic therefore bridging with Coumadin while con commitment Coumadin dosing per pharmacist  Recent treatment of fopurnier'sgangrene Wound care as needed-area reviewed by myself on 5/22 looks reasonably clean Prior GI bleed and colon cancer history Monitor labs a.m. no overt signs of bleeding Profound dementia, stage VII Moderate malnutrition BMI is 22 Family has  exceedingly poor insight Appreciate palliative medicine input and trying to delineate goals   DVT Heparin and Coumadin code Status: Patient remains full code communication: Long discussion 5/20 2 AM with husband disposition Plan: Inpatient at this time   Samtani, MD  [facility] Via amion app OR [email or web address] 7PM-7AM contact night coverage as above 01/15/2019, 2:52 PM  LOS: 1 day   Consultants:  Cardiology  Procedures:  None at this time  Antimicrobials:  Ceftriaxone  Interval history/Subjective: Confused and cannot obtain ROS  Objective:  Vitals:  Vitals:   01/15/19 0604 01/15/19 0750  BP: 116/82 (!) 94/58  Pulse: 72 73  Resp: 16 (!) 23  Temp:    SpO2: 96% 93%    Exam:  Alert pleasant but confused EOMI NCAT arcus senilis No pallor no icterus Throat soft supple Abdomen nonobese nontender no organomegaly No lower extremity edema She does have a wound from her prior Fournier's gangrene that is not malodorous and does not seem to have purulence I will follow-up with pictures in a.m. Confused incongruent   I have personally reviewed the following:  DATA   Labs:   white count 12.2-->13.9   hemoglobin down from 12.0-11.6  Platelets 160s  INR 1.4   Review and summation of old records:  Summarized extensively  Scheduled Meds: . amiodarone  400 mg Oral Daily  . carvedilol  3.125 mg Oral BID WC  . cholecalciferol  2,000 Units Oral Daily  . sodium chloride flush  3 mL Intravenous Once  . sodium chloride flush  3 mL Intravenous Q12H  . warfarin  4 mg Oral ONCE-1800  . Warfarin - Pharmacist Dosing Inpatient   Does not apply q1800   Continuous Infusions: . sodium chloride    . sodium chloride 50 mL/hr at 01/15/19   0934  . amiodarone (CORDARONE) infusion 30 mg/hr (01/15/19 0221)  . cefTRIAXone (ROCEPHIN)  IV 2 g (01/15/19 0649)  . heparin 800 Units/hr (01/15/19 1123)    Principal Problem:   Acute metabolic encephalopathy Active  Problems:   Nonischemic cardiomyopathy (HCC)   Atrial fibrillation (HCC)   Systolic CHF, chronic ([facility])   Warfarin anticoagulation   S/P Mechanical MVR (mitral valve replacement)   NSVT (nonsustained ventricular tachycardia) ([facility]CC)   Dementia ([facility])   ICD (implantable cardioverter-defibrillator), biventricular, in situ   Fournier's gangrene in female   UTI (urinary tract infection)   LOS: 1 day          

## 2019-01-15 NOTE — Consult Note (Addendum)
Cardiology Consultation:   Patient ID: Angela [name] MRN: [id number]; DOB: 1946/02/12  Admit date: 01/14/2019 Date of Consult: 01/15/2019  Primary Care Provider: Lorene Dy, MD Primary Cardiologist: Cristopher Peru, MD     Patient Profile:   Angela [name] is a 73 y.o. 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 Beryle Lathe.  Device information: SJM CRT- ICD, implanted 2009, gen change 09/05/14 Patient has an abandoned pacing device system by CXR  History of Present Illness:   Ms. Fister in April 2020 was hospitalized after a syncopal event while at her surgeons office, with observed ICD shocks, at that time being treated as well for perirectal infection, CT noted showed multiple foci of gas concerning for possible Fournier's gangrene.  She had at that time appropriate shocks for PMVT 4 episodes all successfully terminated with one shock.  She was started on amio at that time, suspected provoked by acute infection issues eventually transitioned to PO (and discharged on 400mg  daily).  She had a virtual visit 01/07/2019 with Dr. Lovena Le, at that time the patient reported that she had stopped the amiodarone, and stopped her coreg 2/2 low BP and reportedly healing well from her debridement/surgery done 2/2 the fournier's gangrene in April  She was readmitted yesterday 2/2 concerns of AMS, noted by her home visiting nurse, with of late progressive weakness, and development of N/V. while in the ER and being admitted with low grade temp 100.5, and UTI, she was observe to develop episodes of VT with ICD shocks, started on amiodarone gtt  Cardiology was consulted.  Merlin transmission reviewed Battery and lead  measurements are good 93%BVe pacing Yesterday she had 4 appropriate and successful shocks (x1 each) for VF One NSVT event as well  LABS K+ 4.6 Mag 2.0 BUN/Creat 16/0.85 Lacic acid 1.8 WBC 12.2 >> 13.9 H/H 12/39 Plts 185 INR 1.3 >> 1.4 (started on heparin gtt here)  Pt remains confused, unable to obtain reliable information or ROS.  PMHx and HPI are obtained entirely from the chart  Past Medical History:  Diagnosis Date  . Abnormality of gait 08/23/2014  . Arthritis   . Atrial fibrillation ([place])    A.  Chronic Coumadin  . Cardiac arrest - ventricular fibrillation    A.  12/2007;  B. 01/2008 St. Jude Promote Bi-V ICD placed  . CVA (cerebral vascular accident) ([place])   . Diverticular disease   . DVT (deep venous thrombosis) ([place])   . 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 History of colon cancer    A.  1999 - T3, N1  chemotherapy  . Internal hemorrhoids   . Memory deficit 08/23/2014  . Nonischemic cardiomyopathy ([place])    a.  11/2001 - Cath - NL Cors; b. ECHO 09/22/11: EF 20%, MVR normal, moderate LAE, mild RAE c. ECHO (08/2012): ED 20%, diff HK, LA mod dilated, mild/mod TR, RV mild/mod decreased sys fx  . Rectal bleeding 04/28/2017  . Rheumatic heart disease    A. 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 Sick sinus syndrome ([place])    A.  s/p pacer in 1995.  B.    .  Systolic CHF, chronic ([place])    A.  01/2008 Echo - EF 10-20%    Past Surgical History:  Procedure Laterality Date  . ABDOMINAL HYSTERECTOMY    . AV NODE ABLATION N/A 09/23/2011   Procedure: AV NODE ABLATION;  Surgeon: Evans Lance, MD;  Location: [facility] CATH LAB;  Service: Cardiovascular;  Laterality: N/A;  . BIV ICD GENERTAOR CHANGE OUT N/A 09/05/2014   Procedure: BIV ICD GENERTAOR CHANGE OUT;  Surgeon: Evans Lance, MD;  Location: [facility] CATH LAB;  Service: Cardiovascular;  Laterality: N/A;  . Biventricular AICD    . COLON SURGERY  1999  . History of echocardiogram   2003, 2007, 2009  . INCISION AND DRAINAGE PERIRECTAL ABSCESS Right 11/28/2018   Procedure: IRRIGATION AND DEBRIDEMENT PERIRECTAL ABSCESS;  Surgeon: Greer Pickerel, MD;  Location: [place];  Service: General;  Laterality: Right;  . Mitral Valve Replacement, Bjork-Shiley valve  1983     Home Medications:  Prior to Admission medications   Medication Sig Start Date End Date Taking? Authorizing Provider  alendronate (FOSAMAX) 70 MG tablet Take 70 mg by mouth once a week. Take with a full glass of water on an empty stomach.    [provider]  amiodarone (PACERONE) 200 MG tablet Take 2 tablets (400 mg total) by mouth daily. 12/08/18   Domenic Polite, MD  carvedilol (COREG) 3.125 MG tablet Take 1 tablet (3.125 mg total) by mouth 2 (two) times daily with a meal. 03/18/18   Evans Lance, MD  cholecalciferol (VITAMIN D) 1000 UNITS tablet Take 2,000 Units by mouth daily.    [provider]  furosemide (LASIX) 40 MG tablet Take 0.5 tablets (20 mg total) by mouth daily as needed for fluid. 12/08/18   Domenic Polite, MD  oxyCODONE (OXY IR/ROXICODONE) 5 MG immediate release tablet Take 1-2 tablets (5-10 mg total) by mouth every 6 (six) hours as needed for moderate pain. 12/08/18   Domenic Polite, MD  warfarin (COUMADIN) 4 MG tablet Take 2 tablets (8 mg total) by mouth daily at 6 PM. Patient taking differently: Take 6 mg by mouth daily at 6 PM.  05/08/17   Murlean Iba, MD    Inpatient Medications: Scheduled Meds: . amiodarone  400 mg Oral Daily  . carvedilol  3.125 mg Oral BID WC  . cholecalciferol  2,000 Units Oral Daily  . sodium chloride flush  3 mL Intravenous Once  . sodium chloride flush  3 mL Intravenous Q12H  . warfarin  4 mg Oral ONCE-1800  . Warfarin - Pharmacist Dosing Inpatient   Does not apply q1800   Continuous Infusions: . sodium chloride    . sodium chloride 50 mL/hr at 01/15/19 0934  . amiodarone (CORDARONE) infusion 30 mg/hr (01/15/19 0221)  . cefTRIAXone  (ROCEPHIN)  IV 2 g (01/15/19 0649)  . heparin 800 Units/hr (01/15/19 1123)   PRN Meds: sodium chloride, ondansetron (ZOFRAN) IV, ondansetron **OR** ondansetron (ZOFRAN) IV, oxyCODONE, sodium chloride flush  Allergies:   No Known Allergies  Social History:   Social History   Socioeconomic History  . Marital status: Married    Spouse name: Not on file  . Number of children: 1  . Years of education: Not on file  . Highest education level: Not on file  Occupational History  . Occupation: Retired    Fish farm manager: RETIRED  Social Needs  . Financial resource strain: Not on file  . Food insecurity:    Worry: Not on file    Inability: Not  on file  . Transportation needs:    Medical: Not on file    Non-medical: Not on file  Tobacco Use  . Smoking status: Never Smoker  . Smokeless tobacco: Never Used  Substance and Sexual Activity  . Alcohol use: No  . Drug use: No  . Sexual activity: Never  Lifestyle  . Physical activity:    Days per week: Not on file    Minutes per session: Not on file  . Stress: Not on file  Relationships  . Social connections:    Talks on phone: Not on file    Gets together: Not on file    Attends religious service: Not on file    Active member of club or organization: Not on file    Attends meetings of clubs or organizations: Not on file    Relationship status: Not on file  . Intimate partner violence:    Fear of current or ex partner: Not on file    Emotionally abused: Not on file    Physically abused: Not on file    Forced sexual activity: Not on file  Other Topics Concern  . Not on file  Social History Narrative   The patient is married and has 1 child.  Lives in [place] with husband.  She is retired from working at [employer].  She does not smoke or drink    Family History:   Family History  Problem Relation Age of Onset  . Diabetes Mother 65  . Coronary artery disease Mother        s/p cabg  . Lung cancer Father 66       smoker  .  Coronary artery disease Sister        s/p cabg     ROS:  Please see the history of present illness.  All other ROS reviewed and negative.     Physical Exam/Data:   Vitals:   01/15/19 0533 01/15/19 0558 01/15/19 0604 01/15/19 0750  BP: (!) 91/59  116/82 (!) 94/58  Pulse: 73  72 73  Resp: (!) 21  16 (!) 23  Temp: 97.9 F (36.6 C)     TempSrc: Oral     SpO2: 94%  96% 93%  Weight:  63.7 kg    Height:        Intake/Output Summary (Last 24 hours) at 01/15/2019 1130 Last data filed at 01/15/2019 0600 Gross per 24 hour  Intake 103 ml  Output 200 ml  Net -97 ml   Last 3 Weights 01/15/2019 01/15/2019 01/14/2019  Weight (lbs) 140 lb 6.9 oz 140 lb 6.9 oz 120 lb  Weight (kg) 63.7 kg 63.7 kg 54.432 kg     Body mass index is 22.67 kg/m.  General:  Well nourished, well developed, in NAD HEENT: normal Lymph: not examined Neck: no JVD Endocrine:  Not examined Vascular: No carotid bruits  Cardiac:  RRR; mechanical valve apprecited Lungs:  CTA b/l, no wheezing, rhonchi or rales  Abd: not examined  Ext: no edema Musculoskeletal:  No obvious deformities Skin: warm and dry  Neuro:  Remains somewhat altered Psych:  Patient does not offer much in the way of verbal interaction   EKG:  The EKG was personally reviewed and demonstrates:   Vpaced Telemetry:  Telemetry was personally reviewed and demonstrates:   V paced, occ PVCs, rare NSVt longest 6 beats  Relevant CV Studies:  10/27/2018: TTE IMPRESSIONS 1. The left ventricle has a visually estimated ejection fraction of of 15-20%.  The cavity size was severely dilated. Left ventricular diastolic Doppler parameters are indeterminate due to mitral valve replacement/repair. Left ventricular diffuse  hypokinesis. 2. The right ventricle has moderately reduced systolic function. The cavity was normal. There is no increase in right ventricular wall thickness. 3. Left atrial size was severely dilated. 4. Right atrial size was severely  dilated. 5. A mechanical valve is present in the mitral position. Procedure Date: 1983. 6. Bjork-Shiley mechanical mitral valve prosthesis functioning properly. 7. The tricuspid valve is normal in structure. 8. The aortic valve is normal in structure. 9. The pulmonic valve was normal in structure. Pulmonic valve regurgitation is mild by color flow Doppler. 10. The inferior vena cava was normal in size with <50% respiratory variability.  FINDINGS Left Ventricle: The left ventricle has a visually estimated ejection fraction of of 15-20%. The cavity size was severely dilated. There is no increase in left ventricular wall thickness. Left ventricular diastolic Doppler parameters are indeterminate  due to mitral valve replacement/repair. Left ventricular diffuse hypokinesis. Right Ventricle: The right ventricle has moderately reduced systolic function. The cavity was normal. There is no increase in right ventricular wall thickness. Pacing wire/catheter visualized in the right ventricle. Left Atrium: left atrial size was severely dilated Right Atrium: right atrial size was severely dilated. Right atrial pressure is estimated at 8 mmHg. Interatrial Septum: No atrial level shunt detected by color flow Doppler. Pericardium: There is no evidence of pericardial effusion. Mitral Valve: The mitral valve has been repaired/replaced. Mitral valve regurgitation is mild by color flow Doppler. A mechanical valve is present in the mitral position. Procedure Date: 34. Bjork-Shiley mechanical mitral valve prosthesis functioning  properly. Tricuspid Valve: The tricuspid valve is normal in structure. Tricuspid valve regurgitation is mild by color flow Doppler. Aortic Valve: The aortic valve is normal in structure. Aortic valve regurgitation was not visualized by color flow Doppler. Pulmonic Valve: The pulmonic valve was normal in structure. Pulmonic valve regurgitation is mild by color flow Doppler. Venous: The  inferior vena cava is normal in size with less than 50% respiratory variability.  Laboratory Data:  Chemistry Recent Labs  Lab 01/14/19 1548 01/15/19 0344  NA 138 137  K 4.6 4.3  CL 106 103  CO2 22 23  GLUCOSE 121* 133*  BUN 16 14  CREATININE 0.85 0.88  CALCIUM 8.7* 8.9  GFRNONAA >60 >60  GFRAA >60 >60  ANIONGAP 10 11    Recent Labs  Lab 01/14/19 1548 01/15/19 0344  PROT 6.1* 6.4*  ALBUMIN 2.9* 2.7*  AST 30 23  ALT 15 16  ALKPHOS 53 48  BILITOT 0.9 1.2   Hematology Recent Labs  Lab 01/14/19 1548 01/15/19 0344  WBC 12.2* 13.9*  RBC 4.02 3.80*  HGB 12.0 11.6*  HCT 39.3 35.3*  MCV 97.8 92.9  MCH 29.9 30.5  MCHC 30.5 32.9  RDW 18.9* 18.9*  PLT 185 164   Cardiac EnzymesNo results for input(s): TROPONINI in the last 168 hours. No results for input(s): TROPIPOC in the last 168 hours.  BNPNo results for input(s): BNP, PROBNP in the last 168 hours.  DDimer No results for input(s): DDIMER in the last 168 hours.  Radiology/Studies:   Dg Chest Portable 1 View Result Date: 01/14/2019 CLINICAL DATA:  Nausea and vomiting.  Fever x1 day. EXAM: PORTABLE CHEST 1 VIEW COMPARISON:  Chest x-ray dated 11/26/2018 FINDINGS: Bilateral pacemakers are again noted. The heart size is enlarged. Patient is status post prior median sternotomy and valve replacement. There is no pneumothorax.  There is mild generalized volume overload. No large area of consolidation. IMPRESSION: Stable appearance of the chest. Electronically Signed   By: Constance Holster M.D.   On: 01/14/2019 15:27   Dg Abd Portable 1 View  Result Date: 01/14/2019 CLINICAL DATA:  Vomiting and fever EXAM: PORTABLE ABDOMEN - 1 VIEW COMPARISON:  11/30/2018 FINDINGS: The bowel gas pattern is normal. No radio-opaque calculi or other significant radiographic abnormality are seen. There is a mild-to-moderate amount of stool in the colon. Multiple phleboliths project over the patient's pelvis. IMPRESSION: Nonobstructive bowel gas  pattern. Electronically Signed   By: Constance Holster M.D.   On: 01/14/2019 15:28    Assessment and Plan:   1. Recurrent VF storm with appropriate and successful shocks     Again in the environment of an infection     Seems she was off her amio and coreg of late  Agree with amio gtt (will stop the PO) If no recurrent VT/VR would consider transition to PO tomorrow Agree as well with resuming her coreg  Stable device function       For questions or updates, please contact [facility] Please consult [email or web address] for contact info under     Signed, Baldwin Jamaica, PA-C  01/15/2019 11:30 AM  I have seen and examined this patient with Tommye Standard.  Agree with above, note added to reflect my findings.  On exam, RRR, no murmurs, lungs clear. Patient presented to the hospital with altered mental status. While in the ER, had ICD shocks and started on amio ggt. At this point, would continue infections workup. If she has no further arrhythmias, would stop IV amiodarone tomorrow and start 400 mg PO BID for 2 weeks.   Will M. Camnitz MD 01/15/2019 2:38 PM

## 2019-01-15 NOTE — Significant Event (Signed)
Full progress note will follow  Brief discussion with Rockey Situ [spouse]--up till yesterday am-advanced home care RN came out and checked her---she looked like she was having "chills"  to husband---She vomited acc to husband after nurse l;eft Later on she had a diarrheal stool--and passed out She completed antibiotics  Spoke with husband about goals--I re-capitulated issues re: dementia and significant Cardiac issues  He is willing to re-engage with Palliative care regarding Goals discussion   Verneita Griffes, MD [facility] 9:02 AM

## 2019-01-15 NOTE — Progress Notes (Signed)
[place] for heparin and warfarin Indication: atrial fibrillation and mech MVR   Patient Measurements: Height: 5\' 6"  (167.6 cm) Weight: 140 lb 6.9 oz (63.7 kg) IBW/kg (Calculated) : 59.3   Vital Signs: Temp: 98.5 F (36.9 C) (05/22 1650) Temp Source: Oral (05/22 1650) BP: 92/66 (05/22 1650) Pulse Rate: 73 (05/22 1650)  Labs: Recent Labs    01/14/19 1548 01/14/19 1549 01/15/19 0344 01/15/19 1806  HGB 12.0  --  11.6*  --   HCT 39.3  --  35.3*  --   PLT 185  --  164  --   LABPROT  --  16.0* 16.5*  --   INR  --  1.3* 1.4*  --   HEPARINUNFRC  --   --   --  0.10*  CREATININE 0.85  --  0.88  --     Assessment: 73 y/o female admitted with AMS and N/V. Appears that patient is followed by palliative care. She takes warfarin PTA for Afib, mech MVR, hx DVT, hx stroke. Pharmacy consulted to bridge with IV heparin to warfarin.  INR subtherapeutic at 1.4 today. No bleeding noted, Hgb stable at 11.6, platelets are normal.  PTA warfarin regimen: 5 mg daily (uses 1 mg tabs) per husband  PCP follows INRs - he was instructed to hold warfarin 5/15-5/17 then resume 5 mg daily on 5/18  Initial heparin level low at 0.1, no bleeding or IV issues noted.   Goal of Therapy:  Heparin level 0.3-0.7 units/ml INR 2.5-3.5 Monitor platelets by anticoagulation protocol: Yes   Plan:  -Increase heparin infusion rate to 1000 units/hr -8 hr heparin level -Daily heparin level, CBC, INR -Monitor for s/sx of bleeding  Erin Hearing PharmD., BCPS Clinical Pharmacist 01/15/2019 6:44 PM   **Pharmacist phone directory can now be found on [email or web address] listed under [place]**

## 2019-01-15 NOTE — Progress Notes (Signed)

## 2019-01-15 NOTE — Progress Notes (Signed)
[place] for heparin and warfarin Indication: atrial fibrillation and mech MVR   Patient Measurements: Height: 5\' 6"  (167.6 cm) Weight: 140 lb 6.9 oz (63.7 kg) IBW/kg (Calculated) : 59.3   Vital Signs: Temp: 97.9 F (36.6 C) (05/22 0533) Temp Source: Oral (05/22 0533) BP: 94/58 (05/22 0750) Pulse Rate: 73 (05/22 0750)  Labs: Recent Labs    01/14/19 1548 01/14/19 1549 01/15/19 0344  HGB 12.0  --  11.6*  HCT 39.3  --  35.3*  PLT 185  --  164  LABPROT  --  16.0* 16.5*  INR  --  1.3* 1.4*  CREATININE 0.85  --  0.88    Assessment: 74 y/o female admitted with AMS and N/V. Appears that patient is followed by palliative care. She takes warfarin PTA for Afib, mech MVR, hx DVT, hx stroke. Pharmacy consulted to bridge with IV heparin to warfarin.  INR subtherapeutic at 1.4 today. No bleeding noted, Hgb stable at 11.6, platelets are normal.  PTA warfarin regimen: 5 mg daily (uses 1 mg tabs) per husband  PCP follows INRs - he was instructed to hold warfarin 5/15-5/17 then resume 5 mg daily on 5/18  Goal of Therapy:  Heparin level 0.3-0.7 units/ml INR 2.5-3.5 Monitor platelets by anticoagulation protocol: Yes   Plan:  -Begin heparin drip with 1600 unit IV bolus then infuse at 800 units/hr -Warfarin 4 mg PO tonight -8 hr heparin level -Daily heparin level, CBC, INR -Monitor for s/sx of bleeding   Renold Genta, PharmD, BCPS Clinical Pharmacist Clinical phone for 01/15/2019 until 3:30p is [phone] 01/15/2019 9:27 AM  **Pharmacist phone directory can now be found on [email or web address] listed under [place]**

## 2019-01-15 NOTE — Consult Note (Signed)
Consultation Note Date: 01/15/2019   Patient Name: Angela [name]  DOB: 09/25/1945  MRN: [id number]  Age / Sex: 73 y.o., female  PCP: Lorene Dy, MD Referring Physician: Nita Sells, MD  Reason for Consultation: Establishing goals of care and Psychosocial/spiritual support  HPI/Patient Profile: 73 y.o. female  with past medical history of rheumatic heart disease, MI 2009, embolectomy 2003, mitral valve replacement 1983, history of V. fib arrest, ICD, history of colon cancer, dementia, history of DVT, A. fib, history of CVA, cardiomyopathy, ejection fraction 15 to 20% Fournier's gangrene to perianal area admitted on 01/14/2019 with increased weakness, increased confusion, nausea and vomiting.  Patient was found to have a urinary tract infection.  In the emergency room she was noted to have multiple episodes of nonsustained V. Tach.  Consult ordered for goals of care.  Patient has seen palliative medicine services going back to 2018 and as recently as April 2020 when she was admitted for Fournier's gangrene to perianal area..   Clinical Assessment and Goals of Care: Patient seen, chart reviewed.  As noted above, patient has seen palliative medicine providers in 2018 as well as most recently in April 2020 when she was admitted for perianal abscess, Fournier's gangrene.  She was discharged home with home health.  When I asked patient what brought her to the hospital she replied, "my husband said I passed out".  She herself has no memory of that event or what brought her to the hospital.  She is oriented to herself.  She exhibits short-term as well as some long-term deficits.  She is able to tell me her husband and son's name but is unable to tell me how long they have been married or her sons age.  Her chief concern at this point is being able to get back home with her family.  She tells me that her perianal  abscess causes her continuous pain.  I attempted to elicit the severity of her pain on a 1-10 pain scale but she was unable to  describe her pain in terms of severity on a scale 1-10.[name]  She also is not able to tell me what makes it better or worse just that it is present all the time and she uses the word "severe".  She is unable to tell me what she takes for pain.  She tells me her husband handles all of her medication and communication with physicians regarding her health condition  Mrs. Delconte is unable to speak for herself in terms of complex medical decision making secondary to dementia and as noted above deficits.  Her healthcare proxy is her husband, Angela [name].  His number is [phone].  I have attempted to call Mr. Sand twice today and have left a message  Per reviewing my coworkers note from April 2020, her conversation with Mr. Shoaff was that he wishes his wife to remain a full code but if she were to "worsen" he was not opposed to considering a DNR.  It is not clear from prior palliative  medicine notes what would be an endpoint for Mrs. Durr in the setting of her severe cardiac disease.  She herself seems to express interest in less pain and returning home    SUMMARY OF RECOMMENDATIONS   Continue full scope of treatment for now Palliative medicine will continue to try to reach patient's surrogate decision maker, her husband, to address goals of care.  Per chart review, husband has stipulated in March 2020 patient would not want a PEG tube Code Status/Advance Care Planning:  Full code    Symptom Management:   Pain: Pt is on oxycodone IR 5 to 10 mg every 6 hours as needed for pain.  Given patient's dementia, would recommend scheduled dosing of 5 mg every 6 hours with continued PRN orders for breakthrough pain  Palliative Prophylaxis:   Aspiration, Bowel Regimen, Delirium Protocol, Eye Care, Frequent Pain Assessment, Oral Care and Turn Reposition  Additional Recommendations  (Limitations, Scope, Preferences):  Full Scope Treatment.  Per previous palliative medicine notes, specifically March 2020 family stipulated no PEG otherwise full scope of treatment  Psycho-social/Spiritual:   Desire for further Chaplaincy support:no  Additional Recommendations: Referral to Community Resources   Prognosis:   Patient would qualify for her hospice Medicare benefit given the severity of her cardiac illness, EF 15 to 20% mitral valve replacement history of V. fib arrest, atrial fib as well as most recently new diagnosis of Fournier's gangrene.  When I am able to reach her surrogate decision maker, her husband, I will share this opinion with him.  Hospice would be a good resource in the home if this is in keeping with their goals  Disposition Likely home with home health but she would qualify for her in-home hospice benefit.  She would not be a residential hospice candidate at this point. Ongoing palliative care discussions in the community would also be helpful.  Those resources can be reached through either [place] at [phone] or [facility] palliative medicine division at [phone]      Primary Diagnoses: Present on Admission: . Acute metabolic encephalopathy . Atrial fibrillation ([place]) . Systolic CHF, chronic ([place]) . Dementia ([place]) . Fournier's gangrene in female . ICD (implantable cardioverter-defibrillator), biventricular, in situ . UTI (urinary tract infection)   I have reviewed the medical record, interviewed the patient and family, and examined the patient. The following aspects are pertinent.  Past Medical History:  Diagnosis Date  . Abnormality of gait 08/23/2014  . Arthritis   . Atrial fibrillation ([place])    A.  Chronic Coumadin  . Cardiac arrest - ventricular fibrillation    A.  12/2007;  B. 01/2008 St. Jude Promote Bi-V ICD placed  . CVA (cerebral vascular accident) ([place])   . Diverticular disease   . DVT  (deep venous thrombosis) ([place])   . 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 History of colon cancer    A.  1999 - T3, N1  chemotherapy  . Internal hemorrhoids   . Memory deficit 08/23/2014  . Nonischemic cardiomyopathy ([place])    a.  11/2001 - Cath - NL Cors; b. ECHO 09/22/11: EF 20%, MVR normal, moderate LAE, mild RAE c. ECHO (08/2012): ED 20%, diff HK, LA mod dilated, mild/mod TR, RV mild/mod decreased sys fx  . Rectal bleeding 04/28/2017  . Rheumatic heart disease    A. 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 Sick sinus syndrome ([place])  A.  s/p pacer in 1995.  B.    . Systolic CHF, chronic ([place])    A.  01/2008 Echo - EF 10-20%   Social History   Socioeconomic History  . Marital status: Married    Spouse name: Not on file  . Number of children: 1  . Years of education: Not on file  . Highest education level: Not on file  Occupational History  . Occupation: Retired    Fish farm manager: RETIRED  Social Needs  . Financial resource strain: Not on file  . Food insecurity:    Worry: Not on file    Inability: Not on file  . Transportation needs:    Medical: Not on file    Non-medical: Not on file  Tobacco Use  . Smoking status: Never Smoker  . Smokeless tobacco: Never Used  Substance and Sexual Activity  . Alcohol use: No  . Drug use: No  . Sexual activity: Never  Lifestyle  . Physical activity:    Days per week: Not on file    Minutes per session: Not on file  . Stress: Not on file  Relationships  . Social connections:    Talks on phone: Not on file    Gets together: Not on file    Attends religious service: Not on file    Active member of club or organization: Not on file    Attends meetings of clubs or organizations: Not on file    Relationship status: Not on file  Other Topics Concern  . Not on file  Social History Narrative   The patient is married and has 1 child.  Lives in [place] with husband.  She is retired from  working at [employer]  She does not smoke or drink   Family History  Problem Relation Age of Onset  . Diabetes Mother 59  . Coronary artery disease Mother        s/p cabg  . Lung cancer Father 11       smoker  . Coronary artery disease Sister        s/p cabg   Scheduled Meds: . amiodarone  400 mg Oral Daily  . carvedilol  3.125 mg Oral BID WC  . cholecalciferol  2,000 Units Oral Daily  . sodium chloride flush  3 mL Intravenous Once  . sodium chloride flush  3 mL Intravenous Q12H  . warfarin  4 mg Oral ONCE-1800  . Warfarin - Pharmacist Dosing Inpatient   Does not apply q1800   Continuous Infusions: . sodium chloride    . sodium chloride 50 mL/hr at 01/15/19 0934  . amiodarone (CORDARONE) infusion 30 mg/hr (01/15/19 0221)  . cefTRIAXone (ROCEPHIN)  IV 2 g (01/15/19 0649)  . heparin 800 Units/hr (01/15/19 1123)   PRN Meds:.sodium chloride, ondansetron (ZOFRAN) IV, ondansetron **OR** ondansetron (ZOFRAN) IV, oxyCODONE, sodium chloride flush Medications Prior to Admission:  Prior to Admission medications   Medication Sig Start Date End Date Taking? Authorizing Provider  alendronate (FOSAMAX) 70 MG tablet Take 70 mg by mouth once a week. Take with a full glass of water on an empty stomach.    [provider]  amiodarone (PACERONE) 200 MG tablet Take 2 tablets (400 mg total) by mouth daily. 12/08/18   Domenic Polite, MD  carvedilol (COREG) 3.125 MG tablet Take 1 tablet (3.125 mg total) by mouth 2 (two) times daily with a meal. 03/18/18   Evans Lance, MD  cholecalciferol (VITAMIN D) 1000 UNITS tablet Take  2,000 Units by mouth daily.    [provider]  furosemide (LASIX) 40 MG tablet Take 0.5 tablets (20 mg total) by mouth daily as needed for fluid. 12/08/18   Domenic Polite, MD  oxyCODONE (OXY IR/ROXICODONE) 5 MG immediate release tablet Take 1-2 tablets (5-10 mg total) by mouth every 6 (six) hours as needed for moderate pain. 12/08/18   Domenic Polite,  MD  warfarin (COUMADIN) 4 MG tablet Take 2 tablets (8 mg total) by mouth daily at 6 PM. Patient taking differently: Take 6 mg by mouth daily at 6 PM.  05/08/17   Murlean Iba, MD   No Known Allergies Review of Systems  Unable to perform ROS: Dementia    Physical Exam Vitals signs and nursing note reviewed.  HENT:     Head: Normocephalic and atraumatic.  Neck:     Musculoskeletal: Normal range of motion.  Cardiovascular:     Rate and Rhythm: Normal rate.  Pulmonary:     Effort: Pulmonary effort is normal.  Skin:    General: Skin is warm and dry.  Neurological:     Mental Status: She is alert.     Comments: Oriented to self  Psychiatric:     Comments: Patient is anxious, concentration poor Oriented to herself, recognizes she is in the hospital but does not know month date or year     Vital Signs: BP (!) 94/58 (BP Location: Right Arm)   Pulse 73   Temp 97.9 F (36.6 C) (Oral)   Resp (!) 23   Ht 5\' 6"  (1.676 m)   Wt 63.7 kg   SpO2 93%   BMI 22.67 kg/m  Pain Scale: 0-10   Pain Score: 0-No pain   SpO2: SpO2: 93 % O2 Device:SpO2: 93 % O2 Flow Rate: .O2 Flow Rate (L/min): 2 L/min  IO: Intake/output summary:   Intake/Output Summary (Last 24 hours) at 01/15/2019 1433 Last data filed at 01/15/2019 0600 Gross per 24 hour  Intake 103 ml  Output 200 ml  Net -97 ml    LBM: Last BM Date: (UTA) Baseline Weight: Weight: 54.4 kg Most recent weight: Weight: 63.7 kg     Palliative Assessment/Data:   Flowsheet Rows     Most Recent Value  Intake Tab  Referral Department  Hospitalist  Unit at Time of Referral  Med/Surg Unit  Palliative Care Primary Diagnosis  Sepsis/Infectious Disease  Date Notified  01/15/19  Palliative Care Type  Return patient Palliative Care  Reason for referral  Clarify Goals of Care, Psychosocial or Spiritual support  Date of Admission  01/14/19  Date first seen by Palliative Care  01/15/19  # of days Palliative referral response time  0  Day(s)  # of days IP prior to Palliative referral  1  Clinical Assessment  Palliative Performance Scale Score  40%  Pain Max last 24 hours  Not able to report  Pain Min Last 24 hours  Not able to report  Dyspnea Max Last 24 Hours  Not able to report  Dyspnea Min Last 24 hours  Not able to report  Nausea Max Last 24 Hours  Not able to report  Nausea Min Last 24 Hours  Not able to report  Anxiety Max Last 24 Hours  Not able to report  Anxiety Min Last 24 Hours  Not able to report  Other Max Last 24 Hours  Not able to report  Psychosocial & Spiritual Assessment  Palliative Care Outcomes  Patient/Family meeting held?  Yes  Who was at the meeting?  pt,  attempted to call husband but no answer. LM  Palliative Care follow-up planned  Yes, Facility      Time In: 1400 Time Out: 1450 Time Total: 50 min Greater than 50%  of this time was spent counseling and coordinating care related to the above assessment and plan.  Signed by: Dory Horn, NP   Please contact Palliative Medicine Team phone at [phone] for questions and concerns.  For individual provider: See Shea Evans

## 2019-01-16 DIAGNOSIS — Z95810 Presence of automatic (implantable) cardiac defibrillator: Secondary | ICD-10-CM

## 2019-01-16 DIAGNOSIS — Z952 Presence of prosthetic heart valve: Secondary | ICD-10-CM

## 2019-01-16 DIAGNOSIS — I472 Ventricular tachycardia: Secondary | ICD-10-CM

## 2019-01-16 DIAGNOSIS — I428 Other cardiomyopathies: Secondary | ICD-10-CM

## 2019-01-16 DIAGNOSIS — Z7189 Other specified counseling: Secondary | ICD-10-CM

## 2019-01-16 DIAGNOSIS — I482 Chronic atrial fibrillation, unspecified: Secondary | ICD-10-CM

## 2019-01-16 DIAGNOSIS — F0390 Unspecified dementia without behavioral disturbance: Secondary | ICD-10-CM

## 2019-01-16 LAB — COMPREHENSIVE METABOLIC PANEL
ALT: 35 U/L (ref 0–44)
AST: 43 U/L — ABNORMAL HIGH (ref 15–41)
Albumin: 2.5 g/dL — ABNORMAL LOW (ref 3.5–5.0)
Alkaline Phosphatase: 50 U/L (ref 38–126)
Anion gap: 10 (ref 5–15)
BUN: 15 mg/dL (ref 8–23)
CO2: 19 mmol/L — ABNORMAL LOW (ref 22–32)
Calcium: 8.4 mg/dL — ABNORMAL LOW (ref 8.9–10.3)
Chloride: 104 mmol/L (ref 98–111)
Creatinine, Ser: 0.89 mg/dL (ref 0.44–1.00)
GFR calc Af Amer: >60 mL/min (ref >60)
GFR calc non Af Amer: >60 mL/min (ref >60)
Glucose, Bld: 120 mg/dL — ABNORMAL HIGH (ref 70–99)
Potassium: 3.5 mmol/L (ref 3.5–5.1)
Sodium: 133 mmol/L — ABNORMAL LOW (ref 135–145)
Total Bilirubin: 0.7 mg/dL (ref 0.3–1.2)
Total Protein: 6.1 g/dL — ABNORMAL LOW (ref 6.5–8.1)

## 2019-01-16 LAB — CBC WITH DIFFERENTIAL/PLATELET
Abs Immature Granulocytes: 0.05 10*3/uL (ref 0.00–0.07)
Basophils Absolute: 0 10*3/uL (ref 0.0–0.1)
Basophils Relative: 0 %
Eosinophils Absolute: 0.1 10*3/uL (ref 0.0–0.5)
Eosinophils Relative: 1 %
HCT: 33.9 % — ABNORMAL LOW (ref 36.0–46.0)
Hemoglobin: 10.9 g/dL — ABNORMAL LOW (ref 12.0–15.0)
Immature Granulocytes: 1 %
Lymphocytes Relative: 14 %
Lymphs Abs: 1.2 10*3/uL (ref 0.7–4.0)
MCH: 30.2 pg (ref 26.0–34.0)
MCHC: 32.2 g/dL (ref 30.0–36.0)
MCV: 93.9 fL (ref 80.0–100.0)
Monocytes Absolute: 0.9 10*3/uL (ref 0.1–1.0)
Monocytes Relative: 10 %
Neutro Abs: 6.3 10*3/uL (ref 1.7–7.7)
Neutrophils Relative %: 74 %
Platelets: 173 10*3/uL (ref 150–400)
RBC: 3.61 MIL/uL — ABNORMAL LOW (ref 3.87–5.11)
RDW: 18.7 % — ABNORMAL HIGH (ref 11.5–15.5)
WBC: 8.5 10*3/uL (ref 4.0–10.5)
nRBC: 0 % (ref 0.0–0.2)

## 2019-01-16 LAB — PROTIME-INR
INR: 1.6 — ABNORMAL HIGH (ref 0.8–1.2)
Prothrombin Time: 19.2 seconds — ABNORMAL HIGH (ref 11.4–15.2)

## 2019-01-16 LAB — MAGNESIUM: Magnesium: 1.8 mg/dL (ref 1.7–2.4)

## 2019-01-16 LAB — HEPARIN LEVEL (UNFRACTIONATED): Heparin Unfractionated: 0.31 IU/mL (ref 0.30–0.70)

## 2019-01-16 MED ORDER — AMIODARONE HCL 200 MG PO TABS
400.0000 mg | ORAL_TABLET | Freq: Two times a day (BID) | ORAL | Status: DC
  Administered 2019-01-16 – 2019-01-19 (×7): 400 mg via ORAL
  Filled 2019-01-16 (×7): qty 2

## 2019-01-16 MED ORDER — WARFARIN SODIUM 4 MG PO TABS
4.0000 mg | ORAL_TABLET | Freq: Once | ORAL | Status: AC
  Administered 2019-01-16: 18:00:00 4 mg via ORAL
  Filled 2019-01-16: qty 1

## 2019-01-16 NOTE — Progress Notes (Addendum)
Palliative medicine progress note  Patient seen, chart reviewed.  I was able to reach patient's husband via telephone this morning.  Introduced Teacher, adult education of goals of care, specifically CODE STATUS, artificial feeding as well as ICD.  As noted in prior palliative medicine consult notes, we have spoken to Angela [name] extensively in the past regarding CODE STATUS as well as contrasting and defining aggressive medical treatment versus symptom based approach in the setting of dementia, severe underlying heart disease.  I defined full code and DNR.  Angela [name] states he would not want to see her on life support and seems to recognize that this aggressive procedure would not "make her better".  I reiterated that DNR does not mean do not treat but would set a limit of care and protect her from medical interventions, given her comorbidities, that would not improve her life.  I shared with Mr. Connelley my impression of his wife, that her chief concerns are getting back home to her husband and son.  He agreed, and verbalized that being in ICU, potentially ventilated after a code, would not be something he would want to see her go through.  We also discussed her clinical condition, current sepsis as well as her underlying cardiac history.  I stressed to him that we were  treating the treatable, that she is on antibiotics and that we are hopeful that this can improve.  But I also stated that she is at high risk for this to happen again.  Even though she has been living with heart disease all her life he also verbalizes that he has seen a sharp decline this year both in terms of her dementia and says he understands the severity of her heart disease.  I did review again that her heart failure was severe with an ejection fraction of 15 to 20%, as well as defining what that meant.  Patient Profile: 73 y.o. female  with past medical history of rheumatic heart disease, MI 2009, embolectomy 2003, mitral valve replacement 1983,  history of V. fib arrest, ICD, history of colon cancer, dementia, history of DVT, A. fib, history of CVA, cardiomyopathy, ejection fraction 15 to 20% Fournier's gangrene to perianal area admitted on 01/14/2019 with increased weakness, increased confusion, nausea and vomiting.  Patient was found to have a urinary tract infection.  In the emergency room she was noted to have multiple episodes of nonsustained V. Tach.She was admitted with sepsis, positive blood cultures for e. Coli  Consult ordered for goals of care.  Patient has seen palliative medicine services going back to 2018 and as recently as April 2020 when she was admitted for Fournier's gangrene to perianal area..   Plan  Husband states he wants to discuss with his son change of CODE STATUS as well as deactivation of ICD.  In the past he has said no feeding tube and he still is able to state no feeding tube at this time.  Regarding patient's reports of severe pain, patient's husband states he does not have pain medicine at home and is only been giving her Tylenol.  I will discuss with the nursing staff the role of oxycodone for severe pain in the context of probably patient's inability to ask for as needed medications  I offered to email him a copy of [facility] for [place].  He states he does not have an email account but his son does and he will call our service back with that address and I will send that  paperwork as a means of reinforcement for  family  Continue full scope of treatment for now.  If Angela [name] were to decline during this hospitalization, I believe with further discussion, he would change her to a DNR and not want to see her on life support  Palliative medicine to continue to follow Thank you, Romona Curls, nurse practitioner Total time: 25 minutes Greater than 50% of time was spent in counseling and coordination of care

## 2019-01-16 NOTE — Progress Notes (Addendum)
Okay to stop Amio gtt. Amio PO started. Verified with Dr Rayann Heman.

## 2019-01-16 NOTE — Progress Notes (Signed)
[place] for heparin and warfarin Indication: atrial fibrillation and mech MVR   Patient Measurements: Height: 5\' 6"  (167.6 cm) Weight: 140 lb 6.9 oz (63.7 kg) IBW/kg (Calculated) : 59.3   Vital Signs: Temp: 98.3 F (36.8 C) (05/22 2032) Temp Source: Oral (05/22 2032) BP: 96/65 (05/22 2350) Pulse Rate: 70 (05/22 2350)  Labs: Recent Labs    01/14/19 1548 01/14/19 1549 01/15/19 0344 01/15/19 1806 01/16/19 0241  HGB 12.0  --  11.6*  --  10.9*  HCT 39.3  --  35.3*  --  33.9*  PLT 185  --  164  --  173  LABPROT  --  16.0* 16.5*  --  19.2*  INR  --  1.3* 1.4*  --  1.6*  HEPARINUNFRC  --   --   --  0.10* 0.31  CREATININE 0.85  --  0.88  --   --     Assessment: 73 y/o female admitted with AMS and N/V. Appears that patient is followed by palliative care. She takes warfarin PTA for Afib, mech MVR, hx DVT, hx stroke. Pharmacy consulted to bridge with IV heparin to warfarin.  INR subtherapeutic at 1.4 today. No bleeding noted, Hgb stable at 11.6, platelets are normal.  PTA warfarin regimen: 5 mg daily (uses 1 mg tabs) per husband  PCP follows INRs - he was instructed to hold warfarin 5/15-5/17 then resume 5 mg daily on 5/18  Heparin level lower end therapeutic at 0.31  Goal of Therapy:  Heparin level 0.3-0.7 units/ml INR 2.5-3.5 Monitor platelets by anticoagulation protocol: Yes   Plan:  Increase heparin gtt to 1050 units/hr Daily heparin level, CBC, s/s bleeding  Bertis Ruddy, PharmD Clinical Pharmacist Please check AMION for all [place] numbers 01/16/2019 3:53 AM

## 2019-01-16 NOTE — Progress Notes (Signed)
Progress Note   Subjective   Doing well today, the patient denies CP or SOB.  No new concerns  Inpatient Medications    Scheduled Meds: . amiodarone  400 mg Oral Daily  . carvedilol  3.125 mg Oral BID WC  . sodium chloride flush  3 mL Intravenous Once  . sodium chloride flush  3 mL Intravenous Q12H  . warfarin  4 mg Oral ONCE-1800  . Warfarin - Pharmacist Dosing Inpatient   Does not apply q1800   Continuous Infusions: . sodium chloride    . sodium chloride 50 mL/hr at 01/16/19 0438  . amiodarone (CORDARONE) infusion 30 mg/hr (01/15/19 0221)  . cefTRIAXone (ROCEPHIN)  IV 2 g (01/16/19 1001)  . heparin 1,050 Units/hr (01/16/19 1006)   PRN Meds: sodium chloride, ondansetron (ZOFRAN) IV, ondansetron **OR** ondansetron (ZOFRAN) IV, oxyCODONE, sodium chloride flush   Vital Signs    Vitals:   01/16/19 0800 01/16/19 0931 01/16/19 1100 01/16/19 1141  BP:  121/82 96/69   Pulse: 76 72 73   Resp: (!) 27 (!) 29 17   Temp:    98.1 F (36.7 C)  TempSrc:    Oral  SpO2: 96% 95% 97%   Weight:      Height:        Intake/Output Summary (Last 24 hours) at 01/16/2019 1241 Last data filed at 01/15/2019 1700 Gross per 24 hour  Intake 1031.24 ml  Output 500 ml  Net 531.24 ml   Filed Weights   01/15/19 0004 01/15/19 0558 01/16/19 0428  Weight: 63.7 kg 63.7 kg 64.4 kg    Telemetry    V paced - Personally Reviewed  Physical Exam   GEN- The patient is ill appearing, alert  Head- normocephalic, atraumatic Eyes-  Sclera clear, conjunctiva pink Ears- hearing intact Oropharynx- clear Neck- supple, Lungs-  normal work of breathing Heart- Regular rate and rhythm  (paced) GI- soft, NT, ND, + BS Extremities- no clubbing, cyanosis, or edema  MS- diffuse atrophy   Labs    Chemistry Recent Labs  Lab 01/14/19 1548 01/15/19 0344 01/16/19 0241  NA 138 137 133*  K 4.6 4.3 3.5  CL 106 103 104  CO2 22 23 19*  GLUCOSE 121* 133* 120*  BUN 16 14 15   CREATININE 0.85 0.88 0.89   CALCIUM 8.7* 8.9 8.4*  PROT 6.1* 6.4* 6.1*  ALBUMIN 2.9* 2.7* 2.5*  AST 30 23 43*  ALT 15 16 35  ALKPHOS 53 48 50  BILITOT 0.9 1.2 0.7  GFRNONAA >60 >60 >60  GFRAA >60 >60 >60  ANIONGAP 10 11 10      Hematology Recent Labs  Lab 01/14/19 1548 01/15/19 0344 01/16/19 0241  WBC 12.2* 13.9* 8.5  RBC 4.02 3.80* 3.61*  HGB 12.0 11.6* 10.9*  HCT 39.3 35.3* 33.9*  MCV 97.8 92.9 93.9  MCH 29.9 30.5 30.2  MCHC 30.5 32.9 32.2  RDW 18.9* 18.9* 18.7*  PLT 185 164 173    Cardiac EnzymesNo results for input(s): TROPONINI in the last 168 hours. No results for input(s): TROPIPOC in the last 168 hours.      Assessment & Plan    1.  Ventricular fibrillation storm Convert IV amiodarone to oral Continue medical management Palliative care notes are noted Decisions to turn off ICD are still being considered.  If this decision is made, please contact me and I can assist with device programming during business hours.  2. Chronic systolic dysfunction/ valvular heart disease Continue medical therapy On coumadin with  heparin bridge for mechanical MVR  3. Infection/ dementia Poor overall prognosis Palliative care note is reviewed  4. afib Rate controlled On coumadin  EP to see as needed over the weekend  Thompson Grayer MD, [facility] 01/16/2019 12:41 PM

## 2019-01-16 NOTE — Progress Notes (Signed)
[name] PRO[facility]  Angela [name] PYK:[id number] DOB: 08-Jul-1946 DOA: 01/14/2019 PCP: Lorene Dy, MD Narrative:  73 year old African-American female Significant cardiac comorbidities left bundle branch block chronic systolic heart failure VF arrest complete heart block status post AV nodal ablation Baseline ejection fraction 15% Mechanical mitral valve 1983 GI bleed 05/08/2017 Prior history of colon cancer Stage VII dementia  Recent hospitalization 4/2-4/14 2020 with fourniers gangrene--- patient was admitted with VF shocks from surgeon's office while she was being evaluated for her wound Cardiology saw her she was on a lidocaine and amiodarone drip at that time and was sent home on Coumadin At that admission palliative care saw the patient and they were not apparently receptive to palliative services She was readmitted 5/21 as per the history listed by me earlier today not feeling well more confused and passed out again and she had multiple episodes of nonsustained SVT/VT in the hospital On admission she met sepsis criteria lactic acid 1.8 white count 12 T-max 100.5 BP 97/64 respiratory rate 40   A & Plan Severe sepsis both bottles E. coli again--ceftriaxone 2 g daily sepsis physiology resolving slow Repeat labs in the morning Saline at 50 cc an hour Multiple cardiac conditions including A. fib sick sinus syndrome pacemaker placement nonischemic cardiomyopathy Defer to cardiology-continue amiodarone GTT, Coreg 3.125 twice daily INR  Low--needs to be ~2.5 prior to d/c  Recent treatment of fopurnier'sgangrene Wound care as needed Prior GI bleed and colon cancer history Monitor labs a.m. no overt signs of bleeding Profound dementia, stage VII  Appreciate effort by palliative care and their engagement Moderate malnutrition BMI is 22 Family has exceedingly poor insight Appreciate palliative medicine input and trying to delineate goals   DVT Heparin and Coumadin  code Status: Patient remains full code communication:no family today disposition Plan: Inpatient at this time   Verlon Au, MD  [facility] Via amion app OR [email or web address] 7PM-7AM contact night coverage as above 01/16/2019, 11:59 AM  LOS: 2 days   Consultants:  Cardiology  Procedures:  None at this time  Antimicrobials:  Ceftriaxone  Interval history/Subjective:  Pleasantly confused  Objective:  Vitals:  Vitals:   01/16/19 1100 01/16/19 1141  BP: 96/69   Pulse: 73   Resp: 17   Temp:  98.1 F (36.7 C)  SpO2: 97%     Exam:  Alert pleasant but confused EOMI NCAT arcus senilis No pallor no icterus Throat soft supple Abdomen nonobese nontender no organomegaly No lower extremity edema    I have personally reviewed the following:  DATA   Labs:   white count 12.2-->13.9--->8  INR 1.6   Review and summation of old records:  Summarized extensively  Scheduled Meds: . amiodarone  400 mg Oral Daily  . carvedilol  3.125 mg Oral BID WC  . sodium chloride flush  3 mL Intravenous Once  . sodium chloride flush  3 mL Intravenous Q12H  . warfarin  4 mg Oral ONCE-1800  . Warfarin - Pharmacist Dosing Inpatient   Does not apply q1800   Continuous Infusions: . sodium chloride    . sodium chloride 50 mL/hr at 01/16/19 0438  . amiodarone (CORDARONE) infusion 30 mg/hr (01/15/19 0221)  . cefTRIAXone (ROCEPHIN)  IV 2 g (01/16/19 1001)  . 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

## 2019-01-16 NOTE — Progress Notes (Signed)
ANTICOAGULATION CONSULT NOTE - Follow Up Consult  Pharmacy Consult for Hep/Warfarin Indication: PAF, mMVR, h/o DVT and CVA,  No Known Allergies  Patient Measurements: Height: 5\' 6"  (167.6 cm) Weight: 141 lb 15.6 oz (64.4 kg) IBW/kg (Calculated) : 59.3 Heparin Dosing Weight: 64.4 kg  Vital Signs: Temp: 98.1 F (36.7 C) (05/23 0428) Temp Source: Oral (05/23 0428) BP: 98/76 (05/23 0428) Pulse Rate: 76 (05/23 0800)  Labs: Recent Labs    01/14/19 1548 01/14/19 1549 01/15/19 0344 01/15/19 1806 01/16/19 0241  HGB 12.0  --  11.6*  --  10.9*  HCT 39.3  --  35.3*  --  33.9*  PLT 185  --  164  --  173  LABPROT  --  16.0* 16.5*  --  19.2*  INR  --  1.3* 1.4*  --  1.6*  HEPARINUNFRC  --   --   --  0.10* 0.31  CREATININE 0.85  --  0.88  --  0.89    Estimated Creatinine Clearance: 51.9 mL/min (by C-G formula based on SCr of 0.89 mg/dL).   Assessment:   Anticoag: Warfarin for PAF, mMVR, h/o DVT and CVA, HL 0.31 low end, INR 1.6, Hgb 11.6>10.9, pltc nml, took amio PTA - PTA warfarin regimen: 6 mg daily (uses 1 mg tabs) per husband  - PCP follows INRs - he was instructed to hold warfarin 5/15-5/17 then resume 5 mg daily on 5/18 - Last admit 11/2018 came in with INR 9.1, was also on fluconazole so was receiving 0.5-1 mg doses  Goal of Therapy:  Heparin level 0.3-0.7 units/ml  INR 2.5-3.5 Monitor platelets by anticoagulation protocol: Yes   Plan:  -IV heparin empirically increased to 1050 units/hr -Warfarin 4 mg PO x1 again tonight -daily INR, goal 2.5-3.5  Crystal S. Alford Highland, PharmD, BCPS Clinical Staff Pharmacist Eilene Ghazi Stillinger 01/16/2019,9:21 AM

## 2019-01-16 NOTE — Progress Notes (Signed)
Progress Note  Patient Name: Angela [name] Date of Encounter: 01/16/2019  Primary Cardiologist: Cristopher Peru, MD   Subjective   73 yo with AF, ICD , CHF admitted with ICD shocks.  Was started on amiodarone ,  No further VT   Inpatient Medications    Scheduled Meds: . amiodarone  400 mg Oral Daily  . carvedilol  3.125 mg Oral BID WC  . sodium chloride flush  3 mL Intravenous Once  . sodium chloride flush  3 mL Intravenous Q12H  . warfarin  4 mg Oral ONCE-1800  . Warfarin - Pharmacist Dosing Inpatient   Does not apply q1800   Continuous Infusions: . sodium chloride    . sodium chloride 50 mL/hr at 01/16/19 0438  . amiodarone (CORDARONE) infusion 30 mg/hr (01/15/19 0221)  . cefTRIAXone (ROCEPHIN)  IV 2 g (01/16/19 1001)  . heparin 1,050 Units/hr (01/16/19 1006)   PRN Meds: sodium chloride, ondansetron (ZOFRAN) IV, ondansetron **OR** ondansetron (ZOFRAN) IV, oxyCODONE, sodium chloride flush   Vital Signs    Vitals:   01/16/19 0800 01/16/19 0931 01/16/19 1100 01/16/19 1141  BP:  121/82 96/69   Pulse: 76 72 73   Resp: (!) 27 (!) 29 17   Temp:    98.1 F (36.7 C)  TempSrc:    Oral  SpO2: 96% 95% 97%   Weight:      Height:        Intake/Output Summary (Last 24 hours) at 01/16/2019 1249 Last data filed at 01/15/2019 1700 Gross per 24 hour  Intake 1031.24 ml  Output 500 ml  Net 531.24 ml   Last 3 Weights 01/16/2019 01/15/2019 01/15/2019  Weight (lbs) 141 lb 15.6 oz 140 lb 6.9 oz 140 lb 6.9 oz  Weight (kg) 64.4 kg 63.7 kg 63.7 kg      Telemetry    V pacing  - Personally Reviewed  ECG    Pacing  - Personally Reviewed  Physical Exam   GEN: elderly female, pleasantly demented.  Neck: No JVD Cardiac: RRR, mechanical S1  Respiratory: Clear to auscultation bilaterally. GI: Soft, nontender, non-distended  MS: No edema; No deformity. Neuro:  Nonfocal  Psych: Normal affect   Labs    Chemistry Recent Labs  Lab 01/14/19 1548 01/15/19 0344 01/16/19 0241  NA  138 137 133*  K 4.6 4.3 3.5  CL 106 103 104  CO2 22 23 19*  GLUCOSE 121* 133* 120*  BUN 16 14 15   CREATININE 0.85 0.88 0.89  CALCIUM 8.7* 8.9 8.4*  PROT 6.1* 6.4* 6.1*  ALBUMIN 2.9* 2.7* 2.5*  AST 30 23 43*  ALT 15 16 35  ALKPHOS 53 48 50  BILITOT 0.9 1.2 0.7  GFRNONAA >60 >60 >60  GFRAA >60 >60 >60  ANIONGAP 10 11 10      Hematology Recent Labs  Lab 01/14/19 1548 01/15/19 0344 01/16/19 0241  WBC 12.2* 13.9* 8.5  RBC 4.02 3.80* 3.61*  HGB 12.0 11.6* 10.9*  HCT 39.3 35.3* 33.9*  MCV 97.8 92.9 93.9  MCH 29.9 30.5 30.2  MCHC 30.5 32.9 32.2  RDW 18.9* 18.9* 18.7*  PLT 185 164 173    Cardiac EnzymesNo results for input(s): TROPONINI in the last 168 hours. No results for input(s): TROPIPOC in the last 168 hours.   BNPNo results for input(s): BNP, PROBNP in the last 168 hours.   DDimer No results for input(s): DDIMER in the last 168 hours.   Radiology    Dg Chest Portable 1 View  Result  Date: 01/14/2019 CLINICAL DATA:  Nausea and vomiting.  Fever x1 day. EXAM: PORTABLE CHEST 1 VIEW COMPARISON:  Chest x-ray dated 11/26/2018 FINDINGS: Bilateral pacemakers are again noted. The heart size is enlarged. Patient is status post prior median sternotomy and valve replacement. There is no pneumothorax. There is mild generalized volume overload. No large area of consolidation. IMPRESSION: Stable appearance of the chest. Electronically Signed   By: Constance Holster M.D.   On: 01/14/2019 15:27   Dg Abd Portable 1 View  Result Date: 01/14/2019 CLINICAL DATA:  Vomiting and fever EXAM: PORTABLE ABDOMEN - 1 VIEW COMPARISON:  11/30/2018 FINDINGS: The bowel gas pattern is normal. No radio-opaque calculi or other significant radiographic abnormality are seen. There is a mild-to-moderate amount of stool in the colon. Multiple phleboliths project over the patient's pelvis. IMPRESSION: Nonobstructive bowel gas pattern. Electronically Signed   By: Constance Holster M.D.   On: 01/14/2019 15:28     Cardiac Studies     Patient Profile     73 y.o. female  With VT and ICD shocks   Assessment & Plan    1/   recurrent ventricular fibrillation storm with 6 successful and appropriate shocks: This occurred in the setting of infection.  She is done well on amiodarone in the past.  She was started on amiodarone drip.  We will transition to oral amiodarone today.  Will start on amiodarone 400 mg twice a day for 2 weeks and then taper down from there. She will need follow up with Dr. Lovena Le or APP in 2-3 weeks.        For questions or updates, please contact [place] Please consult [email or web address] for contact info under        Signed, Mertie Moores, MD  01/16/2019, 12:49 PM

## 2019-01-16 NOTE — Discharge Instructions (Addendum)

## 2019-01-17 LAB — HEPARIN LEVEL (UNFRACTIONATED)
Heparin Unfractionated: 0.24 IU/mL — ABNORMAL LOW (ref 0.30–0.70)
Heparin Unfractionated: 0.26 [IU]/mL — ABNORMAL LOW (ref 0.30–0.70)

## 2019-01-17 LAB — RENAL FUNCTION PANEL
Albumin: 2.4 g/dL — ABNORMAL LOW (ref 3.5–5.0)
Anion gap: 12 (ref 5–15)
BUN: 17 mg/dL (ref 8–23)
CO2: 19 mmol/L — ABNORMAL LOW (ref 22–32)
Calcium: 8.4 mg/dL — ABNORMAL LOW (ref 8.9–10.3)
Chloride: 108 mmol/L (ref 98–111)
Creatinine, Ser: 0.89 mg/dL (ref 0.44–1.00)
GFR calc Af Amer: >60 mL/min
GFR calc non Af Amer: >60 mL/min
Glucose, Bld: 97 mg/dL (ref 70–99)
Phosphorus: 3.2 mg/dL (ref 2.5–4.6)
Potassium: 3.7 mmol/L (ref 3.5–5.1)
Sodium: 139 mmol/L (ref 135–145)

## 2019-01-17 LAB — PROTIME-INR
INR: 1.9 — ABNORMAL HIGH (ref 0.8–1.2)
Prothrombin Time: 21.4 s — ABNORMAL HIGH (ref 11.4–15.2)

## 2019-01-17 LAB — CBC WITH DIFFERENTIAL/PLATELET
Abs Immature Granulocytes: 0.05 K/uL (ref 0.00–0.07)
Basophils Absolute: 0 K/uL (ref 0.0–0.1)
Basophils Relative: 0 %
Eosinophils Absolute: 0 K/uL (ref 0.0–0.5)
Eosinophils Relative: 0 %
HCT: 33.9 % — ABNORMAL LOW (ref 36.0–46.0)
Hemoglobin: 10.8 g/dL — ABNORMAL LOW (ref 12.0–15.0)
Immature Granulocytes: 1 %
Lymphocytes Relative: 14 %
Lymphs Abs: 1.5 K/uL (ref 0.7–4.0)
MCH: 29.7 pg (ref 26.0–34.0)
MCHC: 31.9 g/dL (ref 30.0–36.0)
MCV: 93.1 fL (ref 80.0–100.0)
Monocytes Absolute: 0.8 K/uL (ref 0.1–1.0)
Monocytes Relative: 8 %
Neutro Abs: 8 K/uL — ABNORMAL HIGH (ref 1.7–7.7)
Neutrophils Relative %: 77 %
Platelets: 189 K/uL (ref 150–400)
RBC: 3.64 MIL/uL — ABNORMAL LOW (ref 3.87–5.11)
RDW: 18.3 % — ABNORMAL HIGH (ref 11.5–15.5)
WBC: 10.4 K/uL (ref 4.0–10.5)
nRBC: 0 % (ref 0.0–0.2)

## 2019-01-17 LAB — CULTURE, BLOOD (ROUTINE X 2): Special Requests: ADEQUATE

## 2019-01-17 MED ORDER — MAGNESIUM OXIDE 400 (241.3 MG) MG PO TABS
800.0000 mg | ORAL_TABLET | Freq: Two times a day (BID) | ORAL | Status: DC
  Administered 2019-01-17 – 2019-01-18 (×3): 800 mg via ORAL
  Filled 2019-01-17 (×6): qty 2

## 2019-01-17 MED ORDER — MAGNESIUM SULFATE IN D5W 1-5 GM/100ML-% IV SOLN
1.0000 g | Freq: Once | INTRAVENOUS | Status: DC
  Filled 2019-01-17: qty 100

## 2019-01-17 MED ORDER — WARFARIN SODIUM 4 MG PO TABS
4.0000 mg | ORAL_TABLET | Freq: Once | ORAL | Status: AC
  Administered 2019-01-17: 18:00:00 4 mg via ORAL
  Filled 2019-01-17: qty 1

## 2019-01-17 MED ORDER — DEXTROSE-NACL 5-0.9 % IV SOLN
INTRAVENOUS | Status: DC
  Administered 2019-01-17 – 2019-01-18 (×2): via INTRAVENOUS

## 2019-01-17 MED ORDER — ACETAMINOPHEN 325 MG PO TABS
650.0000 mg | ORAL_TABLET | Freq: Three times a day (TID) | ORAL | Status: DC
  Administered 2019-01-17 – 2019-01-19 (×7): 650 mg via ORAL
  Filled 2019-01-17 (×7): qty 2

## 2019-01-17 NOTE — Progress Notes (Signed)
ANTICOAGULATION CONSULT NOTE - Follow Up Consult  Pharmacy Consult for Hep/Warfarin Indication: PAF, mMVR, h/o DVT and CVA,  No Known Allergies  Patient Measurements: Height: 5\' 6"  (167.6 cm) Weight: 140 lb 6.9 oz (63.7 kg) IBW/kg (Calculated) : 59.3 Heparin Dosing Weight: 64.4 kg  Vital Signs: Temp: 98 F (36.7 C) (05/24 0525) Temp Source: Oral (05/24 0525) BP: 115/77 (05/24 0525) Pulse Rate: 73 (05/24 0525)  Labs: Recent Labs    01/14/19 1548  01/15/19 0344 01/15/19 1806 01/16/19 0241 01/17/19 0559  HGB 12.0  --  11.6*  --  10.9* 10.8*  HCT 39.3  --  35.3*  --  33.9* 33.9*  PLT 185  --  164  --  173 189  LABPROT  --    < > 16.5*  --  19.2* 21.4*  INR  --    < > 1.4*  --  1.6* 1.9*  HEPARINUNFRC  --   --   --  0.10* 0.31 0.24*  CREATININE 0.85  --  0.88  --  0.89  --    < > = values in this interval not displayed.    Estimated Creatinine Clearance: 51.9 mL/min (by C-G formula based on SCr of 0.89 mg/dL).   Assessment:  Anticoag: Warfarin for PAF, mMVR, h/o DVT and CVA, HL 0.24 low end, INR 1.9 up, Hgb 11.6>10.8, pltc nml, took amio PTA.    Goal of Therapy:  Heparin level 0.3-0.7 units/ml  INR 2.5-3.5 Monitor platelets by anticoagulation protocol: Yes   Plan:  -IV heparin increase to 1200 units/hr -Warfarin 4 mg PO x1 again -daily INR, goal 2.5-3.5  Crystal S. Alford Highland, PharmD, BCPS Clinical Staff Pharmacist Eilene Ghazi Stillinger 01/17/2019,7:20 AM

## 2019-01-17 NOTE — Progress Notes (Addendum)
[name] PRO[facility]ESS [facility]  Montine Hight POE:[id number] DOB: 1945-10-16 DOA: 01/14/2019 PCP: Lorene Dy, MD Narrative:  73 year old African-American female Significant cardiac comorbidities left bundle branch block chronic systolic heart failure VF arrest complete heart block status post AV nodal ablation Baseline ejection fraction 15% Mechanical mitral valve 1983 GI bleed 05/08/2017 Prior history of colon cancer Stage VII dementia  Recent hospitalization 4/2-4/14 2020 with fourniers gangrene--- patient was admitted with VF shocks from surgeon's office while she was being evaluated for her wound Cardiology saw her she was on a lidocaine and amiodarone drip at that time and was sent home on Coumadin At that admission palliative care saw the patient and they were not apparently receptive to palliative services She was readmitted 5/21- ,confused toxic metabolic encephalopathy  multiple episodes of nonsustained SVT/VT in the hospital On admission she met sepsis criteria lactic acid 1.8 white count 12 T-max 100.5 BP 97/64 respiratory rate 40   A & Plan Severe sepsis both bottles E. coli again--ceftriaxone 2 g daily until discharge and then transition based on culture sensitivities from 5/21 to cover 10 total days of antibiotic for E. coli bacteremia sepsis physiology is better Since labs are stable As patient is eating only 20 to 30% of her food I will change fluids to D5 saline 50 cc/h  cardiac conditions A. fib sick sinus syndrome pacemaker placement nonischemic cardiomyopathy Converted from IV GTT-->amiodarone 400 twice daily, Coreg 3.125 twice daily INR subtherapeutic-needs bridging ~2.5 or greater as is on Coumadin Recent treatment of fopurnier'sgangrene Wound care as needed-stable Prior GI bleed and colon cancer history Monitor labs a.m. no overt signs of bleeding Profound dementia, stage VII  Appreciate effort by palliative care and their engagement Moderate  malnutrition BMI is 22 Appreciate palliative medicine input and trying to delineate goals   DVT Heparin and Coumadin-->discontinue heparin GTT when INR is above 2.5 code Status: Patient remains full code communication:no family today disposition Plan: Inpatient until therapeutic on anticoagulation   Samtani, MD  [facility] Via [employer] app OR [email or web address] 7PM-7AM contact night coverage as above 01/17/2019, 9:41 AM  LOS: 3 days   Consultants:  Cardiology  Procedures:  None at this time  Antimicrobials:  Ceftriaxone  Interval history/Subjective:  Remains confused-is being fed at this time  Objective:  Vitals:  Vitals:   01/17/19 0525 01/17/19 0923  BP: 115/77 (!) 88/63  Pulse: 73 73  Resp: 19 (!) 21  Temp: 98 F (36.7 C) 98 F (36.7 C)  SpO2: 97% 94%    Exam:  Confused pleasant but oriented to person only no apparent distress EOMI NCAT Chest clinically clear Abdomen soft no rebound no guarding No lower extremity edema Neurologically intact   I have personally reviewed the following:  DATA   Labs:   white count 12.2-->13.9--->8-->10  INR 1.6-->1.9   Scheduled Meds: . amiodarone  400 mg Oral BID  . carvedilol  3.125 mg Oral BID WC  . magnesium oxide  800 mg Oral BID  . sodium chloride flush  3 mL Intravenous Once  . sodium chloride flush  3 mL Intravenous Q12H  . warfarin  4 mg Oral ONCE-1800  . Warfarin - Pharmacist Dosing Inpatient   Does not apply q1800   Continuous Infusions: . sodium chloride    . sodium chloride 50 mL/hr at 01/17/19 0525  . cefTRIAXone (ROCEPHIN)  IV 2 g (01/16/19 1001)  . heparin 1,200 Units/hr (01/17/19 0750)  . 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

## 2019-01-17 NOTE — Progress Notes (Signed)
ANTICOAGULATION CONSULT NOTE - Follow Up Consult  Pharmacy Consult for Hep/Warfarin Indication: PAF, mMVR, h/o DVT and CVA,  No Known Allergies  Patient Measurements: Height: 5\' 6"  (167.6 cm) Weight: 140 lb 6.9 oz (63.7 kg) IBW/kg (Calculated) : 59.3 Heparin Dosing Weight: 64.4 kg  Vital Signs: Temp: 98 F (36.7 C) (05/24 1927) Temp Source: Oral (05/24 1927) BP: 91/67 (05/24 1927) Pulse Rate: 73 (05/24 1927)  Labs: Recent Labs    01/15/19 0344  01/16/19 0241 01/17/19 0559 01/17/19 1945  HGB 11.6*  --  10.9* 10.8*  --   HCT 35.3*  --  33.9* 33.9*  --   PLT 164  --  173 189  --   LABPROT 16.5*  --  19.2* 21.4*  --   INR 1.4*  --  1.6* 1.9*  --   HEPARINUNFRC  --    < > 0.31 0.24* 0.26*  CREATININE 0.88  --  0.89 0.89  --    < > = values in this interval not displayed.    Estimated Creatinine Clearance: 51.9 mL/min (by C-G formula based on SCr of 0.89 mg/dL).   Assessment:  Anticoag: Warfarin for PAF, mMVR, h/o DVT and CVA, HL 0.24 low end, INR 1.9 up, Hgb 11.6>10.8, pltc nml, took amio PTA.   Heparin level remains slightly below goal, per RN IV is having issues and IV team is en route to place new peripheral IV. Will continue current rate for now and recheck with morning labs.  Goal of Therapy:  Heparin level 0.3-0.7 units/ml  INR 2.5-3.5 Monitor platelets by anticoagulation protocol: Yes   Plan:  -Continue IV heparin 1200 units/hr -Recheck heparin level with daily labs   Arrie Senate, PharmD, BCPS Clinical Pharmacist [phone] Please check AMION for all [place] numbers 01/17/2019

## 2019-01-17 NOTE — Progress Notes (Signed)
Stable from an EP standpoint. Dr Lovena Le to see again tomorrow  Thompson Grayer MD, [facility] 01/17/2019 9:51 AM

## 2019-01-17 NOTE — Progress Notes (Addendum)
  Palliative medicine progress note  Patient seen, chart reviewed.  Patient is seen eating lunch.  She is more conversant today.  Less hesitant in her answers.  Affect less anxious.  She is denying shortness of breath or severe pain today.  As noted from my conversation with her husband on Friday, he administers Tylenol to her for pain.  Per chart review she has  Received 1 as needed of oxycodone  I asked Angela [name] if he had an opportunity to discuss goals of care that we talked about on the phone with his son.  He replied that he did.  He stated that there are no changes to goals.  Patient will continue to be a full code, ICD active and current scope of treatment.  His goal is to get her home as soon as possible.  We did discuss the role of hospice as additional support in the home.  He stated he does not feel like they are ready for that.   Patient Profile:72 y.o.femalewith past medical history of rheumatic heart disease, MI 2009, embolectomy 2003, mitral valve replacement 1983, history of V. fib arrest, ICD, history of colon cancer, dementia, history of DVT, A. fib, history of CVA, cardiomyopathy, ejection fraction 15 to 20% Fournier's gangrene to perianal areaadmitted on 5/21/2020with increased weakness, increased confusion, nausea and vomiting. Patient was found to have a urinary tract infection. In the emergency room she was noted to have multiple episodes of nonsustained V. Tach.She was admitted with sepsis, positive blood cultures for e. Coli  Consult ordered for goals of care. Patient has seen palliative medicine services going back to 2018 and as recently as April 2020 when she was admitted for Fournier's gangrene to perianal area..   Patient infection seems to be resolving.  At this point per chart review, waiting for therapeutic level of anticoagulation.  Plan Schedule tylenol 650 TID for perianal pain Continue full scope of treatment including active ICD, full code Home with  home health when medically ready Patient would clearly benefit from ongoing goals of care.  Hospitalizations have been hard on Angela [name]. When services resume, would recommend outpatient palliative medicine support either through [place] at [phone] or hospice and palliative care of [facility] palliative medicine division at [phone]  Thank you, Angela Curls, Angela [name] Total time: 15 minutes Greater than 50% of time was spent in counseling and coordination of care

## 2019-01-18 LAB — CBC WITH DIFFERENTIAL/PLATELET
Abs Immature Granulocytes: 0.05 10*3/uL (ref 0.00–0.07)
Basophils Absolute: 0 10*3/uL (ref 0.0–0.1)
Basophils Relative: 0 %
Eosinophils Absolute: 0 10*3/uL (ref 0.0–0.5)
Eosinophils Relative: 1 %
HCT: 35 % — ABNORMAL LOW (ref 36.0–46.0)
Hemoglobin: 11.1 g/dL — ABNORMAL LOW (ref 12.0–15.0)
Immature Granulocytes: 1 %
Lymphocytes Relative: 22 %
Lymphs Abs: 1.3 10*3/uL (ref 0.7–4.0)
MCH: 29.6 pg (ref 26.0–34.0)
MCHC: 31.7 g/dL (ref 30.0–36.0)
MCV: 93.3 fL (ref 80.0–100.0)
Monocytes Absolute: 0.6 10*3/uL (ref 0.1–1.0)
Monocytes Relative: 10 %
Neutro Abs: 3.8 10*3/uL (ref 1.7–7.7)
Neutrophils Relative %: 66 %
Platelets: 198 10*3/uL (ref 150–400)
RBC: 3.75 MIL/uL — ABNORMAL LOW (ref 3.87–5.11)
RDW: 18.4 % — ABNORMAL HIGH (ref 11.5–15.5)
WBC: 5.8 10*3/uL (ref 4.0–10.5)
nRBC: 0 % (ref 0.0–0.2)

## 2019-01-18 LAB — RENAL FUNCTION PANEL
Albumin: 2.5 g/dL — ABNORMAL LOW (ref 3.5–5.0)
Anion gap: 12 (ref 5–15)
BUN: 15 mg/dL (ref 8–23)
CO2: 20 mmol/L — ABNORMAL LOW (ref 22–32)
Calcium: 8.4 mg/dL — ABNORMAL LOW (ref 8.9–10.3)
Chloride: 103 mmol/L (ref 98–111)
Creatinine, Ser: 0.9 mg/dL (ref 0.44–1.00)
GFR calc Af Amer: >60 mL/min (ref >60)
GFR calc non Af Amer: >60 mL/min (ref >60)
Glucose, Bld: 113 mg/dL — ABNORMAL HIGH (ref 70–99)
Phosphorus: 2.8 mg/dL (ref 2.5–4.6)
Potassium: 3.7 mmol/L (ref 3.5–5.1)
Sodium: 135 mmol/L (ref 135–145)

## 2019-01-18 LAB — PROTIME-INR
INR: 2.6 — ABNORMAL HIGH (ref 0.8–1.2)
Prothrombin Time: 27.8 seconds — ABNORMAL HIGH (ref 11.4–15.2)

## 2019-01-18 LAB — HEPARIN LEVEL (UNFRACTIONATED): Heparin Unfractionated: 0.42 IU/mL (ref 0.30–0.70)

## 2019-01-18 MED ORDER — CEPHALEXIN 500 MG PO CAPS
500.0000 mg | ORAL_CAPSULE | Freq: Two times a day (BID) | ORAL | Status: DC
  Administered 2019-01-18 – 2019-01-19 (×4): 500 mg via ORAL
  Filled 2019-01-18 (×3): qty 1

## 2019-01-18 MED ORDER — WARFARIN SODIUM 4 MG PO TABS
4.0000 mg | ORAL_TABLET | Freq: Once | ORAL | Status: AC
  Administered 2019-01-18: 18:00:00 4 mg via ORAL
  Filled 2019-01-18: qty 1

## 2019-01-18 NOTE — Progress Notes (Signed)
Patient ID: [name], female   DOB: [birth year], 73 y.o.   MRN: [id number]   Progress Note  Patient Name: Angela [name] Date of Encounter: 01/18/2019  Primary Cardiologist: Cristopher Peru, MD   Subjective   No complaints. Denies chest pain or sob or perineal pain.  Inpatient Medications    Scheduled Meds: . acetaminophen  650 mg Oral TID  . amiodarone  400 mg Oral BID  . carvedilol  3.125 mg Oral BID WC  . magnesium oxide  800 mg Oral BID  . sodium chloride flush  3 mL Intravenous Once  . sodium chloride flush  3 mL Intravenous Q12H  . warfarin  4 mg Oral ONCE-1800  . Warfarin - Pharmacist Dosing Inpatient   Does not apply q1800   Continuous Infusions: . sodium chloride    . cefTRIAXone (ROCEPHIN)  IV 2 g (01/18/19 0952)  . dextrose 5 % and 0.9% NaCl 50 mL/hr at 01/18/19 0558  . magnesium sulfate bolus IVPB Stopped (01/17/19 1022)   PRN Meds: sodium chloride, ondansetron (ZOFRAN) IV, ondansetron **OR** ondansetron (ZOFRAN) IV, oxyCODONE, sodium chloride flush   Vital Signs    Vitals:   01/17/19 1927 01/18/19 0033 01/18/19 0531 01/18/19 0843  BP: 91/67 91/62 99/74  110/78  Pulse: 73 75 76 75  Resp: (!) 23 20 (!) 24 (!) 27  Temp: 98 F (36.7 C) 98 F (36.7 C) 98.5 F (36.9 C) 98 F (36.7 C)  TempSrc: Oral Oral Oral Oral  SpO2: 98% 95% 97% 96%  Weight:   67.1 kg   Height:        Intake/Output Summary (Last 24 hours) at 01/18/2019 1032 Last data filed at 01/18/2019 2202 Gross per 24 hour  Intake 1341.54 ml  Output 750 ml  Net 591.54 ml   Filed Weights   01/16/19 0428 01/17/19 0525 01/18/19 0531  Weight: 64.4 kg 63.7 kg 67.1 kg    Telemetry    Atrial fib with ventricular pacing - Personally Reviewed  ECG    none - Personally Reviewed  Physical Exam   GEN: No acute distress.   Neck: No JVD Cardiac: RRR Respiratory: no increased work of breathing. GI:  non-distended  MS: No edema; No deformity. Neuro:  Nonfocal  Psych: Normal affect   Labs     Chemistry Recent Labs  Lab 01/14/19 1548 01/15/19 0344 01/16/19 0241 01/17/19 0559 01/18/19 0448  NA 138 137 133* 139 135  K 4.6 4.3 3.5 3.7 3.7  CL 106 103 104 108 103  CO2 22 23 19* 19* 20*  GLUCOSE 121* 133* 120* 97 113*  BUN 16 14 15 17 15   CREATININE 0.85 0.88 0.89 0.89 0.90  CALCIUM 8.7* 8.9 8.4* 8.4* 8.4*  PROT 6.1* 6.4* 6.1*  --   --   ALBUMIN 2.9* 2.7* 2.5* 2.4* 2.5*  AST 30 23 43*  --   --   ALT 15 16 35  --   --   ALKPHOS 53 48 50  --   --   BILITOT 0.9 1.2 0.7  --   --   GFRNONAA >60 >60 >60 >60 >60  GFRAA >60 >60 >60 >60 >60  ANIONGAP 10 11 10 12 12      Hematology Recent Labs  Lab 01/16/19 0241 01/17/19 0559 01/18/19 0448  WBC 8.5 10.4 5.8  RBC 3.61* 3.64* 3.75*  HGB 10.9* 10.8* 11.1*  HCT 33.9* 33.9* 35.0*  MCV 93.9 93.1 93.3  MCH 30.2 29.7 29.6  MCHC 32.2 31.9 31.7  RDW  18.7* 18.3* 18.4*  PLT 173 189 198    Cardiac EnzymesNo results for input(s): TROPONINI in the last 168 hours. No results for input(s): TROPIPOC in the last 168 hours.   BNPNo results for input(s): BNP, PROBNP in the last 168 hours.   DDimer No results for input(s): DDIMER in the last 168 hours.   Radiology    No results found.  Cardiac Studies   none  Patient Profile     73 y.o. female admitted with VF storm. She had stopped amiodarone.  Assessment & Plan    1. VT - she has quieted down on amiodarone. We will continue po for another day. Could be discharged tomorrow if no VT and able to get out of bed. 2. CHB - she is pacing almost 100% of the time. 3. Chronic systolic heart failure - her low bp prevents optimal medical therapy with an ACE/beta blocker.     For questions or updates, please contact [place] Please consult [email or web address] for contact info under Cardiology/STEMI.      Signed, Cristopher Peru, MD  01/18/2019, 10:32 AM

## 2019-01-18 NOTE — Evaluation (Signed)
Physical Therapy Evaluation Patient Details Name: Angela [name] MRN: [id number] DOB: [birth year] Today's Date: 01/18/2019   History of Present Illness  Pt is a 73 y/o female who was admitted with VF storm. PMH significant for CHF, SSS, rheumatic heart disease, non-ischemic cardiomyopathy, DVT, CVA, v-fib, a-fib.  Clinical Impression  Pt admitted with above diagnosis. Pt currently with functional limitations due to the deficits listed below (see PT Problem List). At the time of PT eval pt appeared very weak and reports she "does not feel like" participating. Pt states "I am so dizzy, I feel like I'm going to fall" upon sitting up on EOB. Pt unable to progress to OOB transfers or even MMT at EOB. PTA pt reports she was mobilizing well around her home with the quad cane. Based on performance today, feel SNF is the most appropriate d/c disposition as she is requiring max assist for transfers to/from EOB. Will plan to attempt mobility progression prior to d/c as it was noted in the chart the pt/family are hoping for return home. Acutely, pt will benefit from skilled PT to increase their independence and safety with mobility to allow discharge to the venue listed below.       Follow Up Recommendations SNF;Supervision/Assistance - 24 hour    Equipment Recommendations  None recommended by PT    Recommendations for Other Services       Precautions / Restrictions Precautions Precautions: Fall Restrictions Weight Bearing Restrictions: No      Mobility  Bed Mobility Overal bed mobility: Needs Assistance Bed Mobility: Supine to Sit;Sit to Supine     Supine to sit: Max assist Sit to supine: Max assist   General bed mobility comments: Pt required hand-over-hand assist to reach for rails and bed pad use to scoot around fully to EOB. Pt with increased effort to attempt to scoot L hip fully out so feet were on floor but unable to advance her hips. Bed pad again used to assist. Once sitting up pt  required consistent mod assist for sitting balance and pt reports dizziness. Reports she does not feel she can do any more and asks to lay back down.   Transfers                 General transfer comment: Unable to advance 2 dizziness  Ambulation/Gait                Stairs            Wheelchair Mobility    Modified Rankin (Stroke Patients Only)       Balance Overall balance assessment: Needs assistance Sitting-balance support: Feet supported;No upper extremity supported Sitting balance-Leahy Scale: Poor Sitting balance - Comments: Mod A                                     Pertinent Vitals/Pain Pain Assessment: No/denies pain    Home Living Family/patient expects to be discharged to:: Private residence Living Arrangements: Spouse/significant other;Children Available Help at Discharge: Family;Available 24 hours/day Type of Home: House Home Access: Stairs to enter Entrance Stairs-Rails: Right Entrance Stairs-Number of Steps: 2 Home Layout: One level Home Equipment: Walker - 2 wheels;Cane - quad;Shower seat      Prior Function Level of Independence: Independent with assistive device(s)         Comments: Pt reports using quad cane all the time and was able to complete ADL's independently. Is able  to do some cooking/cleaning.      Hand Dominance   Dominant Hand: Right    Extremity/Trunk Assessment   Upper Extremity Assessment Upper Extremity Assessment: Generalized weakness    Lower Extremity Assessment Lower Extremity Assessment: Generalized weakness    Cervical / Trunk Assessment Cervical / Trunk Assessment: Kyphotic;Other exceptions Cervical / Trunk Exceptions: Forward head posture with rounded shoulders  Communication   Communication: No difficulties  Cognition Arousal/Alertness: Awake/alert Behavior During Therapy: Flat affect Overall Cognitive Status: No family/caregiver present to determine baseline cognitive  functioning                                 General Comments: Appears to be confused with PT's role and why therapy was present and asking her to mobilize even after multiple attempts to explain      General Comments      Exercises     Assessment/Plan    PT Assessment Patient needs continued PT services  PT Problem List Decreased strength;Decreased activity tolerance;Decreased balance;Decreased mobility;Decreased cognition;Decreased knowledge of use of DME;Decreased safety awareness;Decreased knowledge of precautions;Cardiopulmonary status limiting activity       PT Treatment Interventions DME instruction;Functional mobility training;Gait training;Stair training;Therapeutic activities;Therapeutic exercise;Balance training;Neuromuscular re-education;Patient/family education;Cognitive remediation    PT Goals (Current goals can be found in the Care Plan section)  Acute Rehab PT Goals Patient Stated Goal: Return home ASAP PT Goal Formulation: Patient unable to participate in goal setting Time For Goal Achievement: 02/01/19 Potential to Achieve Goals: Good    Frequency Min 3X/week   Barriers to discharge        Co-evaluation               AM-PAC PT "6 Clicks" Mobility  Outcome Measure Help needed turning from your back to your side while in a flat bed without using bedrails?: A Little Help needed moving from lying on your back to sitting on the side of a flat bed without using bedrails?: A Lot Help needed moving to and from a bed to a chair (including a wheelchair)?: Total Help needed standing up from a chair using your arms (e.g., wheelchair or bedside chair)?: Total Help needed to walk in hospital room?: Total Help needed climbing 3-5 steps with a railing? : Total 6 Click Score: 9    End of Session   Activity Tolerance: Patient limited by fatigue(Dizziness) Patient left: in bed;with call bell/phone within reach;with bed alarm set Nurse Communication:  Mobility status PT Visit Diagnosis: Unsteadiness on feet (R26.81);Muscle weakness (generalized) (M62.81);Difficulty in walking, not elsewhere classified (R26.2)    Time: 4696-2952 PT Time Calculation (min) (ACUTE ONLY): 34 min   Charges:   PT Evaluation $PT Eval Moderate Complexity: 1 Mod PT Treatments $Therapeutic Activity: 8-22 mins        Rolinda Roan, PT, DPT Acute Rehabilitation Services Pager: [phone] Office: [place] 01/18/2019, 3:48 PM

## 2019-01-18 NOTE — Progress Notes (Addendum)
[name] PRO[facility]ESS [facility]  Angela [name] YBW:[id number] DOB: [birth year] DOA: 01/14/2019 PCP: Lorene Dy, MD Narrative:  73 year old African-American female Significant cardiac comorbidities left bundle branch block chronic systolic heart failure VF arrest complete heart block status post AV nodal ablation Baseline ejection fraction 15% Mechanical mitral valve 1983 GI bleed 05/08/2017 Prior history of colon cancer Stage VII dementia  Recent hospitalization 4/2-4/14 2020 with fourniers gangrene--- patient was admitted with VF shocks while awaiting bed placement from ED Cardiology saw her she was on a lidocaine and amiodarone drip at that time and was sent home on Coumadin At that admission palliative care saw the patient and they were not apparently receptive to palliative services She was readmitted 5/21- ,confused toxic metabolic encephalopathy  patient was admitted with VF shocks while awaiting bed placement from ED multiple episodes of nonsustained SVT/VT in the hospital On admission she met sepsis criteria lactic acid 1.8 white count 12 T-max 100.5 BP 97/64 respiratory rate 40   A & Plan Severe sepsis both bottles E. coli again--transition from ceftriaxone 2 g daily to Keflex ending 5/31 [place]ine lock cardiac conditions A. fib sick sinus syndrome pacemaker placement nonischemic cardiomyopathy This admission had Code Blue event WF arrest and resusc Converted from IV GTT-->amiodarone 400 twice daily, Coreg 3.125 twice daily INR now therapeutic-rpt labs am BP low normal, but in view of preventing VT, would keep meds unchanged Recent treatment of fopurnier'sgangrene Wound care as needed-stable Needs to be OOB--PT consulted as patient much more coherent and approp today Prior GI bleed and colon cancer history Stable--stop checking labs after next blood draw while hosptialized Profound dementia, stage VII  Appreciate effort by palliative care and their engagement Moderate  malnutrition BMI is 22 Appreciate palliative medicine input and trying to delineate goals   DVT coumadin  code Status: Patient remains full code  communication: [place] Situ spouse updated disposition Plan: Inpatient until therapeutic on anticoagulation   Verlon Au, MD  [facility] Via [employer] app OR -[email or web address] 7PM-7AM contact night coverage as above 01/18/2019, 1:53 PM  LOS: 4 days   Consultants:  Cardiology  Procedures:  None at this time  Antimicrobials:  Ceftriaxone  Interval history/Subjective:  More coherent interactive-able to tell me where she is No pain  eatign lunch independently No cough  Objective:  Vitals:  Vitals:   01/18/19 0531 01/18/19 0843  BP: 99/74 110/78  Pulse: 76 75  Resp: (!) 24 (!) 27  Temp: 98.5 F (36.9 C) 98 F (36.7 C)  SpO2: 97% 96%    Exam:  More oriented to person, can tell me at [facility] Bitemporal wasting EOMI NCAT Chest clinically clear Abdomen soft no rebound no guarding No lower extremity edema Neurologically intact   I have personally reviewed the following:  DATA   Labs:   white count 12.2-->13.9--->8-->10-->5  INR 1.6-->1.9-->2.6   Scheduled Meds: . acetaminophen  650 mg Oral TID  . amiodarone  400 mg Oral BID  . carvedilol  3.125 mg Oral BID WC  . magnesium oxide  800 mg Oral BID  . sodium chloride flush  3 mL Intravenous Once  . sodium chloride flush  3 mL Intravenous Q12H  . warfarin  4 mg Oral ONCE-1800  . Warfarin - Pharmacist Dosing Inpatient   Does not apply q1800   Continuous Infusions: . sodium chloride    . 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

## 2019-01-18 NOTE — Progress Notes (Signed)
ANTICOAGULATION CONSULT NOTE - Follow Up Consult  Pharmacy Consult for Hep/Warfarin Indication: PAF, mMVR, h/o DVT and CVA,  No Known Allergies  Patient Measurements: Height: 5\' 6"  (167.6 cm) Weight: 147 lb 14.9 oz (67.1 kg) IBW/kg (Calculated) : 59.3 Heparin Dosing Weight: 64.4 kg  Vital Signs: Temp: 98.5 F (36.9 C) (05/25 0531) Temp Source: Oral (05/25 0531) BP: 99/74 (05/25 0531) Pulse Rate: 76 (05/25 0531)  Labs: Recent Labs    01/16/19 0241 01/17/19 0559 01/17/19 1945 01/18/19 0448 01/18/19 0652  HGB 10.9* 10.8*  --  11.1*  --   HCT 33.9* 33.9*  --  35.0*  --   PLT 173 189  --  198  --   LABPROT 19.2* 21.4*  --  27.8*  --   INR 1.6* 1.9*  --  2.6*  --   HEPARINUNFRC 0.31 0.24* 0.26*  --  0.42  CREATININE 0.89 0.89  --  0.90  --     Estimated Creatinine Clearance: 51.3 mL/min (by C-G formula based on SCr of 0.9 mg/dL).   Assessment:  Anticoag: Warfarin for PAF, mMVR, h/o DVT and CVA.  5/25 AM: New peripheral line was placed last night. Heparin level now therapeutic on 1200 units/hr at 0.42. Given that INR now therapeutic at 2.6; will DC heparin infusion after discussed with MD. INR bumped 1.9>>2.6 today. Will continue on reduced 4mg  dose d/t dietary intake <50%. May need lower if continues to uptrend tomorrow. Hgb 11.1, pltc 198; stable.   Goal of Therapy:  Heparin level 0.3-0.7 units/ml  INR 2.5-3.5 Monitor platelets by anticoagulation protocol: Yes   Plan:  Discontinue IV heparin infusion Warfarin 4mg  x1 tonight Daily INRs  Thank you for involving pharmacy in this patient's care.  Janae Bridgeman, PharmD PGY1 Pharmacy Resident Phone: [phone] 01/18/2019 7:36 AM

## 2019-01-19 ENCOUNTER — Encounter (HOSPITAL_COMMUNITY): Payer: Self-pay | Admitting: General Practice

## 2019-01-19 ENCOUNTER — Telehealth: Payer: Medicare HMO | Admitting: Internal Medicine

## 2019-01-19 DIAGNOSIS — N390 Urinary tract infection, site not specified: Secondary | ICD-10-CM

## 2019-01-19 HISTORY — DX: Urinary tract infection, site not specified: N39.0

## 2019-01-19 LAB — COMPREHENSIVE METABOLIC PANEL
ALT: 27 U/L (ref 0–44)
AST: 17 U/L (ref 15–41)
Albumin: 2.5 g/dL — ABNORMAL LOW (ref 3.5–5.0)
Alkaline Phosphatase: 40 U/L (ref 38–126)
Anion gap: 10 (ref 5–15)
BUN: 14 mg/dL (ref 8–23)
CO2: 19 mmol/L — ABNORMAL LOW (ref 22–32)
Calcium: 8.6 mg/dL — ABNORMAL LOW (ref 8.9–10.3)
Chloride: 110 mmol/L (ref 98–111)
Creatinine, Ser: 0.88 mg/dL (ref 0.44–1.00)
GFR calc Af Amer: >60 mL/min (ref >60)
GFR calc non Af Amer: >60 mL/min (ref >60)
Glucose, Bld: 103 mg/dL — ABNORMAL HIGH (ref 70–99)
Potassium: 3.6 mmol/L (ref 3.5–5.1)
Sodium: 139 mmol/L (ref 135–145)
Total Bilirubin: 0.5 mg/dL (ref 0.3–1.2)
Total Protein: 6.1 g/dL — ABNORMAL LOW (ref 6.5–8.1)

## 2019-01-19 LAB — PROTIME-INR
INR: 3 — ABNORMAL HIGH (ref 0.8–1.2)
Prothrombin Time: 30.7 seconds — ABNORMAL HIGH (ref 11.4–15.2)

## 2019-01-19 LAB — MAGNESIUM: Magnesium: 2 mg/dL (ref 1.7–2.4)

## 2019-01-19 MED ORDER — CEPHALEXIN 500 MG PO CAPS: 500.0000 mg | ORAL_CAPSULE | Freq: Two times a day (BID) | ORAL | 0 refills | Status: AC

## 2019-01-19 MED ORDER — AMIODARONE HCL 200 MG PO TABS: ORAL_TABLET | ORAL | 0 refills | Status: DC

## 2019-01-19 NOTE — Progress Notes (Signed)
Physical Therapy Treatment Patient Details Name: Angela [name] MRN: [id number] DOB: [birth year] Today's Date: 01/19/2019    History of Present Illness Pt is a 73 y/o female who was admitted with VF storm. PMH significant for CHF, SSS, rheumatic heart disease, non-ischemic cardiomyopathy, DVT, CVA, v-fib, a-fib.    PT Comments    Pt pleasant caddy corner in bed on arrival asking where her spouse is. Pt not oriented and states she walks with a cane at home. Per MD in room who talked to spouse he wants her to return home and needs all DME. Pt is a heavy mod-max assist for transfers with progressive weakness with fatigue and am concerned spouse cannot physically manage her at home. However, with his denial of SNF maximize HHPT/OT/ RN as well as DME below. Will continue to follow acutely with RN present and aware of mobility during session. Pt with sacral wound and RN dressing.   Pt reported dizziness EOB but vitals stable BP 120/80( 93) HR 73 SPO2 96% on RA    Follow Up Recommendations  SNF;Supervision/Assistance - 24 hour;Home health PT     Equipment Recommendations  Rolling walker with 5" wheels;3in1 (PT);Other (comment)(transport chair)    Recommendations for Other Services       Precautions / Restrictions Precautions Precautions: Fall    Mobility  Bed Mobility Overal bed mobility: Needs Assistance Bed Mobility: Supine to Sit     Supine to sit: Mod assist     General bed mobility comments: mod assist with multimodal cues and increased time to pivot to left side of bed and elevate trunk  Transfers Overall transfer level: Needs assistance   Transfers: Sit to/from Stand;Stand Pivot Transfers Sit to Stand: Mod assist;Max assist Stand pivot transfers: Mod assist       General transfer comment: pt able to stand 2 trials from bed with mod assist with cues for hand placement and assist to initiate and rise. additional 2 trials from chair with max assist to rise and stabilze.  Pt requires UE support and fatigues quickly. Grossly 1 min in standing last trial for sacral dressing by nurse with therapist physically holding pt up. Pivot bed to chair with RW with mod assist  Ambulation/Gait Ambulation/Gait assistance: Mod assist Gait Distance (Feet): 12 Feet Assistive device: Rolling walker (2 wheeled) Gait Pattern/deviations: Step-through pattern;Decreased stride length;Trunk flexed;Narrow base of support   Gait velocity interpretation: <1.8 ft/sec, indicate of risk for recurrent falls General Gait Details: pt with assist to direct and advance RW, mod assist for balance with multimodal cues to sequence and advance gait   Stairs             Wheelchair Mobility    Modified Rankin (Stroke Patients Only)       Balance Overall balance assessment: Needs assistance Sitting-balance support: Feet supported;No upper extremity supported Sitting balance-Leahy Scale: Fair Sitting balance - Comments: supervision for sitting balance EOB   Standing balance support: Bilateral upper extremity supported Standing balance-Leahy Scale: Poor Standing balance comment: reliant on bil UE support in standing                            Cognition Arousal/Alertness: Awake/alert Behavior During Therapy: Flat affect Overall Cognitive Status: No family/caregiver present to determine baseline cognitive functioning                                 General  Comments: pt not oriented to place, time or situation asking where her husband is several times during session      Exercises      General Comments        Pertinent Vitals/Pain Pain Assessment: No/denies pain    Home Living                      Prior Function            PT Goals (current goals can now be found in the care plan section) Progress towards PT goals: Progressing toward goals    Frequency           PT Plan Current plan remains appropriate    Co-evaluation               AM-PAC PT "6 Clicks" Mobility   Outcome Measure  Help needed turning from your back to your side while in a flat bed without using bedrails?: A Little Help needed moving from lying on your back to sitting on the side of a flat bed without using bedrails?: A Lot Help needed moving to and from a bed to a chair (including a wheelchair)?: A Lot Help needed standing up from a chair using your arms (e.g., wheelchair or bedside chair)?: A Lot Help needed to walk in hospital room?: A Lot Help needed climbing 3-5 steps with a railing? : Total 6 Click Score: 12    End of Session Equipment Utilized During Treatment: Gait belt Activity Tolerance: Patient limited by fatigue Patient left: in chair;with call bell/phone within reach;with chair alarm set;with nursing/sitter in room Nurse Communication: Mobility status;Precautions       Time: 3606-7703 PT Time Calculation (min) (ACUTE ONLY): 29 min  Charges:  $Therapeutic Activity: 23-37 mins                     [place] Pager: [phone] Office: [place] 01/19/2019, 11:55 AM

## 2019-01-19 NOTE — Plan of Care (Signed)
  Problem: Activity: Goal: Risk for activity intolerance will decrease Outcome: Progressing   Problem: Nutrition: Goal: Adequate nutrition will be maintained Outcome: Progressing   Problem: Pain Managment: Goal: General experience of comfort will improve Outcome: Progressing   Problem: Safety: Goal: Ability to remain free from injury will improve Outcome: Progressing   Problem: Skin Integrity: Goal: Risk for impaired skin integrity will decrease Outcome: Progressing   

## 2019-01-19 NOTE — TOC Initial Note (Signed)
Transition of Care [facility]) - Initial/Assessment Note    Patient Details  Name: Angela [name] MRN: [id number] Date of Birth: [birth year]  Transition of [facility]) CM/SW Contact:    Bethena Roys, RN Phone Number: 01/19/2019, 1:03 PM  Clinical Narrative: Pt presented for acute metabolic encephalopathy. PTA from home with support of husband. PT recommendations for SNF- husband wants patient to return home with [facility]. Palliative consulted- per notes plan full scope of treatment. CM did speak with patients husband and he wants to continue with [facility] for Services. SOC to begin within 24-48 hours post transition home. CM did make Dan aware of additional services. SOC to begin within 24-48 hours post transition home. No further needs from CM at this time.                   Expected Discharge Plan: [place] Barriers to Discharge: No Barriers Identified   Patient Goals and CMS Choice   CMS Medicare.gov Compare Post Acute Care list provided to:: (Patient is active with [facility]- Medicare.Gov list not provided. ) Choice offered to / list presented to : NA  Expected Discharge Plan and Services Expected Discharge Plan: [place] In-house Referral: NA Discharge Planning Services: CM Consult Post Acute Care Choice: [place] arrangements for the past 2 months: Single Family Home Expected Discharge Date: 01/19/19               DME Arranged: Wheelchair manual, Walker rolling DME Agency: [employer] Date DME Agency Contacted: 01/19/19 Time DME Agency Contacted: 16 Representative spoke with at DME Agency: [place] Arranged: RN, Disease Management, PT, OT, Nurse's Aide, Social Work [employer] Agency: Kathryn ([place]) Date [place]: 01/19/19 Time [place]: 1130 Representative spoke with at [place] Arrangements/Services Living arrangements for the past 2 months: [place]  with:: Spouse Patient language and need for interpreter reviewed:: Yes Do you feel safe going back to the place where you live?: Yes      Need for Family Participation in Patient Care: Yes (Comment) Care giver support system in place?: Yes (comment) Current home services: Home RN Criminal Activity/Legal Involvement Pertinent to Current Situation/Hospitalization: No - Comment as needed  Activities of Daily Living      Permission Sought/Granted Permission sought to share information with : Family Supports                Emotional Assessment Appearance:: Appears stated age Attitude/Demeanor/Rapport: Engaged Affect (typically observed): Accepting Orientation: : Fluctuating Orientation (Suspected and/or reported Sundowners) Alcohol / Substance Use: Not Applicable Psych Involvement: No (comment)  Admission diagnosis:  Encephalopathy [G93.40] Urinary tract infection without hematuria, site unspecified [N39.0] NSVT (nonsustained ventricular tachycardia) ([place]) [I47.2] Patient Active Problem List   Diagnosis Date Noted  . Perianal pain   . Acute metabolic encephalopathy 17/51/0258  . UTI (urinary tract infection) 01/14/2019  . Protein-calorie malnutrition, severe 12/08/2018  . Goals of care, counseling/discussion   . 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 Syncope 11/26/2018  . Ventricular tachycardia ([place]) 11/26/2018  . Elevated INR 11/26/2018  . Acute on chronic systolic (congestive) heart failure ([place]) 10/27/2018  . Elevated troponin 10/26/2018  . Anemia 10/26/2018  . Palliative care by specialist   . Rectal bleeding 04/28/2017  . Acute blood loss anemia 04/28/2017  . ICD (implantable cardioverter-defibrillator), biventricular, in situ 08/24/2014  . Memory deficit 08/23/2014  . Abnormality of gait 08/23/2014  .  Dementia ([place]) 12/17/2013  . 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 Hypotension due to drugs 07/12/2012  . Hypokalemia 07/12/2012  . NSVT  (nonsustained ventricular tachycardia) ([place]) 07/12/2012  . Acute on chronic systolic heart failure ([place]) 07/07/2012  . Ventricular fibrillation ([place]) 11/11/2011  . Warfarin anticoagulation 09/22/2011  . Nonischemic cardiomyopathy ([place])   . Atrial fibrillation ([place])   . History of colon cancer   . Internal hemorrhoids   . Rheumatic heart disease   . Sick sinus syndrome ([place])   . Systolic CHF, chronic ([place])   . DIVERTICULAR DISEASE 06/05/2009   PCP:  Lorene Dy, MD Pharmacy:   [facility], [address] [place] [address] Phone: [phone] Fax: [phone]     Social Determinants of Health (SDOH) Interventions    Readmission Risk Interventions Readmission Risk Prevention Plan 01/19/2019 12/05/2018 12/05/2018  Transportation Screening Complete - Complete  PCP or Specialist Appt within 3-5 Days - - Complete  HRI or Home Care Consult Complete Complete (No Data)  Social Work Consult for [place] Planning/Counseling Complete Complete -  Palliative Care Screening Complete - Not Applicable  Medication Review Press photographer) Complete - Complete  Some recent data might be hidden

## 2019-01-19 NOTE — Care Management Important Message (Signed)
Important Message  Patient Details  Name: Angela [name] MRN: [id number] Date of Birth: 1946/08/05   Medicare Important Message Given:  Yes  Due to illness patient was not able to sign.  Unsigned copy left   [name] 01/19/2019, 2:13 PM

## 2019-01-19 NOTE — Discharge Summary (Signed)
Physician Discharge Summary  Angela [name] GHW:[id number] DOB: December 19, 1945 DOA: 01/14/2019  PCP: Lorene Dy, MD  Admit date: 01/14/2019 Discharge date: 01/19/2019  Time spent: 40 minutes  Recommendations for Outpatient Follow-up:  1. Needs repeat labs Chem-12 CBC magnesium in 1 week-check INR in 4 to 5 days goal INR 2.5-3.5 2. Please note dosage changes of amiodarone below 3. Patient should be plugged in with palliative care as an outpatient given repetitive recurrent hospitalizations with no specific improvement in prognosis 4. Complete Keflex for E. coli and suspect will recur 5. Consider outpatient urology referral  Discharge Diagnoses:  Principal Problem:   Acute metabolic encephalopathy Active Problems:   Nonischemic cardiomyopathy (HCC)   Atrial fibrillation (HCC)   Systolic CHF, chronic (HCC)   Warfarin anticoagulation   S/P Mechanical MVR (mitral valve replacement)   NSVT (nonsustained ventricular tachycardia) (HCC)   Dementia (HCC)   ICD (implantable cardioverter-defibrillator), biventricular, in situ   Fournier's gangrene in female   UTI (urinary tract infection)   Perianal pain   Discharge Condition: Guarded  Diet recommendation: Comfort  Filed Weights   01/17/19 0525 01/18/19 0531 01/19/19 0700  Weight: 63.7 kg 67.1 kg 67.5 kg    History of present illness:  73 year old African-American female Significant cardiac comorbidities left bundle branch block chronic systolic heart failure VF arrest complete heart block status post AV nodal ablation Baseline ejection fraction 15% Mechanical mitral valve 1983 GI bleed 05/08/2017 Prior history of colon cancer Stage VII dementia  Recent hospitalization 4/2-4/14 2020 with fourniers gangrene--- patient was admitted with VF shocks while awaiting bed placement from ED Cardiology saw her she was on a lidocaine and amiodarone drip at that time and was sent home on Coumadin At that admission palliative care saw the  patient and they were not apparently receptive to palliative services She was readmitted 5/21- ,confused toxic metabolic encephalopathy  patient was admitted with VF shocks while awaiting bed placement from ED multiple episodes of nonsustained SVT/VT in the hospital On admission she met sepsis criteria lactic acid 1.8 white count 12 T-max 100.5 BP 97/64 respiratory rate 40   Hospital Course:  Severe sepsis both bottles E. coli again--transition from ceftriaxone 2 g daily to Keflex ending 5/31 [place]ine lock cardiac conditions A. fib sick sinus syndrome pacemaker placement nonischemic cardiomyopathy This admission had Code Blue event WF arrest and resusc Converted from IV GTT-->amiodarone 400 twice daily, Coreg 3.125 twice daily INR now therapeutic-and goal INR needs to be between 2.5 and 3.5 BP low normal, but in view of preventing VT, would keep meds unchanged Recent treatment of fopurnier'sgangrene Wound care as needed-stable--- will need wet to dries on discharge-wound looks clean on my exam on day of discharge Needs to be OOB--PT consulted as patient much more coherent and approp today Prior GI bleed and colon cancer history Stable--stop checking labs after next blood draw while hosptialized Profound dementia, stage VII             Appreciate effort by palliative care and their engagement Moderate malnutrition BMI is 22 Appreciate palliative medicine input and trying to delineate goals   Long discussions with family today by myself and palliative care-family is not ready to discuss end-of-life but I think that this conversation should continue in the outpatient setting as her risk for hospitalization readmission is very very high  Consultations:  Cardiology  Discharge Exam: Vitals:   01/18/19 2335 01/19/19 0700  BP: (!) 90/58 131/87  Pulse: (!) 25 73  Resp: (!) 21 (!) 21  Temp:  98.1 F (36.7 C)  SpO2: (!) 70% 100%    General: Awake alert and coherent no icterus no pallor  arcus senilis is present Neck soft supple Cardiovascular: S1-S2 no murmur, rate controlled A. fib Respiratory: Clinically clear no added sound Abdomen soft Wound examined and right buttock 5 cm x 7 cm clean tunneling to some degree but no slough No lower extremity edema  Discharge Instructions   Discharge Instructions    Diet - low sodium heart healthy   Complete by:  As directed    Discharge instructions   Complete by:  As directed    Please make sure patient is seen in clinic at her regular doctor's office in about 1 week and labs are obtained We will have Dr. Crissie Sickles of cardiology follow-up with you in the outpatient setting for transitional visit We will also enlist palliative care to come and talk to you as an outpatient I would continue doing the dressing changes to her wound with wet-to-dry dressings Noticed that we have discontinued a couple of medications that are not compatible with end-of-life conditions   Increase activity slowly   Complete by:  As directed      Allergies as of 01/19/2019   No Known Allergies     Medication List    STOP taking these medications   cholecalciferol 1000 units tablet Commonly known as:  VITAMIN D   furosemide 40 MG tablet Commonly known as:  LASIX     TAKE these medications   amiodarone 200 MG tablet Commonly known as:  PACERONE Take 2 tablets (400 mg total) by mouth daily for 5 days, THEN 2 tablets (400 mg total) daily for 30 days. Start taking on:  Jan 19, 2019 What changed:  See the new instructions.   carvedilol 3.125 MG tablet Commonly known as:  COREG Take 1 tablet (3.125 mg total) by mouth 2 (two) times daily with a meal.   cephALEXin 500 MG capsule Commonly known as:  KEFLEX Take 1 capsule (500 mg total) by mouth every 12 (twelve) hours for 5 days.   warfarin 1 MG tablet Commonly known as:  COUMADIN Take 5 mg by mouth daily.      No Known Allergies    The results of significant diagnostics from this  hospitalization (including imaging, microbiology, ancillary and laboratory) are listed below for reference.    Significant Diagnostic Studies: Dg Chest Portable 1 View  Result Date: 01/14/2019 CLINICAL DATA:  Nausea and vomiting.  Fever x1 day. EXAM: PORTABLE CHEST 1 VIEW COMPARISON:  Chest x-ray dated 11/26/2018 FINDINGS: Bilateral pacemakers are again noted. The heart size is enlarged. Patient is status post prior median sternotomy and valve replacement. There is no pneumothorax. There is mild generalized volume overload. No large area of consolidation. IMPRESSION: Stable appearance of the chest. Electronically Signed   By: Constance Holster M.D.   On: 01/14/2019 15:27   Dg Abd Portable 1 View  Result Date: 01/14/2019 CLINICAL DATA:  Vomiting and fever EXAM: PORTABLE ABDOMEN - 1 VIEW COMPARISON:  11/30/2018 FINDINGS: The bowel gas pattern is normal. No radio-opaque calculi or other significant radiographic abnormality are seen. There is a mild-to-moderate amount of stool in the colon. Multiple phleboliths project over the patient's pelvis. IMPRESSION: Nonobstructive bowel gas pattern. Electronically Signed   By: Constance Holster M.D.   On: 01/14/2019 15:28    Microbiology: Recent Results (from the past 240 hour(s))  Culture, blood (routine x 2)     Status: Abnormal  Collection Time: 01/14/19  3:49 PM  Result Value Ref Range Status   Specimen Description BLOOD LEFT ARM  Final   Special Requests   Final    BOTTLES DRAWN AEROBIC AND ANAEROBIC Blood Culture results may not be optimal due to an inadequate volume of blood received in culture bottles   Culture  Setup Time   Final    GRAM NEGATIVE RODS IN BOTH AEROBIC AND ANAEROBIC BOTTLES CRITICAL VALUE NOTED.  VALUE IS CONSISTENT WITH PREVIOUSLY REPORTED AND CALLED VALUE.    Culture (A)  Final    ESCHERICHIA COLI SUSCEPTIBILITIES PERFORMED ON PREVIOUS CULTURE WITHIN THE LAST 5 DAYS. Performed at [facility], [place] [address],  [place], [place] [address]    Report Status 01/17/2019 FINAL  Final  Culture, blood (routine x 2)     Status: Abnormal   Collection Time: 01/14/19  3:50 PM  Result Value Ref Range Status   Specimen Description BLOOD RIGHT HAND  Final   Special Requests   Final    BOTTLES DRAWN AEROBIC AND ANAEROBIC Blood Culture adequate volume   Culture  Setup Time   Final    GRAM NEGATIVE RODS IN BOTH AEROBIC AND ANAEROBIC BOTTLES CRITICAL RESULT CALLED TO, READ BACK BY AND VERIFIED WITH: Salli Real [id number] 01/15/2019 Mena Goes Performed at [facility], [place] [address], [place], [place] [address]    Culture ESCHERICHIA COLI (A)  Final   Report Status 01/17/2019 FINAL  Final   Organism ID, Bacteria ESCHERICHIA COLI  Final      Susceptibility   Escherichia coli - MIC*    AMPICILLIN <=2 SENSITIVE Sensitive     CEFAZOLIN <=4 SENSITIVE Sensitive     CEFEPIME <=1 SENSITIVE Sensitive     CEFTAZIDIME <=1 SENSITIVE Sensitive     CEFTRIAXONE <=1 SENSITIVE Sensitive     CIPROFLOXACIN <=0.25 SENSITIVE Sensitive     GENTAMICIN <=1 SENSITIVE Sensitive     IMIPENEM <=0.25 SENSITIVE Sensitive     TRIMETH/SULFA <=20 SENSITIVE Sensitive     AMPICILLIN/SULBACTAM <=2 SENSITIVE Sensitive     PIP/TAZO <=4 SENSITIVE Sensitive     Extended ESBL NEGATIVE Sensitive     * ESCHERICHIA COLI  Blood Culture ID Panel (Reflexed)     Status: Abnormal   Collection Time: 01/14/19  3:50 PM  Result Value Ref Range Status   Enterococcus species NOT DETECTED NOT DETECTED Final   Listeria monocytogenes NOT DETECTED NOT DETECTED Final   Staphylococcus species NOT DETECTED NOT DETECTED Final   Staphylococcus aureus (BCID) NOT DETECTED NOT DETECTED Final   Streptococcus species NOT DETECTED NOT DETECTED Final   Streptococcus agalactiae NOT DETECTED NOT DETECTED Final   Streptococcus pneumoniae NOT DETECTED NOT DETECTED Final   Streptococcus pyogenes NOT DETECTED NOT DETECTED Final   Acinetobacter baumannii NOT DETECTED NOT  DETECTED Final   Enterobacteriaceae species DETECTED (A) NOT DETECTED Final    Comment: Enterobacteriaceae represent a large family of gram-negative bacteria, not a single organism. CRITICAL RESULT CALLED TO, READ BACK BY AND VERIFIED WITH: G. ABBOTT,PHARMD [id number] 01/15/2019 T. TYSOR    Enterobacter cloacae complex NOT DETECTED NOT DETECTED Final   Escherichia coli DETECTED (A) NOT DETECTED Final    Comment: CRITICAL RESULT CALLED TO, READ BACK BY AND VERIFIED WITH: G. ABBOTT,PHARMD [id number] 01/15/2019 T. TYSOR    Klebsiella oxytoca NOT DETECTED NOT DETECTED Final   Klebsiella pneumoniae NOT DETECTED NOT DETECTED Final   Proteus species NOT DETECTED NOT DETECTED Final   Serratia marcescens NOT  DETECTED NOT DETECTED Final   Carbapenem resistance NOT DETECTED NOT DETECTED Final   Haemophilus influenzae NOT DETECTED NOT DETECTED Final   Neisseria meningitidis NOT DETECTED NOT DETECTED Final   Pseudomonas aeruginosa NOT DETECTED NOT DETECTED Final   Candida albicans NOT DETECTED NOT DETECTED Final   Candida glabrata NOT DETECTED NOT DETECTED Final   Candida krusei NOT DETECTED NOT DETECTED Final   Candida parapsilosis NOT DETECTED NOT DETECTED Final   Candida tropicalis NOT DETECTED NOT DETECTED Final    Comment: Performed at [facility], [place] [address], [place], [place] [address]  SARS Coronavirus 2 (CEPHEID- Performed in [place] hospital lab), Hosp Order     Status: None   Collection Time: 01/14/19  5:06 PM  Result Value Ref Range Status   SARS Coronavirus 2 NEGATIVE NEGATIVE Final    Comment: (NOTE) If result is NEGATIVE SARS-CoV-2 target nucleic acids are NOT DETECTED. The SARS-CoV-2 RNA is generally detectable in upper and lower  respiratory specimens during the acute phase of infection. The lowest  concentration of SARS-CoV-2 viral copies this assay can detect is 250  copies / mL. A negative result does not preclude SARS-CoV-2 infection  and should not be used as the sole  basis for treatment or other  patient management decisions.  A negative result may occur with  improper specimen collection / handling, submission of specimen other  than nasopharyngeal swab, presence of viral mutation(s) within the  areas targeted by this assay, and inadequate number of viral copies  (<250 copies / mL). A negative result must be combined with clinical  observations, patient history, and epidemiological information. If result is POSITIVE SARS-CoV-2 target nucleic acids are DETECTED. The SARS-CoV-2 RNA is generally detectable in upper and lower  respiratory specimens dur ing the acute phase of infection.  Positive  results are indicative of active infection with SARS-CoV-2.  Clinical  correlation with patient history and other diagnostic information is  necessary to determine patient infection status.  Positive results do  not rule out bacterial infection or co-infection with other viruses. If result is PRESUMPTIVE POSTIVE SARS-CoV-2 nucleic acids MAY BE PRESENT.   A presumptive positive result was obtained on the submitted specimen  and confirmed on repeat testing.  While 2019 novel coronavirus  (SARS-CoV-2) nucleic acids may be present in the submitted sample  additional confirmatory testing may be necessary for epidemiological  and / or clinical management purposes  to differentiate between  SARS-CoV-2 and other Sarbecovirus currently known to infect humans.  If clinically indicated additional testing with an alternate test  methodology [phone]) is advised. The SARS-CoV-2 RNA is generally  detectable in upper and lower respiratory sp ecimens during the acute  phase of infection. The expected result is Negative. Fact Sheet for Patients:  StrictlyIdeas.no Fact Sheet for Healthcare Providers: BankingDealers.co.za This test is not yet approved or cleared by the [place] FDA and has been authorized for detection  and/or diagnosis of SARS-CoV-2 by FDA under an Emergency Use Authorization (EUA).  This EUA will remain in effect (meaning this test can be used) for the duration of the COVID-19 declaration under Section 564(b)(1) of the Act, 21 U.S.C. section 360bbb-3(b)(1), unless the authorization is terminated or revoked sooner. Performed at [facility], [place] [address] [place], [place] [address]      Labs: Basic Metabolic Panel: Recent Labs  Lab 01/14/19 1950 01/15/19 0344 01/16/19 0241 01/17/19 0559 01/18/19 0448 01/19/19 0405  NA  --  137 133*  139 135 139  K  --  4.3 3.5 3.7 3.7 3.6  CL  --  103 104 108 103 110  CO2  --  23 19* 19* 20* 19*  GLUCOSE  --  133* 120* 97 113* 103*  BUN  --  14 15 17 15 14   CREATININE  --  0.88 0.89 0.89 0.90 0.88  CALCIUM  --  8.9 8.4* 8.4* 8.4* 8.6*  MG 2.0  --  1.8  --   --  2.0  PHOS  --   --   --  3.2 2.8  --    Liver Function Tests: Recent Labs  Lab 01/14/19 1548 01/15/19 0344 01/16/19 0241 01/17/19 0559 01/18/19 0448 01/19/19 0405  AST 30 23 43*  --   --  17  ALT 15 16 35  --   --  27  ALKPHOS 53 48 50  --   --  40  BILITOT 0.9 1.2 0.7  --   --  0.5  PROT 6.1* 6.4* 6.1*  --   --  6.1*  ALBUMIN 2.9* 2.7* 2.5* 2.4* 2.5* 2.5*   Recent Labs  Lab 01/14/19 1548  LIPASE 36   No results for input(s): AMMONIA in the last 168 hours. CBC: Recent Labs  Lab 01/14/19 1548 01/15/19 0344 01/16/19 0241 01/17/19 0559 01/18/19 0448  WBC 12.2* 13.9* 8.5 10.4 5.8  NEUTROABS  --   --  6.3 8.0* 3.8  HGB 12.0 11.6* 10.9* 10.8* 11.1*  HCT 39.3 35.3* 33.9* 33.9* 35.0*  MCV 97.8 92.9 93.9 93.1 93.3  PLT 185 164 173 189 198   Cardiac Enzymes: No results for input(s): CKTOTAL, CKMB, CKMBINDEX, TROPONINI in the last 168 hours. BNP: BNP (last 3 results) Recent Labs    10/26/18 1855  BNP 1,092.7*    ProBNP (last 3 results) No results for input(s): PROBNP in the last 8760 hours.  CBG: No results for input(s): GLUCAP in the last 168  hours.     Signed:  Nita Sells MD   [facility] 01/19/2019, 11:49 AM

## 2019-01-19 NOTE — Care Management (Signed)
Patient suffers from immobility which impairs their ability to perform daily activities like bathing in the home. A crutch will not resolve  issue with performing activities of daily living. A wheelchair will allow patient to safely perform daily activities. Patient is not able to propel themselves in the home using a standard weight wheelchair due to general weakness. Patient can self propel in the lightweight wheelchair. Length of need Lifetime. Accessories: elevating leg rests (ELRs), wheel locks, extensions and anti-tippers.

## 2019-01-19 NOTE — Progress Notes (Addendum)
Patient ID: Angela [name], female   DOB: 1945-10-26, 73 y.o.   MRN: [id number]   Progress Note  Patient Name: Angela [name] Date of Encounter: 01/19/2019  Primary Cardiologist: Cristopher Peru, MD   Subjective   No complaints. Denies chest pain or sob or perineal pain.  Inpatient Medications    Scheduled Meds: . acetaminophen  650 mg Oral TID  . amiodarone  400 mg Oral BID  . carvedilol  3.125 mg Oral BID WC  . cephALEXin  500 mg Oral Q12H  . magnesium oxide  800 mg Oral BID  . sodium chloride flush  3 mL Intravenous Once  . sodium chloride flush  3 mL Intravenous Q12H  . Warfarin - Pharmacist Dosing Inpatient   Does not apply q1800   Continuous Infusions: . sodium chloride    . magnesium sulfate bolus IVPB Stopped (01/17/19 1022)   PRN Meds: sodium chloride, ondansetron (ZOFRAN) IV, ondansetron **OR** ondansetron (ZOFRAN) IV, sodium chloride flush   Vital Signs    Vitals:   01/18/19 1731 01/18/19 1959 01/18/19 2335 01/19/19 0700  BP: 120/80 103/76 (!) 90/58 131/87  Pulse: 75 73 (!) 25 73  Resp: 20 17 (!) 21 (!) 21  Temp: 97.7 F (36.5 C) 98.2 F (36.8 C)  98.1 F (36.7 C)  TempSrc: Oral Oral  Oral  SpO2: 100% 99% (!) 70% 100%  Weight:    67.5 kg  Height:        Intake/Output Summary (Last 24 hours) at 01/19/2019 0935 Last data filed at 01/19/2019 0200 Gross per 24 hour  Intake 350 ml  Output 300 ml  Net 50 ml   Filed Weights   01/17/19 0525 01/18/19 0531 01/19/19 0700  Weight: 63.7 kg 67.1 kg 67.5 kg    Telemetry    Atrial fib with ventricular pacing - Personally Reviewed  ECG    none - Personally Reviewed  Physical Exam   The patient is seen and examined by Dr. Lovena Le GEN: No acute distress.   Neck: No JVD Cardiac: RRR Respiratory: no increased work of breathing. GI:  non-distended  MS: No edema; No deformity. Neuro:  Nonfocal  Psych: Normal affect   Labs    Chemistry Recent Labs  Lab 01/15/19 0344 01/16/19 0241 01/17/19 0559 01/18/19  0448 01/19/19 0405  NA 137 133* 139 135 139  K 4.3 3.5 3.7 3.7 3.6  CL 103 104 108 103 110  CO2 23 19* 19* 20* 19*  GLUCOSE 133* 120* 97 113* 103*  BUN 14 15 17 15 14   CREATININE 0.88 0.89 0.89 0.90 0.88  CALCIUM 8.9 8.4* 8.4* 8.4* 8.6*  PROT 6.4* 6.1*  --   --  6.1*  ALBUMIN 2.7* 2.5* 2.4* 2.5* 2.5*  AST 23 43*  --   --  17  ALT 16 35  --   --  27  ALKPHOS 48 50  --   --  40  BILITOT 1.2 0.7  --   --  0.5  GFRNONAA >60 >60 >60 >60 >60  GFRAA >60 >60 >60 >60 >60  ANIONGAP 11 10 12 12 10      Hematology Recent Labs  Lab 01/16/19 0241 01/17/19 0559 01/18/19 0448  WBC 8.5 10.4 5.8  RBC 3.61* 3.64* 3.75*  HGB 10.9* 10.8* 11.1*  HCT 33.9* 33.9* 35.0*  MCV 93.9 93.1 93.3  MCH 30.2 29.7 29.6  MCHC 32.2 31.9 31.7  RDW 18.7* 18.3* 18.4*  PLT 173 189 198    Cardiac EnzymesNo results for input(s):  TROPONINI in the last 168 hours. No results for input(s): TROPIPOC in the last 168 hours.   BNPNo results for input(s): BNP, PROBNP in the last 168 hours.   DDimer No results for input(s): DDIMER in the last 168 hours.   Radiology    No results found.  Cardiac Studies    10/27/2018: TTE IMPRESSIONS 1. The left ventricle has a visually estimated ejection fraction of of 15-20%. The cavity size was severely dilated. Left ventricular diastolic Doppler parameters are indeterminate due to mitral valve replacement/repair. Left ventricular diffuse  hypokinesis. 2. The right ventricle has moderately reduced systolic function. The cavity was normal. There is no increase in right ventricular wall thickness. 3. Left atrial size was severely dilated. 4. Right atrial size was severely dilated. 5. A mechanical valve is present in the mitral position. Procedure Date: 1983. 6. Bjork-Shiley mechanical mitral valve prosthesis functioning properly. 7. The tricuspid valve is normal in structure. 8. The aortic valve is normal in structure. 9. The pulmonic valve was normal in structure.  Pulmonic valve regurgitation is mild by color flow Doppler. 10. The inferior vena cava was normal in size with <50% respiratory variability.  FINDINGS Left Ventricle: The left ventricle has a visually estimated ejection fraction of of 15-20%. The cavity size was severely dilated. There is no increase in left ventricular wall thickness. Left ventricular diastolic Doppler parameters are indeterminate  due to mitral valve replacement/repair. Left ventricular diffuse hypokinesis. Right Ventricle: The right ventricle has moderately reduced systolic function. The cavity was normal. There is no increase in right ventricular wall thickness. Pacing wire/catheter visualized in the right ventricle. Left Atrium: left atrial size was severely dilated Right Atrium: right atrial size was severely dilated. Right atrial pressure is estimated at 8 mmHg. Interatrial Septum: No atrial level shunt detected by color flow Doppler. Pericardium: There is no evidence of pericardial effusion. Mitral Valve: The mitral valve has been repaired/replaced. Mitral valve regurgitation is mild by color flow Doppler. A mechanical valve is present in the mitral position. Procedure Date: 72. Bjork-Shiley mechanical mitral valve prosthesis functioning  properly. Tricuspid Valve: The tricuspid valve is normal in structure. Tricuspid valve regurgitation is mild by color flow Doppler. Aortic Valve: The aortic valve is normal in structure. Aortic valve regurgitation was not visualized by color flow Doppler. Pulmonic Valve: The pulmonic valve was normal in structure. Pulmonic valve regurgitation is mild by color flow Doppler. Venous: The inferior vena cava is normal in size with less than 50% respiratory variability.  Patient Profile     73 y.o. female with a hx of NICM, LBBB, chronic CHF (systolic), VF arrest and CHB s/p AVnode ablation for permament AFib with ICD, prior stroke, an embolic event to her iliac artery (requiring  embolectomy), and a DVT, she has VHD w prior mechanical MVR, ([id number]), admitted with AMS, UTI, and here had VT storm, again in the environment of infection, and outpt had stopped her amiodarone and coreg.  Recently: in April 2020 was hospitalized after a syncopal event while at her surgeons office, with observed ICD shocks, at that time being treated as well for perirectal infection, CT noted showed multiple foci of gas concerning for possible Fournier's gangrene.  She had at that time appropriate shocks for PMVT start on amio   Device information: SJMCRT-ICD, implanted 2009, gen change 09/05/14 Patient has an abandoned pacing device system by CXR  Assessment & Plan    1. VT       remains quieted down on amiodarone.  Has been transitioned to PO      Could be discharged today from EP perspective       Keep K+ >4.0 and Mag 2.0 or better 2. CHB - she is pacing almost 100% of the time. 3. Chronic systolic heart failure       her low bp has been limiting optimal medical therapy with an ACE/beta blocker.  4. Mechanical MVR, permanent AFib w/h/o embolic event     On warfarin     INR today is 3.0     For questions or updates, please contact [place] Please consult [email or web address] for contact info under Cardiology/STEMI.      Signed, Baldwin Jamaica, PA-C  01/19/2019, 9:35 AM    EP Attending  Patient seen and examined. She has been stable over the past 48 hours. She only had occaisional PVC's. Ok to DC on amiodarone 400 bid for 5 days then 400 mg daily. Continue low dose coreg. Unable to give an ARB or ACE due to hypotension and renal insufficiency.  Mikle Bosworth.D.

## 2019-01-22 ENCOUNTER — Other Ambulatory Visit: Payer: Self-pay

## 2019-01-22 ENCOUNTER — Emergency Department (HOSPITAL_COMMUNITY): Payer: Medicare HMO

## 2019-01-22 ENCOUNTER — Observation Stay (HOSPITAL_COMMUNITY)
Admission: EM | Admit: 2019-01-22 | Discharge: 2019-01-23 | Disposition: A | Discharge status: Home / Self Care | Payer: Medicare HMO | Attending: Family Medicine | Admitting: Family Medicine

## 2019-01-22 ENCOUNTER — Encounter (HOSPITAL_COMMUNITY): Payer: Self-pay | Admitting: Emergency Medicine

## 2019-01-22 ENCOUNTER — Other Ambulatory Visit (HOSPITAL_COMMUNITY): Payer: Self-pay

## 2019-01-22 DIAGNOSIS — Z86718 Personal history of other venous thrombosis and embolism: Secondary | ICD-10-CM | POA: Diagnosis not present

## 2019-01-22 DIAGNOSIS — Z8674 Personal history of sudden cardiac arrest: Secondary | ICD-10-CM | POA: Diagnosis not present

## 2019-01-22 DIAGNOSIS — Z8673 Personal history of transient ischemic attack (TIA), and cerebral infarction without residual deficits: Secondary | ICD-10-CM | POA: Diagnosis not present

## 2019-01-22 DIAGNOSIS — Z79899 Other long term (current) drug therapy: Secondary | ICD-10-CM | POA: Insufficient documentation

## 2019-01-22 DIAGNOSIS — I5043 Acute on chronic combined systolic (congestive) and diastolic (congestive) heart failure: Secondary | ICD-10-CM | POA: Insufficient documentation

## 2019-01-22 DIAGNOSIS — Z95810 Presence of automatic (implantable) cardiac defibrillator: Secondary | ICD-10-CM | POA: Insufficient documentation

## 2019-01-22 DIAGNOSIS — Z85038 Personal history of other malignant neoplasm of large intestine: Secondary | ICD-10-CM | POA: Insufficient documentation

## 2019-01-22 DIAGNOSIS — I495 Sick sinus syndrome: Secondary | ICD-10-CM | POA: Insufficient documentation

## 2019-01-22 DIAGNOSIS — Z9221 Personal history of antineoplastic chemotherapy: Secondary | ICD-10-CM | POA: Insufficient documentation

## 2019-01-22 DIAGNOSIS — I428 Other cardiomyopathies: Secondary | ICD-10-CM | POA: Diagnosis not present

## 2019-01-22 DIAGNOSIS — Z8249 Family history of ischemic heart disease and other diseases of the circulatory system: Secondary | ICD-10-CM | POA: Insufficient documentation

## 2019-01-22 DIAGNOSIS — Z952 Presence of prosthetic heart valve: Secondary | ICD-10-CM | POA: Insufficient documentation

## 2019-01-22 DIAGNOSIS — I482 Chronic atrial fibrillation, unspecified: Secondary | ICD-10-CM | POA: Insufficient documentation

## 2019-01-22 DIAGNOSIS — F0390 Unspecified dementia without behavioral disturbance: Secondary | ICD-10-CM | POA: Insufficient documentation

## 2019-01-22 DIAGNOSIS — Z801 Family history of malignant neoplasm of trachea, bronchus and lung: Secondary | ICD-10-CM | POA: Insufficient documentation

## 2019-01-22 DIAGNOSIS — Z20828 Contact with and (suspected) exposure to other viral communicable diseases: Secondary | ICD-10-CM | POA: Insufficient documentation

## 2019-01-22 DIAGNOSIS — I110 Hypertensive heart disease with heart failure: Secondary | ICD-10-CM | POA: Diagnosis not present

## 2019-01-22 DIAGNOSIS — I4891 Unspecified atrial fibrillation: Secondary | ICD-10-CM | POA: Diagnosis present

## 2019-01-22 DIAGNOSIS — Z7901 Long term (current) use of anticoagulants: Secondary | ICD-10-CM | POA: Diagnosis not present

## 2019-01-22 DIAGNOSIS — Z45018 Encounter for adjustment and management of other part of cardiac pacemaker: Secondary | ICD-10-CM | POA: Diagnosis not present

## 2019-01-22 LAB — CBC WITH DIFFERENTIAL/PLATELET
Abs Immature Granulocytes: 0.14 10*3/uL — ABNORMAL HIGH (ref 0.00–0.07)
Basophils Absolute: 0 10*3/uL (ref 0.0–0.1)
Basophils Relative: 1 %
Eosinophils Absolute: 0.1 10*3/uL (ref 0.0–0.5)
Eosinophils Relative: 1 %
HCT: 36.4 % (ref 36.0–46.0)
Hemoglobin: 11.3 g/dL — ABNORMAL LOW (ref 12.0–15.0)
Immature Granulocytes: 3 %
Lymphocytes Relative: 22 %
Lymphs Abs: 1.1 10*3/uL (ref 0.7–4.0)
MCH: 30.2 pg (ref 26.0–34.0)
MCHC: 31 g/dL (ref 30.0–36.0)
MCV: 97.3 fL (ref 80.0–100.0)
Monocytes Absolute: 0.4 10*3/uL (ref 0.1–1.0)
Monocytes Relative: 7 %
Neutro Abs: 3.3 10*3/uL (ref 1.7–7.7)
Neutrophils Relative %: 66 %
Platelets: 265 10*3/uL (ref 150–400)
RBC: 3.74 MIL/uL — ABNORMAL LOW (ref 3.87–5.11)
RDW: 18.8 % — ABNORMAL HIGH (ref 11.5–15.5)
WBC: 5.1 10*3/uL (ref 4.0–10.5)
nRBC: 0 % (ref 0.0–0.2)

## 2019-01-22 LAB — BASIC METABOLIC PANEL
Anion gap: 8 (ref 5–15)
BUN: 13 mg/dL (ref 8–23)
CO2: 19 mmol/L — ABNORMAL LOW (ref 22–32)
Calcium: 8.8 mg/dL — ABNORMAL LOW (ref 8.9–10.3)
Chloride: 110 mmol/L (ref 98–111)
Creatinine, Ser: 0.83 mg/dL (ref 0.44–1.00)
GFR calc Af Amer: >60 mL/min (ref >60)
GFR calc non Af Amer: >60 mL/min (ref >60)
Glucose, Bld: 111 mg/dL — ABNORMAL HIGH (ref 70–99)
Potassium: 4.3 mmol/L (ref 3.5–5.1)
Sodium: 137 mmol/L (ref 135–145)

## 2019-01-22 LAB — PROTIME-INR
INR: 2.8 — ABNORMAL HIGH (ref 0.8–1.2)
Prothrombin Time: 28.9 seconds — ABNORMAL HIGH (ref 11.4–15.2)

## 2019-01-22 LAB — CBG MONITORING, ED: Glucose-Capillary: 95 mg/dL (ref 70–99)

## 2019-01-22 LAB — BRAIN NATRIURETIC PEPTIDE: B Natriuretic Peptide: 2385.4 pg/mL — ABNORMAL HIGH (ref 0.0–100.0)

## 2019-01-22 LAB — SARS CORONAVIRUS 2 BY RT PCR (HOSPITAL ORDER, PERFORMED IN ~~LOC~~ HOSPITAL LAB): SARS Coronavirus 2: NEGATIVE

## 2019-01-22 LAB — TROPONIN I: Troponin I: 0.21 ng/mL (ref <0.03)

## 2019-01-22 LAB — TSH: TSH: 2.338 u[IU]/mL (ref 0.350–4.500)

## 2019-01-22 MED ORDER — AMIODARONE HCL 200 MG PO TABS
400.0000 mg | ORAL_TABLET | Freq: Two times a day (BID) | ORAL | Status: DC
  Administered 2019-01-22 – 2019-01-23 (×2): 400 mg via ORAL
  Filled 2019-01-22 (×2): qty 2

## 2019-01-22 MED ORDER — SODIUM CHLORIDE 0.9 % IV SOLN: 250.0000 mL | INTRAVENOUS | Status: DC | PRN

## 2019-01-22 MED ORDER — POTASSIUM CHLORIDE CRYS ER 20 MEQ PO TBCR
20.0000 meq | EXTENDED_RELEASE_TABLET | Freq: Every day | ORAL | Status: DC
  Administered 2019-01-22 – 2019-01-23 (×2): 20 meq via ORAL
  Filled 2019-01-22 (×2): qty 1

## 2019-01-22 MED ORDER — SODIUM CHLORIDE 0.9% FLUSH: 3.0000 mL | INTRAVENOUS | Status: DC | PRN

## 2019-01-22 MED ORDER — FUROSEMIDE 10 MG/ML IJ SOLN
40.0000 mg | Freq: Once | INTRAMUSCULAR | Status: AC
  Administered 2019-01-22: 40 mg via INTRAVENOUS
  Filled 2019-01-22: qty 4

## 2019-01-22 MED ORDER — CARVEDILOL 3.125 MG PO TABS
3.1250 mg | ORAL_TABLET | Freq: Two times a day (BID) | ORAL | Status: DC
  Administered 2019-01-22 – 2019-01-23 (×2): 3.125 mg via ORAL
  Filled 2019-01-22 (×2): qty 1

## 2019-01-22 MED ORDER — ONDANSETRON HCL 4 MG/2ML IJ SOLN: 4.0000 mg | Freq: Four times a day (QID) | INTRAMUSCULAR | Status: DC | PRN

## 2019-01-22 MED ORDER — WARFARIN - PHARMACIST DOSING INPATIENT: Freq: Every day | Status: DC

## 2019-01-22 MED ORDER — SODIUM CHLORIDE 0.9% FLUSH
3.0000 mL | Freq: Two times a day (BID) | INTRAVENOUS | Status: DC
  Administered 2019-01-22: 3 mL via INTRAVENOUS

## 2019-01-22 MED ORDER — ACETAMINOPHEN 325 MG PO TABS: 650.0000 mg | ORAL_TABLET | ORAL | Status: DC | PRN

## 2019-01-22 MED ORDER — WARFARIN SODIUM 5 MG PO TABS
5.0000 mg | ORAL_TABLET | Freq: Once | ORAL | Status: AC
  Administered 2019-01-22: 5 mg via ORAL
  Filled 2019-01-22: qty 1

## 2019-01-22 MED ORDER — FUROSEMIDE 10 MG/ML IJ SOLN
40.0000 mg | Freq: Two times a day (BID) | INTRAMUSCULAR | Status: DC
  Administered 2019-01-22 – 2019-01-23 (×2): 40 mg via INTRAVENOUS
  Filled 2019-01-22 (×2): qty 4

## 2019-01-22 NOTE — ED Triage Notes (Signed)
Pt from home sent to ED BY HUSBAND FOR SOB Onset  10:30 last PM woke from sleep d/t SOB lung sounds diminished bilat lower lung frields denies fever, N/v/D. Hx. Dementia. Paced rhythm.

## 2019-01-22 NOTE — ED Notes (Signed)
Patient complaining of SOB with exertion. Patient has tachypnea when asked to roll for bedpan, but SpO2 is 100% on RA. Will continue to monitor patient.

## 2019-01-22 NOTE — ED Notes (Signed)
This nurse and night shift RN in pt room to change brief and linen d/t urine incontinent episode. purewick placed to low suction to prevent moisture and skin breakdown.

## 2019-01-22 NOTE — ED Provider Notes (Addendum)
[facility] DEPT Provider Note  CSN: [id number] Arrival date & time: 01/22/19 9563  Chief Complaint(s) Shortness of Breath  HPI Angela [name] is a 73 y.o. female with a history of atrial fibrillation on warfarin, CHF with a last EF of 15 to 20% in March 2020 with a ICD in place who presents to the emergency department with sudden onset shortness of breath with associated chest pain which lasted approximately 10 minutes.  Patient also reported that she had rapid heart rate at that time.  She reports that symptoms have gradually improved.  Still endorses some mild dyspnea.  Chest pain has resolved.  Patient endorsed feeling like she was going to pass out at that time.  Patient denies any known fevers, coughing or congestion.  No nausea vomiting.  No abdominal pain.  No peripheral edema.  Denies any other physical complaints.   HPI  Past Medical History Past Medical History:  Diagnosis Date  . Abnormality of gait 08/23/2014  . Arthritis   . Atrial fibrillation ([place])    A.  Chronic Coumadin  . Cardiac arrest - ventricular fibrillation    A.  12/2007;  B. 01/2008 St. Jude Promote Bi-V ICD placed  . CVA (cerebral vascular accident) ([place])   . Diverticular disease   . DVT (deep venous thrombosis) ([place])   . 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 History of colon cancer    A.  1999 - T3, N1  chemotherapy  . Internal hemorrhoids   . Memory deficit 08/23/2014  . Nonischemic cardiomyopathy ([place])    a.  11/2001 - Cath - NL Cors; b. ECHO 09/22/11: EF 20%, MVR normal, moderate LAE, mild RAE c. ECHO (08/2012): ED 20%, diff HK, LA mod dilated, mild/mod TR, RV mild/mod decreased sys fx  . Rectal bleeding 04/28/2017  . Rheumatic heart disease    A. 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 Sick sinus syndrome ([place])    A.  s/p pacer in 1995.  B.    . Systolic CHF, chronic ([place])    A.  01/2008 Echo - EF 10-20%  . UTI  (urinary tract infection) 01/19/2019   Patient Active Problem List   Diagnosis Date Noted  . CHF (congestive heart failure) ([place]) 01/22/2019  . Perianal pain   . Acute metabolic encephalopathy 87/56/4332  . UTI (urinary tract infection) 01/14/2019  . Protein-calorie malnutrition, severe 12/08/2018  . Goals of care, counseling/discussion   . 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 Syncope 11/26/2018  . Ventricular tachycardia ([place]) 11/26/2018  . Elevated INR 11/26/2018  . Acute on chronic systolic (congestive) heart failure ([place]) 10/27/2018  . Elevated troponin 10/26/2018  . Anemia 10/26/2018  . Palliative care by specialist   . Rectal bleeding 04/28/2017  . Acute blood loss anemia 04/28/2017  . ICD (implantable cardioverter-defibrillator), biventricular, in situ 08/24/2014  . Memory deficit 08/23/2014  . Abnormality of gait 08/23/2014  . Dementia ([place]) 12/17/2013  . 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 Hypotension due to drugs 07/12/2012  . Hypokalemia 07/12/2012  . NSVT (nonsustained ventricular tachycardia) ([place]) 07/12/2012  . Acute on chronic systolic heart failure ([place]) 07/07/2012  . Ventricular fibrillation ([place]) 11/11/2011  . Warfarin anticoagulation 09/22/2011  . Nonischemic cardiomyopathy ([place])   . Atrial fibrillation ([place])   . History of colon cancer   . Internal hemorrhoids   . Rheumatic heart disease   . Sick  sinus syndrome ([place])   . Systolic CHF, chronic ([place])   . DIVERTICULAR DISEASE 06/05/2009   Home Medication(s) Prior to Admission medications   Medication Sig Start Date End Date Taking? Authorizing Provider  amiodarone (PACERONE) 200 MG tablet Take 2 tablets (400 mg total) by mouth daily for 5 days, THEN 2 tablets (400 mg total) daily for 30 days. 01/19/19 02/23/19 Yes Nita Sells, MD  carvedilol (COREG) 3.125 MG tablet Take 1 tablet (3.125 mg total) by mouth 2 (two) times daily with a meal. 03/18/18  Yes Evans Lance, MD   cephALEXin (KEFLEX) 500 MG capsule Take 1 capsule (500 mg total) by mouth every 12 (twelve) hours for 5 days. 01/19/19 01/24/19 Yes Nita Sells, MD  warfarin (COUMADIN) 1 MG tablet Take 5 mg by mouth daily.   Yes [provider]                                                                                                                                    Past Surgical History Past Surgical History:  Procedure Laterality Date  . ABDOMINAL HYSTERECTOMY    . AV NODE ABLATION N/A 09/23/2011   Procedure: AV NODE ABLATION;  Surgeon: Evans Lance, MD;  Location: [facility] CATH LAB;  Service: Cardiovascular;  Laterality: N/A;  . BIV ICD GENERTAOR CHANGE OUT N/A 09/05/2014   Procedure: BIV ICD GENERTAOR CHANGE OUT;  Surgeon: Evans Lance, MD;  Location: [facility] CATH LAB;  Service: Cardiovascular;  Laterality: N/A;  . Biventricular AICD    . COLON SURGERY  1999  . History of echocardiogram  2003, 2007, 2009  . INCISION AND DRAINAGE PERIRECTAL ABSCESS Right 11/28/2018   Procedure: IRRIGATION AND DEBRIDEMENT PERIRECTAL ABSCESS;  Surgeon: Greer Pickerel, MD;  Location: [facility];  Service: General;  Laterality: Right;  . Mitral Valve Replacement, Bjork-Shiley valve  1983   Family History Family History  Problem Relation Age of Onset  . Diabetes Mother 41  . Coronary artery disease Mother        s/p cabg  . Lung cancer Father 42       smoker  . Coronary artery disease Sister        s/p cabg    Social History Social History   Tobacco Use  . Smoking status: Never Smoker  . Smokeless tobacco: Never Used  Substance Use Topics  . Alcohol use: No  . Drug use: No   Allergies Patient has no known allergies.  Review of Systems Review of Systems All other systems are reviewed and are negative for acute change except as noted in the HPI  Physical Exam Vital Signs  I have reviewed the triage vital signs BP 135/90 (BP Location: Right Arm)   Pulse 73   Temp 98.4 F (36.9 C) (Oral)   Resp  18   Ht 5\' 6"  (1.676 m)   Wt 63.5 kg   SpO2 100%   BMI  22.60 kg/m   Physical Exam Vitals signs reviewed.  Constitutional:      General: She is not in acute distress.    Appearance: She is well-developed. She is not diaphoretic.  HENT:     Head: Normocephalic and atraumatic.     Nose: Nose normal.  Eyes:     General: No scleral icterus.       Right eye: No discharge.        Left eye: No discharge.     Conjunctiva/sclera: Conjunctivae normal.     Pupils: Pupils are equal, round, and reactive to light.  Neck:     Musculoskeletal: Normal range of motion and neck supple.  Cardiovascular:     Rate and Rhythm: Normal rate and regular rhythm.     Heart sounds: No murmur. No friction rub. No gallop.   Pulmonary:     Effort: Pulmonary effort is normal. Tachypnea present. No respiratory distress.     Breath sounds: No stridor. Examination of the right-lower field reveals rales. Examination of the left-lower field reveals rales. Rales present.  Abdominal:     General: There is no distension.     Palpations: Abdomen is soft.     Tenderness: There is no abdominal tenderness.  Musculoskeletal:        General: No tenderness.     Right lower leg: No edema.     Left lower leg: No edema.  Skin:    General: Skin is warm and dry.     Findings: No erythema or rash.  Neurological:     Mental Status: She is alert and oriented to person, place, and time.     ED Results and Treatments Labs (all labs ordered are listed, but only abnormal results are displayed) Labs Reviewed  BASIC METABOLIC PANEL - Abnormal; Notable for the following components:      Result Value   CO2 19 (*)    Glucose, Bld 111 (*)    Calcium 8.8 (*)    All other components within normal limits  CBC WITH DIFFERENTIAL/PLATELET - Abnormal; Notable for the following components:   RBC 3.74 (*)    Hemoglobin 11.3 (*)    RDW 18.8 (*)    Abs Immature Granulocytes 0.14 (*)    All other components within normal limits   PROTIME-INR - Abnormal; Notable for the following components:   Prothrombin Time 28.9 (*)    INR 2.8 (*)    All other components within normal limits  BRAIN NATRIURETIC PEPTIDE - Abnormal; Notable for the following components:   B Natriuretic Peptide 2,385.4 (*)    All other components within normal limits  TROPONIN I - Abnormal; Notable for the following components:   Troponin I 0.21 (*)    All other components within normal limits  SARS CORONAVIRUS 2 (HOSPITAL ORDER, [facility] LAB)  CBG MONITORING, ED  EKG not crossed into MUSE.     Radiology Dg Chest Port 1 View  Result Date: 01/22/2019 CLINICAL DATA:  Shortness of breath EXAM: PORTABLE CHEST 1 VIEW COMPARISON:  Eight days ago FINDINGS: Cardiomegaly. There is new interstitial opacity with small pleural effusions. Biventricular pacer with leads from both left and right in stable position. Mitral valve replacement. IMPRESSION: CHF pattern. Electronically Signed   By: Monte Fantasia M.D.   On: 01/22/2019 05:03   Pertinent labs & imaging results that were available during my care of the patient were reviewed by me and considered in my medical decision making (see chart for details).  Medications Ordered in ED Medications  furosemide (LASIX) injection 40 mg (40 mg Intravenous Given 01/22/19 0658)                                                                                                                                    Procedures .Critical Care Performed by: Fatima Blank, MD Authorized by: Fatima Blank, MD     CRITICAL CARE Performed by: Grayce Sessions Cardama Total critical care time: 35 minutes Critical care time was exclusive of separately billable procedures and treating other patients. Critical care was necessary to treat or prevent imminent or  life-threatening deterioration. Critical care was time spent personally by me on the following activities: development of treatment plan with patient and/or surrogate as well as nursing, discussions with consultants, evaluation of patient's response to treatment, examination of patient, obtaining history from patient or surrogate, ordering and performing treatments and interventions, ordering and review of laboratory studies, ordering and review of radiographic studies, pulse oximetry and re-evaluation of patient's condition.   (including critical care time)  Medical Decision Making / ED Course I have reviewed the nursing notes for this encounter and the patient's prior records (if available in EHR or on provided paperwork).    Patient presents with sudden onset chest pain or shortness of breath with tachycardia and near syncopal episode.  EKG with ventricularly paced rhythm.  Patient known to have recurrent nonsustained VT.  Will interrogate her device.  Patient is currently afebrile with stable vital signs.  She appears comfortable while in bed.  Noted to be extremely short of breath upon standing.  Placed on 2 L nasal cannula for comfort.  Will obtain orthostatics if possible.    On lung auscultation patient has bibasilar rales.  Chest x-ray notable for worsening pulmonary edema.  Work-up consistent with CHF exacerbation.  Troponin elevated at 0.21 (down from prior), BNP greater than 2000, up from prior.  INR is therapeutic.  No renal insufficiency.  Will provide patient with Lasix.  We will obtain cover testing given need for admission.  Still pending interrogation.  Will call for admission.  Final Clinical Impression(s) / ED Diagnoses Final diagnoses:  Acute on chronic combined systolic and diastolic congestive heart failure ([place])      This chart was dictated  using voice recognition software.  Despite best efforts to proofread,  errors can occur which can change the  documentation meaning.     Fatima Blank, MD 01/22/19 [phone]

## 2019-01-22 NOTE — ED Notes (Signed)
[employer] Jude called about pacemaker interrogation, states that patient rhythm has been normal, but collecting fluid for the past 10 days. Patient had a previous episode in April, but appears to have recovered from that with the exception of the past 10 days. Also stated that patient had 5 episodes where she was shocked for VF on May 21st.

## 2019-01-22 NOTE — ED Notes (Signed)
ED TO INPATIENT HANDOFF REPORT  Name/Age/Gender Angela [name] 73 y.o. female  Code Status Code Status History    Date Active Date Inactive Code Status Order ID Comments User Context   01/15/2019 0017 01/19/2019 2316 Full Code [id number]  Elwyn Reach, MD Inpatient   11/26/2018 2248 12/09/2018 0038 Full Code [id number]  Lenore Cordia, MD ED   10/26/2018 2308 10/28/2018 2215 Full Code [id number]  Reubin Milan, MD Inpatient   04/28/2017 0449 05/08/2017 1902 Full Code [id number]  Rise Patience, MD ED   09/05/2014 1524 09/05/2014 1957 Full Code [id number]  Evans Lance, MD Inpatient   09/21/2011 0929 09/23/2011 1414 Full Code [id number]  Rise Patience, RN Inpatient      Home/SNF/Other Home  Chief Complaint SOB  Level of Care/Admitting Diagnosis ED Disposition    ED Disposition Condition [place]: [facility] [[id number]]  Level of Care: Telemetry [5]  Admit to tele based on following criteria: Acute CHF  Covid Evaluation: Confirmed COVID Negative  Diagnosis: CHF (congestive heart failure) [facility]) [144818]  Admitting Physician: Darliss Cheney [[id number]]  Attending Physician: Darliss Cheney [[id number]]  PT Class (Do Not Modify): Observation [104]  PT Acc Code (Do Not Modify): Observation [10022]       Medical History Past Medical History:  Diagnosis Date  . Abnormality of gait 08/23/2014  . Arthritis   . Atrial fibrillation ([place])    A.  Chronic Coumadin  . Cardiac arrest - ventricular fibrillation    A.  12/2007;  B. 01/2008 St. Jude Promote Bi-V ICD placed  . CVA (cerebral vascular accident) ([place])   . Diverticular disease   . DVT (deep venous thrombosis) ([place])   . 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 History of colon cancer    A.  1999 - T3, N1  chemotherapy  . Internal hemorrhoids   . Memory deficit 08/23/2014  . Nonischemic cardiomyopathy ([place])    a.  11/2001 -  Cath - NL Cors; b. ECHO 09/22/11: EF 20%, MVR normal, moderate LAE, mild RAE c. ECHO (08/2012): ED 20%, diff HK, LA mod dilated, mild/mod TR, RV mild/mod decreased sys fx  . Rectal bleeding 04/28/2017  . Rheumatic heart disease    A. 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 Sick sinus syndrome ([place])    A.  s/p pacer in 1995.  B.    . Systolic CHF, chronic ([place])    A.  01/2008 Echo - EF 10-20%  . UTI (urinary tract infection) 01/19/2019    Allergies No Known Allergies  IV Location/Drains/Wounds Patient Lines/Drains/Airways Status   Active Line/Drains/Airways    Name:   Placement date:   Placement time:   Site:   Days:   Peripheral IV 01/22/19 Right Wrist   01/22/19    0453    Wrist   less than 1   Peripheral IV 01/22/19 Left Wrist   01/22/19    0355    Wrist   less than 1   Incision (Closed) 11/28/18 Buttocks Other (Comment)   11/28/18    1115     55   Wound / Incision (Open or Dehisced) 01/15/19 Other (Comment) Buttocks surgical wound   01/15/19    0015    Buttocks   7          Labs/Imaging Results for orders placed or performed during the hospital encounter of  01/22/19 (from the past 48 hour(s))  Basic metabolic panel     Status: Abnormal   Collection Time: 01/22/19  4:52 AM  Result Value Ref Range   Sodium 137 135 - 145 mmol/L   Potassium 4.3 3.5 - 5.1 mmol/L   Chloride 110 98 - 111 mmol/L   CO2 19 (L) 22 - 32 mmol/L   Glucose, Bld 111 (H) 70 - 99 mg/dL   BUN 13 8 - 23 mg/dL   Creatinine, Ser 0.83 0.44 - 1.00 mg/dL   Calcium 8.8 (L) 8.9 - 10.3 mg/dL   GFR calc non Af Amer >60 >60 mL/min   GFR calc Af Amer >60 >60 mL/min   Anion gap 8 5 - 15    Comment: Performed at [facility], [place] [address] [place], [place] [address]  CBC WITH DIFFERENTIAL     Status: Abnormal   Collection Time: 01/22/19  4:52 AM  Result Value Ref Range   WBC 5.1 4.0 - 10.5 K/uL   RBC 3.74 (L) 3.87 - 5.11 MIL/uL   Hemoglobin 11.3 (L) 12.0 - 15.0 g/dL   HCT 36.4 36.0 - 46.0 %   MCV 97.3  80.0 - 100.0 fL   MCH 30.2 26.0 - 34.0 pg   MCHC 31.0 30.0 - 36.0 g/dL   RDW 18.8 (H) 11.5 - 15.5 %   Platelets 265 150 - 400 K/uL   nRBC 0.0 0.0 - 0.2 %   Neutrophils Relative % 66 %   Neutro Abs 3.3 1.7 - 7.7 K/uL   Lymphocytes Relative 22 %   Lymphs Abs 1.1 0.7 - 4.0 K/uL   Monocytes Relative 7 %   Monocytes Absolute 0.4 0.1 - 1.0 K/uL   Eosinophils Relative 1 %   Eosinophils Absolute 0.1 0.0 - 0.5 K/uL   Basophils Relative 1 %   Basophils Absolute 0.0 0.0 - 0.1 K/uL   Immature Granulocytes 3 %   Abs Immature Granulocytes 0.14 (H) 0.00 - 0.07 K/uL    Comment: Performed at [facility], [place] [address], [place], [place] [address]  Protime-INR     Status: Abnormal   Collection Time: 01/22/19  4:52 AM  Result Value Ref Range   Prothrombin Time 28.9 (H) 11.4 - 15.2 seconds   INR 2.8 (H) 0.8 - 1.2    Comment: (NOTE) INR goal varies based on device and disease states. Performed at [facility], [place] [address], [place], [place] [address]   Brain natriuretic peptide     Status: Abnormal   Collection Time: 01/22/19  4:52 AM  Result Value Ref Range   B Natriuretic Peptide 2,385.4 (H) 0.0 - 100.0 pg/mL    Comment: Performed at [facility], [place] [address], [place], [place] [address]  Troponin I - ONCE - STAT     Status: Abnormal   Collection Time: 01/22/19  4:52 AM  Result Value Ref Range   Troponin I 0.21 (HH) <0.03 ng/mL    Comment: CRITICAL RESULT CALLED TO, READ BACK BY AND VERIFIED WITH: Jordan Hawks RN [id number] 01/22/19 A NAVARRO Performed at [facility], [place] [address] [place], [place] [address]   CBG monitoring, ED     Status: None   Collection Time: 01/22/19  6:26 AM  Result Value Ref Range   Glucose-Capillary 95 70 - 99 mg/dL  SARS Coronavirus 2 (CEPHEID- Performed in [place] hospital lab), Hosp Order     Status: None   Collection Time:  01/22/19  6:36 AM  Result Value Ref Range   SARS  Coronavirus 2 NEGATIVE NEGATIVE    Comment: (NOTE) If result is NEGATIVE SARS-CoV-2 target nucleic acids are NOT DETECTED. The SARS-CoV-2 RNA is generally detectable in upper and lower  respiratory specimens during the acute phase of infection. The lowest  concentration of SARS-CoV-2 viral copies this assay can detect is 250  copies / mL. A negative result does not preclude SARS-CoV-2 infection  and should not be used as the sole basis for treatment or other  patient management decisions.  A negative result may occur with  improper specimen collection / handling, submission of specimen other  than nasopharyngeal swab, presence of viral mutation(s) within the  areas targeted by this assay, and inadequate number of viral copies  (<250 copies / mL). A negative result must be combined with clinical  observations, patient history, and epidemiological information. If result is POSITIVE SARS-CoV-2 target nucleic acids are DETECTED. The SARS-CoV-2 RNA is generally detectable in upper and lower  respiratory specimens dur ing the acute phase of infection.  Positive  results are indicative of active infection with SARS-CoV-2.  Clinical  correlation with patient history and other diagnostic information is  necessary to determine patient infection status.  Positive results do  not rule out bacterial infection or co-infection with other viruses. If result is PRESUMPTIVE POSTIVE SARS-CoV-2 nucleic acids MAY BE PRESENT.   A presumptive positive result was obtained on the submitted specimen  and confirmed on repeat testing.  While 2019 novel coronavirus  (SARS-CoV-2) nucleic acids may be present in the submitted sample  additional confirmatory testing may be necessary for epidemiological  and / or clinical management purposes  to differentiate between  SARS-CoV-2 and other Sarbecovirus currently known to infect humans.  If clinically indicated additional testing with an alternate test  methodology  [phone]) is advised. The SARS-CoV-2 RNA is generally  detectable in upper and lower respiratory sp ecimens during the acute  phase of infection. The expected result is Negative. Fact Sheet for Patients:  StrictlyIdeas.no Fact Sheet for Healthcare Providers: BankingDealers.co.za This test is not yet approved or cleared by the [place] FDA and has been authorized for detection and/or diagnosis of SARS-CoV-2 by FDA under an Emergency Use Authorization (EUA).  This EUA will remain in effect (meaning this test can be used) for the duration of the COVID-19 declaration under Section 564(b)(1) of the Act, 21 U.S.C. section 360bbb-3(b)(1), unless the authorization is terminated or revoked sooner. Performed at [facility], [place] [address], [place], [place] [address]    Dg Chest Port 1 View  Result Date: 01/22/2019 CLINICAL DATA:  Shortness of breath EXAM: PORTABLE CHEST 1 VIEW COMPARISON:  Eight days ago FINDINGS: Cardiomegaly. There is new interstitial opacity with small pleural effusions. Biventricular pacer with leads from both left and right in stable position. Mitral valve replacement. IMPRESSION: CHF pattern. Electronically Signed   By: Monte Fantasia M.D.   On: 01/22/2019 05:03    Pending Labs [employer] (From admission, onward)    Start     Ordered   Signed and Corporate treasurer  Daily,   R     Signed and Held   Signed and Held  TSH  Once,   R     Signed and Held          Vitals/Pain Today's Vitals   01/22/19 1211 01/22/19 1230 01/22/19 1249 01/22/19 1330  BP: (!) 160/135 (!) 145/92 [name])  155/91 115/73  Pulse: 72  74 70  Resp:  (!) 29 20 18   Temp:      TempSrc:      SpO2:   98% 100%  Weight:      Height:      PainSc:        Isolation Precautions Droplet and Contact precautions  Medications Medications  furosemide (LASIX) injection 40 mg (40 mg Intravenous Given 01/22/19 0658)     Mobility non-ambulatory

## 2019-01-22 NOTE — Progress Notes (Signed)
Pt active with [facility] for RN, PT, OT, social work, and Engineer, production.  Sharren Bridge, MSW, LCSW Transitions of Care 01/22/2019

## 2019-01-22 NOTE — ED Notes (Signed)
Pacemaker interrogated. Awaiting results.

## 2019-01-22 NOTE — H&P (Addendum)
History and Physical    Angela [name] SHF:[id number] DOB: [birth year] DOA: 01/22/2019  PCP: Lorene Dy, MD  Patient coming from: Home  I have personally briefly reviewed patient's old medical records in [place]  Chief Complaint: Shortness of breath  HPI: Angela [name] is a 73 y.o. female with medical history significant of dementia, atrial fibrillation on Coumadin, nonischemic cardiomyopathy, nonsustained V. tach with AICD implanted, history of DVT, hypertension Who was discharged from the hospital just 3 days ago after staying here for 5 days for acute metabolic encephalopathy secondary to UTI return to the emergency department with a complaint of shortness of breath and chest pain.  Her symptoms started sometime last evening.  The pain lasted only for 10 minutes.  She is unable to describe the character of the pain.  Upon arrival to the ER, she was pain-free but had shortness of breath.  Patient denies any fever, chills, sweating, any sick contact.  ED Course: Upon arrival to the ED, her blood pressure was 115/86, temperature 98.4, heart rate 70 and respiratory rate 14.  She was slightly hypoxic requiring some oxygen.  Chest x-ray was done which showed pulmonary edema.  BNP was significantly elevated up to 2385 and also had slightly elevated troponin.  She was tested negative for COVID-19.  Patient received IV Lasix in the emergency department and was admitted to hospital service.  Of note, patient has history of recurrent hospitalizations due to congestive heart failure and for that reason, she was seen by palliative care however her husband and she decided to continue aggressive care and remain full code.  They were advised to follow-up with palliative care as an outpatient.  Review of Systems: As per HPI otherwise 10 point review of systems negative.    Past Medical History:  Diagnosis Date  . Abnormality of gait 08/23/2014  . Arthritis   . Atrial fibrillation ([place])    A.   Chronic Coumadin  . Cardiac arrest - ventricular fibrillation    A.  12/2007;  B. 01/2008 St. Jude Promote Bi-V ICD placed  . CVA (cerebral vascular accident) ([place])   . Diverticular disease   . DVT (deep venous thrombosis) ([place])   . 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 History of colon cancer    A.  1999 - T3, N1  chemotherapy  . Internal hemorrhoids   . Memory deficit 08/23/2014  . Nonischemic cardiomyopathy ([place])    a.  11/2001 - Cath - NL Cors; b. ECHO 09/22/11: EF 20%, MVR normal, moderate LAE, mild RAE c. ECHO (08/2012): ED 20%, diff HK, LA mod dilated, mild/mod TR, RV mild/mod decreased sys fx  . Rectal bleeding 04/28/2017  . Rheumatic heart disease    A. 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 Sick sinus syndrome ([place])    A.  s/p pacer in 1995.  B.    . Systolic CHF, chronic ([place])    A.  01/2008 Echo - EF 10-20%  . UTI (urinary tract infection) 01/19/2019    Past Surgical History:  Procedure Laterality Date  . ABDOMINAL HYSTERECTOMY    . AV NODE ABLATION N/A 09/23/2011   Procedure: AV NODE ABLATION;  Surgeon: Evans Lance, MD;  [facility] C[facility] LAB;  Service: Cardiovascular;  Laterality: N/A;  . BIV ICD GENERTAOR CHANGE OUT N/A 09/05/2014   Procedure: BIV ICD GENERTAOR CHANGE OUT;  Surgeon: Evans Lance, MD;  Location:  [facility] CATH LAB;  Service: Cardiovascular;  Laterality: N/A;  . Biventricular AICD    . COLON SURGERY  1999  . History of echocardiogram  2003, 2007, 2009  . INCISION AND DRAINAGE PERIRECTAL ABSCESS Right 11/28/2018   Procedure: IRRIGATION AND DEBRIDEMENT PERIRECTAL ABSCESS;  Surgeon: Greer Pickerel, MD;  Location: [facility];  Service: General;  Laterality: Right;  . Mitral Valve Replacement, Bjork-Shiley valve  1983     reports that she has never smoked. She has never used smokeless tobacco. She reports that she does not drink alcohol or use drugs.  No Known Allergies  Family History  Problem Relation Age of  Onset  . Diabetes Mother 89  . Coronary artery disease Mother        s/p cabg  . Lung cancer Father 75       smoker  . Coronary artery disease Sister        s/p cabg    Prior to Admission medications   Medication Sig Start Date End Date Taking? Authorizing Provider  amiodarone (PACERONE) 200 MG tablet Take 2 tablets (400 mg total) by mouth daily for 5 days, THEN 2 tablets (400 mg total) daily for 30 days. 01/19/19 02/23/19 Yes Nita Sells, MD  carvedilol (COREG) 3.125 MG tablet Take 1 tablet (3.125 mg total) by mouth 2 (two) times daily with a meal. 03/18/18  Yes Evans Lance, MD  cephALEXin (KEFLEX) 500 MG capsule Take 1 capsule (500 mg total) by mouth every 12 (twelve) hours for 5 days. 01/19/19 01/24/19 Yes Nita Sells, MD  warfarin (COUMADIN) 1 MG tablet Take 5 mg by mouth daily.   Yes [provider]    Physical Exam: Vitals:   01/22/19 1230 01/22/19 1249 01/22/19 1330 01/22/19 1437  BP: (!) 145/92 (!) 155/91 115/73 130/84  Pulse:  74 70 69  Resp: (!) 29 20 18    Temp:    97.7 F (36.5 C)  TempSrc:    Oral  SpO2:  98% 100% 96%  Weight:    65.1 kg  Height:    5\' 6"  (1.676 m)    Constitutional: NAD, calm, comfortable Vitals:   01/22/19 1230 01/22/19 1249 01/22/19 1330 01/22/19 1437  BP: (!) 145/92 (!) 155/91 115/73 130/84  Pulse:  74 70 69  Resp: (!) 29 20 18    Temp:    97.7 F (36.5 C)  TempSrc:    Oral  SpO2:  98% 100% 96%  Weight:    65.1 kg  Height:    5\' 6"  (1.676 m)   Eyes: PERRL, lids and conjunctivae normal ENMT: Mucous membranes are moist. Posterior pharynx clear of any exudate or lesions.Normal dentition.  Neck: normal, supple, no masses, no thyromegaly Respiratory: Crackles bilaterally, no wheezing, no crackles. Normal respiratory effort. No accessory muscle use.  Cardiovascular: Irregularly irregular rate and rhythm, no murmurs / rubs / gallops. No extremity edema. 2+ pedal pulses. No carotid bruits.  Abdomen: no tenderness, no  masses palpated. No hepatosplenomegaly. Bowel sounds positive.  Musculoskeletal: no clubbing / cyanosis. No joint deformity upper and lower extremities. Good ROM, no contractures. Normal muscle tone.  Skin: no rashes, lesions, ulcers. No induration Neurologic: CN 2-12 grossly intact. Sensation intact, DTR normal. Strength 5/5 in all 4.  Psychiatric: Poor judgment and insight. Alert and oriented x 3. Normal mood.    Labs on Admission: I have personally reviewed following labs and imaging studies  CBC: Recent Labs  Lab 01/16/19 0241 01/17/19 0559 01/18/19 0448 01/22/19 0452  WBC  8.5 10.4 5.8 5.1  NEUTROABS 6.3 8.0* 3.8 3.3  HGB 10.9* 10.8* 11.1* 11.3*  HCT 33.9* 33.9* 35.0* 36.4  MCV 93.9 93.1 93.3 97.3  PLT 173 189 198 413   Basic Metabolic Panel: Recent Labs  Lab 01/16/19 0241 01/17/19 0559 01/18/19 0448 01/19/19 0405 01/22/19 0452  NA 133* 139 135 139 137  K 3.5 3.7 3.7 3.6 4.3  CL 104 108 103 110 110  CO2 19* 19* 20* 19* 19*  GLUCOSE 120* 97 113* 103* 111*  BUN 15 17 15 14 13   CREATININE 0.89 0.89 0.90 0.88 0.83  CALCIUM 8.4* 8.4* 8.4* 8.6* 8.8*  MG 1.8  --   --  2.0  --   PHOS  --  3.2 2.8  --   --    GFR: Estimated Creatinine Clearance: 55.7 mL/min (by C-G formula based on SCr of 0.83 mg/dL). Liver Function Tests: Recent Labs  Lab 01/16/19 0241 01/17/19 0559 01/18/19 0448 01/19/19 0405  AST 43*  --   --  17  ALT 35  --   --  27  ALKPHOS 50  --   --  40  BILITOT 0.7  --   --  0.5  PROT 6.1*  --   --  6.1*  ALBUMIN 2.5* 2.4* 2.5* 2.5*   No results for input(s): LIPASE, AMYLASE in the last 168 hours. No results for input(s): AMMONIA in the last 168 hours. Coagulation Profile: Recent Labs  Lab 01/16/19 0241 01/17/19 0559 01/18/19 0448 01/19/19 0405 01/22/19 0452  INR 1.6* 1.9* 2.6* 3.0* 2.8*   Cardiac Enzymes: Recent Labs  Lab 01/22/19 0452  TROPONINI 0.21*   BNP (last 3 results) No results for input(s): PROBNP in the last 8760  hours. HbA1C: No results for input(s): HGBA1C in the last 72 hours. CBG: Recent Labs  Lab 01/22/19 0626  GLUCAP 95   Lipid Profile: No results for input(s): CHOL, HDL, LDLCALC, TRIG, CHOLHDL, LDLDIRECT in the last 72 hours. Thyroid Function Tests: No results for input(s): TSH, T4TOTAL, FREET4, T3FREE, THYROIDAB in the last 72 hours. Anemia Panel: No results for input(s): VITAMINB12, FOLATE, FERRITIN, TIBC, IRON, RETICCTPCT in the last 72 hours. Urine analysis:    Component Value Date/Time   COLORURINE YELLOW 01/14/2019 1729   APPEARANCEUR HAZY (A) 01/14/2019 1729   LABSPEC 1.012 01/14/2019 1729   PHURINE 5.0 01/14/2019 1729   GLUCOSEU NEGATIVE 01/14/2019 1729   HGBUR NEGATIVE 01/14/2019 1729   BILIRUBINUR NEGATIVE 01/14/2019 1729   KETONESUR NEGATIVE 01/14/2019 1729   PROTEINUR NEGATIVE 01/14/2019 1729   UROBILINOGEN 0.2 10/23/2010 2318   NITRITE POSITIVE (A) 01/14/2019 1729   LEUKOCYTESUR MODERATE (A) 01/14/2019 1729    Radiological Exams on Admission: Dg Chest Port 1 View  Result Date: 01/22/2019 CLINICAL DATA:  Shortness of breath EXAM: PORTABLE CHEST 1 VIEW COMPARISON:  Eight days ago FINDINGS: Cardiomegaly. There is new interstitial opacity with small pleural effusions. Biventricular pacer with leads from both left and right in stable position. Mitral valve replacement. IMPRESSION: CHF pattern. Electronically Signed   By: Monte Fantasia M.D.   On: 01/22/2019 05:03    EKG: Independently reviewed shows paced rhythm.  Assessment/Plan Active Problems:   Nonischemic cardiomyopathy (HCC)   Atrial fibrillation (H[facility])   Sick sinus syndrome (HCC)   Acute on chronic combined systolic and diastolic CHF (congestive heart failure) (HCC)   Acute on chronic systolic and diastolic congestive heart failure: She received Lasix in the emergency department.  I will continue her on Lasix 40  mg IV twice daily.  Echo was done just 2 months ago so there is no indication to repeat that.   We will see how she does with this regimen, if needed we will consider consulting cardiology.  She is known to the group.  Chronic atrial fibrillation: Rate controlled.  She is on amiodarone and carvedilol which I will resume.  INR therapeutic.  Consult pharmacy to dose Coumadin.  Frequent hospitalizations: Will reconsult palliative care.  Recent UTI: Was discharged on Keflex and has yet to complete the course so I will resume Keflex.  DVT prophylaxis: Coumadin Code Status: Full code Family Communication: Discussed with patient Disposition Plan: To be determined pending clinical course Consults called: Palliative care Admission status: Observation   Darliss Cheney MD [facility] Pager [phone]  If 7PM-7AM, please contact night-coverage [email or web address] Password [facility]  01/22/2019, 3:23 PM

## 2019-01-22 NOTE — Progress Notes (Signed)
ANTICOAGULATION CONSULT NOTE - Follow Up Consult  Pharmacy Consult for warfarin Indication: hx atrial fibrillation  No Known Allergies  Patient Measurements: Height: 5\' 6"  (167.6 cm) Weight: 143 lb 8.3 oz (65.1 kg) IBW/kg (Calculated) : 59.3 Heparin Dosing Weight:   Vital Signs: Temp: 97.7 F (36.5 C) (05/29 1437) Temp Source: Oral (05/29 1437) BP: 130/84 (05/29 1437) Pulse Rate: 69 (05/29 1437)  Labs: Recent Labs    01/22/19 0452  HGB 11.3*  HCT 36.4  PLT 265  LABPROT 28.9*  INR 2.8*  CREATININE 0.83  TROPONINI 0.21*    Estimated Creatinine Clearance: 55.7 mL/min (by C-G formula based on SCr of 0.83 mg/dL).   Medications:  - PTA warfarin regimen: 5 mg daily (last dose taken on 5/28)  Assessment: Patient's a 73 y.o F with hx HF, DVT, and afib on warfarin PTA, presented to the ED on 5/29 with c/o SOB.  Warfarin resumed on admission.  Today, 01/22/2019: - INR is therapeutic at 2.8 - no bleeding documented - drug-drug intxns: amiodarone (but on this PTA)  Goal of Therapy:  INR 2-3 Monitor platelets by anticoagulation protocol: Yes   Plan:  - warfarin 5 mg PO x1 today - daily INR - monitor for s/s bleeding  Pham, Anh P 01/22/2019,3:32 PM

## 2019-01-22 NOTE — ED Notes (Signed)
Patient unable to stand for 3 minutes to complete orthostatics. Patient placed on 2 L of O2 via [place].

## 2019-01-23 DIAGNOSIS — I482 Chronic atrial fibrillation, unspecified: Secondary | ICD-10-CM | POA: Diagnosis not present

## 2019-01-23 DIAGNOSIS — I5043 Acute on chronic combined systolic (congestive) and diastolic (congestive) heart failure: Secondary | ICD-10-CM | POA: Diagnosis not present

## 2019-01-23 LAB — BASIC METABOLIC PANEL
Anion gap: 12 (ref 5–15)
BUN: 14 mg/dL (ref 8–23)
CO2: 19 mmol/L — ABNORMAL LOW (ref 22–32)
Calcium: 9 mg/dL (ref 8.9–10.3)
Chloride: 109 mmol/L (ref 98–111)
Creatinine, Ser: 0.98 mg/dL (ref 0.44–1.00)
GFR calc Af Amer: >60 mL/min (ref >60)
GFR calc non Af Amer: 57 mL/min — ABNORMAL LOW (ref >60)
Glucose, Bld: 78 mg/dL (ref 70–99)
Potassium: 4.1 mmol/L (ref 3.5–5.1)
Sodium: 140 mmol/L (ref 135–145)

## 2019-01-23 LAB — PROTIME-INR
INR: 2.7 — ABNORMAL HIGH (ref 0.8–1.2)
Prothrombin Time: 27.9 seconds — ABNORMAL HIGH (ref 11.4–15.2)

## 2019-01-23 MED ORDER — POTASSIUM CHLORIDE ER 10 MEQ PO TBCR: 10.0000 meq | EXTENDED_RELEASE_TABLET | Freq: Every day | ORAL | 0 refills | Status: DC

## 2019-01-23 MED ORDER — FUROSEMIDE 20 MG PO TABS: 20.0000 mg | ORAL_TABLET | Freq: Every day | ORAL | 11 refills | Status: DC

## 2019-01-23 NOTE — Plan of Care (Signed)
Discharge instructions reviewed with patient, husband and son (as per previous note).  Spouse understands patient has had IV lasix today and will need to start back on po Lasix 01/24/2019.  Patient transported via wheelchair to main entrance to be taken home by spouse.

## 2019-01-23 NOTE — Progress Notes (Signed)
Went over discharge instructions with patients husband and son over the phone.  Husband will pick patient up at 12 noon.

## 2019-01-23 NOTE — Discharge Instructions (Signed)
Heart Failure and Exercise °Heart failure is a condition in which the heart does not fill or pump enough blood and oxygen to support your body and its functions. Heart failure is a long-term (chronic) condition. Living with heart failure can be challenging. However, following your health care provider's instructions about a healthy lifestyle may help improve your symptoms. This includes choosing the right exercise plan. °Doing daily physical activity is important after a diagnosis of heart failure. You may have some activity restrictions, so talk to your health care provider before doing any exercises. °What are the benefits of exercise? °Exercise may: °· Make your heart muscles stronger. °· Lower your blood pressure. °· Lower your cholesterol. °· Help you lose weight. °· Help your bones stay strong. °· Improve your blood circulation. °· Help your body use oxygen better. This relieves symptoms such as fatigue and shortness of breath. °· Help your mental health by lowering the risk of depression and other problems. °· Improve your quality of life. °· Decrease your chance of hospital admission for heart failure. °What is an exercise plan? °An exercise plan is a set of specific exercises and training activities. You will work with your health care provider to create the exercise plan that works for you. The plan may include: °· Different types of exercises and how to do them. °· Cardiac rehabilitation exercises. These are supervised programs that are designed to strengthen your heart. °What are strengthening exercises? °Strengthening exercises are a type of physical activity that involves using resistance to improve your muscle strength. Strengthening exercises usually have repetitive motions. These types of exercises can include: °· Lifting weights. °· Using weight machines. °· Using resistance tubes and bands. °· Using kettlebells. °· Using your body weight, such as doing push-ups or squats. °What are balance  exercises? °Balance exercises are another type of physical activity. They strengthen the muscles of the back, stomach, and pelvis (core muscles) and improve your balance. They can also lower your risk of falling. These types of exercises can include: °· Standing on one leg. °· Walking backward, sideways, and in a straight line. °· Standing up after sitting, without using your hands. °· Shifting your weight from one leg to the other. °· Lifting one leg in front of you. °· Doing tai chi. This is a type of exercise that uses slow movements and deep breathing. °How can I increase my flexibility? °Having better flexibility can keep you from falling. It can also lengthen your muscles, improve your range of motion, and help your joints. You can increase your flexibility by: °· Doing tai chi. °· Doing yoga. °· Stretching. °How much aerobic exercise should I get? ° °Aerobic exercises strengthen your breathing and circulation system and increase your body's use of oxygen. Examples of aerobic exercise include biking, walking, running, and swimming. Talk to your health care provider to find out how much aerobic exercise is safe for you.  °To do these exercises: °· Start exercising slowly, limiting the amount of time at first. You may need to start with 5 minutes of aerobic exercise every day. °· Slowly add more minutes until you can safely do at least 30 minutes of exercise at least 4 days a week. °Summary °· Daily physical activity is important after a diagnosis of heart failure. °· Exercise can make your heart muscles stronger. It also offers other benefits that will improve your health. °· Talk to your health care provider before doing any exercises. °This information is not intended to replace advice   given to you by your health care provider. Make sure you discuss any questions you have with your health care provider. Document Released: 12/24/2016 Document Revised: 12/24/2016 Document Reviewed: 12/24/2016 [employer]  Interactive Patient Education  2019 [employer]  

## 2019-01-23 NOTE — Discharge Summary (Signed)
Physician Discharge Summary  Angela [name] MBW:[id number] DOB: 1946/07/12 DOA: 01/22/2019  PCP: Lorene Dy, MD  Admit date: 01/22/2019 Discharge date: 01/23/2019  Admitted From: Home Disposition: Home  Recommendations for Outpatient Follow-up:  1. Follow up with PCP in 1-2 weeks 2. Please obtain BMP/CBC in one week 3. Please follow up on the following pending results:  Home Health: None Equipment/Devices: None  Discharge Condition: Stable CODE STATUS: Full code Diet recommendation: Low-sodium diet  Subjective: Patient seen and examined.  She is alert and oriented at her baseline.  No shortness of breath.  She is off of oxygen and eating breakfast without any discomfort.  Brief/Interim Summary: Angela [name] is a 73 y.o. female with medical history significant of dementia, atrial fibrillation on Coumadin, nonischemic cardiomyopathy, nonsustained V. tach with AICD implanted, history of DVT, hypertension Who was discharged from the hospital just 3 days ago after staying here for 5 days for acute metabolic encephalopathy secondary to UTI returned to the emergency department yesterday on 01/22/2019 with a complaint of shortness of breath and chest pain.  The pain lasted only for 10 minutes.  She was unable to describe the character of the pain.  Upon arrival to the ER, she was pain-free but had shortness of breath.  Patient denies any fever, chills, sweating, any sick contact.  Based on the further work-up, elevated BNP and chest x-ray, she was diagnosed with acute on chronic combined systolic and diastolic congestive heart failure.  She received 1 dose of Lasix in the emergency department.  She was tested negative for COVID-19.  Admitted under hospitalist service.  IV Lasix was continued.  1 troponin was checked which was slightly elevated but compared to all her previous troponins in the chart, this was her baseline.  Patient seen and examined again today.  She is off of the oxygen for last  several hours.  She is eating breakfast with no dyspnea and has no crackles on exam.  I discussed the discharge plan with her husband over the phone who is in agreement so we will discharge the patient back to home.  During her last hospitalization, her Lasix was completely discontinued which might have caused the exacerbation so at this point in time I am going to prescribe her low-dose of Lasix 20 mg p.o. daily along with potassium chloride and she will resume rest of her medications.  She has therapeutic INR today.  Once again she should follow-up with palliative care as an outpatient due to recurrent hospitalizations/goal of care.  Discharge Diagnoses:  Active Problems:   Nonischemic cardiomyopathy (HCC)   Atrial fibrillation (HCC)   Sick sinus syndrome (HCC)   Acute on chronic combined systolic and diastolic CHF (congestive heart failure) [facility])    Discharge Instructions  Discharge Instructions    Discharge patient   Complete by:  As directed    Discharge disposition:  01-Home or Self Care   Discharge patient date:  01/23/2019     Allergies as of 01/23/2019   No Known Allergies     Medication List    TAKE these medications   amiodarone 200 MG tablet Commonly known as:  PACERONE Take 2 tablets (400 mg total) by mouth daily for 5 days, THEN 2 tablets (400 mg total) daily for 30 days. Start taking on:  Jan 19, 2019   carvedilol 3.125 MG tablet Commonly known as:  COREG Take 1 tablet (3.125 mg total) by mouth 2 (two) times daily with a meal.   cephALEXin 500 MG capsule  Commonly known as:  KEFLEX Take 1 capsule (500 mg total) by mouth every 12 (twelve) hours for 5 days.   furosemide 20 MG tablet Commonly known as:  Lasix Take 1 tablet (20 mg total) by mouth daily.   potassium chloride 10 MEQ tablet Commonly known as:  K-DUR Take 1 tablet (10 mEq total) by mouth daily for 30 days.   warfarin 1 MG tablet Commonly known as:  COUMADIN Take 5 mg by mouth daily.       Follow-up Information    Lorene Dy, MD Follow up in 1 week(s).   Specialty:  Internal Medicine Contact information: [place], [place] [address] [phone]        Evans Lance, MD .   Specialty:  Cardiology Contact information: [phone] [place] [phone]          No Known Allergies  Consultations:    Procedures/Studies: Dg Chest Port 1 View  Result Date: 01/22/2019 CLINICAL DATA:  Shortness of breath EXAM: PORTABLE CHEST 1 VIEW COMPARISON:  Eight days ago FINDINGS: Cardiomegaly. There is new interstitial opacity with small pleural effusions. Biventricular pacer with leads from both left and right in stable position. Mitral valve replacement. IMPRESSION: CHF pattern. Electronically Signed   By: Monte Fantasia M.D.   On: 01/22/2019 05:03   Dg Chest Portable 1 View  Result Date: 01/14/2019 CLINICAL DATA:  Nausea and vomiting.  Fever x1 day. EXAM: PORTABLE CHEST 1 VIEW COMPARISON:  Chest x-ray dated 11/26/2018 FINDINGS: Bilateral pacemakers are again noted. The heart size is enlarged. Patient is status post prior median sternotomy and valve replacement. There is no pneumothorax. There is mild generalized volume overload. No large area of consolidation. IMPRESSION: Stable appearance of the chest. Electronically Signed   By: Constance Holster M.D.   On: 01/14/2019 15:27   Dg Abd Portable 1 View  Result Date: 01/14/2019 CLINICAL DATA:  Vomiting and fever EXAM: PORTABLE ABDOMEN - 1 VIEW COMPARISON:  11/30/2018 FINDINGS: The bowel gas pattern is normal. No radio-opaque calculi or other significant radiographic abnormality are seen. There is a mild-to-moderate amount of stool in the colon. Multiple phleboliths project over the patient's pelvis. IMPRESSION: Nonobstructive bowel gas pattern. Electronically Signed   By: Constance Holster M.D.   On: 01/14/2019 15:28      Discharge Exam: Vitals:   01/22/19 2250  01/23/19 0541  BP: 110/75 107/70  Pulse: 73 73  Resp:  16  Temp: 98.2 F (36.8 C) 98 F (36.7 C)  SpO2: 100% 100%   Vitals:   01/22/19 1815 01/22/19 2250 01/23/19 0500 01/23/19 0541  BP:  110/75  107/70  Pulse: 70 73  73  Resp:    16  Temp:  98.2 F (36.8 C)  98 F (36.7 C)  TempSrc:  Oral  Oral  SpO2:  100%  100%  Weight:   61.3 kg   Height:        General: Pt is alert, awake, not in acute distress Cardiovascular: RRR, S1/S2 +, no rubs, no gallops Respiratory: CTA bilaterally, no wheezing, no rhonchi Abdominal: Soft, NT, ND, bowel sounds + Extremities: no edema, no cyanosis    The results of significant diagnostics from this hospitalization (including imaging, microbiology, ancillary and laboratory) are listed below for reference.     Microbiology: Recent Results (from the past 240 hour(s))  Culture, blood (routine x 2)     Status: Abnormal   Collection Time: 01/14/19  3:49 PM  Result Value Ref Range Status   Specimen Description BLOOD LEFT ARM  Final   Special Requests   Final    BOTTLES DRAWN AEROBIC AND ANAEROBIC Blood Culture results may not be optimal due to an inadequate volume of blood received in culture bottles   Culture  Setup Time   Final    GRAM NEGATIVE RODS IN BOTH AEROBIC AND ANAEROBIC BOTTLES CRITICAL VALUE NOTED.  VALUE IS CONSISTENT WITH PREVIOUSLY REPORTED AND CALLED VALUE.    Culture (A)  Final    ESCHERICHIA COLI SUSCEPTIBILITIES PERFORMED ON PREVIOUS CULTURE WITHIN THE LAST 5 DAYS. Performed at [facility], [place] [address] [place], [place] [address]    Report Status 01/17/2019 FINAL  Final  Culture, blood (routine x 2)     Status: Abnormal   Collection Time: 01/14/19  3:50 PM  Result Value Ref Range Status   Specimen Description BLOOD RIGHT HAND  Final   Special Requests   Final    BOTTLES DRAWN AEROBIC AND ANAEROBIC Blood Culture adequate volume   Culture  Setup Time   Final    GRAM NEGATIVE RODS IN BOTH AEROBIC AND ANAEROBIC  BOTTLES CRITICAL RESULT CALLED TO, READ BACK BY AND VERIFIED WITH: Salli Real [id number] 01/15/2019 Mena Goes Performed at [facility], [place] [address] [place], [place] [address]    Culture ESCHERICHIA COLI (A)  Final   Report Status 01/17/2019 FINAL  Final   Organism ID, Bacteria ESCHERICHIA COLI  Final      Susceptibility   Escherichia coli - MIC*    AMPICILLIN <=2 SENSITIVE Sensitive     CEFAZOLIN <=4 SENSITIVE Sensitive     CEFEPIME <=1 SENSITIVE Sensitive     CEFTAZIDIME <=1 SENSITIVE Sensitive     CEFTRIAXONE <=1 SENSITIVE Sensitive     CIPROFLOXACIN <=0.25 SENSITIVE Sensitive     GENTAMICIN <=1 SENSITIVE Sensitive     IMIPENEM <=0.25 SENSITIVE Sensitive     TRIMETH/SULFA <=20 SENSITIVE Sensitive     AMPICILLIN/SULBACTAM <=2 SENSITIVE Sensitive     PIP/TAZO <=4 SENSITIVE Sensitive     Extended ESBL NEGATIVE Sensitive     * ESCHERICHIA COLI  Blood Culture ID Panel (Reflexed)     Status: Abnormal   Collection Time: 01/14/19  3:50 PM  Result Value Ref Range Status   Enterococcus species NOT DETECTED NOT DETECTED Final   Listeria monocytogenes NOT DETECTED NOT DETECTED Final   Staphylococcus species NOT DETECTED NOT DETECTED Final   Staphylococcus aureus (BCID) NOT DETECTED NOT DETECTED Final   Streptococcus species NOT DETECTED NOT DETECTED Final   Streptococcus agalactiae NOT DETECTED NOT DETECTED Final   Streptococcus pneumoniae NOT DETECTED NOT DETECTED Final   Streptococcus pyogenes NOT DETECTED NOT DETECTED Final   Acinetobacter baumannii NOT DETECTED NOT DETECTED Final   Enterobacteriaceae species DETECTED (A) NOT DETECTED Final    Comment: Enterobacteriaceae represent a large family of gram-negative bacteria, not a single organism. CRITICAL RESULT CALLED TO, READ BACK BY AND VERIFIED WITH: G. ABBOTT,PHARMD [id number] 01/15/2019 T. TYSOR    Enterobacter cloacae complex NOT DETECTED NOT DETECTED Final   Escherichia coli DETECTED (A) NOT DETECTED Final    Comment:  CRITICAL RESULT CALLED TO, READ BACK BY AND VERIFIED WITH: G. ABBOTT,PHARMD [id number] 01/15/2019 T. TYSOR    Klebsiella oxytoca NOT DETECTED NOT DETECTED Final   Klebsiella pneumoniae NOT DETECTED NOT DETECTED Final   Proteus species NOT DETECTED NOT DETECTED Final   Serratia marcescens NOT DETECTED NOT DETECTED Final   Carbapenem  resistance NOT DETECTED NOT DETECTED Final   Haemophilus influenzae NOT DETECTED NOT DETECTED Final   Neisseria meningitidis NOT DETECTED NOT DETECTED Final   Pseudomonas aeruginosa NOT DETECTED NOT DETECTED Final   Candida albicans NOT DETECTED NOT DETECTED Final   Candida glabrata NOT DETECTED NOT DETECTED Final   Candida krusei NOT DETECTED NOT DETECTED Final   Candida parapsilosis NOT DETECTED NOT DETECTED Final   Candida tropicalis NOT DETECTED NOT DETECTED Final    Comment: Performed at [facility], [place] [address], [place], [place] [address]  SARS Coronavirus 2 (CEPHEID- Performed in [place] hospital lab), Hosp Order     Status: None   Collection Time: 01/14/19  5:06 PM  Result Value Ref Range Status   SARS Coronavirus 2 NEGATIVE NEGATIVE Final    Comment: (NOTE) If result is NEGATIVE SARS-CoV-2 target nucleic acids are NOT DETECTED. The SARS-CoV-2 RNA is generally detectable in upper and lower  respiratory specimens during the acute phase of infection. The lowest  concentration of SARS-CoV-2 viral copies this assay can detect is 250  copies / mL. A negative result does not preclude SARS-CoV-2 infection  and should not be used as the sole basis for treatment or other  patient management decisions.  A negative result may occur with  improper specimen collection / handling, submission of specimen other  than nasopharyngeal swab, presence of viral mutation(s) within the  areas targeted by this assay, and inadequate number of viral copies  (<250 copies / mL). A negative result must be combined with clinical  observations, patient history, and  epidemiological information. If result is POSITIVE SARS-CoV-2 target nucleic acids are DETECTED. The SARS-CoV-2 RNA is generally detectable in upper and lower  respiratory specimens dur ing the acute phase of infection.  Positive  results are indicative of active infection with SARS-CoV-2.  Clinical  correlation with patient history and other diagnostic information is  necessary to determine patient infection status.  Positive results do  not rule out bacterial infection or co-infection with other viruses. If result is PRESUMPTIVE POSTIVE SARS-CoV-2 nucleic acids MAY BE PRESENT.   A presumptive positive result was obtained on the submitted specimen  and confirmed on repeat testing.  While 2019 novel coronavirus  (SARS-CoV-2) nucleic acids may be present in the submitted sample  additional confirmatory testing may be necessary for epidemiological  and / or clinical management purposes  to differentiate between  SARS-CoV-2 and other Sarbecovirus currently known to infect humans.  If clinically indicated additional testing with an alternate test  methodology [phone]) is advised. The SARS-CoV-2 RNA is generally  detectable in upper and lower respiratory sp ecimens during the acute  phase of infection. The expected result is Negative. Fact Sheet for Patients:  StrictlyIdeas.no Fact Sheet for Healthcare Providers: BankingDealers.co.za This test is not yet approved or cleared by the [place] FDA and has been authorized for detection and/or diagnosis of SARS-CoV-2 by FDA under an Emergency Use Authorization (EUA).  This EUA will remain in effect (meaning this test can be used) for the duration of the COVID-19 declaration under Section 564(b)(1) of the Act, 21 U.S.C. section 360bbb-3(b)(1), unless the authorization is terminated or revoked sooner. Performed at [facility], [place] [address] [place], [place] [address]   SARS  Coronavirus 2 (CEPHEID- Performed in [facility] hospital lab), Hosp Order     Status: None   Collection Time: 01/22/19  6:36 AM  Result Value Ref Range Status   SARS Coronavirus 2  NEGATIVE NEGATIVE Final    Comment: (NOTE) If result is NEGATIVE SARS-CoV-2 target nucleic acids are NOT DETECTED. The SARS-CoV-2 RNA is generally detectable in upper and lower  respiratory specimens during the acute phase of infection. The lowest  concentration of SARS-CoV-2 viral copies this assay can detect is 250  copies / mL. A negative result does not preclude SARS-CoV-2 infection  and should not be used as the sole basis for treatment or other  patient management decisions.  A negative result may occur with  improper specimen collection / handling, submission of specimen other  than nasopharyngeal swab, presence of viral mutation(s) within the  areas targeted by this assay, and inadequate number of viral copies  (<250 copies / mL). A negative result must be combined with clinical  observations, patient history, and epidemiological information. If result is POSITIVE SARS-CoV-2 target nucleic acids are DETECTED. The SARS-CoV-2 RNA is generally detectable in upper and lower  respiratory specimens dur ing the acute phase of infection.  Positive  results are indicative of active infection with SARS-CoV-2.  Clinical  correlation with patient history and other diagnostic information is  necessary to determine patient infection status.  Positive results do  not rule out bacterial infection or co-infection with other viruses. If result is PRESUMPTIVE POSTIVE SARS-CoV-2 nucleic acids MAY BE PRESENT.   A presumptive positive result was obtained on the submitted specimen  and confirmed on repeat testing.  While 2019 novel coronavirus  (SARS-CoV-2) nucleic acids may be present in the submitted sample  additional confirmatory testing may be necessary for epidemiological  and / or clinical management purposes  to  differentiate between  SARS-CoV-2 and other Sarbecovirus currently known to infect humans.  If clinically indicated additional testing with an alternate test  methodology [phone]) is advised. The SARS-CoV-2 RNA is generally  detectable in upper and lower respiratory sp ecimens during the acute  phase of infection. The expected result is Negative. Fact Sheet for Patients:  StrictlyIdeas.no Fact Sheet for Healthcare Providers: BankingDealers.co.za This test is not yet approved or cleared by the [place] FDA and has been authorized for detection and/or diagnosis of SARS-CoV-2 by FDA under an Emergency Use Authorization (EUA).  This EUA will remain in effect (meaning this test can be used) for the duration of the COVID-19 declaration under Section 564(b)(1) of the Act, 21 U.S.C. section 360bbb-3(b)(1), unless the authorization is terminated or revoked sooner. Performed at [facility], [place] [address], [place], [place] [address]      Labs: BNP (last 3 results) Recent Labs    10/26/18 1855 01/22/19 0452  BNP 1,092.7* 4,401.0*   Basic Metabolic Panel: Recent Labs  Lab 01/17/19 0559 01/18/19 0448 01/19/19 0405 01/22/19 0452 01/23/19 0458  NA 139 135 139 137 140  K 3.7 3.7 3.6 4.3 4.1  CL 108 103 110 110 109  CO2 19* 20* 19* 19* 19*  GLUCOSE 97 113* 103* 111* 78  BUN 17 15 14 13 14   CREATININE 0.89 0.90 0.88 0.83 0.98  CALCIUM 8.4* 8.4* 8.6* 8.8* 9.0  MG  --   --  2.0  --   --   PHOS 3.2 2.8  --   --   --    Liver Function Tests: Recent Labs  Lab 01/17/19 0559 01/18/19 0448 01/19/19 0405  AST  --   --  17  ALT  --   --  27  ALKPHOS  --   --  40  BILITOT  --   --  0.5  PROT  --   --  6.1*  ALBUMIN 2.4* 2.5* 2.5*   No results for input(s): LIPASE, AMYLASE in the last 168 hours. No results for input(s): AMMONIA in the last 168 hours. CBC: Recent Labs  Lab 01/17/19 0559 01/18/19 0448  01/22/19 0452  WBC 10.4 5.8 5.1  NEUTROABS 8.0* 3.8 3.3  HGB 10.8* 11.1* 11.3*  HCT 33.9* 35.0* 36.4  MCV 93.1 93.3 97.3  PLT 189 198 265   Cardiac Enzymes: Recent Labs  Lab 01/22/19 0452  TROPONINI 0.21*   BNP: Invalid input(s): POCBNP CBG: Recent Labs  Lab 01/22/19 0626  GLUCAP 95   D-Dimer No results for input(s): DDIMER in the last 72 hours. Hgb A1c No results for input(s): HGBA1C in the last 72 hours. Lipid Profile No results for input(s): CHOL, HDL, LDLCALC, TRIG, CHOLHDL, LDLDIRECT in the last 72 hours. Thyroid function studies Recent Labs    01/22/19 1519  TSH 2.338   Anemia work up No results for input(s): VITAMINB12, FOLATE, FERRITIN, TIBC, IRON, RETICCTPCT in the last 72 hours. Urinalysis    Component Value Date/Time   COLORURINE YELLOW 01/14/2019 1729   APPEARANCEUR HAZY (A) 01/14/2019 1729   LABSPEC 1.012 01/14/2019 1729   PHURINE 5.0 01/14/2019 1729   GLUCOSEU NEGATIVE 01/14/2019 1729   HGBUR NEGATIVE 01/14/2019 1729   BILIRUBINUR NEGATIVE 01/14/2019 1729   KETONESUR NEGATIVE 01/14/2019 1729   PROTEINUR NEGATIVE 01/14/2019 1729   UROBILINOGEN 0.2 10/23/2010 2318   NITRITE POSITIVE (A) 01/14/2019 1729   LEUKOCYTESUR MODERATE (A) 01/14/2019 1729   Sepsis Labs Invalid input(s): PROCALCITONIN,  WBC,  LACTICIDVEN Microbiology Recent Results (from the past 240 hour(s))  Culture, blood (routine x 2)     Status: Abnormal   Collection Time: 01/14/19  3:49 PM  Result Value Ref Range Status   Specimen Description BLOOD LEFT ARM  Final   Special Requests   Final    BOTTLES DRAWN AEROBIC AND ANAEROBIC Blood Culture results may not be optimal due to an inadequate volume of blood received in culture bottles   Culture  Setup Time   Final    GRAM NEGATIVE RODS IN BOTH AEROBIC AND ANAEROBIC BOTTLES CRITICAL VALUE NOTED.  VALUE IS CONSISTENT WITH PREVIOUSLY REPORTED AND CALLED VALUE.    Culture (A)  Final    ESCHERICHIA COLI SUSCEPTIBILITIES PERFORMED  ON PREVIOUS CULTURE WITHIN THE LAST 5 DAYS. Performed at [facility], [place] [address], [place], [place] [address]    Report Status 01/17/2019 FINAL  Final  Culture, blood (routine x 2)     Status: Abnormal   Collection Time: 01/14/19  3:50 PM  Result Value Ref Range Status   Specimen Description BLOOD RIGHT HAND  Final   Special Requests   Final    BOTTLES DRAWN AEROBIC AND ANAEROBIC Blood Culture adequate volume   Culture  Setup Time   Final    GRAM NEGATIVE RODS IN BOTH AEROBIC AND ANAEROBIC BOTTLES CRITICAL RESULT CALLED TO, READ BACK BY AND VERIFIED WITH: Salli Real 0630 01/15/2019 Mena Goes Performed at [facility], [place] [address] [place], [place] [address]    Culture ESCHERICHIA COLI (A)  Final   Report Status 01/17/2019 FINAL  Final   Organism ID, Bacteria ESCHERICHIA COLI  Final      Susceptibility   Escherichia coli - MIC*    AMPICILLIN <=2 SENSITIVE Sensitive     CEFAZOLIN <=4 SENSITIVE Sensitive     CEFEPIME <=1 SENSITIVE Sensitive  CEFTAZIDIME <=1 SENSITIVE Sensitive     CEFTRIAXONE <=1 SENSITIVE Sensitive     CIPROFLOXACIN <=0.25 SENSITIVE Sensitive     GENTAMICIN <=1 SENSITIVE Sensitive     IMIPENEM <=0.25 SENSITIVE Sensitive     TRIMETH/SULFA <=20 SENSITIVE Sensitive     AMPICILLIN/SULBACTAM <=2 SENSITIVE Sensitive     PIP/TAZO <=4 SENSITIVE Sensitive     Extended ESBL NEGATIVE Sensitive     * ESCHERICHIA COLI  Blood Culture ID Panel (Reflexed)     Status: Abnormal   Collection Time: 01/14/19  3:50 PM  Result Value Ref Range Status   Enterococcus species NOT DETECTED NOT DETECTED Final   Listeria monocytogenes NOT DETECTED NOT DETECTED Final   Staphylococcus species NOT DETECTED NOT DETECTED Final   Staphylococcus aureus (BCID) NOT DETECTED NOT DETECTED Final   Streptococcus species NOT DETECTED NOT DETECTED Final   Streptococcus agalactiae NOT DETECTED NOT DETECTED Final   Streptococcus pneumoniae NOT DETECTED NOT DETECTED Final    Streptococcus pyogenes NOT DETECTED NOT DETECTED Final   Acinetobacter baumannii NOT DETECTED NOT DETECTED Final   Enterobacteriaceae species DETECTED (A) NOT DETECTED Final    Comment: Enterobacteriaceae represent a large family of gram-negative bacteria, not a single organism. CRITICAL RESULT CALLED TO, READ BACK BY AND VERIFIED WITH: G. ABBOTT,PHARMD [id number] 01/15/2019 T. TYSOR    Enterobacter cloacae complex NOT DETECTED NOT DETECTED Final   Escherichia coli DETECTED (A) NOT DETECTED Final    Comment: CRITICAL RESULT CALLED TO, READ BACK BY AND VERIFIED WITH: G. ABBOTT,PHARMD [id number] 01/15/2019 T. TYSOR    Klebsiella oxytoca NOT DETECTED NOT DETECTED Final   Klebsiella pneumoniae NOT DETECTED NOT DETECTED Final   Proteus species NOT DETECTED NOT DETECTED Final   Serratia marcescens NOT DETECTED NOT DETECTED Final   Carbapenem resistance NOT DETECTED NOT DETECTED Final   Haemophilus influenzae NOT DETECTED NOT DETECTED Final   Neisseria meningitidis NOT DETECTED NOT DETECTED Final   Pseudomonas aeruginosa NOT DETECTED NOT DETECTED Final   Candida albicans NOT DETECTED NOT DETECTED Final   Candida glabrata NOT DETECTED NOT DETECTED Final   Candida krusei NOT DETECTED NOT DETECTED Final   Candida parapsilosis NOT DETECTED NOT DETECTED Final   Candida tropicalis NOT DETECTED NOT DETECTED Final    Comment: Performed at [facility], [place]. [address], [place], [place] [address]  SARS Coronavirus 2 (CEPHEID- Performed in [place] hospital lab), Hosp Order     Status: None   Collection Time: 01/14/19  5:06 PM  Result Value Ref Range Status   SARS Coronavirus 2 NEGATIVE NEGATIVE Final    Comment: (NOTE) If result is NEGATIVE SARS-CoV-2 target nucleic acids are NOT DETECTED. The SARS-CoV-2 RNA is generally detectable in upper and lower  respiratory specimens during the acute phase of infection. The lowest  concentration of SARS-CoV-2 viral copies this assay can detect is 250  copies /  mL. A negative result does not preclude SARS-CoV-2 infection  and should not be used as the sole basis for treatment or other  patient management decisions.  A negative result may occur with  improper specimen collection / handling, submission of specimen other  than nasopharyngeal swab, presence of viral mutation(s) within the  areas targeted by this assay, and inadequate number of viral copies  (<250 copies / mL). A negative result must be combined with clinical  observations, patient history, and epidemiological information. If result is POSITIVE SARS-CoV-2 target nucleic acids are DETECTED. The SARS-CoV-2 RNA is generally detectable in upper and lower  respiratory specimens dur ing the acute phase of infection.  Positive  results are indicative of active infection with SARS-CoV-2.  Clinical  correlation with patient history and other diagnostic information is  necessary to determine patient infection status.  Positive results do  not rule out bacterial infection or co-infection with other viruses. If result is PRESUMPTIVE POSTIVE SARS-CoV-2 nucleic acids MAY BE PRESENT.   A presumptive positive result was obtained on the submitted specimen  and confirmed on repeat testing.  While 2019 novel coronavirus  (SARS-CoV-2) nucleic acids may be present in the submitted sample  additional confirmatory testing may be necessary for epidemiological  and / or clinical management purposes  to differentiate between  SARS-CoV-2 and other Sarbecovirus currently known to infect humans.  If clinically indicated additional testing with an alternate test  methodology [phone]) is advised. The SARS-CoV-2 RNA is generally  detectable in upper and lower respiratory sp ecimens during the acute  phase of infection. The expected result is Negative. Fact Sheet for Patients:  StrictlyIdeas.no Fact Sheet for Healthcare Providers: BankingDealers.co.za This test is  not yet approved or cleared by the [place] FDA and has been authorized for detection and/or diagnosis of SARS-CoV-2 by FDA under an Emergency Use Authorization (EUA).  This EUA will remain in effect (meaning this test can be used) for the duration of the COVID-19 declaration under Section 564(b)(1) of the Act, 21 U.S.C. section 360bbb-3(b)(1), unless the authorization is terminated or revoked sooner. Performed at [facility], [place] [address], [place], [place] [address]   SARS Coronavirus 2 (CEPHEID- Performed in [place] hospital lab), Hosp Order     Status: None   Collection Time: 01/22/19  6:36 AM  Result Value Ref Range Status   SARS Coronavirus 2 NEGATIVE NEGATIVE Final    Comment: (NOTE) If result is NEGATIVE SARS-CoV-2 target nucleic acids are NOT DETECTED. The SARS-CoV-2 RNA is generally detectable in upper and lower  respiratory specimens during the acute phase of infection. The lowest  concentration of SARS-CoV-2 viral copies this assay can detect is 250  copies / mL. A negative result does not preclude SARS-CoV-2 infection  and should not be used as the sole basis for treatment or other  patient management decisions.  A negative result may occur with  improper specimen collection / handling, submission of specimen other  than nasopharyngeal swab, presence of viral mutation(s) within the  areas targeted by this assay, and inadequate number of viral copies  (<250 copies / mL). A negative result must be combined with clinical  observations, patient history, and epidemiological information. If result is POSITIVE SARS-CoV-2 target nucleic acids are DETECTED. The SARS-CoV-2 RNA is generally detectable in upper and lower  respiratory specimens dur ing the acute phase of infection.  Positive  results are indicative of active infection with SARS-CoV-2.  Clinical  correlation with patient history and other diagnostic information is  necessary to determine patient  infection status.  Positive results do  not rule out bacterial infection or co-infection with other viruses. If result is PRESUMPTIVE POSTIVE SARS-CoV-2 nucleic acids MAY BE PRESENT.   A presumptive positive result was obtained on the submitted specimen  and confirmed on repeat testing.  While 2019 novel coronavirus  (SARS-CoV-2) nucleic acids may be present in the submitted sample  additional confirmatory testing may be necessary for epidemiological  and / or clinical management purposes  to differentiate between  SARS-CoV-2 and other Sarbecovirus currently known to infect humans.  If clinically indicated  additional testing with an alternate test  methodology [phone]) is advised. The SARS-CoV-2 RNA is generally  detectable in upper and lower respiratory sp ecimens during the acute  phase of infection. The expected result is Negative. Fact Sheet for Patients:  StrictlyIdeas.no Fact Sheet for Healthcare Providers: BankingDealers.co.za This test is not yet approved or cleared by the [place] FDA and has been authorized for detection and/or diagnosis of SARS-CoV-2 by FDA under an Emergency Use Authorization (EUA).  This EUA will remain in effect (meaning this test can be used) for the duration of the COVID-19 declaration under Section 564(b)(1) of the Act, 21 U.S.C. section 360bbb-3(b)(1), unless the authorization is terminated or revoked sooner. Performed at [facility], [place] [address] [place], [place] [address]      Time coordinating discharge: 30 minutes  SIGNED:   Darliss Cheney, MD  [facility] 01/23/2019, 9:05 AM Pager [phone]  If 7PM-7AM, please contact night-coverage [email or web address] Password [email or web address]

## 2019-01-26 ENCOUNTER — Telehealth: Payer: Self-pay

## 2019-01-26 NOTE — Telephone Encounter (Signed)
Phone call placed to patient to offer to schedule a visit with Palliative Care. Scheduled for Thursday 01/28/2019

## 2019-01-28 ENCOUNTER — Other Ambulatory Visit: Payer: Self-pay

## 2019-01-28 ENCOUNTER — Other Ambulatory Visit: Payer: Medicare HMO | Admitting: Adult Health Nurse Practitioner

## 2019-01-28 DIAGNOSIS — Z515 Encounter for palliative care: Secondary | ICD-10-CM

## 2019-01-28 NOTE — Progress Notes (Signed)
[facility] Consult Note Telephone: [phone]  Fax: [phone]  PATIENT NAME: Angela [name] DOB: August 09, 1946 MRN: [id number]  PRIMARY CARE PROVIDER:   Lorene Dy, MD  REFERRING PROVIDER:  Lorene Dy, MD [address], [place], [place] [address]  RESPONSIBLE PARTY:     Angela [name], husband [phone]    RECOMMENDATIONS and PLAN:  1.  CHF.  Patient was in hospital 3/84-6/65/9935 for metabolic encephalopathy due to UTI and then again 5/29-5/30/2020 for CHF exacerbation.  Husband states that 9 pounds of fluid was diuresed and she was started on Lasix 20 mg daily upon discharge.  Patient complains today that she does not feel well.  She states feeling light headed. Denies chest pain, fever, cough.  Husband said that her BP was low this morning with SBP of 83.  Checked BP manually and it was 80/62.  Believe this may be due the addition of lasix.  She does have trace edema to bilateral feet.  Recommend either continuing lasix 20mg  every M,W,F or taking a half tab (10mg ) daily and monitoring her BP to see if this helps with the low BP.  Did encourage husband and patient to increase fluid intake for today and to hold lasix tomorrow as she has already taken her meds today  2.  Dementia.  FAST 6a. States that she uses a cane to ambulate. She still needs assistance but will try to get up by herself. States that she feeds herself and she has been eating and drinking well.  Weight has been stable  3.  Perirectal abscess.  Husband states that this is improving and she no longer complains of any pain with it.  Continue current wound protocol through [facility].  4.  Advanced care planning.  Patient remains a full code.  Husband did not want to talk about ACP today  I spent 30 minutes providing this consultation,  from 11:00 to 11:30. More than 50% of the time in this consultation was spent coordinating communication.   HISTORY OF PRESENT ILLNESS:   Angela [name] is a 73 y.o. year old female with multiple medical problems including dementia, afib on coumadin, cardiomyopathy with EF 15-20%, CHF, arthritis. Palliative Care was asked to help address goals of care.   CODE STATUS: Full Code  PPS: 40% HOSPICE ELIGIBILITY/DIAGNOSIS: TBD  PHYSICAL EXAM:  BP: 80/62  HR: 74 O2 sat 97% on RA General: patient sitting in chair in kitchen in NAD Cardiovascular: regular rate and rhythm Pulmonary: lung sounds clear; normal respiratory effort Extremities: trace pitting edema to bilateral feet, no joint deformities Skin: perianal abscess healing, did not take off bandage for exam Neurological: Weakness; A&O to person and place  PAST MEDICAL HISTORY:  Past Medical History:  Diagnosis Date  . Abnormality of gait 08/23/2014  . Arthritis   . Atrial fibrillation ([place])    A.  Chronic Coumadin  . Cardiac arrest - ventricular fibrillation    A.  12/2007;  B. 01/2008 St. Jude Promote Bi-V ICD placed  . CVA (cerebral vascular accident) ([place])   . Diverticular disease   . DVT (deep venous thrombosis) ([place])   . 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 History of colon cancer    A.  1999 - T3, N1  chemotherapy  . Internal hemorrhoids   . Memory deficit 08/23/2014  . Nonischemic cardiomyopathy ([place])    a.  11/2001 - Cath -  NL Cors; b. ECHO 09/22/11: EF 20%, MVR normal, moderate LAE, mild RAE c. ECHO (08/2012): ED 20%, diff HK, LA mod dilated, mild/mod TR, RV mild/mod decreased sys fx  . Rectal bleeding 04/28/2017  . Rheumatic heart disease    A. 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 Sick sinus syndrome ([place])    A.  s/p pacer in 1995.  B.    . Systolic CHF, chronic ([place])    A.  01/2008 Echo - EF 10-20%  . UTI (urinary tract infection) 01/19/2019    SOCIAL HX:  Social History   Tobacco Use  . Smoking status: Never Smoker  . Smokeless tobacco: Never Used  Substance Use Topics  . Alcohol use: No     ALLERGIES: No Known Allergies   PERTINENT MEDICATIONS:  Outpatient Encounter Medications as of 01/28/2019  Medication Sig  . amiodarone (PACERONE) 200 MG tablet Take 2 tablets (400 mg total) by mouth daily for 5 days, THEN 2 tablets (400 mg total) daily for 30 days.  . carvedilol (COREG) 3.125 MG tablet Take 1 tablet (3.125 mg total) by mouth 2 (two) times daily with a meal.  . furosemide (LASIX) 20 MG tablet Take 1 tablet (20 mg total) by mouth daily.  . furosemide (LASIX) 20 MG tablet Take 1 tablet (20 mg total) by mouth daily.  . potassium chloride (K-DUR) 10 MEQ tablet Take 1 tablet (10 mEq total) by mouth daily for 30 days.  . potassium chloride (K-DUR) 10 MEQ tablet Take 1 tablet (10 mEq total) by mouth daily for 30 days.  [name] warfarin (COUMADIN) 1 MG tablet Take 5 mg by mouth daily.   No facility-administered encounter medications on file as of 01/28/2019.       Amy Jenetta Downer, NP

## 2019-02-01 ENCOUNTER — Ambulatory Visit (INDEPENDENT_AMBULATORY_CARE_PROVIDER_SITE_OTHER): Payer: Medicare HMO | Admitting: *Deleted

## 2019-02-01 DIAGNOSIS — I428 Other cardiomyopathies: Secondary | ICD-10-CM

## 2019-02-01 DIAGNOSIS — I4901 Ventricular fibrillation: Secondary | ICD-10-CM | POA: Diagnosis not present

## 2019-02-01 LAB — CUP PACEART REMOTE DEVICE CHECK
Battery Remaining Longevity: 30 mo
Battery Remaining Percentage: 35 %
Battery Voltage: 2.96 V
Date Time Interrogation Session: 20200608060014
HighPow Impedance: 39 Ohm
HighPow Impedance: 39 Ohm
Implantable Lead Implant Date: 20090618
Implantable Lead Implant Date: 20090618
Implantable Lead Location: 753858
Implantable Lead Location: 753860
Implantable Lead Model: 7120
Implantable Pulse Generator Implant Date: 20160111
Lead Channel Impedance Value: 280 Ohm
Lead Channel Impedance Value: 400 Ohm
Lead Channel Pacing Threshold Amplitude: 0.375 V
Lead Channel Pacing Threshold Amplitude: 0.5 V
Lead Channel Pacing Threshold Pulse Width: 0.5 ms
Lead Channel Pacing Threshold Pulse Width: 0.5 ms
Lead Channel Sensing Intrinsic Amplitude: 11.7 mV
Lead Channel Setting Pacing Amplitude: 2 V
Lead Channel Setting Pacing Amplitude: 2 V
Lead Channel Setting Pacing Pulse Width: 0.5 ms
Lead Channel Setting Pacing Pulse Width: 0.5 ms
Lead Channel Setting Sensing Sensitivity: 0.5 mV
Pulse Gen Serial Number: 7219979

## 2019-02-08 ENCOUNTER — Other Ambulatory Visit: Payer: Self-pay | Admitting: Internal Medicine

## 2019-02-08 MED ORDER — CARVEDILOL 3.125 MG PO TABS: 3.1250 mg | ORAL_TABLET | Freq: Two times a day (BID) | ORAL | 3 refills | Status: AC

## 2019-02-08 NOTE — Telephone Encounter (Signed)
Pt's medication was sent to pt's pharmacy as requested. Confirmation received.  °

## 2019-02-09 NOTE — Progress Notes (Signed)
Remote ICD transmission.   

## 2019-02-24 ENCOUNTER — Other Ambulatory Visit: Payer: Self-pay | Admitting: Internal Medicine

## 2019-02-24 MED ORDER — AMIODARONE HCL 200 MG PO TABS: 200.0000 mg | ORAL_TABLET | Freq: Every day | ORAL | 3 refills | Status: DC

## 2019-02-24 NOTE — Telephone Encounter (Signed)
Pt's husband calling stating that pt was prescribed Amiodarone 200 mg tablet in the hospital. Pt needs a refill, but husband stated that the Rx stated that pt takes this medication for 30 days. Pt's husband would like a call back concerning this matter. Please address

## 2019-03-22 ENCOUNTER — Telehealth: Payer: Self-pay | Admitting: Internal Medicine

## 2019-03-22 NOTE — Telephone Encounter (Signed)

## 2019-03-23 ENCOUNTER — Other Ambulatory Visit: Payer: Self-pay

## 2019-03-23 ENCOUNTER — Ambulatory Visit (INDEPENDENT_AMBULATORY_CARE_PROVIDER_SITE_OTHER): Payer: Medicare HMO | Admitting: Internal Medicine

## 2019-03-23 ENCOUNTER — Encounter: Payer: Self-pay | Admitting: Internal Medicine

## 2019-03-23 VITALS — BP 102/64 | HR 72 | Ht 66.0 in | Wt 135.5 lb

## 2019-03-23 DIAGNOSIS — I5022 Chronic systolic (congestive) heart failure: Secondary | ICD-10-CM

## 2019-03-23 DIAGNOSIS — I472 Ventricular tachycardia, unspecified: Secondary | ICD-10-CM

## 2019-03-23 NOTE — Progress Notes (Signed)
HPI Mrs. Buccellato returns today for ongoing evaluation and management of VT, CHB, chronic atrial fib and chronic systolic heart failure. She had Fournier gangrene and underwent surgery a couple of months ago. Since her last visit, she has improved. She has had outpatient PT. She is able to walk with a cane. She denies chest pain or sob. No edema. Her appetite is improving.  No Known Allergies   Current Outpatient Medications  Medication Sig Dispense Refill  . amiodarone (PACERONE) 200 MG tablet Take 1 tablet (200 mg total) by mouth daily. 90 tablet 3  . carvedilol (COREG) 3.125 MG tablet Take 1 tablet (3.125 mg total) by mouth 2 (two) times daily with a meal. 180 tablet 3  . midodrine (PROAMATINE) 2.5 MG tablet Take 2.5 mg by mouth 2 (two) times a day.    . warfarin (COUMADIN) 1 MG tablet Take 5 mg by mouth daily.    . potassium chloride (K-DUR) 10 MEQ tablet Take 1 tablet (10 mEq total) by mouth daily for 30 days. 30 tablet 0   No current facility-administered medications for this visit.      Past Medical History:  Diagnosis Date  . Abnormality of gait 08/23/2014  . Arthritis   . Atrial fibrillation ([place])    A.  Chronic Coumadin  . Cardiac arrest - ventricular fibrillation    A.  12/2007;  B. 01/2008 St. Jude Promote Bi-V ICD placed  . CVA (cerebral vascular accident) ([place])   . Diverticular disease   . DVT (deep venous thrombosis) ([place])   . 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 History of colon cancer    A.  1999 - T3, N1  chemotherapy  . Internal hemorrhoids   . Memory deficit 08/23/2014  . Nonischemic cardiomyopathy ([place])    a.  11/2001 - Cath - NL Cors; b. ECHO 09/22/11: EF 20%, MVR normal, moderate LAE, mild RAE c. ECHO (08/2012): ED 20%, diff HK, LA mod dilated, mild/mod TR, RV mild/mod decreased sys fx  . Rectal bleeding 04/28/2017  . Rheumatic heart disease    A. 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 Sick  sinus syndrome ([place])    A.  s/p pacer in 1995.  B.    . Systolic CHF, chronic ([place])    A.  01/2008 Echo - EF 10-20%  . UTI (urinary tract infection) 01/19/2019    ROS:   All systems reviewed and negative except as noted in the HPI.   Past Surgical History:  Procedure Laterality Date  . ABDOMINAL HYSTERECTOMY    . AV NODE ABLATION N/A 09/23/2011   Procedure: AV NODE ABLATION;  Surgeon: Evans Lance, MD;  Location[facility] C[facility] LAB;  Service: Cardiovascular;  Laterality: N/A;  . BIV ICD GENERTAOR CHANGE OUT N/A 09/05/2014   Procedure: BIV ICD GENERTAOR CHANGE OUT;  Surgeon: Evans Lance, MD;  Location: [facility] CATH LAB;  Service: Cardiovascular;  Laterality: N/A;  . Biventricular AICD    . COLON SURGERY  1999  . History of echocardiogram  2003, 2007, 2009  . INCISION AND DRAINAGE PERIRECTAL ABSCESS Right 11/28/2018   Procedure: IRRIGATION AND DEBRIDEMENT PERIRECTAL ABSCESS;  Surgeon: Greer Pickerel, MD;  Location: [facility];  Service: General;  Laterality: Right;  . Mitral Valve Replacement, Bjork-Shiley valve  1983     Family History  Problem Relation Age of Onset  . Diabetes Mother 23  .  Coronary artery disease Mother        s/p cabg  . Lung cancer Father 36       smoker  . Coronary artery disease Sister        s/p cabg     Social History   Socioeconomic History  . Marital status: Married    Spouse name: Not on file  . Number of children: 1  . Years of education: Not on file  . Highest education level: Not on file  Occupational History  . Occupation: Retired    Fish farm manager: RETIRED  Social Needs  . Financial resource strain: Not on file  . Food insecurity    Worry: Not on file    Inability: Not on file  . Transportation needs    Medical: Not on file    Non-medical: Not on file  Tobacco Use  . Smoking status: Never Smoker  . Smokeless tobacco: Never Used  Substance and Sexual Activity  . Alcohol use: No  . Drug use: No  . Sexual activity: Not Currently  Lifestyle  .  Physical activity    Days per week: Not on file    Minutes per session: Not on file  . Stress: Not on file  Relationships  . Social Herbalist on phone: Not on file    Gets together: Not on file    Attends religious service: Not on file    Active member of club or organization: Not on file    Attends meetings of clubs or organizations: Not on file    Relationship status: Not on file  . Intimate partner violence    Fear of current or ex partner: Not on file    Emotionally abused: Not on file    Physically abused: Not on file    Forced sexual activity: Not on file  Other Topics Concern  . Not on file  Social History Narrative   The patient is married and has 1 child.  Lives in [place] with husband.  She is retired from working at [employer].  She does not smoke or drink     BP 102/64   Pulse 72   Ht 5\' 6"  (1.676 m)   Wt 135 lb 8 oz (61.5 kg)   SpO2 95%   BMI 21.87 kg/m   Physical Exam:  Chronically ill appearing 73 yo woman, NAD HEENT: Unremarkable Neck:  No JVD, no thyromegally Lymphatics:  No adenopathy Back:  No CVA tenderness Lungs:  Clear HEART:  Regular rate rhythm, with mechanical S1. Abd:  soft, positive bowel sounds, no organomegally, no rebound, no guarding Ext:  2 plus pulses, no edema, no cyanosis, no clubbing Skin:  No rashes no nodules Neuro:  CN II through XII intact, motor grossly intact  EKG - atrial fib with biv pacing  DEVICE  Normal device function.  See PaceArt for details.   Assess/Plan: 1. Atrial fib - her rates are well controlled. 2. VT - she has not had any additional VT since her last visit. She will continue amiodarone 200 mg daily. 3. Chronic systolic heart failure - she is class 2 and as she has become more sedentary, has no dyspnea with exertion. 4. ICD - her [place] ICD is working normally.  Mikle Bosworth.D.

## 2019-03-23 NOTE — Patient Instructions (Addendum)
Medication Instructions:  Your physician recommends that you continue on your current medications as directed. Please refer to the Current Medication list given to you today.  Labwork: None ordered.  Testing/Procedures: None ordered.  Follow-Up: Your physician wants you to follow-up in: 6 months with Dr. Lovena Le.   You will receive a reminder letter in the mail two months in advance. If you don't receive a letter, please call our office to schedule the follow-up appointment.  Remote monitoring is used to monitor your ICD from home. This monitoring reduces the number of office visits required to check your device to one time per year. It allows [place] to keep an eye on the functioning of your device to ensure it is working properly. You are scheduled for a device check from home on 05/04/2019. You may send your transmission at any time that day. If you have a wireless device, the transmission will be sent automatically. After your physician reviews your transmission, you will receive a postcard with your next transmission date.  Any Other Special Instructions Will Be Listed Below (If Applicable).  If you need a refill on your cardiac medications before your next appointment, please call your pharmacy.

## 2019-04-09 LAB — CUP PACEART INCLINIC DEVICE CHECK
Date Time Interrogation Session: 20200814155227
Implantable Lead Implant Date: 20090618
Implantable Lead Implant Date: 20090618
Implantable Lead Location: 753858
Implantable Lead Location: 753860
Implantable Lead Model: 7120
Implantable Pulse Generator Implant Date: 20160111
Pulse Gen Serial Number: 7219979

## 2019-04-29 ENCOUNTER — Other Ambulatory Visit: Payer: Self-pay | Admitting: Internal Medicine

## 2019-05-04 ENCOUNTER — Ambulatory Visit (INDEPENDENT_AMBULATORY_CARE_PROVIDER_SITE_OTHER): Payer: Medicare HMO | Admitting: *Deleted

## 2019-05-04 DIAGNOSIS — I428 Other cardiomyopathies: Secondary | ICD-10-CM

## 2019-05-04 DIAGNOSIS — I5022 Chronic systolic (congestive) heart failure: Secondary | ICD-10-CM

## 2019-05-04 LAB — CUP PACEART REMOTE DEVICE CHECK
Battery Remaining Longevity: 28 mo
Battery Remaining Percentage: 33 %
Battery Voltage: 2.95 V
Date Time Interrogation Session: 20200907060026
HighPow Impedance: 44 Ohm
HighPow Impedance: 44 Ohm
Implantable Lead Implant Date: 20090618
Implantable Lead Implant Date: 20090618
Implantable Lead Location: 753858
Implantable Lead Location: 753860
Implantable Lead Model: 7120
Implantable Pulse Generator Implant Date: 20160111
Lead Channel Impedance Value: 330 Ohm
Lead Channel Impedance Value: 460 Ohm
Lead Channel Pacing Threshold Amplitude: 0.375 V
Lead Channel Pacing Threshold Amplitude: 0.75 V
Lead Channel Pacing Threshold Pulse Width: 0.5 ms
Lead Channel Pacing Threshold Pulse Width: 0.5 ms
Lead Channel Sensing Intrinsic Amplitude: 12 mV
Lead Channel Setting Pacing Amplitude: 2 V
Lead Channel Setting Pacing Amplitude: 2 V
Lead Channel Setting Pacing Pulse Width: 0.5 ms
Lead Channel Setting Pacing Pulse Width: 0.5 ms
Lead Channel Setting Sensing Sensitivity: 0.5 mV
Pulse Gen Serial Number: 7219979

## 2019-05-19 ENCOUNTER — Encounter: Payer: Self-pay | Admitting: Cardiology

## 2019-05-19 NOTE — Progress Notes (Signed)
Remote ICD transmission.   

## 2019-08-02 LAB — CUP PACEART REMOTE DEVICE CHECK
Battery Remaining Longevity: 25 mo
Battery Remaining Percentage: 29 %
Battery Voltage: 2.93 V
Date Time Interrogation Session: 20201207020014
HighPow Impedance: 46 Ohm
HighPow Impedance: 46 Ohm
Implantable Lead Implant Date: 20090618
Implantable Lead Implant Date: 20090618
Implantable Lead Location: 753858
Implantable Lead Location: 753860
Implantable Lead Model: 7120
Implantable Pulse Generator Implant Date: 20160111
Lead Channel Impedance Value: 310 Ohm
Lead Channel Impedance Value: 440 Ohm
Lead Channel Pacing Threshold Amplitude: 0.375 V
Lead Channel Pacing Threshold Amplitude: 0.75 V
Lead Channel Pacing Threshold Pulse Width: 0.5 ms
Lead Channel Pacing Threshold Pulse Width: 0.5 ms
Lead Channel Sensing Intrinsic Amplitude: 12 mV
Lead Channel Setting Pacing Amplitude: 2 V
Lead Channel Setting Pacing Amplitude: 2 V
Lead Channel Setting Pacing Pulse Width: 0.5 ms
Lead Channel Setting Pacing Pulse Width: 0.5 ms
Lead Channel Setting Sensing Sensitivity: 0.5 mV
Pulse Gen Serial Number: 7219979

## 2019-08-03 ENCOUNTER — Ambulatory Visit (INDEPENDENT_AMBULATORY_CARE_PROVIDER_SITE_OTHER): Payer: Medicare HMO | Admitting: *Deleted

## 2019-08-03 DIAGNOSIS — I428 Other cardiomyopathies: Secondary | ICD-10-CM

## 2019-09-03 NOTE — Progress Notes (Signed)
ICD remote 

## 2019-09-29 ENCOUNTER — Emergency Department (HOSPITAL_COMMUNITY)
Admission: EM | Admit: 2019-09-29 | Discharge: 2019-09-29 | Disposition: A | Discharge status: Home / Self Care | Payer: Medicare Other | Source: Home / Self Care | Attending: Emergency Medicine | Admitting: Emergency Medicine

## 2019-09-29 ENCOUNTER — Other Ambulatory Visit: Payer: Self-pay

## 2019-09-29 ENCOUNTER — Emergency Department (HOSPITAL_COMMUNITY): Payer: Medicare Other

## 2019-09-29 ENCOUNTER — Encounter (HOSPITAL_COMMUNITY): Payer: Self-pay | Admitting: Emergency Medicine

## 2019-09-29 DIAGNOSIS — R0602 Shortness of breath: Secondary | ICD-10-CM | POA: Insufficient documentation

## 2019-09-29 DIAGNOSIS — Z7901 Long term (current) use of anticoagulants: Secondary | ICD-10-CM | POA: Insufficient documentation

## 2019-09-29 DIAGNOSIS — Z85038 Personal history of other malignant neoplasm of large intestine: Secondary | ICD-10-CM | POA: Insufficient documentation

## 2019-09-29 DIAGNOSIS — Z20822 Contact with and (suspected) exposure to covid-19: Secondary | ICD-10-CM | POA: Insufficient documentation

## 2019-09-29 DIAGNOSIS — F0390 Unspecified dementia without behavioral disturbance: Secondary | ICD-10-CM | POA: Insufficient documentation

## 2019-09-29 DIAGNOSIS — Z79899 Other long term (current) drug therapy: Secondary | ICD-10-CM | POA: Insufficient documentation

## 2019-09-29 DIAGNOSIS — I5042 Chronic combined systolic (congestive) and diastolic (congestive) heart failure: Secondary | ICD-10-CM | POA: Insufficient documentation

## 2019-09-29 DIAGNOSIS — R0789 Other chest pain: Secondary | ICD-10-CM | POA: Insufficient documentation

## 2019-09-29 DIAGNOSIS — Z95810 Presence of automatic (implantable) cardiac defibrillator: Secondary | ICD-10-CM | POA: Insufficient documentation

## 2019-09-29 LAB — CBC WITH DIFFERENTIAL/PLATELET
Abs Immature Granulocytes: 0.05 10*3/uL (ref 0.00–0.07)
Basophils Absolute: 0.1 10*3/uL (ref 0.0–0.1)
Basophils Relative: 1 %
Eosinophils Absolute: 0.1 10*3/uL (ref 0.0–0.5)
Eosinophils Relative: 2 %
HCT: 48.8 % — ABNORMAL HIGH (ref 36.0–46.0)
Hemoglobin: 14.9 g/dL (ref 12.0–15.0)
Immature Granulocytes: 1 %
Lymphocytes Relative: 20 %
Lymphs Abs: 1.6 10*3/uL (ref 0.7–4.0)
MCH: 29.8 pg (ref 26.0–34.0)
MCHC: 30.5 g/dL (ref 30.0–36.0)
MCV: 97.6 fL (ref 80.0–100.0)
Monocytes Absolute: 0.4 10*3/uL (ref 0.1–1.0)
Monocytes Relative: 5 %
Neutro Abs: 6 10*3/uL (ref 1.7–7.7)
Neutrophils Relative %: 71 %
Platelets: 164 10*3/uL (ref 150–400)
RBC: 5 MIL/uL (ref 3.87–5.11)
RDW: 16.1 % — ABNORMAL HIGH (ref 11.5–15.5)
WBC: 8.3 10*3/uL (ref 4.0–10.5)
nRBC: 0 % (ref 0.0–0.2)

## 2019-09-29 LAB — BRAIN NATRIURETIC PEPTIDE: B Natriuretic Peptide: 611.2 pg/mL — ABNORMAL HIGH (ref 0.0–100.0)

## 2019-09-29 LAB — BASIC METABOLIC PANEL
Anion gap: 12 (ref 5–15)
BUN: 19 mg/dL (ref 8–23)
CO2: 23 mmol/L (ref 22–32)
Calcium: 9.5 mg/dL (ref 8.9–10.3)
Chloride: 104 mmol/L (ref 98–111)
Creatinine, Ser: 0.96 mg/dL (ref 0.44–1.00)
GFR calc Af Amer: >60 mL/min (ref >60)
GFR calc non Af Amer: 58 mL/min — ABNORMAL LOW (ref >60)
Glucose, Bld: 160 mg/dL — ABNORMAL HIGH (ref 70–99)
Potassium: 5.8 mmol/L — ABNORMAL HIGH (ref 3.5–5.1)
Sodium: 139 mmol/L (ref 135–145)

## 2019-09-29 LAB — RESPIRATORY PANEL BY RT PCR (FLU A&B, COVID)
Influenza A by PCR: NEGATIVE
Influenza B by PCR: NEGATIVE
SARS Coronavirus 2 by RT PCR: NEGATIVE

## 2019-09-29 LAB — TROPONIN I (HIGH SENSITIVITY)
Troponin I (High Sensitivity): 176 ng/L (ref <18)
Troponin I (High Sensitivity): 163 ng/L (ref <18)

## 2019-09-29 LAB — PROTIME-INR
INR: 1.2 (ref 0.8–1.2)
Prothrombin Time: 15 seconds (ref 11.4–15.2)

## 2019-09-29 LAB — MAGNESIUM: Magnesium: 2.3 mg/dL (ref 1.7–2.4)

## 2019-09-29 MED ORDER — AMIODARONE HCL IN DEXTROSE 360-4.14 MG/200ML-% IV SOLN
30.0000 mg/h | INTRAVENOUS | Status: DC
  Administered 2019-09-29: 30 mg/h via INTRAVENOUS

## 2019-09-29 MED ORDER — ACETAMINOPHEN 325 MG PO TABS
650.0000 mg | ORAL_TABLET | Freq: Once | ORAL | Status: AC
  Administered 2019-09-29: 650 mg via ORAL
  Filled 2019-09-29: qty 2

## 2019-09-29 MED ORDER — ENOXAPARIN SODIUM 60 MG/0.6ML ~~LOC~~ SOLN
60.0000 mg | Freq: Two times a day (BID) | SUBCUTANEOUS | Status: DC
  Administered 2019-09-29: 60 mg via SUBCUTANEOUS
  Filled 2019-09-29 (×2): qty 0.6

## 2019-09-29 MED ORDER — WARFARIN - PHARMACIST DOSING INPATIENT: Freq: Every day | Status: DC

## 2019-09-29 MED ORDER — HEPARIN (PORCINE) 25000 UT/250ML-% IV SOLN: 1000.0000 [IU]/h | INTRAVENOUS | Status: DC

## 2019-09-29 MED ORDER — AMIODARONE HCL IN DEXTROSE 360-4.14 MG/200ML-% IV SOLN
60.0000 mg/h | INTRAVENOUS | Status: DC
  Filled 2019-09-29: qty 200

## 2019-09-29 MED ORDER — WARFARIN SODIUM 10 MG PO TABS
10.0000 mg | ORAL_TABLET | Freq: Once | ORAL | Status: AC
  Administered 2019-09-29: 07:00:00 10 mg via ORAL
  Filled 2019-09-29: qty 1

## 2019-09-29 MED ORDER — HEPARIN BOLUS VIA INFUSION
4000.0000 [IU] | Freq: Once | INTRAVENOUS | Status: DC
  Filled 2019-09-29: qty 4000

## 2019-09-29 NOTE — Discharge Planning (Signed)
Clinical Social Work is seeking post-discharge placement for this patient at the following level of care: SNF .  Per PT, family may refuse SNF due to location; [facility] arranged [place] services with [facility] should pt go home with [facility] services.

## 2019-09-29 NOTE — Discharge Planning (Signed)
EDCM to follow for disposition needs.  

## 2019-09-29 NOTE — ED Notes (Signed)
Amiodarone drip started at 60 mg/hr x6 hrs. , respirations unlabored , IV site unremarkable , patient complains of intermittent pain across her chest . HR= 74/min irregular .

## 2019-09-29 NOTE — Care Management (Signed)
ED CM faxed Referral to [facility] agency.

## 2019-09-29 NOTE — Progress Notes (Signed)
ANTICOAGULATION CONSULT NOTE - Initial Consult  Pharmacy Consult for Heparin in setting of low INR Indication: atrial fibrillation  No Known Allergies   Vital Signs: Temp: 97.7 F (36.5 C) (02/03 0126) Temp Source: Oral (02/03 0126) BP: 126/76 (02/03 0445) Pulse Rate: 40 (02/03 0445)  Labs: Recent Labs    09/29/19 0131 09/29/19 0402  HGB 14.9  --   HCT 48.8*  --   PLT 164  --   LABPROT 15.0  --   INR 1.2  --   CREATININE 0.96  --   TROPONINIHS 176* 163*    CrCl cannot be calculated (Unknown ideal weight.).   Medical History: Past Medical History:  Diagnosis Date  . Abnormality of gait 08/23/2014  . Arthritis   . Atrial fibrillation ([place])    A.  Chronic Coumadin  . Cardiac arrest - ventricular fibrillation    A.  12/2007;  B. 01/2008 St. Jude Promote Bi-V ICD placed  . CVA (cerebral vascular accident) ([place])   . Diverticular disease   . DVT (deep venous thrombosis) ([place])   . 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 History of colon cancer    A.  1999 - T3, N1  chemotherapy  . Internal hemorrhoids   . Memory deficit 08/23/2014  . Nonischemic cardiomyopathy ([place])    a.  11/2001 - Cath - NL Cors; b. ECHO 09/22/11: EF 20%, MVR normal, moderate LAE, mild RAE c. ECHO (08/2012): ED 20%, diff HK, LA mod dilated, mild/mod TR, RV mild/mod decreased sys fx  . Rectal bleeding 04/28/2017  . Rheumatic heart disease    A. 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 Sick sinus syndrome ([place])    A.  s/p pacer in 1995.  B.    . Systolic CHF, chronic ([place])    A.  01/2008 Echo - EF 10-20%  . UTI (urinary tract infection) 01/19/2019    Assessment: -Initial plan was to start heparin in setting of low INR (1.2), along with amio drip  -Amio/heparin on hold (see MAR), pt was going to be discharged  -Pt with some decline, now unsure about discharge  Goal of Therapy:  Heparin level 0.3-0.7 units/ml Monitor platelets by  anticoagulation protocol: Yes   Plan:  -Please f/u plan (admit vs DC) and start heparin if desired by EDP or admitting MD  Narda Bonds, PharmD, BCPS Clinical Pharmacist Phone: [phone]

## 2019-09-29 NOTE — Discharge Instructions (Signed)
Follow up with your doctor. Return for concern of safety at home.

## 2019-09-29 NOTE — ED Provider Notes (Signed)
Received patient in signout from Dr. Betsey Holiday.  Some concern for cardiac arrhythmia on arrival patient was started on amnio drip.  Evaluated by cardiology and felt to be at their baseline.  Chronically was on amnio had the drip stopped and was cleared from their standpoint.  Patient began to have some cognitive impairments consistent with sundowning.  Some concern about her not being safe at home and social work was consulted for evaluation.  Social work is seen the patient today and has talked with the family and is working on finding placement.  Son after thinking about it decided he did not want her placed into a facility.  Will take her home with home health.  Discharge home.     Durable Medical Equipment  (From admission, onward)         Start     Ordered   09/29/19 1525  For home use only DME lightweight manual wheelchair with seat cushion  Once    Comments: Patient suffers from weakness which impairs their ability to perform daily activities like bathing, dressing, feeding, grooming and toileting in the home.  A cane, crutch or walker will not resolve  issue with performing activities of daily living. A wheelchair will allow patient to safely perform daily activities. Patient is not able to propel themselves in the home using a standard weight wheelchair due to endurance and general weakness. Patient can self propel in the lightweight wheelchair. Length of need 12 months . Accessories: elevating leg rests (ELRs), wheel locks, extensions and anti-tippers.   09/29/19 [place], [place], DO 09/29/19 1527

## 2019-09-29 NOTE — ED Notes (Signed)
Pt son updated about POC. Pt just waiting for SW consult. Son will like to be call after consult is completed.

## 2019-09-29 NOTE — ED Notes (Signed)
Amiodarone drip started at 60 mg/hr x 6 hours , IV site intact , respirations unlabored , HR= 72/min irregular .

## 2019-09-29 NOTE — NC FL2 (Signed)
[place] LEVEL OF CARE SCREENING TOOL     IDENTIFICATION  Patient Name: Angela [name] Birthdate: 1946-01-14 Sex: female Admission Date (Current Location): 09/29/2019  [facility] and [place] Number:  Herbalist and Address:  [facility], [place] [address] [place], [place] [address]      Provider Number: [id number]  Attending Physician Name and Address:  Orpah Greek, *  Relative Name and Phone Number:  Rainelle Leonelli, [phone]    Current Level of Care: Hospital Recommended Level of Care: [place] Prior Approval Number:    Date Approved/Denied: 02/17/08 PASRR Number: [id number]  Discharge Plan: SNF    Current Diagnoses: Patient Active Problem List   Diagnosis Date Noted  . Acute on chronic combined systolic and diastolic CHF (congestive heart failure) ([place]) 01/22/2019  . Perianal pain   . Acute metabolic encephalopathy [id number]  . UTI (urinary tract infection) 01/14/2019  . Protein-calorie malnutrition, severe 12/08/2018  . Goals of care, counseling/discussion   . 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 Syncope 11/26/2018  . Ventricular tachycardia ([place]) 11/26/2018  . Elevated INR 11/26/2018  . Acute on chronic systolic (congestive) heart failure ([place]) 10/27/2018  . Elevated troponin 10/26/2018  . Anemia 10/26/2018  . Palliative care by specialist   . Rectal bleeding 04/28/2017  . Acute blood loss anemia 04/28/2017  . ICD (implantable cardioverter-defibrillator), biventricular, in situ 08/24/2014  . Memory deficit 08/23/2014  . Abnormality of gait 08/23/2014  . Dementia ([place]) 12/17/2013  . 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 Hypotension due to drugs 07/12/2012  . Hypokalemia 07/12/2012  . NSVT (nonsustained ventricular tachycardia) ([place]) 07/12/2012  . Acute on chronic systolic heart failure ([place]) 07/07/2012  . Ventricular fibrillation ([place]) 11/11/2011  . Warfarin  anticoagulation 09/22/2011  . Nonischemic cardiomyopathy ([place])   . Atrial fibrillation ([place])   . History of colon cancer   . Internal hemorrhoids   . Rheumatic heart disease   . Sick sinus syndrome ([place])   . Systolic CHF, chronic ([place])   . DIVERTICULAR DISEASE 06/05/2009    Orientation RESPIRATION BLADDER Height & Weight     Self, Place  Normal Continent Weight:  135lbs Height:   16ft 6in  BEHAVIORAL SYMPTOMS/MOOD NEUROLOGICAL BOWEL NUTRITION STATUS      Continent Diet  AMBULATORY STATUS COMMUNICATION OF NEEDS Skin   Extensive Assist Verbally Normal                       Personal Care Assistance Level of Assistance  Bathing, Dressing, Feeding Bathing Assistance: Maximum assistance Feeding assistance: Limited assistance Dressing Assistance: Maximum assistance     Functional Limitations Info  Speech, Sight, Hearing Sight Info: Adequate Hearing Info: Adequate Speech Info: Adequate    SPECIAL CARE FACTORS FREQUENCY  PT (By licensed PT), OT (By licensed OT)     PT Frequency: 5x weekly OT Frequency: 5x weekly            Contractures      Additional Factors Info                  Current Medications (09/29/2019):  This is the current hospital active medication list Current Facility-Administered Medications  Medication Dose Route Frequency Provider Last Rate Last Admin  . enoxaparin (LOVENOX) injection 60 mg  60 mg Subcutaneous Q12H Deno Etienne, DO   60 mg at 09/29/19 1022  . [START ON 09/30/2019] Warfarin - Pharmacist Dosing Inpatient  Does not apply q1800 Deno Etienne, DO       Current Outpatient Medications  Medication Sig Dispense Refill  . alendronate (FOSAMAX) 70 MG tablet Take 70 mg by mouth once a week.    . carvedilol (COREG) 3.125 MG tablet Take 1 tablet (3.125 mg total) by mouth 2 (two) times daily with a meal. 180 tablet 3  . digoxin (LANOXIN) 0.125 MG tablet Take 125 mcg by mouth daily.    . furosemide (LASIX) 20 MG tablet Take 20 mg by mouth  daily.    . midodrine (PROAMATINE) 10 MG tablet Take 10 mg by mouth 2 (two) times daily.     . Multiple Vitamins-Minerals (AIRBORNE GUMMIES PO) Take 1 tablet by mouth daily.    [name] tiZANidine (ZANAFLEX) 4 MG tablet Take 4 mg by mouth daily as needed for muscle spasms.     [name] warfarin (COUMADIN) 1 MG tablet Take 4 mg by mouth See admin instructions. Take 4 tablets (4mg ) by mouth all days except on Tues and Thurs    . warfarin (COUMADIN) 5 MG tablet Take 5 mg by mouth See admin instructions. Take 1 tablet (5mg ) by mouth on Tues and Thurs    . amiodarone (PACERONE) 200 MG tablet Take 1 tablet (200 mg total) by mouth daily. (Patient not taking: Reported on 09/29/2019) 90 tablet 3  . potassium chloride (K-DUR) 10 MEQ tablet Take 1 tablet (10 mEq total) by mouth daily for 30 days. (Patient not taking: Reported on 09/29/2019) 30 tablet 0     Discharge Medications: Please see discharge summary for a list of discharge medications.  Relevant Imaging Results:  Relevant Lab Results:   Additional Information SSN: [id number]  Archie Endo, LCSW

## 2019-09-29 NOTE — ED Notes (Signed)
[facility]) Tel: [phone] .

## 2019-09-29 NOTE — ED Provider Notes (Signed)
[place] EMERGENCY DEPARTMENT Provider Note   CSN: [id number] Arrival date & time: 09/29/19  0120     History Chief Complaint  Patient presents with  . Chest Pain    Vtach    Angela [name] is a 74 y.o. female.  Patient presents to the emergency department for evaluation of chest pain.  Patient reports that she got up to go to the bathroom and noticed she was having pains in the left chest.  Patient reports that she has been having a sensation of her defibrillator going off.  She comes to the ER from home by EMS.  EMS report that they have identified multiple nonsustained runs of V. tach as well as V. tach that has been shocked by her defibrillator during transport.  She does report some mild shortness of breath.        Past Medical History:  Diagnosis Date  . Abnormality of gait 08/23/2014  . Arthritis   . Atrial fibrillation ([place])    A.  Chronic Coumadin  . Cardiac arrest - ventricular fibrillation    A.  12/2007;  B. 01/2008 St. Jude Promote Bi-V ICD placed  . CVA (cerebral vascular accident) ([place])   . Diverticular disease   . DVT (deep venous thrombosis) ([place])   . 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 History of colon cancer    A.  1999 - T3, N1  chemotherapy  . Internal hemorrhoids   . Memory deficit 08/23/2014  . Nonischemic cardiomyopathy ([place])    a.  11/2001 - Cath - NL Cors; b. ECHO 09/22/11: EF 20%, MVR normal, moderate LAE, mild RAE c. ECHO (08/2012): ED 20%, diff HK, LA mod dilated, mild/mod TR, RV mild/mod decreased sys fx  . Rectal bleeding 04/28/2017  . Rheumatic heart disease    A. 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 Sick sinus syndrome ([place])    A.  s/p pacer in 1995.  B.    . Systolic CHF, chronic ([place])    A.  01/2008 Echo - EF 10-20%  . UTI (urinary tract infection) 01/19/2019    Patient Active Problem List   Diagnosis Date Noted  . Acute on chronic combined systolic and  diastolic CHF (congestive heart failure) ([place]) 01/22/2019  . Perianal pain   . Acute metabolic encephalopathy [id number]  . UTI (urinary tract infection) 01/14/2019  . Protein-calorie malnutrition, severe 12/08/2018  . Goals of care, counseling/discussion   . 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 Syncope 11/26/2018  . Ventricular tachycardia ([place]) 11/26/2018  . Elevated INR 11/26/2018  . Acute on chronic systolic (congestive) heart failure ([place]) 10/27/2018  . Elevated troponin 10/26/2018  . Anemia 10/26/2018  . Palliative care by specialist   . Rectal bleeding 04/28/2017  . Acute blood loss anemia 04/28/2017  . ICD (implantable cardioverter-defibrillator), biventricular, in situ 08/24/2014  . Memory deficit 08/23/2014  . Abnormality of gait 08/23/2014  . Dementia ([place]) 12/17/2013  . 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 Hypotension due to drugs 07/12/2012  . Hypokalemia 07/12/2012  . NSVT (nonsustained ventricular tachycardia) ([place]) 07/12/2012  . Acute on chronic systolic heart failure ([place]) 07/07/2012  . Ventricular fibrillation ([place]) 11/11/2011  . Warfarin anticoagulation 09/22/2011  . Nonischemic cardiomyopathy ([place])   . Atrial fibrillation ([place])   . History of colon cancer   . Internal hemorrhoids   . Rheumatic heart disease   .  Sick sinus syndrome ([place])   . Systolic CHF, chronic ([place])   . DIVERTICULAR DISEASE 06/05/2009    Past Surgical History:  Procedure Laterality Date  . ABDOMINAL HYSTERECTOMY    . AV NODE ABLATION N/A 09/23/2011   Procedure: AV NODE ABLATION;  Surgeon: Evans Lance, MD;  Location: [facility] CATH LAB;  Service: Cardiovascular;  Laterality: N/A;  . BIV ICD GENERTAOR CHANGE OUT N/A 09/05/2014   Procedure: BIV ICD GENERTAOR CHANGE OUT;  Surgeon: Evans Lance, MD;  Location: [facility] CATH LAB;  Service: Cardiovascular;  Laterality: N/A;  . Biventricular AICD    . COLON SURGERY  1999  . History of echocardiogram  2003, 2007, 2009  . INCISION  AND DRAINAGE PERIRECTAL ABSCESS Right 11/28/2018   Procedure: IRRIGATION AND DEBRIDEMENT PERIRECTAL ABSCESS;  Surgeon: Greer Pickerel, MD;  Location: [facility];  Service: General;  Laterality: Right;  . Mitral Valve Replacement, Bjork-Shiley valve  1983     OB History   No obstetric history on file.     Family History  Problem Relation Age of Onset  . Diabetes Mother 7  . Coronary artery disease Mother        s/p cabg  . Lung cancer Father 25       smoker  . Coronary artery disease Sister        s/p cabg    Social History   Tobacco Use  . Smoking status: Never Smoker  . Smokeless tobacco: Never Used  Substance Use Topics  . Alcohol use: No  . Drug use: No    Home Medications Prior to Admission medications   Medication Sig Start Date End Date Taking? Authorizing Provider  amiodarone (PACERONE) 200 MG tablet Take 1 tablet (200 mg total) by mouth daily. 02/24/19   Evans Lance, MD  carvedilol (COREG) 3.125 MG tablet Take 1 tablet (3.125 mg total) by mouth 2 (two) times daily with a meal. 02/08/19   Evans Lance, MD  midodrine (PROAMATINE) 2.5 MG tablet Take 2.5 mg by mouth 2 (two) times a day.    [provider]  potassium chloride (K-DUR) 10 MEQ tablet Take 1 tablet (10 mEq total) by mouth daily for 30 days. 01/23/19 02/22/19  Darliss Cheney, MD  warfarin (COUMADIN) 1 MG tablet Take 5 mg by mouth daily.    [provider]    Allergies    Patient has no known allergies.  Review of Systems   Review of Systems  Respiratory: Positive for shortness of breath.   Cardiovascular: Positive for chest pain.  All other systems reviewed and are negative.   Physical Exam Updated Vital Signs BP 126/76   Pulse (!) 40   Temp 97.7 F (36.5 C) (Oral)   Resp 20   SpO2 100%   Physical Exam Vitals and nursing note reviewed.  Constitutional:      General: She is in acute distress.     Appearance: Normal appearance. She is well-developed.  HENT:     Head:  Normocephalic and atraumatic.     Right Ear: Hearing normal.     Left Ear: Hearing normal.     Nose: Nose normal.  Eyes:     Conjunctiva/sclera: Conjunctivae normal.     Pupils: Pupils are equal, round, and reactive to light.  Cardiovascular:     Rate and Rhythm: Regular rhythm.     Heart sounds: S1 normal and S2 normal. No murmur. No friction rub. No gallop.   Pulmonary:  Effort: Pulmonary effort is normal. No respiratory distress.     Breath sounds: Normal breath sounds.  Chest:     Chest wall: No tenderness.  Abdominal:     General: Bowel sounds are normal.     Palpations: Abdomen is soft.     Tenderness: There is no abdominal tenderness. There is no guarding or rebound. Negative signs include Murphy's sign and McBurney's sign.     Hernia: No hernia is present.  Musculoskeletal:        General: Normal range of motion.     Cervical back: Normal range of motion and neck supple.  Skin:    General: Skin is warm and dry.     Findings: No rash.  Neurological:     Mental Status: She is alert and oriented to person, place, and time.     GCS: GCS eye subscore is 4. GCS verbal subscore is 5. GCS motor subscore is 6.     Cranial Nerves: No cranial nerve deficit.     Sensory: No sensory deficit.     Coordination: Coordination normal.  Psychiatric:        Speech: Speech normal.        Behavior: Behavior normal.        Thought Content: Thought content normal.     ED Results / Procedures / Treatments   Labs (all labs ordered are listed, but only abnormal results are displayed) Labs Reviewed  CBC WITH DIFFERENTIAL/PLATELET - Abnormal; Notable for the following components:      Result Value   HCT 48.8 (*)    RDW 16.1 (*)    All other components within normal limits  BASIC METABOLIC PANEL - Abnormal; Notable for the following components:   Potassium 5.8 (*)    Glucose, Bld 160 (*)    GFR calc non Af Amer 58 (*)    All other components within normal limits  BRAIN NATRIURETIC  PEPTIDE - Abnormal; Notable for the following components:   B Natriuretic Peptide 611.2 (*)    All other components within normal limits  TROPONIN I (HIGH SENSITIVITY) - Abnormal; Notable for the following components:   Troponin I (High Sensitivity) 176 (*)    All other components within normal limits  TROPONIN I (HIGH SENSITIVITY) - Abnormal; Notable for the following components:   Troponin I (High Sensitivity) 163 (*)    All other components within normal limits  RESPIRATORY PANEL BY RT PCR (FLU A&B, COVID)  PROTIME-INR  MAGNESIUM    EKG None  Radiology DG Chest Port 1 View  Result Date: 09/29/2019 CLINICAL DATA:  Chest pain and ventricular tachycardia EXAM: PORTABLE CHEST 1 VIEW COMPARISON:  01/22/2019 FINDINGS: Cardiac shadow remains enlarged. Postsurgical changes as well as a defibrillator are again seen and stable. No focal infiltrate or sizable effusion is seen. No bony abnormality is noted. IMPRESSION: No acute abnormality noted. Electronically Signed   By: Inez Catalina M.D.   On: 09/29/2019 02:07    Procedures Ultrasound ED Peripheral IV (Provider)  Date/Time: 09/29/2019 1:50 AM Performed by: Orpah Greek, MD Authorized by: Orpah Greek, MD   Procedure details:    Indications: multiple failed IV attempts and poor IV access     Skin Prep: chlorhexidine gluconate     Location: right upper arm.   Angiocath:  20 G   Bedside Ultrasound Guided: Yes     Images: not archived     Patient tolerated procedure without complications: Yes     Dressing applied:  Yes     (including critical care time)  Medications Ordered in ED Medications  heparin bolus via infusion 4,000 Units (0 Units Intravenous Hold 09/29/19 0440)  heparin ADULT infusion 100 units/mL (25000 units/267mL sodium chloride 0.45%) (0 Units/hr Intravenous Hold 09/29/19 0440)  warfarin (COUMADIN) tablet 10 mg (has no administration in time range)    ED Course  I have reviewed the triage vital signs  and the nursing notes.  Pertinent labs & imaging results that were available during my care of the patient were reviewed by me and considered in my medical decision making (see chart for details).    MDM Rules/Calculators/A&P                      Patient presents to the emergency department for evaluation of chest pain.  Patient had onset of chest pain when she got up to go to the bathroom tonight.  Reviewing her records reveals that she has a nonischemic cardiomyopathy with ejection fraction of 15 to 20%.  She also has a history of permanent atrial fibrillation, anticoagulation with Coumadin.  Tracings from EMS were reviewed.  She is in atrial fibrillation with frequent PVCs, occasional PVCs and couplets but I do not see any nonsustained or sustained V. tach.  Patient is also chronically on amiodarone.  She has a history of V. fib arrest in the past.  She has a biventricular ICD.  Based on this history, she was initiated on amiodarone at arrival.  She also has a history of atrial fibrillation and is anticoagulated with Coumadin.  She was subtherapeutic so started on heparin.  At arrival she was evaluated.  EKGs that were brought with EMS to show ventricular bigeminy but no evidence of sustained or nonsustained V. tach.  Defibrillator was interrogated.  She has not had any events today.  Defibrillator has not fired.  Cardiology has seen and evaluated the patient.  Patient's elevated troponin is consistent with her chronic elevation.  Cardiology felt that the patient is at her baseline.  She has had presentations in the past similar to this where she thought the defibrillator fired but it had not.  Cardiology did not recommend admission, felt patient could be discharged to home.  Amiodarone was discontinued and heparin was stopped as well.  Patient will be given 10 mg of Coumadin and she will need to have her INR monitored closely by Coumadin clinic.  At this point patient was planned for discharge.   She now, however, reports that she feels very weak and cannot get up.  Nursing staff tried to ambulate her but she was having difficulty.  She reports that she normally walks with a cane.  Cannot be discharged at this time, will obtain case management/social work consultation.  She has been noted to become somewhat confused through the course of the night.  Suspect that there is some undiagnosed dementia.  Might need Geri psych eval as well.   Final Clinical Impression(s) / ED Diagnoses Final diagnoses:  Atypical chest pain    Rx / DC Orders ED Discharge Orders    None       Orpah Greek, MD 09/29/19 [phone]

## 2019-09-29 NOTE — Evaluation (Signed)
Physical Therapy Evaluation Patient Details Name: Angela [name] MRN: [id number] DOB: 1946/07/29 Today's Date: 09/29/2019   History of Present Illness  Pt is a 74 y/o female presenting secondary to chest pain. Per cardiology, likely at her baseline. PMH includes a fib, nonischemic cardiac myopathy, CVA, DVT, and s/p pacemaker.   Clinical Impression  Pt admitted secondary to problem above with deficits below. Pt requiring mod A to stand at EOB; presented with heavy posterior lean and was unable to take steps. Pt also very fearful of falling. Feel pt would benefit from SNF level therapies, however, if pt and family refuses, will require [place] services and DME below. Family will also need to be able to physically assist pt. Will continue to follow acutely to maximize functional mobility independence and safety.     Follow Up Recommendations SNF;Supervision/Assistance - 24 hour(IF pt or family refuses, will require [place] services)    Equipment Recommendations  Wheelchair (measurements PT);Wheelchair cushion (measurements PT)    Recommendations for Other Services       Precautions / Restrictions Precautions Precautions: Fall Restrictions Weight Bearing Restrictions: No      Mobility  Bed Mobility Overal bed mobility: Needs Assistance Bed Mobility: Supine to Sit;Sit to Supine     Supine to sit: Mod assist Sit to supine: Mod assist   General bed mobility comments: Mod A for LE assist and assist to scoot to EOB. Increased time required.   Transfers Overall transfer level: Needs assistance Equipment used: 1 person hand held assist Transfers: Sit to/from Stand Sit to Stand: Mod assist         General transfer comment: Mod A for lift assist and steadying. Pt very fearful of falling and PT had to assure pt she would be assisted. Pt with posterior lean and unable to correct.   Ambulation/Gait                Stairs            Wheelchair Mobility    Modified Rankin (Stroke  Patients Only)       Balance Overall balance assessment: Needs assistance Sitting-balance support: No upper extremity supported;Feet supported Sitting balance-Leahy Scale: Fair     Standing balance support: Bilateral upper extremity supported Standing balance-Leahy Scale: Poor Standing balance comment: posterior lean and required mod A to maintain balance.                              Pertinent Vitals/Pain Pain Assessment: Faces Faces Pain Scale: Hurts even more Pain Location: L ankle Pain Descriptors / Indicators: Aching;Grimacing;Guarding Pain Intervention(s): Monitored during session;Limited activity within patient's tolerance;Repositioned    Home Living Family/patient expects to be discharged to:: Private residence Living Arrangements: Children Available Help at Discharge: Family;Available 24 hours/day Type of Home: House Home Access: Stairs to enter Entrance Stairs-Rails: Psychiatric nurse of Steps: 2 Home Layout: One level Home Equipment: Walker - 2 wheels;Cane - quad;Shower seat Additional Comments: Pt reports husband is at home, however, pt's husband recently passed per RN.     Prior Function Level of Independence: Independent with assistive device(s)         Comments: Pt reports using a cane for ambulation. Unsure of accuracy.      Hand Dominance        Extremity/Trunk Assessment   Upper Extremity Assessment Upper Extremity Assessment: Generalized weakness    Lower Extremity Assessment Lower Extremity Assessment: Generalized weakness;LLE deficits/detail LLE Deficits / Details:  Reports L ankle pain     Cervical / Trunk Assessment Cervical / Trunk Assessment: Kyphotic  Communication   Communication: No difficulties  Cognition Arousal/Alertness: Awake/alert Behavior During Therapy: Anxious Overall Cognitive Status: No family/caregiver present to determine baseline cognitive functioning                                  General Comments: Pt anxious and fearful of falling. Pt reporting she lives with her husband, however, RN reports husband has recently passed away. Unsure of baseline.       General Comments General comments (skin integrity, edema, etc.): No family present     Exercises     Assessment/Plan    PT Assessment Patient needs continued PT services  PT Problem List Decreased strength;Decreased balance;Decreased mobility;Decreased cognition;Decreased knowledge of use of DME;Decreased knowledge of precautions;Decreased safety awareness;Pain       PT Treatment Interventions Stair training;Gait training;DME instruction;Functional mobility training;Therapeutic activities;Therapeutic exercise;Balance training;Patient/family education    PT Goals (Current goals can be found in the Care Plan section)  Acute Rehab PT Goals Patient Stated Goal: to go home and lay down PT Goal Formulation: With patient Time For Goal Achievement: 10/13/19 Potential to Achieve Goals: Fair    Frequency Min 2X/week   Barriers to discharge        Co-evaluation               AM-PAC PT "6 Clicks" Mobility  Outcome Measure Help needed turning from your back to your side while in a flat bed without using bedrails?: A Little Help needed moving from lying on your back to sitting on the side of a flat bed without using bedrails?: A Lot Help needed moving to and from a bed to a chair (including a wheelchair)?: A Lot Help needed standing up from a chair using your arms (e.g., wheelchair or bedside chair)?: A Lot Help needed to walk in hospital room?: Total Help needed climbing 3-5 steps with a railing? : Total 6 Click Score: 11    End of Session Equipment Utilized During Treatment: Gait belt Activity Tolerance: Patient tolerated treatment well Patient left: in bed;with call bell/phone within reach Nurse Communication: Mobility status PT Visit Diagnosis: Unsteadiness on feet (R26.81);Muscle  weakness (generalized) (M62.81)    Time: [id number] PT Time Calculation (min) (ACUTE ONLY): 19 min   Charges:   PT Evaluation $PT Eval Moderate Complexity: 1 Mod          Reuel Derby, PT, DPT  Acute Rehabilitation Services  Pager: [phone] Office: [phone]   Rudean Hitt 09/29/2019, 12:50 PM

## 2019-09-29 NOTE — Progress Notes (Addendum)
3pm: CSW received return call from patient's son [name] who requested patient return home with home health services.   CSW confirmed patient's home address of [address] [place].  1:30pm: CSW completed FL2 and faxed out patient's clinical information to local facilities for review.  CSW attempted to reach patient's son [name] at [phone], a voicemail was left requesting a return.  Madilyn Fireman, MSW, LCSW-A Transitions of Care  Clinical Social Worker  [facility] Emergency Departments  Medical ICU [phone]

## 2019-09-29 NOTE — ED Notes (Signed)
Offered patient lunch, pt declined, stating she is not hungry.

## 2019-09-29 NOTE — Progress Notes (Signed)
[place] for warfarin Indication: atrial fibrillation, mMVR, hx DVT  No Known Allergies   Vital Signs: Temp: 97.7 F (36.5 C) (02/03 0126) Temp Source: Oral (02/03 0126) BP: 132/96 (02/03 0800) Pulse Rate: 74 (02/03 0800)  Labs: Recent Labs    09/29/19 0131 09/29/19 0402  HGB 14.9  --   HCT 48.8*  --   PLT 164  --   LABPROT 15.0  --   INR 1.2  --   CREATININE 0.96  --   TROPONINIHS 176* 163*    CrCl cannot be calculated (Unknown ideal weight.).  Assessment: 75 yof presented to the ED with CP. Initially planned to start IV heparin since pts INR is subtherapeutic at 1.2 but this was not started due to possible discharge. MD loaded with warfarin 10mg  earlier this morning. Pharmacy to take over warfarin dosing and patient will also be started on therapeutic lovenox until INR is at goal.   Goal of Therapy:  INR 2.5-3.5 Monitor platelets by anticoagulation protocol: Yes   Plan:  MD dosed warfarin earlier today Daily INR Continue therapeutic lovenox until INR is at goal  Salome Arnt, PharmD, BCPS Clinical Pharmacist Please see AMION for all pharmacy numbers 09/29/2019 9:09 AM

## 2019-09-29 NOTE — ED Triage Notes (Signed)
Patient arrived with EMS from home reports central chest pain with nausea and runs of V-tach this evening , mild SOB , she received ASA 324 mg po and Zofran 4 mg IM prior to arrival . She has a Investment banker, corporate .

## 2019-10-01 ENCOUNTER — Inpatient Hospital Stay (HOSPITAL_COMMUNITY)
Admission: EM | Admit: 2019-10-01 | Discharge: 2019-10-13 | DRG: 253 | Disposition: A | Discharge status: Home Health | Payer: Medicare Other | Attending: Vascular Surgery | Admitting: Vascular Surgery

## 2019-10-01 ENCOUNTER — Emergency Department (HOSPITAL_COMMUNITY): Payer: Medicare Other | Admitting: Certified Registered Nurse Anesthetist

## 2019-10-01 ENCOUNTER — Telehealth: Payer: Self-pay | Admitting: Internal Medicine

## 2019-10-01 ENCOUNTER — Encounter (HOSPITAL_COMMUNITY)
Admission: EM | Disposition: A | Discharge status: Home Health | Payer: Self-pay | Source: Home / Self Care | Attending: Vascular Surgery

## 2019-10-01 ENCOUNTER — Encounter (HOSPITAL_COMMUNITY): Payer: Self-pay | Admitting: Emergency Medicine

## 2019-10-01 ENCOUNTER — Emergency Department (HOSPITAL_COMMUNITY): Payer: Medicare Other

## 2019-10-01 ENCOUNTER — Other Ambulatory Visit: Payer: Self-pay

## 2019-10-01 DIAGNOSIS — D62 Acute posthemorrhagic anemia: Secondary | ICD-10-CM | POA: Diagnosis not present

## 2019-10-01 DIAGNOSIS — Z8674 Personal history of sudden cardiac arrest: Secondary | ICD-10-CM | POA: Diagnosis not present

## 2019-10-01 DIAGNOSIS — Z79899 Other long term (current) drug therapy: Secondary | ICD-10-CM

## 2019-10-01 DIAGNOSIS — R42 Dizziness and giddiness: Secondary | ICD-10-CM

## 2019-10-01 DIAGNOSIS — R52 Pain, unspecified: Secondary | ICD-10-CM | POA: Diagnosis not present

## 2019-10-01 DIAGNOSIS — S8012XA Contusion of left lower leg, initial encounter: Secondary | ICD-10-CM | POA: Diagnosis not present

## 2019-10-01 DIAGNOSIS — I428 Other cardiomyopathies: Secondary | ICD-10-CM | POA: Diagnosis present

## 2019-10-01 DIAGNOSIS — Y838 Other surgical procedures as the cause of abnormal reaction of the patient, or of later complication, without mention of misadventure at the time of the procedure: Secondary | ICD-10-CM | POA: Diagnosis not present

## 2019-10-01 DIAGNOSIS — R791 Abnormal coagulation profile: Secondary | ICD-10-CM | POA: Diagnosis present

## 2019-10-01 DIAGNOSIS — Z8249 Family history of ischemic heart disease and other diseases of the circulatory system: Secondary | ICD-10-CM | POA: Diagnosis not present

## 2019-10-01 DIAGNOSIS — F0390 Unspecified dementia without behavioral disturbance: Secondary | ICD-10-CM | POA: Diagnosis present

## 2019-10-01 DIAGNOSIS — I743 Embolism and thrombosis of arteries of the lower extremities: Secondary | ICD-10-CM | POA: Diagnosis present

## 2019-10-01 DIAGNOSIS — I998 Other disorder of circulatory system: Secondary | ICD-10-CM | POA: Diagnosis present

## 2019-10-01 DIAGNOSIS — I951 Orthostatic hypotension: Secondary | ICD-10-CM | POA: Diagnosis present

## 2019-10-01 DIAGNOSIS — Z86718 Personal history of other venous thrombosis and embolism: Secondary | ICD-10-CM

## 2019-10-01 DIAGNOSIS — R209 Unspecified disturbances of skin sensation: Secondary | ICD-10-CM | POA: Diagnosis present

## 2019-10-01 DIAGNOSIS — Z419 Encounter for procedure for purposes other than remedying health state, unspecified: Secondary | ICD-10-CM

## 2019-10-01 DIAGNOSIS — R0789 Other chest pain: Secondary | ICD-10-CM | POA: Diagnosis present

## 2019-10-01 DIAGNOSIS — I472 Ventricular tachycardia, unspecified: Secondary | ICD-10-CM

## 2019-10-01 DIAGNOSIS — I5042 Chronic combined systolic (congestive) and diastolic (congestive) heart failure: Secondary | ICD-10-CM | POA: Diagnosis present

## 2019-10-01 DIAGNOSIS — Z7983 Long term (current) use of bisphosphonates: Secondary | ICD-10-CM | POA: Diagnosis not present

## 2019-10-01 DIAGNOSIS — I482 Chronic atrial fibrillation, unspecified: Secondary | ICD-10-CM | POA: Diagnosis present

## 2019-10-01 DIAGNOSIS — Z7901 Long term (current) use of anticoagulants: Secondary | ICD-10-CM | POA: Diagnosis not present

## 2019-10-01 DIAGNOSIS — Z85038 Personal history of other malignant neoplasm of large intestine: Secondary | ICD-10-CM

## 2019-10-01 DIAGNOSIS — I82432 Acute embolism and thrombosis of left popliteal vein: Secondary | ICD-10-CM | POA: Diagnosis present

## 2019-10-01 DIAGNOSIS — I7090 Unspecified atherosclerosis: Secondary | ICD-10-CM

## 2019-10-01 DIAGNOSIS — M1990 Unspecified osteoarthritis, unspecified site: Secondary | ICD-10-CM | POA: Diagnosis present

## 2019-10-01 DIAGNOSIS — Z8673 Personal history of transient ischemic attack (TIA), and cerebral infarction without residual deficits: Secondary | ICD-10-CM

## 2019-10-01 DIAGNOSIS — I97638 Postprocedural hematoma of a circulatory system organ or structure following other circulatory system procedure: Secondary | ICD-10-CM | POA: Diagnosis not present

## 2019-10-01 DIAGNOSIS — Z20822 Contact with and (suspected) exposure to covid-19: Secondary | ICD-10-CM | POA: Diagnosis present

## 2019-10-01 HISTORY — PX: EMBOLECTOMY: SHX44

## 2019-10-01 LAB — CBC WITH DIFFERENTIAL/PLATELET
Abs Immature Granulocytes: 0.01 10*3/uL (ref 0.00–0.07)
Basophils Absolute: 0 10*3/uL (ref 0.0–0.1)
Basophils Relative: 1 %
Eosinophils Absolute: 0.1 10*3/uL (ref 0.0–0.5)
Eosinophils Relative: 1 %
HCT: 45.4 % (ref 36.0–46.0)
Hemoglobin: 13.9 g/dL (ref 12.0–15.0)
Immature Granulocytes: 0 %
Lymphocytes Relative: 28 %
Lymphs Abs: 1.3 10*3/uL (ref 0.7–4.0)
MCH: 29.8 pg (ref 26.0–34.0)
MCHC: 30.6 g/dL (ref 30.0–36.0)
MCV: 97.2 fL (ref 80.0–100.0)
Monocytes Absolute: 0.5 10*3/uL (ref 0.1–1.0)
Monocytes Relative: 10 %
Neutro Abs: 2.8 10*3/uL (ref 1.7–7.7)
Neutrophils Relative %: 60 %
Platelets: 185 10*3/uL (ref 150–400)
RBC: 4.67 MIL/uL (ref 3.87–5.11)
RDW: 15.9 % — ABNORMAL HIGH (ref 11.5–15.5)
WBC: 4.7 10*3/uL (ref 4.0–10.5)
nRBC: 0 % (ref 0.0–0.2)

## 2019-10-01 LAB — BASIC METABOLIC PANEL
Anion gap: 12 (ref 5–15)
BUN: 20 mg/dL (ref 8–23)
CO2: 21 mmol/L — ABNORMAL LOW (ref 22–32)
Calcium: 9.3 mg/dL (ref 8.9–10.3)
Chloride: 104 mmol/L (ref 98–111)
Creatinine, Ser: 0.9 mg/dL (ref 0.44–1.00)
GFR calc Af Amer: >60 mL/min (ref >60)
GFR calc non Af Amer: >60 mL/min (ref >60)
Glucose, Bld: 99 mg/dL (ref 70–99)
Potassium: 4.3 mmol/L (ref 3.5–5.1)
Sodium: 137 mmol/L (ref 135–145)

## 2019-10-01 LAB — RESPIRATORY PANEL BY RT PCR (FLU A&B, COVID)
Influenza A by PCR: NEGATIVE
Influenza B by PCR: NEGATIVE
SARS Coronavirus 2 by RT PCR: NEGATIVE

## 2019-10-01 LAB — PROTIME-INR
INR: 1.8 — ABNORMAL HIGH (ref 0.8–1.2)
Prothrombin Time: 21 seconds — ABNORMAL HIGH (ref 11.4–15.2)

## 2019-10-01 SURGERY — EMBOLECTOMY
Anesthesia: General | Laterality: Left

## 2019-10-01 MED ORDER — ONDANSETRON HCL 4 MG/2ML IJ SOLN: 4.0000 mg | Freq: Four times a day (QID) | INTRAMUSCULAR | Status: DC | PRN

## 2019-10-01 MED ORDER — SUCCINYLCHOLINE CHLORIDE 200 MG/10ML IV SOSY
PREFILLED_SYRINGE | INTRAVENOUS | Status: DC | PRN
  Administered 2019-10-01: 100 mg via INTRAVENOUS

## 2019-10-01 MED ORDER — PHENOL 1.4 % MT LIQD: 1.0000 | OROMUCOSAL | Status: DC | PRN

## 2019-10-01 MED ORDER — SENNOSIDES-DOCUSATE SODIUM 8.6-50 MG PO TABS
1.0000 | ORAL_TABLET | Freq: Every evening | ORAL | Status: DC | PRN
  Administered 2019-10-06 – 2019-10-08 (×2): 1 via ORAL
  Filled 2019-10-01 (×2): qty 1

## 2019-10-01 MED ORDER — HYDROMORPHONE HCL 1 MG/ML IJ SOLN
INTRAMUSCULAR | Status: AC
  Filled 2019-10-01: qty 1

## 2019-10-01 MED ORDER — SODIUM CHLORIDE 0.9 % IV SOLN
INTRAVENOUS | Status: DC | PRN
  Administered 2019-10-01: 500 mL

## 2019-10-01 MED ORDER — DEXAMETHASONE SODIUM PHOSPHATE 10 MG/ML IJ SOLN
INTRAMUSCULAR | Status: AC
  Filled 2019-10-01: qty 2

## 2019-10-01 MED ORDER — LIDOCAINE 2% (20 MG/ML) 5 ML SYRINGE
INTRAMUSCULAR | Status: DC | PRN
  Administered 2019-10-01: 100 mg via INTRAVENOUS

## 2019-10-01 MED ORDER — FUROSEMIDE 20 MG PO TABS
20.0000 mg | ORAL_TABLET | Freq: Every day | ORAL | Status: DC
  Administered 2019-10-02 – 2019-10-13 (×12): 20 mg via ORAL
  Filled 2019-10-01 (×12): qty 1

## 2019-10-01 MED ORDER — SUCCINYLCHOLINE CHLORIDE 200 MG/10ML IV SOSY
PREFILLED_SYRINGE | INTRAVENOUS | Status: AC
  Filled 2019-10-01: qty 10

## 2019-10-01 MED ORDER — ALUM & MAG HYDROXIDE-SIMETH 200-200-20 MG/5ML PO SUSP: 15.0000 mL | ORAL | Status: DC | PRN

## 2019-10-01 MED ORDER — CARVEDILOL 3.125 MG PO TABS
3.1250 mg | ORAL_TABLET | Freq: Two times a day (BID) | ORAL | Status: DC
  Administered 2019-10-02 – 2019-10-13 (×20): 3.125 mg via ORAL
  Filled 2019-10-01 (×23): qty 1

## 2019-10-01 MED ORDER — HYDRALAZINE HCL 20 MG/ML IJ SOLN: 5.0000 mg | INTRAMUSCULAR | Status: DC | PRN

## 2019-10-01 MED ORDER — PHENYLEPHRINE HCL-NACL 10-0.9 MG/250ML-% IV SOLN
INTRAVENOUS | Status: DC | PRN
  Administered 2019-10-01: 25 ug/min via INTRAVENOUS

## 2019-10-01 MED ORDER — FENTANYL CITRATE (PF) 250 MCG/5ML IJ SOLN
INTRAMUSCULAR | Status: DC | PRN
  Administered 2019-10-01 (×4): 50 ug via INTRAVENOUS

## 2019-10-01 MED ORDER — METOCLOPRAMIDE HCL 5 MG/ML IJ SOLN
5.0000 mg | Freq: Once | INTRAMUSCULAR | Status: AC
  Administered 2019-10-01: 5 mg via INTRAVENOUS

## 2019-10-01 MED ORDER — FENTANYL CITRATE (PF) 250 MCG/5ML IJ SOLN
INTRAMUSCULAR | Status: AC
  Filled 2019-10-01: qty 5

## 2019-10-01 MED ORDER — PROPOFOL 10 MG/ML IV BOLUS
INTRAVENOUS | Status: AC
  Filled 2019-10-01: qty 20

## 2019-10-01 MED ORDER — PHENYLEPHRINE 40 MCG/ML (10ML) SYRINGE FOR IV PUSH (FOR BLOOD PRESSURE SUPPORT)
PREFILLED_SYRINGE | INTRAVENOUS | Status: AC
  Filled 2019-10-01: qty 10

## 2019-10-01 MED ORDER — ROCURONIUM BROMIDE 50 MG/5ML IV SOSY
PREFILLED_SYRINGE | INTRAVENOUS | Status: DC | PRN
  Administered 2019-10-01: 40 mg via INTRAVENOUS

## 2019-10-01 MED ORDER — METOPROLOL TARTRATE 5 MG/5ML IV SOLN: 2.0000 mg | INTRAVENOUS | Status: DC | PRN

## 2019-10-01 MED ORDER — TIZANIDINE HCL 4 MG PO TABS
4.0000 mg | ORAL_TABLET | Freq: Every day | ORAL | Status: DC | PRN
  Administered 2019-10-06: 4 mg via ORAL
  Filled 2019-10-01: qty 1

## 2019-10-01 MED ORDER — LIDOCAINE 2% (20 MG/ML) 5 ML SYRINGE
INTRAMUSCULAR | Status: AC
  Filled 2019-10-01: qty 5

## 2019-10-01 MED ORDER — DOCUSATE SODIUM 100 MG PO CAPS
100.0000 mg | ORAL_CAPSULE | Freq: Every day | ORAL | Status: DC
  Administered 2019-10-02 – 2019-10-13 (×12): 100 mg via ORAL
  Filled 2019-10-01 (×12): qty 1

## 2019-10-01 MED ORDER — CEFAZOLIN SODIUM-DEXTROSE 2-4 GM/100ML-% IV SOLN
2.0000 g | Freq: Three times a day (TID) | INTRAVENOUS | Status: AC
  Administered 2019-10-02 (×2): 2 g via INTRAVENOUS
  Filled 2019-10-01 (×3): qty 100

## 2019-10-01 MED ORDER — ROCURONIUM BROMIDE 10 MG/ML (PF) SYRINGE
PREFILLED_SYRINGE | INTRAVENOUS | Status: AC
  Filled 2019-10-01: qty 10

## 2019-10-01 MED ORDER — SODIUM CHLORIDE 0.9 % IV SOLN: INTRAVENOUS | Status: DC

## 2019-10-01 MED ORDER — LABETALOL HCL 5 MG/ML IV SOLN: 10.0000 mg | INTRAVENOUS | Status: DC | PRN

## 2019-10-01 MED ORDER — POTASSIUM CHLORIDE ER 10 MEQ PO TBCR: 10.0000 meq | EXTENDED_RELEASE_TABLET | Freq: Every day | ORAL | Status: DC

## 2019-10-01 MED ORDER — AMIODARONE HCL 200 MG PO TABS: 200.0000 mg | ORAL_TABLET | Freq: Every day | ORAL | Status: DC

## 2019-10-01 MED ORDER — DIGOXIN 125 MCG PO TABS
125.0000 ug | ORAL_TABLET | Freq: Every day | ORAL | Status: DC
  Administered 2019-10-02 – 2019-10-13 (×12): 125 ug via ORAL
  Filled 2019-10-01 (×12): qty 1

## 2019-10-01 MED ORDER — DEXAMETHASONE SODIUM PHOSPHATE 10 MG/ML IJ SOLN
INTRAMUSCULAR | Status: DC | PRN
  Administered 2019-10-01: 5 mg via INTRAVENOUS

## 2019-10-01 MED ORDER — GUAIFENESIN-DM 100-10 MG/5ML PO SYRP: 15.0000 mL | ORAL_SOLUTION | ORAL | Status: DC | PRN

## 2019-10-01 MED ORDER — OXYCODONE HCL 5 MG PO TABS: 5.0000 mg | ORAL_TABLET | ORAL | Status: DC | PRN

## 2019-10-01 MED ORDER — HEPARIN (PORCINE) 25000 UT/250ML-% IV SOLN
950.0000 [IU]/h | INTRAVENOUS | Status: DC
  Administered 2019-10-01: 950 [IU]/h via INTRAVENOUS
  Filled 2019-10-01: qty 250

## 2019-10-01 MED ORDER — MAGNESIUM SULFATE 2 GM/50ML IV SOLN: 2.0000 g | Freq: Every day | INTRAVENOUS | Status: DC | PRN

## 2019-10-01 MED ORDER — ALENDRONATE SODIUM 70 MG PO TABS: 70.0000 mg | ORAL_TABLET | ORAL | Status: DC

## 2019-10-01 MED ORDER — ONDANSETRON HCL 4 MG/2ML IJ SOLN
INTRAMUSCULAR | Status: DC | PRN
  Administered 2019-10-01: 4 mg via INTRAVENOUS

## 2019-10-01 MED ORDER — METOCLOPRAMIDE HCL 5 MG/ML IJ SOLN
INTRAMUSCULAR | Status: AC
  Filled 2019-10-01: qty 2

## 2019-10-01 MED ORDER — PHENYLEPHRINE 40 MCG/ML (10ML) SYRINGE FOR IV PUSH (FOR BLOOD PRESSURE SUPPORT)
PREFILLED_SYRINGE | INTRAVENOUS | Status: DC | PRN
  Administered 2019-10-01: 200 ug via INTRAVENOUS

## 2019-10-01 MED ORDER — HYDROMORPHONE HCL 1 MG/ML IJ SOLN
0.5000 mg | INTRAMUSCULAR | Status: DC | PRN
  Administered 2019-10-03 – 2019-10-06 (×2): 0.5 mg via INTRAVENOUS
  Administered 2019-10-07: 1 mg via INTRAVENOUS
  Filled 2019-10-01 (×3): qty 1

## 2019-10-01 MED ORDER — MIDODRINE HCL 5 MG PO TABS
10.0000 mg | ORAL_TABLET | Freq: Two times a day (BID) | ORAL | Status: DC
  Administered 2019-10-02 – 2019-10-11 (×19): 10 mg via ORAL
  Filled 2019-10-01 (×20): qty 2

## 2019-10-01 MED ORDER — SODIUM CHLORIDE 0.9 % IV SOLN
INTRAVENOUS | Status: AC
  Filled 2019-10-01: qty 1.2

## 2019-10-01 MED ORDER — ONDANSETRON HCL 4 MG/2ML IJ SOLN
INTRAMUSCULAR | Status: AC
  Filled 2019-10-01: qty 2

## 2019-10-01 MED ORDER — POTASSIUM CHLORIDE CRYS ER 20 MEQ PO TBCR
20.0000 meq | EXTENDED_RELEASE_TABLET | Freq: Every day | ORAL | Status: AC | PRN
  Administered 2019-10-10: 40 meq via ORAL
  Filled 2019-10-01: qty 2

## 2019-10-01 MED ORDER — ETOMIDATE 2 MG/ML IV SOLN
INTRAVENOUS | Status: DC | PRN
  Administered 2019-10-01: 10 mg via INTRAVENOUS

## 2019-10-01 MED ORDER — 0.9 % SODIUM CHLORIDE (POUR BTL) OPTIME
TOPICAL | Status: DC | PRN
  Administered 2019-10-01: 2000 mL

## 2019-10-01 MED ORDER — ACETAMINOPHEN 325 MG RE SUPP: 325.0000 mg | RECTAL | Status: DC | PRN

## 2019-10-01 MED ORDER — PANTOPRAZOLE SODIUM 40 MG PO TBEC
40.0000 mg | DELAYED_RELEASE_TABLET | Freq: Every day | ORAL | Status: DC
  Administered 2019-10-02 – 2019-10-13 (×12): 40 mg via ORAL
  Filled 2019-10-01 (×12): qty 1

## 2019-10-01 MED ORDER — SUGAMMADEX SODIUM 200 MG/2ML IV SOLN
INTRAVENOUS | Status: DC | PRN
  Administered 2019-10-01: 150 mg via INTRAVENOUS

## 2019-10-01 MED ORDER — HEPARIN BOLUS VIA INFUSION
3500.0000 [IU] | Freq: Once | INTRAVENOUS | Status: AC
  Administered 2019-10-01: 3500 [IU] via INTRAVENOUS
  Filled 2019-10-01: qty 3500

## 2019-10-01 MED ORDER — LACTATED RINGERS IV SOLN: INTRAVENOUS | Status: DC

## 2019-10-01 MED ORDER — HYDROMORPHONE HCL 1 MG/ML IJ SOLN
0.2500 mg | INTRAMUSCULAR | Status: DC | PRN
  Administered 2019-10-01 (×3): 0.5 mg via INTRAVENOUS

## 2019-10-01 MED ORDER — CEFAZOLIN SODIUM-DEXTROSE 2-3 GM-%(50ML) IV SOLR
INTRAVENOUS | Status: DC | PRN
  Administered 2019-10-01: 2 g via INTRAVENOUS

## 2019-10-01 MED ORDER — EPHEDRINE 5 MG/ML INJ
INTRAVENOUS | Status: AC
  Filled 2019-10-01: qty 10

## 2019-10-01 MED ORDER — SODIUM CHLORIDE 0.9 % IV SOLN: 500.0000 mL | Freq: Once | INTRAVENOUS | Status: DC | PRN

## 2019-10-01 MED ORDER — ACETAMINOPHEN 325 MG PO TABS
325.0000 mg | ORAL_TABLET | ORAL | Status: DC | PRN
  Administered 2019-10-06 – 2019-10-12 (×4): 650 mg via ORAL
  Filled 2019-10-01 (×4): qty 2

## 2019-10-01 MED ORDER — BISACODYL 5 MG PO TBEC
5.0000 mg | DELAYED_RELEASE_TABLET | Freq: Every day | ORAL | Status: DC | PRN
  Administered 2019-10-06 – 2019-10-10 (×3): 5 mg via ORAL
  Filled 2019-10-01 (×3): qty 1

## 2019-10-01 SURGICAL SUPPLY — 59 items
BANDAGE ESMARK 6X9 LF (GAUZE/BANDAGES/DRESSINGS) IMPLANT
BNDG ESMARK 6X9 LF (GAUZE/BANDAGES/DRESSINGS)
CANISTER SUCT 3000ML PPV (MISCELLANEOUS) ×2 IMPLANT
CATH EMB 3FR 80CM (CATHETERS) ×2 IMPLANT
CATH EMB 4FR 80CM (CATHETERS) ×2 IMPLANT
CATH EMB 5FR 80CM (CATHETERS) IMPLANT
CLIP VESOCCLUDE MED 24/CT (CLIP) ×2 IMPLANT
CLIP VESOCCLUDE SM WIDE 24/CT (CLIP) ×2 IMPLANT
COVER WAND RF STERILE (DRAPES) IMPLANT
CUFF TOURN SGL QUICK 24 (TOURNIQUET CUFF)
CUFF TOURN SGL QUICK 34 (TOURNIQUET CUFF)
CUFF TOURN SGL QUICK 42 (TOURNIQUET CUFF) IMPLANT
CUFF TRNQT CYL 24X4X16.5-23 (TOURNIQUET CUFF) IMPLANT
CUFF TRNQT CYL 34X4.125X (TOURNIQUET CUFF) IMPLANT
DECANTER SPIKE VIAL GLASS SM (MISCELLANEOUS) IMPLANT
DERMABOND ADVANCED (GAUZE/BANDAGES/DRESSINGS) ×1
DERMABOND ADVANCED .7 DNX12 (GAUZE/BANDAGES/DRESSINGS) ×1 IMPLANT
DRAIN CHANNEL 10M FLAT 3/4 FLT (DRAIN) ×2 IMPLANT
DRAIN SNY 10X20 3/4 PERF (WOUND CARE) IMPLANT
DRAPE X-RAY CASS 24X20 (DRAPES) IMPLANT
ELECT REM PT RETURN 9FT ADLT (ELECTROSURGICAL) ×2
ELECTRODE REM PT RTRN 9FT ADLT (ELECTROSURGICAL) ×1 IMPLANT
EVACUATOR SILICONE 100CC (DRAIN) IMPLANT
GAUZE SPONGE 4X4 12PLY STRL LF (GAUZE/BANDAGES/DRESSINGS) ×2 IMPLANT
GLOVE BIO SURGEON STRL SZ7.5 (GLOVE) ×2 IMPLANT
GLOVE BIOGEL PI IND STRL 6.5 (GLOVE) ×1 IMPLANT
GLOVE BIOGEL PI IND STRL 7.5 (GLOVE) ×2 IMPLANT
GLOVE BIOGEL PI INDICATOR 6.5 (GLOVE) ×1
GLOVE BIOGEL PI INDICATOR 7.5 (GLOVE) ×2
GLOVE ECLIPSE 7.0 STRL STRAW (GLOVE) ×2 IMPLANT
GLOVE INDICATOR 7.0 STRL GRN (GLOVE) ×2 IMPLANT
GLOVE SURG SS PI 7.0 STRL IVOR (GLOVE) ×2 IMPLANT
GLOVE SURG SS PI 7.5 STRL IVOR (GLOVE) ×2 IMPLANT
GOWN STRL REUS W/ TWL LRG LVL3 (GOWN DISPOSABLE) ×5 IMPLANT
GOWN STRL REUS W/TWL LRG LVL3 (GOWN DISPOSABLE) ×5
KIT BASIN OR (CUSTOM PROCEDURE TRAY) ×2 IMPLANT
KIT TURNOVER KIT B (KITS) ×2 IMPLANT
NS IRRIG 1000ML POUR BTL (IV SOLUTION) ×4 IMPLANT
PACK PERIPHERAL VASCULAR (CUSTOM PROCEDURE TRAY) ×2 IMPLANT
PAD ARMBOARD 7.5X6 YLW CONV (MISCELLANEOUS) ×4 IMPLANT
SET COLLECT BLD 21X3/4 12 (NEEDLE) IMPLANT
SPONGE SURGIFOAM ABS GEL 100 (HEMOSTASIS) IMPLANT
STAPLER VISISTAT 35W (STAPLE) IMPLANT
STOPCOCK 4 WAY LG BORE MALE ST (IV SETS) IMPLANT
SUT ETHILON 3 0 PS 1 (SUTURE) ×2 IMPLANT
SUT PROLENE 5 0 C 1 24 (SUTURE) ×2 IMPLANT
SUT PROLENE 6 0 CC (SUTURE) ×4 IMPLANT
SUT VIC AB 2-0 CTX 36 (SUTURE) ×2 IMPLANT
SUT VIC AB 3-0 SH 27 (SUTURE) ×1
SUT VIC AB 3-0 SH 27X BRD (SUTURE) ×1 IMPLANT
SUT VIC AB 4-0 PS2 18 (SUTURE) ×2 IMPLANT
SYR 3ML LL SCALE MARK (SYRINGE) ×4 IMPLANT
SYR BULB IRRIGATION 50ML (SYRINGE) ×2 IMPLANT
TAPE CLOTH 4X10 WHT NS (GAUZE/BANDAGES/DRESSINGS) ×2 IMPLANT
TOWEL GREEN STERILE (TOWEL DISPOSABLE) ×4 IMPLANT
TRAY FOLEY MTR SLVR 16FR STAT (SET/KITS/TRAYS/PACK) ×2 IMPLANT
TUBING EXTENTION W/L.L. (IV SETS) IMPLANT
UNDERPAD 30X30 (UNDERPADS AND DIAPERS) ×2 IMPLANT
WATER STERILE IRR 1000ML POUR (IV SOLUTION) ×2 IMPLANT

## 2019-10-01 NOTE — Progress Notes (Signed)
Left lower extremity venous duplex has been completed. Preliminary results can be found in CV Proc through chart review.  Results were given to Dr. Maryan Rued.  10/01/19 2:55 PM Angela [name] RVT

## 2019-10-01 NOTE — Op Note (Signed)
Procedure: Left popliteal and tibial embolectomy  Preoperative diagnosis: Acute ischemia left leg  Postoperative diagnosis: Same  Anesthesia: General  Assistant: Gerri Lins, PA-C  Operative findings: Thrombus left popliteal artery  Specimens: Embolic material left popliteal artery  Operative details: After team informed consent, patient taken the operating.  The patient placed supine position operating table.  After induction of general anesthesia and endotracheal intubation patient's left lower extremity was prepped and draped in usual sterile fashion.  Longitudinal incision was made on medial aspect of the left leg carried out through subcutaneous tissues to the level greater saphenous vein.  This was reflected superiorly.  Incision was deepened and the popliteal space was entered through the fascia.  Below-knee popliteal artery was dissected free circumferentially.  It was fairly soft on palpation.  There was no pulse within it.  It was fairly small.  About 3 to 4 mm in diameter.  Dissection was carried out the level of the takeoff of what appeared to be the tibioperoneal trunk and the peroneal arteries.  A clear-cut anterior tibial artery was not identified.  There was a very small branch coming off where the other tibials came off but this did not appear to be large enough to be the anterior tibial artery.  Patient had arrived on a heparin drip.  This was continued.  The artery was controlled proximally distally.  Longitudinal opening was made in the popliteal artery just above the tibial takeoff.  #4 Fogarty catheter was used to thrombectomized the artery proximally.  Numerous passes were made with return of a large amount of embolic material.  This was sent to pathology as specimen.  There was brisk arterial inflow at this point.  2 clean passes were obtained.  I then proceeded to pass a #3 Fogarty catheter down the more lateral artery.  The catheter was passed to about the 30 mm length  and then would not go further.  There was some backbleeding but no significant embolic material was retrieved.  This was thoroughly flushed with heparinized saline.  2 clean passes were obtained.  I then was able to pass the 3 30 catheter down what appeared to be the posterior tibial artery per 50 cm length all the way to the foot.  Multiple passes were made with no return of embolic material.  There was fairly good bright red backbleeding from this artery at this point.  These were all again controlled.  A piece of saphenous vein that had been dissected free during the initial dissection was ligated proximally distally opened longitudinally reversed and then sent off as a patch angioplasty using a running 6-0 Prolene suture.  At completion of the anastomosis it was for blood backbled thoroughly flushed.  Anastomosis was secured Vesseloops were released and there was pulsatile flow of popliteal artery and good Doppler flow in the origins of the tibial arteries immediately.  Patient had a posterior tibial Doppler signal at this point at the foot.  The anterior and lateral compartments felt soft and I did not feel that this needed a fasciotomy at this point.  A 10 flat Jackson-Pratt drain was placed through a separate stab incision just low the incision.  The initial stab incision had profuse bleeding point of appear to be a varicose vein but this was controlled with a 3-0 nylon stitch.  The additional drain opening did not have any significant bleeding.  The JP drain was therapeutic and sutured to the skin.  The below-knee incision was then closed with  a running 3-0 Vicryl suture in the subcutaneous layer and 4-0 Vicryl subcuticular stitch in the skin.  The patient tired procedure well and there were no complications.  The instrument sponge needle counts correct in the case.  The patient was taken recovery room in stable condition.  Ruta Hinds, MD [facility] [place] Office:  [phone]

## 2019-10-01 NOTE — Telephone Encounter (Signed)
Patient's son is calling stating the patient has been experiencing pain in her left leg since 09/29/19. He states the pain was so severe it caused her defibrillator to go off. The patient was seen in the ED on the 3rd in regards to this, but he states they focused on her CP she was experiencing over her leg. [name] states the patient has not had CP since the hospital visit, but is still having excessive pain in her leg and it is cold to the touch. An appointment has been schedule for 10/06/19 at 1:45 AM with Tommye Standard in regards to it but they would like to have her seen today if possible due to being concerned with blood clots. Please advise

## 2019-10-01 NOTE — Anesthesia Preprocedure Evaluation (Signed)
Anesthesia Evaluation  Patient identified by MRN, date of birth, ID band Patient awake    Reviewed: Allergy & Precautions, NPO status , Patient's Chart, lab work & pertinent test results  Airway Mallampati: II       Dental   Pulmonary    breath sounds clear to auscultation       Cardiovascular + Peripheral Vascular Disease and +CHF   Rhythm:Regular Rate:Normal     Neuro/Psych    GI/Hepatic negative GI ROS, Neg liver ROS,   Endo/Other  negative endocrine ROS  Renal/GU negative Renal ROS     Musculoskeletal  (+) Arthritis ,   Abdominal   Peds  Hematology  (+) anemia ,   Anesthesia Other Findings   Reproductive/Obstetrics                             Anesthesia Physical Anesthesia Plan  ASA: III  Anesthesia Plan: General   Post-op Pain Management:    Induction: Intravenous  PONV Risk Score and Plan: 3 and Ondansetron and Midazolam  Airway Management Planned: Oral ETT  Additional Equipment:   Intra-op Plan:   Post-operative Plan:   Informed Consent: I have reviewed the patients History and Physical, chart, labs and discussed the procedure including the risks, benefits and alternatives for the proposed anesthesia with the patient or authorized representative who has indicated his/her understanding and acceptance.     Dental advisory given  Plan Discussed with: CRNA and Anesthesiologist  Anesthesia Plan Comments:         Anesthesia Quick Evaluation

## 2019-10-01 NOTE — H&P (Signed)
Referring Physician: [place]  Patient name: Angela [name] MRN: [id number] DOB: [birth year] Sex: female  REASON FOR CONSULT: left leg ischemia  HPI: Angela [name] is a 74 y.o. female, with 2-3 day history of acute onset pain left leg and foot.  Pt was brought to [facility] ER 2 days ago with complaint of left leg pain.  However, she was noted to also have some chest pain and v tach so instead underwent cardiac work up and was sent home.  She developed worsening pain and returned to ER today.  Pt has a history of afib and was subtherapeutic on her warfarin 2 days ago.  She was given a loading dose of 10 mg warfarin but today her INR is only 1.8.  She has a defibrillator which apparently did fire 2 days ago.  It has not fired since.  She has no shortness of breath or chest pain.  Other extremities feel ok. Other medical problems include prior stroke and DVT, cardiomyopathy last EF 20%, She has never smoked and has no history of diabetes.  She has no symptoms in her upper extremities or right foot.  Past Medical History:  Diagnosis Date  . Abnormality of gait 08/23/2014  . Arthritis   . Atrial fibrillation ([place])    A.  Chronic Coumadin  . Cardiac arrest - ventricular fibrillation    A.  12/2007;  B. 01/2008 St. Jude Promote Bi-V ICD placed  . CVA (cerebral vascular accident) ([place])   . Diverticular disease   . DVT (deep venous thrombosis) ([place])   . 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 History of colon cancer    A.  1999 - T3, N1  chemotherapy  . Internal hemorrhoids   . Memory deficit 08/23/2014  . Nonischemic cardiomyopathy ([place])    a.  11/2001 - Cath - NL Cors; b. ECHO 09/22/11: EF 20%, MVR normal, moderate LAE, mild RAE c. ECHO (08/2012): ED 20%, diff HK, LA mod dilated, mild/mod TR, RV mild/mod decreased sys fx  . Rectal bleeding 04/28/2017  . Rheumatic heart disease    A. 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 Sick sinus syndrome ([place])     A.  s/p pacer in 1995.  B.    . Systolic CHF, chronic ([place])    A.  01/2008 Echo - EF 10-20%  . UTI (urinary tract infection) 01/19/2019   Past Surgical History:  Procedure Laterality Date  . ABDOMINAL HYSTERECTOMY    . AV NODE ABLATION N/A 09/23/2011   Procedure: AV NODE ABLATION;  Surgeon: Evans Lance, MD;  Location: [facility] CATH LAB;  Service: Cardiovascular;  Laterality: N/A;  . BIV ICD GENERTAOR CHANGE OUT N/A 09/05/2014   Procedure: BIV ICD GENERTAOR CHANGE OUT;  Surgeon: Evans Lance, MD;  Location: [facility] CATH LAB;  Service: Cardiovascular;  Laterality: N/A;  . Biventricular AICD    . COLON SURGERY  1999  . History of echocardiogram  2003, 2007, 2009  . INCISION AND DRAINAGE PERIRECTAL ABSCESS Right 11/28/2018   Procedure: IRRIGATION AND DEBRIDEMENT PERIRECTAL ABSCESS;  Surgeon: Greer Pickerel, MD;  Location: [place];  Service: General;  Laterality: Right;  . Mitral Valve Replacement, Bjork-Shiley valve  1983    Family History  Problem Relation Age of Onset  . Diabetes Mother 61  . Coronary artery disease Mother        s/p cabg  . Lung cancer Father 68  smoker  . Coronary artery disease Sister        s/p cabg    SOCIAL HISTORY: Social History   Socioeconomic History  . Marital status: Married    Spouse name: Not on file  . Number of children: 1  . Years of education: Not on file  . Highest education level: Not on file  Occupational History  . Occupation: Retired    Fish farm manager: RETIRED  Tobacco Use  . Smoking status: Never Smoker  . Smokeless tobacco: Never Used  Substance and Sexual Activity  . Alcohol use: No  . Drug use: No  . Sexual activity: Not Currently  Other Topics Concern  . Not on file  Social History Narrative   The patient is married and has 1 child.  Lives in [place] with husband.  She is retired from working at [employer].  She does not smoke or drink   Social Determinants of Health   Financial Resource Strain:   . Difficulty of Paying  Living Expenses: Not on file  Food Insecurity:   . Worried About Charity fundraiser in the Last Year: Not on file  . Ran Out of Food in the Last Year: Not on file  Transportation Needs:   . Lack of Transportation (Medical): Not on file  . Lack of Transportation (Non-Medical): Not on file  Physical Activity:   . Days of Exercise per Week: Not on file  . Minutes of Exercise per Session: Not on file  Stress:   . Feeling of Stress : Not on file  Social Connections:   . Frequency of Communication with Friends and Family: Not on file  . Frequency of Social Gatherings with Friends and Family: Not on file  . Attends Religious Services: Not on file  . Active Member of Clubs or Organizations: Not on file  . Attends Archivist Meetings: Not on file  . Marital Status: Not on file  Intimate Partner Violence:   . Fear of Current or Ex-Partner: Not on file  . Emotionally Abused: Not on file  . Physically Abused: Not on file  . Sexually Abused: Not on file    No Known Allergies  No current facility-administered medications for this encounter.   Current Outpatient Medications  Medication Sig Dispense Refill  . alendronate (FOSAMAX) 70 MG tablet Take 70 mg by mouth once a week.    [name] amiodarone (PACERONE) 200 MG tablet Take 1 tablet (200 mg total) by mouth daily. (Patient not taking: Reported on 09/29/2019) 90 tablet 3  . carvedilol (COREG) 3.125 MG tablet Take 1 tablet (3.125 mg total) by mouth 2 (two) times daily with a meal. 180 tablet 3  . digoxin (LANOXIN) 0.125 MG tablet Take 125 mcg by mouth daily.    . furosemide (LASIX) 20 MG tablet Take 20 mg by mouth daily.    . midodrine (PROAMATINE) 10 MG tablet Take 10 mg by mouth 2 (two) times daily.     . Multiple Vitamins-Minerals (AIRBORNE GUMMIES PO) Take 1 tablet by mouth daily.    . potassium chloride (K-DUR) 10 MEQ tablet Take 1 tablet (10 mEq total) by mouth daily for 30 days. (Patient not taking: Reported on 09/29/2019) 30 tablet 0   . tiZANidine (ZANAFLEX) 4 MG tablet Take 4 mg by mouth daily as needed for muscle spasms.     [name] warfarin (COUMADIN) 1 MG tablet Take 4 mg by mouth See admin instructions. Take 4 tablets (4mg ) by mouth all days except  on Tues and Thurs    . warfarin (COUMADIN) 5 MG tablet Take 5 mg by mouth See admin instructions. Take 1 tablet (5mg ) by mouth on Tues and Thurs      ROS:   General:  No weight loss, Fever, chills  HEENT: No recent headaches, no nasal bleeding, no visual changes, no sore throat  Neurologic: No dizziness, blackouts, seizures. No recent symptoms of stroke or mini- stroke. No recent episodes of slurred speech, or temporary blindness.  Cardiac: No recent episodes of chest pain/pressure, no shortness of breath at rest.  + shortness of breath with exertion.  + history of atrial fibrillation or irregular heartbeat  Vascular: No history of rest pain in feet.  No history of claudication.  No history of non-healing ulcer, No history of DVT   Pulmonary: No home oxygen, no productive cough, no hemoptysis,  No asthma or wheezing  Musculoskeletal:  [ ]  Arthritis, [ ]  Low back pain,  [ ]  Joint pain  Hematologic:No history of hypercoagulable state.  No history of easy bleeding.  No history of anemia  Gastrointestinal: No hematochezia or melena,  No gastroesophageal reflux, no trouble swallowing  Urinary: [ ]  chronic Kidney disease, [ ]  on HD - [ ]  MWF or [ ]  TTHS, [ ]  Burning with urination, [ ]  Frequent urination, [ ]  Difficulty urinating;   Skin: No rashes  Psychological: No history of anxiety,  No history of depression   Physical Examination  Vitals:   10/01/19 1136 10/01/19 1403  BP: 110/80 108/80  Pulse: 74 72  Resp: 18 16  Temp: 98.2 F (36.8 C)   TempSrc: Oral   SpO2: 95% 100%    There is no height or weight on file to calculate BMI.  General:  Alert and oriented, no acute distress HEENT: Normal Neck: No JVD Pulmonary: Clear to auscultation  bilaterally Cardiac: Regular Rate and Rhythm  Abdomen: Soft, non-tender, non-distended, no mass, no scars Skin: No rash Extremity Pulses:  2+ left radial absent right radial, 2+ right ulnar 2+ bilateral brachial, femoral, 2+ right popliteal, absent left popliteal pulse absent dorsalis pedis, posterior tibial pulses bilaterally Musculoskeletal: No deformity or edema  Neurologic: Upper and lower extremity motor 5/5 and symmetric, some difficulty moving toes/ankle left foot and slight decreased sensation compared to right  DATA:  CBC    Component Value Date/Time   WBC 4.7 10/01/2019 1533   RBC 4.67 10/01/2019 1533   HGB 13.9 10/01/2019 1533   HGB 14.1 09/29/2008 1038   HCT 45.4 10/01/2019 1533   HCT 41.7 09/29/2008 1038   PLT 185 10/01/2019 1533   PLT 134 (L) 09/29/2008 1038   MCV 97.2 10/01/2019 1533   MCV 89.9 09/29/2008 1038   MCH 29.8 10/01/2019 1533   MCHC 30.6 10/01/2019 1533   RDW 15.9 (H) 10/01/2019 1533   RDW 16.2 (H) 09/29/2008 1038   LYMPHSABS 1.3 10/01/2019 1533   LYMPHSABS 0.8 (L) 09/29/2008 1038   MONOABS 0.5 10/01/2019 1533   MONOABS 0.3 09/29/2008 1038   EOSABS 0.1 10/01/2019 1533   EOSABS 0.0 09/29/2008 1038   BASOSABS 0.0 10/01/2019 1533   BASOSABS 0.0 09/29/2008 1038    BMET    Component Value Date/Time   NA 137 10/01/2019 1533   K 4.3 10/01/2019 1533   CL 104 10/01/2019 1533   CO2 21 (L) 10/01/2019 1533   GLUCOSE 99 10/01/2019 1533   BUN 20 10/01/2019 1533   CREATININE 0.90 10/01/2019 1533   CALCIUM 9.3 10/01/2019 1533  GFRNONAA >60 10/01/2019 1533   GFRAA >60 10/01/2019 1533    INR 1.8  ASSESSMENT:  Acute ischemia left leg now about 48-72 hours   PLAN:  1. Rapid COVID test preop 2. Stat CTA with runoff to define anatomy since abnormal pulse exam both legs 3. Heparin drip 4. NPO 5. To OR for left leg embolectomy possible fasciotomy  Discussed procedure risk benefits with pt and son including but not limited to bleeding infection limb  loss cardiac events   Ruta Hinds, MD [facility] [place] Office: [phone] Pager: [phone]

## 2019-10-01 NOTE — ED Provider Notes (Addendum)
[facility] EMERGENCY DEPARTMENT Provider Note   CSN: [id number] Arrival date & time: 10/01/19  1104     History No chief complaint on file.   Angela [name] is a 73 y.o. female.  HPI   74 year old female with a history of A. fib (on Coumadin), DVT, nonischemic cardiomyopathy, rheumatic heart disease, sinus sick syndrome, CHF, who presents the emergency department today complaining of left lower extremity pain. This is been present for the last several days. She was seen in the ED a few days ago for chest pain. He also notes that she was complaining that her left leg was cold.  She denies any sensory changes.  She denies any chest pain, shortness of breath or other symptoms.   Past Medical History:  Diagnosis Date  . Abnormality of gait 08/23/2014  . Arthritis   . Atrial fibrillation ([place])    A.  Chronic Coumadin  . Cardiac arrest - ventricular fibrillation    A.  12/2007;  B. 01/2008 St. Jude Promote Bi-V ICD placed  . CVA (cerebral vascular accident) ([place])   . Diverticular disease   . DVT (deep venous thrombosis) ([place])   . 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 History of colon cancer    A.  1999 - T3, N1  chemotherapy  . Internal hemorrhoids   . Memory deficit 08/23/2014  . Nonischemic cardiomyopathy ([place])    a.  11/2001 - Cath - NL Cors; b. ECHO 09/22/11: EF 20%, MVR normal, moderate LAE, mild RAE c. ECHO (08/2012): ED 20%, diff HK, LA mod dilated, mild/mod TR, RV mild/mod decreased sys fx  . Rectal bleeding 04/28/2017  . Rheumatic heart disease    A. 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 Sick sinus syndrome ([place])    A.  s/p pacer in 1995.  B.    . Systolic CHF, chronic ([place])    A.  01/2008 Echo - EF 10-20%  . UTI (urinary tract infection) 01/19/2019    Patient Active Problem List   Diagnosis Date Noted  . Acute on chronic combined systolic and diastolic CHF (congestive heart failure) ([place])  01/22/2019  . Perianal pain   . Acute metabolic encephalopathy [id number]  . UTI (urinary tract infection) 01/14/2019  . Protein-calorie malnutrition, severe 12/08/2018  . Goals of care, counseling/discussion   . 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 Syncope 11/26/2018  . Ventricular tachycardia ([place]) 11/26/2018  . Elevated INR 11/26/2018  . Acute on chronic systolic (congestive) heart failure ([place]) 10/27/2018  . Elevated troponin 10/26/2018  . Anemia 10/26/2018  . Palliative care by specialist   . Rectal bleeding 04/28/2017  . Acute blood loss anemia 04/28/2017  . ICD (implantable cardioverter-defibrillator), biventricular, in situ 08/24/2014  . Memory deficit 08/23/2014  . Abnormality of gait 08/23/2014  . Dementia ([place]) 12/17/2013  . 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 Hypotension due to drugs 07/12/2012  . Hypokalemia 07/12/2012  . NSVT (nonsustained ventricular tachycardia) ([place]) 07/12/2012  . Acute on chronic systolic heart failure ([place]) 07/07/2012  . Ventricular fibrillation ([place]) 11/11/2011  . Warfarin anticoagulation 09/22/2011  . Nonischemic cardiomyopathy ([place])   . Atrial fibrillation ([place])   . History of colon cancer   . Internal hemorrhoids   . Rheumatic heart disease   . Sick sinus syndrome ([place])   . Systolic CHF, chronic ([place])   . DIVERTICULAR DISEASE 06/05/2009    Past  Surgical History:  Procedure Laterality Date  . ABDOMINAL HYSTERECTOMY    . AV NODE ABLATION N/A 09/23/2011   Procedure: AV NODE ABLATION;  Surgeon: Evans Lance, MD;  Location: [facility] CATH LAB;  Service: Cardiovascular;  Laterality: N/A;  . BIV ICD GENERTAOR CHANGE OUT N/A 09/05/2014   Procedure: BIV ICD GENERTAOR CHANGE OUT;  Surgeon: Evans Lance, MD;  Location: [facility] CATH LAB;  Service: Cardiovascular;  Laterality: N/A;  . Biventricular AICD    . COLON SURGERY  1999  . History of echocardiogram  2003, 2007, 2009  . INCISION AND DRAINAGE PERIRECTAL ABSCESS Right 11/28/2018    Procedure: IRRIGATION AND DEBRIDEMENT PERIRECTAL ABSCESS;  Surgeon: Greer Pickerel, MD;  Location: [facility];  Service: General;  Laterality: Right;  . Mitral Valve Replacement, Bjork-Shiley valve  1983     OB History   No obstetric history on file.     Family History  Problem Relation Age of Onset  . Diabetes Mother 30  . Coronary artery disease Mother        s/p cabg  . Lung cancer Father 72       smoker  . Coronary artery disease Sister        s/p cabg    Social History   Tobacco Use  . Smoking status: Never Smoker  . Smokeless tobacco: Never Used  Substance Use Topics  . Alcohol use: No  . Drug use: No    Home Medications Prior to Admission medications   Medication Sig Start Date End Date Taking? Authorizing Provider  alendronate (FOSAMAX) 70 MG tablet Take 70 mg by mouth once a week. 08/26/19   [provider]  amiodarone (PACERONE) 200 MG tablet Take 1 tablet (200 mg total) by mouth daily. Patient not taking: Reported on 09/29/2019 02/24/19   Evans Lance, MD  carvedilol (COREG) 3.125 MG tablet Take 1 tablet (3.125 mg total) by mouth 2 (two) times daily with a meal. 02/08/19   Evans Lance, MD  digoxin (LANOXIN) 0.125 MG tablet Take 125 mcg by mouth daily. 08/26/19   [provider]  furosemide (LASIX) 20 MG tablet Take 20 mg by mouth daily. 07/20/19   [provider]  midodrine (PROAMATINE) 10 MG tablet Take 10 mg by mouth 2 (two) times daily.     [provider]  Multiple Vitamins-Minerals (AIRBORNE GUMMIES PO) Take 1 tablet by mouth daily.    [provider]  potassium chloride (K-DUR) 10 MEQ tablet Take 1 tablet (10 mEq total) by mouth daily for 30 days. Patient not taking: Reported on 09/29/2019 01/23/19 02/22/19  Darliss Cheney, MD  tiZANidine (ZANAFLEX) 4 MG tablet Take 4 mg by mouth daily as needed for muscle spasms.  09/28/19   [provider]  warfarin (COUMADIN) 1 MG tablet Take 4 mg by mouth See admin  instructions. Take 4 tablets (4mg ) by mouth all days except on Tues and Thurs    [provider]  warfarin (COUMADIN) 5 MG tablet Take 5 mg by mouth See admin instructions. Take 1 tablet (5mg ) by mouth on Tues and Thurs    [provider]    Allergies    Patient has no known allergies.  Review of Systems   Review of Systems  Constitutional: Negative for fever.  HENT: Negative for ear pain and sore throat.   Eyes: Negative for visual disturbance.  Respiratory: Negative for cough and shortness of breath.   Cardiovascular: Negative for chest pain.  Gastrointestinal: Negative for  abdominal pain, constipation, diarrhea, nausea and vomiting.  Genitourinary: Negative for dysuria and hematuria.  Musculoskeletal:       Left leg pain  Skin: Negative for rash.  Neurological: Negative for weakness, numbness and headaches.  All other systems reviewed and are negative.   Physical Exam Updated Vital Signs BP 127/82   Pulse 74   Temp 98.2 F (36.8 C) (Oral)   Resp 16   SpO2 100%   Physical Exam Vitals and nursing note reviewed.  Constitutional:      General: She is not in acute distress.    Appearance: She is well-developed.  HENT:     Head: Normocephalic and atraumatic.  Eyes:     Conjunctiva/sclera: Conjunctivae normal.  Cardiovascular:     Rate and Rhythm: Normal rate and regular rhythm.     Pulses:          Dorsalis pedis pulses are detected w/ Doppler on the right side and 0 on the left side.     Heart sounds: Normal heart sounds. No murmur.     Comments: Unable to detect DP/PT pulse with doppler on the LLE Pulmonary:     Effort: Pulmonary effort is normal. No respiratory distress.     Breath sounds: Normal breath sounds. No wheezing, rhonchi or rales.  Abdominal:     General: Bowel sounds are normal.     Palpations: Abdomen is soft.     Tenderness: There is no abdominal tenderness. There is no rebound.  Musculoskeletal:     Cervical back: Neck supple.      Comments: BLE are cool to the touch, there is no asymmetry with temperature  Skin:    General: Skin is warm and dry.  Neurological:     Mental Status: She is alert.     ED Results / Procedures / Treatments   Labs (all labs ordered are listed, but only abnormal results are displayed) Labs Reviewed  PROTIME-INR - Abnormal; Notable for the following components:      Result Value   Prothrombin Time 21.0 (*)    INR 1.8 (*)    All other components within normal limits  CBC WITH DIFFERENTIAL/PLATELET - Abnormal; Notable for the following components:   RDW 15.9 (*)    All other components within normal limits  BASIC METABOLIC PANEL - Abnormal; Notable for the following components:   CO2 21 (*)    All other components within normal limits  RESPIRATORY PANEL BY RT PCR (FLU A&B, COVID)    EKG None  Radiology VAS [place] LOWER EXTREMITY VENOUS (DVT) (ONLY [facility] & [facility])  Result Date: 10/01/2019  Lower Venous DVTStudy Indications: Pain.  Risk Factors: None identified. Limitations: Poor ultrasound/tissue interface and patient positioning, patient immobility, patient pain tolerance. Comparison Study: No prior studies. Performing Technologist: Oliver Hum RVT  Examination Guidelines: A complete evaluation includes B-mode imaging, spectral Doppler, color Doppler, and power Doppler as needed of all accessible portions of each vessel. Bilateral testing is considered an integral part of a complete examination. Limited examinations for reoccurring indications may be performed as noted. The reflux portion of the exam is performed with the patient in reverse Trendelenburg.  +-----+---------------+---------+-----------+----------+--------------+ RIGHTCompressibilityPhasicitySpontaneityPropertiesThrombus Aging +-----+---------------+---------+-----------+----------+--------------+ CFV  Full           Yes      Yes                                  +-----+---------------+---------+-----------+----------+--------------+   +---------+---------------+---------+-----------+----------+--------------+  LEFT     CompressibilityPhasicitySpontaneityPropertiesThrombus Aging +---------+---------------+---------+-----------+----------+--------------+ CFV      Full           Yes      Yes                                 +---------+---------------+---------+-----------+----------+--------------+ SFJ      Full                                                        +---------+---------------+---------+-----------+----------+--------------+ FV Prox  Full                                                        +---------+---------------+---------+-----------+----------+--------------+ FV Mid   Full                                                        +---------+---------------+---------+-----------+----------+--------------+ FV DistalFull                                                        +---------+---------------+---------+-----------+----------+--------------+ PFV      Full                                                        +---------+---------------+---------+-----------+----------+--------------+ POP      Full           Yes      Yes                                 +---------+---------------+---------+-----------+----------+--------------+ PTV      Full                                                        +---------+---------------+---------+-----------+----------+--------------+ PERO     Full                                                        +---------+---------------+---------+-----------+----------+--------------+ Arterial waveforms were absent in the popliteal, posterior tibial, peroneal, and anterior tibial arteries.    Summary: RIGHT: - No evidence of deep vein thrombosis in the lower extremity. No indirect evidence of obstruction proximal to the inguinal ligament.  LEFT: - There is no  evidence  of deep vein thrombosis in the lower extremity. However, portions of this examination were limited- see technologist comments above.  - Arterial waveforms were absent in the popliteal, posterior tibial, peroneal, and anterior tibial arteries.  *See table(s) above for measurements and observations.    Preliminary     Procedures Procedures (including critical care time)  CRITICAL CARE Performed by: Rodney Booze   Total critical care time: 35 minutes  Critical care time was exclusive of separately billable procedures and treating other patients.  Critical care was necessary to treat or prevent imminent or life-threatening deterioration.  Critical care was time spent personally by me on the following activities: development of treatment plan with patient and/or surrogate as well as nursing, discussions with consultants, evaluation of patient's response to treatment, examination of patient, obtaining history from patient or surrogate, ordering and performing treatments and interventions, ordering and review of laboratory studies, ordering and review of radiographic studies, pulse oximetry and re-evaluation of patient's condition.   Medications Ordered in ED Medications - No data to display  ED Course  I have reviewed the triage vital signs and the nursing notes.  Pertinent labs & imaging results that were available during my care of the patient were reviewed by me and considered in my medical decision making (see chart for details).    MDM Rules/Calculators/A&P                      74 y/o F presenting with LLE started a few days ago. H/o DVT  LE [place] ordered in triage was reviewed and was w/o evidence forvenous clot. Technologist noted that arterial waveforms were absent in the popliteal, posterior tibial, peroneal, and anterior tibial arteries  LLE DP/PT pulses unable to be dopplered. Vascular surgery consult placed.   Labs reviewed Cbc nonacute Bmp nonacute ptinr at  1.8  CONSULT with Dr. Oneida Alar with vascular surgery who will see pt in the ED. He recommended 2 hour COVID swab.   Final Clinical Impression(s) / ED Diagnoses Final diagnoses:  None    Rx / DC Orders ED Discharge Orders    None       Rodney Booze, PA-C 10/01/19 1636    Rodney Booze, PA-C 10/01/19 1637    Blanchie Dessert, MD 10/04/19 0840

## 2019-10-01 NOTE — Anesthesia Postprocedure Evaluation (Signed)
Anesthesia Post Note  Patient: Angela [name]  Procedure(s) Performed: Left Popliteal Artery Thrombectomy with Vein Patch Angioplasty; Left Tibial Artery Thrombectomy (Left )     Patient location during evaluation: PACU Anesthesia Type: General Level of consciousness: awake Pain management: pain level controlled Vital Signs Assessment: post-procedure vital signs reviewed and stable Respiratory status: spontaneous breathing Cardiovascular status: stable Postop Assessment: no apparent nausea or vomiting Anesthetic complications: no    Last Vitals:  Vitals:   10/01/19 2053 10/01/19 2100  BP: 134/73 119/63  Pulse: 74 74  Resp: 16 15  Temp: (!) 36.4 C   SpO2: 99% 99%    Last Pain:  Vitals:   10/01/19 2053  TempSrc:   PainSc: 0-No pain                 [name]

## 2019-10-01 NOTE — Transfer of Care (Signed)
Immediate Anesthesia Transfer of Care Note  Patient: Angela [name]  Procedure(s) Performed: Left Popliteal Artery Thrombectomy with Vein Patch Angioplasty; Left Tibial Artery Thrombectomy (Left )  Patient Location: PACU  Anesthesia Type:General  Level of Consciousness: awake, alert , oriented and patient cooperative  Airway & Oxygen Therapy: Patient Spontanous Breathing  Post-op Assessment: Report given to RN and Post -op Vital signs reviewed and stable  Post vital signs: Reviewed and stable  Last Vitals:  Vitals Value Taken Time  BP 134/73 10/01/19 2054  Temp 36.4 C 10/01/19 2053  Pulse 73 10/01/19 2058  Resp 16 10/01/19 2058  SpO2 100 % 10/01/19 2058  Vitals shown include unvalidated device data.  Last Pain:  Vitals:   10/01/19 1403  TempSrc:   PainSc: 10-Worst pain ever         Complications: No apparent anesthesia complications

## 2019-10-01 NOTE — Anesthesia Procedure Notes (Signed)
Procedure Name: Intubation Date/Time: 10/01/2019 6:56 PM Performed by: Shirlyn Goltz, CRNA Pre-anesthesia Checklist: Patient identified, Emergency Drugs available, Suction available and Patient being monitored Patient Re-evaluated:Patient Re-evaluated prior to induction Oxygen Delivery Method: Circle system utilized Preoxygenation: Pre-oxygenation with 100% oxygen Induction Type: IV induction Ventilation: Mask ventilation without difficulty Laryngoscope Size: Mac and 3 Grade View: Grade I Tube type: Oral Tube size: 7.0 mm Number of attempts: 1 Airway Equipment and Method: Stylet Placement Confirmation: ETT inserted through vocal cords under direct vision,  positive ETCO2 and breath sounds checked- equal and bilateral Secured at: 20 cm Tube secured with: Tape Dental Injury: Teeth and Oropharynx as per pre-operative assessment

## 2019-10-01 NOTE — ED Triage Notes (Signed)
Pt  Here from home with c/o left calf pain , pt was here a few days ago to r/o chest pain and family forgot to mention that pt was having problems with her leg , pt does have a hx of DVT

## 2019-10-01 NOTE — Telephone Encounter (Signed)
Son reports that mom is having acute left leg pain.  On 2/3 went to the hospital about this, but leg pain wasn't addressed b/c by the time she got to hospital she was having some CP and they only concentrated on that, according to son.  Last night he thought pt was having a cramp, but every time he would try and rub it mom wouldn't let him b/c it was too painful to touch. States mom's left "leg was cold to the touch all the way down to her foot", they "put her foot in a tub of warm water and put heating pads around her leg -- this somewhat helped, and did get the circulation going and leg started to warm up".  The pain in the leg remained tho.  With no vascular hx, advised to contact PCP for further evaluation  (discussed this w/ nursing supervisor who was in agree ance to this plan given to son).  Son reports that mom's PCP office is closed today.  Advised to call their office and speak to whomever is covering for PCP and if unable to be seen, take mom to urgent care/ED today for further evaluation. Son is agreeable to plan.

## 2019-10-01 NOTE — Progress Notes (Signed)
ANTICOAGULATION CONSULT NOTE - Initial Consult  Pharmacy Consult for heparin Indication: L-leg embolus/AFib  No Known Allergies  Patient Measurements:   Heparin Dosing Weight: 60kg  Vital Signs: Temp: 98.2 F (36.8 C) (02/05 1136) Temp Source: Oral (02/05 1136) BP: 127/82 (02/05 1630) Pulse Rate: 74 (02/05 1630)  Labs: Recent Labs    09/29/19 0131 09/29/19 0402 10/01/19 1533  HGB 14.9  --  13.9  HCT 48.8*  --  45.4  PLT 164  --  185  LABPROT 15.0  --  21.0*  INR 1.2  --  1.8*  CREATININE 0.96  --  0.90  TROPONINIHS 176* 163*  --     CrCl cannot be calculated (Unknown ideal weight.).   Medical History: Past Medical History:  Diagnosis Date  . Abnormality of gait 08/23/2014  . Arthritis   . Atrial fibrillation ([place])    A.  Chronic Coumadin  . Cardiac arrest - ventricular fibrillation    A.  12/2007;  B. 01/2008 St. Jude Promote Bi-V ICD placed  . CVA (cerebral vascular accident) ([place])   . Diverticular disease   . DVT (deep venous thrombosis) ([place])   . 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 History of colon cancer    A.  1999 - T3, N1  chemotherapy  . Internal hemorrhoids   . Memory deficit 08/23/2014  . Nonischemic cardiomyopathy ([place])    a.  11/2001 - Cath - NL Cors; b. ECHO 09/22/11: EF 20%, MVR normal, moderate LAE, mild RAE c. ECHO (08/2012): ED 20%, diff HK, LA mod dilated, mild/mod TR, RV mild/mod decreased sys fx  . Rectal bleeding 04/28/2017  . Rheumatic heart disease    A. 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 Sick sinus syndrome ([place])    A.  s/p pacer in 1995.  B.    . Systolic CHF, chronic ([place])    A.  01/2008 Echo - EF 10-20%  . UTI (urinary tract infection) 01/19/2019   Assessment: 57 YOF presenting with acute leg pain, with left leg embolus and plan for embolectomy per [facility].  On warfarin PTA for AFib, INR subtherapeutic at 1.8 on admission.  CBC wnl.    Goal of Therapy:  Heparin level  0.3-0.7 units/ml Monitor platelets by anticoagulation protocol: Yes   Plan:  Heparin 3500 units IV x 1, and gtt at 950 units/hr F/u 8 hour heparin level F/u embolectomy   Bertis Ruddy, PharmD Clinical Pharmacist Please check AMION for all [facility] numbers 10/01/2019 4:41 PM

## 2019-10-02 LAB — BASIC METABOLIC PANEL
Anion gap: 12 (ref 5–15)
BUN: 18 mg/dL (ref 8–23)
CO2: 21 mmol/L — ABNORMAL LOW (ref 22–32)
Calcium: 9 mg/dL (ref 8.9–10.3)
Chloride: 107 mmol/L (ref 98–111)
Creatinine, Ser: 0.85 mg/dL (ref 0.44–1.00)
GFR calc Af Amer: >60 mL/min (ref >60)
GFR calc non Af Amer: >60 mL/min (ref >60)
Glucose, Bld: 147 mg/dL — ABNORMAL HIGH (ref 70–99)
Potassium: 4.4 mmol/L (ref 3.5–5.1)
Sodium: 140 mmol/L (ref 135–145)

## 2019-10-02 LAB — CBC
HCT: 40.3 % (ref 36.0–46.0)
HCT: 37.3 % (ref 36.0–46.0)
HCT: 35.7 % — ABNORMAL LOW (ref 36.0–46.0)
Hemoglobin: 12.8 g/dL (ref 12.0–15.0)
Hemoglobin: 11.7 g/dL — ABNORMAL LOW (ref 12.0–15.0)
Hemoglobin: 11.1 g/dL — ABNORMAL LOW (ref 12.0–15.0)
MCH: 30 pg (ref 26.0–34.0)
MCH: 29.9 pg (ref 26.0–34.0)
MCH: 30.2 pg (ref 26.0–34.0)
MCHC: 31.8 g/dL (ref 30.0–36.0)
MCHC: 31.4 g/dL (ref 30.0–36.0)
MCHC: 31.1 g/dL (ref 30.0–36.0)
MCV: 94.4 fL (ref 80.0–100.0)
MCV: 95.4 fL (ref 80.0–100.0)
MCV: 97.3 fL (ref 80.0–100.0)
Platelets: 162 10*3/uL (ref 150–400)
Platelets: 168 10*3/uL (ref 150–400)
Platelets: 159 10*3/uL (ref 150–400)
RBC: 4.27 MIL/uL (ref 3.87–5.11)
RBC: 3.91 MIL/uL (ref 3.87–5.11)
RBC: 3.67 MIL/uL — ABNORMAL LOW (ref 3.87–5.11)
RDW: 15.7 % — ABNORMAL HIGH (ref 11.5–15.5)
RDW: 15.8 % — ABNORMAL HIGH (ref 11.5–15.5)
RDW: 15.8 % — ABNORMAL HIGH (ref 11.5–15.5)
WBC: 6.8 10*3/uL (ref 4.0–10.5)
WBC: 8.1 10*3/uL (ref 4.0–10.5)
WBC: 12 10*3/uL — ABNORMAL HIGH (ref 4.0–10.5)
nRBC: 0 % (ref 0.0–0.2)
nRBC: 0 % (ref 0.0–0.2)
nRBC: 0 % (ref 0.0–0.2)

## 2019-10-02 LAB — HEPARIN LEVEL (UNFRACTIONATED): Heparin Unfractionated: 1.08 IU/mL — ABNORMAL HIGH (ref 0.30–0.70)

## 2019-10-02 MED ORDER — HEPARIN (PORCINE) 25000 UT/250ML-% IV SOLN: 750.0000 [IU]/h | INTRAVENOUS | Status: DC

## 2019-10-02 MED ORDER — CHLORHEXIDINE GLUCONATE CLOTH 2 % EX PADS
6.0000 | MEDICATED_PAD | Freq: Every day | CUTANEOUS | Status: DC
  Administered 2019-10-02 – 2019-10-10 (×8): 6 via TOPICAL

## 2019-10-02 MED ORDER — SODIUM CHLORIDE 0.9 % IV BOLUS
1000.0000 mL | Freq: Once | INTRAVENOUS | Status: AC
  Administered 2019-10-02: 1000 mL via INTRAVENOUS

## 2019-10-02 NOTE — Progress Notes (Signed)
Patient received via bed with PACU RN Alert and confused [name]. To room and R.N. into assess patient. Heparin was infusing from PACU  and Truman Hayward and myself vertified. Orders going at 9.46ml/hr.

## 2019-10-02 NOTE — Progress Notes (Signed)
ANTICOAGULATION CONSULT NOTE   Pharmacy Consult for heparin Indication: L-leg embolus/AFib  No Known Allergies  Patient Measurements: Height: 5\' 6"  (167.6 cm) Weight: 130 lb 4.7 oz (59.1 kg) IBW/kg (Calculated) : 59.3 Heparin Dosing Weight: 60kg  Vital Signs: Temp: 97.7 F (36.5 C) (02/06 0803) Temp Source: Oral (02/06 0803) BP: 107/72 (02/06 0803) Pulse Rate: 73 (02/06 0814)  Labs: Recent Labs    10/01/19 1533 10/01/19 1533 10/02/19 0116 10/02/19 0636  HGB 13.9   < > 12.8 11.7*  HCT 45.4  --  40.3 37.3  PLT 185  --  162 168  LABPROT 21.0*  --   --   --   INR 1.8*  --   --   --   HEPARINUNFRC  --   --  1.08*  --   CREATININE 0.90  --  0.85  --    < > = values in this interval not displayed.    Estimated Creatinine Clearance: 53.4 mL/min (by C-G formula based on SCr of 0.85 mg/dL).   Assessment: 69 YOF presenting with acute leg pain, with left leg embolus and s/p embolectomy 2/5 pm.  On warfarin PTA for AFib, INR subtherapeutic at 1.8 on admission.  CBC wnl.    JP drain bleeding (485 ml since heparin stopped at 02:15). Spoke to Dr. Oneida Alar and plan to continue holding heparin for now until bleeding resolves. Repeat CBC shows Hg drop from 13.9>11.7, Plts are wnls.  Goal of Therapy:  Monitor platelets by anticoagulation protocol: Yes   Plan:  F/u heparin level post restart F/u further bleeding Daily CBC  Sherren Kerns, PharmD PGY1 [facility] Resident Please see amion for complete clinical pharmacist phone list 10/02/2019 8:45 AM

## 2019-10-02 NOTE — Progress Notes (Signed)
Dr. Oneida Alar called and made aware that Patient [name] drain  Has drained 390 ml of blood since arrival from PACU at 10 p.m. R.N. also aware. See orders to stop Heparin for 6 hours and restart at 08:15 a.m. at same rate.

## 2019-10-02 NOTE — Evaluation (Signed)
Occupational Therapy Evaluation Patient Details Name: Angela [name] MRN: [id number] DOB: 07-10-1946 Today's Date: 10/02/2019    History of Present Illness 74 year old female with a history of A. fib (on Coumadin), DVT, nonischemic cardiomyopathy, rheumatic heart disease, sinus sick syndrome, CHF, who presented to the emergency department complaining of left lower extremity pain. This is been present for the last several days. She was seen in the ED a few days ago for chest pain. He also notes that she was complaining that her left leg was cold. Pt found to have thrombus left popliteal artery. She underwent left popliteal and tibial embolectomy 10/01/19.   Clinical Impression   Patient admitted for above and limited by problem list below, including impaired balance, decreased activity tolerance, L LE pain and limited ROM, and impaired cognition. She currently requires min assist for bed mobility, min assist +2 safety for transfers using RW, min assist for UB ADLs and max-total assist for LB ADLs.  Pt with limited history of PLOF, known memory deficits per chart, but reports completing mobility with cane and ADLs with some assist for bathing only.  She follows commands today with increased time, but is disoriented to situation (believing she had an amputation of L LE), and requires choices for time.  She will benefit from continued OT services while admitted and after dc at SNF level in order to optimize independence and safety with ADLs, mobility and transfers.     Follow Up Recommendations  SNF;Supervision/Assistance - 24 hour(max HH services if declines SNF (OT, PT, aide) )    Equipment Recommendations  3 in 1 bedside commode    Recommendations for Other Services       Precautions / Restrictions Precautions Precautions: Fall;Other (comment) Precaution Comments: drain L distal LE Restrictions Weight Bearing Restrictions: No      Mobility Bed Mobility Overal bed mobility: Needs  Assistance Bed Mobility: Supine to Sit     Supine to sit: Min assist;HOB elevated     General bed mobility comments: increased time to complete, min assist to mgmt of L LE and trunk; continuous multimodal cueing   Transfers Overall transfer level: Needs assistance Equipment used: Rolling walker (2 wheeled) Transfers: Sit to/from [facility] Sit to Stand: Min assist;From elevated surface Stand pivot transfers: +2 safety/equipment;Min assist       General transfer comment: cues for hand placement and sequencing, assist to power up and stabilize balance, decreased weight shift to left during pivot transfer toward right bed to recliner    Balance Overall balance assessment: Needs assistance Sitting-balance support: No upper extremity supported;Feet supported Sitting balance-Leahy Scale: Fair     Standing balance support: Bilateral upper extremity supported;During functional activity Standing balance-Leahy Scale: Poor Standing balance comment: reliant on external support                           ADL either performed or assessed with clinical judgement   ADL Overall ADL's : Needs assistance/impaired Eating/Feeding: Set up;Sitting   Grooming: Minimal assistance;Sitting   Upper Body Bathing: Minimal assistance;Sitting   Lower Body Bathing: Maximal assistance;Sit to/from stand   Upper Body Dressing : Minimal assistance;Sitting   Lower Body Dressing: Total assistance;Sit to/from stand Lower Body Dressing Details (indicate cue type and reason): decreased reach to feet, L LE pain, and impaired balance; min assist sit to stand  Toilet Transfer: Minimal assistance;Stand-pivot;RW;+2 for safety/equipment Toilet Transfer Details (indicate cue type and reason): simulated to recliner  Toileting- Clothing Manipulation  and Hygiene: Sit to/from stand;Maximal assistance       Functional mobility during ADLs: Minimal assistance;Rolling walker;Cueing for  sequencing;Cueing for safety General ADL Comments: pt limited by pain, impaired balance, generalized weakness, decreased activity tolerance and cognition     Vision   Vision Assessment?: No apparent visual deficits     Perception     Praxis      Pertinent Vitals/Pain Pain Assessment: Faces Faces Pain Scale: Hurts even more Pain Location: LLE with mobility Pain Descriptors / Indicators: Grimacing;Guarding;Discomfort Pain Intervention(s): Limited activity within patient's tolerance;Monitored during session;Repositioned     Hand Dominance Right   Extremity/Trunk Assessment Upper Extremity Assessment Upper Extremity Assessment: Generalized weakness   Lower Extremity Assessment Lower Extremity Assessment: Defer to PT evaluation(s/p L LE surgery ) LLE: Unable to fully assess due to pain   Cervical / Trunk Assessment Cervical / Trunk Assessment: Kyphotic   Communication Communication Communication: No difficulties   Cognition Arousal/Alertness: Awake/alert Behavior During Therapy: Anxious(mildly anxious/apprehensive ) Overall Cognitive Status: No family/caregiver present to determine baseline cognitive functioning                                 General Comments: decreased awareness to situation (reports "they took my leg") and reoriented to situation, able to verbalize month and year with choices only; reports living with her husband and son (but per chart review her son recently passed away); per chart hx of memory deficit    General Comments  VSS; L LE dressing saturated and RN aware (changing after session)    Exercises     Shoulder Instructions      Home Living Family/patient expects to be discharged to:: Private residence Living Arrangements: Children(son) Available Help at Discharge: Family;Available 24 hours/day Type of Home: House Home Access: Stairs to enter [employer] of Steps: 2 Entrance Stairs-Rails: Right;Left Home Layout: One  level     Bathroom Shower/Tub: Tub/shower unit         Home Equipment: Environmental consultant - 2 wheels;Cane - quad;Shower seat;Wheelchair - manual   Additional Comments: Pt reports husband is at home; however, per chart husband recently passed.      Prior Functioning/Environment Level of Independence: Independent with assistive device(s)        Comments: patinet reports using cane and needing intermittent assist with bathing, but unclear accuracy due to poor historian         OT Problem List: Decreased strength;Decreased activity tolerance;Impaired balance (sitting and/or standing);Decreased cognition;Decreased coordination;Decreased safety awareness;Decreased knowledge of use of DME or AE;Decreased knowledge of precautions;Pain      OT Treatment/Interventions: Self-care/ADL training;Energy conservation;DME and/or AE instruction;Therapeutic activities;Cognitive remediation/compensation;Patient/family education;Balance training    OT Goals(Current goals can be found in the care plan section) Acute Rehab OT Goals Patient Stated Goal: home OT Goal Formulation: With patient Time For Goal Achievement: 10/16/19 Potential to Achieve Goals: Good  OT Frequency: Min 2X/week   Barriers to D/C:            Co-evaluation PT/OT/SLP Co-Evaluation/Treatment: Yes Reason for Co-Treatment: For patient/therapist safety;To address functional/ADL transfers   OT goals addressed during session: ADL's and self-care      AM-PAC OT "6 Clicks" Daily Activity     Outcome Measure Help from another person eating meals?: A Little Help from another person taking care of personal grooming?: A Little Help from another person toileting, which includes using toliet, bedpan, or urinal?: A Lot Help from another person  bathing (including washing, rinsing, drying)?: A Lot Help from another person to put on and taking off regular upper body clothing?: A Little Help from another person to put on and taking off regular  lower body clothing?: Total 6 Click Score: 14   End of Session Equipment Utilized During Treatment: Gait belt;Rolling walker Nurse Communication: Mobility status  Activity Tolerance: Patient tolerated treatment well Patient left: in chair;with call bell/phone within reach;with chair alarm set;with nursing/sitter in room  OT Visit Diagnosis: Other abnormalities of gait and mobility (R26.89);Muscle weakness (generalized) (M62.81);Pain;Other symptoms and signs involving cognitive function Pain - Right/Left: Left Pain - part of body: Leg                Time: [id number] OT Time Calculation (min): 21 min Charges:  OT General Charges $OT Visit: 1 Visit OT Evaluation $OT Eval Moderate Complexity: 1 Mod  Jolaine Artist, OT Acute Rehabilitation Services Pager [phone] Office [phone]    Delight Stare 10/02/2019, 11:03 AM

## 2019-10-02 NOTE — Progress Notes (Signed)
Have emtpied another 375 ml of blood from [name] drain and 2 dsg changes since stop heparin at 02:15 a.m. [employer]. aware V.S.S. patient voiced no complaints . Advise to coct. To monitor output and patient.

## 2019-10-02 NOTE — Progress Notes (Signed)
[facility]  Subjective  - left foot feels better   Objective 107/72 73 97.7 F (36.5 C) (Oral) 19 100%  Intake/Output Summary (Last 24 hours) at 10/02/2019 [id number] Last data filed at 10/02/2019 0719 Gross per 24 hour  Intake 648 ml  Output 2090 ml  Net -1442 ml   Biphasic DP PT doppler left foot Monophasic right foot 2+ radial pulses Left leg compartments soft  Jp about 500 cc blood overnight  Assessment/Planning: S/p left popliteal and tibial embolectomy most likely afib cardiac source embolus Acute blood loss anemia with oozing from incision and JP.  Will try to resume heparin around noon.  Keep JP for now  Will resume warfarin early next week  Ruta Hinds 10/02/2019 9:29 AM --  Laboratory Lab Results: Recent Labs    10/02/19 0116 10/02/19 0636  WBC 6.8 8.1  HGB 12.8 11.7*  HCT 40.3 37.3  PLT 162 168   BMET Recent Labs    10/01/19 1533 10/02/19 0116  NA 137 140  K 4.3 4.4  CL 104 107  CO2 21* 21*  GLUCOSE 99 147*  BUN 20 18  CREATININE 0.90 0.85  CALCIUM 9.3 9.0    COAG Lab Results  Component Value Date   INR 1.8 (H) 10/01/2019   INR 1.2 09/29/2019   INR 2.7 (H) 01/23/2019   No results found for: PTT

## 2019-10-02 NOTE — Progress Notes (Signed)
[facility]  Subjective  - pt with hematoma left leg JP output decreased   Objective 93/65 73 97.7 F (36.5 C) (Oral) 20 100%  Intake/Output Summary (Last 24 hours) at 10/02/2019 1250 Last data filed at 10/02/2019 1131 Gross per 24 hour  Intake 648 ml  Output 2125 ml  Net -1477 ml   Hematoma left leg compartments still soft although leg does have more swelling  Assessment/Planning: Hematoma left leg hold on heparin for today Will resume tomorrow if hemoglobin is stable Currently no need for transfusion Cbc in am.  If continues to drift may reexplore in am  Ruta Hinds 10/02/2019 12:50 PM --  Laboratory Lab Results: Recent Labs    10/02/19 0636 10/02/19 1154  WBC 8.1 12.0*  HGB 11.7* 11.1*  HCT 37.3 35.7*  PLT 168 159   BMET Recent Labs    10/01/19 1533 10/02/19 0116  NA 137 140  K 4.3 4.4  CL 104 107  CO2 21* 21*  GLUCOSE 99 147*  BUN 20 18  CREATININE 0.90 0.85  CALCIUM 9.3 9.0    COAG Lab Results  Component Value Date   INR 1.8 (H) 10/01/2019   INR 1.2 09/29/2019   INR 2.7 (H) 01/23/2019   No results found for: PTT

## 2019-10-02 NOTE — Progress Notes (Signed)
Patient [name] drain has drained 485 ml since stop heparin at 02:15 this a.m. with 3 dressing changes since then . V.S.S. stable. patient voices no complaints. Total [name] Drainage so far is 875 ml with 6 dressing changes since arrival to [place]. Dr fields made aware and order CBC.

## 2019-10-02 NOTE — Progress Notes (Addendum)
ANTICOAGULATION CONSULT NOTE   Pharmacy Consult for heparin Indication: L-leg embolus/AFib  No Known Allergies  Patient Measurements: Height: 5\' 6"  (167.6 cm) Weight: 132 lb 15 oz (60.3 kg) IBW/kg (Calculated) : 59.3 Heparin Dosing Weight: 60kg  Vital Signs: Temp: 97.7 F (36.5 C) (02/06 0150) Temp Source: Oral (02/06 0150) BP: 121/74 (02/06 0200) Pulse Rate: 75 (02/06 0200)  Labs: Recent Labs    09/29/19 0402 10/01/19 1533 10/02/19 0116  HGB  --  13.9 12.8  HCT  --  45.4 40.3  PLT  --  185 162  LABPROT  --  21.0*  --   INR  --  1.8*  --   HEPARINUNFRC  --   --  1.08*  CREATININE  --  0.90 0.85  TROPONINIHS 163*  --   --     Estimated Creatinine Clearance: 53.5 mL/min (by C-G formula based on SCr of 0.85 mg/dL).   Assessment: Angela [name] presenting with acute leg pain, with left leg embolus and s/p embolectomy 2/5 pm.  On warfarin PTA for AFib, INR subtherapeutic at 1.8 on admission.  CBC wnl.    JP drain bleeding (390 ml since 2200). RN called and Dr. Oneida Alar wants to hold heparin x 6 hours then restart (heparin to be held 0215-0815). Heparin level 1.08 (supratherapeutic) - this level was drawn before heparin was stopped so should be accurate on 950 units/hr.   Goal of Therapy:  Heparin level 0.3-0.7 units/ml Monitor platelets by anticoagulation protocol: Yes   Plan:  At [place], will restart heparin at lower rate 750 units/hr F/u 8 hr heparin level post restart F/u further bleeding  Sherlon Handing, PharmD, BCPS Please see amion for complete clinical pharmacist phone list 10/02/2019 2:36 AM

## 2019-10-02 NOTE — Progress Notes (Signed)
Pt. Was sitting on bedside commode felt dizzy and nauseas BP 60/47. Moved to bed called [facility] and Dr. Oneida Alar. Order for 1 L bolus and CBC. BP now 106/69. Pt. States increased pain and swelling in left leg. Dr. Oneida Alar aware. Will continue to monitor.  Paulene Floor, RN

## 2019-10-02 NOTE — Significant Event (Signed)
[facility] Event Note  Overview: Time Called: [id number] Arrival Time: 1104 Event Type: Hypotension  Initial Focused Assessment: Patient sp surgery last evening, over night had about 875cc blood loss via JP drain, heparin gtt was stopped and H&H resulted: hgb 13.9 -->12.8-->11.7  hct 45.4-->40.3-->37.3 This morning the JP drain has a large clot and has only drained 45cc since 7am. Per RN she has just been up to the [facility] and became dizzy and nauseated.  BP 60/47 Upon my arrival she is lying in the bed BP 88/54  Paced  HR 73  RR 18  O2 sat 100% on RA She has increased pain in her calf/lower leg  And it is swollen and tight. Doppler DP pulse is the same per RN as earlier assessment.  Interventions: Dr Oneida Alar notified of event and pt status. 1L NS bolus given PO midodrine given BP 98/67  HR 76  Dr Oneida Alar at bedside to assess patient CBC ordered  Son at bedside after event   Plan of Care (if not transferred): Trend CBC RN to call if patient's leg become more swollen/tight or if it becomes difficult to doppler the pulse RN to call if patient becomes hypotensive again  Event Summary: Name of Physician Notified: Dr Ruta Hinds at 1100    at       Event End Time: [place]  Raliegh Ip

## 2019-10-02 NOTE — Progress Notes (Signed)
CSW unable to complete assessment. CSW left voice message for patient's son to return call.   Thurmond Butts, MSW, [place] Clinical Social Worker

## 2019-10-02 NOTE — Progress Notes (Signed)
Spoke with rapid response R.N. made aware of Tonight events. Advise to cont . To monitor patient is stable and will call M.D. in A.M. and update him.

## 2019-10-02 NOTE — Evaluation (Signed)
Physical Therapy Evaluation Patient Details Name: Angela [name] MRN: [id number] DOB: 07/15/1946 Today's Date: 10/02/2019   History of Present Illness  74 year old female with a history of A. fib (on Coumadin), DVT, nonischemic cardiomyopathy, rheumatic heart disease, sinus sick syndrome, CHF, who presented to the emergency department complaining of left lower extremity pain. This is been present for the last several days. She was seen in the ED a few days ago for chest pain. He also notes that she was complaining that her left leg was cold. Pt found to have thrombus left popliteal artery. She underwent left popliteal and tibial embolectomy 10/01/19.    Clinical Impression  Pt admitted with above diagnosis. PTA pt lived at home with her son. Pt reports she ambulated with cane. PT eval completed during ED visit 2/3 in which SNF was recommended. Family declined. Pt was discharged home with wheelchair and HHPT. On eval, pt required min assist bed mobility, min assist sit to stand with RW, and min assist +2 safety SPT with RW bed to recliner. Mobility limited by LLE pain.  Pt currently with functional limitations due to the deficits listed below (see PT Problem List). Pt will benefit from skilled PT to increase their independence and safety with mobility to allow discharge to the venue listed below.       Follow Up Recommendations SNF;Supervision/Assistance - 24 hour(If family refuses, will need HH services.)    Equipment Recommendations  None recommended by PT    Recommendations for Other Services       Precautions / Restrictions Precautions Precautions: Fall;Other (comment) Precaution Comments: drain L distal LE      Mobility  Bed Mobility Overal bed mobility: Needs Assistance Bed Mobility: Supine to Sit     Supine to sit: Min assist;HOB elevated     General bed mobility comments: +rail, needed significantly increased time to complete at min assist level, continuous multi modal  cues  Transfers Overall transfer level: Needs assistance Equipment used: Rolling walker (2 wheeled) Transfers: Sit to/from [facility] Sit to Stand: Min assist;From elevated surface Stand pivot transfers: +2 safety/equipment;Min assist       General transfer comment: cues for hand placement and sequencing, assist to power up and stabilize balance, decreased weight shift to left during pivot transfer toward right bed to recliner  Ambulation/Gait             General Gait Details: unable due to pain  Stairs            Wheelchair Mobility    Modified Rankin (Stroke Patients Only)       Balance Overall balance assessment: Needs assistance Sitting-balance support: No upper extremity supported;Feet supported Sitting balance-Leahy Scale: Fair     Standing balance support: Bilateral upper extremity supported;During functional activity Standing balance-Leahy Scale: Poor Standing balance comment: reliant on external support                             Pertinent Vitals/Pain Pain Assessment: Faces Faces Pain Scale: Hurts even more Pain Location: LLE with mobility Pain Descriptors / Indicators: Grimacing;Guarding;Discomfort Pain Intervention(s): Limited activity within patient's tolerance;Repositioned    Home Living Family/patient expects to be discharged to:: Private residence Living Arrangements: Children(son) Available Help at Discharge: Family;Available 24 hours/day Type of Home: House Home Access: Stairs to enter Entrance Stairs-Rails: Psychiatric nurse of Steps: 2 Home Layout: One level Home Equipment: Walker - 2 wheels;Cane - quad;Shower seat;Wheelchair - manual Additional Comments:  Pt reports husbans is at home; however, per chart husband recently passed.    Prior Function Level of Independence: Independent with assistive device(s)         Comments: Pt reports using a cane for ambulation. Unsure of accuracy.  (status prior to 2/3 ED visit)     Hand Dominance   Dominant Hand: Right    Extremity/Trunk Assessment   Upper Extremity Assessment Upper Extremity Assessment: Defer to OT evaluation    Lower Extremity Assessment Lower Extremity Assessment: Generalized weakness;LLE deficits/detail LLE: Unable to fully assess due to pain    Cervical / Trunk Assessment Cervical / Trunk Assessment: Kyphotic  Communication   Communication: No difficulties  Cognition Arousal/Alertness: Awake/alert Behavior During Therapy: Anxious(mildly anxious/apprehensive) Overall Cognitive Status: No family/caregiver present to determine baseline cognitive functioning                                 General Comments: When asked why she was in the hospital, pt responded "because they took my leg." Pt advised that her leg was not amputated but she did have surgery. Disoriented to time. Reported living with her husband and son. However, husband resently passed according to chart.      General Comments General comments (skin integrity, edema, etc.): VSS    Exercises     Assessment/Plan    PT Assessment Patient needs continued PT services  PT Problem List Decreased strength;Decreased balance;Decreased mobility;Decreased cognition;Decreased knowledge of use of DME;Decreased knowledge of precautions;Decreased safety awareness;Pain       PT Treatment Interventions Stair training;Gait training;DME instruction;Functional mobility training;Therapeutic activities;Therapeutic exercise;Balance training;Patient/family education    PT Goals (Current goals can be found in the Care Plan section)  Acute Rehab PT Goals Patient Stated Goal: home PT Goal Formulation: With patient Time For Goal Achievement: 10/16/19 Potential to Achieve Goals: Good    Frequency Min 3X/week   Barriers to discharge        Co-evaluation               AM-PAC PT "6 Clicks" Mobility  Outcome Measure Help needed  turning from your back to your side while in a flat bed without using bedrails?: A Little Help needed moving from lying on your back to sitting on the side of a flat bed without using bedrails?: A Little Help needed moving to and from a bed to a chair (including a wheelchair)?: A Little Help needed standing up from a chair using your arms (e.g., wheelchair or bedside chair)?: A Little Help needed to walk in hospital room?: A Lot Help needed climbing 3-5 steps with a railing? : Total 6 Click Score: 15    End of Session Equipment Utilized During Treatment: Gait belt Activity Tolerance: Patient tolerated treatment well Patient left: in chair;with call bell/phone within reach;with chair alarm set Nurse Communication: Mobility status PT Visit Diagnosis: Unsteadiness on feet (R26.81);Muscle weakness (generalized) (M62.81);Pain;Difficulty in walking, not elsewhere classified (R26.2) Pain - Right/Left: Left Pain - part of body: Leg    Time: [id number] PT Time Calculation (min) (ACUTE ONLY): 22 min   Charges:   PT Evaluation $PT Eval Moderate Complexity: 1 Mod          Lorrin Goodell, PT  Office # [phone] Pager [phone]   Lorriane Shire 10/02/2019, 9:48 AM

## 2019-10-03 ENCOUNTER — Inpatient Hospital Stay (HOSPITAL_COMMUNITY): Payer: Medicare Other | Admitting: Registered Nurse

## 2019-10-03 ENCOUNTER — Encounter (HOSPITAL_COMMUNITY)
Admission: EM | Disposition: A | Discharge status: Home Health | Payer: Self-pay | Source: Home / Self Care | Attending: Vascular Surgery

## 2019-10-03 DIAGNOSIS — I97638 Postprocedural hematoma of a circulatory system organ or structure following other circulatory system procedure: Secondary | ICD-10-CM

## 2019-10-03 HISTORY — PX: I & D EXTREMITY: SHX5045

## 2019-10-03 LAB — CBC
HCT: 31 % — ABNORMAL LOW (ref 36.0–46.0)
Hemoglobin: 9.9 g/dL — ABNORMAL LOW (ref 12.0–15.0)
MCH: 29.9 pg (ref 26.0–34.0)
MCHC: 31.9 g/dL (ref 30.0–36.0)
MCV: 93.7 fL (ref 80.0–100.0)
Platelets: 155 10*3/uL (ref 150–400)
RBC: 3.31 MIL/uL — ABNORMAL LOW (ref 3.87–5.11)
RDW: 15.9 % — ABNORMAL HIGH (ref 11.5–15.5)
WBC: 9.6 10*3/uL (ref 4.0–10.5)
nRBC: 0 % (ref 0.0–0.2)

## 2019-10-03 LAB — BASIC METABOLIC PANEL
Anion gap: 12 (ref 5–15)
BUN: 19 mg/dL (ref 8–23)
CO2: 20 mmol/L — ABNORMAL LOW (ref 22–32)
Calcium: 8.3 mg/dL — ABNORMAL LOW (ref 8.9–10.3)
Chloride: 106 mmol/L (ref 98–111)
Creatinine, Ser: 0.96 mg/dL (ref 0.44–1.00)
GFR calc Af Amer: >60 mL/min (ref >60)
GFR calc non Af Amer: 58 mL/min — ABNORMAL LOW (ref >60)
Glucose, Bld: 114 mg/dL — ABNORMAL HIGH (ref 70–99)
Potassium: 4.1 mmol/L (ref 3.5–5.1)
Sodium: 138 mmol/L (ref 135–145)

## 2019-10-03 SURGERY — IRRIGATION AND DEBRIDEMENT EXTREMITY
Anesthesia: General | Laterality: Left

## 2019-10-03 MED ORDER — ROCURONIUM BROMIDE 10 MG/ML (PF) SYRINGE
PREFILLED_SYRINGE | INTRAVENOUS | Status: AC
  Filled 2019-10-03: qty 10

## 2019-10-03 MED ORDER — PROPOFOL 10 MG/ML IV BOLUS
INTRAVENOUS | Status: AC
  Filled 2019-10-03: qty 20

## 2019-10-03 MED ORDER — FENTANYL CITRATE (PF) 100 MCG/2ML IJ SOLN
INTRAMUSCULAR | Status: AC
  Filled 2019-10-03: qty 2

## 2019-10-03 MED ORDER — PHENYLEPHRINE 40 MCG/ML (10ML) SYRINGE FOR IV PUSH (FOR BLOOD PRESSURE SUPPORT)
PREFILLED_SYRINGE | INTRAVENOUS | Status: AC
  Filled 2019-10-03: qty 20

## 2019-10-03 MED ORDER — FENTANYL CITRATE (PF) 250 MCG/5ML IJ SOLN
INTRAMUSCULAR | Status: AC
  Filled 2019-10-03: qty 5

## 2019-10-03 MED ORDER — SUCCINYLCHOLINE CHLORIDE 200 MG/10ML IV SOSY
PREFILLED_SYRINGE | INTRAVENOUS | Status: AC
  Filled 2019-10-03: qty 10

## 2019-10-03 MED ORDER — STERILE WATER FOR INJECTION IJ SOLN
INTRAMUSCULAR | Status: DC | PRN
  Administered 2019-10-03: 1000 mL

## 2019-10-03 MED ORDER — ETOMIDATE 2 MG/ML IV SOLN
INTRAVENOUS | Status: DC | PRN
  Administered 2019-10-03: 10 mg via INTRAVENOUS

## 2019-10-03 MED ORDER — SODIUM CHLORIDE 0.9 % IV SOLN
1.5000 g | INTRAVENOUS | Status: AC
  Administered 2019-10-03: 1.5 g via INTRAVENOUS
  Filled 2019-10-03: qty 1.5

## 2019-10-03 MED ORDER — LACTATED RINGERS IV SOLN: INTRAVENOUS | Status: DC | PRN

## 2019-10-03 MED ORDER — DEXAMETHASONE SODIUM PHOSPHATE 10 MG/ML IJ SOLN
INTRAMUSCULAR | Status: DC | PRN
  Administered 2019-10-03: 5 mg via INTRAVENOUS

## 2019-10-03 MED ORDER — PHENYLEPHRINE 40 MCG/ML (10ML) SYRINGE FOR IV PUSH (FOR BLOOD PRESSURE SUPPORT)
PREFILLED_SYRINGE | INTRAVENOUS | Status: DC | PRN
  Administered 2019-10-03: 120 ug via INTRAVENOUS
  Administered 2019-10-03: 80 ug via INTRAVENOUS
  Administered 2019-10-03 (×2): 120 ug via INTRAVENOUS
  Administered 2019-10-03: 80 ug via INTRAVENOUS

## 2019-10-03 MED ORDER — DEXAMETHASONE SODIUM PHOSPHATE 10 MG/ML IJ SOLN
INTRAMUSCULAR | Status: AC
  Filled 2019-10-03: qty 1

## 2019-10-03 MED ORDER — LIDOCAINE 2% (20 MG/ML) 5 ML SYRINGE
INTRAMUSCULAR | Status: DC | PRN
  Administered 2019-10-03: 60 mg via INTRAVENOUS

## 2019-10-03 MED ORDER — LIDOCAINE 2% (20 MG/ML) 5 ML SYRINGE
INTRAMUSCULAR | Status: AC
  Filled 2019-10-03: qty 5

## 2019-10-03 MED ORDER — HEPARIN (PORCINE) 25000 UT/250ML-% IV SOLN
800.0000 [IU]/h | INTRAVENOUS | Status: DC
  Administered 2019-10-03: 700 [IU]/h via INTRAVENOUS
  Administered 2019-10-04: 800 [IU]/h via INTRAVENOUS
  Administered 2019-10-06: 950 [IU]/h via INTRAVENOUS
  Administered 2019-10-07 – 2019-10-11 (×4): 850 [IU]/h via INTRAVENOUS
  Administered 2019-10-12: 800 [IU]/h via INTRAVENOUS
  Filled 2019-10-03 (×8): qty 250

## 2019-10-03 MED ORDER — FENTANYL CITRATE (PF) 100 MCG/2ML IJ SOLN
25.0000 ug | INTRAMUSCULAR | Status: DC | PRN
  Administered 2019-10-03: 25 ug via INTRAVENOUS
  Administered 2019-10-03: 50 ug via INTRAVENOUS
  Administered 2019-10-03: 25 ug via INTRAVENOUS

## 2019-10-03 MED ORDER — FENTANYL CITRATE (PF) 250 MCG/5ML IJ SOLN
INTRAMUSCULAR | Status: DC | PRN
  Administered 2019-10-03: 25 ug via INTRAVENOUS
  Administered 2019-10-03: 50 ug via INTRAVENOUS
  Administered 2019-10-03: 25 ug via INTRAVENOUS

## 2019-10-03 MED ORDER — ONDANSETRON HCL 4 MG/2ML IJ SOLN
INTRAMUSCULAR | Status: AC
  Filled 2019-10-03: qty 2

## 2019-10-03 MED ORDER — ONDANSETRON HCL 4 MG/2ML IJ SOLN
INTRAMUSCULAR | Status: DC | PRN
  Administered 2019-10-03: 4 mg via INTRAVENOUS

## 2019-10-03 MED ORDER — 0.9 % SODIUM CHLORIDE (POUR BTL) OPTIME
TOPICAL | Status: DC | PRN
  Administered 2019-10-03: 1000 mL

## 2019-10-03 MED ORDER — SUCCINYLCHOLINE CHLORIDE 200 MG/10ML IV SOSY
PREFILLED_SYRINGE | INTRAVENOUS | Status: DC | PRN
  Administered 2019-10-03: 120 mg via INTRAVENOUS

## 2019-10-03 MED ORDER — ALBUMIN HUMAN 5 % IV SOLN: INTRAVENOUS | Status: DC | PRN

## 2019-10-03 SURGICAL SUPPLY — 40 items
BNDG ELASTIC 4X5.8 VLCR STR LF (GAUZE/BANDAGES/DRESSINGS) IMPLANT
BNDG ELASTIC 6X5.8 VLCR STR LF (GAUZE/BANDAGES/DRESSINGS) IMPLANT
BNDG GAUZE ELAST 4 BULKY (GAUZE/BANDAGES/DRESSINGS) IMPLANT
CANISTER SUCT 3000ML PPV (MISCELLANEOUS) ×2 IMPLANT
COVER SURGICAL LIGHT HANDLE (MISCELLANEOUS) ×2 IMPLANT
COVER WAND RF STERILE (DRAPES) IMPLANT
DERMABOND ADVANCED (GAUZE/BANDAGES/DRESSINGS) ×1
DERMABOND ADVANCED .7 DNX12 (GAUZE/BANDAGES/DRESSINGS) ×1 IMPLANT
ELECT REM PT RETURN 9FT ADLT (ELECTROSURGICAL) ×2
ELECTRODE REM PT RTRN 9FT ADLT (ELECTROSURGICAL) ×1 IMPLANT
GAUZE SPONGE 4X4 12PLY STRL (GAUZE/BANDAGES/DRESSINGS) ×2 IMPLANT
GAUZE SPONGE 4X4 12PLY STRL LF (GAUZE/BANDAGES/DRESSINGS) ×2 IMPLANT
GLOVE BIO SURGEON STRL SZ7.5 (GLOVE) ×2 IMPLANT
GLOVE BIOGEL PI IND STRL 6 (GLOVE) ×1 IMPLANT
GLOVE BIOGEL PI IND STRL 6.5 (GLOVE) ×1 IMPLANT
GLOVE BIOGEL PI IND STRL 7.5 (GLOVE) ×1 IMPLANT
GLOVE BIOGEL PI INDICATOR 6 (GLOVE) ×1
GLOVE BIOGEL PI INDICATOR 6.5 (GLOVE) ×1
GLOVE BIOGEL PI INDICATOR 7.5 (GLOVE) ×1
GLOVE SURG SS PI 7.5 STRL IVOR (GLOVE) ×2 IMPLANT
GOWN STRL REUS W/ TWL LRG LVL3 (GOWN DISPOSABLE) ×3 IMPLANT
GOWN STRL REUS W/TWL LRG LVL3 (GOWN DISPOSABLE) ×3
KIT BASIN OR (CUSTOM PROCEDURE TRAY) ×2 IMPLANT
KIT TURNOVER KIT B (KITS) ×2 IMPLANT
NS IRRIG 1000ML POUR BTL (IV SOLUTION) ×2 IMPLANT
PACK GENERAL/GYN (CUSTOM PROCEDURE TRAY) ×2 IMPLANT
PACK UNIVERSAL I (CUSTOM PROCEDURE TRAY) ×2 IMPLANT
PAD ARMBOARD 7.5X6 YLW CONV (MISCELLANEOUS) ×4 IMPLANT
STAPLER VISISTAT 35W (STAPLE) IMPLANT
SUT ETHILON 3 0 PS 1 (SUTURE) IMPLANT
SUT VIC AB 2-0 CTX 36 (SUTURE) IMPLANT
SUT VIC AB 2-0 SH 27 (SUTURE) ×1
SUT VIC AB 2-0 SH 27XBRD (SUTURE) ×1 IMPLANT
SUT VIC AB 3-0 SH 27 (SUTURE) ×1
SUT VIC AB 3-0 SH 27X BRD (SUTURE) ×1 IMPLANT
SUT VIC AB 4-0 PS2 18 (SUTURE) ×2 IMPLANT
SUT VICRYL 4-0 PS2 18IN ABS (SUTURE) IMPLANT
TAPE CLOTH SURG 6X10 WHT LF (GAUZE/BANDAGES/DRESSINGS) ×2 IMPLANT
TOWEL GREEN STERILE (TOWEL DISPOSABLE) ×2 IMPLANT
WATER STERILE IRR 1000ML POUR (IV SOLUTION) ×2 IMPLANT

## 2019-10-03 NOTE — Transfer of Care (Signed)
Immediate Anesthesia Transfer of Care Note  Patient: Angela [name]  Procedure(s) Performed: EVACUATION OF LEFT LEG HEMATOMA (Left )  Patient Location: PACU  Anesthesia Type:General  Level of Consciousness: awake and drowsy  Airway & Oxygen Therapy: Patient Spontanous Breathing and Patient connected to face mask oxygen  Post-op Assessment: Report given to RN and Post -op Vital signs reviewed and stable  Post vital signs: Reviewed and stable  Last Vitals:  Vitals Value Taken Time  BP 113/65 10/03/19 1250  Temp    Pulse 66 10/03/19 1253  Resp 21 10/03/19 1254  SpO2 87 % 10/03/19 1253  Vitals shown include unvalidated device data.  Last Pain:  Vitals:   10/03/19 0705  TempSrc:   PainSc: 0-No pain         Complications: No apparent anesthesia complications

## 2019-10-03 NOTE — Anesthesia Procedure Notes (Signed)
Procedure Name: Intubation Date/Time: 10/03/2019 11:42 AM Performed by: Trinna Post., CRNA Pre-anesthesia Checklist: Patient identified, Emergency Drugs available, Suction available, Patient being monitored and Timeout performed Patient Re-evaluated:Patient Re-evaluated prior to induction Oxygen Delivery Method: Circle system utilized Preoxygenation: Pre-oxygenation with 100% oxygen Induction Type: IV induction and Rapid sequence Laryngoscope Size: Mac and 3 Grade View: Grade I Tube type: Oral Tube size: 7.0 mm Number of attempts: 1 Airway Equipment and Method: Stylet Placement Confirmation: ETT inserted through vocal cords under direct vision,  positive ETCO2 and breath sounds checked- equal and bilateral Secured at: 23 cm Tube secured with: Tape Dental Injury: Teeth and Oropharynx as per pre-operative assessment

## 2019-10-03 NOTE — Progress Notes (Signed)
Pt. To room 4E18 from PACU. VSS. On monitor CCMD notified. Alert and oriented to room and call light. Will continue to monitor.  Paulene Floor, RN

## 2019-10-03 NOTE — Progress Notes (Signed)
[facility] [facility]  Subjective  - left leg still hurts, no worse than yesterday   Objective 110/74 83 97.6 F (36.4 C) (!) 21 100%  Intake/Output Summary (Last 24 hours) at 10/03/2019 0849 Last data filed at 10/03/2019 0356 Gross per 24 hour  Intake 1864.35 ml  Output 1487 ml  Net 377.35 ml   Left leg 2+ DP pulse Compartments not tense but leg is swollen 20% more than right  Assessment/Planning: Hematoma left leg with discomfort 3 gm drop in hemoglobin over last 48 hr We need to restart her heparin so will evacuate hematoma today for pain control and to get hemostasis for anticoagulation   Ruta Hinds 10/03/2019 8:49 AM --  Laboratory Lab Results: Recent Labs    10/02/19 1154 10/03/19 0258  WBC 12.0* 9.6  HGB 11.1* 9.9*  HCT 35.7* 31.0*  PLT 159 155   BMET Recent Labs    10/02/19 0116 10/03/19 0258  NA 140 138  K 4.4 4.1  CL 107 106  CO2 21* 20*  GLUCOSE 147* 114*  BUN 18 19  CREATININE 0.85 0.96  CALCIUM 9.0 8.3*    COAG Lab Results  Component Value Date   INR 1.8 (H) 10/01/2019   INR 1.2 09/29/2019   INR 2.7 (H) 01/23/2019   No results found for: PTT

## 2019-10-03 NOTE — Op Note (Signed)
Procedure: Evacuation hematoma left leg  Preoperative diagnosis: Hematoma left leg  Postoperative diagnosis: Same  Anesthesia: General  Assistant: Nurse  Operative findings: Hematoma left below-knee popliteal space approximately 5 cc of coagulated blood some active bleeding from surrounding muscle tissue no anastomotic bleeding  Operative details: After pain informed consent, patient taken the operating.  Patient placed supine position operating table.  After induction general anesthesia endotracheal ovation, patient's entire left lower extremities prepped and draped in usual sterile fashion.  Pre-existing longitudinal incision on the medial aspect left leg was reopened carried through the subcutaneous tissues down to the below-knee popliteal space.  There was a large hematoma in this location about 100 cc of coagulated blood.  All this was removed and the area thoroughly irrigated with normal saline solution.  There were some scattered areas of muscle oozing and bleeding but no bleeding from the actual anastomosis from previous vein patch.  At this point hemostasis was obtained with direct pressure.  The deep layers of the muscle were closed with running 2-0 Vicryl suture.  A 10 flat Jackson-Pratt drain was brought out through separate stab incision was used to the skin and placed in the below-knee popliteal space below this to both layer. Was closed with running 3-0 Vicryl suture and 4-0 Vicryl subcuticular stitch in the skin with Dermabond applied to skin.  Patient tired procedure well there were no complications.  And sponge needle counts correct in the case.  Patient in recovery in stable condition.  Ruta Hinds, MD [facility] Office: [phone]

## 2019-10-03 NOTE — Progress Notes (Signed)
ANTICOAGULATION CONSULT NOTE   Pharmacy Consult for heparin Indication: L-leg embolus/AFib  No Known Allergies  Patient Measurements: Height: 5\' 6"  (167.6 cm) Weight: 133 lb 6.1 oz (60.5 kg) IBW/kg (Calculated) : 59.3 Heparin Dosing Weight: 60kg  Vital Signs: Temp: 97.6 F (36.4 C) (02/07 0705) Temp Source: Oral (02/07 0400) BP: 110/74 (02/07 0811) Pulse Rate: 83 (02/07 0811)  Labs: Recent Labs    10/01/19 1533 10/01/19 1533 10/02/19 0116 10/02/19 0116 10/02/19 0636 10/02/19 0636 10/02/19 1154 10/03/19 0258  HGB 13.9   < > 12.8   < > 11.7*   < > 11.1* 9.9*  HCT 45.4   < > 40.3   < > 37.3  --  35.7* 31.0*  PLT 185   < > 162   < > 168  --  159 155  LABPROT 21.0*  --   --   --   --   --   --   --   INR 1.8*  --   --   --   --   --   --   --   HEPARINUNFRC  --   --  1.08*  --   --   --   --   --   CREATININE 0.90  --  0.85  --   --   --   --  0.96   < > = values in this interval not displayed.    Estimated Creatinine Clearance: 47.4 mL/min (by C-G formula based on SCr of 0.96 mg/dL).   Assessment: 17 YOF presenting with acute leg pain, with left leg embolus and s/p embolectomy 2/5 pm.  On warfarin PTA for AFib, INR subtherapeutic at 1.8 on admission.  CBC wnl.    Pt now with hematoma s/p embolectomy. Plan is for patient to return to OR today for evacuation of hematoma. CBC shows Hg drop from 11.7 to 9.9 Plts are wnls.  Goal of Therapy:  Monitor platelets by anticoagulation protocol: Yes   Plan:  F/u heparin restart F/u further bleeding Daily CBC  Sherren Kerns, PharmD PGY1 [place] Resident Please see amion for complete clinical pharmacist phone list 10/03/2019 9:01 AM

## 2019-10-03 NOTE — OR Nursing (Signed)
Patient's upper and lower denture taken and placed in denture cup, labeled with patient ID and given to son-Jeff Till by Elenor Legato RN.

## 2019-10-03 NOTE — Progress Notes (Signed)
Pt w/ only trickles of urine throughout the night, states need to urinate but unable to fully void. 520 ml when bladder scanned. Pt straight cathed x1 per post-foley removal protocol fpr 750 mL yellow urine. Peri care done afterward. Pt endorses relief. Will continue to monitor.

## 2019-10-03 NOTE — Anesthesia Postprocedure Evaluation (Signed)
Anesthesia Post Note  Patient: Angela [name]  Procedure(s) Performed: EVACUATION OF HEMATOMA  LEFT LEG (Left )     Patient location during evaluation: PACU Anesthesia Type: General Level of consciousness: awake Pain management: pain level controlled Vital Signs Assessment: post-procedure vital signs reviewed and stable Respiratory status: spontaneous breathing Cardiovascular status: stable Postop Assessment: no apparent nausea or vomiting Anesthetic complications: no    Last Vitals:  Vitals:   10/03/19 1320 10/03/19 1335  BP: 102/64 99/66  Pulse: 70 75  Resp: 15 17  Temp:    SpO2: 99% 100%    Last Pain:  Vitals:   10/03/19 1320  TempSrc:   PainSc: 0-No pain                 Konya Fauble

## 2019-10-03 NOTE — Anesthesia Preprocedure Evaluation (Signed)
Anesthesia Evaluation  Patient identified by MRN, date of birth, ID band Patient awake    Airway Mallampati: II  TM Distance: >3 FB     Dental   Pulmonary    breath sounds clear to auscultation       Cardiovascular + Peripheral Vascular Disease and +CHF   Rhythm:Regular Rate:Normal     Neuro/Psych    GI/Hepatic negative GI ROS, Neg liver ROS,   Endo/Other    Renal/GU negative Renal ROS     Musculoskeletal   Abdominal   Peds  Hematology   Anesthesia Other Findings   Reproductive/Obstetrics                             Anesthesia Physical Anesthesia Plan  ASA: III  Anesthesia Plan: MAC and General   Post-op Pain Management:    Induction: Intravenous  PONV Risk Score and Plan: 3 and Ondansetron  Airway Management Planned: Oral ETT  Additional Equipment:   Intra-op Plan:   Post-operative Plan: Possible Post-op intubation/ventilation  Informed Consent: I have reviewed the patients History and Physical, chart, labs and discussed the procedure including the risks, benefits and alternatives for the proposed anesthesia with the patient or authorized representative who has indicated his/her understanding and acceptance.     Dental advisory given  Plan Discussed with: CRNA and Anesthesiologist  Anesthesia Plan Comments:         Anesthesia Quick Evaluation

## 2019-10-03 NOTE — Progress Notes (Signed)
ANTICOAGULATION CONSULT NOTE   Pharmacy Consult for heparin Indication: L-leg embolus/AFib  No Known Allergies  Patient Measurements: Height: 5\' 6"  (167.6 cm) Weight: 133 lb 6.1 oz (60.5 kg) IBW/kg (Calculated) : 59.3 Heparin Dosing Weight: 60kg  Vital Signs: Temp: 97 F (36.1 C) (02/07 1250) Temp Source: Oral (02/07 0400) BP: 99/66 (02/07 1335) Pulse Rate: 75 (02/07 1335)  Labs: Recent Labs    10/01/19 1533 10/01/19 1533 10/02/19 0116 10/02/19 0116 10/02/19 0636 10/02/19 0636 10/02/19 1154 10/03/19 0258  HGB 13.9   < > 12.8   < > 11.7*   < > 11.1* 9.9*  HCT 45.4   < > 40.3   < > 37.3  --  35.7* 31.0*  PLT 185   < > 162   < > 168  --  159 155  LABPROT 21.0*  --   --   --   --   --   --   --   INR 1.8*  --   --   --   --   --   --   --   HEPARINUNFRC  --   --  1.08*  --   --   --   --   --   CREATININE 0.90  --  0.85  --   --   --   --  0.96   < > = values in this interval not displayed.    Estimated Creatinine Clearance: 47.4 mL/min (by C-G formula based on SCr of 0.96 mg/dL).   Assessment: 3 YOF presenting with acute leg pain, with left leg embolus and s/p embolectomy 2/5 pm.  On warfarin PTA for AFib, INR subtherapeutic at 1.8 on admission. Heparin has been stopped since 2/6 at 0212.  Pt now with hematoma s/p embolectomy. Patient returned to OR today for evacuation of hematoma. Heparin to restart at 1900. CBC shows Hg drop from 11.7 to 9.9 Plts are wnls.  Goal of Therapy:  Monitor platelets by anticoagulation protocol: Yes  Heparin level goal 0.3-0.7 but aim for lower end due to recent bleeding   Plan:  No Bolus due to recent hematoma of left leg.  Start heparin conservatively at 700 units/hr on 2/7 @ 1900 Check 8 hour heparin level F/u further bleeding Daily CBC and heparin level  Sherren Kerns, PharmD PGY1 [place] Resident Please see amion for complete clinical pharmacist phone list 10/03/2019 1:47 PM

## 2019-10-03 NOTE — Progress Notes (Signed)
Bladder scan 618. Notified Dr. Oneida Alar. Instructed to insert foley.  Paulene Floor, RN

## 2019-10-04 LAB — HEPARIN LEVEL (UNFRACTIONATED)
Heparin Unfractionated: 0.2 IU/mL — ABNORMAL LOW (ref 0.30–0.70)
Heparin Unfractionated: 0.42 IU/mL (ref 0.30–0.70)

## 2019-10-04 LAB — BASIC METABOLIC PANEL
Anion gap: 18 — ABNORMAL HIGH (ref 5–15)
BUN: 18 mg/dL (ref 8–23)
CO2: 21 mmol/L — ABNORMAL LOW (ref 22–32)
Calcium: 8.2 mg/dL — ABNORMAL LOW (ref 8.9–10.3)
Chloride: 102 mmol/L (ref 98–111)
Creatinine, Ser: 0.89 mg/dL (ref 0.44–1.00)
GFR calc Af Amer: >60 mL/min (ref >60)
GFR calc non Af Amer: >60 mL/min (ref >60)
Glucose, Bld: 100 mg/dL — ABNORMAL HIGH (ref 70–99)
Potassium: 4.1 mmol/L (ref 3.5–5.1)
Sodium: 141 mmol/L (ref 135–145)

## 2019-10-04 LAB — CBC
HCT: 26.8 % — ABNORMAL LOW (ref 36.0–46.0)
Hemoglobin: 8.6 g/dL — ABNORMAL LOW (ref 12.0–15.0)
MCH: 30.2 pg (ref 26.0–34.0)
MCHC: 32.1 g/dL (ref 30.0–36.0)
MCV: 94 fL (ref 80.0–100.0)
Platelets: 126 10*3/uL — ABNORMAL LOW (ref 150–400)
RBC: 2.85 MIL/uL — ABNORMAL LOW (ref 3.87–5.11)
RDW: 15.9 % — ABNORMAL HIGH (ref 11.5–15.5)
WBC: 7.7 10*3/uL (ref 4.0–10.5)
nRBC: 0 % (ref 0.0–0.2)

## 2019-10-04 LAB — SURGICAL PATHOLOGY

## 2019-10-04 NOTE — Plan of Care (Signed)
  Problem: Activity: Goal: Ability to tolerate increased activity will improve Outcome: Progressing   

## 2019-10-04 NOTE — Progress Notes (Addendum)
  Progress Note    10/04/2019 7:39 AM 1 Day Post-Op  Subjective:  Pain L popliteal incision   Vitals:   10/04/19 0406 10/04/19 0600  BP: 116/64 (!) 99/59  Pulse: 73 72  Resp: 15 16  Temp: 98.3 F (36.8 C)   SpO2: 99% 96%   Physical Exam: Lungs:  Non labored Incisions:  L popliteal incision c/d/i Extremities:  Palpable L DP pulse; minimal JP output Neurologic: A&O  CBC    Component Value Date/Time   WBC 7.7 10/04/2019 0627   RBC 2.85 (L) 10/04/2019 0627   HGB 8.6 (L) 10/04/2019 0627   HGB 14.1 09/29/2008 1038   HCT 26.8 (L) 10/04/2019 0627   HCT 41.7 09/29/2008 1038   PLT 126 (L) 10/04/2019 0627   PLT 134 (L) 09/29/2008 1038   MCV 94.0 10/04/2019 0627   MCV 89.9 09/29/2008 1038   MCH 30.2 10/04/2019 0627   MCHC 32.1 10/04/2019 0627   RDW 15.9 (H) 10/04/2019 0627   RDW 16.2 (H) 09/29/2008 1038   LYMPHSABS 1.3 10/01/2019 1533   LYMPHSABS 0.8 (L) 09/29/2008 1038   MONOABS 0.5 10/01/2019 1533   MONOABS 0.3 09/29/2008 1038   EOSABS 0.1 10/01/2019 1533   EOSABS 0.0 09/29/2008 1038   BASOSABS 0.0 10/01/2019 1533   BASOSABS 0.0 09/29/2008 1038    BMET    Component Value Date/Time   NA 141 10/04/2019 0328   K 4.1 10/04/2019 0328   CL 102 10/04/2019 0328   CO2 21 (L) 10/04/2019 0328   GLUCOSE 100 (H) 10/04/2019 0328   BUN 18 10/04/2019 0328   CREATININE 0.89 10/04/2019 0328   CALCIUM 8.2 (L) 10/04/2019 0328   GFRNONAA >60 10/04/2019 0328   GFRAA >60 10/04/2019 0328    INR    Component Value Date/Time   INR 1.8 (H) 10/01/2019 1533     Intake/Output Summary (Last 24 hours) at 10/04/2019 0739 Last data filed at 10/04/2019 0700 Gross per 24 hour  Intake 2081.92 ml  Output 1662 ml  Net 419.92 ml     Assessment/Plan:  74 y.o. female is s/p thrombectomy LLE with subsequent evacuation of L calf hematoma 1 Day Post-Op   Perfusing L foot well with palpable DP pulse OOB today D/c JP if no output after ambulating and working with therapy teams  today Continue IV heparin; will discuss coumadin restart with Dr. Oneida Alar Case manager working on SNF placement    Dagoberto Ligas, PA-C [facility] [phone] 10/04/2019 7:39 AM  Agree with above. D/c JP in the morning if low output (<30 ml) Resume warfarin tomorrow without loading dose if no further bleeding   Ruta Hinds, MD [facility] [place] Office: [phone]

## 2019-10-04 NOTE — Progress Notes (Signed)
ANTICOAGULATION CONSULT NOTE   Pharmacy Consult for heparin Indication: L-leg embolus/AFib  No Known Allergies  Patient Measurements: Height: 5\' 6"  (167.6 cm) Weight: 134 lb 0.6 oz (60.8 kg) IBW/kg (Calculated) : 59.3 Heparin Dosing Weight: 60kg  Vital Signs: Temp: 98 F (36.7 C) (02/08 1211) Temp Source: Oral (02/08 1211) BP: 101/64 (02/08 1211) Pulse Rate: 75 (02/08 1211)  Labs: Recent Labs    10/01/19 1533 10/01/19 1533 10/02/19 0116 10/02/19 0636 10/02/19 1154 10/02/19 1154 10/03/19 0258 10/04/19 0328 10/04/19 0627 10/04/19 1202  HGB 13.9   < > 12.8   < > 11.1*   < > 9.9*  --  8.6*  --   HCT 45.4   < > 40.3   < > 35.7*  --  31.0*  --  26.8*  --   PLT 185   < > 162   < > 159  --  155  --  126*  --   LABPROT 21.0*  --   --   --   --   --   --   --   --   --   INR 1.8*  --   --   --   --   --   --   --   --   --   HEPARINUNFRC  --   --  1.08*  --   --   --   --  0.20*  --  0.42  CREATININE 0.90   < > 0.85  --   --   --  0.96 0.89  --   --    < > = values in this interval not displayed.    Estimated Creatinine Clearance: 51.1 mL/min (by C-G formula based on SCr of 0.89 mg/dL).  Assessment: 40 YOF presenting with acute leg pain, with left leg embolus and s/p embolectomy 2/5 pm.  On warfarin PTA for AFib, INR subtherapeutic at 1.8 on admission.   Pt now with hematoma s/p embolectomy. Patient returned to OR 2/7 for evacuation of hematoma. Heparin restarted post-OR. Noted plans to restart warfarin on 2/9 -heparin level at goal  Goal of Therapy:  Monitor platelets by anticoagulation protocol: Yes  Heparin level goal 0.3-0.7 but aim for lower end due to recent bleeding   Plan:  -No heparin changes needed -Daily heparin level and CBC  Hildred Laser, PharmD Clinical Pharmacist **Pharmacist phone directory can now be found on [email or web address] ([email or web address]).  Listed under [place].

## 2019-10-04 NOTE — Progress Notes (Signed)
Occupational Therapy Treatment Patient Details Name: Angela [name] MRN: [id number] DOB: 11-Jul-1946 Today's Date: 10/04/2019    History of present illness 74 year old female with a history of A. fib (on Coumadin), DVT, nonischemic cardiomyopathy, rheumatic heart disease, sinus sick syndrome, CHF, who presented to the emergency department complaining of left lower extremity pain. This is been present for the last several days. She was seen in the ED a few days ago for chest pain. He also notes that she was complaining that her left leg was cold. Pt found to have thrombus left popliteal artery. She underwent left popliteal and tibial embolectomy 10/01/19.   OT comments  Pt making steady progress towards OT goals this session. Session focus on functional mobility as precursor to higher level ADLs. Overall, pt requires MINA  +2 for bed mobility and MOD A +2 to take steps to recliner with RW. Pt with heavy posterior lean in standing needing tactile cues to shift weight anteriorly. Pt not wanting to WB thru LLE needing physical assist to shift weight to L. Pt required step by step cues to sequence steps to recliner and MOD A+2 to balance. Pts son present stating that he plans to take pt home for DC. Feel SNF is a  more appropriate DC plan based on today's session however pts son states they have the family support to assist pt at home. If pt decided to return home feel pt would need max DME listed below. Will continue to follow acutely for OT needs.    Follow Up Recommendations  SNF;Supervision/Assistance - 24 hour;Other (comment)(max HH services if declines SNF (OT, PT, aide))    Equipment Recommendations  3 in 1 bedside commode    Recommendations for Other Services      Precautions / Restrictions Precautions Precautions: Fall;Other (comment) Precaution Comments: drain L distal LE Restrictions Weight Bearing Restrictions: No       Mobility Bed Mobility Overal bed mobility: Needs Assistance Bed  Mobility: Supine to Sit     Supine to sit: Min assist;+2 for physical assistance     General bed mobility comments: pt able to advance BLEs to EOB, MIN cues for hand placement and MIN A to elevate trunk and scoot hips to EOB  Transfers Overall transfer level: Needs assistance Equipment used: Rolling walker (2 wheeled) Transfers: Sit to/from [facility] Sit to Stand: Mod assist;+2 physical assistance Stand pivot transfers: +2 safety/equipment;Min assist;Mod assist;+2 physical assistance       General transfer comment: pt required cues for hand placement and MOD A +2 to power into standing as pt with heavy posterior lean. Pt able to take steps to recliner this session but requires MOD A for balance and MAX cues to sequence steps.Pt not wanting to WB thru LLE. cues for safety awareness as pt attempting to sit into recliner prematurely needing assist to guide hips into chair    Balance Overall balance assessment: Needs assistance Sitting-balance support: No upper extremity supported;Feet supported Sitting balance-Leahy Scale: Fair Sitting balance - Comments: close min guard Postural control: Posterior lean Standing balance support: Bilateral upper extremity supported;During functional activity Standing balance-Leahy Scale: Poor Standing balance comment: reliant on external support                           ADL either performed or assessed with clinical judgement   ADL Overall ADL's : Needs assistance/impaired  Lower Body Dressing: Total assistance;Bed level Lower Body Dressing Details (indicate cue type and reason): total A to don socks from bed level Toilet Transfer: Minimal assistance;Moderate assistance;+2 for physical assistance;Ambulation;RW Toilet Transfer Details (indicate cue type and reason): simulated to recliner; MIN A to power into standing with heavy posterior lean; MAX cues to sequence steps to recliner; pt not  wanting to shift weight to L         Functional mobility during ADLs: Minimal assistance;Moderate assistance;+2 for physical assistance;+2 for safety/equipment;Cueing for sequencing;Rolling walker General ADL Comments: session focus on functional mobility from EOB>recliner. Pt limited by impaired balance, generalized weakness and cognition     Vision       Perception     Praxis      Cognition Arousal/Alertness: Awake/alert Behavior During Therapy: WFL for tasks assessed/performed;Anxious Overall Cognitive Status: Impaired/Different from baseline Area of Impairment: Following commands;Safety/judgement;Awareness;Problem solving;Attention                   Current Attention Level: Focused   Following Commands: Follows one step commands inconsistently;Follows one step commands with increased time Safety/Judgement: Decreased awareness of deficits Awareness: Intellectual Problem Solving: Slow processing;Decreased initiation;Difficulty sequencing;Requires verbal cues;Requires tactile cues General Comments: pt with slow initiation needing max multimodal cues to sequence steps to recliner. benefits from short commands with tactile cues.        Exercises     Shoulder Instructions       General Comments VSS; BP 100/ 85 sitting EOB. LLE dressing clean and dry; LLE drain secure and draining minimally    Pertinent Vitals/ Pain       Pain Assessment: 0-10 Pain Score: 10-Worst pain ever Pain Location: LLE with mobility Pain Descriptors / Indicators: Grimacing;Guarding;Discomfort Pain Intervention(s): Limited activity within patient's tolerance;Monitored during session;Repositioned  Home Living                                          Prior Functioning/Environment              Frequency  Min 2X/week        Progress Toward Goals  OT Goals(current goals can now be found in the care plan section)  Progress towards OT goals: Progressing toward  goals  Acute Rehab OT Goals Patient Stated Goal: home OT Goal Formulation: With patient Time For Goal Achievement: 10/16/19 Potential to Achieve Goals: Good  Plan Discharge plan remains appropriate    Co-evaluation                 AM-PAC OT "6 Clicks" Daily Activity     Outcome Measure   Help from another person eating meals?: None Help from another person taking care of personal grooming?: A Little Help from another person toileting, which includes using toliet, bedpan, or urinal?: A Lot Help from another person bathing (including washing, rinsing, drying)?: A Lot Help from another person to put on and taking off regular upper body clothing?: A Little Help from another person to put on and taking off regular lower body clothing?: Total 6 Click Score: 15    End of Session Equipment Utilized During Treatment: Gait belt;Rolling walker  OT Visit Diagnosis: Other abnormalities of gait and mobility (R26.89);Muscle weakness (generalized) (M62.81);Pain;Other symptoms and signs involving cognitive function Pain - Right/Left: Left Pain - part of body: Leg   Activity Tolerance Patient tolerated treatment well   Patient Left  in chair;with call bell/phone within reach;with chair alarm set   Nurse Communication Mobility status        Time: [id number] OT Time Calculation (min): 28 min  Charges: OT General Charges $OT Visit: 1 Visit OT Treatments $Therapeutic Activity: 23-37 mins Lanier Clam., COTA/L Acute Rehabilitation Services [phone] [place] 10/04/2019, 4:07 PM

## 2019-10-04 NOTE — Progress Notes (Deleted)
Cardiology Office Note Date:  10/04/2019  Patient ID:  Angela [name], Angela [name] [birth year], MRN [id number] PCP:  Lorene Dy, MD  Electrophysiologist:  Dr. Lovena Le   Chief Complaint: ***  History of Present Illness: Angela [name] is a 74 y.o. 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  June 2018 she was noted to have nonsustained PMVT known though no treated events.  Had recently had LGIB in environment of INR 4.3, required transfusion and warfarin reversal.  Most recently saw Dr. Lovena Le July 2020,  She had recently been admitted to [facility] May 2020 with AMS, UTI, and here had VT storm, again in the environment of infection, and outpt had stopped her amiodarone and coreg April 2020  while at her surgeons office, with observed ICD shocks, at that time being treated as well forperirectal infection, CT noted showed multiple foci of gas concerning for possible Fournier's gangrene. She had at that time appropriate shocks for PMVT start on amio   Had an ER visit with LLE pain and CP, and ICD shock. Apparently had cardiac evaluation and discharged (I can not find details of this visit)  Admitted to [facility] 10/01/2019 with LLE ischemia in the environment of subtherapeutic INR underwent Left popliteal and tibial embolectomy same day, complicated by LE hematoma requiring evacuation 10/03/2018 ***   *** symptoms *** shocks, VT *** bleeding, warfarin, who manages? *** meds, CM *** amio labs *** volume *** mechanical MVR, echo 10/2018 ok   Device information: SJM dual chamber ICD, implanted 2009, gen change 09/05/14, Dr. Lovena Le Patient has an abandoned pacing system by CXR + hx of appropriate therapy AAD Amiodarone started April 2020, is current Past Medical History:  Diagnosis Date  . Abnormality of gait 08/23/2014  . Arthritis   . Atrial fibrillation ([place])    A.   Chronic Coumadin  . Cardiac arrest - ventricular fibrillation    A.  12/2007;  B. 01/2008 St. Jude Promote Bi-V ICD placed  . CVA (cerebral vascular accident) ([place])   . Diverticular disease   . DVT (deep venous thrombosis) ([place])   . Embolus and thrombosis of iliac artery ([place])    A.  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 History of colon cancer    A.  1999 - T3, N1  chemotherapy  . Internal hemorrhoids   . Memory deficit 08/23/2014  . Nonischemic cardiomyopathy ([place])    a.  11/2001 - Cath - NL Cors; b. ECHO 09/22/11: EF 20%, MVR normal, moderate LAE, mild RAE c. ECHO (08/2012): ED 20%, diff HK, LA mod dilated, mild/mod TR, RV mild/mod decreased sys fx  . Rectal bleeding 04/28/2017  . Rheumatic heart disease    A. 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 Sick sinus syndrome ([place])    A.  s/p pacer in 1995.  B.    . Systolic CHF, chronic ([place])    A.  01/2008 Echo - EF 10-20%  . UTI (urinary tract infection) 01/19/2019    Past Surgical History:  Procedure Laterality Date  . ABDOMINAL HYSTERECTOMY    . AV NODE ABLATION N/A 09/23/2011   Procedure: AV NODE ABLATION;  Surgeon: Evans Lance, MD;  Location: [facility] CATH LAB;  Service: Cardiovascular;  Laterality: N/A;  . BIV ICD GENERTAOR CHANGE OUT N/A 09/05/2014   Procedure: BIV ICD GENERTAOR CHANGE OUT;  Surgeon: Evans Lance, MD;  Location: [facility]  CATH LAB;  Service: Cardiovascular;  Laterality: N/A;  . Biventricular AICD    . COLON SURGERY  1999  . EMBOLECTOMY Left 10/01/2019   Procedure: Left Popliteal Artery Thrombectomy with Vein Patch Angioplasty; Left Tibial Artery Thrombectomy;  Surgeon: Elam Dutch, MD;  Location: [facility] OR;  Service: Vascular;  Laterality: Left;  . History of echocardiogram  2003, 2007, 2009  . I & D EXTREMITY Left 10/03/2019   Procedure: EVACUATION OF HEMATOMA  LEFT LEG;  Surgeon: Elam Dutch, MD;  Location: [place];  Service: Vascular;  Laterality: Left;  . INCISION AND DRAINAGE PERIRECTAL ABSCESS Right 11/28/2018     Procedure: IRRIGATION AND DEBRIDEMENT PERIRECTAL ABSCESS;  Surgeon: Greer Pickerel, MD;  Location: [place];  Service: General;  Laterality: Right;  . Mitral Valve Replacement, Bjork-Shiley valve  1983    No current facility-administered medications for this visit.   No current outpatient medications on file.   Facility-Administered Medications Ordered in Other Visits  Medication Dose Route Frequency Provider Last Rate Last Admin  . 0.9 %  sodium chloride infusion  500 mL Intravenous Once PRN Elam Dutch, MD      . 0.9 %  sodium chloride infusion   Intravenous Continuous Elam Dutch, MD 50 mL/hr at 10/04/19 1037 New Bag at 10/04/19 1037  . acetaminophen (TYLENOL) tablet 325-650 mg  325-650 mg Oral Q4H PRN Elam Dutch, MD       Or  . acetaminophen (TYLENOL) suppository 325-650 mg  325-650 mg Rectal Q4H PRN Elam Dutch, MD      . alum & mag hydroxide-simeth (MAALOX/MYLANTA) 200-200-20 MG/5ML suspension 15-30 mL  15-30 mL Oral Q2H PRN Elam Dutch, MD      . bisacodyl (DULCOLAX) EC tablet 5 mg  5 mg Oral Daily PRN Elam Dutch, MD      . carvedilol (COREG) tablet 3.125 mg  3.125 mg Oral BID WC Elam Dutch, MD   3.125 mg at 10/04/19 [id number]  . Chlorhexidine Gluconate Cloth 2 % PADS 6 each  6 each Topical Daily Elam Dutch, MD   6 each at 10/03/19 [phone]  . digoxin (LANOXIN) tablet 125 mcg  125 mcg Oral Daily Elam Dutch, MD   125 mcg at 10/04/19 1038  . docusate sodium (COLACE) capsule 100 mg  100 mg Oral Daily Elam Dutch, MD   100 mg at 10/04/19 1038  . furosemide (LASIX) tablet 20 mg  20 mg Oral Daily Elam Dutch, MD   20 mg at 10/04/19 1038  . guaiFENesin-dextromethorphan (ROBITUSSIN DM) 100-10 MG/5ML syrup 15 mL  15 mL Oral Q4H PRN Elam Dutch, MD      . heparin ADULT infusion 100 units/mL (25000 units/220mL sodium chloride 0.45%)  800 Units/hr Intravenous Continuous Franky Macho, RPH 8 mL/hr at 10/04/19 0435 800 Units/hr at  10/04/19 0435  . hydrALAZINE (APRESOLINE) injection 5 mg  5 mg Intravenous Q20 Min PRN Elam Dutch, MD      . HYDROmorphone (DILAUDID) injection 0.5-1 mg  0.5-1 mg Intravenous Q2H PRN Elam Dutch, MD   0.5 mg at 10/03/19 1415  . labetalol (NORMODYNE) injection 10 mg  10 mg Intravenous Q10 min PRN Elam Dutch, MD      . magnesium sulfate IVPB 2 g 50 mL  2 g Intravenous Daily PRN Elam Dutch, MD      . metoprolol tartrate (LOPRESSOR) injection 2-5 mg  2-5 mg Intravenous Q2H PRN Fields,  Jessy Oto, MD      . midodrine (PROAMATINE) tablet 10 mg  10 mg Oral BID WC Elam Dutch, MD   10 mg at 10/04/19 0800  . ondansetron (ZOFRAN) injection 4 mg  4 mg Intravenous Q6H PRN Elam Dutch, MD      . oxyCODONE (Oxy IR/ROXICODONE) immediate release tablet 5-10 mg  5-10 mg Oral Q4H PRN Elam Dutch, MD      . pantoprazole (PROTONIX) EC tablet 40 mg  40 mg Oral Daily Elam Dutch, MD   40 mg at 10/04/19 1038  . phenol (CHLORASEPTIC) mouth spray 1 spray  1 spray Mouth/Throat PRN Fields, Charles E, MD      . potassium chloride SA (KLOR-CON) CR tablet 20-40 mEq  20-40 mEq Oral Daily PRN Elam Dutch, MD      . senna-docusate (Senokot-S) tablet 1 tablet  1 tablet Oral QHS PRN Elam Dutch, MD      . tiZANidine (ZANAFLEX) tablet 4 mg  4 mg Oral Daily PRN Elam Dutch, MD        Allergies:   Patient has no known allergies.   Social History:  The patient  reports that she has never smoked. She has never used smokeless tobacco. She reports that she does not drink alcohol or use drugs.   Family History:  The patient's family history includes Coronary artery disease in her mother and sister; Diabetes (age of onset: 28) in her mother; Lung cancer (age of onset: 15) in her father.  ROS:  Please see the history of present illness.  All other systems are reviewed and otherwise negative.   PHYSICAL EXAM:  VS:  There were no vitals taken for this visit. BMI: There  is no height or weight on file to calculate BMI. Well nourished, well developed, in no acute distress  HEENT: normocephalic, atraumatic  Neck: no JVD, carotid bruits or masses Cardiac:  *** RRR (paced) w/extrasystoles appreciated; valve noted, no rubs, or gallops Lungs:  ***  CTA, no wheezing, rhonchi or rales  Abd: soft, nontender MS: no deformity, + age appropriate atrophy Ext: *** edema  Skin: warm and dry, no rash Neuro:  No gross deficits appreciated Psych: euthymic mood, full affect  ***ICD site is stable, no tethering or discomfort   EKG:   Not done today ICD interrogation done today and reviewed by myself:  ***   10/27/2018: TTE IMPRESSIONS  1. The left ventricle has a visually estimated ejection fraction of of  15-20%. The cavity size was severely dilated. Left ventricular diastolic  Doppler parameters are indeterminate due to mitral valve  replacement/repair. Left ventricular diffuse  hypokinesis.  2. The right ventricle has moderately reduced systolic function. The  cavity was normal. There is no increase in right ventricular wall  thickness.  3. Left atrial size was severely dilated.  4. Right atrial size was severely dilated.  5. A mechanical valve is present in the mitral position. Procedure Date:  1983.  6. Bjork-Shiley mechanical mitral valve prosthesis functioning properly.  7. The tricuspid valve is normal in structure.  8. The aortic valve is normal in structure.  9. The pulmonic valve was normal in structure. Pulmonic valve  regurgitation is mild by color flow Doppler.  10. The inferior vena cava was normal in size with <50% respiratory  variability.     09/14/12 TTE Study Conclusions - Left ventricle: The cavity size was moderately dilated. Wall thickness was normal. Indeterminant diastolic function. The  estimated ejection fraction was 20%. Diffuse hypokinesis. - Aortic valve: There was no stenosis. - Mitral valve: Mechanical mitral  valve. No evidence for significant prosthetic stenosis. Trivial regurgitation. Mean gradient: 68mm Hg (D). - Left atrium: The atrium was moderately dilated. - Right ventricle: The cavity size was normal. Pacer wire or catheter noted in right ventricle. Systolic function was mildly to moderately reduced. - Right atrium: The atrium was mildly dilated. - Atrial septum: The septum bowed from left to right, consistent with increased left atrial pressure. - Tricuspid valve: Mild-moderate regurgitation. Peak RV-RA gradient:52mm Hg (S). - Pulmonary arteries: PA peak pressure: 66mm Hg (S). - Inferior vena cava: The vessel was normal in size; the respirophasic diameter changes were in the normal range (= 50%); findings are consistent with normal central venous pressure. Impressions - Moderately dilated LV with severe global hypokinesis, EF 20%. Mechanical mitral valve appears to function normally. Normal RV size with mild to moderately decreased systolic function. Mild pulmonary hypertension.  Recent Labs: 01/19/2019: ALT 27 01/22/2019: TSH 2.338 09/29/2019: B Natriuretic Peptide 611.2; Magnesium 2.3 10/04/2019: BUN 18; Creatinine, Ser 0.89; Hemoglobin 8.6; Platelets 126; Potassium 4.1; Sodium 141  No results found for requested labs within last 8760 hours.   Estimated Creatinine Clearance: 51.1 mL/min (by C-G formula based on SCr of 0.89 mg/dL).   Wt Readings from Last 3 Encounters:  10/04/19 134 lb 0.6 oz (60.8 kg)  03/23/19 135 lb 8 oz (61.5 kg)  01/23/19 135 lb 2.3 oz (61.3 kg)     Other studies reviewed: Additional studies/records reviewed today include: summarized above  ASSESSMENT AND PLAN:  1. VT/ ICD    ***  NSVT and PVCs known for her    *** % BiVe pacing, is PVCs     Previously d/w Dr. Lovena Le, has failed amiodarone historically, he BP will not allow titration of BB  2. Paroxysmal AFib    ***  CHA2DS2Vasc is at least 5, on warfarin   3. Mechanical  MVR     ***Last echo 2014 with stable function     On warfarin     *** She has had a number of embolic events, as well as a bleeding event unfortunately  4. NICM    ***  5. HTN     ***     Disposition: ***     Current medicines are reviewed at length with the patient today.  The patient did not have any concerns regarding medicines.  Venetia Night, PA-C 10/04/2019 1:50 PM     [address] [place] [address] [phone] (office)  [phone] (fax)

## 2019-10-04 NOTE — Progress Notes (Signed)
ANTICOAGULATION CONSULT NOTE   Pharmacy Consult for heparin Indication: L-leg embolus/AFib  No Known Allergies  Patient Measurements: Height: 5\' 6"  (167.6 cm) Weight: 134 lb 0.6 oz (60.8 kg) IBW/kg (Calculated) : 59.3 Heparin Dosing Weight: 60kg  Vital Signs: Temp: 98.3 F (36.8 C) (02/08 0406) Temp Source: Oral (02/08 0406) BP: 116/64 (02/08 0406) Pulse Rate: 73 (02/08 0406)  Labs: Recent Labs    10/01/19 1533 10/01/19 1533 10/02/19 0116 10/02/19 0116 10/02/19 0636 10/02/19 0636 10/02/19 1154 10/03/19 0258 10/04/19 0328  HGB 13.9   < > 12.8   < > 11.7*   < > 11.1* 9.9*  --   HCT 45.4   < > 40.3   < > 37.3  --  35.7* 31.0*  --   PLT 185   < > 162   < > 168  --  159 155  --   LABPROT 21.0*  --   --   --   --   --   --   --   --   INR 1.8*  --   --   --   --   --   --   --   --   HEPARINUNFRC  --   --  1.08*  --   --   --   --   --  0.20*  CREATININE 0.90  --  0.85  --   --   --   --  0.96  --    < > = values in this interval not displayed.    Estimated Creatinine Clearance: 47.4 mL/min (by C-G formula based on SCr of 0.96 mg/dL).  Assessment: 35 YOF presenting with acute leg pain, with left leg embolus and s/p embolectomy 2/5 pm.  On warfarin PTA for AFib, INR subtherapeutic at 1.8 on admission. Heparin has been stopped since 2/6 at 0212.  Pt now with hematoma s/p embolectomy. Patient returned to OR 2/7 for evacuation of hematoma. Heparin restarted post-OR.   Heparin level subtherapeutic (0.2) on gtt at 700 units/hr. No issues with line or bleeding reported per RN.  Goal of Therapy:  Monitor platelets by anticoagulation protocol: Yes  Heparin level goal 0.3-0.7 but aim for lower end due to recent bleeding   Plan:  Increase heparin conservatively to 800 units/hr Check 8 hour heparin level F/u further bleeding  Sherlon Handing, PharmD, BCPS Please see amion for complete clinical pharmacist phone list 10/04/2019 4:31 AM

## 2019-10-05 LAB — HEPARIN LEVEL (UNFRACTIONATED)
Heparin Unfractionated: 0.11 IU/mL — ABNORMAL LOW (ref 0.30–0.70)
Heparin Unfractionated: 0.37 IU/mL (ref 0.30–0.70)
Heparin Unfractionated: 0.42 IU/mL (ref 0.30–0.70)

## 2019-10-05 LAB — PROTIME-INR
INR: 1.6 — ABNORMAL HIGH (ref 0.8–1.2)
Prothrombin Time: 18.8 seconds — ABNORMAL HIGH (ref 11.4–15.2)

## 2019-10-05 LAB — CBC
HCT: 26.3 % — ABNORMAL LOW (ref 36.0–46.0)
Hemoglobin: 8.1 g/dL — ABNORMAL LOW (ref 12.0–15.0)
MCH: 30.1 pg (ref 26.0–34.0)
MCHC: 30.8 g/dL (ref 30.0–36.0)
MCV: 97.8 fL (ref 80.0–100.0)
Platelets: 144 10*3/uL — ABNORMAL LOW (ref 150–400)
RBC: 2.69 MIL/uL — ABNORMAL LOW (ref 3.87–5.11)
RDW: 16.1 % — ABNORMAL HIGH (ref 11.5–15.5)
WBC: 5.7 10*3/uL (ref 4.0–10.5)
nRBC: 0 % (ref 0.0–0.2)

## 2019-10-05 MED ORDER — WARFARIN - PHYSICIAN DOSING INPATIENT: Freq: Every day | Status: DC

## 2019-10-05 MED ORDER — WARFARIN SODIUM 5 MG PO TABS
5.0000 mg | ORAL_TABLET | Freq: Every day | ORAL | Status: AC
  Administered 2019-10-05: 5 mg via ORAL
  Filled 2019-10-05: qty 1

## 2019-10-05 NOTE — TOC Initial Note (Signed)
Transition of Care (TOC) - Initial/Assessment Note  Marvetta Gibbons RN, BSN Transitions of Care Unit 4E- RN Case Manager [phone]   Patient Details  Name: Angela [name] MRN: [id number] Date of Birth: 1945-09-19  Transition of Care [facility]) CM/SW Contact:    Dawayne Patricia, RN Phone Number: 10/05/2019, 2:28 PM  Clinical Narrative:                 Pt admitted from home, lives with son/family s/p thrombectomy. PT/OT recommending SNF- however son declining and wants to take pt home with [facility]. CM spoke with son and pt at the bedside- discussed [place] and DME needs- list provided for [facility] choice Per CMS guidelines from medicare.gov website with star ratings (copy placed in shadow chart)- son would like time to review list and will call this CM when he has selected one- contact info provided. Per son pt has RW at home and only need 3n1 for transition home- will contact [employer] to have DME delivered to room prior to discharge. Per son he will plan to transport home himself via car. CM will await call from son for [facility] choice so referral can be made for PT/OT- son declines need for aide.   Expected Discharge Plan: Skilled Nursing Facility Barriers to Discharge: Continued Medical Work up   Patient Goals and CMS Choice Patient states their goals for this hospitalization and ongoing recovery are:: return home with family CMS Medicare.gov Compare Post Acute Care list provided to:: Patient Represenative (must comment)(son) Choice offered to / list presented to : Adult Children  Expected Discharge Plan and Services Expected Discharge Plan: [place]   Discharge Planning Services: CM Consult Post Acute Care Choice: Home Health, Durable Medical Equipment Living arrangements for the past 2 months: Single Family Home                 DME Arranged: 3-N-1 DME Agency: [facility]       HH Arranged: Refused SNF          Prior Living Arrangements/Services Living arrangements for the past 2  months: Single Family Home Lives with:: Adult Children Patient language and need for interpreter reviewed:: Yes Do you feel safe going back to the place where you live?: Yes      Need for Family Participation in Patient Care: Yes (Comment) Care giver support system in place?: Yes (comment) Current home services: DME Criminal Activity/Legal Involvement Pertinent to Current Situation/Hospitalization: No - Comment as needed  Activities of Daily Living Home Assistive Devices/Equipment: Eyeglasses, Cane (specify quad or straight), Bedside commode/3-in-1, Blood pressure cuff, Dentures (specify type), Wheelchair, Environmental consultant (specify type), Scales ADL Screening (condition at time of admission) Patient's cognitive ability adequate to safely complete daily activities?: No Is the patient deaf or have difficulty hearing?: Yes(hoh on lt side) Does the patient have difficulty seeing, even when wearing glasses/contacts?: No(reading glasses) Does the patient have difficulty concentrating, remembering, or making decisions?: Yes Patient able to express need for assistance with ADLs?: No Does the patient have difficulty dressing or bathing?: Yes Independently performs ADLs?: No Communication: Needs assistance Is this a change from baseline?: Pre-admission baseline Dressing (OT): Needs assistance Is this a change from baseline?: Pre-admission baseline Grooming: Needs assistance Is this a change from baseline?: Pre-admission baseline Feeding: Needs assistance Is this a change from baseline?: Pre-admission baseline Bathing: Needs assistance Is this a change from baseline?: Pre-admission baseline Toileting: Needs assistance Is this a change from baseline?: Pre-admission baseline In/Out Bed: Needs assistance Is this a change  from baseline?: Pre-admission baseline Walks in Home: Needs assistance Is this a change from baseline?: Pre-admission baseline Does the patient have difficulty walking or climbing  stairs?: Yes Weakness of Legs: Left Weakness of Arms/Hands: None  Permission Sought/Granted Permission sought to share information with : Investment banker, corporate granted to share info w AGENCY: HH agency        Emotional Assessment Appearance:: Appears stated age Attitude/Demeanor/Rapport: Engaged Affect (typically observed): Appropriate Orientation: : Oriented to Self, Oriented to Place, Oriented to  Time, Oriented to Situation   Psych Involvement: No (comment)  Admission diagnosis:  Arterial occlusion [I70.90] Sensation of cold in leg [R20.9] Femoral artery thrombosis, left ([place]) [I74.3] Patient Active Problem List   Diagnosis Date Noted  . Sensation of cold in leg 10/01/2019  . Femoral artery thrombosis, left ([place]) 10/01/2019  . Acute on chronic combined systolic and diastolic CHF (congestive heart failure) ([place]) 01/22/2019  . Perianal pain   . Acute metabolic encephalopathy [id number]  . UTI (urinary tract infection) 01/14/2019  . Protein-calorie malnutrition, severe 12/08/2018  . Goals of care, counseling/discussion   . 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 Syncope 11/26/2018  . Ventricular tachycardia ([place]) 11/26/2018  . Elevated INR 11/26/2018  . Acute on chronic systolic (congestive) heart failure ([place]) 10/27/2018  . Elevated troponin 10/26/2018  . Anemia 10/26/2018  . Palliative care by specialist   . Rectal bleeding 04/28/2017  . Acute blood loss anemia 04/28/2017  . ICD (implantable cardioverter-defibrillator), biventricular, in situ 08/24/2014  . Memory deficit 08/23/2014  . Abnormality of gait 08/23/2014  . Dementia ([place]) 12/17/2013  . 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 Hypotension due to drugs 07/12/2012  . Hypokalemia 07/12/2012  . NSVT (nonsustained ventricular tachycardia) ([place]) 07/12/2012  . Acute on chronic systolic heart failure ([place]) 07/07/2012  . Ventricular fibrillation ([place]) 11/11/2011  .  Warfarin anticoagulation 09/22/2011  . Nonischemic cardiomyopathy ([place])   . Atrial fibrillation ([place])   . History of colon cancer   . Internal hemorrhoids   . Rheumatic heart disease   . Sick sinus syndrome ([place])   . Systolic CHF, chronic ([place])   . DIVERTICULAR DISEASE 06/05/2009   PCP:  Lorene Dy, MD Pharmacy:   [facility] [place], [address] [place] [address] Phone: [phone] Fax: [phone]     Social Determinants of Health (SDOH) Interventions    Readmission Risk Interventions Readmission Risk Prevention Plan 01/19/2019 12/05/2018 12/05/2018  Transportation Screening Complete - Complete  PCP or Specialist Appt within 3-5 Days - - Complete  HRI or Home Care Consult Complete Complete (No Data)  Social Work Consult for [place] Planning/Counseling Complete Complete -  Palliative Care Screening Complete - Not Applicable  Medication Review Press photographer) Complete - Complete  Some recent data might be hidden

## 2019-10-05 NOTE — Progress Notes (Signed)
ANTICOAGULATION CONSULT NOTE   Pharmacy Consult for Heparin Indication: Left Leg Embolus/Atrial Fibrillation  No Known Allergies  Patient Measurements: Height: 5\' 6"  (167.6 cm) Weight: 134 lb 0.6 oz (60.8 kg) IBW/kg (Calculated) : 59.3 Heparin Dosing Weight: 60 kg  Vital Signs: Temp: 97.7 F (36.5 C) (02/09 1706) Temp Source: Oral (02/09 1706) BP: 124/75 (02/09 1706) Pulse Rate: 81 (02/09 1706)  Labs: Recent Labs    10/03/19 0258 10/03/19 0258 10/04/19 0328 10/04/19 0627 10/04/19 1202 10/05/19 0234 10/05/19 1204 10/05/19 1834  HGB 9.9*   < >  --  8.6*  --  8.1*  --   --   HCT 31.0*  --   --  26.8*  --  26.3*  --   --   PLT 155  --   --  126*  --  144*  --   --   LABPROT  --   --   --   --   --  18.8*  --   --   INR  --   --   --   --   --  1.6*  --   --   HEPARINUNFRC  --   --  0.20*  --    < > 0.11* 0.37 0.42  CREATININE 0.96  --  0.89  --   --   --   --   --    < > = values in this interval not displayed.    Estimated Creatinine Clearance: 51.1 mL/min (by C-G formula based on SCr of 0.89 mg/dL).  Assessment: 74 yr old female presented with acute leg pain, with left leg embolus (S/P embolectomy 2/5 PM). Pt was on warfarin PTA for a fib, with INR subtherapeutic at 1.8 on admission.   Pt now with hematoma S/P embolectomy. Patient returned to OR 2/7 for evacuation of hematoma and heparin restarted post-OR. Warfarin restarted 2/9 per MD (pt rec'd warfarin 5 mg PO X 1 this evening).  Heparin level on heparin infusion rate of 950 units/hr was therapeutic earlier today at 0.37 units/ml; confirmatory heparin level drawn ~6.5 hrs after initial therapeutic level was 0.42 units/ml, which remains within the desired goal range for this pt.  H/H 8.1/26.3, platelets 144. Per RN, no issues with IV or bleeding observed.  Goal of Therapy:  Monitor platelets by anticoagulation protocol: Yes  Heparin level goal 0.3-0.7 but aim for lower end due to recent bleeding INR goal  2.0-3.0   Plan:  Continue heparin infusion at 950 units/hr Monitor daily heparin level, INR, CBC Monitor for signs/symptoms of bleeding  Gillermina Hu, PharmD, BCPS, [facility] Clinical Pharmacist 10/05/19, 20:00 PM

## 2019-10-05 NOTE — Progress Notes (Signed)
Notified by CCMD. Patient had 10 beat run vtach. Patient's pacemaker resumed normal vpaced

## 2019-10-05 NOTE — Progress Notes (Addendum)
  Progress Note    10/05/2019 7:40 AM 2 Days Post-Op  Subjective:  LLE feeling better   Vitals:   10/04/19 2048 10/05/19 0700  BP: 92/75 100/64  Pulse: 80 80  Resp: 19 17  Temp: 98.2 F (36.8 C) 98.1 F (36.7 C)  SpO2: 96% 96%   Physical Exam: Lungs:  Non labored Incisions:  L popliteal incision c/d/i Extremities:  Palpable L DP pulse; minimal JP output Neurologic: A&O  CBC    Component Value Date/Time   WBC 5.7 10/05/2019 0234   RBC 2.69 (L) 10/05/2019 0234   HGB 8.1 (L) 10/05/2019 0234   HGB 14.1 09/29/2008 1038   HCT 26.3 (L) 10/05/2019 0234   HCT 41.7 09/29/2008 1038   PLT 144 (L) 10/05/2019 0234   PLT 134 (L) 09/29/2008 1038   MCV 97.8 10/05/2019 0234   MCV 89.9 09/29/2008 1038   MCH 30.1 10/05/2019 0234   MCHC 30.8 10/05/2019 0234   RDW 16.1 (H) 10/05/2019 0234   RDW 16.2 (H) 09/29/2008 1038   LYMPHSABS 1.3 10/01/2019 1533   LYMPHSABS 0.8 (L) 09/29/2008 1038   MONOABS 0.5 10/01/2019 1533   MONOABS 0.3 09/29/2008 1038   EOSABS 0.1 10/01/2019 1533   EOSABS 0.0 09/29/2008 1038   BASOSABS 0.0 10/01/2019 1533   BASOSABS 0.0 09/29/2008 1038    BMET    Component Value Date/Time   NA 141 10/04/2019 0328   K 4.1 10/04/2019 0328   CL 102 10/04/2019 0328   CO2 21 (L) 10/04/2019 0328   GLUCOSE 100 (H) 10/04/2019 0328   BUN 18 10/04/2019 0328   CREATININE 0.89 10/04/2019 0328   CALCIUM 8.2 (L) 10/04/2019 0328   GFRNONAA >60 10/04/2019 0328   GFRAA >60 10/04/2019 0328    INR    Component Value Date/Time   INR 1.6 (H) 10/05/2019 0234     Intake/Output Summary (Last 24 hours) at 10/05/2019 0740 Last data filed at 10/05/2019 0620 Gross per 24 hour  Intake 1079.92 ml  Output 1625 ml  Net -545.08 ml     Assessment/Plan:  73 y.o. female is s/p thrombectomy of LLE with subsequent hematoma drainage 2 Days Post-Op   Perfusing LLE well with palpable L DP 20cc output JP; remove JP drain this morning Pharmacy dosing heparin; 5mg  coumadin ordered for  tonight; follow daily INR Family refusing SNF placement; will consult TOC team to arrange [facility] PT/OT   Dagoberto Ligas, PA-C [facility] [phone] 10/05/2019 7:40 AM  Agree with above Home when INR > 2 Start warfarin today PT OT  Ruta Hinds, MD [facility] [place]: [phone]

## 2019-10-05 NOTE — Progress Notes (Signed)
Physical Therapy Treatment Patient Details Name: Angela [name] MRN: [id number] DOB: 1945/09/20 Today's Date: 10/05/2019    History of Present Illness 74 year old female with a history of A. fib (on Coumadin), DVT, nonischemic cardiomyopathy, rheumatic heart disease, sinus sick syndrome, CHF, who presented to the emergency department complaining of left lower extremity pain. This is been present for the last several days. She was seen in the ED a few days ago for chest pain. He also notes that she was complaining that her left leg was cold. Pt found to have thrombus left popliteal artery. She underwent left popliteal and tibial embolectomy 10/01/19.    PT Comments    Patient seen for mobility progression. Pt is making progress toward PT goals and tolerated increased activity well with VSS. Pt requires min A and max cues for functional transfer/gait training.  Pt's son prefers to take pt home at d/c and given pt's progress recommend HHPT for further skilled PT services to maximize independence and safety with mobility.    Follow Up Recommendations  Supervision/Assistance - 24 hour;Home health PT     Equipment Recommendations  3in1 (PT)    Recommendations for Other Services       Precautions / Restrictions Precautions Precautions: Fall Restrictions Weight Bearing Restrictions: No    Mobility  Bed Mobility Overal bed mobility: Needs Assistance Bed Mobility: Supine to Sit     Supine to sit: Min assist     General bed mobility comments: cues for sequencing and use of rail; increased time and effort; assist to scoot hips to EOB  Transfers Overall transfer level: Needs assistance Equipment used: Rolling walker (2 wheeled) Transfers: Sit to/from Stand Sit to Stand: Min assist         General transfer comment: cues for safe hand placement each trial; assist to power up into standing and to steady  Ambulation/Gait Ambulation/Gait assistance: Min assist;+2 safety/equipment Gait  Distance (Feet): (~30 ft total ) Assistive device: Rolling walker (2 wheeled) Gait Pattern/deviations: Step-to pattern;Decreased step length - right;Decreased stance time - left;Trunk flexed Gait velocity: decreased   General Gait Details: assist to steady at times however most assistance needed to manage RW; pt tends to maintain L LE IR; improving steps length symmetry with distance and cues; cues for upright posture and safe use of AD    Stairs             Wheelchair Mobility    Modified Rankin (Stroke Patients Only)       Balance Overall balance assessment: Needs assistance Sitting-balance support: No upper extremity supported;Feet supported Sitting balance-Leahy Scale: Fair     Standing balance support: Bilateral upper extremity supported;During functional activity Standing balance-Leahy Scale: Poor                              Cognition Arousal/Alertness: Awake/alert Behavior During Therapy: WFL for tasks assessed/performed;Anxious Overall Cognitive Status: No family/caregiver present to determine baseline cognitive functioning Area of Impairment: Memory;Following commands;Safety/judgement;Problem solving                     Memory: Decreased short-term memory Following Commands: Follows one step commands inconsistently;Follows one step commands with increased time Safety/Judgement: Decreased awareness of deficits;Decreased awareness of safety   Problem Solving: Difficulty sequencing;Requires verbal cues;Requires tactile cues General Comments: max cues needed for tasks due to poor sequencing       Exercises      General Comments General comments (skin  integrity, edema, etc.): VSS on RA       Pertinent Vitals/Pain Pain Assessment: Faces Faces Pain Scale: Hurts a little bit Pain Location: LLE with mobility Pain Descriptors / Indicators: Guarding;Discomfort;Sore Pain Intervention(s): Limited activity within patient's  tolerance;Monitored during session;Repositioned    Home Living                      Prior Function            PT Goals (current goals can now be found in the care plan section) Acute Rehab PT Goals Patient Stated Goal: home Progress towards PT goals: Progressing toward goals    Frequency    Min 3X/week      PT Plan Discharge plan needs to be updated    Co-evaluation PT/OT/SLP Co-Evaluation/Treatment: Yes Reason for Co-Treatment: For patient/therapist safety;To address functional/ADL transfers PT goals addressed during session: Mobility/safety with mobility        AM-PAC PT "6 Clicks" Mobility   Outcome Measure  Help needed turning from your back to your side while in a flat bed without using bedrails?: A Little Help needed moving from lying on your back to sitting on the side of a flat bed without using bedrails?: A Little Help needed moving to and from a bed to a chair (including a wheelchair)?: A Little Help needed standing up from a chair using your arms (e.g., wheelchair or bedside chair)?: A Little Help needed to walk in hospital room?: A Little Help needed climbing 3-5 steps with a railing? : Total 6 Click Score: 16    End of Session Equipment Utilized During Treatment: Gait belt Activity Tolerance: Patient tolerated treatment well Patient left: in chair;with call bell/phone within reach;with chair alarm set Nurse Communication: Mobility status PT Visit Diagnosis: Unsteadiness on feet (R26.81);Muscle weakness (generalized) (M62.81);Pain;Difficulty in walking, not elsewhere classified (R26.2) Pain - Right/Left: Left Pain - part of body: Leg     Time: 0923-0950 PT Time Calculation (min) (ACUTE ONLY): 27 min  Charges:  $Gait Training: 8-22 mins                     Earney Navy, PTA Acute Rehabilitation Services Pager: [phone] Office: [phone]     Darliss Cheney 10/05/2019, 11:25 AM

## 2019-10-05 NOTE — Progress Notes (Signed)
Occupational Therapy Treatment Patient Details Name: Angela [name] Working MRN: [id number] DOB: 1945-09-21 Today's Date: 10/05/2019    History of present illness 74 year old female with a history of A. fib (on Coumadin), DVT, nonischemic cardiomyopathy, rheumatic heart disease, sinus sick syndrome, CHF, who presented to the emergency department complaining of left lower extremity pain. This is been present for the last several days. She was seen in the ED a few days ago for chest pain. He also notes that she was complaining that her left leg was cold. Pt found to have thrombus left popliteal artery. She underwent left popliteal and tibial embolectomy 10/01/19.   OT comments  Pt making steady progress towards OT goals this session. Pt seen in conjunction with PT for functional mobility progression. Pt continues to be limited by coginitve deficits needing MAX cues to sequence mobility and RW mgmt. Overall, pt requires MIN A for household distance functional mobility with RW. Pt completed standing grooming at sink with RW and min guard assist for balance. Pt currently requires MAX for LB ADLs from EOB. Pt and family declining SNF placement, updated DC recs to [facility] with DME listed below. Will let OTR know about change in POC. Will continue to follow acutely per POC.    Follow Up Recommendations  Home health OT;Supervision/Assistance - 24 hour    Equipment Recommendations  3 in 1 bedside commode    Recommendations for Other Services      Precautions / Restrictions Precautions Precautions: Fall Restrictions Weight Bearing Restrictions: No       Mobility Bed Mobility Overal bed mobility: Needs Assistance Bed Mobility: Supine to Sit     Supine to sit: Min assist     General bed mobility comments: cues for sequencing and use of rail; increased time and effort; assist to scoot hips to EOB  Transfers Overall transfer level: Needs assistance Equipment used: Rolling walker (2 wheeled) Transfers: Sit  to/from Stand Sit to Stand: Min assist         General transfer comment: cues for safe hand placement each trial; assist to power up into standing and to steady    Balance Overall balance assessment: Needs assistance Sitting-balance support: No upper extremity supported;Feet supported Sitting balance-Leahy Scale: Fair     Standing balance support: Bilateral upper extremity supported;During functional activity Standing balance-Leahy Scale: Poor Standing balance comment: reliant on external support                           ADL either performed or assessed with clinical judgement   ADL Overall ADL's : Needs assistance/impaired     Grooming: Wash/dry hands;Standing;Min guard Grooming Details (indicate cue type and reason): min guard for balance at sink             Lower Body Dressing: Maximal assistance;Sitting/lateral leans Lower Body Dressing Details (indicate cue type and reason): to don socks EOB Toilet Transfer: Minimal assistance;+2 for physical assistance;Ambulation;RW Toilet Transfer Details (indicate cue type and reason): simulated to recliner; MIN A for functional mobility with RW and MAX cues for RW mgmt and overall technique       Tub/Shower Transfer Details (indicate cue type and reason): pt reports she doesn't have a shower? and will do sponge baths at home Functional mobility during ADLs: Minimal assistance;Moderate assistance;+2 for physical assistance;+2 for safety/equipment;Cueing for sequencing;Rolling walker General ADL Comments: pt able to progress functional mobility this session. pt limited by impaired balance, generalized weakness and cognition  Vision       Perception     Praxis      Cognition Arousal/Alertness: Awake/alert Behavior During Therapy: WFL for tasks assessed/performed;Anxious Overall Cognitive Status: No family/caregiver present to determine baseline cognitive functioning Area of Impairment: Memory;Following  commands;Safety/judgement;Problem solving                     Memory: Decreased short-term memory Following Commands: Follows one step commands inconsistently;Follows one step commands with increased time Safety/Judgement: Decreased awareness of deficits;Decreased awareness of safety   Problem Solving: Difficulty sequencing;Requires verbal cues;Requires tactile cues General Comments: max cues needed for tasks due to poor sequencing         Exercises     Shoulder Instructions       General Comments VSS on RA    Pertinent Vitals/ Pain       Pain Assessment: Faces Faces Pain Scale: Hurts a little bit Pain Location: LLE with mobility Pain Descriptors / Indicators: Guarding;Discomfort;Sore Pain Intervention(s): Limited activity within patient's tolerance;Monitored during session;Repositioned  Home Living                                          Prior Functioning/Environment              Frequency  Min 2X/week        Progress Toward Goals  OT Goals(current goals can now be found in the care plan section)  Progress towards OT goals: Progressing toward goals  Acute Rehab OT Goals Patient Stated Goal: home OT Goal Formulation: With patient Time For Goal Achievement: 10/16/19 Potential to Achieve Goals: Good  Plan Discharge plan needs to be updated    Co-evaluation      Reason for Co-Treatment: For patient/therapist safety;To address functional/ADL transfers PT goals addressed during session: Mobility/safety with mobility        AM-PAC OT "6 Clicks" Daily Activity     Outcome Measure   Help from another person eating meals?: None Help from another person taking care of personal grooming?: A Little Help from another person toileting, which includes using toliet, bedpan, or urinal?: A Lot Help from another person bathing (including washing, rinsing, drying)?: A Lot Help from another person to put on and taking off regular upper  body clothing?: A Little Help from another person to put on and taking off regular lower body clothing?: Total 6 Click Score: 15    End of Session Equipment Utilized During Treatment: Gait belt;Rolling walker  OT Visit Diagnosis: Other abnormalities of gait and mobility (R26.89);Muscle weakness (generalized) (M62.81);Pain;Other symptoms and signs involving cognitive function Pain - Right/Left: Left Pain - part of body: Leg   Activity Tolerance Patient tolerated treatment well   Patient Left in chair;with call bell/phone within reach;with chair alarm set   Nurse Communication Mobility status        Time: [id number] OT Time Calculation (min): 29 min  Charges: OT General Charges $OT Visit: 1 Visit OT Treatments $Self Care/Home Management : 8-22 mins  Lanier Clam., COTA/L Acute Rehabilitation Services [id number]    Ihor Gully 10/05/2019, 12:17 PM

## 2019-10-05 NOTE — Progress Notes (Signed)
ANTICOAGULATION CONSULT NOTE   Pharmacy Consult for heparin Indication: L-leg embolus/AFib  No Known Allergies  Patient Measurements: Height: 5\' 6"  (167.6 cm) Weight: 134 lb 0.6 oz (60.8 kg) IBW/kg (Calculated) : 59.3 Heparin Dosing Weight: 60kg  Vital Signs: Temp: 98.2 F (36.8 C) (02/08 2048) Temp Source: Oral (02/08 2048) BP: 92/75 (02/08 2048) Pulse Rate: 80 (02/08 2048)  Labs: Recent Labs    10/03/19 0258 10/03/19 0258 10/04/19 0328 10/04/19 0627 10/04/19 1202 10/05/19 0234  HGB 9.9*   < >  --  8.6*  --  8.1*  HCT 31.0*  --   --  Angela.8*  --  Angela.3*  PLT 155  --   --  126*  --  144*  HEPARINUNFRC  --   --  0.20*  --  0.42 0.11*  CREATININE 0.96  --  0.89  --   --   --    < > = values in this interval not displayed.    Estimated Creatinine Clearance: 51.1 mL/min (by C-G formula based on SCr of 0.89 mg/dL).  Assessment: Angela [name] presenting with acute leg pain, with left leg embolus and s/p embolectomy 2/5 pm.  On warfarin PTA for AFib, INR subtherapeutic at 1.8 on admission.   Pt now with hematoma s/p embolectomy. Patient returned to OR 2/7 for evacuation of hematoma. Heparin restarted post-OR. Noted plans to restart warfarin on 2/9  Heparin level down to subtherapeutic (0.11) on gtt at 800 units/hr. No issues with line or bleeding reported per RN.  Goal of Therapy:  Monitor platelets by anticoagulation protocol: Yes  Heparin level goal 0.3-0.7 but aim for lower end due to recent bleeding   Plan:  -Increase heparin to 950 units/hr -Will f/u 8hr heparin level  Sherlon Handing, PharmD, BCPS Please see amion for complete clinical pharmacist phone list 10/05/2019 4:13 AM

## 2019-10-05 NOTE — Plan of Care (Signed)
Poc progressing.  

## 2019-10-05 NOTE — Progress Notes (Signed)
ANTICOAGULATION CONSULT NOTE   Pharmacy Consult for heparin Indication: L-leg embolus/AFib  No Known Allergies  Patient Measurements: Height: 5\' 6"  (167.6 cm) Weight: 134 lb 0.6 oz (60.8 kg) IBW/kg (Calculated) : 59.3 Heparin Dosing Weight: 60kg  Vital Signs: Temp: 98.1 F (36.7 C) (02/09 0700) Temp Source: Oral (02/09 0700) BP: 95/71 (02/09 1110) Pulse Rate: 80 (02/09 0700)  Labs: Recent Labs    10/03/19 0258 10/03/19 0258 10/04/19 0328 10/04/19 0328 10/04/19 0627 10/04/19 1202 10/05/19 0234 10/05/19 1204  HGB 9.9*   < >  --   --  8.6*  --  8.1*  --   HCT 31.0*  --   --   --  26.8*  --  26.3*  --   PLT 155  --   --   --  126*  --  144*  --   LABPROT  --   --   --   --   --   --  18.8*  --   INR  --   --   --   --   --   --  1.6*  --   HEPARINUNFRC  --   --  0.20*   < >  --  0.42 0.11* 0.37  CREATININE 0.96  --  0.89  --   --   --   --   --    < > = values in this interval not displayed.    Estimated Creatinine Clearance: 51.1 mL/min (by C-G formula based on SCr of 0.89 mg/dL).  Assessment: 51 YOF presenting with acute leg pain, with left leg embolus and s/p embolectomy 2/5 pm.  On warfarin PTA for AFib, INR subtherapeutic at 1.8 on admission.   Pt now with hematoma s/p embolectomy. Patient returned to OR 2/7 for evacuation of hematoma and eparin restarted post-OR. Warfarin restarted 2/9 per MD -heparin level= 0.37  Goal of Therapy:  Monitor platelets by anticoagulation protocol: Yes  Heparin level goal 0.3-0.7 but aim for lower end due to recent bleeding   Plan:  -Continue heparin at 950 units/hr -Heparin level in 6 hours (to confirm at goal) and daily wth CBC daily  Hildred Laser, PharmD Clinical Pharmacist **Pharmacist phone directory can now be found on [email or web address] ([email or web address]).  Listed under [place].

## 2019-10-05 NOTE — Progress Notes (Signed)
JP drain removed per order. Pt tolerated well. 20 cc of fluid removed from drain. Jerald Kief, RN

## 2019-10-06 ENCOUNTER — Ambulatory Visit: Payer: Medicare Other | Admitting: Physician Assistant

## 2019-10-06 LAB — CBC
HCT: 26.6 % — ABNORMAL LOW (ref 36.0–46.0)
Hemoglobin: 8.3 g/dL — ABNORMAL LOW (ref 12.0–15.0)
MCH: 30.3 pg (ref 26.0–34.0)
MCHC: 31.2 g/dL (ref 30.0–36.0)
MCV: 97.1 fL (ref 80.0–100.0)
Platelets: 152 10*3/uL (ref 150–400)
RBC: 2.74 MIL/uL — ABNORMAL LOW (ref 3.87–5.11)
RDW: 16 % — ABNORMAL HIGH (ref 11.5–15.5)
WBC: 5.1 10*3/uL (ref 4.0–10.5)
nRBC: 0 % (ref 0.0–0.2)

## 2019-10-06 LAB — PROTIME-INR
INR: 1.3 — ABNORMAL HIGH (ref 0.8–1.2)
Prothrombin Time: 16 seconds — ABNORMAL HIGH (ref 11.4–15.2)

## 2019-10-06 LAB — HEPARIN LEVEL (UNFRACTIONATED): Heparin Unfractionated: 0.6 IU/mL (ref 0.30–0.70)

## 2019-10-06 MED ORDER — WARFARIN SODIUM 5 MG PO TABS
5.0000 mg | ORAL_TABLET | Freq: Every day | ORAL | Status: DC
  Administered 2019-10-06 – 2019-10-08 (×3): 5 mg via ORAL
  Filled 2019-10-06 (×4): qty 1

## 2019-10-06 NOTE — Progress Notes (Addendum)
  Progress Note    10/06/2019 7:51 AM 3 Days Post-Op  Subjective:  Wants to go home.   Vitals:   10/06/19 0439 10/06/19 0441  BP: 123/68   Pulse: 61 (!) 41  Resp: 15 16  Temp: 98.1 F (36.7 C)   SpO2: 99% 99%   Physical Exam Lungs:  Non labored Incisions:  L popliteal incision c/d/i; calf soft; palpable L DP Abdomen:  soft Neurologic: A&O  CBC    Component Value Date/Time   WBC 5.1 10/06/2019 0311   RBC 2.74 (L) 10/06/2019 0311   HGB 8.3 (L) 10/06/2019 0311   HGB 14.1 09/29/2008 1038   HCT 26.6 (L) 10/06/2019 0311   HCT 41.7 09/29/2008 1038   PLT 152 10/06/2019 0311   PLT 134 (L) 09/29/2008 1038   MCV 97.1 10/06/2019 0311   MCV 89.9 09/29/2008 1038   MCH 30.3 10/06/2019 0311   MCHC 31.2 10/06/2019 0311   RDW 16.0 (H) 10/06/2019 0311   RDW 16.2 (H) 09/29/2008 1038   LYMPHSABS 1.3 10/01/2019 1533   LYMPHSABS 0.8 (L) 09/29/2008 1038   MONOABS 0.5 10/01/2019 1533   MONOABS 0.3 09/29/2008 1038   EOSABS 0.1 10/01/2019 1533   EOSABS 0.0 09/29/2008 1038   BASOSABS 0.0 10/01/2019 1533   BASOSABS 0.0 09/29/2008 1038    BMET    Component Value Date/Time   NA 141 10/04/2019 0328   K 4.1 10/04/2019 0328   CL 102 10/04/2019 0328   CO2 21 (L) 10/04/2019 0328   GLUCOSE 100 (H) 10/04/2019 0328   BUN 18 10/04/2019 0328   CREATININE 0.89 10/04/2019 0328   CALCIUM 8.2 (L) 10/04/2019 0328   GFRNONAA >60 10/04/2019 0328   GFRAA >60 10/04/2019 0328    INR    Component Value Date/Time   INR 1.3 (H) 10/06/2019 0311     Intake/Output Summary (Last 24 hours) at 10/06/2019 0751 Last data filed at 10/06/2019 0400 Gross per 24 hour  Intake 265 ml  Output 1295 ml  Net -1030 ml     Assessment/Plan:  74 y.o. female is s/p thrombectomy LLE with subsequent evacuation of calf hematoma 3 Days Post-Op   L foot well perfused Coumadin restarted last night; INR 1.3 this morning; continue bridge with heparin to therapeutic INR OOB with PT/OT Family refusing SNF placement;  [facility] team consulted for [place], PA-C [facility] [phone] 10/06/2019 7:51 AM  Agree with above.  Incisions healing DP pulse foot warm D/c home when [place], MD [facility] [place] Office: [phone]

## 2019-10-06 NOTE — Progress Notes (Signed)
ANTICOAGULATION CONSULT NOTE   Pharmacy Consult for heparin Indication: L-leg embolus/AFib  No Known Allergies  Patient Measurements: Height: 5\' 6"  (167.6 cm) Weight: 131 lb 9.8 oz (59.7 kg) IBW/kg (Calculated) : 59.3 Heparin Dosing Weight: 60kg  Vital Signs: Temp: 98.4 F (36.9 C) (02/10 0828) Temp Source: Oral (02/10 0828) BP: 102/51 (02/10 0828) Pulse Rate: 81 (02/10 0828)  Labs: Recent Labs     0000 10/04/19 0328 10/04/19 0627 10/04/19 1202 10/05/19 0234 10/05/19 0234 10/05/19 1204 10/05/19 1834 10/06/19 0311  HGB   < >  --  8.6*  --  8.1*  --   --   --  8.3*  HCT  --   --  26.8*  --  26.3*  --   --   --  26.6*  PLT  --   --  126*  --  144*  --   --   --  152  LABPROT  --   --   --   --  18.8*  --   --   --  16.0*  INR  --   --   --   --  1.6*  --   --   --  1.3*  HEPARINUNFRC  --  0.20*  --    < > 0.11*   < > 0.37 0.42 0.60  CREATININE  --  0.89  --   --   --   --   --   --   --    < > = values in this interval not displayed.    Estimated Creatinine Clearance: 51.1 mL/min (by C-G formula based on SCr of 0.89 mg/dL).  Assessment: 37 YOF presenting with acute leg pain, with left leg embolus and s/p embolectomy 2/5 pm.  On warfarin PTA for AFib, INR subtherapeutic at 1.8 on admission.   Pt now with hematoma s/p embolectomy. Patient returned to OR 2/7 for evacuation of hematoma and eparin restarted post-OR. Warfarin restarted 2/9 per MD -heparin level= 0.6, INR= 1.3  Goal of Therapy:  Monitor platelets by anticoagulation protocol: Yes  Heparin level goal 0.3-0.7 but aim for lower end due to recent bleeding   Plan:  -Continue heparin at 950 units/hr -Heparin level daily wth CBC daily  Hildred Laser, PharmD Clinical Pharmacist **Pharmacist phone directory can now be found on [email or web address] ([email or web address]).  Listed under [place].

## 2019-10-06 NOTE — Progress Notes (Signed)
Physical Therapy Treatment Patient Details Name: Angela [name] MRN: [id number] DOB: 05-22-1946 Today's Date: 10/06/2019    History of Present Illness 74 year old female with a history of A. fib (on Coumadin), DVT, nonischemic cardiomyopathy, rheumatic heart disease, sinus sick syndrome, CHF, who presented to the emergency department complaining of left lower extremity pain. This is been present for the last several days. She was seen in the ED a few days ago for chest pain. He also notes that she was complaining that her left leg was cold. Pt found to have thrombus left popliteal artery. She underwent left popliteal and tibial embolectomy 10/01/19.    PT Comments    Patient seen for mobility progression. Pt requires max cues for mobility tasks and perseverating on feeling like she is falling while OOB. Pt requires min/mod A for all mobility this session.  PT will continue to follow acutely and progress as tolerated.    Follow Up Recommendations  Supervision/Assistance - 24 hour;Home health PT     Equipment Recommendations  3in1 (PT)    Recommendations for Other Services       Precautions / Restrictions Precautions Precautions: Fall    Mobility  Bed Mobility Overal bed mobility: Needs Assistance Bed Mobility: Supine to Sit     Supine to sit: Min assist     General bed mobility comments: cues for sequencing; use of rail; assist to scoot hips to EOB and to elevate trunk inton sitting   Transfers Overall transfer level: Needs assistance Equipment used: Rolling walker (2 wheeled) Transfers: Sit to/from Stand Sit to Stand: Mod assist Stand pivot transfers: Mod assist       General transfer comment: assist to power up into standing, maintain balance, and managing RW to pivot EOB to BSC; mod to stand from EOB and min A from [facility]; cues for safe hand placement and use of AD  Ambulation/Gait Ambulation/Gait assistance: Min assist;Mod assist Gait Distance (Feet): 12 Feet Assistive  device: Rolling walker (2 wheeled) Gait Pattern/deviations: Step-to pattern;Decreased step length - right;Decreased stance time - left;Trunk flexed Gait velocity: decreased   General Gait Details: cues for safe use of AD, step lengths, and upright posture; assist to steady and manage RW    Stairs             Wheelchair Mobility    Modified Rankin (Stroke Patients Only)       Balance Overall balance assessment: Needs assistance Sitting-balance support: No upper extremity supported;Feet supported Sitting balance-Leahy Scale: Fair     Standing balance support: Bilateral upper extremity supported;During functional activity Standing balance-Leahy Scale: Poor                              Cognition Arousal/Alertness: Awake/alert Behavior During Therapy: WFL for tasks assessed/performed;Anxious Overall Cognitive Status: No family/caregiver present to determine baseline cognitive functioning Area of Impairment: Memory;Following commands;Safety/judgement;Problem solving                     Memory: Decreased short-term memory Following Commands: Follows one step commands inconsistently;Follows one step commands with increased time Safety/Judgement: Decreased awareness of deficits;Decreased awareness of safety   Problem Solving: Difficulty sequencing;Requires verbal cues;Requires tactile cues General Comments: pt perseverating on "i'm going to fall" throughout session; max cues needed for tasks due to poor sequencing       Exercises      General Comments        Pertinent Vitals/Pain Pain Assessment: Faces  Faces Pain Scale: Hurts a little bit Pain Location: LLE with mobility Pain Descriptors / Indicators: Guarding;Discomfort;Sore Pain Intervention(s): Limited activity within patient's tolerance;Monitored during session;Repositioned    Home Living                      Prior Function            PT Goals (current goals can now be found  in the care plan section) Progress towards PT goals: Progressing toward goals    Frequency    Min 3X/week      PT Plan Current plan remains appropriate    Co-evaluation              AM-PAC PT "6 Clicks" Mobility   Outcome Measure  Help needed turning from your back to your side while in a flat bed without using bedrails?: A Little Help needed moving from lying on your back to sitting on the side of a flat bed without using bedrails?: A Little Help needed moving to and from a bed to a chair (including a wheelchair)?: A Little Help needed standing up from a chair using your arms (e.g., wheelchair or bedside chair)?: A Little Help needed to walk in hospital room?: A Little Help needed climbing 3-5 steps with a railing? : Total 6 Click Score: 16    End of Session Equipment Utilized During Treatment: Gait belt Activity Tolerance: Patient tolerated treatment well Patient left: in chair;with call bell/phone within reach;with chair alarm set Nurse Communication: Mobility status PT Visit Diagnosis: Unsteadiness on feet (R26.81);Muscle weakness (generalized) (M62.81);Pain;Difficulty in walking, not elsewhere classified (R26.2) Pain - Right/Left: Left Pain - part of body: Leg     Time: 1010-1044 PT Time Calculation (min) (ACUTE ONLY): 34 min  Charges:  $Gait Training: 23-37 mins                     Angela [name], PTA Acute Rehabilitation Services Pager: [phone] Office: [phone]     Darliss Cheney 10/06/2019, 1:11 PM

## 2019-10-06 NOTE — Care Management Important Message (Signed)
Important Message  Patient Details  Name: Angela [name] MRN: [id number] Date of Birth: 11/28/1945   Medicare Important Message Given:  Yes     Shelda Altes 10/06/2019, 1:52 PM

## 2019-10-07 LAB — CBC
HCT: 27.4 % — ABNORMAL LOW (ref 36.0–46.0)
Hemoglobin: 8.6 g/dL — ABNORMAL LOW (ref 12.0–15.0)
MCH: 30.5 pg (ref 26.0–34.0)
MCHC: 31.4 g/dL (ref 30.0–36.0)
MCV: 97.2 fL (ref 80.0–100.0)
Platelets: 175 10*3/uL (ref 150–400)
RBC: 2.82 MIL/uL — ABNORMAL LOW (ref 3.87–5.11)
RDW: 16.2 % — ABNORMAL HIGH (ref 11.5–15.5)
WBC: 5 10*3/uL (ref 4.0–10.5)
nRBC: 0 % (ref 0.0–0.2)

## 2019-10-07 LAB — HEPARIN LEVEL (UNFRACTIONATED): Heparin Unfractionated: 0.74 IU/mL — ABNORMAL HIGH (ref 0.30–0.70)

## 2019-10-07 LAB — PROTIME-INR
INR: 1.4 — ABNORMAL HIGH (ref 0.8–1.2)
Prothrombin Time: 16.6 seconds — ABNORMAL HIGH (ref 11.4–15.2)

## 2019-10-07 MED ORDER — POLYETHYLENE GLYCOL 3350 17 G PO PACK
17.0000 g | PACK | Freq: Every day | ORAL | Status: DC
  Administered 2019-10-07 – 2019-10-12 (×5): 17 g via ORAL
  Filled 2019-10-07 (×7): qty 1

## 2019-10-07 NOTE — TOC Progression Note (Signed)
Transition of Care (TOC) - Progression Note  Marvetta Gibbons RN, BSN Transitions of Care Unit 4E- RN Case Manager [phone]   Patient Details  Name: Angela [name] MRN: [id number] Date of Birth: 1945/10/26  Transition of Care [facility]) CM/SW Contact  Dahlia Client, Romeo Rabon, RN Phone Number: 10/07/2019, 11:40 AM  Clinical Narrative:    Received call from Son with [facility] choice selections- first choice [facility], second [place], third [facility]- have made call to [facility] with [facility] to see if they can accept referral- pt is still pending discharge- INR is not therapeutic. Per Butch Penny they can accept referral - with a start of care for either Sun/Mon- Feb. 14/15. - Depending on pt's day of transition home- may be later.  Will call for DME when pt ready for discharge.    Expected Discharge Plan: [place] Barriers to Discharge: Continued Medical Work up  Expected Discharge Plan and Services Expected Discharge Plan: [place]   Discharge Planning Services: CM Consult Post Acute Care Choice: Home Health, Durable Medical Equipment Living arrangements for the past 2 months: Single Family Home                 DME Arranged: 3-N-1 DME Agency: [facility]       HH Arranged: PT, OT [place] Agency: [place] ([facility])         Social Determinants of Health (SDOH) Interventions    Readmission Risk Interventions Readmission Risk Prevention Plan 01/19/2019 12/05/2018 12/05/2018  Transportation Screening Complete - Complete  PCP or Specialist Appt within 3-5 Days - - Complete  HRI or Home Care Consult Complete Complete (No Data)  Social Work Consult for [place] Planning/Counseling Complete Complete -  Palliative Care Screening Complete - Not Applicable  Medication Review Press photographer) Complete - Complete  Some recent data might be hidden

## 2019-10-07 NOTE — Progress Notes (Addendum)
  Progress Note    10/07/2019 8:08 AM 4 Days Post-Op  Subjective:  No new complaints   Vitals:   10/07/19 0346 10/07/19 0744  BP: 102/69 112/71  Pulse: 75 74  Resp: (!) 23 16  Temp: 98 F (36.7 C) (!) 97.5 F (36.4 C)  SpO2: 94% 100%   Physical Exam: Lungs:  Non labored Incisions:  L popliteal incision c/d/i Extremities:  Palpable L DP; L calf soft Neurologic: A&O  CBC    Component Value Date/Time   WBC 5.0 10/07/2019 0321   RBC 2.82 (L) 10/07/2019 0321   HGB 8.6 (L) 10/07/2019 0321   HGB 14.1 09/29/2008 1038   HCT 27.4 (L) 10/07/2019 0321   HCT 41.7 09/29/2008 1038   PLT 175 10/07/2019 0321   PLT 134 (L) 09/29/2008 1038   MCV 97.2 10/07/2019 0321   MCV 89.9 09/29/2008 1038   MCH 30.5 10/07/2019 0321   MCHC 31.4 10/07/2019 0321   RDW 16.2 (H) 10/07/2019 0321   RDW 16.2 (H) 09/29/2008 1038   LYMPHSABS 1.3 10/01/2019 1533   LYMPHSABS 0.8 (L) 09/29/2008 1038   MONOABS 0.5 10/01/2019 1533   MONOABS 0.3 09/29/2008 1038   EOSABS 0.1 10/01/2019 1533   EOSABS 0.0 09/29/2008 1038   BASOSABS 0.0 10/01/2019 1533   BASOSABS 0.0 09/29/2008 1038    BMET    Component Value Date/Time   NA 141 10/04/2019 0328   K 4.1 10/04/2019 0328   CL 102 10/04/2019 0328   CO2 21 (L) 10/04/2019 0328   GLUCOSE 100 (H) 10/04/2019 0328   BUN 18 10/04/2019 0328   CREATININE 0.89 10/04/2019 0328   CALCIUM 8.2 (L) 10/04/2019 0328   GFRNONAA >60 10/04/2019 0328   GFRAA >60 10/04/2019 0328    INR    Component Value Date/Time   INR 1.4 (H) 10/07/2019 0321     Intake/Output Summary (Last 24 hours) at 10/07/2019 [id number] Last data filed at 10/07/2019 0400 Gross per 24 hour  Intake 1267.71 ml  Output 300 ml  Net 967.71 ml     Assessment/Plan:  74 y.o. female is s/p thrombectomy LLE with subsequent calf hematoma evacuation 4 Days Post-Op   L foot well perfused Coumadin with heparin bridge; daily INR OOB with therapy teams; [facility] PT/OT arranged by [facility] team Home when INR  >2    Dagoberto Ligas, PA-C [facility] [phone] 10/07/2019 8:08 AM   I have independently interviewed and examined patient and agree with PA assessment and plan above.   Husna Krone C. Donzetta Matters, MD [facility] [place] Office: [phone] Pager: [phone]

## 2019-10-07 NOTE — Progress Notes (Signed)
ANTICOAGULATION CONSULT NOTE   Pharmacy Consult for heparin Indication: L-leg embolus/AFib  No Known Allergies  Patient Measurements: Height: 5\' 6"  (167.6 cm) Weight: 131 lb 9.8 oz (59.7 kg) IBW/kg (Calculated) : 59.3 Heparin Dosing Weight: 59 kg  Vital Signs: Temp: 97.5 F (36.4 C) (02/11 0744) Temp Source: Oral (02/11 0744) BP: 112/71 (02/11 0744) Pulse Rate: 79 (02/11 0831)  Labs: Recent Labs    10/05/19 0234 10/05/19 0234 10/05/19 1204 10/05/19 1834 10/06/19 0311 10/07/19 0321  HGB 8.1*   < >  --   --  8.3* 8.6*  HCT 26.3*  --   --   --  26.6* 27.4*  PLT 144*  --   --   --  152 175  LABPROT 18.8*  --   --   --  16.0* 16.6*  INR 1.6*  --   --   --  1.3* 1.4*  HEPARINUNFRC 0.11*  --    < > 0.42 0.60 0.74*   < > = values in this interval not displayed.    Estimated Creatinine Clearance: 51.1 mL/min (by C-G formula based on SCr of 0.89 mg/dL).  Assessment: 71 YOF presenting with acute leg pain, with left leg embolus and s/p embolectomy 2/5 pm.  On warfarin PTA for AFib, INR subtherapeutic at 1.8 on admission.   Pt with calf hematoma s/p embolectomy. Patient returned to OR 2/7 for evacuation of hematoma and heparin restarted post-op. Warfarin restarted 2/9 per MD with 5 mg daily.  INR 1.4   Heparin level just above therapeutic (0.74) on 950 units/hr  No bleeding reported.  Hgb low stable. Platelet count improved to 175K.   PTA warfarin: 4 mg daily except 5 mg on Tuesdays and Thursdays.  Goal of Therapy:  Monitor platelets by anticoagulation protocol: Yes  Heparin level goal 0.3-0.7 but aim for lower end due to recent bleeding INR 2-3   Plan:   Decrease heparin drip to 850 units/hr  Warfarin 5 mg daily at 6pm per MD  Daily heparin level, CBC and PT/INR.   Arty Baumgartner, [place] Phone: [phone] 10/07/2019 11:32 AM

## 2019-10-08 LAB — PROTIME-INR
INR: 1.3 — ABNORMAL HIGH (ref 0.8–1.2)
Prothrombin Time: 16.2 seconds — ABNORMAL HIGH (ref 11.4–15.2)

## 2019-10-08 LAB — CBC
HCT: 28.1 % — ABNORMAL LOW (ref 36.0–46.0)
Hemoglobin: 8.6 g/dL — ABNORMAL LOW (ref 12.0–15.0)
MCH: 30.2 pg (ref 26.0–34.0)
MCHC: 30.6 g/dL (ref 30.0–36.0)
MCV: 98.6 fL (ref 80.0–100.0)
Platelets: 201 10*3/uL (ref 150–400)
RBC: 2.85 MIL/uL — ABNORMAL LOW (ref 3.87–5.11)
RDW: 16.7 % — ABNORMAL HIGH (ref 11.5–15.5)
WBC: 5.2 10*3/uL (ref 4.0–10.5)
nRBC: 0 % (ref 0.0–0.2)

## 2019-10-08 LAB — HEPARIN LEVEL (UNFRACTIONATED): Heparin Unfractionated: 0.54 IU/mL (ref 0.30–0.70)

## 2019-10-08 MED ORDER — OXYCODONE HCL 5 MG PO TABS: 5.0000 mg | ORAL_TABLET | Freq: Four times a day (QID) | ORAL | 0 refills | Status: AC | PRN

## 2019-10-08 NOTE — Progress Notes (Signed)
Occupational Therapy Treatment Patient Details Name: Angela [name] MRN: [id number] DOB: 02/17/1946 Today's Date: 10/08/2019    History of present illness 74 year old female with a history of A. fib (on Coumadin), DVT, nonischemic cardiomyopathy, rheumatic heart disease, sinus sick syndrome, CHF, who presented to the emergency department complaining of left lower extremity pain. This is been present for the last several days. She was seen in the ED a few days ago for chest pain. He also notes that she was complaining that her left leg was cold. Pt found to have thrombus left popliteal artery. She underwent left popliteal and tibial embolectomy 10/01/19.   OT comments  Pt making steady progress towards OT goals this session.  Pt reports needing to have a BM upon entry. Pt ambulated from EOB>bathroom with MIN A +2 with RW for toileting needing cues for hand placment and MIN A to descend onto low toilet seat. Once positioned on toilet, pt reporting she no longer needs to defecate. Pt required total A for posterior pericare in standing. Overall, pt requires MINA  +2 for functional mobility with RW. Pt able to complete short distance functional mobility without AD, however still requires hand held  MIN A +2 for balance. Continue to recommend [place], will follow.    Follow Up Recommendations  Home health OT;Supervision/Assistance - 24 hour    Equipment Recommendations  3 in 1 bedside commode    Recommendations for Other Services      Precautions / Restrictions Precautions Precautions: Fall Restrictions Weight Bearing Restrictions: No       Mobility Bed Mobility Overal bed mobility: Needs Assistance Bed Mobility: Supine to Sit     Supine to sit: Min assist     General bed mobility comments: cues for sequencing; use of rail; assist to scoot hips to EOB and to elevate trunk into sitting  Transfers Overall transfer level: Needs assistance Equipment used: Rolling walker (2 wheeled) Transfers:  Sit to/from Stand Sit to Stand: Min assist;+2 safety/equipment         General transfer comment: overall pt requires MIN A +2 for sit<>stand from various surfaces. pt requires cues for hand placement during each trial.    Balance Overall balance assessment: Needs assistance Sitting-balance support: No upper extremity supported;Feet supported Sitting balance-Leahy Scale: Fair     Standing balance support: Bilateral upper extremity supported;During functional activity Standing balance-Leahy Scale: Poor Standing balance comment: reliant on external support                           ADL either performed or assessed with clinical judgement   ADL Overall ADL's : Needs assistance/impaired                         Toilet Transfer: Minimal assistance;Ambulation;RW;Regular Toilet;Grab bars;+2 for safety/equipment;Cueing for sequencing;Cueing for safety Toilet Transfer Details (indicate cue type and reason): MIN A +2 for safety with RW for functional mobility from EOB>toilet; MIN A +2 to descend onto low toilet surface and power into standing from low seat. pt requires cues for sequecing and safety d/t cog deficits. Pt perseverating on not falling and not wanting to run into therapists feet Toileting- Clothing Manipulation and Hygiene: Total assistance;Sit to/from stand;Minimal assistance Toileting - Clothing Manipulation Details (indicate cue type and reason): posterior pericare in standing; min a for balance with RW     Functional mobility during ADLs: Minimal assistance;+2 for physical assistance;+2 for safety/equipment;Cueing for sequencing;Rolling walker General  ADL Comments: pt able to progress functional mobility this session. pt continues to be limited by impaired balance, generalized weakness and cognition. Pt requires cues for RW mgmt and sequencing mobility.     Vision       Perception     Praxis      Cognition Arousal/Alertness: Awake/alert Behavior  During Therapy: WFL for tasks assessed/performed;Anxious Overall Cognitive Status: History of cognitive impairments - at baseline Area of Impairment: Memory;Following commands;Safety/judgement;Problem solving                     Memory: Decreased short-term memory Following Commands: Follows one step commands inconsistently;Follows one step commands with increased time Safety/Judgement: Decreased awareness of deficits;Decreased awareness of safety   Problem Solving: Difficulty sequencing;Requires verbal cues;Requires tactile cues;Slow processing General Comments: pt perseverating on not wanting to run into therapists feet; pt reports needing to have BM but sat on toilet reporting she doesn't need to go. Pt with STM deficits forgetting what she was eating for lunch despite having discussed pt eating spaghetti throughout session        Exercises     Shoulder Instructions       General Comments VSS on RA    Pertinent Vitals/ Pain       Pain Assessment: Faces Faces Pain Scale: Hurts a little bit Pain Location: LLE with mobility Pain Descriptors / Indicators: Discomfort Pain Intervention(s): Monitored during session;Repositioned  Home Living                                          Prior Functioning/Environment              Frequency  Min 2X/week        Progress Toward Goals  OT Goals(current goals can now be found in the care plan section)  Progress towards OT goals: Progressing toward goals  Acute Rehab OT Goals Patient Stated Goal: home OT Goal Formulation: With patient Time For Goal Achievement: 10/16/19 Potential to Achieve Goals: Good  Plan Discharge plan remains appropriate    Co-evaluation                 AM-PAC OT "6 Clicks" Daily Activity     Outcome Measure   Help from another person eating meals?: None Help from another person taking care of personal grooming?: A Little Help from another person toileting, which  includes using toliet, bedpan, or urinal?: A Lot Help from another person bathing (including washing, rinsing, drying)?: A Lot Help from another person to put on and taking off regular upper body clothing?: A Little Help from another person to put on and taking off regular lower body clothing?: Total 6 Click Score: 15    End of Session Equipment Utilized During Treatment: Gait belt;Rolling walker  OT Visit Diagnosis: Other abnormalities of gait and mobility (R26.89);Muscle weakness (generalized) (M62.81);Pain;Other symptoms and signs involving cognitive function Pain - Right/Left: Left Pain - part of body: Leg   Activity Tolerance Patient tolerated treatment well   Patient Left in chair;with call bell/phone within reach;with chair alarm set   Nurse Communication          Time: [id number] OT Time Calculation (min): 31 min  Charges: OT General Charges $OT Visit: 1 Visit OT Treatments $Self Care/Home Management : 8-22 mins  Lanier Clam., COTA/L Acute Rehabilitation Services [phone] [place]  10/08/2019, 4:25 PM

## 2019-10-08 NOTE — Progress Notes (Signed)
ANTICOAGULATION CONSULT NOTE   Pharmacy Consult for heparin Indication: L-leg embolus/AFib  No Known Allergies  Patient Measurements: Height: 5\' 6"  (167.6 cm) Weight: 132 lb 11.5 oz (60.2 kg) IBW/kg (Calculated) : 59.3 Heparin Dosing Weight: 60 kg  Vital Signs: Temp: 97.9 F (36.6 C) (02/12 0825) Temp Source: Oral (02/12 0825) BP: 110/82 (02/12 0825) Pulse Rate: 87 (02/12 0904)  Labs: Recent Labs    10/06/19 0311 10/06/19 0311 10/07/19 0321 10/08/19 0219  HGB 8.3*   < > 8.6* 8.6*  HCT 26.6*  --  27.4* 28.1*  PLT 152  --  175 201  LABPROT 16.0*  --  16.6* 16.2*  INR 1.3*  --  1.4* 1.3*  HEPARINUNFRC 0.60  --  0.74* 0.54   < > = values in this interval not displayed.    Estimated Creatinine Clearance: 51.1 mL/min (by C-G formula based on SCr of 0.89 mg/dL).  Assessment: 15 YOF presenting with acute leg pain, with left leg embolus and s/p embolectomy 2/5 pm.  On warfarin PTA for AFib, INR subtherapeutic at 1.8 on admission.   Pt with calf hematoma s/p embolectomy. Patient returned to OR 2/7 for evacuation of hematoma and heparin restarted post-op. Warfarin restarted 2/9 per MD.  INR 1.3. INR not increasing yet after Warfarin 5 mg daily x 3 days   Heparin level is therapeutic (0.54) on 850 units/hr  No bleeding reported.  Hgb low stable. Platelet count improved to 201K.   PTA warfarin: 4 mg daily except 5 mg on Tuesdays and Thursdays.  Goal of Therapy:  Monitor platelets by anticoagulation protocol: Yes  Heparin level goal 0.3-0.7 but aim for lower end due to recent bleeding INR 2-3   Plan:   Continue heparin drip at 850 units/hr  Warfarin 5 mg daily at 6pm per MD  Daily heparin level, CBC and PT/INR.  Consider increasing Warfarin dose for a day or two if INR not trending up tomorrow?   Arty Baumgartner, [place] Phone: [phone] 10/08/2019 10:51 AM

## 2019-10-08 NOTE — Progress Notes (Signed)
Physical Therapy Treatment Patient Details Name: Angela [name] MRN: [id number] DOB: 1946-07-29 Today's Date: 10/08/2019    History of Present Illness 74 year old female with a history of A. fib (on Coumadin), DVT, nonischemic cardiomyopathy, rheumatic heart disease, sinus sick syndrome, CHF, who presented to the emergency department complaining of left lower extremity pain. This is been present for the last several days. She was seen in the ED a few days ago for chest pain. He also notes that she was complaining that her left leg was cold. Pt found to have thrombus left popliteal artery. She underwent left popliteal and tibial embolectomy 10/01/19.    PT Comments    Patient seen for mobility progression. Pt is making progress toward PT goals and tolerated increased gait distance. Pt requires min-min A +2 for mod-max cues for mobility tasks given cognitive deficits. Continue to progress as tolerated.    Follow Up Recommendations  Supervision/Assistance - 24 hour;Home health PT     Equipment Recommendations  3in1 (PT)    Recommendations for Other Services       Precautions / Restrictions Precautions Precautions: Fall Precaution Comments: drain L distal LE Restrictions Weight Bearing Restrictions: No    Mobility  Bed Mobility Overal bed mobility: Needs Assistance Bed Mobility: Supine to Sit     Supine to sit: Min assist     General bed mobility comments: cues for sequencing; use of rail; assist to scoot hips to EOB and to elevate trunk into sitting  Transfers Overall transfer level: Needs assistance Equipment used: Rolling walker (2 wheeled) Transfers: Sit to/from Stand Sit to Stand: Min assist;+2 safety/equipment         General transfer comment: overall pt requires MIN A +2 for sit<>stand from various surfaces. pt requires cues for hand placement during each trial.  Ambulation/Gait Ambulation/Gait assistance: Min assist;+2 safety/equipment;+2 physical assistance Gait  Distance (Feet): (~60 ft total during session; 2 seated breaks) Assistive device: Rolling walker (2 wheeled);2 person hand held assist Gait Pattern/deviations: Step-to pattern;Decreased step length - right;Decreased stance time - left;Trunk flexed;Step-through pattern(maintains L LE IR) Gait velocity: decreased   General Gait Details: cues for safe use of AD, step lengths, and upright posture; assist to steady and manage RW; short distance with +2 HHA and pt with improved urpright posture    Stairs             Wheelchair Mobility    Modified Rankin (Stroke Patients Only)       Balance Overall balance assessment: Needs assistance Sitting-balance support: No upper extremity supported;Feet supported Sitting balance-Leahy Scale: Fair     Standing balance support: Bilateral upper extremity supported;During functional activity Standing balance-Leahy Scale: Poor Standing balance comment: reliant on external support                            Cognition Arousal/Alertness: Awake/alert Behavior During Therapy: WFL for tasks assessed/performed;Anxious Overall Cognitive Status: History of cognitive impairments - at baseline Area of Impairment: Memory;Following commands;Safety/judgement;Problem solving                   Current Attention Level: Focused Memory: Decreased short-term memory Following Commands: Follows one step commands inconsistently;Follows one step commands with increased time Safety/Judgement: Decreased awareness of deficits;Decreased awareness of safety Awareness: Intellectual Problem Solving: Difficulty sequencing;Requires verbal cues;Requires tactile cues;Slow processing General Comments: pt perseverating on not wanting to run into therapists feet; pt reports needing to have BM but sat on toilet reporting she doesn't  need to go. Pt with STM deficits forgetting what she was eating for lunch despite having discussed pt eating spaghetti throughout  session      Exercises      General Comments General comments (skin integrity, edema, etc.): VSS on RA      Pertinent Vitals/Pain Pain Assessment: Faces Faces Pain Scale: Hurts a little bit Pain Location: LLE with mobility Pain Descriptors / Indicators: Discomfort Pain Intervention(s): Monitored during session;Repositioned    Home Living                      Prior Function            PT Goals (current goals can now be found in the care plan section) Acute Rehab PT Goals Patient Stated Goal: home Progress towards PT goals: Progressing toward goals    Frequency    Min 3X/week      PT Plan Current plan remains appropriate    Co-evaluation PT/OT/SLP Co-Evaluation/Treatment: Yes Reason for Co-Treatment: For patient/therapist safety;To address functional/ADL transfers PT goals addressed during session: Mobility/safety with mobility        AM-PAC PT "6 Clicks" Mobility   Outcome Measure  Help needed turning from your back to your side while in a flat bed without using bedrails?: A Little Help needed moving from lying on your back to sitting on the side of a flat bed without using bedrails?: A Little Help needed moving to and from a bed to a chair (including a wheelchair)?: A Little Help needed standing up from a chair using your arms (e.g., wheelchair or bedside chair)?: A Little Help needed to walk in hospital room?: A Little Help needed climbing 3-5 steps with a railing? : Total 6 Click Score: 16    End of Session Equipment Utilized During Treatment: Gait belt Activity Tolerance: Patient tolerated treatment well Patient left: in chair;with call bell/phone within reach;with chair alarm set Nurse Communication: Mobility status PT Visit Diagnosis: Unsteadiness on feet (R26.81);Muscle weakness (generalized) (M62.81);Pain;Difficulty in walking, not elsewhere classified (R26.2) Pain - Right/Left: Left Pain - part of body: Leg     Time: [id number] PT  Time Calculation (min) (ACUTE ONLY): 31 min  Charges:  $Gait Training: 8-22 mins                     Earney Navy, PTA Acute Rehabilitation Services Pager: [phone] Office: [phone]     Darliss Cheney 10/08/2019, 4:53 PM

## 2019-10-08 NOTE — Progress Notes (Addendum)
  Progress Note    10/08/2019 8:11 AM 5 Days Post-Op  Subjective: no complaints this morning.   Vitals:   10/08/19 0038 10/08/19 0514  BP: (!) 97/50 101/67  Pulse:  89  Resp: 14 16  Temp:  98.2 F (36.8 C)  SpO2:  99%   Physical Exam: Lungs:  Non labored Incisions:  L popliteal incision c/d/i and soft Extremities:  Palpable L DP pulse Neurologic: somewhat confused this morning but alert  CBC    Component Value Date/Time   WBC 5.2 10/08/2019 0219   RBC 2.85 (L) 10/08/2019 0219   HGB 8.6 (L) 10/08/2019 0219   HGB 14.1 09/29/2008 1038   HCT 28.1 (L) 10/08/2019 0219   HCT 41.7 09/29/2008 1038   PLT 201 10/08/2019 0219   PLT 134 (L) 09/29/2008 1038   MCV 98.6 10/08/2019 0219   MCV 89.9 09/29/2008 1038   MCH 30.2 10/08/2019 0219   MCHC 30.6 10/08/2019 0219   RDW 16.7 (H) 10/08/2019 0219   RDW 16.2 (H) 09/29/2008 1038   LYMPHSABS 1.3 10/01/2019 1533   LYMPHSABS 0.8 (L) 09/29/2008 1038   MONOABS 0.5 10/01/2019 1533   MONOABS 0.3 09/29/2008 1038   EOSABS 0.1 10/01/2019 1533   EOSABS 0.0 09/29/2008 1038   BASOSABS 0.0 10/01/2019 1533   BASOSABS 0.0 09/29/2008 1038    BMET    Component Value Date/Time   NA 141 10/04/2019 0328   K 4.1 10/04/2019 0328   CL 102 10/04/2019 0328   CO2 21 (L) 10/04/2019 0328   GLUCOSE 100 (H) 10/04/2019 0328   BUN 18 10/04/2019 0328   CREATININE 0.89 10/04/2019 0328   CALCIUM 8.2 (L) 10/04/2019 0328   GFRNONAA >60 10/04/2019 0328   GFRAA >60 10/04/2019 0328    INR    Component Value Date/Time   INR 1.3 (H) 10/08/2019 0219     Intake/Output Summary (Last 24 hours) at 10/08/2019 [id number] Last data filed at 10/08/2019 0700 Gross per 24 hour  Intake 697.01 ml  Output 1400 ml  Net -702.99 ml     Assessment/Plan:  74 y.o. female is s/p  thrombectomy LLE with subsequent calf hematoma evacuation 5 Days Post-Op   Perfusing L foot with palpable L DP IV heparin bridge to therapeutic INR; 5mg  coumadin daily Continue working with  therapy teams while in house Home when INR >2   Dagoberto Ligas, PA-C [facility] [phone] 10/08/2019 8:11 AM   I have independently interviewed and examined patient and agree with PA assessment and plan above.   Krystalynn Ridgeway C. Donzetta Matters, MD [facility] [place] Office: [phone] Pager: [phone]

## 2019-10-08 NOTE — Progress Notes (Addendum)
Patient sitting up in chair eating lunch. Patient's BP 81/64 (72) HR 82. Patient asymptomatic with no complaints. Patient's son at bedside. PA paged through [facility]. Will continue to monitor.

## 2019-10-08 NOTE — Plan of Care (Signed)
Continue to monitor

## 2019-10-09 DIAGNOSIS — I482 Chronic atrial fibrillation, unspecified: Secondary | ICD-10-CM

## 2019-10-09 DIAGNOSIS — I472 Ventricular tachycardia: Secondary | ICD-10-CM

## 2019-10-09 LAB — CBC
HCT: 29.1 % — ABNORMAL LOW (ref 36.0–46.0)
Hemoglobin: 9 g/dL — ABNORMAL LOW (ref 12.0–15.0)
MCH: 30.4 pg (ref 26.0–34.0)
MCHC: 30.9 g/dL (ref 30.0–36.0)
MCV: 98.3 fL (ref 80.0–100.0)
Platelets: 222 10*3/uL (ref 150–400)
RBC: 2.96 MIL/uL — ABNORMAL LOW (ref 3.87–5.11)
RDW: 17.2 % — ABNORMAL HIGH (ref 11.5–15.5)
WBC: 4.9 10*3/uL (ref 4.0–10.5)
nRBC: 0 % (ref 0.0–0.2)

## 2019-10-09 LAB — PROTIME-INR
INR: 1.3 — ABNORMAL HIGH (ref 0.8–1.2)
Prothrombin Time: 15.7 seconds — ABNORMAL HIGH (ref 11.4–15.2)

## 2019-10-09 LAB — HEPARIN LEVEL (UNFRACTIONATED): Heparin Unfractionated: 0.6 IU/mL (ref 0.30–0.70)

## 2019-10-09 MED ORDER — AMIODARONE HCL 200 MG PO TABS
200.0000 mg | ORAL_TABLET | Freq: Two times a day (BID) | ORAL | Status: DC
  Administered 2019-10-09 – 2019-10-13 (×9): 200 mg via ORAL
  Filled 2019-10-09 (×9): qty 1

## 2019-10-09 MED ORDER — WARFARIN SODIUM 7.5 MG PO TABS
7.5000 mg | ORAL_TABLET | Freq: Once | ORAL | Status: AC
  Administered 2019-10-09: 7.5 mg via ORAL
  Filled 2019-10-09: qty 1

## 2019-10-09 MED ORDER — WARFARIN - PHARMACIST DOSING INPATIENT: Freq: Every day | Status: DC

## 2019-10-09 NOTE — Progress Notes (Signed)
Occupational Therapy Treatment Patient Details Name: Angela [name] MRN: [id number] DOB: 10-Sep-1945 Today's Date: 10/09/2019    History of present illness 74 year old female with a history of A. fib (on Coumadin), DVT, nonischemic cardiomyopathy, rheumatic heart disease, sinus sick syndrome, CHF, who presented to the emergency department complaining of left lower extremity pain. This is been present for the last several days. She was seen in the ED a few days ago for chest pain. He also notes that she was complaining that her left leg was cold. Pt found to have thrombus left popliteal artery. She underwent left popliteal and tibial embolectomy 10/01/19.   OT comments  Patient agreeable to OT. Patient require min A with HOB elevated + increased time for bed mobility and mod cues for sequencing. Patient min A x2 hand held assist for functional transfer to recliner requiring max cues for sequencing and safety with patient stating "can I sit down?" before she is even turned with buttock facing the chair. Will continue to follow to maximize patient safety and independence with transfers/self care.  Follow Up Recommendations  Home health OT;Supervision/Assistance - 24 hour    Equipment Recommendations  3 in 1 bedside commode       Precautions / Restrictions Precautions Precautions: Fall Restrictions Weight Bearing Restrictions: No       Mobility Bed Mobility Overal bed mobility: Needs Assistance Bed Mobility: Supine to Sit     Supine to sit: HOB elevated;Min assist     General bed mobility comments: cues for sequencing, use of rail, assist to elevate trunk and increased time to scoot hips towards edge  Transfers Overall transfer level: Needs assistance Equipment used: 2 person hand held assist Transfers: Sit to/from Stand Sit to Stand: Min assist;+2 safety/equipment              Balance Overall balance assessment: Needs assistance Sitting-balance support: No upper extremity  supported;Feet supported Sitting balance-Leahy Scale: Fair Sitting balance - Comments: close min guard   Standing balance support: Bilateral upper extremity supported;During functional activity Standing balance-Leahy Scale: Poor Standing balance comment: reliant on external support                           ADL either performed or assessed with clinical judgement   ADL Overall ADL's : Needs assistance/impaired     Grooming: Wash/dry face;Set up;Sitting                   Toilet Transfer: Minimal assistance;+2 for safety/equipment;+2 for physical assistance;BSC;Cueing for safety;Cueing for sequencing(hand held assist) Toilet Transfer Details (indicate cue type and reason): simulated with recliner transfer, decreased safety awareness, leans heavily into therapist's and nursing assistant's hands with HHA         Functional mobility during ADLs: Minimal assistance;+2 for physical assistance;+2 for safety/equipment;Cueing for safety;Cueing for sequencing                 Cognition Arousal/Alertness: Awake/alert Behavior During Therapy: [facility] for tasks assessed/performed;Anxious Overall Cognitive Status: History of cognitive impairments - at baseline Area of Impairment: Following commands;Safety/judgement;Problem solving;Awareness                       Following Commands: Follows one step commands inconsistently;Follows one step commands with increased time Safety/Judgement: Decreased awareness of deficits;Decreased awareness of safety Awareness: Intellectual Problem Solving: Difficulty sequencing;Requires verbal cues;Requires tactile cues;Slow processing General Comments: pt asking "can I sit" before aligned with chair, requires cues for  encouragement/to progress mobility to complete transfer                   Pertinent Vitals/ Pain       Pain Assessment: No/denies pain         Frequency  Min 2X/week        Progress Toward Goals  OT  Goals(current goals can now be found in the care plan section)  Progress towards OT goals: Progressing toward goals  Acute Rehab OT Goals Patient Stated Goal: home OT Goal Formulation: With patient Time For Goal Achievement: 10/16/19 Potential to Achieve Goals: Good ADL Goals Pt Will Perform Grooming: with supervision;standing Pt Will Perform Lower Body Dressing: with supervision;sit to/from stand;with adaptive equipment Pt Will Transfer to Toilet: with supervision;ambulating;bedside commode Pt Will Perform Toileting - Clothing Manipulation and hygiene: with supervision;sit to/from stand Additional ADL Goal #1: Patient will follow multiple step commands during mobility and ADLs with increased time.  Plan Discharge plan remains appropriate       AM-PAC OT "6 Clicks" Daily Activity     Outcome Measure   Help from another person eating meals?: None Help from another person taking care of personal grooming?: A Little Help from another person toileting, which includes using toliet, bedpan, or urinal?: A Lot Help from another person bathing (including washing, rinsing, drying)?: A Lot Help from another person to put on and taking off regular upper body clothing?: A Little Help from another person to put on and taking off regular lower body clothing?: Total 6 Click Score: 15    End of Session    OT Visit Diagnosis: Other abnormalities of gait and mobility (R26.89);Muscle weakness (generalized) (M62.81);Other symptoms and signs involving cognitive function   Activity Tolerance Patient tolerated treatment well   Patient Left in chair;with call bell/phone within reach;with chair alarm set   Nurse Communication Mobility status        Time: [id number] OT Time Calculation (min): 15 min  Charges: OT General Charges $OT Visit: 1 Visit OT Treatments $Self Care/Home Management : 8-22 mins  [place] OT office: [place] 10/09/2019, 1:31 PM

## 2019-10-09 NOTE — Consult Note (Signed)
CARDIOLOGY CONSULT NOTE       Patient ID: Angela [name] MRN: [id number] DOB/AGE: [birth year] 74 y.o.  Admit date: 10/01/2019 Referring Physician: Oneida Alar Primary Physician: Lorene Dy, MD Primary Cardiologist: Lovena Le Reason for Consultation: Med Rx/ AFib/ VT  Active Problems:   VT (ventricular tachycardia) (HCC)   Sensation of cold in leg   Femoral artery thrombosis, left [facility])   HPI:  74 y.o. 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 History of cardiac arrest with ST Jude Bi V AICD. History of VT storm supposed to be maintained on amiodarone 200 mg daily but apparently not taking Afib is chronic on coumadin. Amiodarone for VT not afib. History of CVA, DVT and peripheral arterial emboli. Admitted 10/01/19 with subRx INR and embolic event to left leg .Had embolectomy to left popliteal by Dr Oneida Alar 10/01/19. No active CHF. Telemetry with underlying afib, V pacing and ventricular ectopy.  INR still subRx at 1.3 today Heparin level Rx 0.60  ? Not on ACE/ARB due to low BP Cr is normal   ROS All other systems reviewed and negative except as noted above  Past Medical History:  Diagnosis Date  . Abnormality of gait 08/23/2014  . Arthritis   . Atrial fibrillation ([place])    A.  Chronic Coumadin  . Cardiac arrest - ventricular fibrillation    A.  12/2007;  B. 01/2008 St. Jude Promote Bi-V ICD placed  . CVA (cerebral vascular accident) ([place])   . Diverticular disease   . DVT (deep venous thrombosis) ([place])   . 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 History of colon cancer    A.  1999 - T3, N1  chemotherapy  . Internal hemorrhoids   . Memory deficit 08/23/2014  . Nonischemic cardiomyopathy ([place])    a.  11/2001 - Cath - NL Cors; b. ECHO 09/22/11: EF 20%, MVR normal, moderate LAE, mild RAE c. ECHO (08/2012): ED 20%, diff HK, LA mod dilated, mild/mod TR, RV mild/mod decreased sys fx  . Rectal bleeding  04/28/2017  . Rheumatic heart disease    A. 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 Sick sinus syndrome ([place])    A.  s/p pacer in 1995.  B.    . Systolic CHF, chronic ([place])    A.  01/2008 Echo - EF 10-20%  . UTI (urinary tract infection) 01/19/2019    Family History  Problem Relation Age of Onset  . Diabetes Mother 27  . Coronary artery disease Mother        s/p cabg  . Lung cancer Father 9       smoker  . Coronary artery disease Sister        s/p cabg    Social History   Socioeconomic History  . Marital status: Married    Spouse name: Not on file  . Number of children: 1  . Years of education: Not on file  . Highest education level: Not on file  Occupational History  . Occupation: Retired    Fish farm manager: RETIRED  Tobacco Use  . Smoking status: Never Smoker  . Smokeless tobacco: Never Used  Substance and Sexual Activity  . Alcohol use: No  . Drug use: No  . Sexual activity: Not Currently  Other Topics Concern  . Not on file  Social History Narrative   The patient is married and has 1 child.  Lives in [place]  with husband.  She is retired from working at [employer].  She does not smoke or drink   Social Determinants of Health   Financial Resource Strain:   . Difficulty of Paying Living Expenses: Not on file  Food Insecurity:   . Worried About Charity fundraiser in the Last Year: Not on file  . Ran Out of Food in the Last Year: Not on file  Transportation Needs:   . Lack of Transportation (Medical): Not on file  . Lack of Transportation (Non-Medical): Not on file  Physical Activity:   . Days of Exercise per Week: Not on file  . Minutes of Exercise per Session: Not on file  Stress:   . Feeling of Stress : Not on file  Social Connections:   . Frequency of Communication with Friends and Family: Not on file  . Frequency of Social Gatherings with Friends and Family: Not on file  . Attends Religious Services: Not on file  . Active Member of Clubs or Organizations: Not on  file  . Attends Archivist Meetings: Not on file  . Marital Status: Not on file  Intimate Partner Violence:   . Fear of Current or Ex-Partner: Not on file  . Emotionally Abused: Not on file  . Physically Abused: Not on file  . Sexually Abused: Not on file    Past Surgical History:  Procedure Laterality Date  . ABDOMINAL HYSTERECTOMY    . AV NODE ABLATION N/A 09/23/2011   Procedure: AV NODE ABLATION;  Surgeon: Evans Lance, MD;  Location: [facility] CATH LAB;  Service: Cardiovascular;  Laterality: N/A;  . BIV ICD GENERTAOR CHANGE OUT N/A 09/05/2014   Procedure: BIV ICD GENERTAOR CHANGE OUT;  Surgeon: Evans Lance, MD;  Location[facility] CATH LAB;  Service: Cardiovascular;  Laterality: N/A;  . Biventricular AICD    . COLON SURGERY  1999  . EMBOLECTOMY Left 10/01/2019   Procedure: Left Popliteal Artery Thrombectomy with Vein Patch Angioplasty; Left Tibial Artery Thrombectomy;  Surgeon: Elam Dutch, MD;  Location: [facility] OR;  Service: Vascular;  Laterality: Left;  . History of echocardiogram  2003, 2007, 2009  . I & D EXTREMITY Left 10/03/2019   Procedure: EVACUATION OF HEMATOMA  LEFT LEG;  Surgeon: Elam Dutch, MD;  Location: [place];  Service: Vascular;  Laterality: Left;  . INCISION AND DRAINAGE PERIRECTAL ABSCESS Right 11/28/2018   Procedure: IRRIGATION AND DEBRIDEMENT PERIRECTAL ABSCESS;  Surgeon: Greer Pickerel, MD;  Location: [place];  Service: General;  Laterality: Right;  . Mitral Valve Replacement, Bjork-Shiley valve  1983      Current Facility-Administered Medications:  .  0.9 %  sodium chloride infusion, 500 mL, Intravenous, Once PRN, Elam Dutch, MD .  acetaminophen (TYLENOL) tablet 325-650 mg, 325-650 mg, Oral, Q4H PRN, 650 mg at 10/08/19 0608 **OR** acetaminophen (TYLENOL) suppository 325-650 mg, 325-650 mg, Rectal, Q4H PRN, Fields, Charles E, MD .  alum & mag hydroxide-simeth (MAALOX/MYLANTA) 200-200-20 MG/5ML suspension 15-30 mL, 15-30 mL, Oral, Q2H PRN, Fields, Charles  E, MD .  amiodarone (PACERONE) tablet 200 mg, 200 mg, Oral, BID, Josue Hector, MD .  bisacodyl (DULCOLAX) EC tablet 5 mg, 5 mg, Oral, Daily PRN, Elam Dutch, MD, 5 mg at 10/08/19 [phone] .  carvedilol (COREG) tablet 3.125 mg, 3.125 mg, Oral, BID WC, Elam Dutch, MD, 3.125 mg at 10/09/19 1104 .  Chlorhexidine Gluconate Cloth 2 % PADS 6 each, 6 each, Topical, Daily, Fields, Jessy Oto, MD,  6 each at 10/09/19 0925 .  digoxin (LANOXIN) tablet 125 mcg, 125 mcg, Oral, Daily, Elam Dutch, MD, 125 mcg at 10/09/19 0920 .  docusate sodium (COLACE) capsule 100 mg, 100 mg, Oral, Daily, Fields, Charles E, MD, 100 mg at 10/09/19 0919 .  furosemide (LASIX) tablet 20 mg, 20 mg, Oral, Daily, Elam Dutch, MD, 20 mg at 10/09/19 0919 .  guaiFENesin-dextromethorphan (ROBITUSSIN DM) 100-10 MG/5ML syrup 15 mL, 15 mL, Oral, Q4H PRN, Fields, Charles E, MD .  heparin ADULT infusion 100 units/mL (25000 units/229mL sodium chloride 0.45%), 850 Units/hr, Intravenous, Continuous, Skeet Simmer, [facility], Last Rate: 8.5 mL/hr at 10/08/19 1928, 850 Units/hr at 10/08/19 1928 .  hydrALAZINE (APRESOLINE) injection 5 mg, 5 mg, Intravenous, Q20 Min PRN, Fields, Charles E, MD .  HYDROmorphone (DILAUDID) injection 0.5-1 mg, 0.5-1 mg, Intravenous, Q2H PRN, Elam Dutch, MD, 1 mg at 10/07/19 0339 .  labetalol (NORMODYNE) injection 10 mg, 10 mg, Intravenous, Q10 min PRN, Fields, Charles E, MD .  magnesium sulfate IVPB 2 g 50 mL, 2 g, Intravenous, Daily PRN, Fields, Charles E, MD .  metoprolol tartrate (LOPRESSOR) injection 2-5 mg, 2-5 mg, Intravenous, Q2H PRN, Fields, Charles E, MD .  midodrine (PROAMATINE) tablet 10 mg, 10 mg, Oral, BID WC, Elam Dutch, MD, 10 mg at 10/09/19 0920 .  ondansetron (ZOFRAN) injection 4 mg, 4 mg, Intravenous, Q6H PRN, Fields, Charles E, MD .  oxyCODONE (Oxy IR/ROXICODONE) immediate release tablet 5-10 mg, 5-10 mg, Oral, Q4H PRN, Fields, Charles E, MD .  pantoprazole (PROTONIX) EC  tablet 40 mg, 40 mg, Oral, Daily, Elam Dutch, MD, 40 mg at 10/09/19 0919 .  phenol (CHLORASEPTIC) mouth spray 1 spray, 1 spray, Mouth/Throat, PRN, Fields, Charles E, MD .  polyethylene glycol (MIRALAX / GLYCOLAX) packet 17 g, 17 g, Oral, Daily, Laurence Slate M, PA-C, 17 g at 10/09/19 0920 .  potassium chloride SA (KLOR-CON) CR tablet 20-40 mEq, 20-40 mEq, Oral, Daily PRN, Fields, Jessy Oto, MD .  senna-docusate (Senokot-S) tablet 1 tablet, 1 tablet, Oral, QHS PRN, Elam Dutch, MD, 1 tablet at 10/08/19 [phone] .  tiZANidine (ZANAFLEX) tablet 4 mg, 4 mg, Oral, Daily PRN, Elam Dutch, MD, 4 mg at 10/06/19 2347 .  warfarin (COUMADIN) tablet 5 mg, 5 mg, Oral, q1800, Dagoberto Ligas, PA-C, 5 mg at 10/08/19 1717 .  Warfarin - Physician Dosing Inpatient, , Does not apply, q1800, Elam Dutch, MD, Given at 10/07/19 1657 . amiodarone  200 mg Oral BID  . carvedilol  3.125 mg Oral BID WC  . Chlorhexidine Gluconate Cloth  6 each Topical Daily  . digoxin  125 mcg Oral Daily  . docusate sodium  100 mg Oral Daily  . furosemide  20 mg Oral Daily  . midodrine  10 mg Oral BID WC  . pantoprazole  40 mg Oral Daily  . polyethylene glycol  17 g Oral Daily  . warfarin  5 mg Oral q1800  . Warfarin - Physician Dosing Inpatient   Does not apply q1800   . sodium chloride    . heparin 850 Units/hr (10/08/19 1928)  . magnesium sulfate bolus IVPB      Physical Exam: Blood pressure 107/89, pulse 88, temperature 97.9 F (36.6 C), temperature source Oral, resp. rate 17, height 5\' 6"  (1.676 m), weight 60.2 kg, SpO2 96 %.   Affect appropriate Elderly malnourished black female  HEENT: normal Neck supple with no adenopathy JVP normal no bruits no thyromegaly Lungs clear  with no wheezing and good diaphragmatic motion Heart:  S1/S2 SEM  murmur, no rub, gallop or click PMI enlarged AICD under left clavicle  Abdomen: benighn, BS positve, no tenderness, no AAA no bruit.  No HSM or HJR Post left leg  embolectomy with warm left foot  No edema Neuro non-focal Skin warm and dry No muscular weakness   Labs:   Lab Results  Component Value Date   WBC 4.9 10/09/2019   HGB 9.0 (L) 10/09/2019   HCT 29.1 (L) 10/09/2019   MCV 98.3 10/09/2019   PLT 222 10/09/2019    Recent Labs  Lab 10/04/19 0328  NA 141  K 4.1  CL 102  CO2 21*  BUN 18  CREATININE 0.89  CALCIUM 8.2*  GLUCOSE 100*   Lab Results  Component Value Date   CKTOTAL 95 09/22/2011   CKMB 2.6 09/22/2011   TROPONINI 0.21 (HH) 01/22/2019   No results found for: CHOL No results found for: HDL No results found for: LDLCALC No results found for: TRIG No results found for: CHOLHDL No results found for: LDLDIRECT    Radiology: [facility] Chest Port 1 View  Result Date: 09/29/2019 CLINICAL DATA:  Chest pain and ventricular tachycardia EXAM: PORTABLE CHEST 1 VIEW COMPARISON:  01/22/2019 FINDINGS: Cardiac shadow remains enlarged. Postsurgical changes as well as a defibrillator are again seen and stable. No focal infiltrate or sizable effusion is seen. No bony abnormality is noted. IMPRESSION: No acute abnormality noted. Electronically Signed   By: Inez Catalina M.D.   On: 09/29/2019 02:07   VAS [place] LOWER EXTREMITY VENOUS (DVT) (ONLY [facility] & [facility])  Result Date: 10/03/2019  Lower Venous DVTStudy Indications: Pain.  Risk Factors: None identified. Limitations: Poor ultrasound/tissue interface and patient positioning, patient immobility, patient pain tolerance. Comparison Study: No prior studies. Performing Technologist: Oliver Hum RVT  Examination Guidelines: A complete evaluation includes B-mode imaging, spectral Doppler, color Doppler, and power Doppler as needed of all accessible portions of each vessel. Bilateral testing is considered an integral part of a complete examination. Limited examinations for reoccurring indications may be performed as noted. The reflux portion of the exam is performed with the patient in reverse Trendelenburg.   +-----+---------------+---------+-----------+----------+--------------+ RIGHTCompressibilityPhasicitySpontaneityPropertiesThrombus Aging +-----+---------------+---------+-----------+----------+--------------+ CFV  Full           Yes      Yes                                 +-----+---------------+---------+-----------+----------+--------------+   +---------+---------------+---------+-----------+----------+--------------+ LEFT     CompressibilityPhasicitySpontaneityPropertiesThrombus Aging +---------+---------------+---------+-----------+----------+--------------+ CFV      Full           Yes      Yes                                 +---------+---------------+---------+-----------+----------+--------------+ SFJ      Full                                                        +---------+---------------+---------+-----------+----------+--------------+ FV Prox  Full                                                        +---------+---------------+---------+-----------+----------+--------------+  FV Mid   Full                                                        +---------+---------------+---------+-----------+----------+--------------+ FV DistalFull                                                        +---------+---------------+---------+-----------+----------+--------------+ PFV      Full                                                        +---------+---------------+---------+-----------+----------+--------------+ POP      Full           Yes      Yes                                 +---------+---------------+---------+-----------+----------+--------------+ PTV      Full                                                        +---------+---------------+---------+-----------+----------+--------------+ PERO     Full                                                         +---------+---------------+---------+-----------+----------+--------------+ Arterial waveforms were absent in the popliteal, posterior tibial, peroneal, and anterior tibial arteries.    Summary: RIGHT: - No evidence of deep vein thrombosis in the lower extremity. No indirect evidence of obstruction proximal to the inguinal ligament.  LEFT: - There is no evidence of deep vein thrombosis in the lower extremity. However, portions of this examination were limited- see technologist comments above.  - Arterial waveforms were absent in the popliteal, posterior tibial, peroneal, and anterior tibial arteries.  *See table(s) above for measurements and observations. Electronically signed by Ruta Hinds MD on 10/03/2019 at 11:34:41 AM.    Final     EKG: 09/29/19 V pacing artifact    ASSESSMENT AND PLAN:   1. VT:  Post AICD ST Jude no recent d/c but placed on amiodarone previously for multiple d/c's and VT storm Will reload with 200 mg bid for two weeks then cut back to 200 mg daily Telemetry now with just isolated ectopy  2. Chronic AFib: admitted with subRx MI and embolic event Coumadin per pharmacy continue heparin until INR > 2.2 for 24 hours Rate control is fine   3. CHF:  euvolemic to dry/ malnourished continue coreg dig and lasix   4. PV:  Post left popliteal embolectomy left foot warm with pulse per [facility]VS   Signed: Jenkins Rouge 10/09/2019, 11:10 AM

## 2019-10-09 NOTE — Progress Notes (Addendum)
  Progress Note    10/09/2019 9:53 AM 6 Days Post-Op  Subjective:  No complaints  Afebrile HR 70's-80's  [id number] systolic 0000000 RA  Vitals:   10/09/19 0753 10/09/19 0920  BP: (!) 95/55   Pulse: 78 76  Resp: 17   Temp: 97.9 F (36.6 C)   SpO2: 96%     Physical Exam: Cardiac:  regular Lungs:  Non labored Incisions:  Clean and dry - healing nicely.  Extremities:  Easily palpable left DP/PT; motor and sensory are in tact.    CBC    Component Value Date/Time   WBC 4.9 10/09/2019 0316   RBC 2.96 (L) 10/09/2019 0316   HGB 9.0 (L) 10/09/2019 0316   HGB 14.1 09/29/2008 1038   HCT 29.1 (L) 10/09/2019 0316   HCT 41.7 09/29/2008 1038   PLT 222 10/09/2019 0316   PLT 134 (L) 09/29/2008 1038   MCV 98.3 10/09/2019 0316   MCV 89.9 09/29/2008 1038   MCH 30.4 10/09/2019 0316   MCHC 30.9 10/09/2019 0316   RDW 17.2 (H) 10/09/2019 0316   RDW 16.2 (H) 09/29/2008 1038   LYMPHSABS 1.3 10/01/2019 1533   LYMPHSABS 0.8 (L) 09/29/2008 1038   MONOABS 0.5 10/01/2019 1533   MONOABS 0.3 09/29/2008 1038   EOSABS 0.1 10/01/2019 1533   EOSABS 0.0 09/29/2008 1038   BASOSABS 0.0 10/01/2019 1533   BASOSABS 0.0 09/29/2008 1038    BMET    Component Value Date/Time   NA 141 10/04/2019 0328   K 4.1 10/04/2019 0328   CL 102 10/04/2019 0328   CO2 21 (L) 10/04/2019 0328   GLUCOSE 100 (H) 10/04/2019 0328   BUN 18 10/04/2019 0328   CREATININE 0.89 10/04/2019 0328   CALCIUM 8.2 (L) 10/04/2019 0328   GFRNONAA >60 10/04/2019 0328   GFRAA >60 10/04/2019 0328    INR    Component Value Date/Time   INR 1.3 (H) 10/09/2019 0316     Intake/Output Summary (Last 24 hours) at 10/09/2019 0953 Last data filed at 10/09/2019 0351 Gross per 24 hour  Intake --  Output 1390 ml  Net -1390 ml     Assessment:  74 y.o. female is s/p:  thrombectomy LLE with subsequent calf hematoma evacuation  6 Days Post-Op  Plan: -pt with palpable left DP/PT -pt's home meds had amiodarone listed as medication  but she has not been taking this.  She was last seen by cardiology in July and it was documented to continue this.  Will ask them to see pt to see if she needs this.  On d/c med rec, says pt has not taken in greater than 30 days. -DVT prophylaxis:  Heparin to coumadin bridge.  Her INR is not budging.  Will ask pharmacy to dose coumadin.  She may need higher dose if she does not need amio, but if this is restarted, may need to continue current dose.  -home when INR is 2.0 or greater   Leontine Locket, PA-C [facility] [phone] 10/09/2019 9:53 AM   I have seen and evaluated the patient. I agree with the PA note as documented above. S/P left leg thrombectomy.  Palpable left DP/PT.  Will sort out with cardiology if she needs amiodarone - patient unsure.  INR remains unchanged - will ask pharmacy to assist.   Marty Heck, MD [facility]: [phone]

## 2019-10-09 NOTE — Progress Notes (Signed)
[place] for heparin and warfarin Indication: L-leg embolus/AFib  No Known Allergies  Patient Measurements: Height: 5\' 6"  (167.6 cm) Weight: 132 lb 11.5 oz (60.2 kg) IBW/kg (Calculated) : 59.3 Heparin Dosing Weight: 57 kg  Vital Signs: Temp: 97.9 F (36.6 C) (02/13 0753) Temp Source: Oral (02/13 0753) BP: 107/89 (02/13 1104) Pulse Rate: 88 (02/13 1104)  Labs: Recent Labs    10/07/19 0321 10/07/19 0321 10/08/19 0219 10/09/19 0316  HGB 8.6*   < > 8.6* 9.0*  HCT 27.4*  --  28.1* 29.1*  PLT 175  --  201 222  LABPROT 16.6*  --  16.2* 15.7*  INR 1.4*  --  1.3* 1.3*  HEPARINUNFRC 0.74*  --  0.54 0.60   < > = values in this interval not displayed.    Estimated Creatinine Clearance: 51.1 mL/min (by C-G formula based on SCr of 0.89 mg/dL).  Assessment: 71 YOF presenting with acute leg pain, with left leg embolus and s/p embolectomy 2/5 pm.  On warfarin PTA for AFib, INR subtherapeutic at 1.8 on admission. Of note, patient was supposed to be taking amiodarone PTA for VT per cards. This has now been restarted on 2/13. Patient also has history of DVT and peripheral arterial emboli. She is s/p left popliteal embolectomy. Pharmacy now to dose both heparin and warfarin.   PTA warfarin: 4 mg daily except 5 mg on Tuesdays and Thursdays (30 mg/week)  Heparin level this morning is therapeutic at 0.60 on 850 units/hour. Her INR remains subtherapeutic at 1.3. H&H is low but stable at 9/29.1, plts are wnl at 222. No new BMP since 2/8 but renal function was stable around 0.8-0.9 then. Of note, the newly resumed amiodarone may increase the anticoagulant effect of warfarin.   Goal of Therapy:  Monitor platelets by anticoagulation protocol: Yes  Heparin level goal 0.3-0.7 but aim for lower end due to recent bleeding INR 2-3   Plan:  - Continue heparin drip at 850 units/hr - Warfarin 7.5 mg x 1 tonight  - Daily heparin level, CBC and PT/INR - Per  cardiology, continue heparin until INR > 2.2 x 24 hours    Thank you,   Eddie Candle, PharmD PGY-1 Pharmacy Resident   Please check amion for clinical pharmacist contact number

## 2019-10-09 NOTE — Plan of Care (Signed)
  Problem: Education: Goal: Knowledge of General Education information will improve Description: Including pain rating scale, medication(s)/side effects and non-pharmacologic comfort measures Outcome: Not Progressing   

## 2019-10-10 LAB — BASIC METABOLIC PANEL
Anion gap: 8 (ref 5–15)
BUN: 16 mg/dL (ref 8–23)
CO2: 29 mmol/L (ref 22–32)
Calcium: 9.1 mg/dL (ref 8.9–10.3)
Chloride: 101 mmol/L (ref 98–111)
Creatinine, Ser: 0.79 mg/dL (ref 0.44–1.00)
GFR calc Af Amer: >60 mL/min (ref >60)
GFR calc non Af Amer: >60 mL/min (ref >60)
Glucose, Bld: 107 mg/dL — ABNORMAL HIGH (ref 70–99)
Potassium: 3.2 mmol/L — ABNORMAL LOW (ref 3.5–5.1)
Sodium: 138 mmol/L (ref 135–145)

## 2019-10-10 LAB — CBC
HCT: 29.4 % — ABNORMAL LOW (ref 36.0–46.0)
Hemoglobin: 9 g/dL — ABNORMAL LOW (ref 12.0–15.0)
MCH: 30 pg (ref 26.0–34.0)
MCHC: 30.6 g/dL (ref 30.0–36.0)
MCV: 98 fL (ref 80.0–100.0)
Platelets: 223 10*3/uL (ref 150–400)
RBC: 3 MIL/uL — ABNORMAL LOW (ref 3.87–5.11)
RDW: 17.2 % — ABNORMAL HIGH (ref 11.5–15.5)
WBC: 4.7 10*3/uL (ref 4.0–10.5)
nRBC: 0 % (ref 0.0–0.2)

## 2019-10-10 LAB — PROTIME-INR
INR: 1.4 — ABNORMAL HIGH (ref 0.8–1.2)
Prothrombin Time: 17.2 seconds — ABNORMAL HIGH (ref 11.4–15.2)

## 2019-10-10 LAB — HEPARIN LEVEL (UNFRACTIONATED): Heparin Unfractionated: 0.42 IU/mL (ref 0.30–0.70)

## 2019-10-10 MED ORDER — WARFARIN SODIUM 7.5 MG PO TABS
7.5000 mg | ORAL_TABLET | Freq: Once | ORAL | Status: AC
  Administered 2019-10-10: 7.5 mg via ORAL
  Filled 2019-10-10: qty 1

## 2019-10-10 NOTE — Progress Notes (Signed)
[place] for heparin and warfarin Indication: L-leg embolus/AFib  No Known Allergies  Patient Measurements: Height: 5\' 6"  (167.6 cm) Weight: 132 lb 0.9 oz (59.9 kg) IBW/kg (Calculated) : 59.3 Heparin Dosing Weight: 57 kg  Vital Signs: Temp: 97.5 F (36.4 C) (02/14 0444) Temp Source: Axillary (02/14 0444) BP: 109/66 (02/14 0444) Pulse Rate: 76 (02/13 2337)  Labs: Recent Labs    10/08/19 0219 10/08/19 0219 10/09/19 0316 10/10/19 0300  HGB 8.6*   < > 9.0* 9.0*  HCT 28.1*  --  29.1* 29.4*  PLT 201  --  222 223  LABPROT 16.2*  --  15.7* 17.2*  INR 1.3*  --  1.3* 1.4*  HEPARINUNFRC 0.54  --  0.60 0.42  CREATININE  --   --   --  0.79   < > = values in this interval not displayed.    Estimated Creatinine Clearance: 56.9 mL/min (by C-G formula based on SCr of 0.79 mg/dL).  Assessment: 72 YOF presenting with acute leg pain, with left leg embolus and s/p embolectomy 2/5 pm.  On warfarin PTA for AFib, INR subtherapeutic at 1.8 on admission. Of note, patient was supposed to be taking amiodarone PTA for VT per cards. This has now been restarted on 2/13. Patient also has history of DVT and peripheral arterial emboli. She is s/p left popliteal embolectomy. Pharmacy now to dose both heparin and warfarin.   PTA warfarin: 4 mg daily except 5 mg on Tuesdays and Thursdays (30 mg/week)  Heparin level this morning is therapeutic at 0.42 on 850 units/hour. Her INR remains subtherapeutic at 1.4. H&H is low but stable at 9/29.4, plts are wnl at 223. Renal function remains stable around 0.8-0.9.  Of note, the newly resumed amiodarone may increase the anticoagulant effect of warfarin but effect is likely seen within the first week of initiation.   Goal of Therapy:  Monitor platelets by anticoagulation protocol: Yes  Heparin level goal 0.3-0.7 but aim for lower end due to recent bleeding INR 2-3   Plan:  - Continue heparin drip at 850 units/hr - Warfarin  7.5 mg x 1 tonight  - Daily heparin level, CBC and PT/INR - Per cardiology, continue heparin until INR > 2.2 x 24 hours    Thank you,   Eddie Candle, PharmD PGY-1 Pharmacy Resident   Please check amion for clinical pharmacist contact number

## 2019-10-10 NOTE — Progress Notes (Signed)
   Primary Cardiologist:  Lovena Le   Subjective:  Denies SSCP, palpitations or Dyspnea   Objective:  Vitals:   10/09/19 1707 10/09/19 1940 10/09/19 2337 10/10/19 0444  BP: 95/60 (!) 124/53 (!) 103/54 109/66  Pulse: 80 75 76   Resp: 14  18   Temp: 98 F (36.7 C) (!) 97.4 F (36.3 C) 98 F (36.7 C) (!) 97.5 F (36.4 C)  TempSrc: Oral Oral Oral Axillary  SpO2: 100% 100% 98% 100%  Weight:    59.9 kg  Height:        Intake/Output from previous day:  Intake/Output Summary (Last 24 hours) at 10/10/2019 0805 Last data filed at 10/10/2019 0400 Gross per 24 hour  Intake 487.6 ml  Output 100 ml  Net 387.6 ml    Physical Exam: Affect appropriate Chronically ill thin black female  HEENT: normal Neck supple with no adenopathy JVP normal no bruits no thyromegaly Lungs clear with no wheezing and good diaphragmatic motion Heart:  S1/S2 no murmur, no rub, gallop or click PMI enlarged AICD under left clavicle  Abdomen: benighn, BS positve, no tenderness, no AAA no bruit.  No HSM or HJR Post left popliteal embolectomy warm left foot  No edema Neuro non-focal Skin warm and dry No muscular weakness   Lab Results: Basic Metabolic Panel: Recent Labs    10/10/19 0300  NA 138  K 3.2*  CL 101  CO2 29  GLUCOSE 107*  BUN 16  CREATININE 0.79  CALCIUM 9.1   Liver Function Tests: No results for input(s): AST, ALT, ALKPHOS, BILITOT, PROT, ALBUMIN in the last 72 hours. No results for input(s): LIPASE, AMYLASE in the last 72 hours. CBC: Recent Labs    10/09/19 0316 10/10/19 0300  WBC 4.9 4.7  HGB 9.0* 9.0*  HCT 29.1* 29.4*  MCV 98.3 98.0  PLT 222 223    Imaging: No results found.  Cardiac Studies:  ECG: Afib V pacing    Telemetry: Afib V pacing PVC;s   Echo: 10/27/18 EF 15-20%  Medications:   . amiodarone  200 mg Oral BID  . carvedilol  3.125 mg Oral BID WC  . Chlorhexidine Gluconate Cloth  6 each Topical Daily  . digoxin  125 mcg Oral Daily  . docusate sodium   100 mg Oral Daily  . furosemide  20 mg Oral Daily  . midodrine  10 mg Oral BID WC  . pantoprazole  40 mg Oral Daily  . polyethylene glycol  17 g Oral Daily  . Warfarin - Pharmacist Dosing Inpatient   Does not apply q1800     . sodium chloride    . heparin 850 Units/hr (10/10/19 0400)  . magnesium sulfate bolus IVPB      Assessment/Plan:   1. 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 Afib : rate control is fine with back up V pacing admitted with LE emboli left popliteal due to subRx INR coumadin restarted INR slow to rise 1.4 today 3. Chronic Systolic CHF:  euovolemic appearing on coreg dig and lasix ? Why not on ACE/ARB   Jenkins Rouge 10/10/2019, 8:05 AM

## 2019-10-10 NOTE — Progress Notes (Addendum)
  Progress Note    10/10/2019 10:54 AM 7 Days Post-Op  Subjective:  No complaints.  Wants to go home  Afebrile HR 70's-80's 90's-100's  99% RA  Vitals:   10/10/19 1000 10/10/19 1006  BP:    Pulse: 83 85  Resp:    Temp:    SpO2:      Physical Exam: Cardiac:  regular Lungs:  Non labored Incisions:  Left groin looks fine Extremities:  Palpable left pedal pulses.    CBC    Component Value Date/Time   WBC 4.7 10/10/2019 0300   RBC 3.00 (L) 10/10/2019 0300   HGB 9.0 (L) 10/10/2019 0300   HGB 14.1 09/29/2008 1038   HCT 29.4 (L) 10/10/2019 0300   HCT 41.7 09/29/2008 1038   PLT 223 10/10/2019 0300   PLT 134 (L) 09/29/2008 1038   MCV 98.0 10/10/2019 0300   MCV 89.9 09/29/2008 1038   MCH 30.0 10/10/2019 0300   MCHC 30.6 10/10/2019 0300   RDW 17.2 (H) 10/10/2019 0300   RDW 16.2 (H) 09/29/2008 1038   LYMPHSABS 1.3 10/01/2019 1533   LYMPHSABS 0.8 (L) 09/29/2008 1038   MONOABS 0.5 10/01/2019 1533   MONOABS 0.3 09/29/2008 1038   EOSABS 0.1 10/01/2019 1533   EOSABS 0.0 09/29/2008 1038   BASOSABS 0.0 10/01/2019 1533   BASOSABS 0.0 09/29/2008 1038    BMET    Component Value Date/Time   NA 138 10/10/2019 0300   K 3.2 (L) 10/10/2019 0300   CL 101 10/10/2019 0300   CO2 29 10/10/2019 0300   GLUCOSE 107 (H) 10/10/2019 0300   BUN 16 10/10/2019 0300   CREATININE 0.79 10/10/2019 0300   CALCIUM 9.1 10/10/2019 0300   GFRNONAA >60 10/10/2019 0300   GFRAA >60 10/10/2019 0300    INR    Component Value Date/Time   INR 1.4 (H) 10/10/2019 0300     Intake/Output Summary (Last 24 hours) at 10/10/2019 1054 Last data filed at 10/10/2019 0400 Gross per 24 hour  Intake 487.6 ml  Output 100 ml  Net 387.6 ml     Assessment:  74 y.o. female is s/p:  thrombectomy LLE with subsequent calf hematoma evacuation  7 Days Post-Op  Plan: -pedal pulse remain palpable.  -appreciate cardiology seeing pt and evaluating meds.  Amiodarone restarted 200mg  bid for 2 weeks then back to  200mg  daily.  -DVT prophylaxis:  Heparin to coumadin bridge.  Appreciate pharmacy dosing.  She had increased dose of 7.5mg  yesterday and INR is 1.4 today.   -home when INR is therapeutic.    Leontine Locket, PA-C [facility] [phone] 10/10/2019 10:54 AM  I have seen and evaluated the patient. I agree with the PA note as documented above.  Palpable left pedal pulse.  Appreciate cardiology evaluation and amiodarone restarted.  Coumadin dose increased and appreciate pharmacy dosing.  INR 1.4 today.  Awaiting therapeutic INR.  Marty Heck, MD [facility] [place] Office: [phone]

## 2019-10-11 DIAGNOSIS — I7090 Unspecified atherosclerosis: Secondary | ICD-10-CM

## 2019-10-11 DIAGNOSIS — R42 Dizziness and giddiness: Secondary | ICD-10-CM

## 2019-10-11 DIAGNOSIS — I5042 Chronic combined systolic (congestive) and diastolic (congestive) heart failure: Secondary | ICD-10-CM

## 2019-10-11 LAB — CBC
HCT: 29.8 % — ABNORMAL LOW (ref 36.0–46.0)
Hemoglobin: 9.3 g/dL — ABNORMAL LOW (ref 12.0–15.0)
MCH: 30.4 pg (ref 26.0–34.0)
MCHC: 31.2 g/dL (ref 30.0–36.0)
MCV: 97.4 fL (ref 80.0–100.0)
Platelets: 193 10*3/uL (ref 150–400)
RBC: 3.06 MIL/uL — ABNORMAL LOW (ref 3.87–5.11)
RDW: 17.2 % — ABNORMAL HIGH (ref 11.5–15.5)
WBC: 4.2 10*3/uL (ref 4.0–10.5)
nRBC: 0 % (ref 0.0–0.2)

## 2019-10-11 LAB — PROTIME-INR
INR: 1.5 — ABNORMAL HIGH (ref 0.8–1.2)
Prothrombin Time: 18 seconds — ABNORMAL HIGH (ref 11.4–15.2)

## 2019-10-11 LAB — HEPARIN LEVEL (UNFRACTIONATED): Heparin Unfractionated: 0.67 IU/mL (ref 0.30–0.70)

## 2019-10-11 MED ORDER — DICLOFENAC SODIUM 1 % EX GEL
2.0000 g | Freq: Four times a day (QID) | CUTANEOUS | Status: DC
  Administered 2019-10-11 – 2019-10-13 (×2): 2 g via TOPICAL
  Filled 2019-10-11: qty 100

## 2019-10-11 MED ORDER — WARFARIN SODIUM 7.5 MG PO TABS
7.5000 mg | ORAL_TABLET | Freq: Once | ORAL | Status: AC
  Administered 2019-10-11: 7.5 mg via ORAL
  Filled 2019-10-11: qty 1

## 2019-10-11 MED ORDER — MIDODRINE HCL 5 MG PO TABS
10.0000 mg | ORAL_TABLET | Freq: Three times a day (TID) | ORAL | Status: DC
  Administered 2019-10-11 – 2019-10-13 (×7): 10 mg via ORAL
  Filled 2019-10-11 (×8): qty 2

## 2019-10-11 NOTE — Progress Notes (Signed)
Physical Therapy Treatment Patient Details Name: Angela [name] MRN: [id number] DOB: December 10, 1945 Today's Date: 10/11/2019    History of Present Illness 74 year old female with a history of A. fib (on Coumadin), DVT, nonischemic cardiomyopathy, rheumatic heart disease, sinus sick syndrome, CHF, who presented to the emergency department complaining of left lower extremity pain. This is been present for the last several days. She was seen in the ED a few days ago for chest pain. He also notes that she was complaining that her left leg was cold. Pt found to have thrombus left popliteal artery. She underwent left popliteal and tibial embolectomy 10/01/19.    PT Comments    Patient seen for mobility progression. This session focused on gait training without AD as pt has difficulty managing RW. Pt requires min A +2 or mod A +1 for balance. PT will continue to follow and progress as tolerated.     Follow Up Recommendations  Supervision/Assistance - 24 hour;Home health PT     Equipment Recommendations  3in1 (PT)    Recommendations for Other Services       Precautions / Restrictions Precautions Precautions: Fall Restrictions Weight Bearing Restrictions: No    Mobility  Bed Mobility Overal bed mobility: Needs Assistance Bed Mobility: Supine to Sit     Supine to sit: HOB elevated;Min assist Sit to supine: Min assist   General bed mobility comments: use of rail and increased time; assist to elevate trunk into sitting  Transfers Overall transfer level: Needs assistance Equipment used: 1 person hand held assist Transfers: Sit to/from Stand Sit to Stand: Min assist;+2 safety/equipment Stand pivot transfers: Min assist       General transfer comment: overall pt requires MIN A +2 for sit<>stand from various surfaces. pt requires cues for hand placement during each trial.  Ambulation/Gait Ambulation/Gait assistance: Min assist;+2 safety/equipment;+2 physical assistance;Mod assist Gait  Distance (Feet): (100 ft with seated rest break) Assistive device: 2 person hand held assist;1 person hand held assist Gait Pattern/deviations: Step-to pattern;Decreased step length - right;Decreased stance time - left;Trunk flexed;Step-through pattern(maintains L LE IR) Gait velocity: decreased   General Gait Details: pt requires min A +2 or mod A +1 for balance; cues for upright posture and forward gaze; improved L LE extension during stance phase and increased weight bearing with distance   Stairs             Wheelchair Mobility    Modified Rankin (Stroke Patients Only)       Balance Overall balance assessment: Needs assistance Sitting-balance support: No upper extremity supported;Feet supported Sitting balance-Leahy Scale: Fair Sitting balance - Comments: close min guard   Standing balance support: Bilateral upper extremity supported;During functional activity Standing balance-Leahy Scale: Poor Standing balance comment: reliant on external support                            Cognition Arousal/Alertness: Awake/alert Behavior During Therapy: WFL for tasks assessed/performed;Anxious(fearful of falling ) Overall Cognitive Status: History of cognitive impairments - at baseline Area of Impairment: Following commands;Safety/judgement;Problem solving;Awareness                   Current Attention Level: Focused Memory: Decreased short-term memory Following Commands: Follows one step commands inconsistently;Follows one step commands with increased time Safety/Judgement: Decreased awareness of deficits;Decreased awareness of safety Awareness: Intellectual Problem Solving: Difficulty sequencing;Requires verbal cues;Requires tactile cues;Slow processing General Comments: Pt often asking to sit down, then stating she wanted to walk more,  often unsure of surroundings and task at hand      Exercises      General Comments        Pertinent Vitals/Pain Pain  Assessment: Faces Faces Pain Scale: Hurts a little bit Pain Location: LLE with mobility Pain Descriptors / Indicators: Discomfort;Guarding Pain Intervention(s): Limited activity within patient's tolerance;Monitored during session;Repositioned    Home Living                      Prior Function            PT Goals (current goals can now be found in the care plan section) Acute Rehab PT Goals Patient Stated Goal: home Progress towards PT goals: Progressing toward goals    Frequency    Min 3X/week      PT Plan Current plan remains appropriate    Co-evaluation PT/OT/SLP Co-Evaluation/Treatment: Yes Reason for Co-Treatment: To address functional/ADL transfers;For patient/therapist safety   OT goals addressed during session: ADL's and self-care      AM-PAC PT "6 Clicks" Mobility   Outcome Measure  Help needed turning from your back to your side while in a flat bed without using bedrails?: A Little Help needed moving from lying on your back to sitting on the side of a flat bed without using bedrails?: A Little Help needed moving to and from a bed to a chair (including a wheelchair)?: A Little Help needed standing up from a chair using your arms (e.g., wheelchair or bedside chair)?: A Little Help needed to walk in hospital room?: A Little Help needed climbing 3-5 steps with a railing? : A Lot 6 Click Score: 17    End of Session Equipment Utilized During Treatment: Gait belt Activity Tolerance: Patient tolerated treatment well Patient left: in chair;with call bell/phone within reach;with chair alarm set Nurse Communication: Mobility status PT Visit Diagnosis: Unsteadiness on feet (R26.81);Muscle weakness (generalized) (M62.81);Pain;Difficulty in walking, not elsewhere classified (R26.2) Pain - Right/Left: Left Pain - part of body: Leg     Time: [id number] PT Time Calculation (min) (ACUTE ONLY): 30 min  Charges:  $Gait Training: 8-22 mins                      Earney Navy, PTA Acute Rehabilitation Services Pager: [phone] Office: [phone]     Darliss Cheney 10/11/2019, 11:31 AM

## 2019-10-11 NOTE — Progress Notes (Signed)
Occupational Therapy Treatment Patient Details Name: Angela [name] MRN: [id number] DOB: [birth year] Today's Date: 10/11/2019    History of present illness 74 year old female with a history of A. fib (on Coumadin), DVT, nonischemic cardiomyopathy, rheumatic heart disease, sinus sick syndrome, CHF, who presented to the emergency department complaining of left lower extremity pain. This is been present for the last several days. She was seen in the ED a few days ago for chest pain. He also notes that she was complaining that her left leg was cold. Pt found to have thrombus left popliteal artery. She underwent left popliteal and tibial embolectomy 10/01/19.   OT comments  Patient continues to make steady progress towards goals in skilled OT session. Patient's session encompassed cotreat with PT in order promote pt safety, ADLs at the sink, promotion of increased cognitive function with attempting 2-3 step tasks and commands, and functional mobility ambulating household distances. Pt remains confused throughout session, and is unable to follow more than 1 step commands, and requires reinforcement of 1 step commands for thoroughness. Pt required 2 hand held assist to ambulate safely, and when 1 hand held assist attempted with PT (only son at home as caregiver) pt will reach out to stabilize on when ambulating. Pt requires set up and min A in order to complete oral care at the sink in standing, refusing other ADLs stating "those have been done for me". Pt would continue to benefit from skilled services during the remainder of her stay; will continue to follow acutely.    Follow Up Recommendations  Home health OT;Supervision/Assistance - 24 hour    Equipment Recommendations  3 in 1 bedside commode    Recommendations for Other Services      Precautions / Restrictions Precautions Precautions: Fall Restrictions Weight Bearing Restrictions: No       Mobility Bed Mobility Overal bed mobility: Needs  Assistance Bed Mobility: Supine to Sit     Supine to sit: HOB elevated;Min assist Sit to supine: Min assist      Transfers Overall transfer level: Needs assistance Equipment used: 1 person hand held assist Transfers: Sit to/from Stand Sit to Stand: Min assist;+2 safety/equipment Stand pivot transfers: Min assist       General transfer comment: overall pt requires MIN A +2 for sit<>stand from various surfaces. pt requires cues for hand placement during each trial.    Balance Overall balance assessment: Needs assistance Sitting-balance support: No upper extremity supported;Feet supported Sitting balance-Leahy Scale: Fair Sitting balance - Comments: close min guard   Standing balance support: Bilateral upper extremity supported;During functional activity Standing balance-Leahy Scale: Poor Standing balance comment: reliant on external support                           ADL either performed or assessed with clinical judgement   ADL Overall ADL's : Needs assistance/impaired     Grooming: Oral care;Set up;Min guard Grooming Details (indicate cue type and reason): min guard for balance at sink                 Toilet Transfer: Minimal assistance;+2 for safety/equipment;BSC;Cueing for safety;Cueing for sequencing Toilet Transfer Details (indicate cue type and reason): simulated with recliner transfer, decreased safety awareness, leans heavily into therapist's hands with HHA         Functional mobility during ADLs: Cueing for safety;Cueing for sequencing;Min guard General ADL Comments: Pt requires increased cues for sequencing, often refusing ADL self care tasks saying "someone came  in here and did them for me" Unable to follow more than 1 step tasks     Vision       Perception     Praxis      Cognition Arousal/Alertness: Awake/alert Behavior During Therapy: [facility] for tasks assessed/performed;Anxious Overall Cognitive Status: History of cognitive  impairments - at baseline Area of Impairment: Following commands;Safety/judgement;Problem solving;Awareness                   Current Attention Level: Focused Memory: Decreased short-term memory Following Commands: Follows one step commands inconsistently;Follows one step commands with increased time Safety/Judgement: Decreased awareness of deficits;Decreased awareness of safety Awareness: Intellectual Problem Solving: Difficulty sequencing;Requires verbal cues;Requires tactile cues;Slow processing General Comments: Pt often asking to sit down, then stating she wanted to walk more, often unsure of surroundings and task at hand        Exercises     Shoulder Instructions       General Comments      Pertinent Vitals/ Pain       Pain Assessment: Faces Faces Pain Scale: Hurts a little bit Pain Location: LLE with mobility Pain Descriptors / Indicators: Discomfort  Home Living                                          Prior Functioning/Environment              Frequency  Min 2X/week        Progress Toward Goals  OT Goals(current goals can now be found in the care plan section)  Progress towards OT goals: Progressing toward goals  Acute Rehab OT Goals Patient Stated Goal: home OT Goal Formulation: With patient Time For Goal Achievement: 10/16/19 Potential to Achieve Goals: Good  Plan Discharge plan remains appropriate    Co-evaluation    PT/OT/SLP Co-Evaluation/Treatment: Yes Reason for Co-Treatment: To address functional/ADL transfers;For patient/therapist safety   OT goals addressed during session: ADL's and self-care      AM-PAC OT "6 Clicks" Daily Activity     Outcome Measure   Help from another person eating meals?: A Little Help from another person taking care of personal grooming?: A Little Help from another person toileting, which includes using toliet, bedpan, or urinal?: A Lot Help from another person bathing (including  washing, rinsing, drying)?: A Lot Help from another person to put on and taking off regular upper body clothing?: A Little Help from another person to put on and taking off regular lower body clothing?: A Lot 6 Click Score: 15    End of Session Equipment Utilized During Treatment: Gait belt;Other (comment)(2 person hand held assist)  OT Visit Diagnosis: Other abnormalities of gait and mobility (R26.89);Muscle weakness (generalized) (M62.81);Other symptoms and signs involving cognitive function Pain - Right/Left: Left Pain - part of body: Leg   Activity Tolerance Patient tolerated treatment well   Patient Left in chair;with call bell/phone within reach;with chair alarm set;with nursing/sitter in room   Nurse Communication          Time: [id number] OT Time Calculation (min): 25 min  Charges: OT General Charges $OT Visit: 1 Visit OT Treatments $Self Care/Home Management : 8-22 mins  Corinne [place]s [place]. [place], [place] Acute Rehabilitation Services [phone] [place] 10/11/2019, 10:59 AM

## 2019-10-11 NOTE — Progress Notes (Signed)
Progress Note  Patient Name: Angela [name] Date of Encounter: 10/11/2019  Primary Cardiologist: Cristopher Peru, MD   Subjective   Denies any CP or SOB.   Inpatient Medications    Scheduled Meds: . amiodarone  200 mg Oral BID  . carvedilol  3.125 mg Oral BID WC  . Chlorhexidine Gluconate Cloth  6 each Topical Daily  . digoxin  125 mcg Oral Daily  . docusate sodium  100 mg Oral Daily  . furosemide  20 mg Oral Daily  . midodrine  10 mg Oral BID WC  . pantoprazole  40 mg Oral Daily  . polyethylene glycol  17 g Oral Daily  . warfarin  7.5 mg Oral ONCE-1800  . Warfarin - Pharmacist Dosing Inpatient   Does not apply q1800   Continuous Infusions: . sodium chloride    . heparin 800 Units/hr (10/11/19 0948)  . magnesium sulfate bolus IVPB     PRN Meds: sodium chloride, acetaminophen **OR** acetaminophen, alum & mag hydroxide-simeth, bisacodyl, guaiFENesin-dextromethorphan, hydrALAZINE, HYDROmorphone (DILAUDID) injection, labetalol, magnesium sulfate bolus IVPB, metoprolol tartrate, ondansetron, oxyCODONE, phenol, senna-docusate, tiZANidine   Vital Signs    Vitals:   10/11/19 0427 10/11/19 0919 10/11/19 0943 10/11/19 0947  BP: 97/62 97/66  93/78  Pulse: 74 73 87   Resp: 20 17  18   Temp: 98.1 F (36.7 C) 98 F (36.7 C)    TempSrc: Oral Oral    SpO2: 98%     Weight:      Height:        Intake/Output Summary (Last 24 hours) at 10/11/2019 0949 Last data filed at 10/11/2019 0700 Gross per 24 hour  Intake -  Output 400 ml  Net -400 ml   Last 3 Weights 10/10/2019 10/08/2019 10/06/2019  Weight (lbs) 132 lb 0.9 oz 132 lb 11.5 oz 131 lb 9.8 oz  Weight (kg) 59.9 kg 60.2 kg 59.7 kg      Telemetry    Paced rhythm with occasional PVCs and bigeminy - Personally Reviewed  ECG    09/29/2019, significant underlying artifact that make interpretation difficult, likely paced rhythm or sinus rhythm with widened QRS - Personally Reviewed  Physical Exam   GEN: frail, cacechtic  Neck:  No JVD Cardiac: RRR, no murmurs, rubs, or gallops.  Respiratory: Clear to auscultation bilaterally. GI: Soft, nontender, non-distended  MS: No edema; No deformity. Neuro:  Nonfocal  Psych: Normal affect   Labs    High Sensitivity Troponin:   Recent Labs  Lab 09/29/19 0131 09/29/19 0402  TROPONINIHS 176* 163*      Chemistry Recent Labs  Lab 10/10/19 0300  NA 138  K 3.2*  CL 101  CO2 29  GLUCOSE 107*  BUN 16  CREATININE 0.79  CALCIUM 9.1  GFRNONAA >60  GFRAA >60  ANIONGAP 8     Hematology Recent Labs  Lab 10/09/19 0316 10/10/19 0300 10/11/19 0305  WBC 4.9 4.7 4.2  RBC 2.96* 3.00* 3.06*  HGB 9.0* 9.0* 9.3*  HCT 29.1* 29.4* 29.8*  MCV 98.3 98.0 97.4  MCH 30.4 30.0 30.4  MCHC 30.9 30.6 31.2  RDW 17.2* 17.2* 17.2*  PLT 222 223 193    BNPNo results for input(s): BNP, PROBNP in the last 168 hours.   DDimer No results for input(s): DDIMER in the last 168 hours.   Radiology    No results found.  Cardiac Studies   Echo 10/27/2018 1. The left ventricle has a visually estimated ejection fraction of of  15-20%. The cavity  size was severely dilated. Left ventricular diastolic  Doppler parameters are indeterminate due to mitral valve  replacement/repair. Left ventricular diffuse  hypokinesis.  2. The right ventricle has moderately reduced systolic function. The  cavity was normal. There is no increase in right ventricular wall  thickness.  3. Left atrial size was severely dilated.  4. Right atrial size was severely dilated.  5. A mechanical valve is present in the mitral position. Procedure Date:  1983.  6. Bjork-Shiley mechanical mitral valve prosthesis functioning properly.  7. The tricuspid valve is normal in structure.  8. The aortic valve is normal in structure.  9. The pulmonic valve was normal in structure. Pulmonic valve  regurgitation is mild by color flow Doppler.  10. The inferior vena cava was normal in size with <50% respiratory   variability.   Patient Profile     74 y.o. female with PMH of NICM s/p St Jude BiV ICD, h/o cardiac arrest, VT storm, chronic atrial fibrillation, s/p MVR with mechanical bjork shiley valve, h/o CVA, DVT and peripheral arterial emboli presented on 10/01/2019 with subtherapeuric INR and embolic event to L leg. She underwent embolectomy to L popliteal artery by Dr. Oneida Alar on 2/5. Cardiology consulted for VT  Assessment & Plan    1. H/o VT storm: continue coreg, restarted on amiodarone, 200mg  BID for 2 weeks, then decrease to 200mg  daily thereafter  - no prolonged ventricular ectopy noted overnight, occasional bigeminy and isolated PVCs, continue with current plan  2. Chronic atrial fibrillation: rate controlled. On coumadin and coreg  3. Chronic systolic CHF and NICM: on 20mg  daily lasix. Euvolemic  4. Arterial embolic event s/p embolectomy to L popliteal artery by Dr. Oneida Alar on 2/5  - waiting to D/C home when INR >2, INR today 1.5  5. s/p MVR with mechanical bjork shiley valve      For questions or updates, please contact [facility] Please consult [email or web address] for contact info under        Signed, Almyra Deforest, [place]  10/11/2019, 9:49 AM

## 2019-10-11 NOTE — Progress Notes (Signed)
ANTICOAGULATION CONSULT NOTE  Pharmacy Consult:  Heparin / Coumadin Indication: L-leg embolus/AFib  No Known Allergies  Patient Measurements: Height: 5\' 6"  (167.6 cm) Weight: 132 lb 0.9 oz (59.9 kg) IBW/kg (Calculated) : 59.3 Heparin Dosing Weight: 57 kg  Vital Signs: Temp: 98.1 F (36.7 C) (02/15 0427) Temp Source: Oral (02/15 0427) BP: 97/62 (02/15 0427) Pulse Rate: 74 (02/15 0427)  Labs: Recent Labs    10/09/19 0316 10/09/19 0316 10/10/19 0300 10/11/19 0305  HGB 9.0*   < > 9.0* 9.3*  HCT 29.1*  --  29.4* 29.8*  PLT 222  --  223 193  LABPROT 15.7*  --  17.2* 18.0*  INR 1.3*  --  1.4* 1.5*  HEPARINUNFRC 0.60  --  0.42 0.67  CREATININE  --   --  0.79  --    < > = values in this interval not displayed.    Estimated Creatinine Clearance: 56.9 mL/min (by C-G formula based on SCr of 0.79 mg/dL).  Assessment: 59 YOF with history of Afib on Coumadin PTA.  Patient presented with acute leg pain and left leg embolus and s/p embolectomy on 10/01/19.  On 10/03/19, patient required evacuation of hematoma.  Pharmacy consulted for IV heparin dosing.  Coumadin was resumed on 10/05/19.  Of note, the newly resumed amiodarone may increase the anticoagulant effect of Coumadin but effect is likely seen within the first week of initiation.   Heparin level is therapeutic and toward the high end of normal.  INR is sub-therapeutic and trending up slowly.  No bleeding reported.  Home Coumadin regimen: 4 mg daily except 5 mg on Tues / Thurs  Goal of Therapy:  Heparin level 0.3-0.7 units/mL, aim for lower end due to recent bleeding INR 2-3 Monitor platelets by anticoagulation protocol: Yes    Plan:  Reduce heparin gtt to 800 units/hr  Repeat Coumadin 7.5mg  PO today.  Increase dose in AM if INR increased minimally. Daily heparin level, PT/INR and CBC Per cardiology, continue heparin until INR > 2.2 x 24 hours   Mailynn Everly D. Mina Marble, PharmD, BCPS, [place] 10/11/2019, 8:48 AM

## 2019-10-11 NOTE — Discharge Instructions (Signed)
[facility]  Discharge instructions  Lower Extremity Bypass Surgery  Please refer to the following instruction for your post-procedure care. Your surgeon or physician assistant will discuss any changes with you.  Activity  You are encouraged to walk as much as you can. You can slowly return to normal activities during the month after your surgery. Avoid strenuous activity and heavy lifting until your doctor tells you it's OK. Avoid activities such as vacuuming or swinging a golf club. Do not drive until your doctor give the OK and you are no longer taking prescription pain medications. It is also normal to have difficulty with sleep habits, eating and bowel movement after surgery. These will go away with time.  Bathing/Showering  You may shower after you go home. Do not soak in a bathtub, hot tub, or swim until the incision heals completely.  Incision Care  Clean your incision with mild soap and water. Shower every day. Pat the area dry with a clean towel. You do not need a bandage unless otherwise instructed. Do not apply any ointments or creams to your incision. If you have open wounds you will be instructed how to care for them or a visiting nurse may be arranged for you. If you have staples or sutures along your incision they will be removed at your post-op appointment. You may have skin glue on your incision. Do not peel it off. It will come off on its own in about one week. If you have a great deal of moisture in your groin, use a gauze help keep this area dry.  Diet  Resume your normal diet. There are no special food restrictions following this procedure. A low fat/ low cholesterol diet is recommended for all patients with vascular disease. In order to heal from your surgery, it is CRITICAL to get adequate nutrition. Your body requires vitamins, minerals, and protein. Vegetables are the best source of vitamins and minerals. Vegetables also provide the  perfect balance of protein. Processed food has little nutritional value, so try to avoid this.  Medications  Resume taking all your medications unless your doctor or nurse practitioner tells you not to. If your incision is causing pain, you may take over-the-counter pain relievers such as acetaminophen (Tylenol). If you were prescribed a stronger pain medication, please aware these medication can cause nausea and constipation. Prevent nausea by taking the medication with a snack or meal. Avoid constipation by drinking plenty of fluids and eating foods with high amount of fiber, such as fruits, vegetables, and grains. Take Colase 100 mg (an over-the-counter stool softener) twice a day as needed for constipation. Do not take Tylenol if you are taking prescription pain medications.  Follow Up  Our office will schedule a follow up appointment 2-3 weeks following discharge.  Please call us immediately for any of the following conditions  .Severe or worsening pain in your legs or feet while at rest or while walking .Increase pain, redness, warmth, or drainage (pus) from your incision site(s) Fever of 101 degree or higher The swelling in your leg with the bypass suddenly worsens and becomes more painful than when you were in the hospital If you have been instructed to feel your graft pulse then you should do so every day. If you can no longer feel this pulse, call the office immediately. Not all patients are given this instruction.  Leg swelling is common after leg bypass surgery.  The swelling should improve over a few months  following surgery. To improve the swelling, you may elevate your legs above the level of your heart while you are sitting or resting. Your surgeon or physician assistant may ask you to apply an ACE wrap or wear compression (TED) stockings to help to reduce swelling.  Reduce your risk of vascular disease  Stop smoking. If you would like help call [facility] at 1-800-QUIT-NOW  (226) 279-6719) or [place] at [phone].  Manage your cholesterol Maintain a desired weight Control your diabetes weight Control your diabetes Keep your blood pressure down  If you have any questions, please call the office at [phone]    Information on my medicine - Coumadin   (Warfarin)  This medication education was reviewed with me or my healthcare representative as part of my discharge preparation.  The pharmacist that spoke with me during my hospital stay was:  Maryan Char Lippucci, RPH  Why was Coumadin prescribed for you? Coumadin was prescribed for you because you have a blood clot or a medical condition that can cause an increased risk of forming blood clots. Blood clots can cause serious health problems by blocking the flow of blood to the heart, lung, or brain. Coumadin can prevent harmful blood clots from forming.   What test will check on my response to Coumadin? While on Coumadin (warfarin) you will need to have an INR test regularly to ensure that your dose is keeping you in the desired range. The INR (international normalized ratio) number is calculated from the result of the laboratory test called prothrombin time (PT).  If an INR APPOINTMENT HAS NOT ALREADY BEEN MADE FOR YOU please schedule an appointment to have this lab work done by your health care provider within 7 days. Your INR goal is usually a number between:  2 to 3 or your provider may give you a more narrow range like 2-2.5.  Ask your health care provider during an office visit what your goal INR is.  What  do you need to  know  About  COUMADIN? Take Coumadin (warfarin) exactly as prescribed by your healthcare provider about the same time each day.  DO NOT stop taking without talking to the doctor who prescribed the medication.  Stopping without other blood clot prevention medication to take the place of Coumadin may increase your risk of developing a new clot or stroke.  Get refills before you run  out.  What do you do if you miss a dose? If you miss a dose, take it as soon as you remember on the same day then continue your regularly scheduled regimen the next day.  Do not take two doses of Coumadin at the same time.  Important Safety Information A possible side effect of Coumadin (Warfarin) is an increased risk of bleeding. You should call your healthcare provider right away if you experience any of the following: ? Bleeding from an injury or your nose that does not stop. ? Unusual colored urine (red or dark brown) or unusual colored stools (red or black). ? Unusual bruising for unknown reasons. ? A serious fall or if you hit your head (even if there is no bleeding).  Some foods or medicines interact with Coumadin (warfarin) and might alter your response to warfarin. To help avoid this: ? Eat a balanced diet, maintaining a consistent amount of Vitamin K. ? Notify your provider about major diet changes you plan to make. ? Avoid alcohol or limit your intake to 1 drink for women and 2 drinks for men per  day. (1 drink is 5 oz. wine, 12 oz. beer, or 1.5 oz. liquor.)  Make sure that ANY health care provider who prescribes medication for you knows that you are taking Coumadin (warfarin).  Also make sure the healthcare provider who is monitoring your Coumadin knows when you have started a new medication including herbals and non-prescription products.  Coumadin (Warfarin)  Major Drug Interactions  Increased Warfarin Effect Decreased Warfarin Effect  Alcohol (large quantities) Antibiotics (esp. Septra/Bactrim, Flagyl, Cipro) Amiodarone (Cordarone) Aspirin (ASA) Cimetidine (Tagamet) Megestrol (Megace) NSAIDs (ibuprofen, naproxen, etc.) Piroxicam (Feldene) Propafenone (Rythmol SR) Propranolol (Inderal) Isoniazid (INH) Posaconazole (Noxafil) Barbiturates (Phenobarbital) Carbamazepine (Tegretol) Chlordiazepoxide (Librium) Cholestyramine (Questran) Griseofulvin Oral  Contraceptives Rifampin Sucralfate (Carafate) Vitamin K   Coumadin (Warfarin) Major Herbal Interactions  Increased Warfarin Effect Decreased Warfarin Effect  Garlic Ginseng Ginkgo biloba Coenzyme Q10 Green tea St. John's wort    Coumadin (Warfarin) FOOD Interactions  Eat a consistent number of servings per week of foods HIGH in Vitamin K (1 serving =  cup)  Collards (cooked, or boiled & drained) Kale (cooked, or boiled & drained) Mustard greens (cooked, or boiled & drained) Parsley *serving size only =  cup Spinach (cooked, or boiled & drained) Swiss chard (cooked, or boiled & drained) Turnip greens (cooked, or boiled & drained)  Eat a consistent number of servings per week of foods MEDIUM-HIGH in Vitamin K (1 serving = 1 cup)  Asparagus (cooked, or boiled & drained) Broccoli (cooked, boiled & drained, or raw & chopped) Brussel sprouts (cooked, or boiled & drained) *serving size only =  cup Lettuce, raw (green leaf, endive, romaine) Spinach, raw Turnip greens, raw & chopped   These websites have more information on Coumadin (warfarin):  FailFactory.se; VeganReport.com.au;

## 2019-10-11 NOTE — Progress Notes (Addendum)
  Progress Note    10/11/2019 7:34 AM 8 Days Post-Op  Subjective:  No compalints  Afebrile HR 70's  [id number] systolic [id number] RA  Vitals:   10/11/19 0024 10/11/19 0427  BP: 99/64 97/62  Pulse: 72 74  Resp: 17 20  Temp: (!) 97.5 F (36.4 C) 98.1 F (36.7 C)  SpO2: 95% 98%    Physical Exam: Cardiac:  regular Lungs:  Non labored Incisions:  Clean and dry Extremities:  Brisk doppler signals left DP/PT; left calf is soft.  CBC    Component Value Date/Time   WBC 4.2 10/11/2019 0305   RBC 3.06 (L) 10/11/2019 0305   HGB 9.3 (L) 10/11/2019 0305   HGB 14.1 09/29/2008 1038   HCT 29.8 (L) 10/11/2019 0305   HCT 41.7 09/29/2008 1038   PLT 193 10/11/2019 0305   PLT 134 (L) 09/29/2008 1038   MCV 97.4 10/11/2019 0305   MCV 89.9 09/29/2008 1038   MCH 30.4 10/11/2019 0305   MCHC 31.2 10/11/2019 0305   RDW 17.2 (H) 10/11/2019 0305   RDW 16.2 (H) 09/29/2008 1038   LYMPHSABS 1.3 10/01/2019 1533   LYMPHSABS 0.8 (L) 09/29/2008 1038   MONOABS 0.5 10/01/2019 1533   MONOABS 0.3 09/29/2008 1038   EOSABS 0.1 10/01/2019 1533   EOSABS 0.0 09/29/2008 1038   BASOSABS 0.0 10/01/2019 1533   BASOSABS 0.0 09/29/2008 1038    BMET    Component Value Date/Time   NA 138 10/10/2019 0300   K 3.2 (L) 10/10/2019 0300   CL 101 10/10/2019 0300   CO2 29 10/10/2019 0300   GLUCOSE 107 (H) 10/10/2019 0300   BUN 16 10/10/2019 0300   CREATININE 0.79 10/10/2019 0300   CALCIUM 9.1 10/10/2019 0300   GFRNONAA >60 10/10/2019 0300   GFRAA >60 10/10/2019 0300    INR    Component Value Date/Time   INR 1.5 (H) 10/11/2019 0305     Intake/Output Summary (Last 24 hours) at 10/11/2019 0734 Last data filed at 10/11/2019 0700 Gross per 24 hour  Intake --  Output 400 ml  Net -400 ml     Assessment:  74 y.o. female is s/p:  thrombectomy LLE with subsequent calf hematoma evacuation  8 Days Post-Op  Plan: -pt with brisk doppler signals left foot.  -left calf is soft and incision is healing nicely.   -INR slowly increasing now and is 1.5 -amiodarone restarted over the weekend by cardiology.  Appreciate their assistance.  -acute blood loss anemia-hgb slowly rising -DVT prophylaxis:  Heparin gtt to coumadin bridge.   Leontine Locket, PA-C [facility] [phone] 10/11/2019 7:34 AM  Agree with above.  Home when INR 2  Ruta Hinds, MD [facility] [place] Office: [phone]

## 2019-10-12 DIAGNOSIS — I743 Embolism and thrombosis of arteries of the lower extremities: Secondary | ICD-10-CM

## 2019-10-12 LAB — MAGNESIUM: Magnesium: 1.9 mg/dL (ref 1.7–2.4)

## 2019-10-12 LAB — BASIC METABOLIC PANEL
Anion gap: 14 (ref 5–15)
BUN: 15 mg/dL (ref 8–23)
CO2: 26 mmol/L (ref 22–32)
Calcium: 9.5 mg/dL (ref 8.9–10.3)
Chloride: 100 mmol/L (ref 98–111)
Creatinine, Ser: 0.98 mg/dL (ref 0.44–1.00)
GFR calc Af Amer: >60 mL/min (ref >60)
GFR calc non Af Amer: 56 mL/min — ABNORMAL LOW (ref >60)
Glucose, Bld: 114 mg/dL — ABNORMAL HIGH (ref 70–99)
Potassium: 3.5 mmol/L (ref 3.5–5.1)
Sodium: 140 mmol/L (ref 135–145)

## 2019-10-12 LAB — CBC
HCT: 33.2 % — ABNORMAL LOW (ref 36.0–46.0)
Hemoglobin: 10.1 g/dL — ABNORMAL LOW (ref 12.0–15.0)
MCH: 30.2 pg (ref 26.0–34.0)
MCHC: 30.4 g/dL (ref 30.0–36.0)
MCV: 99.4 fL (ref 80.0–100.0)
Platelets: 243 10*3/uL (ref 150–400)
RBC: 3.34 MIL/uL — ABNORMAL LOW (ref 3.87–5.11)
RDW: 16.9 % — ABNORMAL HIGH (ref 11.5–15.5)
WBC: 4.9 10*3/uL (ref 4.0–10.5)
nRBC: 0 % (ref 0.0–0.2)

## 2019-10-12 LAB — PROTIME-INR
INR: 1.6 — ABNORMAL HIGH (ref 0.8–1.2)
Prothrombin Time: 19.1 seconds — ABNORMAL HIGH (ref 11.4–15.2)

## 2019-10-12 LAB — HEPARIN LEVEL (UNFRACTIONATED): Heparin Unfractionated: 0.41 IU/mL (ref 0.30–0.70)

## 2019-10-12 MED ORDER — WARFARIN SODIUM 10 MG PO TABS
10.0000 mg | ORAL_TABLET | Freq: Once | ORAL | Status: AC
  Administered 2019-10-12: 10 mg via ORAL
  Filled 2019-10-12: qty 1

## 2019-10-12 NOTE — Progress Notes (Signed)
RN notified patient had 9 beats of VTACH. Patient assessed, with in her normal limits. MD notified

## 2019-10-12 NOTE — Progress Notes (Signed)
Progress Note  Patient Name: Angela [name] Date of Encounter: 10/12/2019  Primary Cardiologist: Cristopher Peru, MD   Subjective   Frustrated and confused about why she is still here.  Feeling well physically.  No more chest pain.  Wants to get out of the chair and walk with a cane.  Inpatient Medications    Scheduled Meds: . amiodarone  200 mg Oral BID  . carvedilol  3.125 mg Oral BID WC  . Chlorhexidine Gluconate Cloth  6 each Topical Daily  . diclofenac Sodium  2 g Topical QID  . digoxin  125 mcg Oral Daily  . docusate sodium  100 mg Oral Daily  . furosemide  20 mg Oral Daily  . midodrine  10 mg Oral TID WC  . pantoprazole  40 mg Oral Daily  . polyethylene glycol  17 g Oral Daily  . warfarin  10 mg Oral ONCE-1800  . Warfarin - Pharmacist Dosing Inpatient   Does not apply q1800   Continuous Infusions: . sodium chloride    . heparin 800 Units/hr (10/12/19 [id number])  . magnesium sulfate bolus IVPB     PRN Meds: sodium chloride, acetaminophen **OR** acetaminophen, alum & mag hydroxide-simeth, bisacodyl, guaiFENesin-dextromethorphan, hydrALAZINE, HYDROmorphone (DILAUDID) injection, labetalol, magnesium sulfate bolus IVPB, metoprolol tartrate, ondansetron, oxyCODONE, phenol, senna-docusate, tiZANidine   Vital Signs    Vitals:   10/12/19 0200 10/12/19 0400 10/12/19 0822 10/12/19 0823  BP:  140/80 100/69   Pulse: 74  80 76  Resp:  16 19   Temp:  98 F (36.7 C) 98.4 F (36.9 C)   TempSrc:  Oral Oral   SpO2:  99% 96%   Weight:  58 kg    Height:        Intake/Output Summary (Last 24 hours) at 10/12/2019 0937 Last data filed at 10/11/2019 2100 Gross per 24 hour  Intake 120 ml  Output --  Net 120 ml   Last 3 Weights 10/12/2019 10/10/2019 10/08/2019  Weight (lbs) 127 lb 13.9 oz 132 lb 0.9 oz 132 lb 11.5 oz  Weight (kg) 58 kg 59.9 kg 60.2 kg      Telemetry    Afib.  VP.  PVCs.  NSVT up to 4 beats - Personally Reviewed  ECG    n/a - Personally Reviewed  Physical Exam    VS:  BP 100/69 (BP Location: Right Arm)   Pulse 76   Temp 98.4 F (36.9 C) (Oral)   Resp 19   Ht 5\' 6"  (1.676 m)   Wt 58 kg   SpO2 96%   BMI 20.64 kg/m  , BMI Body mass index is 20.64 kg/m. GENERAL:  Frail.  Frustrated.  No acute medial distress HEENT: Pupils equal round and reactive, fundi not visualized, oral mucosa unremarkable NECK:  No jugular venous distention, waveform within normal limits, carotid upstroke brisk and symmetric, no bruits LUNGS:  Clear to auscultation bilaterally HEART:  Mostly regular.  PMI not displaced or sustained, Mechanical S1.  S2 within normal limits, no S3, no S4, no clicks, no rubs, no murmurs ABD:  Flat, positive bowel sounds normal in frequency in pitch, no bruits, no rebound, no guarding, no midline pulsatile mass, no hepatomegaly, no splenomegaly EXT:  2 plus pulses throughout, no edema, no cyanosis no clubbing SKIN:  No rashes no nodules NEURO:  Cranial nerves II through XII grossly intact, motor grossly intact throughout PSYCH:  Oriented to person   Labs    High Sensitivity Troponin:   Recent Labs  Lab 09/29/19 0131 09/29/19 0402  TROPONINIHS 176* 163*      Chemistry Recent Labs  Lab 10/10/19 0300 10/12/19 0335  NA 138 140  K 3.2* 3.5  CL 101 100  CO2 29 26  GLUCOSE 107* 114*  BUN 16 15  CREATININE 0.79 0.98  CALCIUM 9.1 9.5  GFRNONAA >60 56*  GFRAA >60 >60  ANIONGAP 8 14     Hematology Recent Labs  Lab 10/10/19 0300 10/11/19 0305 10/12/19 0335  WBC 4.7 4.2 4.9  RBC 3.00* 3.06* 3.34*  HGB 9.0* 9.3* 10.1*  HCT 29.4* 29.8* 33.2*  MCV 98.0 97.4 99.4  MCH 30.0 30.4 30.2  MCHC 30.6 31.2 30.4  RDW 17.2* 17.2* 16.9*  PLT 223 193 243    BNPNo results for input(s): BNP, PROBNP in the last 168 hours.   DDimer No results for input(s): DDIMER in the last 168 hours.   Radiology    No results found.  Cardiac Studies   Echo 10/27/18: IMPRESSIONS   1. The left ventricle has a visually estimated ejection  fraction of of  15-20%. The cavity size was severely dilated. Left ventricular diastolic  Doppler parameters are indeterminate due to mitral valve  replacement/repair. Left ventricular diffuse  hypokinesis.  2. The right ventricle has moderately reduced systolic function. The  cavity was normal. There is no increase in right ventricular wall  thickness.  3. Left atrial size was severely dilated.  4. Right atrial size was severely dilated.  5. A mechanical valve is present in the mitral position. Procedure Date:  1983.  6. Bjork-Shiley mechanical mitral valve prosthesis functioning properly.  7. The tricuspid valve is normal in structure.  8. The aortic valve is normal in structure.  9. The pulmonic valve was normal in structure. Pulmonic valve  regurgitation is mild by color flow Doppler.  10. The inferior vena cava was normal in size with <50% respiratory  variability.   Patient Profile     74 y.o. 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  Assessment & Plan    # Arterial embolism: # Mechanical mitral valve:  Underwent embolectomy up to the left popliteal artery on 10/01/2019.  This occurred in the setting of subtherapeutic INR.  Currently bridging with heparin awaiting INR to reach 2.0.    # Chronic systolic and diastolic heart failure: She appears euvolemic on oral Lasix. Continue digoxin and carvedilol.  # Chronic atrial fibrillation:   Heart rate is well controlled on carvedilol, digoxin and amiodarone.  However multiple doses of carvedilol have been held due to hypotension.  This is being used for rate control.  Increased midodrine 2/15 and she has received two consecutive doses of   # Hypotension:  She is currently on midodrine.  Dose was increased  to 3 times daily on 2/15.n  as she has orthostatic hypotension and low blood pressure at rest.      # Chest pain:  She also reports chest discomfort responsive to palpation.  We will order Voltaren gel.  # VT:  Stable.  Continue carvedilol and amiodarone.     # Dementia:  Patient confused and frustrated about being here.  Called to update son and left a VM.  For questions or updates, please contact [place] Please consult [email or web address] for contact info under  Signed, Skeet Latch, MD  10/12/2019, 9:37 AM

## 2019-10-12 NOTE — Progress Notes (Addendum)
  Progress Note    10/12/2019 7:37 AM 9 Days Post-Op  Subjective:  Denies foot pain.  Says she needs to go to the bathroom  Afebrile HR 70's-80's  [id number] sytolic [id number] RA  Vitals:   10/12/19 0200 10/12/19 0400  BP:  140/80  Pulse: 74   Resp:  16  Temp:  98 F (36.7 C)  SpO2:  99%    Physical Exam: Cardiac:  regular Lungs:  Non labored Incisions:  Clean and dry Extremities:   left PT/DP are palpable; motor in tact; Left calf soft Neuro:  Not alert to place or year   CBC    Component Value Date/Time   WBC 4.9 10/12/2019 0335   RBC 3.34 (L) 10/12/2019 0335   HGB 10.1 (L) 10/12/2019 0335   HGB 14.1 09/29/2008 1038   HCT 33.2 (L) 10/12/2019 0335   HCT 41.7 09/29/2008 1038   PLT 243 10/12/2019 0335   PLT 134 (L) 09/29/2008 1038   MCV 99.4 10/12/2019 0335   MCV 89.9 09/29/2008 1038   MCH 30.2 10/12/2019 0335   MCHC 30.4 10/12/2019 0335   RDW 16.9 (H) 10/12/2019 0335   RDW 16.2 (H) 09/29/2008 1038   LYMPHSABS 1.3 10/01/2019 1533   LYMPHSABS 0.8 (L) 09/29/2008 1038   MONOABS 0.5 10/01/2019 1533   MONOABS 0.3 09/29/2008 1038   EOSABS 0.1 10/01/2019 1533   EOSABS 0.0 09/29/2008 1038   BASOSABS 0.0 10/01/2019 1533   BASOSABS 0.0 09/29/2008 1038    BMET    Component Value Date/Time   NA 140 10/12/2019 0335   K 3.5 10/12/2019 0335   CL 100 10/12/2019 0335   CO2 26 10/12/2019 0335   GLUCOSE 114 (H) 10/12/2019 0335   BUN 15 10/12/2019 0335   CREATININE 0.98 10/12/2019 0335   CALCIUM 9.5 10/12/2019 0335   GFRNONAA 56 (L) 10/12/2019 0335   GFRAA >60 10/12/2019 0335    INR    Component Value Date/Time   INR 1.6 (H) 10/12/2019 0335     Intake/Output Summary (Last 24 hours) at 10/12/2019 0737 Last data filed at 10/11/2019 2100 Gross per 24 hour  Intake 120 ml  Output --  Net 120 ml   INR 1.6  Assessment:  74 y.o. female is s/p:  thrombectomy LLE with subsequent calf hematoma evacuation  9 Days Post-Op  Plan: -pt with palpable left DP/PT  pulses -INR is 1.6.  Slowly rising -DVT prophylaxis:  Heparin to coumadin bridge -pt is not alert to place or year, but does not seem different from this past weekend.    Leontine Locket, PA-C [facility] [phone] 10/12/2019 7:37 AM  Agree with above. Home when INR 2  Ruta Hinds, MD [facility] [place] Office: [phone]

## 2019-10-12 NOTE — Progress Notes (Signed)
Patient noticeably more confused towards end of shift. Patient stating "husband is coming to pick her up" and "we are not going home, we are going down the street". Patient reoriented and reminded of place, date, and time. Patient as well requesting the bed pan while already being on the bed pan.

## 2019-10-12 NOTE — Progress Notes (Signed)
ANTICOAGULATION CONSULT NOTE  Pharmacy Consult:  Heparin / Coumadin Indication: L-leg embolus/AFib  No Known Allergies  Patient Measurements: Height: 5\' 6"  (167.6 cm) Weight: 127 lb 13.9 oz (58 kg) IBW/kg (Calculated) : 59.3 Heparin Dosing Weight: 57 kg  Vital Signs: Temp: 98 F (36.7 C) (02/16 0400) Temp Source: Oral (02/16 0400) BP: 140/80 (02/16 0400) Pulse Rate: 74 (02/16 0200)  Labs: Recent Labs    10/10/19 0300 10/10/19 0300 10/11/19 0305 10/12/19 0335  HGB 9.0*   < > 9.3* 10.1*  HCT 29.4*  --  29.8* 33.2*  PLT 223  --  193 243  LABPROT 17.2*  --  18.0* 19.1*  INR 1.4*  --  1.5* 1.6*  HEPARINUNFRC 0.42  --  0.67 0.41  CREATININE 0.79  --   --  0.98   < > = values in this interval not displayed.    Estimated Creatinine Clearance: 45.4 mL/min (by C-G formula based on SCr of 0.98 mg/dL).  Assessment: 72 YOF with history of Afib on Coumadin PTA.  Patient presented with acute leg pain and left leg embolus and s/p embolectomy on 10/01/19.  On 10/03/19, patient required evacuation of hematoma.  Pharmacy consulted for IV heparin dosing.  Coumadin was resumed on 10/05/19.  Of note, the newly resumed amiodarone may increase the anticoagulant effect of Coumadin but effect is likely seen within the first week of initiation.   Heparin level is therapeutic and INR is trending up slowly.  No bleeding reported.  Home Coumadin regimen: 4 mg daily except 5 mg on Tues / Thurs  Goal of Therapy:  Heparin level 0.3-0.7 units/mL, aim for lower end due to recent bleeding INR 2-3 Monitor platelets by anticoagulation protocol: Yes    Plan:  Continue heparin gtt at 800 units/hr  Increase to Coumadin 10mg  PO today Daily heparin level, PT/INR and CBC   Destyn Schuyler D. Mina Marble, PharmD, BCPS, [place] 10/12/2019, 7:34 AM

## 2019-10-13 LAB — CBC
HCT: 29.6 % — ABNORMAL LOW (ref 36.0–46.0)
Hemoglobin: 9.2 g/dL — ABNORMAL LOW (ref 12.0–15.0)
MCH: 30.4 pg (ref 26.0–34.0)
MCHC: 31.1 g/dL (ref 30.0–36.0)
MCV: 97.7 fL (ref 80.0–100.0)
Platelets: 222 10*3/uL (ref 150–400)
RBC: 3.03 MIL/uL — ABNORMAL LOW (ref 3.87–5.11)
RDW: 16.8 % — ABNORMAL HIGH (ref 11.5–15.5)
WBC: 3.9 10*3/uL — ABNORMAL LOW (ref 4.0–10.5)
nRBC: 0 % (ref 0.0–0.2)

## 2019-10-13 LAB — PROTIME-INR
INR: 2 — ABNORMAL HIGH (ref 0.8–1.2)
Prothrombin Time: 22.9 seconds — ABNORMAL HIGH (ref 11.4–15.2)

## 2019-10-13 LAB — CALCIUM, IONIZED: Calcium, Ionized, Serum: 5 mg/dL (ref 4.5–5.6)

## 2019-10-13 LAB — HEPARIN LEVEL (UNFRACTIONATED): Heparin Unfractionated: 0.58 IU/mL (ref 0.30–0.70)

## 2019-10-13 MED ORDER — WARFARIN SODIUM 5 MG PO TABS: 5.0000 mg | ORAL_TABLET | ORAL | 2 refills | Status: DC

## 2019-10-13 MED ORDER — WARFARIN SODIUM 1 MG PO TABS: 2.0000 mg | ORAL_TABLET | ORAL | Status: DC

## 2019-10-13 MED ORDER — WARFARIN SODIUM 1 MG PO TABS: 4.0000 mg | ORAL_TABLET | ORAL | Status: DC

## 2019-10-13 MED ORDER — AMIODARONE HCL 200 MG PO TABS: 200.0000 mg | ORAL_TABLET | Freq: Two times a day (BID) | ORAL | 3 refills | Status: DC

## 2019-10-13 MED ORDER — WARFARIN SODIUM 5 MG PO TABS: 5.0000 mg | ORAL_TABLET | Freq: Every day | ORAL | 2 refills | Status: DC

## 2019-10-13 MED ORDER — MIDODRINE HCL 10 MG PO TABS: 10.0000 mg | ORAL_TABLET | Freq: Three times a day (TID) | ORAL | 1 refills | Status: DC

## 2019-10-13 MED ORDER — AMIODARONE HCL 200 MG PO TABS: 200.0000 mg | ORAL_TABLET | Freq: Every day | ORAL | 3 refills | Status: DC

## 2019-10-13 MED ORDER — AMIODARONE HCL 200 MG PO TABS: 200.0000 mg | ORAL_TABLET | Freq: Two times a day (BID) | ORAL | 0 refills | Status: DC

## 2019-10-13 NOTE — Care Management Important Message (Signed)
Important Message  Patient Details  Name: Angela [name] MRN: [id number] Date of Birth: 1946/03/27   Medicare Important Message Given:  Yes     Shelda Altes 10/13/2019, 9:04 AM

## 2019-10-13 NOTE — Progress Notes (Addendum)
  Progress Note    10/13/2019 8:08 AM 10 Days Post-Op  Subjective:  Ready to go home   Vitals:   10/13/19 0040 10/13/19 0425  BP: 96/64 95/62  Pulse: 77 74  Resp:  15  Temp: 97.6 F (36.4 C) 97.8 F (36.6 C)  SpO2: 95% 96%   Physical Exam Lungs:  Non labored Incisions:  L popliteal incision healing well Extremities:  Palpable L DP pulse Neurologic: A&O  CBC    Component Value Date/Time   WBC 3.9 (L) 10/13/2019 0332   RBC 3.03 (L) 10/13/2019 0332   HGB 9.2 (L) 10/13/2019 0332   HGB 14.1 09/29/2008 1038   HCT 29.6 (L) 10/13/2019 0332   HCT 41.7 09/29/2008 1038   PLT 222 10/13/2019 0332   PLT 134 (L) 09/29/2008 1038   MCV 97.7 10/13/2019 0332   MCV 89.9 09/29/2008 1038   MCH 30.4 10/13/2019 0332   MCHC 31.1 10/13/2019 0332   RDW 16.8 (H) 10/13/2019 0332   RDW 16.2 (H) 09/29/2008 1038   LYMPHSABS 1.3 10/01/2019 1533   LYMPHSABS 0.8 (L) 09/29/2008 1038   MONOABS 0.5 10/01/2019 1533   MONOABS 0.3 09/29/2008 1038   EOSABS 0.1 10/01/2019 1533   EOSABS 0.0 09/29/2008 1038   BASOSABS 0.0 10/01/2019 1533   BASOSABS 0.0 09/29/2008 1038    BMET    Component Value Date/Time   NA 140 10/12/2019 0335   K 3.5 10/12/2019 0335   CL 100 10/12/2019 0335   CO2 26 10/12/2019 0335   GLUCOSE 114 (H) 10/12/2019 0335   BUN 15 10/12/2019 0335   CREATININE 0.98 10/12/2019 0335   CALCIUM 9.5 10/12/2019 0335   GFRNONAA 56 (L) 10/12/2019 0335   GFRAA >60 10/12/2019 0335    INR    Component Value Date/Time   INR 2.0 (H) 10/13/2019 0332     Intake/Output Summary (Last 24 hours) at 10/13/2019 [id number] Last data filed at 10/13/2019 0600 Gross per 24 hour  Intake 944.54 ml  Output 250 ml  Net 694.54 ml     Assessment/Plan:  74 y.o. female is s/p thrombectomy LLE with subsequent calf hematoma evacuation 10 Days Post-Op   L foot well perfused with palpable DP pulse INR 2; d/c heparin Home today with home health PT   Dagoberto Ligas, PA-C [facility] [phone] 10/13/2019 8:08 AM  Agree with above. D/c home today Will arrange home INR check Home health PT Needs follow up in office in 2-3 weeks  Ruta Hinds, MD [facility] [place] Office: [phone]

## 2019-10-13 NOTE — Discharge Summary (Signed)
Discharge Summary  Patient ID: Angela [name] [id number] 73 y.o. 04-28-1946  Admit date: 10/01/2019  Discharge date and time: 10/13/2019  2:17 PM   Admitting Physician: Elam Dutch, MD   Discharge Physician: same  Admission Diagnoses: Arterial occlusion [I70.90] Sensation of cold in leg [R20.9] Femoral artery thrombosis, left [facility]) [I74.3]  Discharge Diagnoses: same Chronic afib  Admission Condition: poor  Discharged Condition: fair  Indication for Admission: Ischemic left lower extremity  Hospital Course: Angela [name] is a 74 year old female who presented to the emergency department with left lower extremity ischemia on 10/01/2019.  She was taken emergently to the operating room by Dr. Oneida Alar for thromboembolectomy of left lower extremity.  She returned to the operating room on 10/03/2019 for evacuation of left calf hematoma and placement of JP drain.  She was maintained on IV heparin throughout her hospital stay.  Physical therapy and Occupational Therapy were consulted and recommended CIR/SNF.  After discussion with family as well as patient, placement was refused and home health PT/OT was preferred.  She was restarted on Coumadin with a IV heparin bridge.  Much of her hospital stay consisted of titrating Coumadin back to an INR greater than 2.  Towards the end of her hospital stay she developed chest discomfort and frequent runs of VT.  Cardiology was consulted she was restarted on amiodarone and midodrine dose was increased.  She will follow up with cardiology as an outpatient and potentially decrease amiodarone dose.  She will follow-up with her PCP for INR checks.  She will follow-up with Dr. Oneida Alar on 10/28/2019 for left popliteal incision check.  It should be noted that throughout her hospital stay after hematoma evacuation she maintained a palpable left DP pulse.  Home health PT/OT was arranged along with home DME.  She was discharged this morning in stable condition.   Consults: cardiology  Treatments: surgery: Left lower extremity thromboembolectomy by Dr. Oneida Alar on 10/01/2019 Evacuation of hematoma left calf by Dr. Oneida Alar on 10/03/2019  Discharge Exam: See progress note 10/13/2019 Vitals:   10/13/19 0814 10/13/19 1138  BP: 107/61 95/60  Pulse: 72 68  Resp: 18 18  Temp: 98.1 F (36.7 C) 97.9 F (36.6 C)  SpO2: 99% 98%     Disposition: Discharge disposition: 01-Home or Self Care       Patient Instructions:  Allergies as of 10/13/2019   No Known Allergies     Medication List    TAKE these medications   AIRBORNE GUMMIES PO Take 1 tablet by mouth daily. Notes to patient: Take as you were at home   alendronate 70 MG tablet Commonly known as: FOSAMAX Take 70 mg by mouth once a week. Notes to patient: Take as you were at home   amiodarone 200 MG tablet Commonly known as: PACERONE Take 1 tablet (200 mg total) by mouth 2 (two) times daily for 10 days. What changed: when to take this Notes to patient: This evening 2/17   amiodarone 200 MG tablet Commonly known as: Pacerone Take 1 tablet (200 mg total) by mouth daily. Start taking on: October 23, 2019 What changed: You were already taking a medication with the same name, and this prescription was added. Make sure you understand how and when to take each. Notes to patient: On February 27 take amiodarone only once daily in the morning    carvedilol 3.125 MG tablet Commonly known as: COREG Take 1 tablet (3.125 mg total) by mouth 2 (two) times daily with a meal. Notes to  patient: This evening 2/17   digoxin 0.125 MG tablet Commonly known as: LANOXIN Take 125 mcg by mouth daily. Notes to patient: Tomorrow morning 2/18   furosemide 20 MG tablet Commonly known as: LASIX Take 20 mg by mouth daily. Notes to patient: Tomorrow morning 2/18   midodrine 10 MG tablet Commonly known as: PROAMATINE Take 1 tablet (10 mg total) by mouth 3 (three) times daily. What changed: when to take this  Notes to patient: This evening 2/17   oxyCODONE 5 MG immediate release tablet Commonly known as: Oxy IR/ROXICODONE Take 1 tablet (5 mg total) by mouth every 6 (six) hours as needed for moderate pain. Notes to patient: As needed for pain   potassium chloride 10 MEQ tablet Commonly known as: KLOR-CON Take 1 tablet (10 mEq total) by mouth daily for 30 days. Notes to patient: Take as you were at home   tiZANidine 4 MG tablet Commonly known as: ZANAFLEX Take 4 mg by mouth daily as needed for muscle spasms. Notes to patient: As needed   warfarin 1 MG tablet Commonly known as: COUMADIN Take 2 tablets (2 mg total) by mouth See admin instructions. Take 2 tablets (2mg ) by mouth on on Tuesdays and Thursdays. Take 1 tablet (5mg ) by mouth on Sunday, Monday, Wednesday, Friday and Saturday What changed:   how much to take  additional instructions Notes to patient: Take tomorrow evening (Tuesday)   warfarin 5 MG tablet Commonly known as: COUMADIN Take 1 tablet (5 mg total) by mouth See admin instructions. Take 1 tablet (5mg) by mouth on Sunday, Monday, Wednesday, Friday and Saturday. Take 2mg on Tuesdays and Thursdays What changed: additional instructions Notes to patient: This this evening (Wednesday)            Durable Medical Equipment  (From admission, onward)         Start     Ordered   10/05/19 0739  For home use only DME 3 n 1  Once     02 /09/21 0739         Activity: activity as tolerated Diet: regular diet Wound Care: keep wound clean and dry  Follow-up with Dr. Oneida Alar in 3 weeks.  Signed: Dagoberto Ligas, PA-C 10/13/2019 3:05 PM [facility] Office: [phone]

## 2019-10-13 NOTE — Progress Notes (Signed)
ANTICOAGULATION CONSULT NOTE  Pharmacy Consult:  Heparin / Coumadin Indication: L-leg embolus/AFib  No Known Allergies  Patient Measurements: Height: 5\' 6"  (167.6 cm) Weight: 116 lb 6.5 oz (52.8 kg) IBW/kg (Calculated) : 59.3 Heparin Dosing Weight: 57 kg  Vital Signs: Temp: 98.1 F (36.7 C) (02/17 0814) Temp Source: Oral (02/17 0814) BP: 107/61 (02/17 0814) Pulse Rate: 72 (02/17 0814)  Labs: Recent Labs    10/11/19 0305 10/11/19 0305 10/12/19 0335 10/13/19 0332  HGB 9.3*   < > 10.1* 9.2*  HCT 29.8*  --  33.2* 29.6*  PLT 193  --  243 222  LABPROT 18.0*  --  19.1* 22.9*  INR 1.5*  --  1.6* 2.0*  HEPARINUNFRC 0.67  --  0.41 0.58  CREATININE  --   --  0.98  --    < > = values in this interval not displayed.    Estimated Creatinine Clearance: 41.3 mL/min (by C-G formula based on SCr of 0.98 mg/dL).  Assessment: 41 YOF with history of Afib on Coumadin PTA.  Patient presented with acute leg pain and left leg embolus and s/p embolectomy on 10/01/19.  On 10/03/19, patient required evacuation of hematoma.  Pharmacy consulted for IV heparin dosing.  Coumadin was resumed on 10/05/19.  Of note, the newly resumed amiodarone may increase the anticoagulant effect of Coumadin but effect is likely seen within the first week or two of initiation. Plans noted for discharge -INR= 2   Home Coumadin regimen: 4 mg daily except 5 mg on Tues / Thurs  Goal of Therapy:  Heparin level 0.3-0.7 units/mL, aim for lower end due to recent bleeding INR 2-3 Monitor platelets by anticoagulation protocol: Yes    Plan: -With addition of amiodarone, would consider sending home on 5mg  daily except take 2mg  on Tuesday and Thursday (she has 1mg  tablets at home)   Hildred Laser, PharmD Clinical Pharmacist **Pharmacist phone directory can now be found on [email or web address] ([email or web address]).  Listed under [place].

## 2019-10-13 NOTE — TOC Transition Note (Addendum)
Transition of Care [facility]) - CM/SW Discharge Note Marvetta Gibbons RN, BSN Transitions of Care Unit 4E- RN Case Manager [phone]   Patient Details  Name: Angela [name] MRN: [id number] Date of Birth: 23-Nov-1945  Transition of Care [facility]) CM/SW Contact:  Dawayne Patricia, RN Phone Number: 10/13/2019, 11:14 AM   Clinical Narrative:    Pt stable for transition home today, [facility] services have been set up with [facility] per pt/son choice- Butch Penny with [facility] has been notified for start of care. Zach with [employer] notified for DME need- 3n1 to be delivered to the room prior to discharge- bedside RN verified plan for INR checks- pt will go to PCP for INR check next week. Per conversation with son PPC was following INR prior to admission.  Son to provided transportation home.  1130- notified by Thedore Mins with [employer]- pt received [facility] December 08 2018- her insurance will not cover 3n1 at this time- out of pocket cost will be $75 if family wishes to have 22n1- spoke with pt's son- who reports that he knows where [facility] is at and they will use it- he does not wish to pay out of pocket for 3n1  Final next level of care: Home w Home Health Services Barriers to Discharge: Barriers Resolved   Patient Goals and CMS Choice Patient states their goals for this hospitalization and ongoing recovery are:: return home with family CMS Medicare.gov Compare Post Acute Care list provided to:: Patient Represenative (must comment)(son) Choice offered to / list presented to : Adult Children  Discharge Placement               Home with [facility]        Discharge Plan and Services   Discharge Planning Services: CM Consult Post Acute Care Choice: Home Health, Durable Medical Equipment          DME Arranged: 3-N-1 DME Agency: [facility] Date DME Agency Contacted: 10/13/19 Time DME Agency Contacted: 0900 Representative spoke with at DME Agency: Thedore Mins HH Arranged: PT, OT [place] Agency: [place] ([place]) Date [place]:  10/13/19 Time [place]: 57 Representative spoke with at [place]: [place] ([place]) Interventions     Readmission Risk Interventions Readmission Risk Prevention Plan 10/13/2019 01/19/2019 12/05/2018  Transportation Screening Complete Complete -  PCP or Specialist Appt within 3-5 Days - - -  [place] or [place] - Complete Complete  Social Work Consult for [place] Planning/Counseling - Complete Complete  Palliative Care Screening - Complete -  Medication Review Press photographer) Complete Complete -  [place] or Home Care Consult Complete - -  SW Recovery Care/Counseling Consult Complete - -  Palliative Care Screening Not Applicable - -  [place] Not Applicable - -  Some recent data might be hidden

## 2019-10-13 NOTE — Progress Notes (Signed)
D/C instructions given to pt and son [name]. Medications and wound care reviewed. All questions answered. IV removed, clean and intact. Son to escort pt home.  Clyde Canterbury, RN

## 2019-10-13 NOTE — Progress Notes (Signed)
Progress Note  Patient Name: Angela [name] Date of Encounter: 10/13/2019  Primary Cardiologist: Cristopher Peru, MD   Subjective   Feeling well.  Excited to go home.  Without complaint this AM.   Inpatient Medications    Scheduled Meds: . amiodarone  200 mg Oral BID  . carvedilol  3.125 mg Oral BID WC  . Chlorhexidine Gluconate Cloth  6 each Topical Daily  . diclofenac Sodium  2 g Topical QID  . digoxin  125 mcg Oral Daily  . docusate sodium  100 mg Oral Daily  . furosemide  20 mg Oral Daily  . midodrine  10 mg Oral TID WC  . pantoprazole  40 mg Oral Daily  . polyethylene glycol  17 g Oral Daily  . Warfarin - Pharmacist Dosing Inpatient   Does not apply q1800   Continuous Infusions: . sodium chloride    . heparin Stopped (10/13/19 0858)  . magnesium sulfate bolus IVPB     PRN Meds: sodium chloride, acetaminophen **OR** acetaminophen, alum & mag hydroxide-simeth, bisacodyl, guaiFENesin-dextromethorphan, hydrALAZINE, HYDROmorphone (DILAUDID) injection, labetalol, magnesium sulfate bolus IVPB, metoprolol tartrate, ondansetron, oxyCODONE, phenol, senna-docusate, tiZANidine   Vital Signs    Vitals:   10/12/19 2030 10/13/19 0040 10/13/19 0425 10/13/19 0814  BP: 94/64 96/64 95/62  107/61  Pulse: 65 77 74 72  Resp:   15 18  Temp: (!) 97.4 F (36.3 C) 97.6 F (36.4 C) 97.8 F (36.6 C) 98.1 F (36.7 C)  TempSrc: Oral Oral Oral Oral  SpO2: 97% 95% 96% 99%  Weight:   52.8 kg   Height:   5\' 6"  (1.676 m)     Intake/Output Summary (Last 24 hours) at 10/13/2019 0923 Last data filed at 10/13/2019 0600 Gross per 24 hour  Intake 944.54 ml  Output 250 ml  Net 694.54 ml   Last 3 Weights 10/13/2019 10/12/2019 10/10/2019  Weight (lbs) 116 lb 6.5 oz 127 lb 13.9 oz 132 lb 0.9 oz  Weight (kg) 52.8 kg 58 kg 59.9 kg      Telemetry    Afib.  VP.  PVCs.  19 beats NSVT.   Personally Reviewed  ECG    n/a - Personally Reviewed  Physical Exam   VS:  BP 107/61 (BP Location: Right Arm)    Pulse 72   Temp 98.1 F (36.7 C) (Oral)   Resp 18   Ht 5\' 6"  (1.676 m)   Wt 52.8 kg   SpO2 99%   BMI 18.79 kg/m  , BMI Body mass index is 18.79 kg/m. GENERAL:  Frail.  No acute distress HEENT: Pupils equal round and reactive, fundi not visualized, oral mucosa unremarkable NECK:  No jugular venous distention, waveform within normal limits, carotid upstroke brisk and symmetric, no bruits LUNGS:  Clear to auscultation bilaterally HEART:  Mostly regular.  PMI not displaced or sustained, Mechanical S1.  S2 within normal limits, no S3, no S4, no clicks, no rubs, no murmurs ABD:  Flat, positive bowel sounds normal in frequency in pitch, no bruits, no rebound, no guarding, no midline pulsatile mass, no hepatomegaly, no splenomegaly EXT:  2 plus pulses throughout, no edema, no cyanosis no clubbing SKIN:  No rashes no nodules NEURO:  Cranial nerves II through XII grossly intact, motor grossly intact throughout PSYCH:  Oriented to person   Labs    High Sensitivity Troponin:   Recent Labs  Lab 09/29/19 0131 09/29/19 0402  TROPONINIHS 176* 163*      Chemistry Recent Labs  Lab 10/10/19  0300 10/12/19 0335  NA 138 140  K 3.2* 3.5  CL 101 100  CO2 29 26  GLUCOSE 107* 114*  BUN 16 15  CREATININE 0.79 0.98  CALCIUM 9.1 9.5  GFRNONAA >60 56*  GFRAA >60 >60  ANIONGAP 8 14     Hematology Recent Labs  Lab 10/11/19 0305 10/12/19 0335 10/13/19 0332  WBC 4.2 4.9 3.9*  RBC 3.06* 3.34* 3.03*  HGB 9.3* 10.1* 9.2*  HCT 29.8* 33.2* 29.6*  MCV 97.4 99.4 97.7  MCH 30.4 30.2 30.4  MCHC 31.2 30.4 31.1  RDW 17.2* 16.9* 16.8*  PLT 193 243 222    BNPNo results for input(s): BNP, PROBNP in the last 168 hours.   DDimer No results for input(s): DDIMER in the last 168 hours.   Radiology    No results found.  Cardiac Studies   Echo 10/27/18: IMPRESSIONS   1. The left ventricle has a visually estimated ejection fraction of of  15-20%. The cavity size was severely dilated. Left  ventricular diastolic  Doppler parameters are indeterminate due to mitral valve  replacement/repair. Left ventricular diffuse  hypokinesis.  2. The right ventricle has moderately reduced systolic function. The  cavity was normal. There is no increase in right ventricular wall  thickness.  3. Left atrial size was severely dilated.  4. Right atrial size was severely dilated.  5. A mechanical valve is present in the mitral position. Procedure Date:  1983.  6. Bjork-Shiley mechanical mitral valve prosthesis functioning properly.  7. The tricuspid valve is normal in structure.  8. The aortic valve is normal in structure.  9. The pulmonic valve was normal in structure. Pulmonic valve  regurgitation is mild by color flow Doppler.  10. The inferior vena cava was normal in size with <50% respiratory  variability.   Patient Profile     74 y.o. female with chronic systolic diastolic heart failure (LVEF 15 to 20%), status post mechanical mitral valve (Bjork-Shiley), status post [facility] biventricular ICD, prior cardiac arrest, VT, chronic atrial fibrillation admitted with peripheral arterial embolism.  Assessment & Plan    # Arterial embolism: # Mechanical mitral valve:  Underwent embolectomy up to the left popliteal artery on 10/01/2019.  This occurred in the setting of subtherapeutic INR.  INR now up to 2.  Goal is 2-3.  Stop heparin.  # Chronic systolic and diastolic heart failure: She appears euvolemic on oral Lasix. Continue digoxin and carvedilol.  # Chronic atrial fibrillation:   Heart rate is well controlled on carvedilol, digoxin and amiodarone.  However multiple doses of carvedilol have been held due to hypotension.  This is being used for rate control.  Increased midodrine 2/15 and she has received two consecutive doses of   # Hypotension:  She is currently on midodrine.  Dose was increased to 3 times daily on 2/15.  BP and dizziness improved.  # Chest pain:  She also  reports chest discomfort responsive to palpation.  We will order Voltaren gel.  # VT:  Stable.  Continue carvedilol and amiodarone.     # Dementia:  Patient confused and frustrated about being here.  Called to update son and left a VM.  For questions or updates, please contact [place] Please consult [email or web address] for contact info under        Signed, Skeet Latch, MD  10/13/2019, 9:23 AM

## 2019-10-14 ENCOUNTER — Other Ambulatory Visit: Payer: Self-pay

## 2019-10-14 ENCOUNTER — Other Ambulatory Visit: Payer: Medicare Other | Admitting: Hospice

## 2019-10-14 ENCOUNTER — Encounter: Payer: Medicare Other | Admitting: Student

## 2019-10-14 DIAGNOSIS — I743 Embolism and thrombosis of arteries of the lower extremities: Secondary | ICD-10-CM

## 2019-10-14 DIAGNOSIS — Z515 Encounter for palliative care: Secondary | ICD-10-CM

## 2019-10-14 NOTE — Progress Notes (Signed)
[facility] [place] [facility] Consult Note Telephone: [phone]  Fax: [phone]  PATIENT NAME: Angela [name] DOB: 1946/03/25 MRN: [id number]  PRIMARY CARE PROVIDER:   Lorene Dy, MD  REFERRING PROVIDER:  Lorene Dy, [place] [place], [place],  [place] [id number]  RESPONSIBLE PARTY:     Kalin Petito, husband [phone]  TELEHEALTH VISIT STATEMENT Due to the COVID-19 crisis, this visit was done via telephone from my office. It was initiated and consented to by this patient and/or family.  RECOMMENDATIONS/PLAN:   Advance Care Planning/Goals of Care: Telehealth Visit consisted of building trust and discussions on Palliative Medicine as specialized medical care for people living with serious illness, aimed at facilitating better quality of life through symptoms relief, assisting with advance care plan and establishing goals of care. [name] affirmed patient is a full code. Goals of care include to maximize quality of life and symptom management. Symptom management: Patient was admitted to [facility] 10/01/2019 for arterial occlusion, successfully underwent thromboembolectomy of left lower extremity; returned to the operating room on 10/03/2019 for evacuation of left calf hematoma and placement of JP drain, dcd home 10/13/2019. She is doing well, no complaint of pain/discomfort, no SOB. She is ambulatory with a cane. Home health PT/OT ordered from hospital. She will follow-up with Dr. Oneida Alar on 10/28/2019 for left popliteal incision check. F/u with PCP scheduled for 10/22/2019 per spouse. Memory loss related to Dementia is at baseline. FAST 6a. She is compliant with her medication; in no acute distress. Follow up: Palliative care will continue to follow patient for goals of care clarification and symptom management. I spent 46 minutes providing this consultation; time includes chart review and documentation. More than 50% of the time in this consultation was spent on  coordinating communication.    HISTORY OF PRESENT ILLNESS:  Angela [name] is a 74 y.o. year old female with multiple medical problems including dementia, afib on coumadin, cardiomyopathy with EF 15-20%, CHF, arthritis. Palliative Care was asked to help address goals of care.  CODE STATUS: Full  PPS: 40% HOSPICE ELIGIBILITY/DIAGNOSIS: TBD  PAST MEDICAL HISTORY:  Past Medical History:  Diagnosis Date  . Abnormality of gait 08/23/2014  . Arthritis   . Atrial fibrillation ([place])    A.  Chronic Coumadin  . Cardiac arrest - ventricular fibrillation    A.  12/2007;  B. 01/2008 St. Jude Promote Bi-V ICD placed  . CVA (cerebral vascular accident) ([place])   . Diverticular disease   . DVT (deep venous thrombosis) ([place])   . 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 History of colon cancer    A.  1999 - T3, N1  chemotherapy  . Internal hemorrhoids   . Memory deficit 08/23/2014  . Nonischemic cardiomyopathy ([place])    a.  11/2001 - Cath - NL Cors; b. ECHO 09/22/11: EF 20%, MVR normal, moderate LAE, mild RAE c. ECHO (08/2012): ED 20%, diff HK, LA mod dilated, mild/mod TR, RV mild/mod decreased sys fx  . Rectal bleeding 04/28/2017  . Rheumatic heart disease    A. 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 Sick sinus syndrome ([place])    A.  s/p pacer in 1995.  B.    . Systolic CHF, chronic ([place])    A.  01/2008 Echo - EF 10-20%  . UTI (urinary tract infection) 01/19/2019    SOCIAL HX:  Social History   Tobacco Use  .  Smoking status: Never Smoker  . Smokeless tobacco: Never Used  Substance Use Topics  . Alcohol use: No    ALLERGIES: No Known Allergies   PERTINENT MEDICATIONS:  Outpatient Encounter Medications as of 10/14/2019  Medication Sig  . alendronate (FOSAMAX) 70 MG tablet Take 70 mg by mouth once a week.  [name] amiodarone (PACERONE) 200 MG tablet Take 1 tablet (200 mg total) by mouth 2 (two) times daily for 10 days.  Derrill Memo ON 10/23/2019]  amiodarone (PACERONE) 200 MG tablet Take 1 tablet (200 mg total) by mouth daily.  . carvedilol (COREG) 3.125 MG tablet Take 1 tablet (3.125 mg total) by mouth 2 (two) times daily with a meal.  . digoxin (LANOXIN) 0.125 MG tablet Take 125 mcg by mouth daily.  . furosemide (LASIX) 20 MG tablet Take 20 mg by mouth daily.  . midodrine (PROAMATINE) 10 MG tablet Take 1 tablet (10 mg total) by mouth 3 (three) times daily.  . Multiple Vitamins-Minerals (AIRBORNE GUMMIES PO) Take 1 tablet by mouth daily.  [name] oxyCODONE (OXY IR/ROXICODONE) 5 MG immediate release tablet Take 1 tablet (5 mg total) by mouth every 6 (six) hours as needed for moderate pain.  . potassium chloride (K-DUR) 10 MEQ tablet Take 1 tablet (10 mEq total) by mouth daily for 30 days. (Patient not taking: Reported on 10/01/2019)  . tiZANidine (ZANAFLEX) 4 MG tablet Take 4 mg by mouth daily as needed for muscle spasms.   [name] warfarin (COUMADIN) 1 MG tablet Take 2 tablets (2 mg total) by mouth See admin instructions. Take 2 tablets (2mg ) by mouth on on Tuesdays and Thursdays. Take 1 tablet (5mg ) by mouth on Sunday, Monday, Wednesday, Friday and Saturday  . warfarin (COUMADIN) 5 MG tablet Take 1 tablet (5 mg total) by mouth See admin instructions. Take 1 tablet (5mg ) by mouth on Sunday, Monday, Wednesday, Friday and Saturday. Take 2mg  on Tuesdays and Thursdays   No facility-administered encounter medications on file as of 10/14/2019.    Teodoro Spray, NP

## 2019-10-25 ENCOUNTER — Encounter: Payer: Medicare Other | Admitting: Student

## 2019-10-27 ENCOUNTER — Telehealth (HOSPITAL_COMMUNITY): Payer: Self-pay

## 2019-10-27 NOTE — Telephone Encounter (Signed)

## 2019-10-28 ENCOUNTER — Ambulatory Visit (INDEPENDENT_AMBULATORY_CARE_PROVIDER_SITE_OTHER): Payer: Self-pay | Admitting: Vascular Surgery

## 2019-10-28 ENCOUNTER — Encounter: Payer: Self-pay | Admitting: Vascular Surgery

## 2019-10-28 ENCOUNTER — Other Ambulatory Visit: Payer: Self-pay

## 2019-10-28 VITALS — BP 105/66 | HR 73 | Temp 97.4°F | Resp 20 | Ht 66.0 in | Wt 116.0 lb

## 2019-10-28 DIAGNOSIS — I749 Embolism and thrombosis of unspecified artery: Secondary | ICD-10-CM

## 2019-10-28 NOTE — Progress Notes (Signed)
Patient is a 74 year old female who returns for postoperative follow-up today.  He underwent left popliteal and tibial thrombectomy with vein patch angioplasty October 01, 2019.  This was thought to be secondary to an embolic event from atrial fibrillation.  She is currently on warfarin.  He had a palpable dorsalis pedis pulse at the time of discharge October 14, 2019.  Of note she previously had a right femoral embolectomy in 2003 by Dr Kellie Simmering.  She apparently had her INR checked last week and was told no medication changes.  She is scheduled for another appointment next week.  Past Medical History:  Diagnosis Date  . Abnormality of gait 08/23/2014  . Arthritis   . Atrial fibrillation ([place])    A.  Chronic Coumadin  . Cardiac arrest - ventricular fibrillation    A.  12/2007;  B. 01/2008 St. Jude Promote Bi-V ICD placed  . CVA (cerebral vascular accident) ([place])   . Diverticular disease   . DVT (deep venous thrombosis) ([place])   . 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 History of colon cancer    A.  1999 - T3, N1  chemotherapy  . Internal hemorrhoids   . Memory deficit 08/23/2014  . Nonischemic cardiomyopathy ([place])    a.  11/2001 - Cath - NL Cors; b. ECHO 09/22/11: EF 20%, MVR normal, moderate LAE, mild RAE c. ECHO (08/2012): ED 20%, diff HK, LA mod dilated, mild/mod TR, RV mild/mod decreased sys fx  . Rectal bleeding 04/28/2017  . Rheumatic heart disease    A. 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 Sick sinus syndrome ([place])    A.  s/p pacer in 1995.  B.    . Systolic CHF, chronic ([place])    A.  01/2008 Echo - EF 10-20%  . UTI (urinary tract infection) 01/19/2019     Current Outpatient Medications on File Prior to Visit  Medication Sig Dispense Refill  . alendronate (FOSAMAX) 70 MG tablet Take 70 mg by mouth once a week.    [name] amiodarone (PACERONE) 200 MG tablet Take 1 tablet (200 mg total) by mouth 2 (two) times daily for 10 days. 19  tablet 0  . amiodarone (PACERONE) 200 MG tablet Take 1 tablet (200 mg total) by mouth daily. 30 tablet 3  . carvedilol (COREG) 3.125 MG tablet Take 1 tablet (3.125 mg total) by mouth 2 (two) times daily with a meal. 180 tablet 3  . digoxin (LANOXIN) 0.125 MG tablet Take 125 mcg by mouth daily.    . furosemide (LASIX) 20 MG tablet Take 20 mg by mouth daily.    . midodrine (PROAMATINE) 10 MG tablet Take 1 tablet (10 mg total) by mouth 3 (three) times daily. 90 tablet 1  . Multiple Vitamins-Minerals (AIRBORNE GUMMIES PO) Take 1 tablet by mouth daily.    [name] oxyCODONE (OXY IR/ROXICODONE) 5 MG immediate release tablet Take 1 tablet (5 mg total) by mouth every 6 (six) hours as needed for moderate pain. 20 tablet 0  . potassium chloride (K-DUR) 10 MEQ tablet Take 1 tablet (10 mEq total) by mouth daily for 30 days. (Patient not taking: Reported on 10/01/2019) 30 tablet 0  . tiZANidine (ZANAFLEX) 4 MG tablet Take 4 mg by mouth daily as needed for muscle spasms.     [name] warfarin (COUMADIN) 1 MG tablet Take 2 tablets (2 mg total) by mouth See admin instructions. Take 2 tablets (2mg ) by  mouth on on Tuesdays and Thursdays. Take 1 tablet (5mg ) by mouth on Sunday, Monday, Wednesday, Friday and Saturday    . warfarin (COUMADIN) 5 MG tablet Take 1 tablet (5 mg total) by mouth See admin instructions. Take 1 tablet (5mg ) by mouth on Sunday, Monday, Wednesday, Friday and Saturday. Take 2mg  on Tuesdays and Thursdays 30 tablet 2   No current facility-administered medications on file prior to visit.    Past Medical History:  Diagnosis Date  . Abnormality of gait 08/23/2014  . Arthritis   . Atrial fibrillation ([place])    A.  Chronic Coumadin  . Cardiac arrest - ventricular fibrillation    A.  12/2007;  B. 01/2008 St. Jude Promote Bi-V ICD placed  . CVA (cerebral vascular accident) ([place])   . Diverticular disease   . DVT (deep venous thrombosis) ([place])   . 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  12/2001  iliofemoral embolus s/p r fem embolectomy  . History of colon cancer    A.  1999 - T3, N1  chemotherapy  . Internal hemorrhoids   . Memory deficit 08/23/2014  . Nonischemic cardiomyopathy ([place])    a.  11/2001 - Cath - NL Cors; b. ECHO 09/22/11: EF 20%, MVR normal, moderate LAE, mild RAE c. ECHO (08/2012): ED 20%, diff HK, LA mod dilated, mild/mod TR, RV mild/mod decreased sys fx  . Rectal bleeding 04/28/2017  . Rheumatic heart disease    A. 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 Sick sinus syndrome ([place])    A.  s/p pacer in 1995.  B.    . Systolic CHF, chronic ([place])    A.  01/2008 Echo - EF 10-20%  . UTI (urinary tract infection) 01/19/2019    Physical exam:  Vitals:   10/28/19 1118  BP: 105/66  Pulse: 73  Resp: 20  Temp: (!) 97.4 F (36.3 C)  SpO2: 99%  Weight: 116 lb (52.6 kg)  Height: 5\' 6"  (1.676 m)    Both feet warm symmetric no palpable pedal pulses bilaterally  Incision healing left leg trace edema  Assessment: Doing well status post popliteal tibial embolectomy left leg.  Viable feet bilaterally.  Plan: Emphasized to patient and her son the importance of continuing her warfarin and having her levels checked since she has now had 2 embolectomies.  Patient still has some swelling in the leg.  I discussed with the son that it is still swollen 6 to 8 weeks she can place a compression stocking on this leg.  Patient will follow up with me on as-needed basis.  Ruta Hinds, MD [facility] Office: [phone]

## 2019-10-28 NOTE — Progress Notes (Signed)
Electrophysiology Office Note Date: 10/29/2019  ID:  Angela [name], DOB 02-Aug-1946, MRN [id number]  PCP: Lorene Dy, MD Primary Cardiologist: Cristopher Peru, MD  CC: Routine ICD follow-up  Angela [name] is a 74 y.o. female seen today for Dr. Lovena Le for management of her VT, CHB, chronic AF, and chronic systolic CHF. They present today for routine electrophysiology followup.  Since last being seen in our clinic, the patient reports doing well. She recently had an admission for arterial occlusion in her leg requiring thromboembolectomy and then subsequent evacuation of hematoma. She denies chest pain, palpitations, dyspnea, PND, orthopnea, nausea, vomiting, dizziness, syncope, edema, weight gain, or early satiety.  She has not had ICD shocks.   Device History: St. Jude BiV ICD implanted 01/2008, gen change 08/2014 for VF/Chronic systolic CHF History of appropriate therapy: Yes History of AAD therapy: Yes   Past Medical History:  Diagnosis Date  . Abnormality of gait 08/23/2014  . Arthritis   . Atrial fibrillation ([place])    A.  Chronic Coumadin  . Cardiac arrest - ventricular fibrillation    A.  12/2007;  B. 01/2008 St. Jude Promote Bi-V ICD placed  . CVA (cerebral vascular accident) ([place])   . Diverticular disease   . DVT (deep venous thrombosis) ([place])   . 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 History of colon cancer    A.  1999 - T3, N1  chemotherapy  . Internal hemorrhoids   . Memory deficit 08/23/2014  . Nonischemic cardiomyopathy ([place])    a.  11/2001 - Cath - NL Cors; b. ECHO 09/22/11: EF 20%, MVR normal, moderate LAE, mild RAE c. ECHO (08/2012): ED 20%, diff HK, LA mod dilated, mild/mod TR, RV mild/mod decreased sys fx  . Rectal bleeding 04/28/2017  . Rheumatic heart disease    A. 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 Sick sinus syndrome ([place])    A.  s/p pacer in 1995.  B.    . Systolic CHF, chronic ([place])    A.  01/2008  Echo - EF 10-20%  . UTI (urinary tract infection) 01/19/2019   Past Surgical History:  Procedure Laterality Date  . ABDOMINAL HYSTERECTOMY    . AV NODE ABLATION N/A 09/23/2011   Procedure: AV NODE ABLATION;  Surgeon: Evans Lance, MD;  Location: [facility] CATH LAB;  Service: Cardiovascular;  Laterality: N/A;  . BIV ICD GENERTAOR CHANGE OUT N/A 09/05/2014   Procedure: BIV ICD GENERTAOR CHANGE OUT;  Surgeon: Evans Lance, MD;  Location[facility] CATH LAB;  Service: Cardiovascular;  Laterality: N/A;  . Biventricular AICD    . COLON SURGERY  1999  . EMBOLECTOMY Left 10/01/2019   Procedure: Left Popliteal Artery Thrombectomy with Vein Patch Angioplasty; Left Tibial Artery Thrombectomy;  Surgeon: Elam Dutch, MD;  Location: [facility] OR;  Service: Vascular;  Laterality: Left;  . History of echocardiogram  2003, 2007, 2009  . I & D EXTREMITY Left 10/03/2019   Procedure: EVACUATION OF HEMATOMA  LEFT LEG;  Surgeon: Elam Dutch, MD;  Location: [facility];  Service: Vascular;  Laterality: Left;  . INCISION AND DRAINAGE PERIRECTAL ABSCESS Right 11/28/2018   Procedure: IRRIGATION AND DEBRIDEMENT PERIRECTAL ABSCESS;  Surgeon: Greer Pickerel, MD;  Location: [facility];  Service: General;  Laterality: Right;  . Mitral Valve Replacement, Bjork-Shiley valve  1983    Current Outpatient Medications  Medication Sig Dispense Refill  . alendronate (FOSAMAX) 70  MG tablet Take 70 mg by mouth once a week.    . ALPRAZolam (XANAX) 0.25 MG tablet Take 0.25 mg by mouth at bedtime as needed.    [name] amiodarone (PACERONE) 200 MG tablet Take 1 tablet (200 mg total) by mouth daily. 30 tablet 3  . carvedilol (COREG) 3.125 MG tablet Take 1 tablet (3.125 mg total) by mouth 2 (two) times daily with a meal. 180 tablet 3  . digoxin (LANOXIN) 0.125 MG tablet Take 125 mcg by mouth daily.    . furosemide (LASIX) 20 MG tablet Take 20 mg by mouth daily.    . midodrine (PROAMATINE) 10 MG tablet Take 1 tablet (10 mg total) by mouth 3 (three) times daily. 90  tablet 1  . Multiple Vitamins-Minerals (AIRBORNE GUMMIES PO) Take 1 tablet by mouth daily.    [name] oxyCODONE (OXY IR/ROXICODONE) 5 MG immediate release tablet Take 1 tablet (5 mg total) by mouth every 6 (six) hours as needed for moderate pain. 20 tablet 0  . tiZANidine (ZANAFLEX) 4 MG tablet Take 4 mg by mouth daily as needed for muscle spasms.     [name] warfarin (COUMADIN) 1 MG tablet Take 2 tablets (2 mg total) by mouth See admin instructions. Take 2 tablets (2mg ) by mouth on on Tuesdays and Thursdays. Take 1 tablet (5mg ) by mouth on Sunday, Monday, Wednesday, Friday and Saturday (Patient taking differently: Take 2 mg by mouth See admin instructions. Take 2 tmg) by mouth on Tuesday. Take 1 tablet (5mg ) by mouth on Sunday, Monday, Wednesday,Friday, and Saturday.)    . warfarin (COUMADIN) 5 MG tablet Take 1 tablet (5 mg total) by mouth See admin instructions. Take 1 tablet (5mg ) by mouth on Sunday, Monday, Wednesday, Friday and Saturday. Take 2mg  on Tuesdays and Thursdays 30 tablet 2   No current facility-administered medications for this visit.    Allergies:   Patient has no known allergies.   Social History: Social History   Socioeconomic History  . Marital status: Married    Spouse name: Not on file  . Number of children: 1  . Years of education: Not on file  . Highest education level: Not on file  Occupational History  . Occupation: Retired    Fish farm manager: RETIRED  Tobacco Use  . Smoking status: Never Smoker  . Smokeless tobacco: Never Used  Substance and Sexual Activity  . Alcohol use: No  . Drug use: No  . Sexual activity: Not Currently  Other Topics Concern  . Not on file  Social History Narrative   The patient is married and has 1 child.  Lives in [place] with husband.  She is retired from working at [employer].  She does not smoke or drink   Social Determinants of Health   Financial Resource Strain:   . Difficulty of Paying Living Expenses: Not on file  Food Insecurity:    . Worried About Charity fundraiser in the Last Year: Not on file  . Ran Out of Food in the Last Year: Not on file  Transportation Needs:   . Lack of Transportation (Medical): Not on file  . Lack of Transportation (Non-Medical): Not on file  Physical Activity:   . Days of Exercise per Week: Not on file  . Minutes of Exercise per Session: Not on file  Stress:   . Feeling of Stress : Not on file  Social Connections:   . Frequency of Communication with Friends and Family: Not on file  . Frequency of Social  Gatherings with Friends and Family: Not on file  . Attends Religious Services: Not on file  . Active Member of Clubs or Organizations: Not on file  . Attends Archivist Meetings: Not on file  . Marital Status: Not on file  Intimate Partner Violence:   . Fear of Current or Ex-Partner: Not on file  . Emotionally Abused: Not on file  . Physically Abused: Not on file  . Sexually Abused: Not on file    Family History: Family History  Problem Relation Age of Onset  . Diabetes Mother 35  . Coronary artery disease Mother        s/p cabg  . Lung cancer Father 40       smoker  . Coronary artery disease Sister        s/p cabg    Review of Systems: All other systems reviewed and are otherwise negative except as noted above.   Physical Exam: Vitals:   10/29/19 1204  BP: 120/80  Pulse: (!) 54  SpO2: 98%  Weight: 134 lb (60.8 kg)  Height: 5\' 6"  (1.676 m)     GEN- The patient is well appearing, alert and oriented x 3 today.   HEENT: normocephalic, atraumatic; sclera clear, conjunctiva pink; hearing intact; oropharynx clear; neck supple, no JVP Lymph- no cervical lymphadenopathy Lungs- Clear to ausculation bilaterally, normal work of breathing.  No wheezes, rales, rhonchi Heart- Regular rate and rhythm, no murmurs, rubs or gallops, PMI not laterally displaced GI- soft, non-tender, non-distended, bowel sounds present, no hepatosplenomegaly Extremities- no clubbing,  cyanosis, or edema; DP/PT/radial pulses 2+ bilaterally MS- no significant deformity or atrophy Skin- warm and dry, no rash or lesion; ICD pocket well healed Psych- euthymic mood, full affect Neuro- strength and sensation are intact  ICD interrogation- reviewed in detail today,  See PACEART report  EKG:  EKG is not ordered today. The ekg ordered 09/29/2019 shows Noisy baseline, V paced rhythm with underlying AF likely. Rate approximately 75 bpm  Recent Labs: 01/19/2019: ALT 27 01/22/2019: TSH 2.338 09/29/2019: B Natriuretic Peptide 611.2 10/12/2019: BUN 15; Creatinine, Ser 0.98; Magnesium 1.9; Potassium 3.5; Sodium 140 10/13/2019: Hemoglobin 9.2; Platelets 222   Wt Readings from Last 3 Encounters:  10/29/19 134 lb (60.8 kg)  10/28/19 116 lb (52.6 kg)  10/13/19 116 lb 6.5 oz (52.8 kg)     Other studies Reviewed: Additional studies/ records that were reviewed today include: Recent admission records, Previous EP office notes, Previous remotes, most recent labwork.    Assessment and Plan:  1.  Chronic systolic dysfunction s/p St. Jude CRT-D  euvolemic today Stable on an appropriate medical regimen Normal ICD function See Pace Art report No changes today  2. Atrial fibrillation Rates are controlled She is on coumadin for mechanical MV  3. VT Continue amiodarone 200 mg daily  4. Recent arterial occlusion, LLE Healing well from recent surgery. Following with vascular.    Current medicines are reviewed at length with the patient today.   The patient does not have concerns regarding her medicines.  The following changes were made today:  none  Labs/ tests ordered today include:  Orders Placed This Encounter  Procedures  . CUP PACEART INCLINIC DEVICE CHECK   Disposition:   Follow up with Dr. Lovena Le in  6 months  Signed, Shirley Friar, PA-C  10/29/2019 12:46 PM  [address] [place] [address] [phone]  (office) [phone] (fax)

## 2019-10-29 ENCOUNTER — Ambulatory Visit (INDEPENDENT_AMBULATORY_CARE_PROVIDER_SITE_OTHER): Payer: Medicare Other | Admitting: Student

## 2019-10-29 ENCOUNTER — Encounter: Payer: Self-pay | Admitting: Student

## 2019-10-29 VITALS — BP 120/80 | HR 54 | Ht 66.0 in | Wt 134.0 lb

## 2019-10-29 DIAGNOSIS — I4821 Permanent atrial fibrillation: Secondary | ICD-10-CM | POA: Diagnosis not present

## 2019-10-29 DIAGNOSIS — I5022 Chronic systolic (congestive) heart failure: Secondary | ICD-10-CM | POA: Diagnosis not present

## 2019-10-29 DIAGNOSIS — I472 Ventricular tachycardia, unspecified: Secondary | ICD-10-CM

## 2019-10-29 DIAGNOSIS — I428 Other cardiomyopathies: Secondary | ICD-10-CM | POA: Diagnosis not present

## 2019-10-29 LAB — CUP PACEART INCLINIC DEVICE CHECK
Battery Remaining Longevity: 22 mo
Brady Statistic RA Percent Paced: 0 %
Brady Statistic RV Percent Paced: 94 %
Date Time Interrogation Session: 20210305124104
HighPow Impedance: 43.3086
Implantable Lead Implant Date: 20090618
Implantable Lead Implant Date: 20090618
Implantable Lead Location: 753858
Implantable Lead Location: 753860
Implantable Lead Model: 7120
Implantable Pulse Generator Implant Date: 20160111
Lead Channel Impedance Value: 337.5 Ohm
Lead Channel Impedance Value: 450 Ohm
Lead Channel Pacing Threshold Amplitude: 0.375 V
Lead Channel Pacing Threshold Amplitude: 0.5 V
Lead Channel Pacing Threshold Amplitude: 0.5 V
Lead Channel Pacing Threshold Pulse Width: 0.5 ms
Lead Channel Pacing Threshold Pulse Width: 0.5 ms
Lead Channel Pacing Threshold Pulse Width: 0.5 ms
Lead Channel Sensing Intrinsic Amplitude: 8.3 mV
Lead Channel Setting Pacing Amplitude: 2 V
Lead Channel Setting Pacing Amplitude: 2 V
Lead Channel Setting Pacing Pulse Width: 0.5 ms
Lead Channel Setting Pacing Pulse Width: 0.5 ms
Lead Channel Setting Sensing Sensitivity: 0.5 mV
Pulse Gen Serial Number: 7219979

## 2019-10-29 NOTE — Patient Instructions (Addendum)
Medication Instructions:  none *If you need a refill on your cardiac medications before your next appointment, please call your pharmacy*   Lab Work: none If you have labs (blood work) drawn today and your tests are completely normal, you will receive your results only by: [name] MyChart Message (if you have MyChart) OR . A paper copy in the mail If you have any lab test that is abnormal or we need to change your treatment, we will call you to review the results.   Testing/Procedures: none   Follow-Up: At [facility], you and your health needs are our priority.  As part of our continuing mission to provide you with exceptional heart care, we have created designated Provider Care Teams.  These Care Teams include your primary Cardiologist (physician) and Advanced Practice Providers (APPs -  Physician Assistants and Nurse Practitioners) who all work together to provide you with the care you need, when you need it.  Your next appointment:   1 year(s)  The format for your next appointment:   Either In Person or Virtual  Provider:   Dr Lovena Le   Other Instructions Remote monitoring is used to monitor your of ICD from home. This monitoring reduces the number of office visits required to check your device to one time per year. It allows [place] to keep an eye on the functioning of your device to ensure it is working properly. You are scheduled for a device check from home on 11/02/19. You may send your transmission at any time that day. If you have a wireless device, the transmission will be sent automatically. After your physician reviews your transmission, you will receive a postcard with your next transmission date.

## 2019-11-02 ENCOUNTER — Ambulatory Visit (INDEPENDENT_AMBULATORY_CARE_PROVIDER_SITE_OTHER): Payer: Medicare Other | Admitting: *Deleted

## 2019-11-02 DIAGNOSIS — I428 Other cardiomyopathies: Secondary | ICD-10-CM | POA: Diagnosis not present

## 2019-11-02 LAB — CUP PACEART REMOTE DEVICE CHECK
Battery Remaining Longevity: 23 mo
Battery Remaining Percentage: 26 %
Battery Voltage: 2.93 V
Date Time Interrogation Session: 20210309020015
HighPow Impedance: 43 Ohm
HighPow Impedance: 43 Ohm
Implantable Lead Implant Date: 20090618
Implantable Lead Implant Date: 20090618
Implantable Lead Location: 753858
Implantable Lead Location: 753860
Implantable Lead Model: 7120
Implantable Pulse Generator Implant Date: 20160111
Lead Channel Impedance Value: 360 Ohm
Lead Channel Impedance Value: 450 Ohm
Lead Channel Pacing Threshold Amplitude: 0.375 V
Lead Channel Pacing Threshold Amplitude: 0.5 V
Lead Channel Pacing Threshold Pulse Width: 0.5 ms
Lead Channel Pacing Threshold Pulse Width: 0.5 ms
Lead Channel Sensing Intrinsic Amplitude: 12 mV
Lead Channel Setting Pacing Amplitude: 2 V
Lead Channel Setting Pacing Amplitude: 2 V
Lead Channel Setting Pacing Pulse Width: 0.5 ms
Lead Channel Setting Pacing Pulse Width: 0.5 ms
Lead Channel Setting Sensing Sensitivity: 0.5 mV
Pulse Gen Serial Number: 7219979

## 2019-11-03 NOTE — Progress Notes (Signed)
ICD Remote  

## 2019-11-04 ENCOUNTER — Ambulatory Visit: Payer: Medicare Other

## 2019-11-04 ENCOUNTER — Other Ambulatory Visit: Payer: Self-pay

## 2019-11-04 ENCOUNTER — Other Ambulatory Visit: Payer: Medicare Other | Admitting: Hospice

## 2019-11-04 ENCOUNTER — Telehealth: Payer: Self-pay | Admitting: Hospice

## 2019-11-04 DIAGNOSIS — Z515 Encounter for palliative care: Secondary | ICD-10-CM

## 2019-11-04 DIAGNOSIS — I743 Embolism and thrombosis of arteries of the lower extremities: Secondary | ICD-10-CM

## 2019-11-04 NOTE — Telephone Encounter (Signed)
Called patient  for scheduled telehealth visit and left voicemail. Called again on all phone numbers on file and left voicemails with call back number

## 2019-11-04 NOTE — Progress Notes (Signed)
[facility] Palliative Care Consult Note Telephone: [phone]  Fax: [phone]  PATIENT NAME: Angela [name] DOB: 03-Dec-1945 MRN: [id number]  PRIMARY CARE PROVIDER:   Lorene Dy, MD  REFERRING PROVIDER:  Lorene Dy, MD [address], [place],  [place] [address]   RESPONSIBLE PARTY:Angela [name], [place] [address] [phone]  TELEHEALTH VISIT STATEMENT Due to the COVID-19 crisis, this visit was done via telephone from my office. It was initiated and consented to by this patient and/or family.  RECOMMENDATIONS/PLAN:   Advance Care Planning/Goals of Care: Telehealth Visit consisted of building trust and follow up on palliative care. Angela [name] affirmed patient is a full code. Goals of care include to maximize quality of life and symptom management. Symptom management: Patient regaining her strength and ability to walk. Patient was admitted to [facility] 10/01/2019 for arterial occlusion, successfully underwent thromboembolectomy of left lower extremity; returned to the operating room on 10/03/2019 for evacuation of left calf hematoma and placement of JP drain, dcd home 10/13/2019. PT/OT to discharge patient in a week. Patient reports she is  doing well, no complaint of pain/discomfort, no SOB. She is ambulatory with a cane.  She followed up with  Dr. Oneida Alar on 10/28/2019 for left popliteal incision check; discharged. She is on Coumadin for Afib; seen  In PCP every 2 weeks for INR check. Memory loss related to Dementia is at baseline. FAST 6a. She is compliant with her medication; in no acute distress. Encouraged ongoing care. Follow up: Palliative care will continue to follow patient for goals of care clarification and symptom management. I spent 46 minutes providing this consultation; time includes chart review and documentation. More than 50% of the time in this consultation was spent on coordinating communication.  HISTORY OF PRESENT ILLNESS:Angela  Knightis a 74 y.o.year oldfemalewith multiple medical problems including dementia, afib on coumadin, cardiomyopathy with EF 15-20%, CHF, arthritis. Palliative Care was asked to help address goals of care.   CODE STATUS: Full  PPS: 40% HOSPICE ELIGIBILITY/DIAGNOSIS: TBD  PAST MEDICAL HISTORY:  Past Medical History:  Diagnosis Date  . Abnormality of gait 08/23/2014  . Arthritis   . Atrial fibrillation ([place])    A.  Chronic Coumadin  . Cardiac arrest - ventricular fibrillation    A.  12/2007;  B. 01/2008 St. Jude Promote Bi-V ICD placed  . CVA (cerebral vascular accident) ([place])   . Diverticular disease   . DVT (deep venous thrombosis) ([place])   . 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 History of colon cancer    A.  1999 - T3, N1  chemotherapy  . Internal hemorrhoids   . Memory deficit 08/23/2014  . Nonischemic cardiomyopathy ([place])    a.  11/2001 - Cath - NL Cors; b. ECHO 09/22/11: EF 20%, MVR normal, moderate LAE, mild RAE c. ECHO (08/2012): ED 20%, diff HK, LA mod dilated, mild/mod TR, RV mild/mod decreased sys fx  . Rectal bleeding 04/28/2017  . Rheumatic heart disease    A. 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 Sick sinus syndrome ([place])    A.  s/p pacer in 1995.  B.    . Systolic CHF, chronic ([place])    A.  01/2008 Echo - EF 10-20%  . UTI (urinary tract infection) 01/19/2019    SOCIAL HX:  Social History   Tobacco Use  . Smoking status: Never Smoker  . Smokeless tobacco: Never Used  Substance Use Topics  . Alcohol use: No    ALLERGIES: No Known Allergies   PERTINENT MEDICATIONS:  Outpatient Encounter Medications as of 11/04/2019  Medication Sig  . alendronate (FOSAMAX) 70 MG tablet Take 70 mg by mouth once a week.  . ALPRAZolam (XANAX) 0.25 MG tablet Take 0.25 mg by mouth at bedtime as needed.  [name] amiodarone (PACERONE) 200 MG tablet Take 1 tablet (200 mg total) by mouth daily.  . carvedilol (COREG) 3.125 MG tablet  Take 1 tablet (3.125 mg total) by mouth 2 (two) times daily with a meal.  . digoxin (LANOXIN) 0.125 MG tablet Take 125 mcg by mouth daily.  . furosemide (LASIX) 20 MG tablet Take 20 mg by mouth daily.  . midodrine (PROAMATINE) 10 MG tablet Take 1 tablet (10 mg total) by mouth 3 (three) times daily.  . Multiple Vitamins-Minerals (AIRBORNE GUMMIES PO) Take 1 tablet by mouth daily.  [name] oxyCODONE (OXY IR/ROXICODONE) 5 MG immediate release tablet Take 1 tablet (5 mg total) by mouth every 6 (six) hours as needed for moderate pain.  [name] tiZANidine (ZANAFLEX) 4 MG tablet Take 4 mg by mouth daily as needed for muscle spasms.   [name] warfarin (COUMADIN) 1 MG tablet Take 2 tablets (2 mg total) by mouth See admin instructions. Take 2 tablets (2mg ) by mouth on on Tuesdays and Thursdays. Take 1 tablet (5mg ) by mouth on Sunday, Monday, Wednesday, Friday and Saturday (Patient taking differently: Take 2 mg by mouth See admin instructions. Take 2 tmg) by mouth on Tuesday. Take 1 tablet (5mg ) by mouth on Sunday, Monday, Wednesday,Friday, and Saturday.)  . warfarin (COUMADIN) 5 MG tablet Take 1 tablet (5 mg total) by mouth See admin instructions. Take 1 tablet (5mg ) by mouth on Sunday, Monday, Wednesday, Friday and Saturday. Take 2mg  on Tuesdays and Thursdays   No facility-administered encounter medications on file as of 11/04/2019.    PHYSICAL EXAM:   General: NAD, frail appearing, thin Cardiovascular: regular rate and rhythm Pulmonary: clear ant fields Abdomen: soft, nontender, + bowel sounds GU: no suprapubic tenderness Extremities: no edema, no joint deformities Skin: no rashes Neurological: Weakness but otherwise nonfocal  Angela Spray, NP

## 2020-01-31 ENCOUNTER — Telehealth: Payer: Self-pay | Admitting: Internal Medicine

## 2020-01-31 NOTE — Telephone Encounter (Signed)
Returned call to NP.  Advised Pt has mechanical valve.  No further questions.

## 2020-01-31 NOTE — Telephone Encounter (Signed)
New Message    Pt c/o medication issue:  1. Name of Medication: warfarin (COUMADIN) 5 MG tablet  2. How are you currently taking this medication (dosage and times per day)?  6 mg   3. Are you having a reaction (difficulty breathing--STAT)? No   4. What is your medication issue? Santiago Glad is a NP and has questions about why the pt is on this medication and not on something different     Please call

## 2020-02-01 ENCOUNTER — Ambulatory Visit (INDEPENDENT_AMBULATORY_CARE_PROVIDER_SITE_OTHER): Payer: Medicare Other | Admitting: *Deleted

## 2020-02-01 DIAGNOSIS — I4901 Ventricular fibrillation: Secondary | ICD-10-CM | POA: Diagnosis not present

## 2020-02-01 LAB — CUP PACEART REMOTE DEVICE CHECK
Battery Remaining Longevity: 20 mo
Battery Remaining Percentage: 24 %
Battery Voltage: 2.92 V
Date Time Interrogation Session: 20210608020017
HighPow Impedance: 45 Ohm
HighPow Impedance: 46 Ohm
Implantable Lead Implant Date: 20090618
Implantable Lead Implant Date: 20090618
Implantable Lead Location: 753858
Implantable Lead Location: 753860
Implantable Lead Model: 7120
Implantable Pulse Generator Implant Date: 20160111
Lead Channel Impedance Value: 350 Ohm
Lead Channel Impedance Value: 440 Ohm
Lead Channel Pacing Threshold Amplitude: 0.375 V
Lead Channel Pacing Threshold Amplitude: 0.5 V
Lead Channel Pacing Threshold Pulse Width: 0.5 ms
Lead Channel Pacing Threshold Pulse Width: 0.5 ms
Lead Channel Sensing Intrinsic Amplitude: 12 mV
Lead Channel Setting Pacing Amplitude: 2 V
Lead Channel Setting Pacing Amplitude: 2 V
Lead Channel Setting Pacing Pulse Width: 0.5 ms
Lead Channel Setting Pacing Pulse Width: 0.5 ms
Lead Channel Setting Sensing Sensitivity: 0.5 mV
Pulse Gen Serial Number: 7219979

## 2020-02-02 NOTE — Progress Notes (Signed)
Remote ICD transmission.   

## 2020-03-21 IMAGING — CR PORTABLE ABDOMEN - 1 VIEW
1 series · 1 of 1 positions shown · non-contrast
Comparison: 11/30/2018

CLINICAL DATA: Vomiting and fever

EXAM:
PORTABLE ABDOMEN - 1 VIEW

[AP]
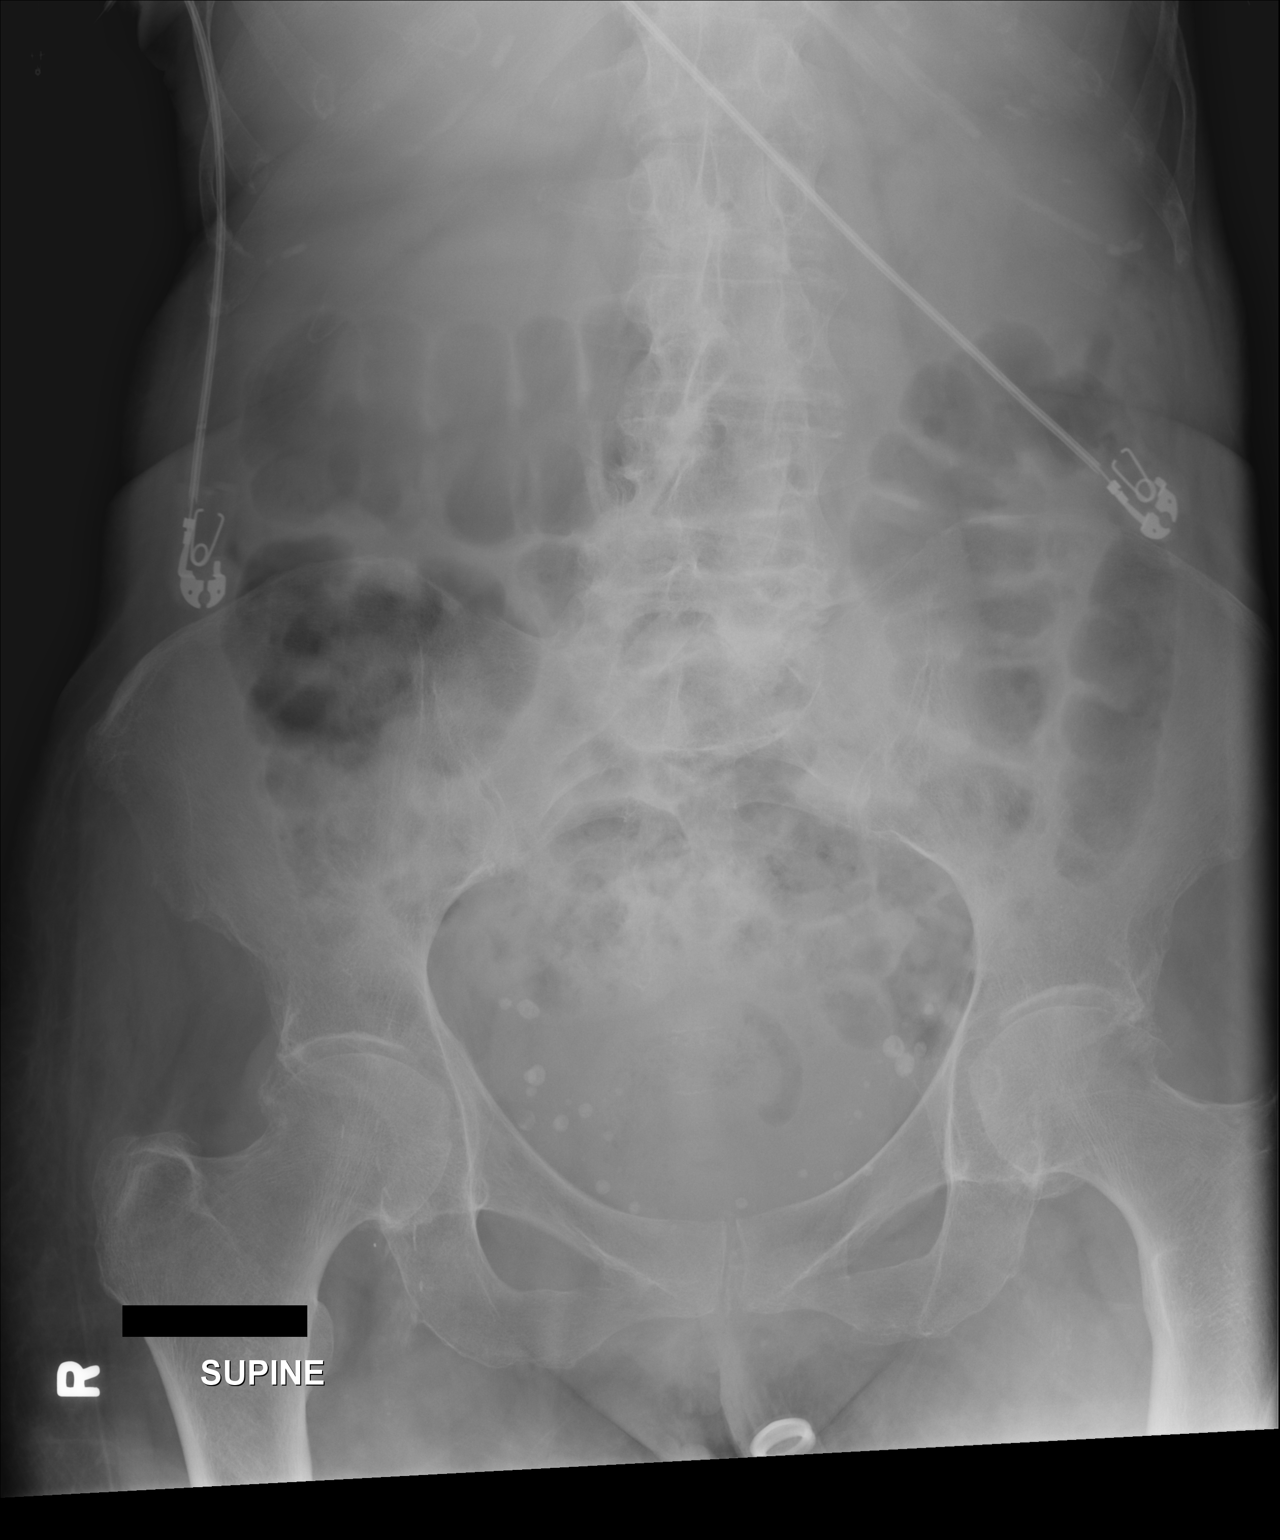

[1 of 1 positions shown; findings below may reference images not displayed]

FINDINGS: The bowel gas pattern is normal. No radio-opaque calculi or other
significant radiographic abnormality are seen. There is a
mild-to-moderate amount of stool in the colon. Multiple phleboliths
project over the patient's pelvis.
IMPRESSION: Nonobstructive bowel gas pattern.

## 2020-03-29 IMAGING — DX PORTABLE CHEST - 1 VIEW
1 series · 1 of 1 positions shown · non-contrast
Comparison: Eight days ago

CLINICAL DATA: Shortness of breath

EXAM:
PORTABLE CHEST 1 VIEW

[chest ap]
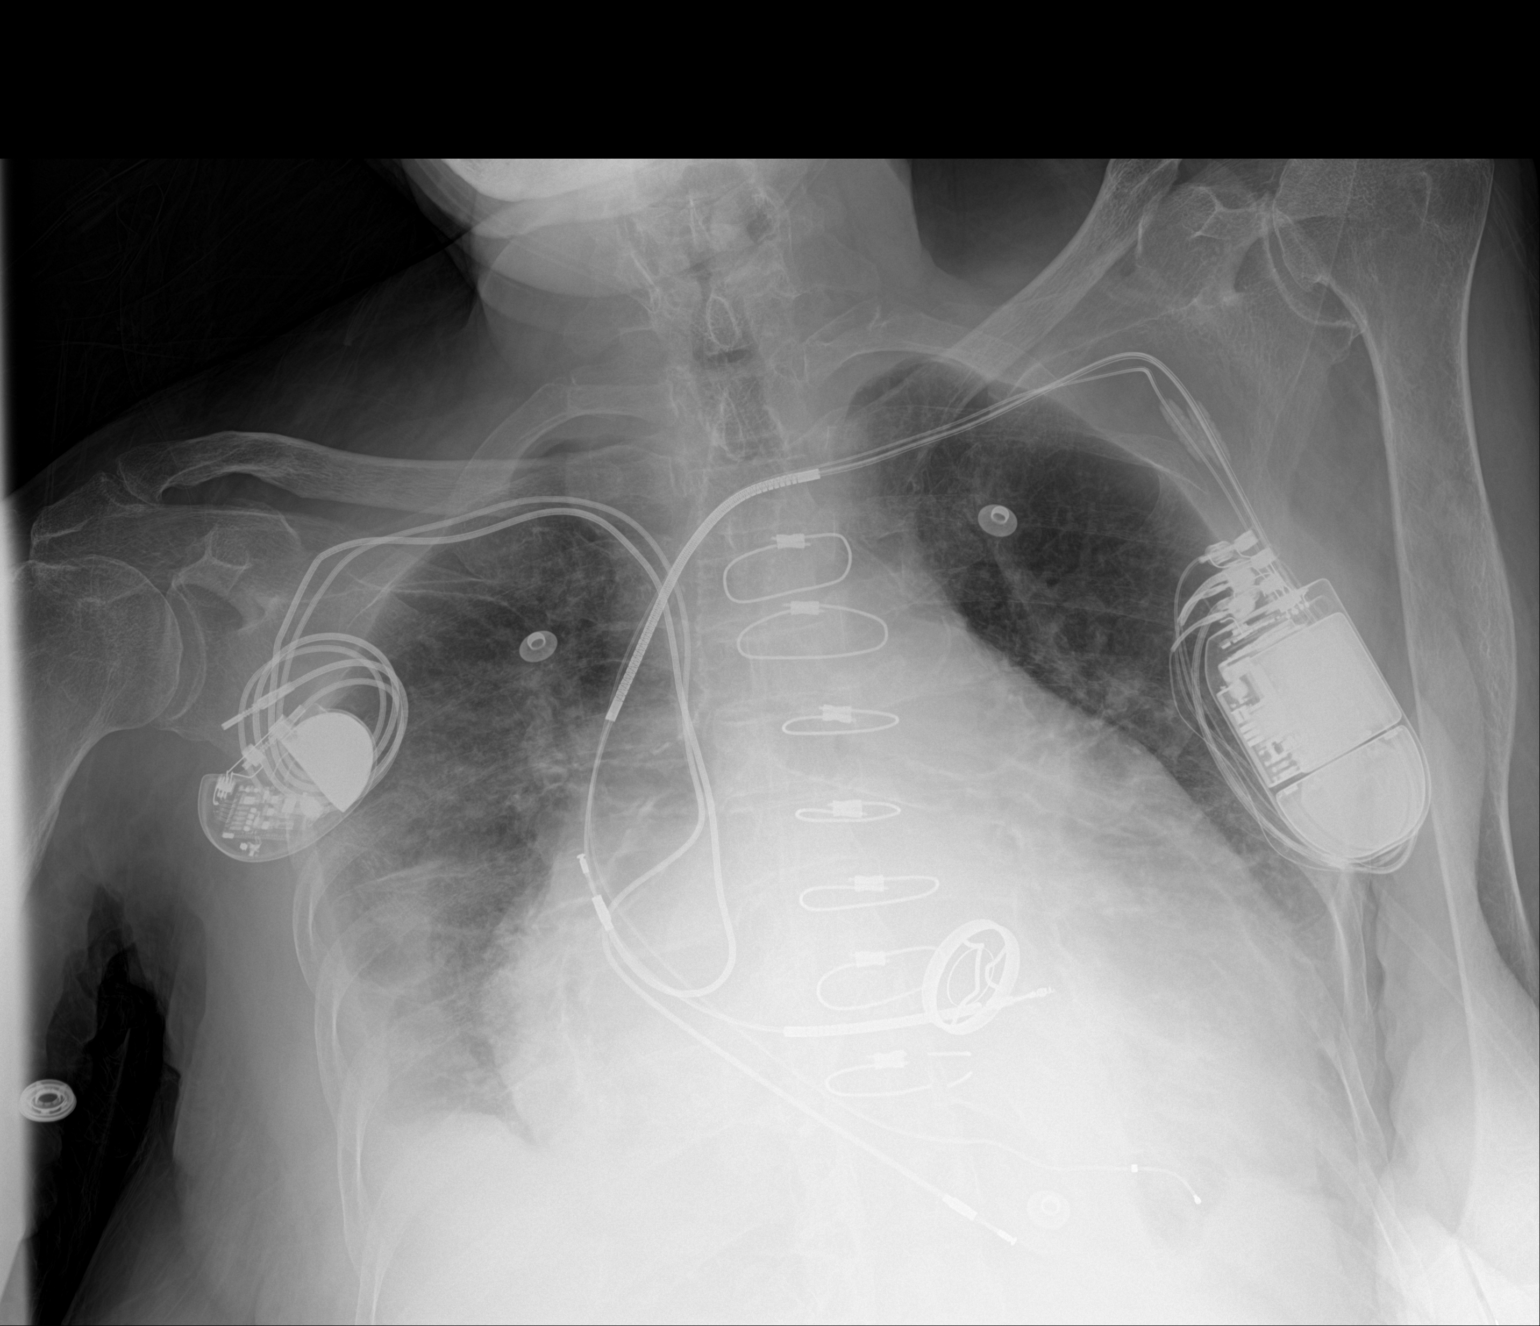

[1 of 1 positions shown; findings below may reference images not displayed]

FINDINGS: Cardiomegaly. There is new interstitial opacity with small pleural
effusions. Biventricular pacer with leads from both left and right
in stable position. Mitral valve replacement.
IMPRESSION: CHF pattern.

## 2020-05-02 ENCOUNTER — Ambulatory Visit (INDEPENDENT_AMBULATORY_CARE_PROVIDER_SITE_OTHER): Payer: Medicare Other | Admitting: *Deleted

## 2020-05-02 DIAGNOSIS — I472 Ventricular tachycardia, unspecified: Secondary | ICD-10-CM

## 2020-05-02 LAB — CUP PACEART REMOTE DEVICE CHECK
Battery Remaining Longevity: 20 mo
Battery Remaining Percentage: 24 %
Battery Voltage: 2.9 V
Date Time Interrogation Session: 20210907024434
HighPow Impedance: 42 Ohm
HighPow Impedance: 42 Ohm
Implantable Lead Implant Date: 20090618
Implantable Lead Implant Date: 20090618
Implantable Lead Location: 753858
Implantable Lead Location: 753860
Implantable Lead Model: 7120
Implantable Pulse Generator Implant Date: 20160111
Lead Channel Impedance Value: 300 Ohm
Lead Channel Impedance Value: 430 Ohm
Lead Channel Pacing Threshold Amplitude: 0.5 V
Lead Channel Pacing Threshold Amplitude: 0.5 V
Lead Channel Pacing Threshold Pulse Width: 0.5 ms
Lead Channel Pacing Threshold Pulse Width: 0.5 ms
Lead Channel Sensing Intrinsic Amplitude: 12 mV
Lead Channel Setting Pacing Amplitude: 2 V
Lead Channel Setting Pacing Amplitude: 2 V
Lead Channel Setting Pacing Pulse Width: 0.5 ms
Lead Channel Setting Pacing Pulse Width: 0.5 ms
Lead Channel Setting Sensing Sensitivity: 0.5 mV
Pulse Gen Serial Number: 7219979

## 2020-05-03 NOTE — Progress Notes (Signed)
Remote ICD transmission.   

## 2020-05-23 ENCOUNTER — Telehealth: Payer: Self-pay | Admitting: Hospice

## 2020-05-23 DIAGNOSIS — Z515 Encounter for palliative care: Secondary | ICD-10-CM

## 2020-05-23 NOTE — Telephone Encounter (Signed)
Several calls to patient, not picked; voicemail left with callback number.  Also called [name] several times; voicemail not set up to receive messages.

## 2020-08-01 ENCOUNTER — Ambulatory Visit (INDEPENDENT_AMBULATORY_CARE_PROVIDER_SITE_OTHER): Payer: Medicare Other

## 2020-08-01 DIAGNOSIS — I472 Ventricular tachycardia, unspecified: Secondary | ICD-10-CM

## 2020-08-01 LAB — CUP PACEART REMOTE DEVICE CHECK
Battery Remaining Longevity: 22 mo
Battery Remaining Percentage: 26 %
Battery Voltage: 2.89 V
Date Time Interrogation Session: 20211207020015
HighPow Impedance: 44 Ohm
HighPow Impedance: 45 Ohm
Implantable Lead Implant Date: 20090618
Implantable Lead Implant Date: 20090618
Implantable Lead Location: 753858
Implantable Lead Location: 753860
Implantable Lead Model: 7120
Implantable Pulse Generator Implant Date: 20160111
Lead Channel Impedance Value: 330 Ohm
Lead Channel Impedance Value: 460 Ohm
Lead Channel Pacing Threshold Amplitude: 0.5 V
Lead Channel Pacing Threshold Amplitude: 0.5 V
Lead Channel Pacing Threshold Pulse Width: 0.5 ms
Lead Channel Pacing Threshold Pulse Width: 0.5 ms
Lead Channel Sensing Intrinsic Amplitude: 12 mV
Lead Channel Setting Pacing Amplitude: 2 V
Lead Channel Setting Pacing Amplitude: 2 V
Lead Channel Setting Pacing Pulse Width: 0.5 ms
Lead Channel Setting Pacing Pulse Width: 0.5 ms
Lead Channel Setting Sensing Sensitivity: 0.5 mV
Pulse Gen Serial Number: 7219979

## 2020-08-11 NOTE — Progress Notes (Signed)
Remote ICD transmission.   

## 2020-08-17 ENCOUNTER — Telehealth: Payer: Self-pay

## 2020-08-17 NOTE — Telephone Encounter (Signed)
Attempted to return son call, no answer, mailbox is full not accepting messages.

## 2020-08-17 NOTE — Telephone Encounter (Signed)
Spoke to pt son again after he spoke with pt.  He reports pt did feel the shock, after the shock occurred she was clammy and drenched with sweat.  Pt reportedly feels good today, denies any cardiac symptoms.  Per pt son, she is taking her medications as ordered and he will reiterate the importance of Amiodarone and Carvedilol with pt in case she missed any doses.    Reviewed shock plan with pt son and advised of ED precautions.  Also informed of DMV driving restrictions for 6 months.  He stated that his mother does not drive.    Advised pt son, it would be a good idea for pt to have f./u appt with MD or APP in coming weeks.  Will send a message to scheduling to contact him next week.

## 2020-08-17 NOTE — Telephone Encounter (Signed)
Pt son [name] returning nurse call. I told him the nurse will call him back.

## 2020-08-17 NOTE — Telephone Encounter (Signed)
There was one fast ventricular arrhythmia detected that fell into the VF zone and failed antitachycardia pacing but was successfully converted with an ICD shock.   Attempted to reach pt on home #, no answer/ no VM.  Spoke with pt son, [name] (DPR on file).  Advised that pt received shock last night.  He reports he spoke with his mother and she did report being ill last night.  Requested he have patient call so we can discuss how she was feeling and provide follow-up info.    Patient meds include- Amiodarone 200g daily, Carvedilol 3.125 mg BID, Digoxin 0.125mg  daily

## 2020-08-20 NOTE — Telephone Encounter (Signed)
Angela [name] has had many shocks and does not drive. No change in meds.

## 2020-08-21 NOTE — Telephone Encounter (Signed)
[place] alert received 12/27. Normal device function. No alerts noted since previous on 07/17/20 which was reviewed by Dr. Ladona Ridgel and no changes in medication at this time. Trey Paula (DPR), states patient reported of chest pain this past Friday into Saturday. Denies pain at this time. Patient has follow up 08/24/20 with APP, stressed importance to make attendance to apt. Trey Paula states patient has apt. With PCP tomorrow 08/22/20. Advised to call with questions or concerns. Verbalized understanding.  Presenting: Biv Paced 73 bpm. Normal device function.  No new alerts. Shock plan/ED precaution education provided. Verbal understanding.  Routing to Dr. Ladona Ridgel to make aware of cardiac symptoms over the weekend. Patient received ICD shock 08/16/20, has been addressed and no changes at that time.

## 2020-08-21 NOTE — Telephone Encounter (Signed)
The patient son [name] states the patient was complaining of chest pain Friday night into Saturday. He states she is not currently having chest pains. He wanted to know if anything show up on the transmission. I had him send a manual transmission with the home monitor. He states the nurse wanted the patient to come in office check. I got [place] to schedule the patient for 08/24/2020 at 9:45 am. Transmission received. I told him the nurse will give him a call back.

## 2020-08-21 NOTE — Telephone Encounter (Signed)
No new treatment.  

## 2020-08-24 ENCOUNTER — Encounter: Payer: Medicare Other | Admitting: Physician Assistant

## 2020-08-24 NOTE — Progress Notes (Deleted)
Cardiology Office Note Date:  08/24/2020  Patient ID:  Angela [name], Angela [name] 02-Jan-1946, MRN [id number] PCP:  Lorene Dy, MD  Electrophysiologist:  Dr. Lovena Le   Chief Complaint:  ***  History of Present Illness: Angela [name] is a 74 y.o. 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  She comes today to be seen for Dr. Lovena Le,  Last seen by him in July 2020, more recently saw A. Tillery, PA  After her last hospitalization with LE embolic event.  She was doing well, no arrhythmia, no changes were made  On midodrine  Device alert 08/17/20 for VT > ATP > shock on 08/16/20 she was aware of the event, symptomatic, Dr. Lovena Le was made aware and recommended no new tx or changes in medicines. Subsequent notes report some CP post ? For a couple days and resolved, new transmission noted no new events > Dr. Lovena Le again recommended no changes.  *** symptoms, ?CP *** syncope? *** labs, lytes,  *** who manages warfarin? *** dig level *** more coreg???  BP?  Device information: SJM CRT-D, implanted 2009, gen change 09/05/14 She has an abandoned pacing system + appropriate tx AAD Amiodarone resumed May 2020   Past Medical History:  Diagnosis Date  . Abnormality of gait 08/23/2014  . Arthritis   . Atrial fibrillation ([place])    A.  Chronic Coumadin  . Cardiac arrest - ventricular fibrillation    A.  12/2007;  B. 01/2008 St. Jude Promote Bi-V ICD placed  . CVA (cerebral vascular accident) ([place])   . Diverticular disease   . DVT (deep venous thrombosis) ([place])   . 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 History of colon cancer    A.  1999 - T3, N1  chemotherapy  . Internal  hemorrhoids   . Memory deficit 08/23/2014  . Nonischemic cardiomyopathy ([place])    a.  11/2001 - Cath - NL Cors; b. ECHO 09/22/11: EF 20%, MVR normal, moderate LAE, mild RAE c. ECHO (08/2012): ED 20%, diff HK, LA mod dilated, mild/mod TR, RV mild/mod decreased sys fx  . Rectal bleeding 04/28/2017  . Rheumatic heart disease    A. 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 Sick sinus syndrome ([place])    A.  s/p pacer in 1995.  B.    . Systolic CHF, chronic ([place])    A.  01/2008 Echo - EF 10-20%  . UTI (urinary tract infection) 01/19/2019    Past Surgical History:  Procedure Laterality Date  . ABDOMINAL HYSTERECTOMY    . AV NODE ABLATION N/A 09/23/2011   Procedure: AV NODE ABLATION;  Surgeon: Evans Lance, MD;  Location[facility];  Service: Cardiovascular;  Laterality: N/A;  . BIV ICD GENERTAOR CHANGE OUT N/A 09/05/2014   Procedure: BIV ICD GENERTAOR CHANGE OUT;  Surgeon: Evans Lance, MD;  Location: [facility] CATH LAB;  Service: Cardiovascular;  Laterality: N/A;  . Biventricular AICD    . COLON SURGERY  1999  . EMBOLECTOMY Left 10/01/2019   Procedure: Left Popliteal Artery Thrombectomy with Vein Patch Angioplasty; Left Tibial Artery Thrombectomy;  Surgeon: Elam Dutch, MD;  Location: [facility] OR;  Service: Vascular;  Laterality: Left;  . History of echocardiogram  2003, 2007, 2009  . I & D EXTREMITY Left 10/03/2019   Procedure: EVACUATION OF HEMATOMA  LEFT LEG;  Surgeon: Elam Dutch, MD;  Location: [facility];  Service: Vascular;  Laterality: Left;  . INCISION AND DRAINAGE PERIRECTAL ABSCESS Right 11/28/2018   Procedure: IRRIGATION AND DEBRIDEMENT PERIRECTAL ABSCESS;  Surgeon: Greer Pickerel, MD;  Location: [facility];  Service: General;  Laterality: Right;  . Mitral Valve Replacement, Bjork-Shiley valve  1983    Current Outpatient Medications  Medication Sig Dispense Refill  . alendronate (FOSAMAX) 70 MG tablet Take 70 mg by mouth once a week.    . ALPRAZolam (XANAX) 0.25 MG tablet Take 0.25 mg by mouth at bedtime as  needed.    [name] amiodarone (PACERONE) 200 MG tablet Take 1 tablet (200 mg total) by mouth daily. 30 tablet 3  . carvedilol (COREG) 3.125 MG tablet Take 1 tablet (3.125 mg total) by mouth 2 (two) times daily with a meal. 180 tablet 3  . digoxin (LANOXIN) 0.125 MG tablet Take 125 mcg by mouth daily.    . furosemide (LASIX) 20 MG tablet Take 20 mg by mouth daily.    . midodrine (PROAMATINE) 10 MG tablet Take 1 tablet (10 mg total) by mouth 3 (three) times daily. 90 tablet 1  . Multiple Vitamins-Minerals (AIRBORNE GUMMIES PO) Take 1 tablet by mouth daily.    [name] oxyCODONE (OXY IR/ROXICODONE) 5 MG immediate release tablet Take 1 tablet (5 mg total) by mouth every 6 (six) hours as needed for moderate pain. 20 tablet 0  . tiZANidine (ZANAFLEX) 4 MG tablet Take 4 mg by mouth daily as needed for muscle spasms.     [name] warfarin (COUMADIN) 1 MG tablet Take 2 tablets (2 mg total) by mouth See admin instructions. Take 2 tablets (2mg ) by mouth on on Tuesdays and Thursdays. Take 1 tablet (5mg ) by mouth on Sunday, Monday, Wednesday, Friday and Saturday (Patient taking differently: Take 2 mg by mouth See admin instructions. Take 2 tmg) by mouth on Tuesday. Take 1 tablet (5mg ) by mouth on Sunday, Monday, Wednesday,Friday, and Saturday.)    . warfarin (COUMADIN) 5 MG tablet Take 1 tablet (5 mg total) by mouth See admin instructions. Take 1 tablet (5mg ) by mouth on Sunday, Monday, Wednesday, Friday and Saturday. Take 2mg  on Tuesdays and Thursdays 30 tablet 2   No current facility-administered medications for this visit.    Allergies:   Patient has no known allergies.   Social History:  The patient  reports that she has never smoked. She has never used smokeless tobacco. She reports that she does not drink alcohol and does not use drugs.   Family History:  The patient's family history includes Coronary artery disease in her mother and sister; Diabetes (age of onset: 26) in her mother; Lung cancer (age of onset: 76) in her  father.  ROS:  Please see the history of present illness.  All other systems are reviewed and otherwise negative.   PHYSICAL EXAM:  VS:  There were no vitals taken for this visit. BMI: There is no height or weight on file to calculate BMI. Well nourished, well developed, in no acute distress  HEENT: normocephalic, atraumatic  Neck: no JVD, carotid bruits or masses Cardiac:  *** RRR (paced) w/extrasystoles appreciated; valve noted, no rubs, or gallops Lungs:  *** CTA, no wheezing, rhonchi or rales  Abd: soft, nontender MS: no deformity, + age appropriate atrophy Ext: ***  edema  Skin: warm and dry, no rash Neuro:  No gross deficits appreciated Psych: euthymic mood, full affect  *** ICD site is stable, no tethering or discomfort   EKG:   Not done today   ICD interrogation done today and reviewed by myself:   ***  10/27/2018: TTE IMPRESSIONS  1. The left ventricle has a visually estimated ejection fraction of of  15-20%. The cavity size was severely dilated. Left ventricular diastolic  Doppler parameters are indeterminate due to mitral valve  replacement/repair. Left ventricular diffuse  hypokinesis.  2. The right ventricle has moderately reduced systolic function. The  cavity was normal. There is no increase in right ventricular wall  thickness.  3. Left atrial size was severely dilated.  4. Right atrial size was severely dilated.  5. A mechanical valve is present in the mitral position. Procedure Date:  1983.  6. Bjork-Shiley mechanical mitral valve prosthesis functioning properly.  7. The tricuspid valve is normal in structure.  8. The aortic valve is normal in structure.  9. The pulmonic valve was normal in structure. Pulmonic valve  regurgitation is mild by color flow Doppler.  10. The inferior vena cava was normal in size with <50% respiratory  variability.     09/14/12 TTE Study Conclusions - Left ventricle: The cavity size was moderately  dilated. Wall thickness was normal. Indeterminant diastolic function. The estimated ejection fraction was 20%. Diffuse hypokinesis. - Aortic valve: There was no stenosis. - Mitral valve: Mechanical mitral valve. No evidence for significant prosthetic stenosis. Trivial regurgitation. Mean gradient: 73mm Hg (D). - Left atrium: The atrium was moderately dilated. - Right ventricle: The cavity size was normal. Pacer wire or catheter noted in right ventricle. Systolic function was mildly to moderately reduced. - Right atrium: The atrium was mildly dilated. - Atrial septum: The septum bowed from left to right, consistent with increased left atrial pressure. - Tricuspid valve: Mild-moderate regurgitation. Peak RV-RA gradient:52mm Hg (S). - Pulmonary arteries: PA peak pressure: 37mm Hg (S). - Inferior vena cava: The vessel was normal in size; the respirophasic diameter changes were in the normal range (= 50%); findings are consistent with normal central venous pressure. Impressions - Moderately dilated LV with severe global hypokinesis, EF 20%. Mechanical mitral valve appears to function normally. Normal RV size with mild to moderately decreased systolic function. Mild pulmonary hypertension.  Recent Labs: 09/29/2019: B Natriuretic Peptide 611.2 10/12/2019: BUN 15; Creatinine, Ser 0.98; Magnesium 1.9; Potassium 3.5; Sodium 140 10/13/2019: Hemoglobin 9.2; Platelets 222  No results found for requested labs within last 8760 hours.   CrCl cannot be calculated (Patient's most recent lab result is older than the maximum 21 days allowed.).   Wt Readings from Last 3 Encounters:  10/29/19 134 lb (60.8 kg)  10/28/19 116 lb (52.6 kg)  10/13/19 116 lb 6.5 oz (52.8 kg)     Other studies reviewed: Additional studies/records reviewed today include: summarized above  ASSESSMENT AND PLAN:  1. ICD     ***  2. VT     NSVT and PVCs known for her     ***  3. Permanant  AFib     CHA2DS2Vasc is at least *** 5, on *** warfarin   4. Mechanical MVR     Last echo *** 2020 with proper function     On warfarin     She has had a number of embolic events, as well as a bleeding event unfortunately      ***  5. NICM 6. Chronic CHF (sytolic)     *** % BV  pacing     *** On  BP is limiting, on midodrine  7. HTN     *** Relative hypotension of late  Disposition: ***    Current medicines are reviewed at length with the patient today.  The patient did not have any concerns regarding medicines.  Norma Fredrickson, PA-C 08/24/2020 4:03 AM     [facility] [address] [place] [address] [phone] (office)  [phone] (fax)

## 2020-09-05 NOTE — Progress Notes (Signed)
Cardiology Office Note Date:  09/05/2020  Patient ID:  Angela, [name] [birth year], MRN [id number] PCP:  Lorene Dy, MD  Electrophysiologist:  Dr. Lovena Le   Chief Complaint:  VT  History of Present Illness: Angela [name] is a 75 y.o. 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  She comes today to be seen for Dr. Lovena Le,  Last seen by him in July 2020, more recently saw A. Tillery, PA  After her last hospitalization with LE embolic event.  She was doing well, no arrhythmia, no changes were made  On midodrine  Device alert 08/17/20 for VT > ATP > shock on 08/16/20 she was aware of the event, symptomatic, Dr. Lovena Le was made aware and recommended no new tx or changes in medicines. Subsequent notes report some CP post ? For a couple days and resolved, new transmission noted no new events > Dr. Lovena Le again recommended no changes.  TODAY She comes today accompanied by her son, who she lives with. The day of her shock, she was in the recliner, he had fallen asleep on the sofa and woke to her calling for him.Said she had been shocked, and felt poorly, he note that she was drenched in sweat. She mentions some degree of CP, aching that is random, not exertional necessarily. No associated SOB. She ambulates at home with a cane, uses a wheelchair when at stores, appointments a little too unsteady on her feet, and tends to get weak in the legs. She does not think that she fainted, recalls the shock, has had none since.  Warfarin is managed by her PMD, denies any bleeding or signs of bleeding    Device information: SJM CRT-D, implanted 2009, gen change 09/05/14 She has an abandoned pacing system + appropriate tx AAD Amiodarone resumed May  2020   Past Medical History:  Diagnosis Date  . Abnormality of gait 08/23/2014  . Arthritis   . Atrial fibrillation ([place])    A.  Chronic Coumadin  . Cardiac arrest - ventricular fibrillation    A.  12/2007;  B. 01/2008 St. Jude Promote Bi-V ICD placed  . CVA (cerebral vascular accident) ([place])   . Diverticular disease   . DVT (deep venous thrombosis) ([place])   . 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 History of colon cancer    A.  1999 - T3, N1  chemotherapy  . Internal hemorrhoids   . Memory deficit 08/23/2014  . Nonischemic cardiomyopathy ([place])    a.  11/2001 - Cath - NL Cors; b. ECHO 09/22/11: EF 20%, MVR normal, moderate LAE, mild RAE c. ECHO (08/2012): ED 20%, diff HK, LA mod dilated, mild/mod TR, RV mild/mod decreased sys fx  . Rectal bleeding 04/28/2017  . Rheumatic heart disease    A. 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 Sick sinus syndrome ([place])    A.  s/p pacer in 1995.  B.    . Systolic CHF, chronic ([place])    A.  01/2008 Echo - EF 10-20%  . UTI (urinary tract infection) 01/19/2019    Past Surgical History:  Procedure Laterality Date  . ABDOMINAL HYSTERECTOMY    . AV NODE ABLATION N/A 09/23/2011   Procedure: AV NODE ABLATION;  Surgeon: Evans Lance, MD;  Location: [facility] CATH LAB;  Service: Cardiovascular;  Laterality: N/A;  . BIV ICD GENERTAOR CHANGE OUT N/A 09/05/2014   Procedure: BIV ICD GENERTAOR CHANGE OUT;  Surgeon: Evans Lance, MD;  Location: [facility] CATH LAB;  Service: Cardiovascular;  Laterality: N/A;  . Biventricular AICD    . COLON SURGERY  1999  . EMBOLECTOMY Left 10/01/2019   Procedure: Left Popliteal Artery Thrombectomy with Vein Patch Angioplasty; Left Tibial Artery Thrombectomy;  Surgeon: Elam Dutch, MD;  Location: [facility] OR;  Service: Vascular;  Laterality: Left;  . History of echocardiogram  2003, 2007, 2009  . I & D EXTREMITY Left 10/03/2019   Procedure: EVACUATION OF HEMATOMA  LEFT LEG;  Surgeon:  Elam Dutch, MD;  Location: [facility];  Service: Vascular;  Laterality: Left;  . INCISION AND DRAINAGE PERIRECTAL ABSCESS Right 11/28/2018   Procedure: IRRIGATION AND DEBRIDEMENT PERIRECTAL ABSCESS;  Surgeon: Greer Pickerel, MD;  Location: [facility];  Service: General;  Laterality: Right;  . Mitral Valve Replacement, Bjork-Shiley valve  1983    Current Outpatient Medications  Medication Sig Dispense Refill  . alendronate (FOSAMAX) 70 MG tablet Take 70 mg by mouth once a week.    . ALPRAZolam (XANAX) 0.25 MG tablet Take 0.25 mg by mouth at bedtime as needed.    [name] amiodarone (PACERONE) 200 MG tablet Take 1 tablet (200 mg total) by mouth daily. 30 tablet 3  . carvedilol (COREG) 3.125 MG tablet Take 1 tablet (3.125 mg total) by mouth 2 (two) times daily with a meal. 180 tablet 3  . digoxin (LANOXIN) 0.125 MG tablet Take 125 mcg by mouth daily.    . furosemide (LASIX) 20 MG tablet Take 20 mg by mouth daily.    . midodrine (PROAMATINE) 10 MG tablet Take 1 tablet (10 mg total) by mouth 3 (three) times daily. 90 tablet 1  . Multiple Vitamins-Minerals (AIRBORNE GUMMIES PO) Take 1 tablet by mouth daily.    [name] oxyCODONE (OXY IR/ROXICODONE) 5 MG immediate release tablet Take 1 tablet (5 mg total) by mouth every 6 (six) hours as needed for moderate pain. 20 tablet 0  . tiZANidine (ZANAFLEX) 4 MG tablet Take 4 mg by mouth daily as needed for muscle spasms.     [name] warfarin (COUMADIN) 1 MG tablet Take 2 tablets (2 mg total) by mouth See admin instructions. Take 2 tablets (2mg ) by mouth on on Tuesdays and Thursdays. Take 1 tablet (5mg ) by mouth on Sunday, Monday, Wednesday, Friday and Saturday (Patient taking differently: Take 2 mg by mouth See admin instructions. Take 2 tmg) by mouth on Tuesday. Take 1 tablet (5mg ) by mouth on Sunday, Monday, Wednesday,Friday, and Saturday.)    . warfarin (COUMADIN) 5 MG tablet Take 1 tablet (5 mg total) by mouth See admin instructions. Take 1 tablet (5mg ) by mouth on Sunday, Monday,  Wednesday, Friday and Saturday. Take 2mg  on Tuesdays and Thursdays 30 tablet 2   No current facility-administered medications for this visit.    Allergies:   Patient has no known allergies.   Social History:  The patient  reports that she has never smoked. She has never used smokeless tobacco. She reports that she does not drink alcohol and does not use drugs.   Family History:  The patient's family history includes Coronary artery disease in her mother and sister; Diabetes (age of onset: 64) in her mother; Lung cancer (age of onset: 83) in her father.  ROS:  Please see the history  of present illness.  All other systems are reviewed and otherwise negative.   PHYSICAL EXAM:  VS:  There were no vitals taken for this visit. BMI: There is no height or weight on file to calculate BMI. Well nourished, well developed though thin, in no acute distress  HEENT: normocephalic, atraumatic  Neck: no JVD, carotid bruits or masses Cardiac:  RRR (paced) w/extrasystoles appreciated; valve noted, no rubs, or gallops Lungs:  CTA, no wheezing, rhonchi or rales  Abd: soft, nontender MS: no deformity, + age appropriate atrophy Ext: trace edema  Skin: warm and dry, no rash Neuro:  No gross deficits appreciated Psych: euthymic mood, full affect  ICD site is stable, no tethering or discomfort, no thinning   EKG:   Done today and reviewed by myself V paced, unchanged   ICD interrogation done today and reviewed by myself:   Battery and lead measurements are good No further VT BP 97%  10/27/2018: TTE IMPRESSIONS  1. The left ventricle has a visually estimated ejection fraction of of  15-20%. The cavity size was severely dilated. Left ventricular diastolic  Doppler parameters are indeterminate due to mitral valve  replacement/repair. Left ventricular diffuse  hypokinesis.  2. The right ventricle has moderately reduced systolic function. The  cavity was normal. There is no increase in right  ventricular wall  thickness.  3. Left atrial size was severely dilated.  4. Right atrial size was severely dilated.  5. A mechanical valve is present in the mitral position. Procedure Date:  1983.  6. Bjork-Shiley mechanical mitral valve prosthesis functioning properly.  7. The tricuspid valve is normal in structure.  8. The aortic valve is normal in structure.  9. The pulmonic valve was normal in structure. Pulmonic valve  regurgitation is mild by color flow Doppler.  10. The inferior vena cava was normal in size with <50% respiratory  variability.     09/14/12 TTE Study Conclusions - Left ventricle: The cavity size was moderately dilated. Wall thickness was normal. Indeterminant diastolic function. The estimated ejection fraction was 20%. Diffuse hypokinesis. - Aortic valve: There was no stenosis. - Mitral valve: Mechanical mitral valve. No evidence for significant prosthetic stenosis. Trivial regurgitation. Mean gradient: 1mm Hg (D). - Left atrium: The atrium was moderately dilated. - Right ventricle: The cavity size was normal. Pacer wire or catheter noted in right ventricle. Systolic function was mildly to moderately reduced. - Right atrium: The atrium was mildly dilated. - Atrial septum: The septum bowed from left to right, consistent with increased left atrial pressure. - Tricuspid valve: Mild-moderate regurgitation. Peak RV-RA gradient:41mm Hg (S). - Pulmonary arteries: PA peak pressure: 71mm Hg (S). - Inferior vena cava: The vessel was normal in size; the respirophasic diameter changes were in the normal range (= 50%); findings are consistent with normal central venous pressure. Impressions - Moderately dilated LV with severe global hypokinesis, EF 20%. Mechanical mitral valve appears to function normally. Normal RV size with mild to moderately decreased systolic function. Mild pulmonary hypertension.  Recent Labs: 09/29/2019:  B Natriuretic Peptide 611.2 10/12/2019: BUN 15; Creatinine, Ser 0.98; Magnesium 1.9; Potassium 3.5; Sodium 140 10/13/2019: Hemoglobin 9.2; Platelets 222  No results found for requested labs within last 8760 hours.   CrCl cannot be calculated (Patient's most recent lab result is older than the maximum 21 days allowed.).   Wt Readings from Last 3 Encounters:  10/29/19 134 lb (60.8 kg)  10/28/19 116 lb (52.6 kg)  10/13/19 116 lb 6.5 oz (52.8 kg)  Other studies reviewed: Additional studies/records reviewed today include: summarized above  ASSESSMENT AND PLAN:  1. ICD     Intact function, no programming changes made  2. VT     NSVT and PVCs known for her     No recurrent VT     On amiodarone     Get BMET, mag   3. Permanant AFib     CHA2DS2Vasc is at least 5, on warfarin   4. Mechanical MVR     Last echo 2020 with proper function     On warfarin     She has had a number of embolic events, as well as a bleeding event unfortunately     Warfarin managed with her PMD  5. NICM 6. Chronic CHF (sytolic)     97 % BV pacing     On  Coreg, dig, lasix, BP is limiting, on midodrine     CorVue is below threshold, exam does not suggest volume OL, denies any SOB  7. HTN     Relative hypotension on midodrine  8. CP     Reproduced today with palpation of her chest wall    Disposition: she will come in the next week for labs, CMET, TSH,  dig level and mag, continue remotes as usual, see her back in 88mo, sooner if needed    Current medicines are reviewed at length with the patient today.  The patient did not have any concerns regarding medicines.  Venetia Night, PA-C 09/05/2020 8:06 PM     [address] [place] [id number] [phone] (office)  [phone] (fax)

## 2020-09-06 ENCOUNTER — Ambulatory Visit: Payer: Medicare Other | Admitting: Physician Assistant

## 2020-09-06 ENCOUNTER — Other Ambulatory Visit: Payer: Self-pay

## 2020-09-06 ENCOUNTER — Encounter: Payer: Self-pay | Admitting: Physician Assistant

## 2020-09-06 VITALS — BP 122/62 | HR 72 | Ht 66.0 in | Wt 134.0 lb

## 2020-09-06 DIAGNOSIS — I472 Ventricular tachycardia, unspecified: Secondary | ICD-10-CM

## 2020-09-06 DIAGNOSIS — Z79899 Other long term (current) drug therapy: Secondary | ICD-10-CM | POA: Diagnosis not present

## 2020-09-06 DIAGNOSIS — I428 Other cardiomyopathies: Secondary | ICD-10-CM

## 2020-09-06 DIAGNOSIS — I5023 Acute on chronic systolic (congestive) heart failure: Secondary | ICD-10-CM | POA: Diagnosis not present

## 2020-09-06 DIAGNOSIS — I4821 Permanent atrial fibrillation: Secondary | ICD-10-CM

## 2020-09-06 DIAGNOSIS — I5022 Chronic systolic (congestive) heart failure: Secondary | ICD-10-CM

## 2020-09-06 DIAGNOSIS — Z95810 Presence of automatic (implantable) cardiac defibrillator: Secondary | ICD-10-CM | POA: Diagnosis not present

## 2020-09-06 DIAGNOSIS — Z9889 Other specified postprocedural states: Secondary | ICD-10-CM

## 2020-09-06 NOTE — Patient Instructions (Addendum)
Medication Instructions:   Your physician recommends that you continue on your current medications as directed. Please refer to the Current Medication list given to you today.  *If you need a refill on your cardiac medications before your next appointment, please call your pharmacy*   Lab Work:    [facility] BMET MAG AND DIGOXIN ([facility] AFTER LAB)  If you have labs (blood work) drawn today and your tests are completely normal, you will receive your results only by: [name] MyChart Message (if you have MyChart) OR . A paper copy in the mail If you have any lab test that is abnormal or we need to change your treatment, we will call you to review the results.   Testing/Procedures: NONE ORDERED  TODAY   Follow-Up: At [facility], you and your health needs are our priority.  As part of our continuing mission to provide you with exceptional heart care, we have created designated Provider Care Teams.  These Care Teams include your primary Cardiologist (physician) and Advanced Practice Providers (APPs -  Physician Assistants and Nurse Practitioners) who all work together to provide you with the care you need, when you need it.  We recommend signing up for the patient portal called "MyChart".  Sign up information is provided on this After Visit Summary.  MyChart is used to connect with patients for Virtual Visits (Telemedicine).  Patients are able to view lab/test results, encounter notes, upcoming appointments, etc.  Non-urgent messages can be sent to your provider as well.   To learn more about what you can do with MyChart, go to [email or web address].    Your next appointment:   4 month(s)  The format for your next appointment:   In Person  Provider:   You may see Dr.Taylor  or one of the following Advanced Practice Providers on your designated Care Team:    Tommye Standard, [place]     Other Instructions

## 2020-09-07 NOTE — Addendum Note (Signed)
Addended by: Claude Manges on: 09/07/2020 10:23 AM   Modules accepted: Orders

## 2020-09-07 NOTE — Addendum Note (Signed)
Addended by: Claude Manges on: 09/07/2020 09:22 AM   Modules accepted: Orders

## 2020-09-13 ENCOUNTER — Other Ambulatory Visit: Payer: Self-pay

## 2020-09-13 ENCOUNTER — Other Ambulatory Visit: Payer: Medicare Other | Admitting: *Deleted

## 2020-09-13 DIAGNOSIS — Z79899 Other long term (current) drug therapy: Secondary | ICD-10-CM

## 2020-09-13 DIAGNOSIS — I472 Ventricular tachycardia, unspecified: Secondary | ICD-10-CM

## 2020-09-14 LAB — COMPREHENSIVE METABOLIC PANEL
ALT: 19 IU/L (ref 0–32)
AST: 25 IU/L (ref 0–40)
Albumin/Globulin Ratio: 1.6 (ref 1.2–2.2)
Albumin: 4.5 g/dL (ref 3.7–4.7)
Alkaline Phosphatase: 79 IU/L (ref 44–121)
BUN/Creatinine Ratio: 13 (ref 12–28)
BUN: 17 mg/dL (ref 8–27)
Bilirubin Total: 0.6 mg/dL (ref 0.0–1.2)
CO2: 20 mmol/L (ref 20–29)
Calcium: 9.9 mg/dL (ref 8.7–10.3)
Chloride: 104 mmol/L (ref 96–106)
Creatinine, Ser: 1.27 mg/dL — ABNORMAL HIGH (ref 0.57–1.00)
GFR calc Af Amer: 47 mL/min/{1.73_m2} — ABNORMAL LOW (ref >59)
GFR calc non Af Amer: 41 mL/min/{1.73_m2} — ABNORMAL LOW (ref >59)
Globulin, Total: 2.8 g/dL (ref 1.5–4.5)
Glucose: 105 mg/dL — ABNORMAL HIGH (ref 65–99)
Potassium: 4.8 mmol/L (ref 3.5–5.2)
Sodium: 141 mmol/L (ref 134–144)
Total Protein: 7.3 g/dL (ref 6.0–8.5)

## 2020-09-14 LAB — TSH: TSH: 1.98 u[IU]/mL (ref 0.450–4.500)

## 2020-09-14 LAB — DIGOXIN LEVEL: Digoxin, Serum: 0.5 ng/mL (ref 0.5–0.9)

## 2020-09-14 LAB — MAGNESIUM: Magnesium: 2.3 mg/dL (ref 1.6–2.3)

## 2020-09-22 ENCOUNTER — Other Ambulatory Visit: Payer: Self-pay | Admitting: *Deleted

## 2020-09-22 DIAGNOSIS — Z79899 Other long term (current) drug therapy: Secondary | ICD-10-CM

## 2020-09-22 NOTE — Progress Notes (Signed)
LABS ORDERED.

## 2020-10-04 ENCOUNTER — Other Ambulatory Visit: Payer: Medicare Other

## 2020-10-04 ENCOUNTER — Other Ambulatory Visit: Payer: Self-pay

## 2020-10-04 DIAGNOSIS — Z79899 Other long term (current) drug therapy: Secondary | ICD-10-CM

## 2020-10-04 LAB — BASIC METABOLIC PANEL
BUN/Creatinine Ratio: 28 (ref 12–28)
BUN: 26 mg/dL (ref 8–27)
CO2: 20 mmol/L (ref 20–29)
Calcium: 9.7 mg/dL (ref 8.7–10.3)
Chloride: 104 mmol/L (ref 96–106)
Creatinine, Ser: 0.92 mg/dL (ref 0.57–1.00)
GFR calc Af Amer: 70 mL/min/{1.73_m2} (ref >59)
GFR calc non Af Amer: 61 mL/min/{1.73_m2} (ref >59)
Glucose: 113 mg/dL — ABNORMAL HIGH (ref 65–99)
Potassium: 4.8 mmol/L (ref 3.5–5.2)
Sodium: 138 mmol/L (ref 134–144)

## 2020-10-12 ENCOUNTER — Encounter (HOSPITAL_COMMUNITY): Payer: Self-pay

## 2020-10-12 ENCOUNTER — Emergency Department (HOSPITAL_COMMUNITY): Payer: Medicare Other

## 2020-10-12 ENCOUNTER — Inpatient Hospital Stay (HOSPITAL_COMMUNITY): Payer: Medicare Other

## 2020-10-12 ENCOUNTER — Other Ambulatory Visit: Payer: Self-pay

## 2020-10-12 ENCOUNTER — Inpatient Hospital Stay (HOSPITAL_COMMUNITY)
Admission: EM | Admit: 2020-10-12 | Discharge: 2020-10-26 | DRG: 356 | Disposition: A | Discharge status: Home Health | Payer: Medicare Other | Attending: Internal Medicine | Admitting: Internal Medicine

## 2020-10-12 ENCOUNTER — Telehealth (HOSPITAL_COMMUNITY): Payer: Self-pay

## 2020-10-12 DIAGNOSIS — Z95810 Presence of automatic (implantable) cardiac defibrillator: Secondary | ICD-10-CM

## 2020-10-12 DIAGNOSIS — I5042 Chronic combined systolic (congestive) and diastolic (congestive) heart failure: Secondary | ICD-10-CM | POA: Diagnosis present

## 2020-10-12 DIAGNOSIS — Z681 Body mass index (BMI) 19 or less, adult: Secondary | ICD-10-CM | POA: Diagnosis not present

## 2020-10-12 DIAGNOSIS — I428 Other cardiomyopathies: Secondary | ICD-10-CM | POA: Diagnosis present

## 2020-10-12 DIAGNOSIS — E876 Hypokalemia: Secondary | ICD-10-CM | POA: Diagnosis not present

## 2020-10-12 DIAGNOSIS — K5731 Diverticulosis of large intestine without perforation or abscess with bleeding: Principal | ICD-10-CM | POA: Diagnosis present

## 2020-10-12 DIAGNOSIS — I9589 Other hypotension: Secondary | ICD-10-CM | POA: Diagnosis present

## 2020-10-12 DIAGNOSIS — D638 Anemia in other chronic diseases classified elsewhere: Secondary | ICD-10-CM | POA: Diagnosis present

## 2020-10-12 DIAGNOSIS — N170 Acute kidney failure with tubular necrosis: Secondary | ICD-10-CM | POA: Diagnosis present

## 2020-10-12 DIAGNOSIS — K625 Hemorrhage of anus and rectum: Secondary | ICD-10-CM

## 2020-10-12 DIAGNOSIS — Z9049 Acquired absence of other specified parts of digestive tract: Secondary | ICD-10-CM

## 2020-10-12 DIAGNOSIS — R413 Other amnesia: Secondary | ICD-10-CM | POA: Diagnosis present

## 2020-10-12 DIAGNOSIS — R578 Other shock: Secondary | ICD-10-CM | POA: Diagnosis present

## 2020-10-12 DIAGNOSIS — B961 Klebsiella pneumoniae [K. pneumoniae] as the cause of diseases classified elsewhere: Secondary | ICD-10-CM | POA: Diagnosis present

## 2020-10-12 DIAGNOSIS — F0390 Unspecified dementia without behavioral disturbance: Secondary | ICD-10-CM | POA: Diagnosis present

## 2020-10-12 DIAGNOSIS — Z7983 Long term (current) use of bisphosphonates: Secondary | ICD-10-CM

## 2020-10-12 DIAGNOSIS — I482 Chronic atrial fibrillation, unspecified: Secondary | ICD-10-CM | POA: Diagnosis not present

## 2020-10-12 DIAGNOSIS — D6959 Other secondary thrombocytopenia: Secondary | ICD-10-CM | POA: Diagnosis present

## 2020-10-12 DIAGNOSIS — K5733 Diverticulitis of large intestine without perforation or abscess with bleeding: Secondary | ICD-10-CM | POA: Diagnosis not present

## 2020-10-12 DIAGNOSIS — R791 Abnormal coagulation profile: Secondary | ICD-10-CM | POA: Diagnosis not present

## 2020-10-12 DIAGNOSIS — I495 Sick sinus syndrome: Secondary | ICD-10-CM | POA: Diagnosis present

## 2020-10-12 DIAGNOSIS — Z90710 Acquired absence of both cervix and uterus: Secondary | ICD-10-CM | POA: Diagnosis not present

## 2020-10-12 DIAGNOSIS — Z85038 Personal history of other malignant neoplasm of large intestine: Secondary | ICD-10-CM

## 2020-10-12 DIAGNOSIS — Z952 Presence of prosthetic heart valve: Secondary | ICD-10-CM

## 2020-10-12 DIAGNOSIS — K921 Melena: Secondary | ICD-10-CM | POA: Diagnosis not present

## 2020-10-12 DIAGNOSIS — Z86718 Personal history of other venous thrombosis and embolism: Secondary | ICD-10-CM

## 2020-10-12 DIAGNOSIS — Z7901 Long term (current) use of anticoagulants: Secondary | ICD-10-CM

## 2020-10-12 DIAGNOSIS — I4891 Unspecified atrial fibrillation: Secondary | ICD-10-CM | POA: Diagnosis present

## 2020-10-12 DIAGNOSIS — M810 Age-related osteoporosis without current pathological fracture: Secondary | ICD-10-CM | POA: Diagnosis present

## 2020-10-12 DIAGNOSIS — Z20822 Contact with and (suspected) exposure to covid-19: Secondary | ICD-10-CM | POA: Diagnosis present

## 2020-10-12 DIAGNOSIS — E440 Moderate protein-calorie malnutrition: Secondary | ICD-10-CM | POA: Diagnosis present

## 2020-10-12 DIAGNOSIS — I110 Hypertensive heart disease with heart failure: Secondary | ICD-10-CM | POA: Diagnosis present

## 2020-10-12 DIAGNOSIS — D62 Acute posthemorrhagic anemia: Secondary | ICD-10-CM | POA: Diagnosis present

## 2020-10-12 DIAGNOSIS — Z9221 Personal history of antineoplastic chemotherapy: Secondary | ICD-10-CM | POA: Diagnosis not present

## 2020-10-12 DIAGNOSIS — Z79899 Other long term (current) drug therapy: Secondary | ICD-10-CM | POA: Diagnosis not present

## 2020-10-12 DIAGNOSIS — F419 Anxiety disorder, unspecified: Secondary | ICD-10-CM | POA: Diagnosis present

## 2020-10-12 DIAGNOSIS — Z8673 Personal history of transient ischemic attack (TIA), and cerebral infarction without residual deficits: Secondary | ICD-10-CM

## 2020-10-12 DIAGNOSIS — I480 Paroxysmal atrial fibrillation: Secondary | ICD-10-CM | POA: Diagnosis present

## 2020-10-12 DIAGNOSIS — R739 Hyperglycemia, unspecified: Secondary | ICD-10-CM | POA: Diagnosis present

## 2020-10-12 DIAGNOSIS — R1030 Lower abdominal pain, unspecified: Secondary | ICD-10-CM

## 2020-10-12 DIAGNOSIS — R079 Chest pain, unspecified: Secondary | ICD-10-CM | POA: Diagnosis not present

## 2020-10-12 DIAGNOSIS — N136 Pyonephrosis: Secondary | ICD-10-CM | POA: Diagnosis present

## 2020-10-12 DIAGNOSIS — K7460 Unspecified cirrhosis of liver: Secondary | ICD-10-CM | POA: Diagnosis present

## 2020-10-12 DIAGNOSIS — K922 Gastrointestinal hemorrhage, unspecified: Secondary | ICD-10-CM

## 2020-10-12 DIAGNOSIS — R54 Age-related physical debility: Secondary | ICD-10-CM | POA: Diagnosis present

## 2020-10-12 DIAGNOSIS — G8929 Other chronic pain: Secondary | ICD-10-CM | POA: Diagnosis present

## 2020-10-12 HISTORY — PX: IR FLUORO GUIDE CV LINE RIGHT: IMG2283

## 2020-10-12 HISTORY — PX: IR US GUIDE VASC ACCESS RIGHT: IMG2390

## 2020-10-12 HISTORY — PX: IR US GUIDE VASC ACCESS LEFT: IMG2389

## 2020-10-12 HISTORY — PX: IR EMBO ART  VEN HEMORR LYMPH EXTRAV  INC GUIDE ROADMAPPING: IMG5450

## 2020-10-12 HISTORY — PX: IR ANGIOGRAM VISCERAL SELECTIVE: IMG657

## 2020-10-12 LAB — COMPREHENSIVE METABOLIC PANEL
ALT: 15 U/L (ref 0–44)
AST: 23 U/L (ref 15–41)
Albumin: 3.4 g/dL — ABNORMAL LOW (ref 3.5–5.0)
Alkaline Phosphatase: 47 U/L (ref 38–126)
Anion gap: 11 (ref 5–15)
BUN: 31 mg/dL — ABNORMAL HIGH (ref 8–23)
CO2: 21 mmol/L — ABNORMAL LOW (ref 22–32)
Calcium: 9.2 mg/dL (ref 8.9–10.3)
Chloride: 106 mmol/L (ref 98–111)
Creatinine, Ser: 1.39 mg/dL — ABNORMAL HIGH (ref 0.44–1.00)
GFR, Estimated: 39 mL/min — ABNORMAL LOW (ref >60)
Glucose, Bld: 215 mg/dL — ABNORMAL HIGH (ref 70–99)
Potassium: 4.7 mmol/L (ref 3.5–5.1)
Sodium: 138 mmol/L (ref 135–145)
Total Bilirubin: 0.7 mg/dL (ref 0.3–1.2)
Total Protein: 5.9 g/dL — ABNORMAL LOW (ref 6.5–8.1)

## 2020-10-12 LAB — HEMOGLOBIN AND HEMATOCRIT, BLOOD
HCT: 19.8 % — ABNORMAL LOW (ref 36.0–46.0)
Hemoglobin: 6.5 g/dL — CL (ref 12.0–15.0)

## 2020-10-12 LAB — PROTIME-INR
INR: 4.9 (ref 0.8–1.2)
INR: 1.9 — ABNORMAL HIGH (ref 0.8–1.2)
INR: 1.9 — ABNORMAL HIGH (ref 0.8–1.2)
Prothrombin Time: 44 seconds — ABNORMAL HIGH (ref 11.4–15.2)
Prothrombin Time: 21 seconds — ABNORMAL HIGH (ref 11.4–15.2)
Prothrombin Time: 20.7 seconds — ABNORMAL HIGH (ref 11.4–15.2)

## 2020-10-12 LAB — CBC
HCT: 42.4 % (ref 36.0–46.0)
HCT: 33.2 % — ABNORMAL LOW (ref 36.0–46.0)
HCT: 38.3 % (ref 36.0–46.0)
Hemoglobin: 12.9 g/dL (ref 12.0–15.0)
Hemoglobin: 9.9 g/dL — ABNORMAL LOW (ref 12.0–15.0)
Hemoglobin: 12.7 g/dL (ref 12.0–15.0)
MCH: 29.7 pg (ref 26.0–34.0)
MCH: 29.8 pg (ref 26.0–34.0)
MCH: 30.6 pg (ref 26.0–34.0)
MCHC: 30.4 g/dL (ref 30.0–36.0)
MCHC: 29.8 g/dL — ABNORMAL LOW (ref 30.0–36.0)
MCHC: 33.2 g/dL (ref 30.0–36.0)
MCV: 97.5 fL (ref 80.0–100.0)
MCV: 100 fL (ref 80.0–100.0)
MCV: 92.3 fL (ref 80.0–100.0)
Platelets: 187 10*3/uL (ref 150–400)
Platelets: 144 10*3/uL — ABNORMAL LOW (ref 150–400)
Platelets: 107 10*3/uL — ABNORMAL LOW (ref 150–400)
RBC: 4.35 MIL/uL (ref 3.87–5.11)
RBC: 3.32 MIL/uL — ABNORMAL LOW (ref 3.87–5.11)
RBC: 4.15 MIL/uL (ref 3.87–5.11)
RDW: 17.2 % — ABNORMAL HIGH (ref 11.5–15.5)
RDW: 17.2 % — ABNORMAL HIGH (ref 11.5–15.5)
RDW: 17.2 % — ABNORMAL HIGH (ref 11.5–15.5)
WBC: 11.3 10*3/uL — ABNORMAL HIGH (ref 4.0–10.5)
WBC: 11.7 10*3/uL — ABNORMAL HIGH (ref 4.0–10.5)
WBC: 18 10*3/uL — ABNORMAL HIGH (ref 4.0–10.5)
nRBC: 0 % (ref 0.0–0.2)
nRBC: 0 % (ref 0.0–0.2)
nRBC: 0 % (ref 0.0–0.2)

## 2020-10-12 LAB — PREPARE RBC (CROSSMATCH)

## 2020-10-12 LAB — ECHOCARDIOGRAM LIMITED
Height: 66 in
Weight: 2144.63 oz

## 2020-10-12 LAB — TROPONIN I (HIGH SENSITIVITY)
Troponin I (High Sensitivity): 120 ng/L (ref <18)
Troponin I (High Sensitivity): 112 ng/L (ref <18)
Troponin I (High Sensitivity): 119 ng/L (ref <18)

## 2020-10-12 LAB — RESP PANEL BY RT-PCR (FLU A&B, COVID) ARPGX2
Influenza A by PCR: NEGATIVE
Influenza B by PCR: NEGATIVE
SARS Coronavirus 2 by RT PCR: NEGATIVE

## 2020-10-12 LAB — POC OCCULT BLOOD, ED: Fecal Occult Bld: POSITIVE — AB

## 2020-10-12 LAB — GLUCOSE, CAPILLARY: Glucose-Capillary: 101 mg/dL — ABNORMAL HIGH (ref 70–99)

## 2020-10-12 MED ORDER — FENTANYL CITRATE (PF) 100 MCG/2ML IJ SOLN
INTRAMUSCULAR | Status: AC | PRN
  Administered 2020-10-12: 25 ug via INTRAVENOUS

## 2020-10-12 MED ORDER — SODIUM CHLORIDE 0.9 % IV SOLN
10.0000 mL/h | Freq: Once | INTRAVENOUS | Status: AC
  Administered 2020-10-12: 10 mL/h via INTRAVENOUS

## 2020-10-12 MED ORDER — MORPHINE SULFATE (PF) 2 MG/ML IV SOLN
1.0000 mg | INTRAVENOUS | Status: DC | PRN
  Administered 2020-10-13: 1 mg via INTRAVENOUS
  Filled 2020-10-12 (×2): qty 1

## 2020-10-12 MED ORDER — IOHEXOL 300 MG/ML  SOLN: 100.0000 mL | Freq: Once | INTRAMUSCULAR | Status: DC | PRN

## 2020-10-12 MED ORDER — ONDANSETRON HCL 4 MG PO TABS: 4.0000 mg | ORAL_TABLET | Freq: Four times a day (QID) | ORAL | Status: DC | PRN

## 2020-10-12 MED ORDER — OXYCODONE HCL 5 MG PO TABS: 5.0000 mg | ORAL_TABLET | Freq: Four times a day (QID) | ORAL | Status: DC | PRN

## 2020-10-12 MED ORDER — MIDODRINE HCL 5 MG PO TABS
10.0000 mg | ORAL_TABLET | Freq: Three times a day (TID) | ORAL | Status: DC
  Administered 2020-10-12 – 2020-10-26 (×43): 10 mg via ORAL
  Filled 2020-10-12 (×42): qty 2

## 2020-10-12 MED ORDER — LIDOCAINE HCL 1 % IJ SOLN
INTRAMUSCULAR | Status: AC | PRN
  Administered 2020-10-12: 20 mL via INTRADERMAL

## 2020-10-12 MED ORDER — CHLORHEXIDINE GLUCONATE CLOTH 2 % EX PADS
6.0000 | MEDICATED_PAD | Freq: Every day | CUTANEOUS | Status: DC
  Administered 2020-10-12 – 2020-10-17 (×6): 6 via TOPICAL

## 2020-10-12 MED ORDER — SODIUM CHLORIDE 0.9% IV SOLUTION: Freq: Once | INTRAVENOUS | Status: AC

## 2020-10-12 MED ORDER — SODIUM CHLORIDE 0.9% IV SOLUTION: Freq: Once | INTRAVENOUS | Status: DC

## 2020-10-12 MED ORDER — AMIODARONE HCL 200 MG PO TABS
200.0000 mg | ORAL_TABLET | Freq: Every day | ORAL | Status: DC
  Administered 2020-10-12 – 2020-10-26 (×15): 200 mg via ORAL
  Filled 2020-10-12 (×15): qty 1

## 2020-10-12 MED ORDER — DIGOXIN 125 MCG PO TABS
125.0000 ug | ORAL_TABLET | Freq: Every day | ORAL | Status: DC
  Administered 2020-10-12 – 2020-10-26 (×15): 125 ug via ORAL
  Filled 2020-10-12 (×15): qty 1

## 2020-10-12 MED ORDER — POLYETHYLENE GLYCOL 3350 17 G PO PACK
17.0000 g | PACK | Freq: Every day | ORAL | Status: DC | PRN
  Administered 2020-10-22 – 2020-10-26 (×3): 17 g via ORAL
  Filled 2020-10-12 (×3): qty 1

## 2020-10-12 MED ORDER — ONDANSETRON HCL 4 MG/2ML IJ SOLN
4.0000 mg | Freq: Four times a day (QID) | INTRAMUSCULAR | Status: DC | PRN
  Administered 2020-10-17: 4 mg via INTRAVENOUS
  Filled 2020-10-12: qty 2

## 2020-10-12 MED ORDER — SODIUM CHLORIDE 0.9 % IV BOLUS
1000.0000 mL | Freq: Once | INTRAVENOUS | Status: AC
  Administered 2020-10-12: 1000 mL via INTRAVENOUS

## 2020-10-12 MED ORDER — IOHEXOL 300 MG/ML  SOLN: 150.0000 mL | Freq: Once | INTRAMUSCULAR | Status: DC | PRN

## 2020-10-12 MED ORDER — ACETAMINOPHEN 325 MG PO TABS
650.0000 mg | ORAL_TABLET | Freq: Four times a day (QID) | ORAL | Status: DC | PRN
  Administered 2020-10-16 – 2020-10-17 (×2): 650 mg via ORAL
  Filled 2020-10-12 (×2): qty 2

## 2020-10-12 MED ORDER — LIDOCAINE HCL 1 % IJ SOLN
INTRAMUSCULAR | Status: AC
  Filled 2020-10-12: qty 20

## 2020-10-12 MED ORDER — MIDAZOLAM HCL 2 MG/2ML IJ SOLN
INTRAMUSCULAR | Status: AC
  Filled 2020-10-12: qty 2

## 2020-10-12 MED ORDER — MIDODRINE HCL 5 MG PO TABS
5.0000 mg | ORAL_TABLET | Freq: Two times a day (BID) | ORAL | Status: DC
  Administered 2020-10-12: 5 mg via ORAL
  Filled 2020-10-12: qty 1

## 2020-10-12 MED ORDER — ONDANSETRON HCL 4 MG/2ML IJ SOLN
4.0000 mg | Freq: Once | INTRAMUSCULAR | Status: AC
  Administered 2020-10-12: 4 mg via INTRAVENOUS
  Filled 2020-10-12: qty 2

## 2020-10-12 MED ORDER — LACTATED RINGERS IV BOLUS
500.0000 mL | Freq: Once | INTRAVENOUS | Status: AC
  Administered 2020-10-12: 500 mL via INTRAVENOUS

## 2020-10-12 MED ORDER — SODIUM CHLORIDE 0.9% FLUSH
3.0000 mL | Freq: Two times a day (BID) | INTRAVENOUS | Status: DC
  Administered 2020-10-12 (×2): 3 mL via INTRAVENOUS

## 2020-10-12 MED ORDER — HYDRALAZINE HCL 20 MG/ML IJ SOLN: 5.0000 mg | INTRAMUSCULAR | Status: DC | PRN

## 2020-10-12 MED ORDER — ACETAMINOPHEN 650 MG RE SUPP: 650.0000 mg | Freq: Four times a day (QID) | RECTAL | Status: DC | PRN

## 2020-10-12 MED ORDER — PHENYLEPHRINE HCL-NACL 10-0.9 MG/250ML-% IV SOLN
25.0000 ug/min | INTRAVENOUS | Status: DC
  Administered 2020-10-12 (×2): 35 ug/min via INTRAVENOUS
  Administered 2020-10-12 (×2): 25 ug/min via INTRAVENOUS
  Administered 2020-10-13: 7 ug/min via INTRAVENOUS
  Administered 2020-10-13: 30 ug/min via INTRAVENOUS
  Administered 2020-10-13: 25 ug/min via INTRAVENOUS
  Administered 2020-10-14: 20 ug/min via INTRAVENOUS
  Administered 2020-10-15 (×2): 35 ug/min via INTRAVENOUS
  Administered 2020-10-15 (×2): 25 ug/min via INTRAVENOUS
  Administered 2020-10-16: 30 ug/min via INTRAVENOUS
  Administered 2020-10-16: 20 ug/min via INTRAVENOUS
  Filled 2020-10-12 (×12): qty 250

## 2020-10-12 MED ORDER — MIDAZOLAM HCL 2 MG/2ML IJ SOLN
INTRAMUSCULAR | Status: AC | PRN
  Administered 2020-10-12 (×2): 0.5 mg via INTRAVENOUS

## 2020-10-12 MED ORDER — ALPRAZOLAM 0.25 MG PO TABS
0.2500 mg | ORAL_TABLET | Freq: Every evening | ORAL | Status: DC | PRN
  Administered 2020-10-13 – 2020-10-14 (×2): 0.25 mg via ORAL
  Filled 2020-10-12 (×2): qty 1

## 2020-10-12 MED ORDER — IOHEXOL 350 MG/ML SOLN
80.0000 mL | Freq: Once | INTRAVENOUS | Status: AC | PRN
  Administered 2020-10-12: 80 mL via INTRAVENOUS

## 2020-10-12 MED ORDER — LACTATED RINGERS IV SOLN: INTRAVENOUS | Status: DC

## 2020-10-12 MED ORDER — CHLORHEXIDINE GLUCONATE CLOTH 2 % EX PADS: 6.0000 | MEDICATED_PAD | Freq: Every day | CUTANEOUS | Status: DC

## 2020-10-12 MED ORDER — SODIUM CHLORIDE 0.9 % IV SOLN
8.0000 mg/h | INTRAVENOUS | Status: DC
  Administered 2020-10-12 – 2020-10-13 (×3): 8 mg/h via INTRAVENOUS
  Filled 2020-10-12 (×4): qty 80

## 2020-10-12 MED ORDER — SODIUM CHLORIDE 0.9 % IV SOLN: INTRAVENOUS | Status: DC

## 2020-10-12 MED ORDER — VITAMIN K1 10 MG/ML IJ SOLN
1.0000 mg | Freq: Once | INTRAVENOUS | Status: AC
  Administered 2020-10-12: 1 mg via INTRAVENOUS
  Filled 2020-10-12: qty 0.1

## 2020-10-12 MED ORDER — MORPHINE SULFATE (PF) 2 MG/ML IV SOLN: 2.0000 mg | INTRAVENOUS | Status: DC | PRN

## 2020-10-12 MED ORDER — FENTANYL CITRATE (PF) 100 MCG/2ML IJ SOLN
INTRAMUSCULAR | Status: AC
  Filled 2020-10-12: qty 2

## 2020-10-12 MED ORDER — SODIUM CHLORIDE 0.9 % IV SOLN
80.0000 mg | Freq: Once | INTRAVENOUS | Status: AC
  Administered 2020-10-12: 80 mg via INTRAVENOUS
  Filled 2020-10-12: qty 80

## 2020-10-12 NOTE — ED Provider Notes (Signed)
.  Critical Care Performed by: Ripley Fraise, MD Authorized by: Ripley Fraise, MD   Critical care provider statement:    Critical care time (minutes):  60   Critical care start time:  10/12/2020 5:00 AM   Critical care end time:  10/12/2020 6:00 AM   Critical care time was exclusive of:  Separately billable procedures and treating other patients   Critical care was necessary to treat or prevent imminent or life-threatening deterioration of the following conditions:  Circulatory failure and shock   Critical care was time spent personally by me on the following activities:  Evaluation of patient's response to treatment, examination of patient, review of old charts, re-evaluation of patient's condition, ordering and review of radiographic studies, ordering and review of laboratory studies, ordering and performing treatments and interventions and pulse oximetry   I assumed direction of critical care for this patient from another provider in my specialty: no        Ripley Fraise, MD 10/12/20 [phone]

## 2020-10-12 NOTE — H&P (Addendum)
History and Physical    Angela [name] VZD:[id number] DOB: 01/14/1946 DOA: 10/12/2020  PCP: Lorene Dy, MD Consultants:  Lovena Le - cardiology; Palliative care; Fields - vascular surgery; Fuller Plan - GI Patient coming from:  Home - lives with son (her husband died last year of COVID); NOK: Angela, [name], [phone]  Chief Complaint: GI bleeding  HPI: Angela [name] is a 75 y.o. female with medical history significant of chronic systolic CHF with AICD/pacemaker placement; rheumatic heart disease s/p MVR; dementia; colon cancer (1993); iliac artery embolus s/p embolectomy; CVA; and afib on Coumadin presenting with lower GI bleeding.  The patient is very hard of hearing with mild dementia and is a poor historian.  She reports mild lower abdominal pain and feeling very cold.  Her son reports that she was doing fine last night, saw her PCP recently.  Before her son went to work overnight, she needed assistance to the bathroom.  He noticed that there was blood in the commode.  He thinks she had 4 stools that were all bright red.  He knew that with her Coumadin that was a problem and she seemed a little weak.  Her chest was bothering her a bit and he was concerned the AICD might fire.  Her BP does run low normally.     ED Course:  Carryover, per Dr. Hal Hope:  75 year old known history of A. fib on anticoagulation presents with rectal bleeding. Initial blood pressure was low normal response of fluid. Vitamin K was given due to elevated INR. On-call [facility] Dr. Lyndel Safe was sent secure chat message.  Patient had a large bloody BM in the ER and became hypotensive - given IVF bolus and 2 units PRBC plus the vitamin K and BP is improved.  Review of Systems: As per HPI; otherwise review of systems reviewed and negative.   Ambulatory Status:  Ambulates with assistance  COVID Vaccine Status:  Unknown  Past Medical History:  Diagnosis Date  . Abnormality of gait 08/23/2014  . Arthritis   . Atrial  fibrillation ([place])    A.  Chronic Coumadin  . Cardiac arrest - ventricular fibrillation    A.  12/2007;  B. 01/2008 St. Jude Promote Bi-V ICD placed  . CVA (cerebral vascular accident) ([place])   . Diverticular disease   . DVT (deep venous thrombosis) ([place])   . 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 History of colon cancer    A.  1999 - T3, N1  chemotherapy  . Internal hemorrhoids   . Memory deficit 08/23/2014  . Nonischemic cardiomyopathy ([place])    a.  11/2001 - Cath - NL Cors; b. ECHO 09/22/11: EF 20%, MVR normal, moderate LAE, mild RAE c. ECHO (08/2012): ED 20%, diff HK, LA mod dilated, mild/mod TR, RV mild/mod decreased sys fx  . Rectal bleeding 04/28/2017  . Rheumatic heart disease    A. 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 Sick sinus syndrome ([place])    A.  s/p pacer in 1995.  B.    . Systolic CHF, chronic ([place])    A.  01/2008 Echo - EF 10-20%  . UTI (urinary tract infection) 01/19/2019    Past Surgical History:  Procedure Laterality Date  . ABDOMINAL HYSTERECTOMY    . AV NODE ABLATION N/A 09/23/2011   Procedure: AV NODE ABLATION;  Surgeon: Evans Lance, MD;  Location: [facility] CATH LAB;  Service: Cardiovascular;  Laterality: N/A;  .  BIV ICD GENERTAOR CHANGE OUT N/A 09/05/2014   Procedure: BIV ICD GENERTAOR CHANGE OUT;  Surgeon: Evans Lance, MD;  Location: [facility] CATH LAB;  Service: Cardiovascular;  Laterality: N/A;  . Biventricular AICD    . COLON SURGERY  1999  . EMBOLECTOMY Left 10/01/2019   Procedure: Left Popliteal Artery Thrombectomy with Vein Patch Angioplasty; Left Tibial Artery Thrombectomy;  Surgeon: Elam Dutch, MD;  Location: [facility] OR;  Service: Vascular;  Laterality: Left;  . History of echocardiogram  2003, 2007, 2009  . I & D EXTREMITY Left 10/03/2019   Procedure: EVACUATION OF HEMATOMA  LEFT LEG;  Surgeon: Elam Dutch, MD;  Location: [place];  Service: Vascular;  Laterality: Left;  . INCISION AND DRAINAGE  PERIRECTAL ABSCESS Right 11/28/2018   Procedure: IRRIGATION AND DEBRIDEMENT PERIRECTAL ABSCESS;  Surgeon: Greer Pickerel, MD;  Location: [place];  Service: General;  Laterality: Right;  . Mitral Valve Replacement, Bjork-Shiley valve  1983    Social History   Socioeconomic History  . Marital status: Married    Spouse name: Not on file  . Number of children: 1  . Years of education: Not on file  . Highest education level: Not on file  Occupational History  . Occupation: Retired    Fish farm manager: RETIRED  Tobacco Use  . Smoking status: Never Smoker  . Smokeless tobacco: Never Used  Vaping Use  . Vaping Use: Never used  Substance and Sexual Activity  . Alcohol use: No  . Drug use: No  . Sexual activity: Not Currently  Other Topics Concern  . Not on file  Social History Narrative   The patient is married and has 1 child.  Lives in [place] with husband.  She is retired from working at [employer].  She does not smoke or drink   Social Determinants of Health   Financial Resource Strain: Not on file  Food Insecurity: Not on file  Transportation Needs: Not on file  Physical Activity: Not on file  Stress: Not on file  Social Connections: Not on file  Intimate Partner Violence: Not on file    No Known Allergies  Family History  Problem Relation Age of Onset  . Diabetes Mother 78  . Coronary artery disease Mother        s/p cabg  . Lung cancer Father 49       smoker  . Coronary artery disease Sister        s/p cabg    Prior to Admission medications   Medication Sig Start Date End Date Taking? Authorizing Provider  alendronate (FOSAMAX) 70 MG tablet Take 70 mg by mouth once a week. 08/26/19  Yes [provider]  ALPRAZolam Duanne Moron) 0.25 MG tablet Take 0.25 mg by mouth at bedtime as needed. 10/19/19  Yes [provider]  amiodarone (PACERONE) 200 MG tablet Take 1 tablet (200 mg total) by mouth daily. 10/23/19  Yes FieldsJessy Oto, MD  carvedilol (COREG) 3.125 MG  tablet Take 1 tablet (3.125 mg total) by mouth 2 (two) times daily with a meal. 02/08/19  Yes Evans Lance, MD  digoxin (LANOXIN) 0.125 MG tablet Take 125 mcg by mouth daily. 08/26/19  Yes [provider]  furosemide (LASIX) 20 MG tablet Take 20 mg by mouth daily. 07/20/19  Yes [provider]  midodrine (PROAMATINE) 2.5 MG tablet Take 5 mg by mouth 2 (two) times daily. 09/28/20  Yes [provider]  oxyCODONE (OXY IR/ROXICODONE) 5 MG immediate release tablet  Take 1 tablet (5 mg total) by mouth every 6 (six) hours as needed for moderate pain. 10/08/19  Yes Dagoberto Ligas, PA-C  tiZANidine (ZANAFLEX) 4 MG tablet Take 4 mg by mouth daily as needed for muscle spasms.  09/28/19  Yes [provider]  warfarin (COUMADIN) 1 MG tablet Take 1 mg by mouth daily. Take 5 tablet (1 mg tablet) by mouth on Mon, Thurs   Yes [provider]  warfarin (COUMADIN) 5 MG tablet Take 5 mg by mouth daily. Take 6 ( 5mg  tablets) by mouth on Tues, wed, Fri, Sat, Sunday as directed   Yes [provider]  midodrine (PROAMATINE) 10 MG tablet Take 1 tablet (10 mg total) by mouth 3 (three) times daily. Patient not taking: Reported on 10/12/2020 10/13/19   Dagoberto Ligas, PA-C    Physical Exam: Vitals:   10/12/20 0745 10/12/20 0815 10/12/20 0830 10/12/20 0845  BP: (!) 81/59 (!) 79/55 (!) 75/55 99/60  Pulse:      Resp: 16 20 17 15   Temp:      TempSrc:      SpO2:      Weight:      Height:         . General:  Appears calm but shivering  . Eyes:  PERRL, EOMI, normal lids, iris . ENT:  Hard of hearing, grossly normal lips & tongue, mildly dry mm . Neck:  no LAD, masses or thyromegaly . Cardiovascular:  RRR, no m/r/g. No LE edema.  [name] Respiratory:   CTA bilaterally with no wheezes/rales/rhonchi.  Normal respiratory effort. . Abdomen:  soft, mild suprapubic TTP, ND . Skin:  no rash or induration seen on limited exam . Musculoskeletal:  grossly normal tone BUE/BLE, good  ROM, no bony abnormality . Psychiatric:  blunted mood and affect, speech fluent and appropriate, limited due to cognitive impairment plus hearing deficiency plus shivering . Neurologic:  Unable to effectively perform    Radiological Exams on Admission: Independently reviewed - see discussion in A/P where applicable  DG Chest Portable 1 View  Result Date: 10/12/2020 CLINICAL DATA:  Chest pain EXAM: PORTABLE CHEST 1 VIEW COMPARISON:  09/29/2019 FINDINGS: Marked chronic cardiomegaly. Biventricular pacer and mitral valve replacement. There is no edema, consolidation, effusion, or pneumothorax. IMPRESSION: Stable from 2021.  Cardiomegaly without failure. Electronically Signed   By: Monte Fantasia M.D.   On: 10/12/2020 06:20    EKG: Independently reviewed.  Ventricularly paced with rate 78; significant artifact   Labs on Admission: I have personally reviewed the available labs and imaging studies at the time of the admission.  Pertinent labs:   Glucose 215 BUN 31/Creatinine 1.39/GFR 30; 26/0.92/70 on 2/9 WBC 11.3 Hgb 12.9 at 0303 -> 9.9 at 0606 INR 4.9 HS troponin 120, 112 Heme positive   Assessment/Plan Principal Problem:   Acute GI bleeding Active Problems:   Atrial fibrillation (HCC)   Chronic combined systolic and diastolic heart failure (HCC)   Memory deficit   ICD (implantable cardioverter-defibrillator), biventricular, in situ   Hemorrhagic shock (HCC)   Acute lower GI bleeding -Patient with remote h/o colon cancer, T3N1, s/p R hemicolectomy and chemo -C-scope in 2010 showed moderate to severe diverticulosis -Virtual colonoscopy was performed in 2015 due to increased procedural risk and there was a poor prep -She was hospitalized in 2018 with hematochezia in the setting of INR 4.3 and was given vitamin K and 1 unit PRBC -She has had no further apparent f/u since -Presenting today with acute onset of  BRBPR overnight, approximately 5 stools thus far -Her most recent  stool in the ER was associated with hypotension and she was bolused, transfused, and given vitamin 5 -Differential to include bleeding hemorrhoids; bacterial infectious colitis with likely pathogen Campylobacter jejuni; colonic diverticula; and AVM - but most likely etiology is diverticular bleeding -Will admit to progressive care, but given hemorrhagic shock in the ER I have also asked PCCM to consult (particularly given her severely depressed EF) -Continue to monitor for recurrent bleeding -CTA is the best radiologic test for localization of GI bleeding, detecting bleeding of 0.3-0.5cc/min - this has been ordered and when she is considered to be hemodynamically stable she will go for this evaluation -If positive, IR intervention may be appropriate -GI consult has been requested  ABLA with hemorrhagic shock -Patient's hypotension to 70/47 was most likely caused by anemia secondary to lower GI bleeding.  -Her Hgb decreased from 12.9 to 9.9 while in the ER.  -Type and screen were done in ED.  -Two units of blood were ordered by ED.  -Monitor closely and follow cbc q4h, transfuse as necessary for Hbg <8 due to h/o cardiac disease. -She has had recurrent bloody BMs x 2 in the ER with hypotension; given her severely depressed EF  - she appears to need ICU hospitalization at this time -Continue Midodrine  Afib on Coumadin -INR is supratherapeutic at 4.9 and this is likely contributing to her bleed -She has been given Vitamin K  Chronic systolic CHF  -AICD/pacemaker placement due to severe LV dysfunction -Rate control with Amiodarone, Digoxin -Hold Coreg  Dementia -She does not appear to be taking medications for this issue at this time  H/o iliac artery embolus -s/p embolectomy x2 -Followed by Dr. Oneida Alar -Needs ongoing [facility] due to this issue as well as afib       Note: This patient has been tested and is pending for the novel coronavirus COVID-19.   Total critical care time: 65  minutes Critical care time was exclusive of separately billable procedures and treating other patients. Critical care was necessary to treat or prevent imminent or life-threatening deterioration. Critical care was time spent personally by me on the following activities: development of treatment plan with patient and/or surrogate as well as nursing, discussions with consultants, evaluation of patient's response to treatment, examination of patient, obtaining history from patient or surrogate, ordering and performing treatments and interventions, ordering and review of laboratory studies, ordering and review of radiographic studies, pulse oximetry and re-evaluation of patient's condition.    DVT prophylaxis: SCDs Code Status:  Full - confirmed with family Family Communication: None present; I spoke with the patient's son by telephone at the time of admission.  Disposition Plan:  The patient is from: home  Anticipated d/c is to: be determined   Anticipated d/c date will depend on clinical response to treatment, likely several days  Patient is currently: acutely ill Consults called: GI; PCCM Admission status: Admit - It is my clinical opinion that admission to INPATIENT is reasonable and necessary because of the expectation that this patient will require hospital care that crosses at least 2 midnights to treat this condition based on the medical complexity of the problems presented.  Given the aforementioned information, the predictability of an adverse outcome is felt to be significant.      Karmen Bongo MD [facility]   How to contact the [facility] Attending or Consulting provider [place] or covering provider during after hours [place], for this  patient?  1. Check the care team in [facility] and look for a) attending/consulting TR[facility] provider listed and b) the [facility] team listed 2. Log into [email or web address] and use [place]'s universal password to access. If you do not have the password, please  contact the hospital operator. 3. Locate the [facility] provider you are looking for under [facility] and page to a number that you can be directly reached. 4. If you still have difficulty reaching the provider, please page the [facility] (Director on Call) for the Hospitalists listed on amion for assistance.   10/12/2020, 9:21 AM

## 2020-10-12 NOTE — Progress Notes (Signed)
Patient arrived to 34m / ICU.  Mentation intact.  2nd unit PRBC infused.  Pending FFP for INR reversal.  CTA of abdomen / pelvis completed with concern for diverticular bleeding in the descending colon. BP stable in 96-04'V systolic, SR in 40'J (chronic hypotension). S/p first dose in hospital midodrine at 12:30pm.    Plan: -s/p vitamin K, infusion of FFP pending  -follow up INR, H/H changed to 2pm  -consider KCENTRA if continues to have bleeding (note mechanical mitral valve) -IR consulted for possible embolization  -neosynephrine for MAP >/= to [place], MSN, APRN, NP-C, AGACNP-BC [facility] 10/12/2020, 12:40 PM   Please see [email or web address] for pager details.   From 7A-7P if no response, please call [phone] After hours, please call ELink [phone]

## 2020-10-12 NOTE — ED Notes (Addendum)
Pt had two episodes of large bright red watery stools.

## 2020-10-12 NOTE — Consult Note (Signed)
NAME:  Angela [name], MRN:  [id number], DOB:  December 04, 1945, LOS: 0 ADMISSION DATE:  10/12/2020, CONSULTATION DATE:  10/12/2020 REFERRING MD:  Lorin Mercy, CHIEF COMPLAINT:  Rectal bleeding  Brief History:  33 yoF on coumadin for mechanical MVR and Afib presenting with weakness, left sided chest pain, and rectal bleeding since yesterday.  Hypotensive on arrival with INR 4.9.  Treated with IVF and vit K for reversal.  Hgb 9.2.  S/p  Transfusion of first unit of PRBC with ongoing bloody bowel movements in ER.  Blood pressures have remained marginal down to SBP 70's.  PCCM consulted for evaluation.   History of Present Illness:  HPI obtained mostly from medical chart review as patient is poor historian. Son unable to be reached by phone.   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  Lives at home with her son; uses a cane for ambulation at baseline.  I am unable to reach son by phone currently.  Patient reports several days of weakness and dark stools that started yesterday.  Last night, developed more bright rectal bleeding prompting son to call EMS given coumadin.  Also reports episode of dark emesis, periumbilical pain, and left sided chest pain.  Denies any fever, cough, or shortness of breath.  She is chronically on midodrine for chronic hypotension at baseline.  On arrival, SBP 68 which improved with IV fluid bolus. She has been sinus in the 70's, afebrile, and normal oxygen saturations.  Labs noted for Hgb 9.2, WBC 11.3, INR 4.9, PT 44, bicarb 21, glucose 215, BUN 31, sCr 1.39, low albumin/ protein, and tropon hs trend  which has been flat 120-112- 119.  She has been treated with vit K and transfused first of two units of PRBC.  She has had ongoing bloody bowel with clots movements in ER, 4 thus far, followed by hypotensive episodes which improve with fluids/ transfusion.  PCCM consulted for ICU evaluation.   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 rate 17, height 5\' 6"  (1.676 m), weight 60.8 kg, SpO2 98 %.        Intake/Output Summary (Last 24 hours) at 10/12/2020 0847 Last data filed at 10/12/2020 0736 Gross per 24 hour  Intake 1650 ml  Output --  Net 1650 ml   Filed Weights   10/12/20 0241  Weight: 60.8 kg   Examination: General:  Frail, chronically ill elderly female lying in  ER stretcher in NAD HEENT: MM pale, dry, pupils 3/reactive Neuro: Awake, alert to person, place, event, not year, MAE- no discernable weakness CV: vpaced  PULM:  Non labored, lungs clear GI: soft, hyperbs, non tenderness Extremities: cool/dry, no LE edema  Skin: no rashes   Resolved Hospital Problem list    Assessment & Plan:   Lower GIB, likely diverticular  Hx -remote h/o colon cancer, T3N1, s/p R hemicolectomy and chemo Hx mod to severe diverticulosis in 2010, hx of hematochezia in  2018 ABLA Supratherapeutic INR/ coagulapathy on coumadin - appreciate GI ([facility]) recs, goal for now is supportive care, correction of coagulopathy, and pending CTA abd/ pelvis to assess if would be candidate for embolization - will admit to ICU for close monitoring  - recheck INR at 1600 - goal INR with MVR 3-3.5, currently at 4.9 s/p vit k - s/p vit K, discussed with attending, will add 1 unit of FFP given ongoing bloody BMs, cautious given prior hx of embolic events - post PRBC transfusion H/H, getting 2nd unit PRBC, then q 8hr  - NPO except meds  - continue PPI gtt per GI recs - cautious fluid resuscitation given baseline EF 15-20%, CXR and lung fields clear at this time - daily coags/ CBC/ BMET - monitor mild leukocytosis, hold on abx at this time, pending CTA, ddx bacterial colitis remains in differential   Acute on Chronic hypotension exacerbated by ABLA  - midodrine 5mg  BID -> 10mg  TID - continue PRBC transfusion as above, trending H/H - goal MAP 60-65 - monitor strict I/O's  - may need low dose peripheral pressors   Afib HFrEF/ NICM EF 15-20% w/ ICD RHD s/p MVR on coumadin, goal INR 3-3.5 Hx of iliac artery embolus/ DVT - continue home amio and digoxin - check limited TTE given last TTE 2 yrs ago - holding coreg and lasix  - coumadin per pharmacy, s/p vit k and 1 unit FFP ordered, will need to monitor closely given hx of embolic events - check CXR in am   Mild AKI and NGMA - supportive care as above - Trend BMP / urinary output/ strict I/Os - Replace electrolytes as indicated - Avoid nephrotoxic agents, ensure adequate renal perfusion  Hx Dementia  Hx chronic pain  Anxiety with chronic benzo use - continue home low dose prn HS dose - hold home zanaflex and oxy, consider low dose fentanyl for pain if needed - delirium precautions    Hyperglycemia - check CBG q 4, add SSI if still > 180  Best practice (evaluated daily)  Diet: NPO  Pain/Anxiety/Delirium  protocol (if indicated): n/a VAP protocol (if indicated): n/a DVT prophylaxis: SCDs GI prophylaxis: PPI gtt  Glucose control: CBGs Mobility: BR Disposition:admit to ICU  Goals of Care:  Last date of multidisciplinary goals of care discussion: Family and staff present:  Summary of discussion:  Follow up goals of care discussion due:  Code Status: Full.  Unable to reach son by phone at this time.   Labs   CBC: Recent Labs  Lab 10/12/20 0303 10/12/20 0606  WBC 11.3* 11.7*  HGB 12.9 9.9*  HCT 42.4 33.2*  MCV 97.5 100.0  PLT 187 144*    Basic Metabolic Panel: Recent Labs  Lab 10/12/20 0303  NA 138  K 4.7  CL 106  CO2 21*  GLUCOSE 215*  BUN 31*  CREATININE 1.39*  CALCIUM 9.2   GFR: Estimated Creatinine Clearance: 32.2 mL/min (A) (by C-G formula based on SCr of 1.39  mg/dL (H)). Recent Labs  Lab 10/12/20 0303 10/12/20 0606  WBC 11.3* 11.7*    Liver Function Tests: Recent Labs  Lab 10/12/20 0303  AST 23  ALT 15  ALKPHOS 47  BILITOT 0.7  PROT 5.9*  ALBUMIN 3.4*   No results for input(s): LIPASE, AMYLASE in the last 168 hours. No results for input(s): AMMONIA in the last 168 hours.  ABG    Component Value Date/Time   PHART 7.511 (H) 01/30/2008 0355   PCO2ART 40.2 01/30/2008 0355   PO2ART 87.5 01/30/2008 0355   HCO3 31.7 (H) 01/30/2008 0355   TCO2 21 07/07/2012 0514   ACIDBASEDEF 0.3 01/29/2008 1005   O2SAT 97.2 01/30/2008 0355     Coagulation Profile: Recent Labs  Lab 10/12/20 0304  INR 4.9*    Cardiac Enzymes: No results for input(s): CKTOTAL, CKMB, CKMBINDEX, TROPONINI in the last 168 hours.  HbA1C: Hgb A1c MFr Bld  Date/Time Value Ref Range Status  11/28/2018 01:52 AM 6.0 (H) 4.8 - 5.6 % Final    Comment:    (NOTE) Pre diabetes:          5.7%-6.4% Diabetes:              >6.4% Glycemic control for   <7.0% adults with diabetes     CBG: No results for input(s): GLUCAP in the last 168 hours.  Review of Systems:   Limited HPI,  as above  Past Medical History:  She,  has a past medical history of Abnormality of gait (08/23/2014), Arthritis, Atrial fibrillation ([place]), Cardiac arrest - ventricular fibrillation, CVA (cerebral vascular accident) ([place]), Diverticular disease, DVT (deep venous thrombosis) ([place]), Embolus and thrombosis of iliac artery ([place]), History of colon cancer, Internal hemorrhoids, Memory deficit (08/23/2014), Nonischemic cardiomyopathy ([place]), Rectal bleeding (04/28/2017), Rheumatic heart disease, Sick sinus syndrome ([place]), Systolic CHF, chronic ([place]), and UTI (urinary tract infection) (01/19/2019).   Surgical History:   Past Surgical History:  Procedure Laterality Date  . ABDOMINAL HYSTERECTOMY    . AV NODE ABLATION N/A 09/23/2011   Procedure: AV NODE ABLATION;  Surgeon: Evans Lance, MD;  Location: [facility] CATH LAB;  Service: Cardiovascular;  Laterality: N/A;  . BIV ICD GENERTAOR CHANGE OUT N/A 09/05/2014   Procedure: BIV ICD GENERTAOR CHANGE OUT;  Surgeon: Evans Lance, MD;  Location: [facility] CATH LAB;  Service: Cardiovascular;  Laterality: N/A;  . Biventricular AICD    . COLON SURGERY  1999  . EMBOLECTOMY Left 10/01/2019   Procedure: Left Popliteal Artery Thrombectomy with Vein Patch Angioplasty; Left Tibial Artery Thrombectomy;  Surgeon: Elam Dutch, MD;  Location: [facility];  Service: Vascular;  Laterality: Left;  . History of echocardiogram  2003, 2007, 2009  . I & D EXTREMITY Left 10/03/2019   Procedure: EVACUATION OF HEMATOMA  LEFT LEG;  Surgeon: Elam Dutch, MD;  Location: [facility];  Service: Vascular;  Laterality: Left;  . INCISION AND DRAINAGE PERIRECTAL ABSCESS Right 11/28/2018   Procedure: IRRIGATION AND DEBRIDEMENT PERIRECTAL ABSCESS;  Surgeon: Greer Pickerel, MD;  Location: [facility];  Service: General;  Laterality: Right;  . Mitral Valve Replacement, Bjork-Shiley valve  1983     Social History:   reports that she has never smoked. She has never used smokeless tobacco. She reports that she does not  drink alcohol and does not use drugs.   Family History:  Her family history includes Coronary artery disease in her mother and sister; Diabetes (age of onset: 40) in her mother; Lung cancer (age of onset:  50) in her father.   Allergies No Known Allergies   Home Medications  Prior to Admission medications   Medication Sig Start Date End Date Taking? Authorizing Provider  alendronate (FOSAMAX) 70 MG tablet Take 70 mg by mouth once a week. 08/26/19  Yes [provider]  ALPRAZolam Duanne Moron) 0.25 MG tablet Take 0.25 mg by mouth at bedtime as needed. 10/19/19  Yes [provider]  amiodarone (PACERONE) 200 MG tablet Take 1 tablet (200 mg total) by mouth daily. 10/23/19  Yes FieldsJessy Oto, MD  carvedilol (COREG) 3.125 MG tablet Take 1 tablet (3.125 mg total) by mouth 2 (two) times daily with a meal. 02/08/19  Yes Evans Lance, MD  digoxin (LANOXIN) 0.125 MG tablet Take 125 mcg by mouth daily. 08/26/19  Yes [provider]  furosemide (LASIX) 20 MG tablet Take 20 mg by mouth daily. 07/20/19  Yes [provider]  midodrine (PROAMATINE) 2.5 MG tablet Take 5 mg by mouth 2 (two) times daily. 09/28/20  Yes [provider]  oxyCODONE (OXY IR/ROXICODONE) 5 MG immediate release tablet Take 1 tablet (5 mg total) by mouth every 6 (six) hours as needed for moderate pain. 10/08/19  Yes Dagoberto Ligas, PA-C  tiZANidine (ZANAFLEX) 4 MG tablet Take 4 mg by mouth daily as needed for muscle spasms.  09/28/19  Yes [provider]  warfarin (COUMADIN) 1 MG tablet Take 1 mg by mouth daily. Take 5 tablet (1 mg tablet) by mouth on Mon, Thurs   Yes [provider]  warfarin (COUMADIN) 5 MG tablet Take 5 mg by mouth daily. Take 6 ( 5mg  tablets) by mouth on Tues, wed, Fri, Sat, Sunday as directed   Yes [provider]     Critical care time:  60 mins     Kennieth Rad, ACNP [facility] 10/12/2020, 10:19 AM

## 2020-10-12 NOTE — Consult Note (Signed)
Chief Complaint: Patient was seen in consultation today for GI bleed  Referring Physician(s): Dr. Lamonte Sakai  Supervising Physician: Mir, Sharen Heck  Patient Status: [facility] - In-pt  History of Present Illness: Angela [name] is a 75 y.o. female with past medical history of chronic systolic CHF with AICD/pacemaker on chronic coumadin, rheumatic heart disease s/p MVR, hypotension on midodrine at home, dementia, colon cancer in 1993 with evidence of moderate-severe diverticulosis on colonoscopy in 2015 who presents with acute GI bleed.  History obtained from patient and son at bedside who report patient had a regular primary care doctor's visit yesterday which was overall uneventful.  She acutely developed bright red blood per rectum and presented to [facility] ED with HgB 9.9 and BP 88/69.  Her INR was found to be elevated at 4.9.  She was given Vitamin K in the ED. Patient resuscitated with fluid bolus and 2u PRBCs.   CTA Abdomen Pelvis: The CT angiogram is positive for GI bleeding involving the descending colon, presumably diverticular disease.  These above results will be called to the ordering clinician or representative by the Radiologist Assistant, and communication documented in the PACS or [employer].  Aortic Atherosclerosis (ICD10-I70.0).  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 Associated nonobstructing nephrolithiasis.  IR consulted for angiogram, possible embolization in patient with ongoing bloody stools, hypotension.  Case reviewed and approved by Dr. Dwaine Gale.   Spoke with patient and son at bedside.  Patient with limited participation in conversation.  Repeatedly states "I don't feel good.  I don't feel good.  Help me." BP 75/57 during assessment. Patient reports nausea and dry mouth.    Past Medical History:  Diagnosis Date  . Abnormality of gait 08/23/2014  . Arthritis   . Atrial  fibrillation ([place])    A.  Chronic Coumadin  . Cardiac arrest - ventricular fibrillation    A.  12/2007;  B. 01/2008 St. Jude Promote Bi-V ICD placed  . CVA (cerebral vascular accident) ([place])   . Diverticular disease   . DVT (deep venous thrombosis) ([place])   . 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 History of colon cancer    A.  1999 - T3, N1  chemotherapy  . Internal hemorrhoids   . Memory deficit 08/23/2014  . Nonischemic cardiomyopathy ([place])    a.  11/2001 - Cath - NL Cors; b. ECHO 09/22/11: EF 20%, MVR normal, moderate LAE, mild RAE c. ECHO (08/2012): ED 20%, diff HK, LA mod dilated, mild/mod TR, RV mild/mod decreased sys fx  . Rectal bleeding 04/28/2017  . Rheumatic heart disease    A. 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 Sick sinus syndrome ([place])    A.  s/p pacer in 1995.  B.    . Systolic CHF, chronic ([place])    A.  01/2008 Echo - EF 10-20%  . UTI (urinary tract infection) 01/19/2019    Past Surgical History:  Procedure Laterality Date  . ABDOMINAL HYSTERECTOMY    . AV NODE ABLATION N/A 09/23/2011   Procedure: AV NODE ABLATION;  Surgeon: Evans Lance, MD;  Location: [facility] CATH LAB;  Service: Cardiovascular;  Laterality: N/A;  . BIV ICD GENERTAOR CHANGE OUT N/A 09/05/2014   Procedure: BIV ICD GENERTAOR CHANGE OUT;  Surgeon: Evans Lance, MD;  Location: [facility] CATH LAB;  Service: Cardiovascular;  Laterality: N/A;  . Biventricular AICD    .  COLON SURGERY  1999  . EMBOLECTOMY Left 10/01/2019   Procedure: Left Popliteal Artery Thrombectomy with Vein Patch Angioplasty; Left Tibial Artery Thrombectomy;  Surgeon: Elam Dutch, MD;  Location: [facility] OR;  Service: Vascular;  Laterality: Left;  . History of echocardiogram  2003, 2007, 2009  . I & D EXTREMITY Left 10/03/2019   Procedure: EVACUATION OF HEMATOMA  LEFT LEG;  Surgeon: Elam Dutch, MD;  Location: [place];  Service: Vascular;  Laterality: Left;  . INCISION AND DRAINAGE  PERIRECTAL ABSCESS Right 11/28/2018   Procedure: IRRIGATION AND DEBRIDEMENT PERIRECTAL ABSCESS;  Surgeon: Greer Pickerel, MD;  Location: [place];  Service: General;  Laterality: Right;  . Mitral Valve Replacement, Bjork-Shiley valve  1983    Allergies: Patient has no known allergies.  Medications: Prior to Admission medications   Medication Sig Start Date End Date Taking? Authorizing Provider  alendronate (FOSAMAX) 70 MG tablet Take 70 mg by mouth once a week. 08/26/19  Yes [provider]  ALPRAZolam Duanne Moron) 0.25 MG tablet Take 0.25 mg by mouth at bedtime as needed. 10/19/19  Yes [provider]  amiodarone (PACERONE) 200 MG tablet Take 1 tablet (200 mg total) by mouth daily. 10/23/19  Yes FieldsJessy Oto, MD  carvedilol (COREG) 3.125 MG tablet Take 1 tablet (3.125 mg total) by mouth 2 (two) times daily with a meal. 02/08/19  Yes Evans Lance, MD  digoxin (LANOXIN) 0.125 MG tablet Take 125 mcg by mouth daily. 08/26/19  Yes [provider]  furosemide (LASIX) 20 MG tablet Take 20 mg by mouth daily. 07/20/19  Yes [provider]  midodrine (PROAMATINE) 2.5 MG tablet Take 5 mg by mouth 2 (two) times daily. 09/28/20  Yes [provider]  oxyCODONE (OXY IR/ROXICODONE) 5 MG immediate release tablet Take 1 tablet (5 mg total) by mouth every 6 (six) hours as needed for moderate pain. 10/08/19  Yes Dagoberto Ligas, PA-C  tiZANidine (ZANAFLEX) 4 MG tablet Take 4 mg by mouth daily as needed for muscle spasms.  09/28/19  Yes [provider]  warfarin (COUMADIN) 1 MG tablet Take 1 mg by mouth daily. Take 5 tablet (1 mg tablet) by mouth on Mon, Thurs   Yes [provider]  warfarin (COUMADIN) 5 MG tablet Take 5 mg by mouth daily. Take 6 ( 5mg  tablets) by mouth on Tues, wed, Fri, Sat, Sunday as directed   Yes [provider]     Family History  Problem Relation Age of Onset  . Diabetes Mother 97  . Coronary artery disease Mother        s/p  cabg  . Lung cancer Father 11       smoker  . Coronary artery disease Sister        s/p cabg    Social History   Socioeconomic History  . Marital status: Married    Spouse name: Not on file  . Number of children: 1  . Years of education: Not on file  . Highest education level: Not on file  Occupational History  . Occupation: Retired    Fish farm manager: RETIRED  Tobacco Use  . Smoking status: Never Smoker  . Smokeless tobacco: Never Used  Vaping Use  . Vaping Use: Never used  Substance and Sexual Activity  . Alcohol use: No  . Drug use: No  . Sexual activity: Not Currently  Other Topics Concern  . Not on file  Social History Narrative   The patient is married and has 1  child.  Lives in [place] with husband.  She is retired from working at [employer].  She does not smoke or drink   Social Determinants of Health   Financial Resource Strain: Not on file  Food Insecurity: Not on file  Transportation Needs: Not on file  Physical Activity: Not on file  Stress: Not on file  Social Connections: Not on file     Review of Systems: A 12 point ROS discussed and pertinent positives are indicated in the HPI above.  All other systems are negative.  Review of Systems  Constitutional: Positive for fatigue. Negative for fever.  Respiratory: Negative for cough and shortness of breath.   Cardiovascular: Negative for chest pain.  Gastrointestinal: Positive for abdominal pain and blood in stool. Negative for diarrhea, nausea and vomiting.  Genitourinary: Negative for dysuria.  Psychiatric/Behavioral: Negative for behavioral problems and confusion.    Vital Signs: BP (!) 83/58   Pulse 73   Temp (!) 96.6 F (35.9 C) (Axillary)   Resp 20   Ht 5\' 6"  (1.676 m)   Wt 134 lb 0.6 oz (60.8 kg)   SpO2 100%   BMI 21.63 kg/m   Physical Exam Vitals and nursing note reviewed.  Constitutional:      General: She is not in acute distress.    Appearance: Normal appearance. She is  ill-appearing.  HENT:     Mouth/Throat:     Mouth: Mucous membranes are moist.     Pharynx: Oropharynx is clear.  Cardiovascular:     Rate and Rhythm: Normal rate and regular rhythm.     Comments: St Jude's heart valve Pulmonary:     Effort: Pulmonary effort is normal. No respiratory distress.     Breath sounds: Normal breath sounds.  Abdominal:     General: Abdomen is flat.     Palpations: Abdomen is soft.  Skin:    General: Skin is warm and dry.  Neurological:     General: No focal deficit present.     Mental Status: She is alert and oriented to person, place, and time. Mental status is at baseline.  Psychiatric:        Mood and Affect: Mood normal.        Behavior: Behavior normal.        Thought Content: Thought content normal.        Judgment: Judgment normal.      MD Evaluation Airway: WNL Heart: WNL Abdomen: WNL Chest/ Lungs: WNL ASA  Classification: 3 Mallampati/Airway Score: One   Imaging: DG Chest Portable 1 View  Result Date: 10/12/2020 CLINICAL DATA:  Chest pain EXAM: PORTABLE CHEST 1 VIEW COMPARISON:  09/29/2019 FINDINGS: Marked chronic cardiomegaly. Biventricular pacer and mitral valve replacement. There is no edema, consolidation, effusion, or pneumothorax. IMPRESSION: Stable from 2021.  Cardiomegaly without failure. Electronically Signed   By: Monte Fantasia M.D.   On: 10/12/2020 06:20   CT Angio Abd/Pel w/ and/or w/o  Result Date: 10/12/2020 CLINICAL DATA:  75 year old female with a history of melena EXAM: CTA ABDOMEN AND PELVIS WITHOUT AND WITH CONTRAST TECHNIQUE: Multidetector CT imaging of the abdomen and pelvis was performed using the standard protocol during bolus administration of intravenous contrast. Multiplanar reconstructed images and MIPs were obtained and reviewed to evaluate the vascular anatomy. CONTRAST:  43mL OMNIPAQUE IOHEXOL 350 MG/ML SOLN COMPARISON:  11/26/2018 FINDINGS: VASCULAR Aorta: Mild atherosclerotic changes of the abdominal  aorta. No aneurysm or dissection. No periaortic fluid. Celiac: Celiac artery patent. Minimal atherosclerosis. The  configuration and diameter of the celiac artery proximally suggests compression from the overlying diaphragmatic cruise. SMA: SMA patent with no significant atherosclerotic changes. Renals: - Right: Single right renal artery. No significant calcified plaque at the origin. Small caliber right renal artery. - Left: Single left renal artery. No significant atherosclerotic changes. IMA: IMA is patent. Right lower extremity: Unremarkable course, caliber, and contour of the right iliac system. No aneurysm, dissection, or occlusion. Minimal atherosclerosis of the right iliac system. Hypogastric artery is patent. Common femoral artery patent. Postsurgical changes adjacent to the right common femoral artery without focal fluid or evidence pseudoaneurysm. Proximal SFA and profunda femoris patent. Left lower extremity: Unremarkable course, caliber, and contour of the left iliac system. No aneurysm, dissection, or occlusion. Minimal atherosclerosis of the left iliac system. Hypogastric artery is patent. Common femoral artery patent. Proximal SFA and profunda femoris patent. Veins: Unremarkable appearance of the venous system. Review of the MIP images confirms the above findings. NON-VASCULAR Lower chest: Cardiomegaly, incompletely imaged. Cardiac pacing leads are present Hepatobiliary: Unremarkable appearance of the liver. Noncalcified cholelithiasis is evident. No focal inflammatory changes. Pancreas: Unremarkable. Spleen: Unremarkable. Adrenals/Urinary Tract: - Right adrenal gland: Unremarkable - Left adrenal gland: Unremarkable. - Right kidney: Enlargement of the right renal pelvis and collecting system, similar to the comparison CT. Nonobstructing right-sided nephrolithiasis. There appears to be ureteropelvic junction narrowing given the transition. Right renal cortical thinning. - Left Kidney: No left-sided  hydronephrosis or nephrolithiasis. No inflammatory changes. Unremarkable course of the left ureter. Small low-density and nonenhancing lesions, most likely cyst though too small to characterize. - Urinary Bladder: Distension of the urinary bladder. Stomach/Bowel: - Stomach: Unremarkable. - Small bowel: Unremarkable - Appendix: Surgical changes in the cecum. Appendix is not visualized, however, no inflammatory changes are present adjacent to the cecum to indicate an appendicitis. - Colon: Surgical changes at the cecum. Diverticula present within transverse colon, descending colon, sigmoid colon. The splenic flexure descending colon, and the rectum are fluid-filled. There is evidence extravasation of contrast involving the descending colon given the appearance of the density on the arterial phase and intensification on the delayed phase imaging. Lymphatic: No lymphadenopathy Mesenteric: No free fluid or air. No mesenteric adenopathy. Reproductive: Hysterectomy Other: No hernia. Musculoskeletal: No acute displaced fracture. Degenerative changes of the spine. Degenerative changes of the hips. No aggressive lytic lesion or sclerotic lesion identified. IMPRESSION: The CT angiogram is positive for GI bleeding involving the descending colon, presumably diverticular disease. These above results will be called to the ordering clinician or representative by the Radiologist Assistant, and communication documented in the PACS or [employer]. Aortic Atherosclerosis (ICD10-I70.0). 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 Associated nonobstructing nephrolithiasis. Additional ancillary findings as above. Signed, Dulcy Fanny. Dellia Nims, RPVI [facility] [address] [facility] Radiology Electronically Signed   By: Corrie Mckusick D.O.   On: 10/12/2020 11:36    Labs:  CBC: Recent Labs    10/12/20 0303  10/12/20 0606  WBC 11.3* 11.7*  HGB 12.9 9.9*  HCT 42.4 33.2*  PLT 187 144*    COAGS: Recent Labs    10/12/20 0304  INR 4.9*    BMP: Recent Labs    09/13/20 1142 10/04/20 1202 10/12/20 0303  NA 141 138 138  K 4.8 4.8 4.7  CL 104 104 106  CO2 20 20 21*  GLUCOSE 105* 113* 215*  BUN 17 26 31*  CALCIUM 9.9 9.7 9.2  CREATININE 1.27*  0.92 1.39*  GFRNONAA 41* 61 39*  GFRAA 47* 70  --     LIVER FUNCTION TESTS: Recent Labs    09/13/20 1142 10/12/20 0303  BILITOT 0.6 0.7  AST 25 23  ALT 19 15  ALKPHOS 79 47  PROT 7.3 5.9*  ALBUMIN 4.5 3.4*    TUMOR MARKERS: No results for input(s): AFPTM, CEA, CA199, CHROMGRNA in the last 8760 hours.  Assessment and Plan: GI bleed Patient admitted with BRBPR, ongoing bloody stools x3-4 since 12pm this afternoon per RN. History of moderate-severe diverticulosis.  Consult completed by GI.  CT Abdomen Pelvis shows active GI bleed of the descending colon.  Patient with ongoing bloody stools and hypotension this afternoon.  INR repeated and now 1.9.  Discussed with CCM and Dr. Dwaine Gale.  Proceed with angiogram and possible embolization this afternoon.  2 additional units of FFP have been order by CCM at Dr. Aurea Graff request.  Spoke with son for consent.   Risks and benefits were discussed with the patient including, but not limited to bleeding, infection, vascular injury or contrast induced renal failure.  This interventional procedure involves the use of X-rays and because of the nature of the planned procedure, it is possible that we will have prolonged use of X-ray fluoroscopy.  Potential radiation risks to you include (but are not limited to) the following: - A slightly elevated risk for cancer  several years later in life. This risk is typically less than 0.5% percent. This risk is low in comparison to the normal incidence of human cancer, which is 33% for women and 50% for men according to the [place]. - Radiation  induced injury can include skin redness, resembling a rash, tissue breakdown / ulcers and hair loss (which can be temporary or permanent).   The likelihood of either of these occurring depends on the difficulty of the procedure and whether you are sensitive to radiation due to previous procedures, disease, or genetic conditions.   IF your procedure requires a prolonged use of radiation, you will be notified and given written instructions for further action.  It is your responsibility to monitor the irradiated area for the 2 weeks following the procedure and to notify your physician if you are concerned that you have suffered a radiation induced injury.    All of the patient's questions were answered, patient is agreeable to proceed.  Consent signed and in chart.  Thank you for this interesting consult.  I greatly enjoyed meeting Jamielynn Wigley and look forward to participating in their care.  A copy of this report was sent to the requesting provider on this date.  Electronically Signed: Docia Barrier, PA 10/12/2020, 1:38 PM   I spent a total of 40 Minutes    in face to face in clinical consultation, greater than 50% of which was counseling/coordinating care for GI bleed

## 2020-10-12 NOTE — Progress Notes (Signed)
eLink Physician-Brief Progress Note Patient Name: Angela [name] DOB: 1946/05/27 MRN: [id number]   Date of Service  10/12/2020  HPI/Events of Note  Patient with GI bleeding s/p IR coiling, and with  acute blood loss anemia. Hemoglobin 6.5 gm / dl. Not actively bleeding currently.  eICU Interventions  1 unit PRBC ordered transfused.        Kerry Kass Zeba Luby 10/12/2020, 9:11 PM

## 2020-10-12 NOTE — ED Provider Notes (Signed)
[facility] EMERGENCY DEPARTMENT Provider Note   CSN: [id number] Arrival date & time: 10/12/20  0234     History Chief Complaint  Patient presents with  . GI Bleeding    Angela [name] is a 75 y.o. female.  With PMH significant for atrial fibrillation on Coumadin, CVA, HTN, and known diverticular disease who presents the ED via EMS for nausea and GI bleeding.  On my examination, patient reports that she has been taking her warfarin, as directed.  She has had 4 episodes of dark black stool today.  She also developed left-sided chest pain approximately 5 to 6 hours ago and is endorsing some generalized periumbilical discomfort.  She states that she is feeling particularly weak today.  I spoke with patient's son, Angela [name].  He states that at 10:30 PM he helped her to the bathroom and there was a large amount of red blood in her stool.  She told him that it just started today.  He denies any melena.  He called 911 given his concern that she is on blood thinners.  I mentioned that she is having chest discomfort, he suspects that it is related to her ICD.  He confirms that she is full code and he wants all measures taken.  HPI     Past Medical History:  Diagnosis Date  . Abnormality of gait 08/23/2014  . Arthritis   . Atrial fibrillation ([place])    A.  Chronic Coumadin  . Cardiac arrest - ventricular fibrillation    A.  12/2007;  B. 01/2008 St. Jude Promote Bi-V ICD placed  . CVA (cerebral vascular accident) ([place])   . Diverticular disease   . DVT (deep venous thrombosis) ([place])   . 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 History of colon cancer    A.  1999 - T3, N1  chemotherapy  . Internal hemorrhoids   . Memory deficit 08/23/2014  . Nonischemic cardiomyopathy ([place])    a.  11/2001 - Cath - NL Cors; b. ECHO 09/22/11: EF 20%, MVR normal, moderate LAE, mild RAE c. ECHO (08/2012): ED 20%, diff HK, LA mod dilated,  mild/mod TR, RV mild/mod decreased sys fx  . Rectal bleeding 04/28/2017  . Rheumatic heart disease    A. 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 Sick sinus syndrome ([place])    A.  s/p pacer in 1995.  B.    . Systolic CHF, chronic ([place])    A.  01/2008 Echo - EF 10-20%  . UTI (urinary tract infection) 01/19/2019    Patient Active Problem List   Diagnosis Date Noted  . Acute GI bleeding 10/12/2020  . Arterial occlusion   . Dizziness   . Sensation of cold in leg 10/01/2019  . 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 Acute on chronic combined systolic and diastolic CHF (congestive heart failure) ([place]) 01/22/2019  . Perianal pain   . Acute metabolic encephalopathy 67/20/9470  . UTI (urinary tract infection) 01/14/2019  . Protein-calorie malnutrition, severe 12/08/2018  . Goals of care, counseling/discussion   . 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 Syncope 11/26/2018  . VT (ventricular tachycardia) ([place]) 11/26/2018  . Elevated INR 11/26/2018  . Acute on chronic systolic (congestive) heart failure ([place]) 10/27/2018  . Elevated troponin 10/26/2018  . Anemia 10/26/2018  . Palliative care by specialist   . Rectal bleeding 04/28/2017  . Acute blood loss anemia 04/28/2017  .  ICD (implantable cardioverter-defibrillator), biventricular, in situ 08/24/2014  . Memory deficit 08/23/2014  . Abnormality of gait 08/23/2014  . Dementia ([place]) 12/17/2013  . 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 Hypotension due to drugs 07/12/2012  . Hypokalemia 07/12/2012  . NSVT (nonsustained ventricular tachycardia) ([place]) 07/12/2012  . Acute on chronic systolic heart failure ([place]) 07/07/2012  . Ventricular fibrillation ([place]) 11/11/2011  . Warfarin anticoagulation 09/22/2011  . Nonischemic cardiomyopathy ([place])   . Atrial fibrillation ([place])   . History of colon cancer   . Internal hemorrhoids   . Rheumatic heart disease   . Sick sinus syndrome ([place])   . Chronic combined systolic and  diastolic heart failure ([place])   . DIVERTICULAR DISEASE 06/05/2009    Past Surgical History:  Procedure Laterality Date  . ABDOMINAL HYSTERECTOMY    . AV NODE ABLATION N/A 09/23/2011   Procedure: AV NODE ABLATION;  Surgeon: Evans Lance, MD;  Location: [facility] CATH LAB;  Service: Cardiovascular;  Laterality: N/A;  . BIV ICD GENERTAOR CHANGE OUT N/A 09/05/2014   Procedure: BIV ICD GENERTAOR CHANGE OUT;  Surgeon: Evans Lance, MD;  Location: [facility] CATH LAB;  Service: Cardiovascular;  Laterality: N/A;  . Biventricular AICD    . COLON SURGERY  1999  . EMBOLECTOMY Left 10/01/2019   Procedure: Left Popliteal Artery Thrombectomy with Vein Patch Angioplasty; Left Tibial Artery Thrombectomy;  Surgeon: Elam Dutch, MD;  Location: [facility] OR;  Service: Vascular;  Laterality: Left;  . History of echocardiogram  2003, 2007, 2009  . I & D EXTREMITY Left 10/03/2019   Procedure: EVACUATION OF HEMATOMA  LEFT LEG;  Surgeon: Elam Dutch, MD;  Location: [facility];  Service: Vascular;  Laterality: Left;  . INCISION AND DRAINAGE PERIRECTAL ABSCESS Right 11/28/2018   Procedure: IRRIGATION AND DEBRIDEMENT PERIRECTAL ABSCESS;  Surgeon: Greer Pickerel, MD;  Location: [facility];  Service: General;  Laterality: Right;  . Mitral Valve Replacement, Bjork-Shiley valve  1983     OB History   No obstetric history on file.     Family History  Problem Relation Age of Onset  . Diabetes Mother 21  . Coronary artery disease Mother        s/p cabg  . Lung cancer Father 2       smoker  . Coronary artery disease Sister        s/p cabg    Social History   Tobacco Use  . Smoking status: Never Smoker  . Smokeless tobacco: Never Used  Vaping Use  . Vaping Use: Never used  Substance Use Topics  . Alcohol use: No  . Drug use: No    Home Medications Prior to Admission medications   Medication Sig Start Date End Date Taking? Authorizing Provider  alendronate (FOSAMAX) 70 MG tablet Take 70 mg by mouth once a week. 08/26/19   Yes [provider]  ALPRAZolam Duanne Moron) 0.25 MG tablet Take 0.25 mg by mouth at bedtime as needed. 10/19/19  Yes [provider]  amiodarone (PACERONE) 200 MG tablet Take 1 tablet (200 mg total) by mouth daily. 10/23/19  Yes FieldsJessy Oto, MD  carvedilol (COREG) 3.125 MG tablet Take 1 tablet (3.125 mg total) by mouth 2 (two) times daily with a meal. 02/08/19  Yes Evans Lance, MD  digoxin (LANOXIN) 0.125 MG tablet Take 125 mcg by mouth daily. 08/26/19  Yes [provider]  furosemide (LASIX) 20 MG tablet Take 20 mg by mouth daily. 07/20/19  Yes [provider]  midodrine (PROAMATINE) 2.5 MG tablet Take 5 mg by mouth 2 (two) times daily. 09/28/20  Yes [provider]  oxyCODONE (OXY IR/ROXICODONE) 5 MG immediate release tablet Take 1 tablet (5 mg total) by mouth every 6 (six) hours as needed for moderate pain. 10/08/19  Yes Dagoberto Ligas, PA-C  tiZANidine (ZANAFLEX) 4 MG tablet Take 4 mg by mouth daily as needed for muscle spasms.  09/28/19  Yes [provider]  warfarin (COUMADIN) 1 MG tablet Take 1 mg by mouth daily. Take 5 tablet (1 mg tablet) by mouth on Mon, Thurs   Yes [provider]  warfarin (COUMADIN) 5 MG tablet Take 5 mg by mouth daily. Take 6 ( 5mg  tablets) by mouth on Tues, wed, Fri, Sat, Sunday as directed   Yes [provider]  midodrine (PROAMATINE) 10 MG tablet Take 1 tablet (10 mg total) by mouth 3 (three) times daily. Patient not taking: Reported on 10/12/2020 10/13/19   Dagoberto Ligas, PA-C    Allergies    Patient has no known allergies.  Review of Systems   Review of Systems  All other systems reviewed and are negative.   Physical Exam Updated Vital Signs BP 91/65   Pulse 74   Resp 19   Ht 5\' 6"  (1.676 m)   Wt 60.8 kg   SpO2 98%   BMI 21.63 kg/m   Physical Exam Vitals and nursing note reviewed. Exam conducted with a chaperone present.  Constitutional:      Appearance: She is  ill-appearing.  HENT:     Head: Normocephalic and atraumatic.  Eyes:     General: No scleral icterus.    Conjunctiva/sclera: Conjunctivae normal.  Cardiovascular:     Rate and Rhythm: Normal rate.     Pulses: Normal pulses.  Pulmonary:     Effort: Pulmonary effort is normal. No respiratory distress.     Breath sounds: No wheezing or rales.  Abdominal:     General: Abdomen is flat. There is no distension.     Palpations: Abdomen is soft.     Tenderness: There is abdominal tenderness. There is no guarding.     Comments: Mild epigastric/periumbilical TTP.  Soft, nondistended.  No guarding.  No overlying skin changes.  Skin:    General: Skin is dry.     Capillary Refill: Capillary refill takes less than 2 seconds.  Neurological:     General: No focal deficit present.     Mental Status: She is alert and oriented to person, place, and time.     GCS: GCS eye subscore is 4. GCS verbal subscore is 5. GCS motor subscore is 6.  Psychiatric:        Mood and Affect: Mood normal.        Behavior: Behavior normal.        Thought Content: Thought content normal.     ED Results / Procedures / Treatments   Labs (all labs ordered are listed, but only abnormal results are displayed) Labs Reviewed  COMPREHENSIVE METABOLIC PANEL - Abnormal; Notable for the following components:      Result Value   CO2 21 (*)    Glucose, Bld 215 (*)    BUN 31 (*)    Creatinine, Ser 1.39 (*)    Total Protein 5.9 (*)    Albumin 3.4 (*)    GFR, Estimated 39 (*)    All other components within normal limits  CBC - Abnormal; Notable for the following components:  WBC 11.3 (*)    RDW 17.2 (*)    All other components within normal limits  PROTIME-INR - Abnormal; Notable for the following components:   Prothrombin Time 44.0 (*)    INR 4.9 (*)    All other components within normal limits  POC OCCULT BLOOD, ED - Abnormal; Notable for the following components:   Fecal Occult Bld POSITIVE (*)    All other  components within normal limits  CBC  CBC  CBC  TYPE AND SCREEN  TROPONIN I (HIGH SENSITIVITY)  TROPONIN I (HIGH SENSITIVITY)    EKG EKG Interpretation  Date/Time:  Thursday October 12 2020 04:09:57 EST Ventricular Rate:  167 PR Interval:    QRS Duration: 169 QT Interval:  375 QTC Calculation: 484 R Axis:   83 Text Interpretation: Interpretation limited secondary to artifact Ventricular-paced rhythm Confirmed by Ripley Fraise [id number]) on 10/12/2020 4:15:18 AM   Radiology No results found.  Procedures Procedures   Medications Ordered in ED Medications  sodium chloride 0.9 % bolus 1,000 mL (0 mLs Intravenous Stopped 10/12/20 0502)    And  0.9 %  sodium chloride infusion ( Intravenous New Bag/Given 10/12/20 0504)  pantoprazole (PROTONIX) 80 mg in sodium chloride 0.9 % 100 mL IVPB (has no administration in time range)  pantoprazole (PROTONIX) 80 mg in sodium chloride 0.9 % 100 mL (0.8 mg/mL) infusion (has no administration in time range)  phytonadione (VITAMIN K) 1 mg in dextrose 5 % 50 mL IVPB (has no administration in time range)  ondansetron (ZOFRAN) injection 4 mg (4 mg Intravenous Given 10/12/20 0503)    ED Course  I have reviewed the triage vital signs and the nursing notes.  Pertinent labs & imaging results that were available during my care of the patient were reviewed by me and considered in my medical decision making (see chart for details).  Clinical Course as of 10/12/20 0607  Thu Oct 12, 2020  0450 INR(!!): 4.9 [GG]  0450 Hemoglobin: 12.9 [GG]  0542 Fecal Occult Blood, POC(!): POSITIVE [GG]    Clinical Course User Index [GG] Corena Herter, PA-C   MDM Rules/Calculators/A&P                          Angela [name] was evaluated in Emergency Department on 10/12/2020 for the symptoms described in the history of present illness. She was evaluated in the context of the global COVID-19 pandemic, which necessitated consideration that the patient might be at risk  for infection with the SARS-CoV-2 virus that causes COVID-19. Institutional protocols and algorithms that pertain to the evaluation of patients at risk for COVID-19 are in a state of rapid change based on information released by regulatory bodies including the CDC and federal and state organizations. These policies and algorithms were followed during the patient's care in the ED.  I personally reviewed patient's medical chart and all notes from triage and staff during today's encounter. I have also ordered and reviewed all labs and imaging that I felt to be medically necessary in the evaluation of this patient's complaints and with consideration of their physical exam. If needed, translation services were available and utilized.   Patient's history and physical exam is concerning for diverticular bleed in the setting of warfarin.  We will check laboratory work-up including PT/INR and regularly assess patient's blood pressure.  She will be given fluids and PRBC depending on her CBC.  She is agreeable to transfusion.    Patient with  mild bump in her creatinine and BUN when compared to recent labs.  Her INR also appears to be significantly elevated at 4.9.  Patient is a positive on the type and screen.  However, her hemoglobin is actually within normal limits and improved when compared to her baseline.  It is still early since her onset of loose bloody stools and it may be falsely elevated, however she does not appear to be in any acute distress.  There is no significant rectal bleeding or hemorrhage on my examination.  However, she does appear to have hematochezia and RBPR.    Given that she is high risk on warfarin with elevated INR to 4.9 and evidence of bleeding per rectum, symptomatic with weakness and feeling cold, will consult hospitalist for admission.  Gastroenterology does not need to be emergently consulted at this time.  I called down to the main lab regarding the troponins.  The individual I  spoke with is "trying to track them down".  She understands that there is not one but 2 that have been sent down.  She states that neither of them are currently in process, despite my work-up showing otherwise.  Spoke with RN and we will recollect.    I spoke with Dr. Hal Hope who will admit patient.  Final Clinical Impression(s) / ED Diagnoses Final diagnoses:  Rectal bleeding  Elevated INR    Rx / DC Orders ED Discharge Orders    None       Corena Herter, PA-C 10/12/20 [id number]    Ripley Fraise, MD 10/12/20 0630

## 2020-10-12 NOTE — ED Notes (Signed)
Pt had one episode of loose, bloody bowel movement with clots visualized.

## 2020-10-12 NOTE — ED Notes (Addendum)
Manual Pressure 68/42

## 2020-10-12 NOTE — ED Triage Notes (Signed)
Patient coming from home with [facility], reports she is having nausea and GI bleeding. States 4 episodes tonight

## 2020-10-12 NOTE — Progress Notes (Signed)
Reviewed with IR, patient pending for embolization.    Plan: Now INR / CBC Transfuse 2 units FFP now  Reviewed with RN    Noe Gens, MSN, APRN, NP-C, AGACNP-BC [facility] 10/12/2020, 2:24 PM   Please see [email or web address] for pager details.   From 7A-7P if no response, please call [phone] After hours, please call ELink [phone]

## 2020-10-12 NOTE — Progress Notes (Signed)
ANTICOAGULATION CONSULT NOTE - Initial Consult  Pharmacy Consult for Warfarin Indication: mechanical MVR  No Known Allergies  Patient Measurements: Height: 5\' 6"  (167.6 cm) Weight: 60.8 kg (134 lb 0.6 oz) IBW/kg (Calculated) : 59.3  Vital Signs: Temp: 97.5 F (36.4 C) (02/17 0927) Temp Source: Oral (02/17 0927) BP: 99/67 (02/17 0930) Pulse Rate: 72 (02/17 0930)  Labs: Recent Labs    10/12/20 0303 10/12/20 0304 10/12/20 0315 10/12/20 0448 10/12/20 0606  HGB 12.9  --   --   --  9.9*  HCT 42.4  --   --   --  33.2*  PLT 187  --   --   --  144*  LABPROT  --  44.0*  --   --   --   INR  --  4.9*  --   --   --   CREATININE 1.39*  --   --   --   --   TROPONINIHS  --   --  120* 112* 119*    Estimated Creatinine Clearance: 32.2 mL/min (A) (by C-G formula based on SCr of 1.39 mg/dL (H)).   Medical History: Past Medical History:  Diagnosis Date  . Abnormality of gait 08/23/2014  . Arthritis   . Atrial fibrillation ([place])    A.  Chronic Coumadin  . Cardiac arrest - ventricular fibrillation    A.  12/2007;  B. 01/2008 St. Jude Promote Bi-V ICD placed  . CVA (cerebral vascular accident) ([place])   . Diverticular disease   . DVT (deep venous thrombosis) ([place])   . 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 History of colon cancer    A.  1999 - T3, N1  chemotherapy  . Internal hemorrhoids   . Memory deficit 08/23/2014  . Nonischemic cardiomyopathy ([place])    a.  11/2001 - Cath - NL Cors; b. ECHO 09/22/11: EF 20%, MVR normal, moderate LAE, mild RAE c. ECHO (08/2012): ED 20%, diff HK, LA mod dilated, mild/mod TR, RV mild/mod decreased sys fx  . Rectal bleeding 04/28/2017  . Rheumatic heart disease    A. 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 Sick sinus syndrome ([place])    A.  s/p pacer in 1995.  B.    . Systolic CHF, chronic ([place])    A.  01/2008 Echo - EF 10-20%  . UTI (urinary tract infection) 01/19/2019    Assessment: 75 yo F  with presumed chronic afib and rheumatic VHD s/p mechanical MVR on warfarin PTA, presents with GIB. INR elevated 4.9 on admit, Hgb 12.9>9.9 this AM, pltc 144. Received 1 mg IV vitamin K on 2/17. Pharmacy asked to follow for future [facility] plans.  Goal of Therapy:  INR 2.5-3.5 Monitor platelets by anticoagulation protocol: Yes   Plan:  Continue to hold warfarin F/u 1600 INR Daily INR, CBC, s/sx bleeding  Richardine Service, PharmD, BCPS PGY2 Cardiology Pharmacy Resident Phone: [phone] 10/12/2020  10:44 AM  Please check [email or web address] for unit-specific pharmacy phone numbers.

## 2020-10-12 NOTE — Consult Note (Addendum)
[facility] Consult: 8:53 AM 10/12/2020  LOS: 0 days    Referring Provider: Dr. Lorin Mercy Primary Care Physician:  Lorene Dy, MD Primary Gastroenterologist:  Dr. Fuller Plan     Reason for Consultation: Painless Hematochezia.   HPI: Angela [name] is a 75 y.o. female.   PMH non-ischemic CM. EF 20% in 2014.  V fib arrest, s/p ICD. SSS, s/p pacemaker.  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  DVT.  A fib. AV node ablation 2013. Chronic Coumadin.  Osteoporosis on monthly Fosamax. .  CVA.  Dementia.  Thrombocytopenia, platelets to 120s-130s as far back as 2012.     Hypotension, on midodrine at baseline. Colon cancer, T3N1, right hemicolectomy 1999.  Polyp (lymphoid nodule) at [facility] in 2001.   No recurrent polyps on 2003, 2005 Colonoscopies.  04/2009 Colonoscopy: Moderately severe diverticulosis in the sigmoid to ascending.  Prior right hemi-colectomy.  4 mm sessile polyp (hyperplastic) in the descending colon.  Internal hemorrhoids 05/1998 EGD.  2 small antral polyps.   In 03/2014, Dr Fuller Plan felt, with pt 16 years out from colon cancer and multiple co-morbidities making for high risk sedation, that surveillance endoscopic colonoscopies not warranted.  Virtual colonoscopy 04/2014:  Multiple diverticula throughout the colon concentrated in rectosigmoid, descending colon.  No clinically significant polypoid lesion or constricting lesion seen.   Hematochezia, presumed diverticular bleed in setting of INR 4.3 in 04/2017.  Dr. Hilarie Fredrickson made the decision not to pursue colonoscopy as he was dubious she could complete or tolerate bowel prep with her advanced dementia.  She received vitamin K, bleeding resolved off anticoagulation, Hgb nadir 7.9  Came to the ED very early this morning.  Last night she developed bloody stools.   There is also reports of black stools.  Periumbilical discomfort.  Weakness.  Left-sided chest discomfort.  Nausea.  No bloody emesis reported.  Denies abdominal pain.  Says she normally has no issues with her bowel movements and does not see blood per rectum.  Appetite good. The ED she was unsteady, unable to ambulate and requiring maximum assistant for repositioning.  Large, watery bloody stools observed on several occasions since arrival.   Blood pressures now 70s over 50s.  Heart rate in the 70s.  No fever no hypoxia.  Hgb 9 point >> 12.9 >> 9.9.  Platelets 144. INR 4.9. AKI with BUN/creatinine 31/1.3. COVID-19 testing pending.  1 unit PRBCs given, a second unit ordered.  Past Medical History:  Diagnosis Date  . Abnormality of gait 08/23/2014  . Arthritis   . Atrial fibrillation ([place])    A.  Chronic Coumadin  . Cardiac arrest - ventricular fibrillation    A.  12/2007;  B. 01/2008 St. Jude Promote Bi-V ICD placed  . CVA (cerebral vascular accident) ([place])   . Diverticular disease   . DVT (deep venous thrombosis) ([place])   . 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 History of colon cancer    A.  1999 - T3, N1  chemotherapy  . Internal hemorrhoids   . Memory deficit 08/23/2014  . Nonischemic cardiomyopathy ([place])    a.  11/2001 - Cath - NL Cors; b. ECHO 09/22/11: EF 20%, MVR normal, moderate LAE, mild RAE c. ECHO (08/2012): ED 20%, diff HK, LA mod dilated, mild/mod TR, RV mild/mod decreased sys fx  . Rectal bleeding 04/28/2017  . Rheumatic heart disease    A. 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 Sick sinus syndrome ([place])    A.  s/p pacer in 1995.  B.    . Systolic CHF, chronic ([place])    A.  01/2008 Echo - EF 10-20%  . UTI (urinary tract infection) 01/19/2019    Past Surgical History:  Procedure Laterality Date  . ABDOMINAL HYSTERECTOMY    . AV NODE ABLATION N/A 09/23/2011   Procedure: AV NODE ABLATION;  Surgeon: Evans Lance,  MD;  Location: [facility] CATH LAB;  Service: Cardiovascular;  Laterality: N/A;  . BIV ICD GENERTAOR CHANGE OUT N/A 09/05/2014   Procedure: BIV ICD GENERTAOR CHANGE OUT;  Surgeon: Evans Lance, MD;  Location: [facility] CATH LAB;  Service: Cardiovascular;  Laterality: N/A;  . Biventricular AICD    . COLON SURGERY  1999  . EMBOLECTOMY Left 10/01/2019   Procedure: Left Popliteal Artery Thrombectomy with Vein Patch Angioplasty; Left Tibial Artery Thrombectomy;  Surgeon: Elam Dutch, MD;  Location: [facility] OR;  Service: Vascular;  Laterality: Left;  . History of echocardiogram  2003, 2007, 2009  . I & D EXTREMITY Left 10/03/2019   Procedure: EVACUATION OF HEMATOMA  LEFT LEG;  Surgeon: Elam Dutch, MD;  Location: [facility];  Service: Vascular;  Laterality: Left;  . INCISION AND DRAINAGE PERIRECTAL ABSCESS Right 11/28/2018   Procedure: IRRIGATION AND DEBRIDEMENT PERIRECTAL ABSCESS;  Surgeon: Greer Pickerel, MD;  Location: [facility];  Service: General;  Laterality: Right;  . Mitral Valve Replacement, Bjork-Shiley valve  1983    Prior to Admission medications   Medication Sig Start Date End Date Taking? Authorizing Provider  alendronate (FOSAMAX) 70 MG tablet Take 70 mg by mouth once a week. 08/26/19  Yes [provider]  ALPRAZolam Duanne Moron) 0.25 MG tablet Take 0.25 mg by mouth at bedtime as needed. 10/19/19  Yes [provider]  amiodarone (PACERONE) 200 MG tablet Take 1 tablet (200 mg total) by mouth daily. 10/23/19  Yes FieldsJessy Oto, MD  carvedilol (COREG) 3.125 MG tablet Take 1 tablet (3.125 mg total) by mouth 2 (two) times daily with a meal. 02/08/19  Yes Evans Lance, MD  digoxin (LANOXIN) 0.125 MG tablet Take 125 mcg by mouth daily. 08/26/19  Yes [provider]  furosemide (LASIX) 20 MG tablet Take 20 mg by mouth daily. 07/20/19  Yes [provider]  midodrine (PROAMATINE) 2.5 MG tablet Take 5 mg by mouth 2 (two) times daily. 09/28/20  Yes [provider]  oxyCODONE  (OXY IR/ROXICODONE) 5 MG immediate release tablet Take 1 tablet (5 mg total) by mouth every 6 (six) hours as needed for moderate pain. 10/08/19  Yes Dagoberto Ligas, PA-C  tiZANidine (ZANAFLEX) 4 MG tablet Take 4 mg by mouth daily as needed for muscle spasms.  09/28/19  Yes [provider]  warfarin (COUMADIN) 1 MG tablet Take 1 mg by mouth daily. Take 5 tablet (1 mg tablet) by mouth on Mon, Thurs   Yes [provider]  warfarin (COUMADIN) 5 MG tablet Take 5 mg by mouth daily. Take 6 ( 5mg  tablets) by mouth on  Tues, wed, Fri, Sat, Sunday as directed   Yes [provider]    Scheduled Meds: . sodium chloride   Intravenous Once  . amiodarone  200 mg Oral Daily  . digoxin  125 mcg Oral Daily  . midodrine  5 mg Oral BID WC  . sodium chloride flush  3 mL Intravenous Q12H   Infusions: . sodium chloride 125 mL/hr at 10/12/20 0504  . lactated ringers    . pantoprozole (PROTONIX) infusion 8 mg/hr (10/12/20 0703)   PRN Meds: acetaminophen **OR** acetaminophen, ALPRAZolam, morphine injection, ondansetron **OR** ondansetron (ZOFRAN) IV, oxyCODONE   Allergies as of 10/12/2020  . (No Known Allergies)    Family History  Problem Relation Age of Onset  . Diabetes Mother 47  . Coronary artery disease Mother        s/p cabg  . Lung cancer Father 39       smoker  . Coronary artery disease Sister        s/p cabg    Social History   Socioeconomic History  . Marital status: Married    Spouse name: Not on file  . Number of children: 1  . Years of education: Not on file  . Highest education level: Not on file  Occupational History  . Occupation: Retired    Fish farm manager: RETIRED  Tobacco Use  . Smoking status: Never Smoker  . Smokeless tobacco: Never Used  Vaping Use  . Vaping Use: Never used  Substance and Sexual Activity  . Alcohol use: No  . Drug use: No  . Sexual activity: Not Currently  Other Topics Concern  . Not on file  Social History Narrative   The  patient is married and has 1 child.  Lives in [place] with husband.  She is retired from working at [employer].  She does not smoke or drink   Social Determinants of Health   Financial Resource Strain: Not on file  Food Insecurity: Not on file  Transportation Needs: Not on file  Physical Activity: Not on file  Stress: Not on file  Social Connections: Not on file  Intimate Partner Violence: Not on file    REVIEW OF SYSTEMS: Constitutional: Feels tired ENT:  No nose bleeds Pulm: Denies shortness of breath CV:  No palpitations, no LE edema.  Persistent left-sided chest discomfort at rest. GU:  No hematuria, no frequency GI: See HPI. Heme: Denies excessive or unusual bleeding or bruising Transfusions: See HPI Neuro:  No headaches, no peripheral tingling or numbness.  No syncope.  No seizures Derm:  No itching, no rash or sores.  Endocrine:  No sweats or chills.  No polyuria or dysuria Immunization: Not queried. Travel:  None beyond local counties in last few months.    PHYSICAL EXAM: Vital signs in last 24 hours: Vitals:   10/12/20 0815 10/12/20 0830  BP: (!) 79/55 (!) 75/55  Pulse:    Resp: 20 17  Temp:    SpO2:     Wt Readings from Last 3 Encounters:  10/12/20 60.8 kg  09/06/20 60.8 kg  10/29/19 60.8 kg    General: Anxious, comfortable, looks somewhat chronically ill. Head: No facial asymmetry or swelling.  No signs of head trauma. Eyes: No conjunctival pallor.  No icterus Ears: Not hard of hearing Nose: No congestion or discharge Mouth: Upper dentures.  Mucosa is moist, pink, clear.  Tongue midline. Neck: No masses, no JVD Lungs: Clear bilaterally.  No labored breathing.  No cough Heart: Pacemaker/ICD apparatus  palpable bilaterally in the upper chest.  RRR. Abdomen: Soft, not distended.  No tenderness.  Active bowel sounds.  No HSM, masses, bruits, hernias..   Rectal: Deferred rectal exam.  [place] clots of blood seen in discarded bed pad.  Room smells of  GI bleed/melena. Musc/Skeltl: No joint redness, swelling or gross deformity. Extremities: No CCE. Neurologic: Alert.  Oriented to self, [place].  Could not tell me what year it was.  Follows commands.  Moves all 4 limbs.  No tremor. Skin: No rash, no sores, no itching. Tattoos: None observed   Psych: Anxious.    Intake/Output from previous day: 02/16 0701 - 02/17 0700 In: 1000 [IV Piggyback:1000] Out: -  Intake/Output this shift: Total I/O In: 650 [IV Piggyback:650] Out: -   LAB RESULTS: Recent Labs    10/12/20 0303 10/12/20 0606  WBC 11.3* 11.7*  HGB 12.9 9.9*  HCT 42.4 33.2*  PLT 187 144*   BMET Lab Results  Component Value Date   NA 138 10/12/2020   NA 138 10/04/2020   NA 141 09/13/2020   K 4.7 10/12/2020   K 4.8 10/04/2020   K 4.8 09/13/2020   CL 106 10/12/2020   CL 104 10/04/2020   CL 104 09/13/2020   CO2 21 (L) 10/12/2020   CO2 20 10/04/2020   CO2 20 09/13/2020   GLUCOSE 215 (H) 10/12/2020   GLUCOSE 113 (H) 10/04/2020   GLUCOSE 105 (H) 09/13/2020   BUN 31 (H) 10/12/2020   BUN 26 10/04/2020   BUN 17 09/13/2020   CREATININE 1.39 (H) 10/12/2020   CREATININE 0.92 10/04/2020   CREATININE 1.27 (H) 09/13/2020   CALCIUM 9.2 10/12/2020   CALCIUM 9.7 10/04/2020   CALCIUM 9.9 09/13/2020   LFT Recent Labs    10/12/20 0303  PROT 5.9*  ALBUMIN 3.4*  AST 23  ALT 15  ALKPHOS 47  BILITOT 0.7   PT/INR Lab Results  Component Value Date   INR 4.9 (HH) 10/12/2020   INR 2.0 (H) 10/13/2019   INR 1.6 (H) 10/12/2019   Hepatitis Panel No results for input(s): HEPBSAG, HCVAB, HEPAIGM, HEPBIGM in the last 72 hours. C-Diff No components found for: CDIFF Lipase     Component Value Date/Time   LIPASE 36 01/14/2019 1548    Drugs of Abuse  No results found for: LABOPIA, COCAINSCRNUR, LABBENZ, AMPHETMU, THCU, LABBARB   RADIOLOGY STUDIES: DG Chest Portable 1 View  Result Date: 10/12/2020 CLINICAL DATA:  Chest pain EXAM: PORTABLE CHEST 1 VIEW  COMPARISON:  09/29/2019 FINDINGS: Marked chronic cardiomegaly. Biventricular pacer and mitral valve replacement. There is no edema, consolidation, effusion, or pneumothorax. IMPRESSION: Stable from 2021.  Cardiomegaly without failure. Electronically Signed   By: Monte Fantasia M.D.   On: 10/12/2020 06:20     IMPRESSION:   *    Acute lower GI bleed, presumed diverticular bleed.  *    Blood loss anemia.  Status post 1 PRBC, a second is ordered.  *   Colon cancer 1999.  Status post resection and chemo.  Latest colonoscopy 2010 w hyperplastic polyps, diverticulosis.  No polyps.  As of 2015 Dr. Fuller Plan felt that being 16 years out from cancer and multiple comorbidities, colonoscopy surveillance not warranted.  Virtual colonoscopy 04/2014 with widespread diverticulosis.  *    Hypotension.  On midodrine at baseline for chronic hypotension.  *    Chronic Coumadin.  A. fib.  DVT.  Mechanical MVR. ICD in place for V. fib arrest.  PPM for SSS.  Received vitamin K.  CCM reluctant to administer FFP given her extensive history of thrombotic disease.    PLAN:     *   CTAP w angiography ordered.  Await findings.  Continue supportive care with fluids, blood products.  Serial labs.  CCM admitting.  *   Do not think the patient needs Protonix drip.  Would convert to twice daily PPI either IV or oral once current infusion is completed  *   Okay to have clear liquids.   Azucena Freed  10/12/2020, 8:53 AM Phone [phone]     Attending Physician Note   I have taken a history, examined the patient and reviewed the chart. I agree with the Advanced Practitioner's note, impression and recommendations.  Acute painless hematochezia with ABL anemia, supratherapuetic anticoagulation and hemodynamic instability. CTA shows bleeding in descending colon. She has known diverticular disease. She is high risk for procedures / sedation due to her comorbidities.   Hold Coumadin Vit K, FFP, volume resuscitation,  transfusions as indicated.  Trend CBC, PT/INR IR has been consulted for possible mesenteric angiogram with intervention.   Lucio Edward, MD FACG [phone]

## 2020-10-12 NOTE — Procedures (Signed)
Interventional Radiology Procedure Note  Procedure: Mesenteric angiogram through left groin access.  Indication: GI bleed.  Hypotension  Findings:  Active extravasation identified in the descending colon.  Distal coil embolization performed.  No extravasation after embolization.  Left common femoral artery access closed with Angioseal device.  Please refer to procedural dictation for full description.  Complications: None  EBL: < 10 mL  Miachel Roux, MD [phone]

## 2020-10-12 NOTE — ED Notes (Signed)
Pt unable to ambulate, unsteady on feet, maximum assist.

## 2020-10-12 NOTE — ED Notes (Addendum)
BP 56/46 (51) fluids increased to 947ml/hr bolus, pt repositioned to Trendelenburg, Manual pressure systolic of 64 unable to obtain diastolic pressure. Nyoka Cowden, PA made aware

## 2020-10-12 NOTE — ED Provider Notes (Signed)
When I rechecked on patient she had a large bloody bowel movement, became hypotensive.  Fluid bolus was ordered as well as 2 units packed red blood cells.  Also informed Dr. Hal Hope with the hospitalist service about this She is already receiving vitamin K Pressure is now improving into the upper 70s   Ripley Fraise, MD 10/12/20 [phone]

## 2020-10-12 NOTE — Progress Notes (Signed)
Echocardiogram 2D Echocardiogram has been performed.  Oneal Deputy Reno Clasby 10/12/2020, 12:54 PM

## 2020-10-13 ENCOUNTER — Inpatient Hospital Stay (HOSPITAL_COMMUNITY): Payer: Medicare Other

## 2020-10-13 DIAGNOSIS — K5733 Diverticulitis of large intestine without perforation or abscess with bleeding: Secondary | ICD-10-CM | POA: Diagnosis not present

## 2020-10-13 DIAGNOSIS — D62 Acute posthemorrhagic anemia: Secondary | ICD-10-CM | POA: Diagnosis not present

## 2020-10-13 DIAGNOSIS — K922 Gastrointestinal hemorrhage, unspecified: Secondary | ICD-10-CM | POA: Diagnosis not present

## 2020-10-13 LAB — GLUCOSE, CAPILLARY
Glucose-Capillary: 104 mg/dL — ABNORMAL HIGH (ref 70–99)
Glucose-Capillary: 117 mg/dL — ABNORMAL HIGH (ref 70–99)
Glucose-Capillary: 121 mg/dL — ABNORMAL HIGH (ref 70–99)
Glucose-Capillary: 92 mg/dL (ref 70–99)
Glucose-Capillary: 92 mg/dL (ref 70–99)
Glucose-Capillary: 98 mg/dL (ref 70–99)

## 2020-10-13 LAB — URINALYSIS, ROUTINE W REFLEX MICROSCOPIC
Bilirubin Urine: NEGATIVE
Glucose, UA: NEGATIVE mg/dL
Ketones, ur: NEGATIVE mg/dL
Nitrite: NEGATIVE
Protein, ur: NEGATIVE mg/dL
Specific Gravity, Urine: >1.046 — ABNORMAL HIGH (ref 1.005–1.030)
pH: 5 (ref 5.0–8.0)

## 2020-10-13 LAB — CBC
HCT: 24.3 % — ABNORMAL LOW (ref 36.0–46.0)
HCT: 23.1 % — ABNORMAL LOW (ref 36.0–46.0)
Hemoglobin: 7.8 g/dL — ABNORMAL LOW (ref 12.0–15.0)
Hemoglobin: 7.4 g/dL — ABNORMAL LOW (ref 12.0–15.0)
MCH: 30.1 pg (ref 26.0–34.0)
MCH: 30.1 pg (ref 26.0–34.0)
MCHC: 32.1 g/dL (ref 30.0–36.0)
MCHC: 32 g/dL (ref 30.0–36.0)
MCV: 93.8 fL (ref 80.0–100.0)
MCV: 93.9 fL (ref 80.0–100.0)
Platelets: 67 10*3/uL — ABNORMAL LOW (ref 150–400)
Platelets: 67 10*3/uL — ABNORMAL LOW (ref 150–400)
RBC: 2.59 MIL/uL — ABNORMAL LOW (ref 3.87–5.11)
RBC: 2.46 MIL/uL — ABNORMAL LOW (ref 3.87–5.11)
RDW: 16.5 % — ABNORMAL HIGH (ref 11.5–15.5)
RDW: 16.6 % — ABNORMAL HIGH (ref 11.5–15.5)
WBC: 13.4 10*3/uL — ABNORMAL HIGH (ref 4.0–10.5)
WBC: 13.5 10*3/uL — ABNORMAL HIGH (ref 4.0–10.5)
nRBC: 0 % (ref 0.0–0.2)
nRBC: 0 % (ref 0.0–0.2)

## 2020-10-13 LAB — BPAM FFP
Blood Product Expiration Date: 202202212359
Blood Product Expiration Date: 202202212359
ISSUE DATE / TIME: 202202171227
ISSUE DATE / TIME: 202202171423
Unit Type and Rh: 600
Unit Type and Rh: 6200

## 2020-10-13 LAB — BASIC METABOLIC PANEL
Anion gap: 7 (ref 5–15)
BUN: 23 mg/dL (ref 8–23)
CO2: 21 mmol/L — ABNORMAL LOW (ref 22–32)
Calcium: 7.2 mg/dL — ABNORMAL LOW (ref 8.9–10.3)
Chloride: 111 mmol/L (ref 98–111)
Creatinine, Ser: 0.89 mg/dL (ref 0.44–1.00)
GFR, Estimated: >60 mL/min (ref >60)
Glucose, Bld: 104 mg/dL — ABNORMAL HIGH (ref 70–99)
Potassium: 4.1 mmol/L (ref 3.5–5.1)
Sodium: 139 mmol/L (ref 135–145)

## 2020-10-13 LAB — MAGNESIUM: Magnesium: 1.6 mg/dL — ABNORMAL LOW (ref 1.7–2.4)

## 2020-10-13 LAB — PREPARE FRESH FROZEN PLASMA

## 2020-10-13 LAB — PROTIME-INR
INR: 1.6 — ABNORMAL HIGH (ref 0.8–1.2)
Prothrombin Time: 18.8 seconds — ABNORMAL HIGH (ref 11.4–15.2)

## 2020-10-13 LAB — HEMOGLOBIN AND HEMATOCRIT, BLOOD
HCT: 24.7 % — ABNORMAL LOW (ref 36.0–46.0)
Hemoglobin: 8.6 g/dL — ABNORMAL LOW (ref 12.0–15.0)

## 2020-10-13 LAB — PREPARE RBC (CROSSMATCH)

## 2020-10-13 LAB — PHOSPHORUS: Phosphorus: 2.9 mg/dL (ref 2.5–4.6)

## 2020-10-13 LAB — MRSA PCR SCREENING: MRSA by PCR: NEGATIVE

## 2020-10-13 MED ORDER — SODIUM CHLORIDE 0.9% IV SOLUTION: Freq: Once | INTRAVENOUS | Status: AC

## 2020-10-13 MED ORDER — PANTOPRAZOLE SODIUM 40 MG PO TBEC
40.0000 mg | DELAYED_RELEASE_TABLET | Freq: Two times a day (BID) | ORAL | Status: DC
  Administered 2020-10-13 – 2020-10-26 (×27): 40 mg via ORAL
  Filled 2020-10-13 (×27): qty 1

## 2020-10-13 MED ORDER — BOOST / RESOURCE BREEZE PO LIQD CUSTOM
1.0000 | Freq: Three times a day (TID) | ORAL | Status: DC
  Administered 2020-10-13 – 2020-10-26 (×23): 1 via ORAL
  Filled 2020-10-13 (×5): qty 1

## 2020-10-13 MED ORDER — SODIUM CHLORIDE 0.9% FLUSH: 10.0000 mL | INTRAVENOUS | Status: DC | PRN

## 2020-10-13 MED ORDER — SODIUM CHLORIDE 0.9% FLUSH
10.0000 mL | Freq: Two times a day (BID) | INTRAVENOUS | Status: DC
  Administered 2020-10-13 – 2020-10-16 (×7): 10 mL
  Administered 2020-10-16: 30 mL
  Administered 2020-10-17: 20 mL
  Administered 2020-10-17 – 2020-10-26 (×17): 10 mL

## 2020-10-13 MED ORDER — DICYCLOMINE HCL 10 MG PO CAPS
10.0000 mg | ORAL_CAPSULE | Freq: Three times a day (TID) | ORAL | Status: DC
  Administered 2020-10-13 – 2020-10-16 (×12): 10 mg via ORAL
  Filled 2020-10-13 (×14): qty 1

## 2020-10-13 MED ORDER — MAGNESIUM SULFATE IN D5W 1-5 GM/100ML-% IV SOLN
1.0000 g | Freq: Once | INTRAVENOUS | Status: AC
  Administered 2020-10-13: 1 g via INTRAVENOUS
  Filled 2020-10-13: qty 100

## 2020-10-13 NOTE — Progress Notes (Signed)
Entered in error

## 2020-10-13 NOTE — Progress Notes (Addendum)
NAME:  Angela [name], MRN:  [id number], DOB:  1945/09/23, LOS: 1 ADMISSION DATE:  10/12/2020, CONSULTATION DATE:  10/12/2020 REFERRING MD:  Lorin Mercy, CHIEF COMPLAINT:  Rectal bleeding  Brief History:  6 yoF on coumadin for mechanical MVR and Afib presenting with weakness, left sided chest pain, and rectal bleeding since 2/16.  Hypotensive on arrival with INR 4.9, Hgb 9.2. Transfused PRBCx2, Vitamin K.  Ongoing bleeding and hypotension, PCCM consulted.  Given FFP.  IR consulted & patient underwent embolization for bleeding in distal colon.   Past Medical History:  Rheumatic Mitral Valve Disease - s/p MVR  NICM s/p ICD  Sick Sinus Syndrome  Chronic Systolic CHF- EF 01-77% (04/3902) Cardiac Arrest - VF 2009  AF - on coumadin  DVT - iliofemoral embolus s/p R femoral embolectomy  Chronic Hypotension - on midodrine  CVA Diverticular Disease  Colon Cancer - 1999, s/p chemotherapy Internal Hemorrhoids  Arthritis  Dementia  Significant Hospital Events:  2/17 Admit with weakness, left chest pain and GIB. To IR for embolization.  2/18 Hgb 7.4, one bloody BM overnight   Consults:  GI- [place]   Procedures:   Significant Diagnostic Tests:   CTA Abd / pelvis 2/17 >> positive for GIB involving the descending colon, presumably diverticular disease  Limited ECHO 2/17 >> LVEF <20%, LV with severely decreased function with global hypokinesis & severely dilated, LA severely dilated, RA severely dilated, mechanical mitral valve (similar ECHO to prior)  Micro Data:  COVID 2/17 >> neg Flu 2/17 >> neg  Antimicrobials:     Interim History / Subjective:  Afebrile  One bloody BM overnight  Hgb 7.4  Remains on 43mcg neosynephrine, SBP's in 90-100's On RA  Glucose well controlled / WNL I/O +3.8L, no stool documented but reported x1 overnight   Objective   Blood pressure (!) 106/44, pulse 73, temperature 98.1 F (36.7 C), temperature source Oral, resp. rate (!) 9, height 5\' 6"  (1.676 m), weight  60.7 kg, SpO2 99 %.        Intake/Output Summary (Last 24 hours) at 10/13/2020 0930 Last data filed at 10/13/2020 0600 Gross per 24 hour  Intake 2735.76 ml  Output 375 ml  Net 2360.76 ml   Filed Weights   10/12/20 0241 10/13/20 0440  Weight: 60.8 kg 60.7 kg   Examination: General: elderly adult female lying in bed in NAD on RA HEENT: MM pink/moist, anicteric Neuro: Awake, alert, oriented to self, place, city, not date/time, MAE CV: s1s2 v-paced 70's with frequent PVC's, no m/r/g, valvular click noted  PULM: non-labored on RA, lungs bilaterally clear GI: soft, bsx4 active  Extremities: warm/dry, no edema  Skin: no rashes or lesions  PCXR 2/18 >> images personally reviewed, no acute process, cardiomegaly, MVR noted, implanted cardiac device x2  Resolved Hospital Problem list     Assessment & Plan:   Lower GIB, Diverticular on CT   Hx - remote h/o colon cancer, T3N1, s/p R hemicolectomy and chemo Hx mod to severe diverticulosis in 2010, hx of hematochezia in 2018 S/p embolization 2/17 per IR.  -appreciate IR / GI assistance in plan of care  -monitor for further bleeding  -resume clear liquid diet and advance as tolerated, no red foods / liquids -stop PPI gtt  -protonix 40 mg PO BID  ABLA Supratherapeutic INR / Coagulapathy on Coumadin with Mechanical Mitral Valve Thrombocytopenia  In setting of anticoagulation for mechanical valve, diverticular GIB.  Suspect thrombocytopenia reactive / consumptive -trend CBC, follow up at noon -transfuse 1  unit PRBC -consider restarting heparin gtt in am -transfuse for Hgb <8% given bleeding, cardiac hx and chest pain on admit  -cautious fluid resuscitation with baseline EF 15-20%.  CXR remains clear.    Acute on Chronic Hypotension exacerbated by ABLA / Hemorrhagic Shock  -continue home midodrine at increased dose  -wean neosynephrine for MAP >65  -follow strict I/O's  -tele / ICU monitoring  Afib HFrEF/ NICM EF 15-20% w/  ICD Rheumatic Heart Disease s/p MVR on coumadin, goal INR 3-3.5 Hx of iliac artery embolus/ DVT -continue home amiodarone, digoxin  -hold home coreg -ECHO reviewed, unchanged EF from prior   Mild AKI and NGMA - resolved Hypomagnesemia  -Trend BMP / urinary output -Replace electrolytes as indicated, 1gm Mg+ -Avoid nephrotoxic agents, ensure adequate renal perfusion  N/V  On admit, none since -follow up CXR w/o infiltrate or evidence of aspiration   Hx Dementia  Hx chronic pain  Anxiety with chronic benzo use -continue home xanax PRN -hold home oxycodone, zanaflex  -PRN low dose morphine  -delirium precautions   Hyperglycemia -follow CBG -goal glucose 140-180  Best practice (evaluated daily)  Diet: NPO  Pain/Anxiety/Delirium protocol (if indicated): n/a VAP protocol (if indicated): n/a DVT prophylaxis: SCDs GI prophylaxis: PPI   Glucose control: CBGs Mobility: BR Disposition: ICU  Goals of Care:  Last date of multidisciplinary goals of care discussion: Family and staff present:  Summary of discussion:  Follow up goals of care discussion due:  Code Status: Full.    Son updated at bedside 2/18 on plan of care.   Labs   CBC: Recent Labs  Lab 10/12/20 0303 10/12/20 0606 10/12/20 1434 10/12/20 1800 10/13/20 0336 10/13/20 0436  WBC 11.3* 11.7* 18.0*  --  13.4* 13.5*  HGB 12.9 9.9* 12.7 6.5* 7.8* 7.4*  HCT 42.4 33.2* 38.3 19.8* 24.3* 23.1*  MCV 97.5 100.0 92.3  --  93.8 93.9  PLT 187 144* 107*  --  67* 67*    Basic Metabolic Panel: Recent Labs  Lab 10/12/20 0303 10/13/20 0436  NA 138 139  K 4.7 4.1  CL 106 111  CO2 21* 21*  GLUCOSE 215* 104*  BUN 31* 23  CREATININE 1.39* 0.89  CALCIUM 9.2 7.2*  MG  --  1.6*  PHOS  --  2.9   GFR: Estimated Creatinine Clearance: 50.3 mL/min (by C-G formula based on SCr of 0.89 mg/dL). Recent Labs  Lab 10/12/20 0606 10/12/20 1434 10/13/20 0336 10/13/20 0436  WBC 11.7* 18.0* 13.4* 13.5*    Liver Function  Tests: Recent Labs  Lab 10/12/20 0303  AST 23  ALT 15  ALKPHOS 47  BILITOT 0.7  PROT 5.9*  ALBUMIN 3.4*   No results for input(s): LIPASE, AMYLASE in the last 168 hours. No results for input(s): AMMONIA in the last 168 hours.  ABG    Component Value Date/Time   PHART 7.511 (H) 01/30/2008 0355   PCO2ART 40.2 01/30/2008 0355   PO2ART 87.5 01/30/2008 0355   HCO3 31.7 (H) 01/30/2008 0355   TCO2 21 07/07/2012 0514   ACIDBASEDEF 0.3 01/29/2008 1005   O2SAT 97.2 01/30/2008 0355     Coagulation Profile: Recent Labs  Lab 10/12/20 0304 10/12/20 1434 10/12/20 1800 10/13/20 0436  INR 4.9* 1.9* 1.9* 1.6*    Cardiac Enzymes: No results for input(s): CKTOTAL, CKMB, CKMBINDEX, TROPONINI in the last 168 hours.  HbA1C: Hgb A1c MFr Bld  Date/Time Value Ref Range Status  11/28/2018 01:52 AM 6.0 (H) 4.8 - 5.6 % Final  Comment:    (NOTE) Pre diabetes:          5.7%-6.4% Diabetes:              >6.4% Glycemic control for   <7.0% adults with diabetes     CBG: Recent Labs  Lab 10/12/20 1947 10/13/20 0055 10/13/20 0439 10/13/20 0753  GLUCAP 101* 98 92 92     Critical care time:  52 minutes      Noe Gens, MSN, APRN, NP-C, AGACNP-BC [facility] 10/13/2020, 9:30 AM   Please see [email or web address] for pager details.   From 7A-7P if no response, please call [phone] After hours, please call ELink [phone]

## 2020-10-13 NOTE — Progress Notes (Signed)
Chief Complaint: Patient was seen today for follow up GI bleed angio/embolization  Supervising Physician: Mir, Biochemist, clinical  Patient Status: [facility] - In-pt  Subjective: S/p angiogram with embolization to small distal branch of left colic vessel felt to be source for bleeding. After successful embo, no further extrav noted during procedure. Reports only 1 small blood BM overnight, which may be residual. Pt feels pretty good. Denies abd pain.  Objective: Physical Exam: BP (!) 105/52   Pulse 73   Temp 98.1 F (36.7 C) (Oral)   Resp 16   Ht 5\' 6"  (1.676 m)   Wt 60.7 kg   SpO2 100%   BMI 21.60 kg/m  (R)IJ CVC intact, site clean, no bleeding or hematoma Abd: soft, NT (R)CFA access site soft, NT, no hematoma. Foot warm   Current Facility-Administered Medications:  .  0.9 %  sodium chloride infusion (Manually program via Guardrails IV Fluids), , Intravenous, Once, Ollis, Brandi L, NP .  [COMPLETED] sodium chloride 0.9 % bolus 1,000 mL, 1,000 mL, Intravenous, Once, Stopped at 10/12/20 0502 **AND** 0.9 %  sodium chloride infusion, , Intravenous, Continuous, Ollis, Brandi L, NP, Stopped at 10/13/20 0110 .  acetaminophen (TYLENOL) tablet 650 mg, 650 mg, Oral, Q6H PRN **OR** acetaminophen (TYLENOL) suppository 650 mg, 650 mg, Rectal, Q6H PRN, Karmen Bongo, MD .  ALPRAZolam Duanne Moron) tablet 0.25 mg, 0.25 mg, Oral, QHS PRN, Karmen Bongo, MD .  amiodarone (PACERONE) tablet 200 mg, 200 mg, Oral, Daily, Karmen Bongo, MD, 200 mg at 10/12/20 1200 .  Chlorhexidine Gluconate Cloth 2 % PADS 6 each, 6 each, Topical, Daily, Byrum, Rose Fillers, MD .  Chlorhexidine Gluconate Cloth 2 % PADS 6 each, 6 each, Topical, Daily, Collene Gobble, MD, 6 each at 10/12/20 1100 .  digoxin (LANOXIN) tablet 125 mcg, 125 mcg, Oral, Daily, Karmen Bongo, MD, 125 mcg at 10/12/20 1200 .  iohexol (OMNIPAQUE) 300 MG/ML solution 100 mL, 100 mL, Per Tube, Once PRN, Mir, Sharen Heck R, MD .  iohexol (OMNIPAQUE) 300 MG/ML  solution 150 mL, 150 mL, Per Tube, Once PRN, Mir, Paula Libra, MD .  midodrine (PROAMATINE) tablet 10 mg, 10 mg, Oral, TID WC, Ollis, Brandi L, NP, 10 mg at 10/13/20 0800 .  morphine 2 MG/ML injection 1 mg, 1 mg, Intravenous, Q2H PRN, Ollis, Brandi L, NP .  ondansetron (ZOFRAN) tablet 4 mg, 4 mg, Oral, Q6H PRN **OR** ondansetron (ZOFRAN) injection 4 mg, 4 mg, Intravenous, Q6H PRN, Karmen Bongo, MD .  pantoprazole (PROTONIX) 80 mg in sodium chloride 0.9 % 100 mL (0.8 mg/mL) infusion, 8 mg/hr, Intravenous, Continuous, Karmen Bongo, MD, Last Rate: 10 mL/hr at 10/13/20 0600, 8 mg/hr at 10/13/20 0600 .  phenylephrine (NEOSYNEPHRINE) 10-0.9 MG/250ML-% infusion, 25-200 mcg/min, Intravenous, Titrated, Ollis, Brandi L, NP, Last Rate: 37.5 mL/hr at 10/13/20 0815, 25 mcg/min at 10/13/20 0815 .  polyethylene glycol (MIRALAX / GLYCOLAX) packet 17 g, 17 g, Oral, Daily PRN, Ollis, Brandi L, NP .  sodium chloride flush (NS) 0.9 % injection 10-40 mL, 10-40 mL, Intracatheter, Q12H, Icard, Bradley L, DO .  sodium chloride flush (NS) 0.9 % injection 10-40 mL, 10-40 mL, Intracatheter, PRN, Icard, Bradley L, DO .  sodium chloride flush (NS) 0.9 % injection 3 mL, 3 mL, Intravenous, Q12H, Karmen Bongo, MD, 3 mL at 10/12/20 2200  Labs: CBC Recent Labs    10/13/20 0336 10/13/20 0436  WBC 13.4* 13.5*  HGB 7.8* 7.4*  HCT 24.3* 23.1*  PLT 67* 67*   BMET Recent Labs  10/12/20 0303 10/13/20 0436  NA 138 139  K 4.7 4.1  CL 106 111  CO2 21* 21*  GLUCOSE 215* 104*  BUN 31* 23  CREATININE 1.39* 0.89  CALCIUM 9.2 7.2*   LFT Recent Labs    10/12/20 0303  PROT 5.9*  ALBUMIN 3.4*  AST 23  ALT 15  ALKPHOS 47  BILITOT 0.7   PT/INR Recent Labs    10/12/20 1800 10/13/20 0436  LABPROT 20.7* 18.8*  INR 1.9* 1.6*     Studies/Results: IR Angiogram Visceral Selective  Result Date: 10/12/2020 INDICATION: 75 year old woman with acute GI bleed in hypertension requiring pressors presents to  interventional radiology for angiogram and possible embolization. CT angiography of the abdomen and pelvis showed acute hemorrhage in the descending colon in a background of diverticulosis. Patient's INR was elevated at 4.9 on presentation. Patient is on Coumadin for mechanical mitral valve. INR improved to 1.9 after administration of 2 units of FFP. Given patient's tenuous condition decision was made to perform angiogram and possible embolization. Central venous catheter was placed in anticipation of blood draws, blood product administration, and fluid resuscitation. EXAM: 1. Ultrasound-guided access of left common femoral artery. 2. Superior mesenteric angiogram and selective angiogram of proximal jejunal branches 3. Inferior mesenteric angiogram and selective angiogram and embolization of left colic branches 4. Angio-Seal closure of left common femoral artery access 5. Dual lumen central venous catheter placement MEDICATIONS: None ANESTHESIA/SEDATION: Moderate (conscious) sedation was employed during this procedure. A total of Versed 1 mg and Fentanyl 25 mcg was administered intravenously. Moderate Sedation Time: 2 hours and 9 minutes. The patient's level of consciousness and vital signs were monitored continuously by radiology nursing throughout the procedure under my direct supervision. FLUOROSCOPY TIME:  Fluoroscopy Time: 37 minutes 36 seconds (325 mGy). COMPLICATIONS: None immediate. PROCEDURE: Informed consent was obtained from the patient following explanation of the procedure, risks, benefits and alternatives. The patient understands, agrees and consents for the procedure. All questions were addressed. A time out was performed prior to the initiation of the procedure. Maximal barrier sterile technique utilized including caps, mask, sterile gowns, sterile gloves, large sterile drape, hand hygiene, and chlorhexidine prep. Patient positioned supine on the angiography table. Right neck prepped and draped in  the usual sterile fashion. All elements of maximal sterile barrier were utilized including, cap, mask, sterile gown, sterile gloves, large sterile drape, hand scrubbing and 2% Chlorhexidine for skin cleaning. The right internal jugular vein was evaluated with ultrasound and shown to be patent. A permanent ultrasound image was obtained and placed in the patient's medical record. Using sterile gel and a sterile probe cover, the right internal jugular vein was entered with a 21 ga needle during real time ultrasound guidance. 0.018 inch guidewire placed and 21 ga needle exchanged for peel-away sheath Dual lumen central venous catheter was inserted over guidewire. The tip was positioned at the cavoatrial junction. All lumens aspirated and flushed well. The catheter was secured to the skin with suture. The insertion site was was covered with bio patch and sterile dressing. Left groin skin was prepped and draped in the usual sterile fashion. All elements of maximal sterile barrier were utilized including, cap, mask, sterile gown, sterile gloves, large sterile drape, hand scrubbing and 2% Chlorhexidine for skin cleaning. The left common femoral artery was evaluated with ultrasound and shown to be patent. A permanent ultrasound image was obtained and placed in the patient's medical record. Using sterile gel and a sterile probe cover, the left common femoral  artery was entered with a 21 ga needle during real time ultrasound guidance. 21 gauge needle removed over 0.018 inch guidewire and replaced with a transitional dilator set. Five [place] dilator exchanged for 5 French sheath over 0.035 inch guidewire. The S1 catheter advanced to the superior mesenteric artery and angiogram was performed. No active extravasation was identified. Utilizing Progreat microcatheter, proximal jejunal branch was selected and angiogram was performed. No flow to the colon was identified. No active extravasation was seen. The inferior mesenteric  artery was selected with a Mickelson catheter. Angiogram showed active extravasation in the descending colon. Progreat microcatheter was advanced into the left colic branch and selective angiogram again showed contribution from small branches beyond the marginal artery of [place]. The more superior branch was successfully selected and embolized with a single 2 mm x 2 cm soft interlock coil. Post angiogram demonstrated no significant flow in the embolized artery and no extravasation. Numerous efforts were made to access the more inferior branch extending to the region of extravasation, however none were successful despite utilizing several different wires and micro catheters. Given that no additional extravasation was identified, decision was made to terminate the procedure without embolizing this more inferior branch. Groin sheath angiogram demonstrated appropriate access of the common femoral artery at the level of the femoral head. The groin was re-prepped and draped and closed with a 6 [place] Angio-Seal device. 2 minutes of additional compression applied to achieve hemostasis. IMPRESSION: Inferior mesenteric arteriogram demonstrates active hemorrhage in the descending colon. Two distal branches beyond the marginal artery of [place] appeared to be supplying the area extravasation. The superior of these 2 branches was selected and embolized with a micro coil. Despite prolonged efforts, the more inferior branch could not be selected for embolization. No active extravasation identified at the end of the procedure. Electronically Signed   By: Miachel Roux M.D.   On: 10/12/2020 20:45   IR Angiogram Visceral Selective  Result Date: 10/12/2020 INDICATION: 75 year old woman with acute GI bleed in hypertension requiring pressors presents to interventional radiology for angiogram and possible embolization. CT angiography of the abdomen and pelvis showed acute hemorrhage in the descending colon in a background of  diverticulosis. Patient's INR was elevated at 4.9 on presentation. Patient is on Coumadin for mechanical mitral valve. INR improved to 1.9 after administration of 2 units of FFP. Given patient's tenuous condition decision was made to perform angiogram and possible embolization. Central venous catheter was placed in anticipation of blood draws, blood product administration, and fluid resuscitation. EXAM: 1. Ultrasound-guided access of left common femoral artery. 2. Superior mesenteric angiogram and selective angiogram of proximal jejunal branches 3. Inferior mesenteric angiogram and selective angiogram and embolization of left colic branches 4. Angio-Seal closure of left common femoral artery access 5. Dual lumen central venous catheter placement MEDICATIONS: None ANESTHESIA/SEDATION: Moderate (conscious) sedation was employed during this procedure. A total of Versed 1 mg and Fentanyl 25 mcg was administered intravenously. Moderate Sedation Time: 2 hours and 9 minutes. The patient's level of consciousness and vital signs were monitored continuously by radiology nursing throughout the procedure under my direct supervision. FLUOROSCOPY TIME:  Fluoroscopy Time: 37 minutes 36 seconds (325 mGy). COMPLICATIONS: None immediate. PROCEDURE: Informed consent was obtained from the patient following explanation of the procedure, risks, benefits and alternatives. The patient understands, agrees and consents for the procedure. All questions were addressed. A time out was performed prior to the initiation of the procedure. Maximal barrier sterile technique utilized including caps, mask, sterile gowns, sterile  gloves, large sterile drape, hand hygiene, and chlorhexidine prep. Patient positioned supine on the angiography table. Right neck prepped and draped in the usual sterile fashion. All elements of maximal sterile barrier were utilized including, cap, mask, sterile gown, sterile gloves, large sterile drape, hand scrubbing and 2%  Chlorhexidine for skin cleaning. The right internal jugular vein was evaluated with ultrasound and shown to be patent. A permanent ultrasound image was obtained and placed in the patient's medical record. Using sterile gel and a sterile probe cover, the right internal jugular vein was entered with a 21 ga needle during real time ultrasound guidance. 0.018 inch guidewire placed and 21 ga needle exchanged for peel-away sheath Dual lumen central venous catheter was inserted over guidewire. The tip was positioned at the cavoatrial junction. All lumens aspirated and flushed well. The catheter was secured to the skin with suture. The insertion site was was covered with bio patch and sterile dressing. Left groin skin was prepped and draped in the usual sterile fashion. All elements of maximal sterile barrier were utilized including, cap, mask, sterile gown, sterile gloves, large sterile drape, hand scrubbing and 2% Chlorhexidine for skin cleaning. The left common femoral artery was evaluated with ultrasound and shown to be patent. A permanent ultrasound image was obtained and placed in the patient's medical record. Using sterile gel and a sterile probe cover, the left common femoral artery was entered with a 21 ga needle during real time ultrasound guidance. 21 gauge needle removed over 0.018 inch guidewire and replaced with a transitional dilator set. Five [place] dilator exchanged for 5 French sheath over 0.035 inch guidewire. The S1 catheter advanced to the superior mesenteric artery and angiogram was performed. No active extravasation was identified. Utilizing Progreat microcatheter, proximal jejunal branch was selected and angiogram was performed. No flow to the colon was identified. No active extravasation was seen. The inferior mesenteric artery was selected with a Mickelson catheter. Angiogram showed active extravasation in the descending colon. Progreat microcatheter was advanced into the left colic branch and  selective angiogram again showed contribution from small branches beyond the marginal artery of [place]. The more superior branch was successfully selected and embolized with a single 2 mm x 2 cm soft interlock coil. Post angiogram demonstrated no significant flow in the embolized artery and no extravasation. Numerous efforts were made to access the more inferior branch extending to the region of extravasation, however none were successful despite utilizing several different wires and micro catheters. Given that no additional extravasation was identified, decision was made to terminate the procedure without embolizing this more inferior branch. Groin sheath angiogram demonstrated appropriate access of the common femoral artery at the level of the femoral head. The groin was re-prepped and draped and closed with a 6 [place] Angio-Seal device. 2 minutes of additional compression applied to achieve hemostasis. IMPRESSION: Inferior mesenteric arteriogram demonstrates active hemorrhage in the descending colon. Two distal branches beyond the marginal artery of [place] appeared to be supplying the area extravasation. The superior of these 2 branches was selected and embolized with a micro coil. Despite prolonged efforts, the more inferior branch could not be selected for embolization. No active extravasation identified at the end of the procedure. Electronically Signed   By: Miachel Roux M.D.   On: 10/12/2020 20:45   IR Fluoro Guide CV Line Right  Result Date: 10/12/2020 INDICATION: 75 year old woman with acute GI bleed in hypertension requiring pressors presents to interventional radiology for angiogram and possible embolization. CT angiography of the abdomen  and pelvis showed acute hemorrhage in the descending colon in a background of diverticulosis. Patient's INR was elevated at 4.9 on presentation. Patient is on Coumadin for mechanical mitral valve. INR improved to 1.9 after administration of 2 units of FFP. Given  patient's tenuous condition decision was made to perform angiogram and possible embolization. Central venous catheter was placed in anticipation of blood draws, blood product administration, and fluid resuscitation. EXAM: 1. Ultrasound-guided access of left common femoral artery. 2. Superior mesenteric angiogram and selective angiogram of proximal jejunal branches 3. Inferior mesenteric angiogram and selective angiogram and embolization of left colic branches 4. Angio-Seal closure of left common femoral artery access 5. Dual lumen central venous catheter placement MEDICATIONS: None ANESTHESIA/SEDATION: Moderate (conscious) sedation was employed during this procedure. A total of Versed 1 mg and Fentanyl 25 mcg was administered intravenously. Moderate Sedation Time: 2 hours and 9 minutes. The patient's level of consciousness and vital signs were monitored continuously by radiology nursing throughout the procedure under my direct supervision. FLUOROSCOPY TIME:  Fluoroscopy Time: 37 minutes 36 seconds (325 mGy). COMPLICATIONS: None immediate. PROCEDURE: Informed consent was obtained from the patient following explanation of the procedure, risks, benefits and alternatives. The patient understands, agrees and consents for the procedure. All questions were addressed. A time out was performed prior to the initiation of the procedure. Maximal barrier sterile technique utilized including caps, mask, sterile gowns, sterile gloves, large sterile drape, hand hygiene, and chlorhexidine prep. Patient positioned supine on the angiography table. Right neck prepped and draped in the usual sterile fashion. All elements of maximal sterile barrier were utilized including, cap, mask, sterile gown, sterile gloves, large sterile drape, hand scrubbing and 2% Chlorhexidine for skin cleaning. The right internal jugular vein was evaluated with ultrasound and shown to be patent. A permanent ultrasound image was obtained and placed in the  patient's medical record. Using sterile gel and a sterile probe cover, the right internal jugular vein was entered with a 21 ga needle during real time ultrasound guidance. 0.018 inch guidewire placed and 21 ga needle exchanged for peel-away sheath Dual lumen central venous catheter was inserted over guidewire. The tip was positioned at the cavoatrial junction. All lumens aspirated and flushed well. The catheter was secured to the skin with suture. The insertion site was was covered with bio patch and sterile dressing. Left groin skin was prepped and draped in the usual sterile fashion. All elements of maximal sterile barrier were utilized including, cap, mask, sterile gown, sterile gloves, large sterile drape, hand scrubbing and 2% Chlorhexidine for skin cleaning. The left common femoral artery was evaluated with ultrasound and shown to be patent. A permanent ultrasound image was obtained and placed in the patient's medical record. Using sterile gel and a sterile probe cover, the left common femoral artery was entered with a 21 ga needle during real time ultrasound guidance. 21 gauge needle removed over 0.018 inch guidewire and replaced with a transitional dilator set. Five [place] dilator exchanged for 5 French sheath over 0.035 inch guidewire. The S1 catheter advanced to the superior mesenteric artery and angiogram was performed. No active extravasation was identified. Utilizing Progreat microcatheter, proximal jejunal branch was selected and angiogram was performed. No flow to the colon was identified. No active extravasation was seen. The inferior mesenteric artery was selected with a Mickelson catheter. Angiogram showed active extravasation in the descending colon. Progreat microcatheter was advanced into the left colic branch and selective angiogram again showed contribution from small branches beyond the marginal artery  of [place]. The more superior branch was successfully selected and embolized with a  single 2 mm x 2 cm soft interlock coil. Post angiogram demonstrated no significant flow in the embolized artery and no extravasation. Numerous efforts were made to access the more inferior branch extending to the region of extravasation, however none were successful despite utilizing several different wires and micro catheters. Given that no additional extravasation was identified, decision was made to terminate the procedure without embolizing this more inferior branch. Groin sheath angiogram demonstrated appropriate access of the common femoral artery at the level of the femoral head. The groin was re-prepped and draped and closed with a 6 [place] Angio-Seal device. 2 minutes of additional compression applied to achieve hemostasis. IMPRESSION: Inferior mesenteric arteriogram demonstrates active hemorrhage in the descending colon. Two distal branches beyond the marginal artery of [place] appeared to be supplying the area extravasation. The superior of these 2 branches was selected and embolized with a micro coil. Despite prolonged efforts, the more inferior branch could not be selected for embolization. No active extravasation identified at the end of the procedure. Electronically Signed   By: Miachel Roux M.D.   On: 10/12/2020 20:45   IR US Guide Vasc Access Left  Result Date: 10/12/2020 INDICATION: 75 year old woman with acute GI bleed in hypertension requiring pressors presents to interventional radiology for angiogram and possible embolization. CT angiography of the abdomen and pelvis showed acute hemorrhage in the descending colon in a background of diverticulosis. Patient's INR was elevated at 4.9 on presentation. Patient is on Coumadin for mechanical mitral valve. INR improved to 1.9 after administration of 2 units of FFP. Given patient's tenuous condition decision was made to perform angiogram and possible embolization. Central venous catheter was placed in anticipation of blood draws, blood product  administration, and fluid resuscitation. EXAM: 1. Ultrasound-guided access of left common femoral artery. 2. Superior mesenteric angiogram and selective angiogram of proximal jejunal branches 3. Inferior mesenteric angiogram and selective angiogram and embolization of left colic branches 4. Angio-Seal closure of left common femoral artery access 5. Dual lumen central venous catheter placement MEDICATIONS: None ANESTHESIA/SEDATION: Moderate (conscious) sedation was employed during this procedure. A total of Versed 1 mg and Fentanyl 25 mcg was administered intravenously. Moderate Sedation Time: 2 hours and 9 minutes. The patient's level of consciousness and vital signs were monitored continuously by radiology nursing throughout the procedure under my direct supervision. FLUOROSCOPY TIME:  Fluoroscopy Time: 37 minutes 36 seconds (325 mGy). COMPLICATIONS: None immediate. PROCEDURE: Informed consent was obtained from the patient following explanation of the procedure, risks, benefits and alternatives. The patient understands, agrees and consents for the procedure. All questions were addressed. A time out was performed prior to the initiation of the procedure. Maximal barrier sterile technique utilized including caps, mask, sterile gowns, sterile gloves, large sterile drape, hand hygiene, and chlorhexidine prep. Patient positioned supine on the angiography table. Right neck prepped and draped in the usual sterile fashion. All elements of maximal sterile barrier were utilized including, cap, mask, sterile gown, sterile gloves, large sterile drape, hand scrubbing and 2% Chlorhexidine for skin cleaning. The right internal jugular vein was evaluated with ultrasound and shown to be patent. A permanent ultrasound image was obtained and placed in the patient's medical record. Using sterile gel and a sterile probe cover, the right internal jugular vein was entered with a 21 ga needle during real time ultrasound guidance. 0.018  inch guidewire placed and 21 ga needle exchanged for peel-away sheath Dual lumen central  venous catheter was inserted over guidewire. The tip was positioned at the cavoatrial junction. All lumens aspirated and flushed well. The catheter was secured to the skin with suture. The insertion site was was covered with bio patch and sterile dressing. Left groin skin was prepped and draped in the usual sterile fashion. All elements of maximal sterile barrier were utilized including, cap, mask, sterile gown, sterile gloves, large sterile drape, hand scrubbing and 2% Chlorhexidine for skin cleaning. The left common femoral artery was evaluated with ultrasound and shown to be patent. A permanent ultrasound image was obtained and placed in the patient's medical record. Using sterile gel and a sterile probe cover, the left common femoral artery was entered with a 21 ga needle during real time ultrasound guidance. 21 gauge needle removed over 0.018 inch guidewire and replaced with a transitional dilator set. Five [place] dilator exchanged for 5 French sheath over 0.035 inch guidewire. The S1 catheter advanced to the superior mesenteric artery and angiogram was performed. No active extravasation was identified. Utilizing Progreat microcatheter, proximal jejunal branch was selected and angiogram was performed. No flow to the colon was identified. No active extravasation was seen. The inferior mesenteric artery was selected with a Mickelson catheter. Angiogram showed active extravasation in the descending colon. Progreat microcatheter was advanced into the left colic branch and selective angiogram again showed contribution from small branches beyond the marginal artery of [place]. The more superior branch was successfully selected and embolized with a single 2 mm x 2 cm soft interlock coil. Post angiogram demonstrated no significant flow in the embolized artery and no extravasation. Numerous efforts were made to access the more  inferior branch extending to the region of extravasation, however none were successful despite utilizing several different wires and micro catheters. Given that no additional extravasation was identified, decision was made to terminate the procedure without embolizing this more inferior branch. Groin sheath angiogram demonstrated appropriate access of the common femoral artery at the level of the femoral head. The groin was re-prepped and draped and closed with a 6 [place] Angio-Seal device. 2 minutes of additional compression applied to achieve hemostasis. IMPRESSION: Inferior mesenteric arteriogram demonstrates active hemorrhage in the descending colon. Two distal branches beyond the marginal artery of [place] appeared to be supplying the area extravasation. The superior of these 2 branches was selected and embolized with a micro coil. Despite prolonged efforts, the more inferior branch could not be selected for embolization. No active extravasation identified at the end of the procedure. Electronically Signed   By: Miachel Roux M.D.   On: 10/12/2020 20:45   IR US Guide Vasc Access Right  Result Date: 10/12/2020 INDICATION: 75 year old woman with acute GI bleed in hypertension requiring pressors presents to interventional radiology for angiogram and possible embolization. CT angiography of the abdomen and pelvis showed acute hemorrhage in the descending colon in a background of diverticulosis. Patient's INR was elevated at 4.9 on presentation. Patient is on Coumadin for mechanical mitral valve. INR improved to 1.9 after administration of 2 units of FFP. Given patient's tenuous condition decision was made to perform angiogram and possible embolization. Central venous catheter was placed in anticipation of blood draws, blood product administration, and fluid resuscitation. EXAM: 1. Ultrasound-guided access of left common femoral artery. 2. Superior mesenteric angiogram and selective angiogram of proximal jejunal  branches 3. Inferior mesenteric angiogram and selective angiogram and embolization of left colic branches 4. Angio-Seal closure of left common femoral artery access 5. Dual lumen central venous catheter placement  MEDICATIONS: None ANESTHESIA/SEDATION: Moderate (conscious) sedation was employed during this procedure. A total of Versed 1 mg and Fentanyl 25 mcg was administered intravenously. Moderate Sedation Time: 2 hours and 9 minutes. The patient's level of consciousness and vital signs were monitored continuously by radiology nursing throughout the procedure under my direct supervision. FLUOROSCOPY TIME:  Fluoroscopy Time: 37 minutes 36 seconds (325 mGy). COMPLICATIONS: None immediate. PROCEDURE: Informed consent was obtained from the patient following explanation of the procedure, risks, benefits and alternatives. The patient understands, agrees and consents for the procedure. All questions were addressed. A time out was performed prior to the initiation of the procedure. Maximal barrier sterile technique utilized including caps, mask, sterile gowns, sterile gloves, large sterile drape, hand hygiene, and chlorhexidine prep. Patient positioned supine on the angiography table. Right neck prepped and draped in the usual sterile fashion. All elements of maximal sterile barrier were utilized including, cap, mask, sterile gown, sterile gloves, large sterile drape, hand scrubbing and 2% Chlorhexidine for skin cleaning. The right internal jugular vein was evaluated with ultrasound and shown to be patent. A permanent ultrasound image was obtained and placed in the patient's medical record. Using sterile gel and a sterile probe cover, the right internal jugular vein was entered with a 21 ga needle during real time ultrasound guidance. 0.018 inch guidewire placed and 21 ga needle exchanged for peel-away sheath Dual lumen central venous catheter was inserted over guidewire. The tip was positioned at the cavoatrial junction.  All lumens aspirated and flushed well. The catheter was secured to the skin with suture. The insertion site was was covered with bio patch and sterile dressing. Left groin skin was prepped and draped in the usual sterile fashion. All elements of maximal sterile barrier were utilized including, cap, mask, sterile gown, sterile gloves, large sterile drape, hand scrubbing and 2% Chlorhexidine for skin cleaning. The left common femoral artery was evaluated with ultrasound and shown to be patent. A permanent ultrasound image was obtained and placed in the patient's medical record. Using sterile gel and a sterile probe cover, the left common femoral artery was entered with a 21 ga needle during real time ultrasound guidance. 21 gauge needle removed over 0.018 inch guidewire and replaced with a transitional dilator set. Five [place] dilator exchanged for 5 French sheath over 0.035 inch guidewire. The S1 catheter advanced to the superior mesenteric artery and angiogram was performed. No active extravasation was identified. Utilizing Progreat microcatheter, proximal jejunal branch was selected and angiogram was performed. No flow to the colon was identified. No active extravasation was seen. The inferior mesenteric artery was selected with a Mickelson catheter. Angiogram showed active extravasation in the descending colon. Progreat microcatheter was advanced into the left colic branch and selective angiogram again showed contribution from small branches beyond the marginal artery of [place]. The more superior branch was successfully selected and embolized with a single 2 mm x 2 cm soft interlock coil. Post angiogram demonstrated no significant flow in the embolized artery and no extravasation. Numerous efforts were made to access the more inferior branch extending to the region of extravasation, however none were successful despite utilizing several different wires and micro catheters. Given that no additional extravasation  was identified, decision was made to terminate the procedure without embolizing this more inferior branch. Groin sheath angiogram demonstrated appropriate access of the common femoral artery at the level of the femoral head. The groin was re-prepped and draped and closed with a 6 [place] Angio-Seal device. 2 minutes of additional  compression applied to achieve hemostasis. IMPRESSION: Inferior mesenteric arteriogram demonstrates active hemorrhage in the descending colon. Two distal branches beyond the marginal artery of [place] appeared to be supplying the area extravasation. The superior of these 2 branches was selected and embolized with a micro coil. Despite prolonged efforts, the more inferior branch could not be selected for embolization. No active extravasation identified at the end of the procedure. Electronically Signed   By: Miachel Roux M.D.   On: 10/12/2020 20:45   DG Chest Port 1 View  Result Date: 10/13/2020 CLINICAL DATA:  Vomiting EXAM: PORTABLE CHEST 1 VIEW COMPARISON:  Yesterday FINDINGS: Cardiopericardial enlargement. There is biventricular pacer leads and mitral valve replacement. New right IJ line with tip at the upper right atrium. There is no edema, consolidation, effusion, or pneumothorax. IMPRESSION: No evidence of active disease. Electronically Signed   By: Monte Fantasia M.D.   On: 10/13/2020 06:40   DG Chest Portable 1 View  Result Date: 10/12/2020 CLINICAL DATA:  Chest pain EXAM: PORTABLE CHEST 1 VIEW COMPARISON:  09/29/2019 FINDINGS: Marked chronic cardiomegaly. Biventricular pacer and mitral valve replacement. There is no edema, consolidation, effusion, or pneumothorax. IMPRESSION: Stable from 2021.  Cardiomegaly without failure. Electronically Signed   By: Monte Fantasia M.D.   On: 10/12/2020 06:20   IR EMBO ART  VEN HEMORR LYMPH EXTRAV  INC GUIDE ROADMAPPING  Result Date: 10/12/2020 INDICATION: 75 year old woman with acute GI bleed in hypertension requiring pressors  presents to interventional radiology for angiogram and possible embolization. CT angiography of the abdomen and pelvis showed acute hemorrhage in the descending colon in a background of diverticulosis. Patient's INR was elevated at 4.9 on presentation. Patient is on Coumadin for mechanical mitral valve. INR improved to 1.9 after administration of 2 units of FFP. Given patient's tenuous condition decision was made to perform angiogram and possible embolization. Central venous catheter was placed in anticipation of blood draws, blood product administration, and fluid resuscitation. EXAM: 1. Ultrasound-guided access of left common femoral artery. 2. Superior mesenteric angiogram and selective angiogram of proximal jejunal branches 3. Inferior mesenteric angiogram and selective angiogram and embolization of left colic branches 4. Angio-Seal closure of left common femoral artery access 5. Dual lumen central venous catheter placement MEDICATIONS: None ANESTHESIA/SEDATION: Moderate (conscious) sedation was employed during this procedure. A total of Versed 1 mg and Fentanyl 25 mcg was administered intravenously. Moderate Sedation Time: 2 hours and 9 minutes. The patient's level of consciousness and vital signs were monitored continuously by radiology nursing throughout the procedure under my direct supervision. FLUOROSCOPY TIME:  Fluoroscopy Time: 37 minutes 36 seconds (325 mGy). COMPLICATIONS: None immediate. PROCEDURE: Informed consent was obtained from the patient following explanation of the procedure, risks, benefits and alternatives. The patient understands, agrees and consents for the procedure. All questions were addressed. A time out was performed prior to the initiation of the procedure. Maximal barrier sterile technique utilized including caps, mask, sterile gowns, sterile gloves, large sterile drape, hand hygiene, and chlorhexidine prep. Patient positioned supine on the angiography table. Right neck prepped and  draped in the usual sterile fashion. All elements of maximal sterile barrier were utilized including, cap, mask, sterile gown, sterile gloves, large sterile drape, hand scrubbing and 2% Chlorhexidine for skin cleaning. The right internal jugular vein was evaluated with ultrasound and shown to be patent. A permanent ultrasound image was obtained and placed in the patient's medical record. Using sterile gel and a sterile probe cover, the right internal jugular vein was entered with  a 21 ga needle during real time ultrasound guidance. 0.018 inch guidewire placed and 21 ga needle exchanged for peel-away sheath Dual lumen central venous catheter was inserted over guidewire. The tip was positioned at the cavoatrial junction. All lumens aspirated and flushed well. The catheter was secured to the skin with suture. The insertion site was was covered with bio patch and sterile dressing. Left groin skin was prepped and draped in the usual sterile fashion. All elements of maximal sterile barrier were utilized including, cap, mask, sterile gown, sterile gloves, large sterile drape, hand scrubbing and 2% Chlorhexidine for skin cleaning. The left common femoral artery was evaluated with ultrasound and shown to be patent. A permanent ultrasound image was obtained and placed in the patient's medical record. Using sterile gel and a sterile probe cover, the left common femoral artery was entered with a 21 ga needle during real time ultrasound guidance. 21 gauge needle removed over 0.018 inch guidewire and replaced with a transitional dilator set. Five [place] dilator exchanged for 5 French sheath over 0.035 inch guidewire. The S1 catheter advanced to the superior mesenteric artery and angiogram was performed. No active extravasation was identified. Utilizing Progreat microcatheter, proximal jejunal branch was selected and angiogram was performed. No flow to the colon was identified. No active extravasation was seen. The inferior  mesenteric artery was selected with a Mickelson catheter. Angiogram showed active extravasation in the descending colon. Progreat microcatheter was advanced into the left colic branch and selective angiogram again showed contribution from small branches beyond the marginal artery of [place]. The more superior branch was successfully selected and embolized with a single 2 mm x 2 cm soft interlock coil. Post angiogram demonstrated no significant flow in the embolized artery and no extravasation. Numerous efforts were made to access the more inferior branch extending to the region of extravasation, however none were successful despite utilizing several different wires and micro catheters. Given that no additional extravasation was identified, decision was made to terminate the procedure without embolizing this more inferior branch. Groin sheath angiogram demonstrated appropriate access of the common femoral artery at the level of the femoral head. The groin was re-prepped and draped and closed with a 6 [place] Angio-Seal device. 2 minutes of additional compression applied to achieve hemostasis. IMPRESSION: Inferior mesenteric arteriogram demonstrates active hemorrhage in the descending colon. Two distal branches beyond the marginal artery of [place] appeared to be supplying the area extravasation. The superior of these 2 branches was selected and embolized with a micro coil. Despite prolonged efforts, the more inferior branch could not be selected for embolization. No active extravasation identified at the end of the procedure. Electronically Signed   By: Miachel Roux M.D.   On: 10/12/2020 20:45   ECHOCARDIOGRAM LIMITED  Result Date: 10/12/2020    ECHOCARDIOGRAM LIMITED REPORT   Patient Name:   MIN TUNNELL Date of Exam: 10/12/2020 Medical Rec #:  [id number]    Height:       66.0 in Accession #:    [id number]   Weight:       134.0 lb Date of Birth:  Jan 11, 1946    BSA:          1.687 m Patient Age:    75 years      BP:           80/58 mmHg Patient Gender: F            HR:           70 bpm. Exam  Location:  Inpatient Procedure: Limited Echo, Color Doppler and Cardiac Doppler Indications:    R07.9* Chest pain, unspecified  History:        Patient has prior history of Echocardiogram examinations, most                 recent 10/27/2018. Defibrillator; Arrythmias:Atrial Fibrillation.                 Mechanical Mitral Valve Replacement.                  Mitral Valve: mechanical valve valve is present in the mitral                 position.  Sonographer:    Raquel Sarna Senior RDCS Referring Phys: [id number] Donita Brooks  Sonographer Comments: Limited to recheck EF per notes. IMPRESSIONS  1. Left ventricular ejection fraction, by estimation, is <20%. The left ventricle has severely decreased function. The left ventricle demonstrates global hypokinesis. The left ventricular internal cavity size was severely dilated.  2. Left atrial size was severely dilated.  3. Right atrial size was severely dilated.  4. The mitral valve has been repaired/replaced. There is a mechanical valve present in the mitral position.  5. The aortic valve is tricuspid. Aortic valve regurgitation is trivial.  6. The inferior vena cava is normal in size with greater than 50% respiratory variability, suggesting right atrial pressure of 3 mmHg. Comparison(s): Prior images reviewed side by side. Conclusion(s)/Recommendation(s): Limited echo for EF, which remains severely reduced at <20%. Prior images reviewed and are similar. FINDINGS  Left Ventricle: Left ventricular ejection fraction, by estimation, is <20%. The left ventricle has severely decreased function. The left ventricle demonstrates global hypokinesis. The left ventricular internal cavity size was severely dilated. Left Atrium: Left atrial size was severely dilated. Right Atrium: Right atrial size was severely dilated. Mitral Valve: The mitral valve has been repaired/replaced. There is a mechanical valve present in  the mitral position. Tricuspid Valve: The tricuspid valve is grossly normal. Tricuspid valve regurgitation is trivial. Aortic Valve: The aortic valve is tricuspid. Aortic valve regurgitation is trivial. Venous: The inferior vena cava is normal in size with greater than 50% respiratory variability, suggesting right atrial pressure of 3 mmHg. Additional Comments: A pacer wire is visualized in the right atrium and right ventricle. RIGHT VENTRICLE RV S prime:     5.33 cm/s TAPSE (M-mode): 1.3 cm Buford Dresser MD Electronically signed by Buford Dresser MD Signature Date/Time: 10/12/2020/9:10:35 PM    Final    CT Angio Abd/Pel w/ and/or w/o  Result Date: 10/12/2020 CLINICAL DATA:  75 year old female with a history of melena EXAM: CTA ABDOMEN AND PELVIS WITHOUT AND WITH CONTRAST TECHNIQUE: Multidetector CT imaging of the abdomen and pelvis was performed using the standard protocol during bolus administration of intravenous contrast. Multiplanar reconstructed images and MIPs were obtained and reviewed to evaluate the vascular anatomy. CONTRAST:  35mL OMNIPAQUE IOHEXOL 350 MG/ML SOLN COMPARISON:  11/26/2018 FINDINGS: VASCULAR Aorta: Mild atherosclerotic changes of the abdominal aorta. No aneurysm or dissection. No periaortic fluid. Celiac: Celiac artery patent. Minimal atherosclerosis. The configuration and diameter of the celiac artery proximally suggests compression from the overlying diaphragmatic cruise. SMA: SMA patent with no significant atherosclerotic changes. Renals: - Right: Single right renal artery. No significant calcified plaque at the origin. Small caliber right renal artery. - Left: Single left renal artery. No significant atherosclerotic changes. IMA: IMA is patent. Right lower extremity: Unremarkable course, caliber, and contour of the  right iliac system. No aneurysm, dissection, or occlusion. Minimal atherosclerosis of the right iliac system. Hypogastric artery is patent. Common femoral  artery patent. Postsurgical changes adjacent to the right common femoral artery without focal fluid or evidence pseudoaneurysm. Proximal SFA and profunda femoris patent. Left lower extremity: Unremarkable course, caliber, and contour of the left iliac system. No aneurysm, dissection, or occlusion. Minimal atherosclerosis of the left iliac system. Hypogastric artery is patent. Common femoral artery patent. Proximal SFA and profunda femoris patent. Veins: Unremarkable appearance of the venous system. Review of the MIP images confirms the above findings. NON-VASCULAR Lower chest: Cardiomegaly, incompletely imaged. Cardiac pacing leads are present Hepatobiliary: Unremarkable appearance of the liver. Noncalcified cholelithiasis is evident. No focal inflammatory changes. Pancreas: Unremarkable. Spleen: Unremarkable. Adrenals/Urinary Tract: - Right adrenal gland: Unremarkable - Left adrenal gland: Unremarkable. - Right kidney: Enlargement of the right renal pelvis and collecting system, similar to the comparison CT. Nonobstructing right-sided nephrolithiasis. There appears to be ureteropelvic junction narrowing given the transition. Right renal cortical thinning. - Left Kidney: No left-sided hydronephrosis or nephrolithiasis. No inflammatory changes. Unremarkable course of the left ureter. Small low-density and nonenhancing lesions, most likely cyst though too small to characterize. - Urinary Bladder: Distension of the urinary bladder. Stomach/Bowel: - Stomach: Unremarkable. - Small bowel: Unremarkable - Appendix: Surgical changes in the cecum. Appendix is not visualized, however, no inflammatory changes are present adjacent to the cecum to indicate an appendicitis. - Colon: Surgical changes at the cecum. Diverticula present within transverse colon, descending colon, sigmoid colon. The splenic flexure descending colon, and the rectum are fluid-filled. There is evidence extravasation of contrast involving the descending  colon given the appearance of the density on the arterial phase and intensification on the delayed phase imaging. Lymphatic: No lymphadenopathy Mesenteric: No free fluid or air. No mesenteric adenopathy. Reproductive: Hysterectomy Other: No hernia. Musculoskeletal: No acute displaced fracture. Degenerative changes of the spine. Degenerative changes of the hips. No aggressive lytic lesion or sclerotic lesion identified. IMPRESSION: The CT angiogram is positive for GI bleeding involving the descending colon, presumably diverticular disease. These above results will be called to the ordering clinician or representative by the Radiologist Assistant, and communication documented in the PACS or [employer]. Aortic Atherosclerosis (ICD10-I70.0). 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 Associated nonobstructing nephrolithiasis. Additional ancillary findings as above. Signed, Dulcy Fanny. Dellia Nims, RPVI [facility] Radiology Electronically Signed   By: Corrie Mckusick D.O.   On: 10/12/2020 11:36    Assessment/Plan: S/p visceral angio with successful coil embo of left colic branch. Hgb has dropped but felt to be equilibrated....7.4 D/w PCCM team, feel okay to resume heparin gtt at this time for mechanical MVR. IR will follow along.    LOS: 1 day   I spent a total of 20 minutes in face to face in clinical consultation, greater than 50% of which was counseling/coordinating care for GI bleed angio/bleed  Ascencion Dike PA-C 10/13/2020 10:17 AM

## 2020-10-13 NOTE — Progress Notes (Addendum)
Daily Rounding Note  10/13/2020, 12:13 PM  LOS: 1 day   SUBJECTIVE:   Chief complaint: Diverticular bleed.  Status post embolization.  Dark, burgundy tinged stool last night.  None so far today.  Complaining of bilateral lower abdominal pain which was present yesterday as well.  She does not think it is any worse or better.  Tolerating clear liquids.  No nausea, no vomiting. With PRBC transfusing currently  OBJECTIVE:         Vital signs in last 24 hours:    Temp:  [96.6 F (35.9 C)-99.3 F (37.4 C)] 98.8 F (37.1 C) (02/18 1200) Pulse Rate:  [68-114] 73 (02/18 1200) Resp:  [0-23] 21 (02/18 1200) BP: (77-127)/(40-84) 96/43 (02/18 1200) SpO2:  [95 %-100 %] 98 % (02/18 1200) Weight:  [60.7 kg] 60.7 kg (02/18 0440) Last BM Date: 10/13/20 Filed Weights   10/12/20 0241 10/13/20 0440  Weight: 60.8 kg 60.7 kg   General: Frail, pale, alert, comfortable Heart: Valve click.  RRR, NSR at 73. Chest: Chest clear to auscultation in front.  No labored breathing.  No cough Abdomen: Soft.  Bowel sounds present.  Tenderness without guarding in lower abdomen bilaterally. Extremities: No CCE Neuro/Psych: Follows commands.  Responds to questions appropriately.  Intake/Output from previous day: 02/17 0701 - 02/18 0700 In: 4177.8 [I.V.:1511.3; Blood:2016.5; IV Piggyback:650] Out: 375 [Urine:375]  Intake/Output this shift: Total I/O In: 259.3 [I.V.:259.3] Out: -   Lab Results: Recent Labs    10/12/20 1434 10/12/20 1800 10/13/20 0336 10/13/20 0436  WBC 18.0*  --  13.4* 13.5*  HGB 12.7 6.5* 7.8* 7.4*  HCT 38.3 19.8* 24.3* 23.1*  PLT 107*  --  67* 67*   BMET Recent Labs    10/12/20 0303 10/13/20 0436  NA 138 139  K 4.7 4.1  CL 106 111  CO2 21* 21*  GLUCOSE 215* 104*  BUN 31* 23  CREATININE 1.39* 0.89  CALCIUM 9.2 7.2*   LFT Recent Labs    10/12/20 0303  PROT 5.9*  ALBUMIN 3.4*  AST 23  ALT 15  ALKPHOS 47   BILITOT 0.7   PT/INR Recent Labs    10/12/20 1800 10/13/20 0436  LABPROT 20.7* 18.8*  INR 1.9* 1.6*   Hepatitis Panel No results for input(s): HEPBSAG, HCVAB, HEPAIGM, HEPBIGM in the last 72 hours.  Studies/Results:  IR Angiogram Visceral Selective IR US Guide Vasc Access Left IR US Guide Vasc Access Right IR EMBO ART  VEN HEMORR LYMPH EXTRAV  INC GUIDE ROADMAPPING  Result Date: 10/12/2020 INDICATION: 75 year old woman with acute GI bleed in hypertension requiring pressors presents to interventional radiology for angiogram and possible embolization. CT angiography of the abdomen and pelvis showed acute hemorrhage in the descending colon in a background of diverticulosis. Patient's INR was elevated at 4.9 on presentation. Patient is on Coumadin for mechanical mitral valve. INR improved to 1.9 after administration of 2 units of FFP. Given patient's tenuous condition decision was made to perform angiogram and possible embolization. Central venous catheter was placed in anticipation of blood draws, blood product administration, and fluid resuscitation. EXAM: 1. Ultrasound-guided access of left common femoral artery. 2. Superior mesenteric angiogram and selective angiogram of proximal jejunal branches 3. Inferior mesenteric angiogram and selective angiogram and embolization of left colic branches 4. Angio-Seal closure of left common femoral artery access 5. Dual lumen central venous catheter placement MEDICATIONS: None ANESTHESIA/SEDATION: Moderate (conscious) sedation was employed during this procedure. A total of  Versed 1 mg and Fentanyl 25 mcg was administered intravenously. Moderate Sedation Time: 2 hours and 9 minutes. The patient's level of consciousness and vital signs were monitored continuously by radiology nursing throughout the procedure under my direct supervision. FLUOROSCOPY TIME:  Fluoroscopy Time: 37 minutes 36 seconds (325 mGy). COMPLICATIONS: None immediate. PROCEDURE: Informed  consent was obtained from the patient following explanation of the procedure, risks, benefits and alternatives. The patient understands, agrees and consents for the procedure. All questions were addressed. A time out was performed prior to the initiation of the procedure. Maximal barrier sterile technique utilized including caps, mask, sterile gowns, sterile gloves, large sterile drape, hand hygiene, and chlorhexidine prep. Patient positioned supine on the angiography table. Right neck prepped and draped in the usual sterile fashion. All elements of maximal sterile barrier were utilized including, cap, mask, sterile gown, sterile gloves, large sterile drape, hand scrubbing and 2% Chlorhexidine for skin cleaning. The right internal jugular vein was evaluated with ultrasound and shown to be patent. A permanent ultrasound image was obtained and placed in the patient's medical record. Using sterile gel and a sterile probe cover, the right internal jugular vein was entered with a 21 ga needle during real time ultrasound guidance. 0.018 inch guidewire placed and 21 ga needle exchanged for peel-away sheath Dual lumen central venous catheter was inserted over guidewire. The tip was positioned at the cavoatrial junction. All lumens aspirated and flushed well. The catheter was secured to the skin with suture. The insertion site was was covered with bio patch and sterile dressing. Left groin skin was prepped and draped in the usual sterile fashion. All elements of maximal sterile barrier were utilized including, cap, mask, sterile gown, sterile gloves, large sterile drape, hand scrubbing and 2% Chlorhexidine for skin cleaning. The left common femoral artery was evaluated with ultrasound and shown to be patent. A permanent ultrasound image was obtained and placed in the patient's medical record. Using sterile gel and a sterile probe cover, the left common femoral artery was entered with a 21 ga needle during real time  ultrasound guidance. 21 gauge needle removed over 0.018 inch guidewire and replaced with a transitional dilator set. Five [place] dilator exchanged for 5 French sheath over 0.035 inch guidewire. The S1 catheter advanced to the superior mesenteric artery and angiogram was performed. No active extravasation was identified. Utilizing Progreat microcatheter, proximal jejunal branch was selected and angiogram was performed. No flow to the colon was identified. No active extravasation was seen. The inferior mesenteric artery was selected with a Mickelson catheter. Angiogram showed active extravasation in the descending colon. Progreat microcatheter was advanced into the left colic branch and selective angiogram again showed contribution from small branches beyond the marginal artery of [place]. The more superior branch was successfully selected and embolized with a single 2 mm x 2 cm soft interlock coil. Post angiogram demonstrated no significant flow in the embolized artery and no extravasation. Numerous efforts were made to access the more inferior branch extending to the region of extravasation, however none were successful despite utilizing several different wires and micro catheters. Given that no additional extravasation was identified, decision was made to terminate the procedure without embolizing this more inferior branch. Groin sheath angiogram demonstrated appropriate access of the common femoral artery at the level of the femoral head. The groin was re-prepped and draped and closed with a 6 [place] Angio-Seal device. 2 minutes of additional compression applied to achieve hemostasis. IMPRESSION: Inferior mesenteric arteriogram demonstrates active hemorrhage in the  descending colon. Two distal branches beyond the marginal artery of [place] appeared to be supplying the area extravasation. The superior of these 2 branches was selected and embolized with a micro coil. Despite prolonged efforts, the more inferior  branch could not be selected for embolization. No active extravasation identified at the end of the procedure. Electronically Signed   By: Miachel Roux M.D.   On: 10/12/2020 20:45   DG Chest Port 1 View  Result Date: 10/13/2020 CLINICAL DATA:  Vomiting EXAM: PORTABLE CHEST 1 VIEW COMPARISON:  Yesterday FINDINGS: Cardiopericardial enlargement. There is biventricular pacer leads and mitral valve replacement. New right IJ line with tip at the upper right atrium. There is no edema, consolidation, effusion, or pneumothorax. IMPRESSION: No evidence of active disease. Electronically Signed   By: Monte Fantasia M.D.   On: 10/13/2020 06:40   DG Chest Portable 1 View  Result Date: 10/12/2020 CLINICAL DATA:  Chest pain EXAM: PORTABLE CHEST 1 VIEW COMPARISON:  09/29/2019 FINDINGS: Marked chronic cardiomegaly. Biventricular pacer and mitral valve replacement. There is no edema, consolidation, effusion, or pneumothorax. IMPRESSION: Stable from 2021.  Cardiomegaly without failure. Electronically Signed   By: Monte Fantasia M.D.   On: 10/12/2020 06:20    ECHOCARDIOGRAM LIMITED  Result Date: 10/12/2020 IMPRESSIONS  1. Left ventricular ejection fraction, by estimation, is <20%. The left ventricle has severely decreased function. The left ventricle demonstrates global hypokinesis. The left ventricular internal cavity size was severely dilated.  2. Left atrial size was severely dilated.  3. Right atrial size was severely dilated.  4. The mitral valve has been repaired/replaced. There is a mechanical valve present in the mitral position.  5. The aortic valve is tricuspid. Aortic valve regurgitation is trivial.  6. The inferior vena cava is normal in size with greater than 50% respiratory variability, suggesting right atrial pressure of 3 mmHg. Comparison(s): Prior images reviewed side by side. Conclusion(s)/Recommendation(s): Limited echo for EF, which remains severely reduced at <20%. Prior images reviewed and are  similar. Electronically signed by Buford Dresser MD Signature Date/Time: 10/12/2020/9:10:35 PM    Final    CT Angio Abd/Pel w/ and/or w/o  Result Date: 10/12/2020 CLINICAL DATA:  75 year old female with a history of melena EXAM: CTA ABDOMEN AND PELVIS WITHOUT AND WITH CONTRAST TECHNIQUE: Multidetector CT imaging of the abdomen and pelvis was performed using the standard protocol during bolus administration of intravenous contrast. Multiplanar reconstructed images and MIPs were obtained and reviewed to evaluate the vascular anatomy. CONTRAST:  45mL OMNIPAQUE IOHEXOL 350 MG/ML SOLN COMPARISON:  11/26/2018 FINDINGS: VASCULAR Aorta: Mild atherosclerotic changes of the abdominal aorta. No aneurysm or dissection. No periaortic fluid. Celiac: Celiac artery patent. Minimal atherosclerosis. The configuration and diameter of the celiac artery proximally suggests compression from the overlying diaphragmatic cruise. SMA: SMA patent with no significant atherosclerotic changes. Renals: - Right: Single right renal artery. No significant calcified plaque at the origin. Small caliber right renal artery. - Left: Single left renal artery. No significant atherosclerotic changes. IMA: IMA is patent. Right lower extremity: Unremarkable course, caliber, and contour of the right iliac system. No aneurysm, dissection, or occlusion. Minimal atherosclerosis of the right iliac system. Hypogastric artery is patent. Common femoral artery patent. Postsurgical changes adjacent to the right common femoral artery without focal fluid or evidence pseudoaneurysm. Proximal SFA and profunda femoris patent. Left lower extremity: Unremarkable course, caliber, and contour of the left iliac system. No aneurysm, dissection, or occlusion. Minimal atherosclerosis of the left iliac system. Hypogastric artery is patent. Common  femoral artery patent. Proximal SFA and profunda femoris patent. Veins: Unremarkable appearance of the venous system. Review of  the MIP images confirms the above findings. NON-VASCULAR Lower chest: Cardiomegaly, incompletely imaged. Cardiac pacing leads are present Hepatobiliary: Unremarkable appearance of the liver. Noncalcified cholelithiasis is evident. No focal inflammatory changes. Pancreas: Unremarkable. Spleen: Unremarkable. Adrenals/Urinary Tract: - Right adrenal gland: Unremarkable - Left adrenal gland: Unremarkable. - Right kidney: Enlargement of the right renal pelvis and collecting system, similar to the comparison CT. Nonobstructing right-sided nephrolithiasis. There appears to be ureteropelvic junction narrowing given the transition. Right renal cortical thinning. - Left Kidney: No left-sided hydronephrosis or nephrolithiasis. No inflammatory changes. Unremarkable course of the left ureter. Small low-density and nonenhancing lesions, most likely cyst though too small to characterize. - Urinary Bladder: Distension of the urinary bladder. Stomach/Bowel: - Stomach: Unremarkable. - Small bowel: Unremarkable - Appendix: Surgical changes in the cecum. Appendix is not visualized, however, no inflammatory changes are present adjacent to the cecum to indicate an appendicitis. - Colon: Surgical changes at the cecum. Diverticula present within transverse colon, descending colon, sigmoid colon. The splenic flexure descending colon, and the rectum are fluid-filled. There is evidence extravasation of contrast involving the descending colon given the appearance of the density on the arterial phase and intensification on the delayed phase imaging. Lymphatic: No lymphadenopathy Mesenteric: No free fluid or air. No mesenteric adenopathy. Reproductive: Hysterectomy Other: No hernia. Musculoskeletal: No acute displaced fracture. Degenerative changes of the spine. Degenerative changes of the hips. No aggressive lytic lesion or sclerotic lesion identified. IMPRESSION: The CT angiogram is positive for GI bleeding involving the descending colon,  presumably diverticular disease. These above results will be called to the ordering clinician or representative by the Radiologist Assistant, and communication documented in the PACS or [employer]. Aortic Atherosclerosis (ICD10-I70.0). 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 Associated nonobstructing nephrolithiasis. Additional ancillary findings as above. Signed, Dulcy Fanny. Dellia Nims, RPVI [facility] Electronically Signed   By: Corrie Mckusick D.O.   On: 10/12/2020 11:36    ASSESMENT:   *   Diverticular bleed in descending colon.  10/13/2019 with angiogram/embolization of small, distal branch left colic artery by Dr. Dwaine Gale.    *   Blood loss anemia. Hgb 1.9 >> 12.7 >> 6.5 >> PRBCs x 3 >> 7.4 >> 4th PRBC currently infusing.    *   Thrombocytopenia, acute/recurrent.  Platelets 67K 1 mg IV vitamin K early a.m. 2/17.  *   Coagulopathy.  INR 4.9 >> 1.6. Low dose IV Vit K and 3 FFP on 2/17 Chronic Coumadin for Mechanical AVR.    *   CHF .  No active HF on CXR.  No resp distress.     PLAN   *   Pt is stable, bleeding presently resolved.  GI will sign off, available prn through secure chat or pager.  Dr Fuller Plan and Chester Holstein NP covering this weekend.     Azucena Freed  10/13/2020, 12:13 PM Phone [phone]     Attending Physician Note   I have taken an interval history, reviewed the chart and examined the patient. I agree with the Advanced Practitioner's note, impression and recommendations.   Acute LGI bleed S/P IR embolization with bleeding controlled. Presumed diverticular bleed in descending colon. Complaining of lower abdominal pain, possible bowel spasm or post procedure. CTA did not show a cause for abdominal pain.  Add  dicyclomine 10 mg ac and hs. Continue clears today. Consider advancing diet tomorrow.   ABL anemia. Received 4 U  PRBCs. Hgb stable at 7.4. Trend CBC.   Coumadin anticoagulation S/P mechanical AVR. IR indicates it's OK to start heparin gtt today as indicated, consider starting without a bolus. Would wait at least 1 more day to resume Coumadin if OK with IR.  GI signing off and available if needed.    Lucio Edward, MD FACG [phone]

## 2020-10-14 ENCOUNTER — Inpatient Hospital Stay (HOSPITAL_COMMUNITY): Payer: Medicare Other

## 2020-10-14 DIAGNOSIS — R1030 Lower abdominal pain, unspecified: Secondary | ICD-10-CM

## 2020-10-14 DIAGNOSIS — K922 Gastrointestinal hemorrhage, unspecified: Secondary | ICD-10-CM | POA: Diagnosis not present

## 2020-10-14 DIAGNOSIS — D62 Acute posthemorrhagic anemia: Secondary | ICD-10-CM | POA: Diagnosis not present

## 2020-10-14 LAB — PREPARE FRESH FROZEN PLASMA: Unit division: 0

## 2020-10-14 LAB — PREPARE RBC (CROSSMATCH)

## 2020-10-14 LAB — PROTIME-INR
INR: 2 — ABNORMAL HIGH (ref 0.8–1.2)
Prothrombin Time: 22 seconds — ABNORMAL HIGH (ref 11.4–15.2)

## 2020-10-14 LAB — BPAM FFP
Blood Product Expiration Date: 202202212359
Blood Product Expiration Date: 202202212359
ISSUE DATE / TIME: 202202171423
ISSUE DATE / TIME: 202202171423
Unit Type and Rh: 6200
Unit Type and Rh: 6200

## 2020-10-14 LAB — BASIC METABOLIC PANEL
Anion gap: 5 (ref 5–15)
BUN: 12 mg/dL (ref 8–23)
CO2: 22 mmol/L (ref 22–32)
Calcium: 7.5 mg/dL — ABNORMAL LOW (ref 8.9–10.3)
Chloride: 110 mmol/L (ref 98–111)
Creatinine, Ser: 0.83 mg/dL (ref 0.44–1.00)
GFR, Estimated: >60 mL/min (ref >60)
Glucose, Bld: 123 mg/dL — ABNORMAL HIGH (ref 70–99)
Potassium: 3.5 mmol/L (ref 3.5–5.1)
Sodium: 137 mmol/L (ref 135–145)

## 2020-10-14 LAB — GLUCOSE, CAPILLARY
Glucose-Capillary: 101 mg/dL — ABNORMAL HIGH (ref 70–99)
Glucose-Capillary: 115 mg/dL — ABNORMAL HIGH (ref 70–99)
Glucose-Capillary: 128 mg/dL — ABNORMAL HIGH (ref 70–99)
Glucose-Capillary: 85 mg/dL (ref 70–99)
Glucose-Capillary: 86 mg/dL (ref 70–99)
Glucose-Capillary: 92 mg/dL (ref 70–99)
Glucose-Capillary: 99 mg/dL (ref 70–99)

## 2020-10-14 LAB — CBC
HCT: 23.2 % — ABNORMAL LOW (ref 36.0–46.0)
Hemoglobin: 7.7 g/dL — ABNORMAL LOW (ref 12.0–15.0)
MCH: 30.7 pg (ref 26.0–34.0)
MCHC: 33.2 g/dL (ref 30.0–36.0)
MCV: 92.4 fL (ref 80.0–100.0)
Platelets: 54 10*3/uL — ABNORMAL LOW (ref 150–400)
RBC: 2.51 MIL/uL — ABNORMAL LOW (ref 3.87–5.11)
RDW: 16.7 % — ABNORMAL HIGH (ref 11.5–15.5)
WBC: 22.7 10*3/uL — ABNORMAL HIGH (ref 4.0–10.5)
nRBC: 0 % (ref 0.0–0.2)

## 2020-10-14 LAB — HEMOGLOBIN AND HEMATOCRIT, BLOOD
HCT: 26.7 % — ABNORMAL LOW (ref 36.0–46.0)
Hemoglobin: 8.9 g/dL — ABNORMAL LOW (ref 12.0–15.0)

## 2020-10-14 LAB — MAGNESIUM: Magnesium: 1.8 mg/dL (ref 1.7–2.4)

## 2020-10-14 MED ORDER — IOHEXOL 300 MG/ML  SOLN
80.0000 mL | Freq: Once | INTRAMUSCULAR | Status: AC | PRN
  Administered 2020-10-14: 80 mL via INTRAVENOUS

## 2020-10-14 MED ORDER — SODIUM CHLORIDE 0.9% IV SOLUTION: Freq: Once | INTRAVENOUS | Status: AC

## 2020-10-14 MED ORDER — IOHEXOL 9 MG/ML PO SOLN
ORAL | Status: AC
  Administered 2020-10-14: 500 mL
  Filled 2020-10-14: qty 1000

## 2020-10-14 MED ORDER — HEPARIN (PORCINE) 25000 UT/250ML-% IV SOLN
750.0000 [IU]/h | INTRAVENOUS | Status: DC
  Filled 2020-10-14: qty 250

## 2020-10-14 NOTE — Progress Notes (Addendum)
Progress Note   Subjective  Complaining of persistent abdominal pain for past few days - might be slightly improved. Very little appetite. No recurrent bleeding noted.    Objective  Vital signs in last 24 hours: Temp:  [98.5 F (36.9 C)-99.9 F (37.7 C)] 99.9 F (37.7 C) (02/19 0822) Pulse Rate:  [66-82] 73 (02/19 0822) Resp:  [11-24] 22 (02/19 0822) BP: (80-114)/(40-67) 111/55 (02/19 0822) SpO2:  [97 %-100 %] 99 % (02/19 0822) Weight:  [59.3 kg] 59.3 kg (02/19 0414) Last BM Date: 10/13/20  General: Alert, well-developed, uncomfortable appearing  Heart:  Regular rate and rhythm; no murmurs Chest: Clear to ascultation bilaterally Abdomen:  Soft, lower abdominal tenderness and nondistended. Normal bowel sounds, without guarding, and without rebound.   Extremities:  Without edema. Neurologic:  Alert and  oriented x4; grossly normal neurologically. Psych:  Alert and cooperative. Normal mood and affect.  Intake/Output from previous day: 02/18 0701 - 02/19 0700 In: 899.2 [I.V.:589.2; Blood:310] Out: 510 [Urine:510] Intake/Output this shift: No intake/output data recorded.  Lab Results: Recent Labs    10/13/20 0336 10/13/20 0436 10/13/20 1723 10/14/20 0411  WBC 13.4* 13.5*  --  22.7*  HGB 7.8* 7.4* 8.6* 7.7*  HCT 24.3* 23.1* 24.7* 23.2*  PLT 67* 67*  --  54*   BMET Recent Labs    10/12/20 0303 10/13/20 0436 10/14/20 0411  NA 138 139 137  K 4.7 4.1 3.5  CL 106 111 110  CO2 21* 21* 22  GLUCOSE 215* 104* 123*  BUN 31* 23 12  CREATININE 1.39* 0.89 0.83  CALCIUM 9.2 7.2* 7.5*   LFT Recent Labs    10/12/20 0303  PROT 5.9*  ALBUMIN 3.4*  AST 23  ALT 15  ALKPHOS 47  BILITOT 0.7   PT/INR Recent Labs    10/13/20 0436 10/14/20 0411  LABPROT 18.8* 22.0*  INR 1.6* 2.0*   Hepatitis Panel No results for input(s): HEPBSAG, HCVAB, HEPAIGM, HEPBIGM in the last 72 hours.  Studies/Results: IR Angiogram Visceral Selective  Result Date:  10/12/2020 INDICATION: 75 year old woman with acute GI bleed in hypertension requiring pressors presents to interventional radiology for angiogram and possible embolization. CT angiography of the abdomen and pelvis showed acute hemorrhage in the descending colon in a background of diverticulosis. Patient's INR was elevated at 4.9 on presentation. Patient is on Coumadin for mechanical mitral valve. INR improved to 1.9 after administration of 2 units of FFP. Given patient's tenuous condition decision was made to perform angiogram and possible embolization. Central venous catheter was placed in anticipation of blood draws, blood product administration, and fluid resuscitation. EXAM: 1. Ultrasound-guided access of left common femoral artery. 2. Superior mesenteric angiogram and selective angiogram of proximal jejunal branches 3. Inferior mesenteric angiogram and selective angiogram and embolization of left colic branches 4. Angio-Seal closure of left common femoral artery access 5. Dual lumen central venous catheter placement MEDICATIONS: None ANESTHESIA/SEDATION: Moderate (conscious) sedation was employed during this procedure. A total of Versed 1 mg and Fentanyl 25 mcg was administered intravenously. Moderate Sedation Time: 2 hours and 9 minutes. The patient's level of consciousness and vital signs were monitored continuously by radiology nursing throughout the procedure under my direct supervision. FLUOROSCOPY TIME:  Fluoroscopy Time: 37 minutes 36 seconds (325 mGy). COMPLICATIONS: None immediate. PROCEDURE: Informed consent was obtained from the patient following explanation of the procedure, risks, benefits and alternatives. The patient understands, agrees and consents for the procedure. All questions were addressed. A time out was performed prior  to the initiation of the procedure. Maximal barrier sterile technique utilized including caps, mask, sterile gowns, sterile gloves, large sterile drape, hand hygiene, and  chlorhexidine prep. Patient positioned supine on the angiography table. Right neck prepped and draped in the usual sterile fashion. All elements of maximal sterile barrier were utilized including, cap, mask, sterile gown, sterile gloves, large sterile drape, hand scrubbing and 2% Chlorhexidine for skin cleaning. The right internal jugular vein was evaluated with ultrasound and shown to be patent. A permanent ultrasound image was obtained and placed in the patient's medical record. Using sterile gel and a sterile probe cover, the right internal jugular vein was entered with a 21 ga needle during real time ultrasound guidance. 0.018 inch guidewire placed and 21 ga needle exchanged for peel-away sheath Dual lumen central venous catheter was inserted over guidewire. The tip was positioned at the cavoatrial junction. All lumens aspirated and flushed well. The catheter was secured to the skin with suture. The insertion site was was covered with bio patch and sterile dressing. Left groin skin was prepped and draped in the usual sterile fashion. All elements of maximal sterile barrier were utilized including, cap, mask, sterile gown, sterile gloves, large sterile drape, hand scrubbing and 2% Chlorhexidine for skin cleaning. The left common femoral artery was evaluated with ultrasound and shown to be patent. A permanent ultrasound image was obtained and placed in the patient's medical record. Using sterile gel and a sterile probe cover, the left common femoral artery was entered with a 21 ga needle during real time ultrasound guidance. 21 gauge needle removed over 0.018 inch guidewire and replaced with a transitional dilator set. Five [place] dilator exchanged for 5 French sheath over 0.035 inch guidewire. The S1 catheter advanced to the superior mesenteric artery and angiogram was performed. No active extravasation was identified. Utilizing Progreat microcatheter, proximal jejunal branch was selected and angiogram was  performed. No flow to the colon was identified. No active extravasation was seen. The inferior mesenteric artery was selected with a Mickelson catheter. Angiogram showed active extravasation in the descending colon. Progreat microcatheter was advanced into the left colic branch and selective angiogram again showed contribution from small branches beyond the marginal artery of [place]. The more superior branch was successfully selected and embolized with a single 2 mm x 2 cm soft interlock coil. Post angiogram demonstrated no significant flow in the embolized artery and no extravasation. Numerous efforts were made to access the more inferior branch extending to the region of extravasation, however none were successful despite utilizing several different wires and micro catheters. Given that no additional extravasation was identified, decision was made to terminate the procedure without embolizing this more inferior branch. Groin sheath angiogram demonstrated appropriate access of the common femoral artery at the level of the femoral head. The groin was re-prepped and draped and closed with a 6 [place] Angio-Seal device. 2 minutes of additional compression applied to achieve hemostasis. IMPRESSION: Inferior mesenteric arteriogram demonstrates active hemorrhage in the descending colon. Two distal branches beyond the marginal artery of [place] appeared to be supplying the area extravasation. The superior of these 2 branches was selected and embolized with a micro coil. Despite prolonged efforts, the more inferior branch could not be selected for embolization. No active extravasation identified at the end of the procedure. Electronically Signed   By: Miachel Roux M.D.   On: 10/12/2020 20:45   IR Angiogram Visceral Selective  Result Date: 10/12/2020 INDICATION: 75 year old woman with acute GI bleed in hypertension requiring  pressors presents to interventional radiology for angiogram and possible embolization. CT  angiography of the abdomen and pelvis showed acute hemorrhage in the descending colon in a background of diverticulosis. Patient's INR was elevated at 4.9 on presentation. Patient is on Coumadin for mechanical mitral valve. INR improved to 1.9 after administration of 2 units of FFP. Given patient's tenuous condition decision was made to perform angiogram and possible embolization. Central venous catheter was placed in anticipation of blood draws, blood product administration, and fluid resuscitation. EXAM: 1. Ultrasound-guided access of left common femoral artery. 2. Superior mesenteric angiogram and selective angiogram of proximal jejunal branches 3. Inferior mesenteric angiogram and selective angiogram and embolization of left colic branches 4. Angio-Seal closure of left common femoral artery access 5. Dual lumen central venous catheter placement MEDICATIONS: None ANESTHESIA/SEDATION: Moderate (conscious) sedation was employed during this procedure. A total of Versed 1 mg and Fentanyl 25 mcg was administered intravenously. Moderate Sedation Time: 2 hours and 9 minutes. The patient's level of consciousness and vital signs were monitored continuously by radiology nursing throughout the procedure under my direct supervision. FLUOROSCOPY TIME:  Fluoroscopy Time: 37 minutes 36 seconds (325 mGy). COMPLICATIONS: None immediate. PROCEDURE: Informed consent was obtained from the patient following explanation of the procedure, risks, benefits and alternatives. The patient understands, agrees and consents for the procedure. All questions were addressed. A time out was performed prior to the initiation of the procedure. Maximal barrier sterile technique utilized including caps, mask, sterile gowns, sterile gloves, large sterile drape, hand hygiene, and chlorhexidine prep. Patient positioned supine on the angiography table. Right neck prepped and draped in the usual sterile fashion. All elements of maximal sterile barrier were  utilized including, cap, mask, sterile gown, sterile gloves, large sterile drape, hand scrubbing and 2% Chlorhexidine for skin cleaning. The right internal jugular vein was evaluated with ultrasound and shown to be patent. A permanent ultrasound image was obtained and placed in the patient's medical record. Using sterile gel and a sterile probe cover, the right internal jugular vein was entered with a 21 ga needle during real time ultrasound guidance. 0.018 inch guidewire placed and 21 ga needle exchanged for peel-away sheath Dual lumen central venous catheter was inserted over guidewire. The tip was positioned at the cavoatrial junction. All lumens aspirated and flushed well. The catheter was secured to the skin with suture. The insertion site was was covered with bio patch and sterile dressing. Left groin skin was prepped and draped in the usual sterile fashion. All elements of maximal sterile barrier were utilized including, cap, mask, sterile gown, sterile gloves, large sterile drape, hand scrubbing and 2% Chlorhexidine for skin cleaning. The left common femoral artery was evaluated with ultrasound and shown to be patent. A permanent ultrasound image was obtained and placed in the patient's medical record. Using sterile gel and a sterile probe cover, the left common femoral artery was entered with a 21 ga needle during real time ultrasound guidance. 21 gauge needle removed over 0.018 inch guidewire and replaced with a transitional dilator set. Five [place] dilator exchanged for 5 French sheath over 0.035 inch guidewire. The S1 catheter advanced to the superior mesenteric artery and angiogram was performed. No active extravasation was identified. Utilizing Progreat microcatheter, proximal jejunal branch was selected and angiogram was performed. No flow to the colon was identified. No active extravasation was seen. The inferior mesenteric artery was selected with a Mickelson catheter. Angiogram showed active  extravasation in the descending colon. Progreat microcatheter was advanced into the  left colic branch and selective angiogram again showed contribution from small branches beyond the marginal artery of [place]. The more superior branch was successfully selected and embolized with a single 2 mm x 2 cm soft interlock coil. Post angiogram demonstrated no significant flow in the embolized artery and no extravasation. Numerous efforts were made to access the more inferior branch extending to the region of extravasation, however none were successful despite utilizing several different wires and micro catheters. Given that no additional extravasation was identified, decision was made to terminate the procedure without embolizing this more inferior branch. Groin sheath angiogram demonstrated appropriate access of the common femoral artery at the level of the femoral head. The groin was re-prepped and draped and closed with a 6 [place] Angio-Seal device. 2 minutes of additional compression applied to achieve hemostasis. IMPRESSION: Inferior mesenteric arteriogram demonstrates active hemorrhage in the descending colon. Two distal branches beyond the marginal artery of [place] appeared to be supplying the area extravasation. The superior of these 2 branches was selected and embolized with a micro coil. Despite prolonged efforts, the more inferior branch could not be selected for embolization. No active extravasation identified at the end of the procedure. Electronically Signed   By: Miachel Roux M.D.   On: 10/12/2020 20:45   IR Fluoro Guide CV Line Right  Result Date: 10/12/2020 INDICATION: 75 year old woman with acute GI bleed in hypertension requiring pressors presents to interventional radiology for angiogram and possible embolization. CT angiography of the abdomen and pelvis showed acute hemorrhage in the descending colon in a background of diverticulosis. Patient's INR was elevated at 4.9 on presentation. Patient is  on Coumadin for mechanical mitral valve. INR improved to 1.9 after administration of 2 units of FFP. Given patient's tenuous condition decision was made to perform angiogram and possible embolization. Central venous catheter was placed in anticipation of blood draws, blood product administration, and fluid resuscitation. EXAM: 1. Ultrasound-guided access of left common femoral artery. 2. Superior mesenteric angiogram and selective angiogram of proximal jejunal branches 3. Inferior mesenteric angiogram and selective angiogram and embolization of left colic branches 4. Angio-Seal closure of left common femoral artery access 5. Dual lumen central venous catheter placement MEDICATIONS: None ANESTHESIA/SEDATION: Moderate (conscious) sedation was employed during this procedure. A total of Versed 1 mg and Fentanyl 25 mcg was administered intravenously. Moderate Sedation Time: 2 hours and 9 minutes. The patient's level of consciousness and vital signs were monitored continuously by radiology nursing throughout the procedure under my direct supervision. FLUOROSCOPY TIME:  Fluoroscopy Time: 37 minutes 36 seconds (325 mGy). COMPLICATIONS: None immediate. PROCEDURE: Informed consent was obtained from the patient following explanation of the procedure, risks, benefits and alternatives. The patient understands, agrees and consents for the procedure. All questions were addressed. A time out was performed prior to the initiation of the procedure. Maximal barrier sterile technique utilized including caps, mask, sterile gowns, sterile gloves, large sterile drape, hand hygiene, and chlorhexidine prep. Patient positioned supine on the angiography table. Right neck prepped and draped in the usual sterile fashion. All elements of maximal sterile barrier were utilized including, cap, mask, sterile gown, sterile gloves, large sterile drape, hand scrubbing and 2% Chlorhexidine for skin cleaning. The right internal jugular vein was evaluated  with ultrasound and shown to be patent. A permanent ultrasound image was obtained and placed in the patient's medical record. Using sterile gel and a sterile probe cover, the right internal jugular vein was entered with a 21 ga needle during real time ultrasound guidance.  0.018 inch guidewire placed and 21 ga needle exchanged for peel-away sheath Dual lumen central venous catheter was inserted over guidewire. The tip was positioned at the cavoatrial junction. All lumens aspirated and flushed well. The catheter was secured to the skin with suture. The insertion site was was covered with bio patch and sterile dressing. Left groin skin was prepped and draped in the usual sterile fashion. All elements of maximal sterile barrier were utilized including, cap, mask, sterile gown, sterile gloves, large sterile drape, hand scrubbing and 2% Chlorhexidine for skin cleaning. The left common femoral artery was evaluated with ultrasound and shown to be patent. A permanent ultrasound image was obtained and placed in the patient's medical record. Using sterile gel and a sterile probe cover, the left common femoral artery was entered with a 21 ga needle during real time ultrasound guidance. 21 gauge needle removed over 0.018 inch guidewire and replaced with a transitional dilator set. Five [place] dilator exchanged for 5 French sheath over 0.035 inch guidewire. The S1 catheter advanced to the superior mesenteric artery and angiogram was performed. No active extravasation was identified. Utilizing Progreat microcatheter, proximal jejunal branch was selected and angiogram was performed. No flow to the colon was identified. No active extravasation was seen. The inferior mesenteric artery was selected with a Mickelson catheter. Angiogram showed active extravasation in the descending colon. Progreat microcatheter was advanced into the left colic branch and selective angiogram again showed contribution from small branches beyond the  marginal artery of [place]. The more superior branch was successfully selected and embolized with a single 2 mm x 2 cm soft interlock coil. Post angiogram demonstrated no significant flow in the embolized artery and no extravasation. Numerous efforts were made to access the more inferior branch extending to the region of extravasation, however none were successful despite utilizing several different wires and micro catheters. Given that no additional extravasation was identified, decision was made to terminate the procedure without embolizing this more inferior branch. Groin sheath angiogram demonstrated appropriate access of the common femoral artery at the level of the femoral head. The groin was re-prepped and draped and closed with a 6 [place] Angio-Seal device. 2 minutes of additional compression applied to achieve hemostasis. IMPRESSION: Inferior mesenteric arteriogram demonstrates active hemorrhage in the descending colon. Two distal branches beyond the marginal artery of [place] appeared to be supplying the area extravasation. The superior of these 2 branches was selected and embolized with a micro coil. Despite prolonged efforts, the more inferior branch could not be selected for embolization. No active extravasation identified at the end of the procedure. Electronically Signed   By: Miachel Roux M.D.   On: 10/12/2020 20:45   IR US Guide Vasc Access Left  Result Date: 10/12/2020 INDICATION: 75 year old woman with acute GI bleed in hypertension requiring pressors presents to interventional radiology for angiogram and possible embolization. CT angiography of the abdomen and pelvis showed acute hemorrhage in the descending colon in a background of diverticulosis. Patient's INR was elevated at 4.9 on presentation. Patient is on Coumadin for mechanical mitral valve. INR improved to 1.9 after administration of 2 units of FFP. Given patient's tenuous condition decision was made to perform angiogram and  possible embolization. Central venous catheter was placed in anticipation of blood draws, blood product administration, and fluid resuscitation. EXAM: 1. Ultrasound-guided access of left common femoral artery. 2. Superior mesenteric angiogram and selective angiogram of proximal jejunal branches 3. Inferior mesenteric angiogram and selective angiogram and embolization of left colic branches 4.  Angio-Seal closure of left common femoral artery access 5. Dual lumen central venous catheter placement MEDICATIONS: None ANESTHESIA/SEDATION: Moderate (conscious) sedation was employed during this procedure. A total of Versed 1 mg and Fentanyl 25 mcg was administered intravenously. Moderate Sedation Time: 2 hours and 9 minutes. The patient's level of consciousness and vital signs were monitored continuously by radiology nursing throughout the procedure under my direct supervision. FLUOROSCOPY TIME:  Fluoroscopy Time: 37 minutes 36 seconds (325 mGy). COMPLICATIONS: None immediate. PROCEDURE: Informed consent was obtained from the patient following explanation of the procedure, risks, benefits and alternatives. The patient understands, agrees and consents for the procedure. All questions were addressed. A time out was performed prior to the initiation of the procedure. Maximal barrier sterile technique utilized including caps, mask, sterile gowns, sterile gloves, large sterile drape, hand hygiene, and chlorhexidine prep. Patient positioned supine on the angiography table. Right neck prepped and draped in the usual sterile fashion. All elements of maximal sterile barrier were utilized including, cap, mask, sterile gown, sterile gloves, large sterile drape, hand scrubbing and 2% Chlorhexidine for skin cleaning. The right internal jugular vein was evaluated with ultrasound and shown to be patent. A permanent ultrasound image was obtained and placed in the patient's medical record. Using sterile gel and a sterile probe cover, the  right internal jugular vein was entered with a 21 ga needle during real time ultrasound guidance. 0.018 inch guidewire placed and 21 ga needle exchanged for peel-away sheath Dual lumen central venous catheter was inserted over guidewire. The tip was positioned at the cavoatrial junction. All lumens aspirated and flushed well. The catheter was secured to the skin with suture. The insertion site was was covered with bio patch and sterile dressing. Left groin skin was prepped and draped in the usual sterile fashion. All elements of maximal sterile barrier were utilized including, cap, mask, sterile gown, sterile gloves, large sterile drape, hand scrubbing and 2% Chlorhexidine for skin cleaning. The left common femoral artery was evaluated with ultrasound and shown to be patent. A permanent ultrasound image was obtained and placed in the patient's medical record. Using sterile gel and a sterile probe cover, the left common femoral artery was entered with a 21 ga needle during real time ultrasound guidance. 21 gauge needle removed over 0.018 inch guidewire and replaced with a transitional dilator set. Five [place] dilator exchanged for 5 French sheath over 0.035 inch guidewire. The S1 catheter advanced to the superior mesenteric artery and angiogram was performed. No active extravasation was identified. Utilizing Progreat microcatheter, proximal jejunal branch was selected and angiogram was performed. No flow to the colon was identified. No active extravasation was seen. The inferior mesenteric artery was selected with a Mickelson catheter. Angiogram showed active extravasation in the descending colon. Progreat microcatheter was advanced into the left colic branch and selective angiogram again showed contribution from small branches beyond the marginal artery of [place]. The more superior branch was successfully selected and embolized with a single 2 mm x 2 cm soft interlock coil. Post angiogram demonstrated no  significant flow in the embolized artery and no extravasation. Numerous efforts were made to access the more inferior branch extending to the region of extravasation, however none were successful despite utilizing several different wires and micro catheters. Given that no additional extravasation was identified, decision was made to terminate the procedure without embolizing this more inferior branch. Groin sheath angiogram demonstrated appropriate access of the common femoral artery at the level of the femoral head. The groin was  re-prepped and draped and closed with a 6 [place] Angio-Seal device. 2 minutes of additional compression applied to achieve hemostasis. IMPRESSION: Inferior mesenteric arteriogram demonstrates active hemorrhage in the descending colon. Two distal branches beyond the marginal artery of [place] appeared to be supplying the area extravasation. The superior of these 2 branches was selected and embolized with a micro coil. Despite prolonged efforts, the more inferior branch could not be selected for embolization. No active extravasation identified at the end of the procedure. Electronically Signed   By: Miachel Roux M.D.   On: 10/12/2020 20:45   IR US Guide Vasc Access Right  Result Date: 10/12/2020 INDICATION: 75 year old woman with acute GI bleed in hypertension requiring pressors presents to interventional radiology for angiogram and possible embolization. CT angiography of the abdomen and pelvis showed acute hemorrhage in the descending colon in a background of diverticulosis. Patient's INR was elevated at 4.9 on presentation. Patient is on Coumadin for mechanical mitral valve. INR improved to 1.9 after administration of 2 units of FFP. Given patient's tenuous condition decision was made to perform angiogram and possible embolization. Central venous catheter was placed in anticipation of blood draws, blood product administration, and fluid resuscitation. EXAM: 1. Ultrasound-guided  access of left common femoral artery. 2. Superior mesenteric angiogram and selective angiogram of proximal jejunal branches 3. Inferior mesenteric angiogram and selective angiogram and embolization of left colic branches 4. Angio-Seal closure of left common femoral artery access 5. Dual lumen central venous catheter placement MEDICATIONS: None ANESTHESIA/SEDATION: Moderate (conscious) sedation was employed during this procedure. A total of Versed 1 mg and Fentanyl 25 mcg was administered intravenously. Moderate Sedation Time: 2 hours and 9 minutes. The patient's level of consciousness and vital signs were monitored continuously by radiology nursing throughout the procedure under my direct supervision. FLUOROSCOPY TIME:  Fluoroscopy Time: 37 minutes 36 seconds (325 mGy). COMPLICATIONS: None immediate. PROCEDURE: Informed consent was obtained from the patient following explanation of the procedure, risks, benefits and alternatives. The patient understands, agrees and consents for the procedure. All questions were addressed. A time out was performed prior to the initiation of the procedure. Maximal barrier sterile technique utilized including caps, mask, sterile gowns, sterile gloves, large sterile drape, hand hygiene, and chlorhexidine prep. Patient positioned supine on the angiography table. Right neck prepped and draped in the usual sterile fashion. All elements of maximal sterile barrier were utilized including, cap, mask, sterile gown, sterile gloves, large sterile drape, hand scrubbing and 2% Chlorhexidine for skin cleaning. The right internal jugular vein was evaluated with ultrasound and shown to be patent. A permanent ultrasound image was obtained and placed in the patient's medical record. Using sterile gel and a sterile probe cover, the right internal jugular vein was entered with a 21 ga needle during real time ultrasound guidance. 0.018 inch guidewire placed and 21 ga needle exchanged for peel-away sheath  Dual lumen central venous catheter was inserted over guidewire. The tip was positioned at the cavoatrial junction. All lumens aspirated and flushed well. The catheter was secured to the skin with suture. The insertion site was was covered with bio patch and sterile dressing. Left groin skin was prepped and draped in the usual sterile fashion. All elements of maximal sterile barrier were utilized including, cap, mask, sterile gown, sterile gloves, large sterile drape, hand scrubbing and 2% Chlorhexidine for skin cleaning. The left common femoral artery was evaluated with ultrasound and shown to be patent. A permanent ultrasound image was obtained and placed in the patient's medical  record. Using sterile gel and a sterile probe cover, the left common femoral artery was entered with a 21 ga needle during real time ultrasound guidance. 21 gauge needle removed over 0.018 inch guidewire and replaced with a transitional dilator set. Five [place] dilator exchanged for 5 French sheath over 0.035 inch guidewire. The S1 catheter advanced to the superior mesenteric artery and angiogram was performed. No active extravasation was identified. Utilizing Progreat microcatheter, proximal jejunal branch was selected and angiogram was performed. No flow to the colon was identified. No active extravasation was seen. The inferior mesenteric artery was selected with a Mickelson catheter. Angiogram showed active extravasation in the descending colon. Progreat microcatheter was advanced into the left colic branch and selective angiogram again showed contribution from small branches beyond the marginal artery of [place]. The more superior branch was successfully selected and embolized with a single 2 mm x 2 cm soft interlock coil. Post angiogram demonstrated no significant flow in the embolized artery and no extravasation. Numerous efforts were made to access the more inferior branch extending to the region of extravasation, however none  were successful despite utilizing several different wires and micro catheters. Given that no additional extravasation was identified, decision was made to terminate the procedure without embolizing this more inferior branch. Groin sheath angiogram demonstrated appropriate access of the common femoral artery at the level of the femoral head. The groin was re-prepped and draped and closed with a 6 [place] Angio-Seal device. 2 minutes of additional compression applied to achieve hemostasis. IMPRESSION: Inferior mesenteric arteriogram demonstrates active hemorrhage in the descending colon. Two distal branches beyond the marginal artery of [place] appeared to be supplying the area extravasation. The superior of these 2 branches was selected and embolized with a micro coil. Despite prolonged efforts, the more inferior branch could not be selected for embolization. No active extravasation identified at the end of the procedure. Electronically Signed   By: Miachel Roux M.D.   On: 10/12/2020 20:45   DG Chest Port 1 View  Result Date: 10/13/2020 CLINICAL DATA:  Vomiting EXAM: PORTABLE CHEST 1 VIEW COMPARISON:  Yesterday FINDINGS: Cardiopericardial enlargement. There is biventricular pacer leads and mitral valve replacement. New right IJ line with tip at the upper right atrium. There is no edema, consolidation, effusion, or pneumothorax. IMPRESSION: No evidence of active disease. Electronically Signed   By: Monte Fantasia M.D.   On: 10/13/2020 06:40   IR EMBO ART  VEN HEMORR LYMPH EXTRAV  INC GUIDE ROADMAPPING  Result Date: 10/12/2020 INDICATION: 75 year old woman with acute GI bleed in hypertension requiring pressors presents to interventional radiology for angiogram and possible embolization. CT angiography of the abdomen and pelvis showed acute hemorrhage in the descending colon in a background of diverticulosis. Patient's INR was elevated at 4.9 on presentation. Patient is on Coumadin for mechanical mitral valve.  INR improved to 1.9 after administration of 2 units of FFP. Given patient's tenuous condition decision was made to perform angiogram and possible embolization. Central venous catheter was placed in anticipation of blood draws, blood product administration, and fluid resuscitation. EXAM: 1. Ultrasound-guided access of left common femoral artery. 2. Superior mesenteric angiogram and selective angiogram of proximal jejunal branches 3. Inferior mesenteric angiogram and selective angiogram and embolization of left colic branches 4. Angio-Seal closure of left common femoral artery access 5. Dual lumen central venous catheter placement MEDICATIONS: None ANESTHESIA/SEDATION: Moderate (conscious) sedation was employed during this procedure. A total of Versed 1 mg and Fentanyl 25 mcg was administered intravenously. Moderate  Sedation Time: 2 hours and 9 minutes. The patient's level of consciousness and vital signs were monitored continuously by radiology nursing throughout the procedure under my direct supervision. FLUOROSCOPY TIME:  Fluoroscopy Time: 37 minutes 36 seconds (325 mGy). COMPLICATIONS: None immediate. PROCEDURE: Informed consent was obtained from the patient following explanation of the procedure, risks, benefits and alternatives. The patient understands, agrees and consents for the procedure. All questions were addressed. A time out was performed prior to the initiation of the procedure. Maximal barrier sterile technique utilized including caps, mask, sterile gowns, sterile gloves, large sterile drape, hand hygiene, and chlorhexidine prep. Patient positioned supine on the angiography table. Right neck prepped and draped in the usual sterile fashion. All elements of maximal sterile barrier were utilized including, cap, mask, sterile gown, sterile gloves, large sterile drape, hand scrubbing and 2% Chlorhexidine for skin cleaning. The right internal jugular vein was evaluated with ultrasound and shown to be patent.  A permanent ultrasound image was obtained and placed in the patient's medical record. Using sterile gel and a sterile probe cover, the right internal jugular vein was entered with a 21 ga needle during real time ultrasound guidance. 0.018 inch guidewire placed and 21 ga needle exchanged for peel-away sheath Dual lumen central venous catheter was inserted over guidewire. The tip was positioned at the cavoatrial junction. All lumens aspirated and flushed well. The catheter was secured to the skin with suture. The insertion site was was covered with bio patch and sterile dressing. Left groin skin was prepped and draped in the usual sterile fashion. All elements of maximal sterile barrier were utilized including, cap, mask, sterile gown, sterile gloves, large sterile drape, hand scrubbing and 2% Chlorhexidine for skin cleaning. The left common femoral artery was evaluated with ultrasound and shown to be patent. A permanent ultrasound image was obtained and placed in the patient's medical record. Using sterile gel and a sterile probe cover, the left common femoral artery was entered with a 21 ga needle during real time ultrasound guidance. 21 gauge needle removed over 0.018 inch guidewire and replaced with a transitional dilator set. Five [place] dilator exchanged for 5 French sheath over 0.035 inch guidewire. The S1 catheter advanced to the superior mesenteric artery and angiogram was performed. No active extravasation was identified. Utilizing Progreat microcatheter, proximal jejunal branch was selected and angiogram was performed. No flow to the colon was identified. No active extravasation was seen. The inferior mesenteric artery was selected with a Mickelson catheter. Angiogram showed active extravasation in the descending colon. Progreat microcatheter was advanced into the left colic branch and selective angiogram again showed contribution from small branches beyond the marginal artery of [place]. The more superior  branch was successfully selected and embolized with a single 2 mm x 2 cm soft interlock coil. Post angiogram demonstrated no significant flow in the embolized artery and no extravasation. Numerous efforts were made to access the more inferior branch extending to the region of extravasation, however none were successful despite utilizing several different wires and micro catheters. Given that no additional extravasation was identified, decision was made to terminate the procedure without embolizing this more inferior branch. Groin sheath angiogram demonstrated appropriate access of the common femoral artery at the level of the femoral head. The groin was re-prepped and draped and closed with a 6 [place] Angio-Seal device. 2 minutes of additional compression applied to achieve hemostasis. IMPRESSION: Inferior mesenteric arteriogram demonstrates active hemorrhage in the descending colon. Two distal branches beyond the marginal artery of [place]  appeared to be supplying the area extravasation. The superior of these 2 branches was selected and embolized with a micro coil. Despite prolonged efforts, the more inferior branch could not be selected for embolization. No active extravasation identified at the end of the procedure. Electronically Signed   By: Miachel Roux M.D.   On: 10/12/2020 20:45   ECHOCARDIOGRAM LIMITED  Result Date: 10/12/2020    ECHOCARDIOGRAM LIMITED REPORT   Patient Name:   Angela [name] Date of Exam: 10/12/2020 Medical Rec #:  [id number]    Height:       66.0 in Accession #:    [id number]   Weight:       134.0 lb Date of Birth:  01-Oct-1945    BSA:          1.687 m Patient Age:    75 years     BP:           80/58 mmHg Patient Gender: F            HR:           70 bpm. Exam Location:  Inpatient Procedure: Limited Echo, Color Doppler and Cardiac Doppler Indications:    R07.9* Chest pain, unspecified  History:        Patient has prior history of Echocardiogram examinations, most                 recent  10/27/2018. Defibrillator; Arrythmias:Atrial Fibrillation.                 Mechanical Mitral Valve Replacement.                  Mitral Valve: mechanical valve valve is present in the mitral                 position.  Sonographer:    Raquel Sarna Senior RDCS Referring Phys: [id number] Donita Brooks  Sonographer Comments: Limited to recheck EF per notes. IMPRESSIONS  1. Left ventricular ejection fraction, by estimation, is <20%. The left ventricle has severely decreased function. The left ventricle demonstrates global hypokinesis. The left ventricular internal cavity size was severely dilated.  2. Left atrial size was severely dilated.  3. Right atrial size was severely dilated.  4. The mitral valve has been repaired/replaced. There is a mechanical valve present in the mitral position.  5. The aortic valve is tricuspid. Aortic valve regurgitation is trivial.  6. The inferior vena cava is normal in size with greater than 50% respiratory variability, suggesting right atrial pressure of 3 mmHg. Comparison(s): Prior images reviewed side by side. Conclusion(s)/Recommendation(s): Limited echo for EF, which remains severely reduced at <20%. Prior images reviewed and are similar. FINDINGS  Left Ventricle: Left ventricular ejection fraction, by estimation, is <20%. The left ventricle has severely decreased function. The left ventricle demonstrates global hypokinesis. The left ventricular internal cavity size was severely dilated. Left Atrium: Left atrial size was severely dilated. Right Atrium: Right atrial size was severely dilated. Mitral Valve: The mitral valve has been repaired/replaced. There is a mechanical valve present in the mitral position. Tricuspid Valve: The tricuspid valve is grossly normal. Tricuspid valve regurgitation is trivial. Aortic Valve: The aortic valve is tricuspid. Aortic valve regurgitation is trivial. Venous: The inferior vena cava is normal in size with greater than 50% respiratory variability, suggesting  right atrial pressure of 3 mmHg. Additional Comments: A pacer wire is visualized in the right atrium and right ventricle. RIGHT VENTRICLE RV S prime:  5.33 cm/s TAPSE (M-mode): 1.3 cm Buford Dresser MD Electronically signed by Buford Dresser MD Signature Date/Time: 10/12/2020/9:10:35 PM    Final    CT Angio Abd/Pel w/ and/or w/o  Result Date: 10/12/2020 CLINICAL DATA:  75 year old female with a history of melena EXAM: CTA ABDOMEN AND PELVIS WITHOUT AND WITH CONTRAST TECHNIQUE: Multidetector CT imaging of the abdomen and pelvis was performed using the standard protocol during bolus administration of intravenous contrast. Multiplanar reconstructed images and MIPs were obtained and reviewed to evaluate the vascular anatomy. CONTRAST:  36mL OMNIPAQUE IOHEXOL 350 MG/ML SOLN COMPARISON:  11/26/2018 FINDINGS: VASCULAR Aorta: Mild atherosclerotic changes of the abdominal aorta. No aneurysm or dissection. No periaortic fluid. Celiac: Celiac artery patent. Minimal atherosclerosis. The configuration and diameter of the celiac artery proximally suggests compression from the overlying diaphragmatic cruise. SMA: SMA patent with no significant atherosclerotic changes. Renals: - Right: Single right renal artery. No significant calcified plaque at the origin. Small caliber right renal artery. - Left: Single left renal artery. No significant atherosclerotic changes. IMA: IMA is patent. Right lower extremity: Unremarkable course, caliber, and contour of the right iliac system. No aneurysm, dissection, or occlusion. Minimal atherosclerosis of the right iliac system. Hypogastric artery is patent. Common femoral artery patent. Postsurgical changes adjacent to the right common femoral artery without focal fluid or evidence pseudoaneurysm. Proximal SFA and profunda femoris patent. Left lower extremity: Unremarkable course, caliber, and contour of the left iliac system. No aneurysm, dissection, or occlusion. Minimal  atherosclerosis of the left iliac system. Hypogastric artery is patent. Common femoral artery patent. Proximal SFA and profunda femoris patent. Veins: Unremarkable appearance of the venous system. Review of the MIP images confirms the above findings. NON-VASCULAR Lower chest: Cardiomegaly, incompletely imaged. Cardiac pacing leads are present Hepatobiliary: Unremarkable appearance of the liver. Noncalcified cholelithiasis is evident. No focal inflammatory changes. Pancreas: Unremarkable. Spleen: Unremarkable. Adrenals/Urinary Tract: - Right adrenal gland: Unremarkable - Left adrenal gland: Unremarkable. - Right kidney: Enlargement of the right renal pelvis and collecting system, similar to the comparison CT. Nonobstructing right-sided nephrolithiasis. There appears to be ureteropelvic junction narrowing given the transition. Right renal cortical thinning. - Left Kidney: No left-sided hydronephrosis or nephrolithiasis. No inflammatory changes. Unremarkable course of the left ureter. Small low-density and nonenhancing lesions, most likely cyst though too small to characterize. - Urinary Bladder: Distension of the urinary bladder. Stomach/Bowel: - Stomach: Unremarkable. - Small bowel: Unremarkable - Appendix: Surgical changes in the cecum. Appendix is not visualized, however, no inflammatory changes are present adjacent to the cecum to indicate an appendicitis. - Colon: Surgical changes at the cecum. Diverticula present within transverse colon, descending colon, sigmoid colon. The splenic flexure descending colon, and the rectum are fluid-filled. There is evidence extravasation of contrast involving the descending colon given the appearance of the density on the arterial phase and intensification on the delayed phase imaging. Lymphatic: No lymphadenopathy Mesenteric: No free fluid or air. No mesenteric adenopathy. Reproductive: Hysterectomy Other: No hernia. Musculoskeletal: No acute displaced fracture. Degenerative  changes of the spine. Degenerative changes of the hips. No aggressive lytic lesion or sclerotic lesion identified. IMPRESSION: The CT angiogram is positive for GI bleeding involving the descending colon, presumably diverticular disease. These above results will be called to the ordering clinician or representative by the Radiologist Assistant, and communication documented in the PACS or [employer]. Aortic Atherosclerosis (ICD10-I70.0). 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 Associated nonobstructing nephrolithiasis. Additional ancillary findings as above.  Signed, Dulcy Fanny. Dellia Nims, RPVI [facility] Radiology Electronically Signed   By: Corrie Mckusick D.O.   On: 10/12/2020 11:36      Assessment & Recommendations   1. Acute LGI bleed resolved post IR embolization. Presumed descending colon diverticular bleed.   2. ABL anemia. Receiving 1 U PRBC today.  3. Persistent lower abdominal pain with WBC increased to 22.7k, etiology unclear. Repeat CT AP today.   4. Coumadin anticoagulation S/P AVR. OK to resume IV heparin. Hold Coumadin for now   5. Thrombocytopenia. Plts have decreased to 54k.     LOS: 2 days   Norberto Sorenson T. Fuller Plan MD 10/14/2020, 8:28 AM [phone]

## 2020-10-14 NOTE — Progress Notes (Signed)
Still have not heard from Dr. Fuller Plan regarding Heparin drip. Paged again.

## 2020-10-14 NOTE — Progress Notes (Signed)
Approximately 300ccs dark blood with large blood clots noted in bedpan upon turning pt. Frank red blood noted on washcloth while performing pericare. VS stable, pt in NAD. Will continue to monitor and pass on in report to night shift RN.

## 2020-10-14 NOTE — Progress Notes (Signed)
ANTICOAGULATION CONSULT NOTE - Initial Consult  Pharmacy Consult for IV Heparin Indication: atrial fibrillation and Mechanical valve  No Known Allergies  Patient Measurements: Height: 5\' 6"  (167.6 cm) Weight: 59.3 kg (130 lb 11.7 oz) IBW/kg (Calculated) : 59.3 Heparin Dosing Weight: 59.3 kg  Vital Signs: Temp: 99.9 F (37.7 C) (02/19 0822) Temp Source: Oral (02/19 0822) BP: 99/52 (02/19 1000) Pulse Rate: 73 (02/19 1000)  Labs: Recent Labs    10/12/20 0303 10/12/20 0304 10/12/20 0315 10/12/20 0448 10/12/20 0606 10/12/20 1434 10/12/20 1800 10/13/20 0336 10/13/20 0436 10/13/20 1723 10/14/20 0411  HGB 12.9  --   --   --  9.9*   < > 6.5* 7.8* 7.4* 8.6* 7.7*  HCT 42.4  --   --   --  33.2*   < > 19.8* 24.3* 23.1* 24.7* 23.2*  PLT 187  --   --   --  144*   < >  --  67* 67*  --  54*  LABPROT  --    < >  --   --   --    < > 20.7*  --  18.8*  --  22.0*  INR  --    < >  --   --   --    < > 1.9*  --  1.6*  --  2.0*  CREATININE 1.39*  --   --   --   --   --   --   --  0.89  --  0.83  TROPONINIHS  --   --  120* 112* 119*  --   --   --   --   --   --    < > = values in this interval not displayed.    Estimated Creatinine Clearance: 54 mL/min (by C-G formula based on SCr of 0.83 mg/dL).   Medical History: Past Medical History:  Diagnosis Date  . Abnormality of gait 08/23/2014  . Arthritis   . Atrial fibrillation ([place])    A.  Chronic Coumadin  . Cardiac arrest - ventricular fibrillation    A.  12/2007;  B. 01/2008 St[employer] Jude Promote Bi-V ICD placed  . CVA (cerebral vascular accident) ([place])   . Diverticular disease   . DVT (deep venous thrombosis) ([place])   . 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 History of colon cancer    A.  1999 - T3, N1  chemotherapy  . Internal hemorrhoids   . Memory deficit 08/23/2014  . Nonischemic cardiomyopathy ([place])    a.  11/2001 - Cath - NL Cors; b. ECHO 09/22/11: EF 20%, MVR normal,  moderate LAE, mild RAE c. ECHO (08/2012): ED 20%, diff HK, LA mod dilated, mild/mod TR, RV mild/mod decreased sys fx  . Rectal bleeding 04/28/2017  . Rheumatic heart disease    A. 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 Sick sinus syndrome ([place])    A.  s/p pacer in 1995.  B.    . Systolic CHF, chronic ([place])    A.  01/2008 Echo - EF 10-20%  . UTI (urinary tract infection) 01/19/2019    Assessment: 75 year old female on Warfarin prior to admission for mechanical valve and atrial fibrillation admitted 2/17 with lower GIB. Now patient is status post embolization without recurrent bleeding. Pharmacy consulted to resume IV Heparin today (continuing to hold Warfarin per GI recommendations at this time).   Hgb 7.7 -  receiving 1 unit PRBC this am. No recurrent bleeding noted.   INR up at 2 (goal 2.5 to 3.5) and Platelets down to 54 - MD aware. LFTs wnl on 2/17.   Goal of Therapy:  Heparin level 0.3-0.7 units/ml Monitor platelets by anticoagulation protocol: Yes   Plan:  Start IV Heparin (no bolus) at 750 units/hr.  Heparin level in 6-8 hours.  Daily Heparin level, CBC, and INR.  Follow-up plans to resume Warfarin.   Sloan Leiter, PharmD, BCPS, BCCCP Clinical Pharmacist Please refer to [facility] for [place] numbers 10/14/2020,11:15 AM

## 2020-10-14 NOTE — Progress Notes (Signed)
Pt noted to have bloody BM again.  Will hold heparin gtt for now.  GI has ordered repeat CT abd.  Chesley Mires, MD [place] Pager - [phone] 10/14/2020, 4:08 PM

## 2020-10-14 NOTE — Progress Notes (Signed)
[facility] Progress Note Patient Name: Angela [name] DOB: [birth year] MRN: [id number]   Date of Service  10/14/2020  HPI/Events of Note  BP 82/70, Hemoglobin 7.7 gm / dl. Goal hemoglobin per Dr. Halford Chessman is > or = to 8 gm / dl.  eICU Interventions  Order entered to transfuse 1 unit PRBC.        Frederik Pear 10/14/2020, 6:43 AM

## 2020-10-14 NOTE — Progress Notes (Signed)
Pt has finished drinking first bottle of oral contrast. 2nd bottle to be started at 1600.

## 2020-10-14 NOTE — Progress Notes (Signed)
A small amount of bright red blood with small clots was noticed today coming from patient's rectum. Dr. Fuller Plan paged to inform him the rectal bleeding has returned. This RN will hold the heparin drip infusion until further instructions received by Dr. Fuller Plan. VSS, patient is in no acute distress. Skin warm and dry.

## 2020-10-14 NOTE — Progress Notes (Signed)
Pt transported to/ from CT by RN, CT tech, and RN student with no incident.

## 2020-10-14 NOTE — Progress Notes (Signed)
NAME:  Angela [name], MRN:  [id number], DOB:  11-09-1945, LOS: 2 ADMISSION DATE:  10/12/2020, CONSULTATION DATE:  10/12/2020 REFERRING MD:  Lorin Mercy, CHIEF COMPLAINT:  Rectal bleeding  Brief History:  75 yo female presented with weakness, chest pain and rectal bleeding.  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  Seen by GI.  IR consulted & patient underwent embolization for bleeding in distal colon.  Transferred to ICU due to persistent hypotension.   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 To IR for embolization.  2/18 Hgb 7.4, one bloody BM overnight  2/19 transfuse PRBC  Consults:  GI- [place] >> s/o 2/18 IR  Procedures:    Significant Diagnostic Tests:   CTA Abd / pelvis 2/17 >> positive for GIB involving the descending colon, presumably diverticular disease  Limited ECHO 2/17 >> LVEF <20%, LV with severely decreased function with global hypokinesis & severely dilated, LA severely dilated, RA severely dilated, mechanical mitral valve (similar ECHO to prior)  Micro Data:  COVID 2/17 >> neg Flu 2/17 >> neg  Antimicrobials:     Interim History / Subjective:  Remains pressors.  Scheduled for PRBC transfusion.  Objective   Blood pressure (!) 97/47, pulse 69, temperature 98.5 F (36.9 C), temperature source Oral, resp. rate 18, height 5\' 6"  (1.676 m), weight 59.3 kg, SpO2 99 %.        Intake/Output Summary (Last 24 hours) at 10/14/2020 0741 Last data filed at 10/14/2020 0600 Gross per 24 hour  Intake 899.18 ml  Output 510 ml  Net 389.18 ml   Filed  Weights   10/12/20 0241 10/13/20 0440 10/14/20 0414  Weight: 60.8 kg 60.7 kg 59.3 kg   Examination:  General - alert Eyes - pupils reactive ENT - no sinus tenderness, no stridor Cardiac - regular rate/rhythm, 2/6 SM Chest - equal breath sounds b/l, no wheezing or rales Abdomen - soft, non tender, + bowel sounds Extremities - no cyanosis, clubbing, or edema Skin - no rashes Neuro - normal strength, moves extremities, follows commands Psych - normal mood and behavior  Resolved Hospital Problem list   AKI from ATN 2nd to hypotension, Hypomagnesemia, Nausea with vomiting, Hyperglycemia from acute stress  Assessment & Plan:   Diverticular bleeding with hemorrhagic shock. - s/p embolization by IR on 2/17 - no further bleeding - wean pressors to keep MAP > 65  Acute blood loss anemia. - goal Hb > 8 given cardiac hx - transfuse 1 unit PRBC on 2/19 - f/u CBC  Hx of A fib, mechanical MVR. - resume coumadin on 2/19  Hx of non ischemic CM with chronic systolic CHF, SSS, VF s/p ICD. - continue amiodarone, digoxin  Chronic hypotension. - continue midodrine  Thrombocytopenia. - likely from consumption - f/u CBC  Hx of mild dementia, chronic pain, anxiety with chronic benzo use. - prn xanax, morphine - delirium precautions   Deconditioning. - PT/OT  Best practice (evaluated daily)  Diet: regular DVT prophylaxis: coumadin GI prophylaxis: protonix Mobility: oob to chair Disposition: ICU  Goals of Care:  Last date of multidisciplinary goals of care discussion:  Family and staff present:  Summary of discussion:  Follow up goals of care discussion due:  Code Status: Full.    Labs    CMP Latest Ref Rng & Units 10/14/2020 10/13/2020 10/12/2020  Glucose 70 - 99 mg/dL 123(H) 104(H) 215(H)  BUN 8 - 23 mg/dL 12 23 31(H)  Creatinine 0.44 - 1.00 mg/dL 0.83 0.89 1.39(H)  Sodium 135 - 145 mmol/L 137 139 138  Potassium 3.5 - 5.1 mmol/L 3.5 4.1 4.7  Chloride 98 - 111 mmol/L 110  111 106  CO2 22 - 32 mmol/L 22 21(L) 21(L)  Calcium 8.9 - 10.3 mg/dL 7.5(L) 7.2(L) 9.2  Total Protein 6.5 - 8.1 g/dL - - 5.9(L)  Total Bilirubin 0.3 - 1.2 mg/dL - - 0.7  Alkaline Phos 38 - 126 U/L - - 47  AST 15 - 41 U/L - - 23  ALT 0 - 44 U/L - - 15    CBC Latest Ref Rng & Units 10/14/2020 10/13/2020 10/13/2020  WBC 4.0 - 10.5 K/uL 22.7(H) - 13.5(H)  Hemoglobin 12.0 - 15.0 g/dL 7.7(L) 8.6(L) 7.4(L)  Hematocrit 36.0 - 46.0 % 23.2(L) 24.7(L) 23.1(L)  Platelets 150 - 400 K/uL 54(L) - 67(L)    Signature:  Chesley Mires, MD [place] Pager - [phone] 10/14/2020, 7:51 AM

## 2020-10-14 NOTE — Progress Notes (Signed)
This RN has called CT to determine when the patient can have her CT scan done; was told she needs to drink oral contrast and that they will be bringing it up to her soon. Will drink one bottle, wait one hour and then drink second bottle and then will have CT done one hour after finishing second bottle of oral CT contrast. Awaiting on CT tech to bring contrast to patient.

## 2020-10-15 DIAGNOSIS — R1030 Lower abdominal pain, unspecified: Secondary | ICD-10-CM | POA: Diagnosis not present

## 2020-10-15 DIAGNOSIS — D62 Acute posthemorrhagic anemia: Secondary | ICD-10-CM | POA: Diagnosis not present

## 2020-10-15 DIAGNOSIS — K922 Gastrointestinal hemorrhage, unspecified: Secondary | ICD-10-CM | POA: Diagnosis not present

## 2020-10-15 LAB — BPAM RBC
Blood Product Expiration Date: 202202212359
Blood Product Expiration Date: 202202212359
Blood Product Expiration Date: 202203012359
Blood Product Expiration Date: 202203192359
Blood Product Expiration Date: 202203192359
ISSUE DATE / TIME: 202202170707
ISSUE DATE / TIME: 202202170919
ISSUE DATE / TIME: 202202172200
ISSUE DATE / TIME: 202202181153
ISSUE DATE / TIME: 202202190741
Unit Type and Rh: 5100
Unit Type and Rh: 5100
Unit Type and Rh: 6200
Unit Type and Rh: 6200
Unit Type and Rh: 6200

## 2020-10-15 LAB — TYPE AND SCREEN
ABO/RH(D): A POS
Antibody Screen: NEGATIVE
Donor AG Type: NEGATIVE
Donor AG Type: NEGATIVE
Donor AG Type: NEGATIVE
Donor AG Type: NEGATIVE
Donor AG Type: NEGATIVE
Unit division: 0
Unit division: 0
Unit division: 0
Unit division: 0
Unit division: 0

## 2020-10-15 LAB — BASIC METABOLIC PANEL
Anion gap: 9 (ref 5–15)
BUN: 8 mg/dL (ref 8–23)
CO2: 22 mmol/L (ref 22–32)
Calcium: 7.8 mg/dL — ABNORMAL LOW (ref 8.9–10.3)
Chloride: 107 mmol/L (ref 98–111)
Creatinine, Ser: 0.77 mg/dL (ref 0.44–1.00)
GFR, Estimated: >60 mL/min (ref >60)
Glucose, Bld: 83 mg/dL (ref 70–99)
Potassium: 3.2 mmol/L — ABNORMAL LOW (ref 3.5–5.1)
Sodium: 138 mmol/L (ref 135–145)

## 2020-10-15 LAB — CBC
HCT: 26.2 % — ABNORMAL LOW (ref 36.0–46.0)
Hemoglobin: 8.6 g/dL — ABNORMAL LOW (ref 12.0–15.0)
MCH: 30.5 pg (ref 26.0–34.0)
MCHC: 32.8 g/dL (ref 30.0–36.0)
MCV: 92.9 fL (ref 80.0–100.0)
Platelets: 60 10*3/uL — ABNORMAL LOW (ref 150–400)
RBC: 2.82 MIL/uL — ABNORMAL LOW (ref 3.87–5.11)
RDW: 16.8 % — ABNORMAL HIGH (ref 11.5–15.5)
WBC: 16 10*3/uL — ABNORMAL HIGH (ref 4.0–10.5)
nRBC: 0 % (ref 0.0–0.2)

## 2020-10-15 LAB — PROTIME-INR
INR: 1.8 — ABNORMAL HIGH (ref 0.8–1.2)
Prothrombin Time: 20.6 seconds — ABNORMAL HIGH (ref 11.4–15.2)

## 2020-10-15 LAB — GLUCOSE, CAPILLARY
Glucose-Capillary: 111 mg/dL — ABNORMAL HIGH (ref 70–99)
Glucose-Capillary: 123 mg/dL — ABNORMAL HIGH (ref 70–99)
Glucose-Capillary: 135 mg/dL — ABNORMAL HIGH (ref 70–99)
Glucose-Capillary: 142 mg/dL — ABNORMAL HIGH (ref 70–99)
Glucose-Capillary: 83 mg/dL (ref 70–99)
Glucose-Capillary: 96 mg/dL (ref 70–99)

## 2020-10-15 LAB — HEMOGLOBIN AND HEMATOCRIT, BLOOD
HCT: 27.8 % — ABNORMAL LOW (ref 36.0–46.0)
HCT: 27.4 % — ABNORMAL LOW (ref 36.0–46.0)
Hemoglobin: 8.9 g/dL — ABNORMAL LOW (ref 12.0–15.0)
Hemoglobin: 8.9 g/dL — ABNORMAL LOW (ref 12.0–15.0)

## 2020-10-15 MED ORDER — POTASSIUM CHLORIDE 20 MEQ PO PACK: 20.0000 meq | PACK | ORAL | Status: DC

## 2020-10-15 MED ORDER — POTASSIUM CHLORIDE 10 MEQ/50ML IV SOLN
10.0000 meq | INTRAVENOUS | Status: AC
  Administered 2020-10-15 (×4): 10 meq via INTRAVENOUS
  Filled 2020-10-15 (×4): qty 50

## 2020-10-15 MED ORDER — HEPARIN (PORCINE) 25000 UT/250ML-% IV SOLN
550.0000 [IU]/h | INTRAVENOUS | Status: DC
  Administered 2020-10-15: 550 [IU]/h via INTRAVENOUS
  Filled 2020-10-15: qty 250

## 2020-10-15 MED ORDER — POTASSIUM CHLORIDE 20 MEQ PO PACK
20.0000 meq | PACK | ORAL | Status: AC
Start: 2020-10-15 — End: 2020-10-15
  Administered 2020-10-15 (×2): 20 meq via ORAL
  Filled 2020-10-15 (×2): qty 1

## 2020-10-15 NOTE — Progress Notes (Addendum)
Patient had large BM, Maroon in color, Type 6. MD and pharmacy made aware. Orders given to hold Heparin.

## 2020-10-15 NOTE — Evaluation (Signed)
Physical Therapy Evaluation Patient Details Name: Angela [name] MRN: [id number] DOB: 1946-06-30 Today's Date: 10/15/2020   History of Present Illness  75 y.o. female with medical history significant of chronic systolic CHF with AICD/pacemaker placement; rheumatic heart disease s/p MVR; dementia; colon cancer (1993); iliac artery embolus s/p embolectomy; CVA; and afib on Coumadin presented 10/12/20 with lower GI bleeding. Elevated INR given vitamin K; hypotensive, given 2 units PRBC plus fluids; 2/17 embolization via left groin access  Clinical Impression   Pt admitted with above diagnosis. Patient reports ambulating independently with quad cane, however question accuracy. Reports her son is at work during days and she is home alone. Currently required +1 mod assist to stand with strong posterior lean and inability to get her balance over her BOS or safely pivot to chair.  Pt currently with functional limitations due to the deficits listed below (see PT Problem List). Pt will benefit from skilled PT to increase their independence and safety with mobility to allow discharge to the venue listed below.       Follow Up Recommendations SNF;Supervision/Assistance - 24 hour    Equipment Recommendations  Other (comment) (TBD)    Recommendations for Other Services OT consult     Precautions / Restrictions Precautions Precautions: Fall Precaution Comments: denies falls at home      Mobility  Bed Mobility Overal bed mobility: Needs Assistance Bed Mobility: Supine to Sit;Sit to Supine     Supine to sit: Mod assist;HOB elevated Sit to supine: Max assist   General bed mobility comments: bed mobility complicated by multiple lines; pt hesitant and anxious re: lines; assist to move legs and manage torso    Transfers Overall transfer level: Needs assistance Equipment used: 1 person hand held assist Transfers: Sit to/from Stand Sit to Stand: Mod assist         General transfer comment:  assisted from her rt side and from in front; both times with posterior lean/retropulsion that did not improve with cues or marching in place  Ambulation/Gait             General Gait Details: unable +1 assist  Stairs            Wheelchair Mobility    Modified Rankin (Stroke Patients Only)       Balance Overall balance assessment: Needs assistance Sitting-balance support: Bilateral upper extremity supported;Feet unsupported Sitting balance-Leahy Scale: Poor Sitting balance - Comments: left leaning initially; with work came to midline   Standing balance support: Bilateral upper extremity supported Standing balance-Leahy Scale: Zero Standing balance comment: strong posterior lean                             Pertinent Vitals/Pain Pain Assessment: Faces Faces Pain Scale: Hurts a little bit Pain Location: lower abdomen Pain Descriptors / Indicators: Cramping Pain Intervention(s): Limited activity within patient's tolerance;Monitored during session    Home Living Family/patient expects to be discharged to:: Private residence Living Arrangements: Children Available Help at Discharge: Family;Available PRN/intermittently (son works) Type of Home: House Home Access: Stairs to enter Entrance Stairs-Rails: Psychiatric nurse of Steps: 2 Home Layout: One level Home Equipment: Environmental consultant - 2 wheels;Cane - quad;Shower seat;Wheelchair - manual Additional Comments: information based on prior admission    Prior Function Level of Independence: Needs assistance   Gait / Transfers Assistance Needed: gait with cane; assist on stairs           Hand Dominance  Extremity/Trunk Assessment   Upper Extremity Assessment Upper Extremity Assessment: Generalized weakness    Lower Extremity Assessment Lower Extremity Assessment: Generalized weakness (limited left ankle DF (pt reports previously broken))    Cervical / Trunk Assessment Cervical  / Trunk Assessment: Kyphotic  Communication   Communication: No difficulties  Cognition Arousal/Alertness: Awake/alert Behavior During Therapy: Anxious Overall Cognitive Status: No family/caregiver present to determine baseline cognitive functioning                                 General Comments: following commands with delay      General Comments General comments (skin integrity, edema, etc.): pt expresses fear of falling as pushing posteriorly    Exercises General Exercises - Lower Extremity Ankle Circles/Pumps: AROM;10 reps   Assessment/Plan    PT Assessment Patient needs continued PT services  PT Problem List Decreased strength;Decreased range of motion;Decreased activity tolerance;Decreased balance;Decreased mobility;Decreased cognition;Decreased knowledge of use of DME;Decreased knowledge of precautions;Pain       PT Treatment Interventions DME instruction;Gait training;Stair training;Functional mobility training;Therapeutic activities;Therapeutic exercise;Balance training;Cognitive remediation;Patient/family education    PT Goals (Current goals can be found in the Care Plan section)  Acute Rehab PT Goals Patient Stated Goal: get moving better so she can return home PT Goal Formulation: With patient Time For Goal Achievement: 10/29/20 Potential to Achieve Goals: Fair    Frequency Min 3X/week (until discharge plan confirmed)   Barriers to discharge Decreased caregiver support      Co-evaluation               AM-PAC PT "6 Clicks" Mobility  Outcome Measure Help needed turning from your back to your side while in a flat bed without using bedrails?: A Lot Help needed moving from lying on your back to sitting on the side of a flat bed without using bedrails?: A Lot Help needed moving to and from a bed to a chair (including a wheelchair)?: Total Help needed standing up from a chair using your arms (e.g., wheelchair or bedside chair)?: Total Help  needed to walk in hospital room?: Total Help needed climbing 3-5 steps with a railing? : Total 6 Click Score: 8    End of Session Equipment Utilized During Treatment: Gait belt Activity Tolerance: Patient limited by fatigue Patient left: in bed;with call bell/phone within reach;with bed alarm set Nurse Communication: Mobility status PT Visit Diagnosis: Unsteadiness on feet (R26.81);Muscle weakness (generalized) (M62.81);Difficulty in walking, not elsewhere classified (R26.2)    Time: 3845-3646 PT Time Calculation (min) (ACUTE ONLY): 38 min   Charges:   PT Evaluation $PT Eval Moderate Complexity: 1 Mod PT Treatments $Therapeutic Activity: 23-37 mins         Arby Barrette, PT Pager [phone]   Rexanne Mano 10/15/2020, 1:50 PM

## 2020-10-15 NOTE — Progress Notes (Signed)
ANTICOAGULATION CONSULT NOTE - Initial Consult  Pharmacy Consult for IV Heparin Indication: atrial fibrillation and Mechanical valve  No Known Allergies  Patient Measurements: Height: 5\' 6"  (167.6 cm) Weight: 59.7 kg (131 lb 9.8 oz) IBW/kg (Calculated) : 59.3 Heparin Dosing Weight: 59.3 kg  Vital Signs: Temp: 99.2 F (37.3 C) (02/20 0408) Temp Source: Oral (02/20 0408) BP: 92/44 (02/20 0900) Pulse Rate: 74 (02/20 0900)  Labs: Recent Labs    10/13/20 0436 10/13/20 1723 10/14/20 0411 10/14/20 1257 10/15/20 0430  HGB 7.4*   < > 7.7* 8.9* 8.6*  HCT 23.1*   < > 23.2* 26.7* 26.2*  PLT 67*  --  54*  --  60*  LABPROT 18.8*  --  22.0*  --  20.6*  INR 1.6*  --  2.0*  --  1.8*  CREATININE 0.89  --  0.83  --  0.77   < > = values in this interval not displayed.    Estimated Creatinine Clearance: 56 mL/min (by C-G formula based on SCr of 0.77 mg/dL).   Medical History: Past Medical History:  Diagnosis Date  . Abnormality of gait 08/23/2014  . Arthritis   . Atrial fibrillation ([place])    A.  Chronic Coumadin  . Cardiac arrest - ventricular fibrillation    A.  12/2007;  B. 01/2008 St. Jude Promote Bi-V ICD placed  . CVA (cerebral vascular accident) ([place])   . Diverticular disease   . DVT (deep venous thrombosis) ([place])   . Embolus and thrombosis of iliac artery ([place])    A.  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 History of colon cancer    A.  1999 - T3, N1  chemotherapy  . Internal hemorrhoids   . Memory deficit 08/23/2014  . Nonischemic cardiomyopathy ([place])    a.  11/2001 - Cath - NL Cors; b. ECHO 09/22/11: EF 20%, MVR normal, moderate LAE, mild RAE c. ECHO (08/2012): ED 20%, diff HK, LA mod dilated, mild/mod TR, RV mild/mod decreased sys fx  . Rectal bleeding 04/28/2017  . Rheumatic heart disease    A. 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 Sick sinus syndrome ([place])    A.  s/p pacer in 1995.  B.    . Systolic CHF, chronic ([place])    A.  01/2008 Echo - EF 10-20%  . UTI  (urinary tract infection) 01/19/2019    Assessment: 75 year old female on Warfarin prior to admission for mechanical valve and atrial fibrillation admitted 2/17 with lower GIB. Now patient is status post embolization without recurrent bleeding. Pharmacy consulted to resume IV Heparin 2/19 (continuing to hold Warfarin per GI recommendations at this time). 2/19 Patient developed bright red blood per rectum and IV Heparin was again stopped. Bleeding has slowed (RN states dark stool this AM of old blodd) and with patient having mechanical valve, pharmacy has been consulted to restart IV Heparin again.   Hgb 8.6, Plts up to 60. INR 1.8 today.  Discussed starting dose with Dr. Halford Chessman - will start at lower dose and continue to monitor.   Goal of Therapy:  Heparin level 0.3-0.7 units/ml Monitor platelets by anticoagulation protocol: Yes   Plan:  Restart IV Heparin (no bolus) at 550 units/hr.  Heparin level in 6-8 hours.  Daily Heparin level, CBC, and INR.  Follow-up plans to resume Warfarin.   Sloan Leiter, PharmD, BCPS, BCCCP Clinical Pharmacist Please refer to [facility] for [facility] numbers 10/15/2020,10:03 AM

## 2020-10-15 NOTE — Progress Notes (Signed)
[facility] ADULT ICU REPLACEMENT PROTOCOL   The patient does apply for the [facility] Adult ICU Electrolyte Replacment Protocol based on the criteria listed below:   1. Is GFR >/= 30 ml/min? Yes.    Patient's GFR today is >60 2. Is SCr </= 2? Yes.   Patient's SCr is 0.77 ml/kg/hr 3. Did SCr increase >/= 0.5 in 24 hours? No. 4. Abnormal electrolyte(s): K+3.2 5. Ordered repletion with: protocol 6. If a panic level lab has been reported, has the CCM MD in charge been notified? Yes.  .   Physician:  Dr.Sommer  Carlisle Beers 10/15/2020 5:45 AM

## 2020-10-15 NOTE — Progress Notes (Signed)
NAME:  Angela [name], MRN:  [id number], DOB:  02-13-1946, LOS: 3 ADMISSION DATE:  10/12/2020, CONSULTATION DATE:  10/12/2020 REFERRING MD:  Lorin Mercy, CHIEF COMPLAINT:  Rectal bleeding  Brief History:  75 yo female presented with weakness, chest pain and rectal bleeding.  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  Seen by GI.  IR consulted & patient underwent embolization for bleeding in distal colon.  Transferred to ICU due to persistent hypotension.   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 To IR for embolization.  2/18 Hgb 7.4, one bloody BM overnight  2/19 transfuse PRBC 2/20 start heparin gtt  Consults:  GI- [place] IR  Procedures:    Significant Diagnostic Tests:   CTA Abd / pelvis 2/17 >> positive for GIB involving the descending colon, presumably diverticular disease  Limited ECHO 2/17 >> LVEF <20%, LV with severely decreased function with global hypokinesis & severely dilated, LA severely dilated, RA severely dilated, mechanical mitral valve (similar ECHO to prior)  CT abd/pelvis 2/19 >> nodularity of liver ?cirrhosis, cholelithiasis, marked hydronephrosis mildly progressed since 2020, Lt colon diverticulosis, small Rt groin hernia, atherosclerosis  Micro Data:  COVID 2/17 >> negative Flu 2/17 >> negative  Antimicrobials:     Interim History / Subjective:  Mild abdominal discomfort.  BP with blood clots and dark blood.  Objective   Blood pressure (!) 92/44, pulse 74, temperature 99.2 F (37.3 C), temperature source Oral, resp. rate (!) 21, height  5\' 6"  (1.676 m), weight 59.7 kg, SpO2 97 %.        Intake/Output Summary (Last 24 hours) at 10/15/2020 1003 Last data filed at 10/15/2020 0900 Gross per 24 hour  Intake 959.7 ml  Output 2035 ml  Net -1075.3 ml   Filed Weights   10/13/20 0440 10/14/20 0414 10/15/20 0437  Weight: 60.7 kg 59.3 kg 59.7 kg   Examination:  General - alert Eyes - pupils reactive ENT - no sinus tenderness, no stridor Cardiac - regular rate/rhythm, 2/6 murmur Chest - equal breath sounds b/l, no wheezing or rales Abdomen - soft, non tender, + bowel sounds Extremities - no cyanosis, clubbing, or edema Skin - no rashes Neuro - normal strength, moves extremities, follows commands Psych - normal mood and behavior  Resolved Hospital Problem list   AKI from ATN 2nd to hypotension, Hypomagnesemia, Nausea with vomiting, Hyperglycemia from acute stress  Assessment & Plan:   Diverticular bleeding with hemorrhagic shock. Possible changes of cirrhosis on CT abd. - s/p embolization by IR on 2/17 - still having blood clots and dark blood in stool - GI following - repeat CT abd/pelvis 2/19 unrevealing - wean pressors to keep MAP > 65  Acute blood loss anemia. - goal Hb > 8 given cardiac hx - f/u CBC  Hx of A fib, mechanical MVR. - concern for CVA w/o anticoagulation in setting of mechanical heart valve - will resume heparin gtt and monitor for bleeding in ICU  Hx of non ischemic CM with chronic systolic CHF, SSS, VF s/p ICD. - continue amiodarone, digoxin  Chronic hypotension. - continue midodrine  Thrombocytopenia. -  likely from consumption - f/u CBC  Hx of mild dementia, chronic pain, anxiety with chronic benzo use. - prn xanax, morphine - delirium precautions   Hypokalemia. Chronic Rt hydronephrosis. - monitor renal fx - f/u BMET  Best practice (evaluated daily)  Diet: regular DVT prophylaxis: heparin gtt GI prophylaxis: protonix Mobility: as tolerated Disposition: ICU  Goals of  Care:  Last date of multidisciplinary goals of care discussion: Family and staff present:  Summary of discussion:  Follow up goals of care discussion due:  Code Status: Full.    Labs    CMP Latest Ref Rng & Units 10/15/2020 10/14/2020 10/13/2020  Glucose 70 - 99 mg/dL 83 123(H) 104(H)  BUN 8 - 23 mg/dL 8 12 23   Creatinine 0.44 - 1.00 mg/dL 0.77 0.83 0.89  Sodium 135 - 145 mmol/L 138 137 139  Potassium 3.5 - 5.1 mmol/L 3.2(L) 3.5 4.1  Chloride 98 - 111 mmol/L 107 110 111  CO2 22 - 32 mmol/L 22 22 21(L)  Calcium 8.9 - 10.3 mg/dL 7.8(L) 7.5(L) 7.2(L)  Total Protein 6.5 - 8.1 g/dL - - -  Total Bilirubin 0.3 - 1.2 mg/dL - - -  Alkaline Phos 38 - 126 U/L - - -  AST 15 - 41 U/L - - -  ALT 0 - 44 U/L - - -    CBC Latest Ref Rng & Units 10/15/2020 10/14/2020 10/14/2020  WBC 4.0 - 10.5 K/uL 16.0(H) - 22.7(H)  Hemoglobin 12.0 - 15.0 g/dL 8.6(L) 8.9(L) 7.7(L)  Hematocrit 36.0 - 46.0 % 26.2(L) 26.7(L) 23.2(L)  Platelets 150 - 400 K/uL 60(L) - 54(L)    Critical care time: 32 minutes  Chesley Mires, MD [place] Pager - [phone] 10/15/2020, 10:03 AM

## 2020-10-15 NOTE — Progress Notes (Addendum)
Patient with medium size maroon color bowel movement, Type 6. MD aware.

## 2020-10-15 NOTE — Progress Notes (Addendum)
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 Admitted with hematochezia   # Hematochezia, CTA positive for bleeding in descending colon. Presumed diverticular bleed. On 2/17 she had embolization to small distal branch of left colic vessel.  --Yesterday patient passed a small amount of dark red blood with BM. This am, per RN she passed a moderate amount of maroon blood and just now patient is telling me she has to have a BM. Bedpan placed. RN will let me know if BM contains blood and how much. She is on IV heparin.  --Last unit of blood was given yesterday morning. Hgb stable overnight 8.9 >> 8.6 this am but blood draw was prior to episode of bleeding  # Hypotension, on neosynephrine, midodrine SBP fluctuating between 60's and low 100's.   # Persistent intermittent lower abdominal discomfort despite QID Bentyl.  No acute findings on CT scan yesterday. UA on 2/18 contained 11-20 WBCs, many bacteria, ? UTI  # Possible cirrhosis by CT scan     Attending Physician Note   I have taken an interval history, reviewed the chart and examined the patient. I agree with the Advanced Practitioner's note, impression and recommendations.   Recurrent maroon stool today after IV heparin was restarted for mechanical MVR. Abdominal pain has improved but it persists. Appetite improved. Possible UTI. Blood pressures soft. If bleeding recurs reconsult IR. Trend CBC.   Lucio Edward, MD FACG [phone]       SUBJECTIVE:   Still having intermittent lower abdominal discomfort. Eating lunch.     OBJECTIVE:    Scheduled inpatient medications:  . sodium chloride   Intravenous Once  . amiodarone  200 mg Oral Daily  . Chlorhexidine Gluconate Cloth  6 each  Topical Daily  . dicyclomine  10 mg Oral TID AC & HS  . digoxin  125 mcg Oral Daily  . feeding supplement  1 Container Oral TID BM  . midodrine  10 mg Oral TID WC  . pantoprazole  40 mg Oral BID  . sodium chloride flush  10-40 mL Intracatheter Q12H   Continuous inpatient infusions:  . sodium chloride Stopped (10/15/20 1031)  . heparin 550 Units/hr (10/15/20 1100)  . phenylephrine (NEO-SYNEPHRINE) Adult infusion 25 mcg/min (10/15/20 1100)   PRN inpatient medications: acetaminophen **OR** acetaminophen, ALPRAZolam, morphine injection, ondansetron **OR** ondansetron (ZOFRAN) IV, polyethylene glycol, sodium chloride flush  Vital signs in last 24 hours: Temp:  [97.7 F (36.5 C)-99.8 F (37.7 C)] 99.2 F (37.3 C) (02/20 0408) Pulse Rate:  [70-76] 73 (02/20 1030) Resp:  [10-28] 18 (02/20 1030) BP: (53-119)/(37-68) 102/59 (02/20 1030) SpO2:  [95 %-100 %] 100 % (02/20 1030) Weight:  [59.7 kg] 59.7 kg (02/20 0437) Last BM Date: 10/13/20  Intake/Output Summary (Last 24 hours) at 10/15/2020 1148 Last data filed at 10/15/2020 1100 Gross per 24 hour  Intake 1111.58 ml  Output 2035 ml  Net -923.42 ml     Physical Exam:  General: Alert female in NAD Heart:  Regular rate and rhythm. No lower extremity edema Pulmonary: Normal respiratory effort Abdomen: Soft, nondistended, mild diffuse lower abdominal tenderness.  Normal bowel sounds.  Neurologic: Alert and oriented Psych: Pleasant. Cooperative.   [place] [employer]   10/13/20 0440 10/14/20 0414  10/15/20 0437  Weight: 60.7 kg 59.3 kg 59.7 kg    Intake/Output from previous day: 02/19 0701 - 02/20 0700 In: 1032.3 [I.V.:690.3; Blood:315; IV Piggyback:27.1] Out: 1810 [Urine:1810] Intake/Output this shift: Total I/O In: 434.2 [P.O.:120; I.V.:167.7; IV Piggyback:146.5] Out: 225 [Urine:225]    Lab Results: Recent Labs    10/13/20 0436 10/13/20 1723 10/14/20 0411 10/14/20 1257 10/15/20 0430  WBC 13.5*  --  22.7*  --  16.0*  HGB  7.4*   < > 7.7* 8.9* 8.6*  HCT 23.1*   < > 23.2* 26.7* 26.2*  PLT 67*  --  54*  --  60*   < > = values in this interval not displayed.   BMET Recent Labs    10/13/20 0436 10/14/20 0411 10/15/20 0430  NA 139 137 138  K 4.1 3.5 3.2*  CL 111 110 107  CO2 21* 22 22  GLUCOSE 104* 123* 83  BUN 23 12 8   CREATININE 0.89 0.83 0.77  CALCIUM 7.2* 7.5* 7.8*   LFT No results for input(s): PROT, ALBUMIN, AST, ALT, ALKPHOS, BILITOT, BILIDIR, IBILI in the last 72 hours. PT/INR Recent Labs    10/14/20 0411 10/15/20 0430  LABPROT 22.0* 20.6*  INR 2.0* 1.8*   Hepatitis Panel No results for input(s): HEPBSAG, HCVAB, HEPAIGM, HEPBIGM in the last 72 hours.  CT ABDOMEN PELVIS W CONTRAST  Result Date: 10/14/2020 CLINICAL DATA:  Abdominal pain.  Nonlocalized. EXAM: CT ABDOMEN AND PELVIS WITH CONTRAST TECHNIQUE: Multidetector CT imaging of the abdomen and pelvis was performed using the standard protocol following bolus administration of intravenous contrast. CONTRAST:  66mL OMNIPAQUE IOHEXOL 300 MG/ML  SOLN COMPARISON:  CTA abdomen/pelvis 10/12/2020 FINDINGS: Lower chest: Chest heart is markedly enlarged. Basilar atelectasis noted bilaterally with small left pleural effusion. Hepatobiliary: Subtle nodularity of liver contour raises the question of cirrhosis. Noncalcified stones are visualized in the gallbladder, superimposed on vicarious excretion of contrast. No intrahepatic or extrahepatic biliary dilation. Pancreas: No focal mass lesion. No dilatation of the main duct. No intraparenchymal cyst. No peripancreatic edema. Spleen: No splenomegaly. No focal mass lesion. Adrenals/Urinary Tract: No adrenal nodule or mass. Marked right hydronephrosis is stable since prior but progressive when comparing to 11/26/2018. Nonobstructing renal stone noted posterior calyx, better seen on noncontrast CT from 2 days ago. Overlying cortical thinning is compatible with chronicity. Mild fullness of the left intrarenal  collecting system is similar. Areas of cortical scarring noted in the left kidney. No evidence for hydroureter. Urinary bladder is decompressed by Foley catheter. Stomach/Bowel: Stomach is unremarkable. No gastric wall thickening. No evidence of outlet obstruction. Duodenum is normally positioned as is the ligament of Treitz. No small bowel wall thickening. No small bowel dilatation. The appendix is not well visualized, but there is no edema or inflammation in the region of the cecum. No gross colonic mass. No colonic wall thickening. Diverticular changes are noted in the left colon without evidence of diverticulitis. Wall thickening in the mid sigmoid colon likely related to muscular hypertrophy. Vascular/Lymphatic: There is abdominal aortic atherosclerosis without aneurysm. There is no gastrohepatic or hepatoduodenal ligament lymphadenopathy. No retroperitoneal or mesenteric lymphadenopathy. Portal vein and superior mesenteric vein are patent celiac axis and SMA are opacified. IMA is opacified. No pelvic sidewall lymphadenopathy. Reproductive: The uterus is surgically absent. There is no adnexal mass. Other: No intraperitoneal free fluid. Musculoskeletal: Small right groin hernia contains only fat. Diffuse body wall edema noted lower abdomen and pelvis. No worrisome lytic or sclerotic osseous abnormality. Degenerative changes noted lumbar  spine. IMPRESSION: 1. No acute findings in the abdomen or pelvis. No bowel wall thickening. No interloop mesenteric or free intraperitoneal fluid. 2. Subtle nodularity of liver contour raises the question of cirrhosis. 3. Cholelithiasis. 4. Marked right hydronephrosis, similar to 10/12/2020 but mildly progressive compared to 11/26/2018. Overlying cortical thinning compatible with chronic process. No associated hydroureter suggests UPJ obstruction. 5. Nonobstructive right nephrolithiasis. 6. Left colonic diverticulosis without diverticulitis. 7. Small right groin hernia contains  only fat. 8. Aortic Atherosclerosis (ICD10-I70.0). Electronically Signed   By: Misty Stanley M.D.   On: 10/14/2020 19:39    Principal Problem:   Acute lower GI hemorrhage Active Problems:   Atrial fibrillation (HCC)   Chronic combined systolic and diastolic heart failure (HCC)   Memory deficit   ICD (implantable cardioverter-defibrillator), biventricular, in situ   Hemorrhagic shock (HCC)   GIB (gastrointestinal bleeding)   Lower abdominal pain     LOS: 3 days   Tye Savoy ,NP 10/15/2020, 11:48 AM

## 2020-10-15 NOTE — Progress Notes (Signed)
Notified Dr. Doy [name] answering service that   patient had bloody bowel movement. Spoke to [place] and left my number [phone].

## 2020-10-16 DIAGNOSIS — R578 Other shock: Secondary | ICD-10-CM | POA: Diagnosis not present

## 2020-10-16 DIAGNOSIS — R413 Other amnesia: Secondary | ICD-10-CM | POA: Diagnosis not present

## 2020-10-16 DIAGNOSIS — R1030 Lower abdominal pain, unspecified: Secondary | ICD-10-CM

## 2020-10-16 DIAGNOSIS — I482 Chronic atrial fibrillation, unspecified: Secondary | ICD-10-CM | POA: Diagnosis not present

## 2020-10-16 DIAGNOSIS — K922 Gastrointestinal hemorrhage, unspecified: Secondary | ICD-10-CM | POA: Diagnosis not present

## 2020-10-16 LAB — COMPREHENSIVE METABOLIC PANEL
ALT: 15 U/L (ref 0–44)
AST: 14 U/L — ABNORMAL LOW (ref 15–41)
Albumin: 2.2 g/dL — ABNORMAL LOW (ref 3.5–5.0)
Alkaline Phosphatase: 38 U/L (ref 38–126)
Anion gap: 7 (ref 5–15)
BUN: 8 mg/dL (ref 8–23)
CO2: 21 mmol/L — ABNORMAL LOW (ref 22–32)
Calcium: 7.6 mg/dL — ABNORMAL LOW (ref 8.9–10.3)
Chloride: 108 mmol/L (ref 98–111)
Creatinine, Ser: 0.79 mg/dL (ref 0.44–1.00)
GFR, Estimated: >60 mL/min (ref >60)
Glucose, Bld: 96 mg/dL (ref 70–99)
Potassium: 3.7 mmol/L (ref 3.5–5.1)
Sodium: 136 mmol/L (ref 135–145)
Total Bilirubin: 1 mg/dL (ref 0.3–1.2)
Total Protein: 4.5 g/dL — ABNORMAL LOW (ref 6.5–8.1)

## 2020-10-16 LAB — CBC
HCT: 26 % — ABNORMAL LOW (ref 36.0–46.0)
Hemoglobin: 8.7 g/dL — ABNORMAL LOW (ref 12.0–15.0)
MCH: 31.5 pg (ref 26.0–34.0)
MCHC: 33.5 g/dL (ref 30.0–36.0)
MCV: 94.2 fL (ref 80.0–100.0)
Platelets: 80 10*3/uL — ABNORMAL LOW (ref 150–400)
RBC: 2.76 MIL/uL — ABNORMAL LOW (ref 3.87–5.11)
RDW: 17 % — ABNORMAL HIGH (ref 11.5–15.5)
WBC: 10.5 10*3/uL (ref 4.0–10.5)
nRBC: 0.2 % (ref 0.0–0.2)

## 2020-10-16 LAB — HEPARIN LEVEL (UNFRACTIONATED): Heparin Unfractionated: <0.1 IU/mL — ABNORMAL LOW (ref 0.30–0.70)

## 2020-10-16 LAB — GLUCOSE, CAPILLARY
Glucose-Capillary: 99 mg/dL (ref 70–99)
Glucose-Capillary: 97 mg/dL (ref 70–99)
Glucose-Capillary: 90 mg/dL (ref 70–99)
Glucose-Capillary: 132 mg/dL — ABNORMAL HIGH (ref 70–99)

## 2020-10-16 LAB — MAGNESIUM: Magnesium: 1.5 mg/dL — ABNORMAL LOW (ref 1.7–2.4)

## 2020-10-16 LAB — PROTIME-INR
INR: 1.6 — ABNORMAL HIGH (ref 0.8–1.2)
Prothrombin Time: 18.8 seconds — ABNORMAL HIGH (ref 11.4–15.2)

## 2020-10-16 LAB — CORTISOL: Cortisol, Plasma: 21.7 ug/dL

## 2020-10-16 MED ORDER — MAGNESIUM SULFATE 2 GM/50ML IV SOLN
2.0000 g | Freq: Once | INTRAVENOUS | Status: AC
  Administered 2020-10-16: 2 g via INTRAVENOUS
  Filled 2020-10-16: qty 50

## 2020-10-16 MED ORDER — HEPARIN (PORCINE) 25000 UT/250ML-% IV SOLN
900.0000 [IU]/h | INTRAVENOUS | Status: DC
  Administered 2020-10-16: 550 [IU]/h via INTRAVENOUS
  Administered 2020-10-17: 750 [IU]/h via INTRAVENOUS
  Administered 2020-10-19 – 2020-10-20 (×2): 900 [IU]/h via INTRAVENOUS
  Filled 2020-10-16 (×4): qty 250

## 2020-10-16 MED ORDER — MELATONIN 3 MG PO TABS
3.0000 mg | ORAL_TABLET | Freq: Every evening | ORAL | Status: DC | PRN
  Administered 2020-10-16 – 2020-10-24 (×6): 3 mg via ORAL
  Filled 2020-10-16 (×7): qty 1

## 2020-10-16 MED ORDER — HYDROCORTISONE NA SUCCINATE PF 100 MG IJ SOLR
50.0000 mg | Freq: Four times a day (QID) | INTRAMUSCULAR | Status: DC
  Administered 2020-10-16: 50 mg via INTRAVENOUS
  Filled 2020-10-16: qty 2

## 2020-10-16 NOTE — Evaluation (Addendum)
Occupational Therapy Evaluation Patient Details Name: Angela [name] MRN: [id number] DOB: October 06, 1945 Today's Date: 10/16/2020    History of Present Illness 75 y.o. female with medical history significant of chronic systolic CHF with AICD/pacemaker placement; rheumatic heart disease s/p MVR; dementia; colon cancer (1993); iliac artery embolus s/p embolectomy; CVA; and afib on Coumadin presented 10/12/20 with lower GI bleeding. Elevated INR given vitamin K; hypotensive, given 2 units PRBC plus fluids; 2/17 embolization via left groin access   Clinical Impression   Pt PTA: Pt was living with son and reports independence with ADL and mobility with quad cane. Pt currently, limited by decreased strength, decreased ability to follow commands and decreased activity tolerance. Pt set-upA to maxA for ADL and modA to modA+2 for bed mobility and transfers. Pt continues to have posterior lean. Cognition is slightly impaired or pt is hard of hearing. Pt would benefit from continued OT skilled services. OT following acutely. Pt O2 levels >90% and HR 70-80 throughout exertion. Pt 106/57, 77 BPM, 99% O2  in supine at rest; sitting: 110/60, 99% O2 74 BPM; sitting in recliner 110/68, 94% O2, 70 BPM after exertion. ** hope pt can return home with [facility], but given that pt is home alone mostly during the day, hope that son/family can provide nearly 24/7 for a week or so when pt discharges.    Follow Up Recommendations  SNF;Home health OT;Supervision/Assistance - 24 hour (SNF vs HH)    Equipment Recommendations  3 in 1 bedside commode    Recommendations for Other Services       Precautions / Restrictions Precautions Precautions: Fall Precaution Comments: denies falls at home Restrictions Weight Bearing Restrictions: No      Mobility Bed Mobility Overal bed mobility: Needs Assistance Bed Mobility: Supine to Sit;Sit to Supine     Supine to sit: Mod assist;HOB elevated Sit to supine: Max assist   General bed  mobility comments: bed mobility complicated by multiple lines; pt hesitant and anxious re: lines; assist to move legs and manage torso    Transfers Overall transfer level: Needs assistance Equipment used: 1 person hand held assist Transfers: Sit to/from Stand;Stand Pivot Transfers Sit to Stand: Mod assist Stand pivot transfers: Mod assist;From elevated surface       General transfer comment: Pt sit to stand with hands on bed for power up, modA to assist with retropulsion and posterior lean; pt modA for transfer taking short steps `1 to R holding into OTR in front to recliner    Balance Overall balance assessment: Needs assistance Sitting-balance support: Bilateral upper extremity supported;Feet unsupported Sitting balance-Leahy Scale: Poor Sitting balance - Comments: left leaning initially; with work came to midline   Standing balance support: Bilateral upper extremity supported Standing balance-Leahy Scale: Zero Standing balance comment: strong posterior lean               High Level Balance Comments: Assist from front           ADL either performed or assessed with clinical judgement   ADL Overall ADL's : Needs assistance/impaired Eating/Feeding: Set up   Grooming: Minimal assistance;Sitting   Upper Body Bathing: Minimal assistance;Sitting   Lower Body Bathing: Maximal assistance;Sitting/lateral leans;Sit to/from stand   Upper Body Dressing : Minimal assistance;Sitting   Lower Body Dressing: Maximal assistance;Sitting/lateral leans;Bed level;Sit to/from stand   Toilet Transfer: Moderate assistance;Stand-pivot Toilet Transfer Details (indicate cue type and reason): posterior lean Toileting- Clothing Manipulation and Hygiene: Moderate assistance;Cueing for safety;Cueing for sequencing;Sitting/lateral lean;Sit to/from stand;+2 for physical  assistance;+2 for safety/equipment Toileting - Clothing Manipulation Details (indicate cue type and reason): posterior lean  would need +2     Functional mobility during ADLs: Moderate assistance;+2 for physical assistance;+2 for safety/equipment;Cueing for safety;Cueing for sequencing (would do better with RW) General ADL Comments: Pt limited by decreased strength, decreased ability to follow commands and decreased activity tolerance. Pt O2 levels >90% and HR 70-80 throughout exertion. Pt 106/57, 77 BPM, 99% O2  in supine at rest; sitting: 110/60, 99% O2 74 BPM; sitting in recliner 110/68, 94% O2, 70 BPM after exertion.     Vision Baseline Vision/History: No visual deficits Patient Visual Report: No change from baseline Vision Assessment?: No apparent visual deficits     Perception     Praxis      Pertinent Vitals/Pain Pain Assessment: Faces Pain Score: 0-No pain Faces Pain Scale: No hurt Pain Location: lower abdomen Pain Intervention(s): Monitored during session     Hand Dominance Right   Extremity/Trunk Assessment Upper Extremity Assessment Upper Extremity Assessment: Generalized weakness;RUE deficits/detail;LUE deficits/detail RUE Deficits / Details: Weakness, tremor RUE Coordination: decreased fine motor;decreased gross motor LUE Deficits / Details: weakness, tremor LUE Coordination: decreased fine motor;decreased gross motor   Lower Extremity Assessment Lower Extremity Assessment: Generalized weakness;Defer to PT evaluation   Cervical / Trunk Assessment Cervical / Trunk Assessment: Kyphotic   Communication Communication Communication: No difficulties   Cognition Arousal/Alertness: Awake/alert Behavior During Therapy: WFL for tasks assessed/performed;Flat affect Overall Cognitive Status: No family/caregiver present to determine baseline cognitive functioning                                 General Comments: Pt appears to be HOH   General Comments  Pt O2 levels >90% and HR 70-80 throughout exertion. Pt 106/57, 77 BPM, 99% O2  in supine at rest; sitting: 110/60, 99% O2 74  BPM; sitting in recliner 110/68, 94% O2, 70 BPM after exertion.    Exercises     Shoulder Instructions      Home Living Family/patient expects to be discharged to:: Private residence Living Arrangements: Children Available Help at Discharge: Family;Available PRN/intermittently (son works) Type of Home: House Home Access: Stairs to enter Technical brewer of Steps: 2 Entrance Stairs-Rails: [place]: One level     Bathroom Shower/Tub: Teacher, early years/pre: Standard Bathroom Accessibility: No   Home Equipment: Environmental consultant - 2 wheels;Cane - quad;Shower seat;Wheelchair - manual   Additional Comments: information based on prior admission      Prior Functioning/Environment Level of Independence: Needs assistance  Gait / Transfers Assistance Needed: gait with cane; assist on stairs ADL's / Homemaking Assistance Needed: "I did everything on my own."            OT Problem List: Decreased strength;Decreased activity tolerance;Impaired balance (sitting and/or standing);Decreased cognition;Decreased safety awareness;Impaired UE functional use;Decreased coordination      OT Treatment/Interventions: Self-care/ADL training;Therapeutic exercise;Neuromuscular education;Energy conservation;DME and/or AE instruction;Therapeutic activities;Cognitive remediation/compensation;Patient/family education;Balance training    OT Goals(Current goals can be found in the care plan section) Acute Rehab OT Goals Patient Stated Goal: get moving better so she can return home OT Goal Formulation: With patient Time For Goal Achievement: 11/01/20 Potential to Achieve Goals: Good  OT Frequency: Min 2X/week   Barriers to D/C:            Co-evaluation              AM-PAC OT "6  Clicks" Daily Activity     Outcome Measure Help from another person eating meals?: A Little Help from another person taking care of personal grooming?: A Lot Help from another person  toileting, which includes using toliet, bedpan, or urinal?: A Lot Help from another person bathing (including washing, rinsing, drying)?: A Lot Help from another person to put on and taking off regular upper body clothing?: A Lot Help from another person to put on and taking off regular lower body clothing?: A Lot 6 Click Score: 13   End of Session Equipment Utilized During Treatment: Gait belt Nurse Communication: Mobility status  Activity Tolerance: Patient tolerated treatment well Patient left: in chair;with call bell/phone within reach;with chair alarm set  OT Visit Diagnosis: Unsteadiness on feet (R26.81);Muscle weakness (generalized) (M62.81);Other symptoms and signs involving cognitive function                Time: 9150-5697 OT Time Calculation (min): 32 min Charges:  OT General Charges $OT Visit: 1 Visit OT Evaluation $OT Eval Moderate Complexity: 1 Mod OT Treatments $Self Care/Home Management : 8-22 mins  Jefferey Pica, OTR/L Acute Rehabilitation Services Pager: [phone] Office: [phone]   ALLISON C 10/16/2020, 10:05 AM

## 2020-10-16 NOTE — Progress Notes (Signed)
Daily Rounding Note  10/16/2020, 12:02 PM  LOS: 4 days   SUBJECTIVE:   Chief complaint:  Diverticular bleed.  Anemia.    Still c/o nagging pain in low abdomen, pelvis area.  Yesterday had 2 maroon/burgundy stools, 1 smaller, the next larger volume.  No BMs since but RN reports wiping very red blood from the rectal area later in the day. Required  OBJECTIVE:         Vital signs in last 24 hours:    Temp:  [98.2 F (36.8 C)-99.6 F (37.6 C)] 98.9 F (37.2 C) (02/21 1137) Pulse Rate:  [71-75] 73 (02/21 1137) Resp:  [14-27] 16 (02/21 1137) BP: (84-117)/(48-91) 91/50 (02/21 1137) SpO2:  [95 %-100 %] 97 % (02/21 1137) Weight:  [59.2 kg] 59.2 kg (02/21 0457) Last BM Date: 10/15/20 Filed Weights   10/14/20 0414 10/15/20 0437 10/16/20 0457  Weight: 59.3 kg 59.7 kg 59.2 kg   General: Frail but comfortable, alert. Heart: RRR with mechanical valve. Chest: No labored breathing, no cough.  Lungs clear. Abdomen: Soft.  Slight left-sided and suprapubic tenderness.  No guarding or rebound.  Bowel sounds active.  No distention Extremities: No CCE Neuro/Psych: Appropriate.  Follows all commands.  Moves all 4 limbs.  Intake/Output from previous day: 02/20 0701 - 02/21 0700 In: 2279.1 [P.O.:600; I.V.:1256.2; IV Piggyback:172.9] Out: 1030 [Urine:1030]  Intake/Output this shift: Total I/O In: 310.1 [P.O.:120; I.V.:140; IV Piggyback:50.1] Out: 570 [Urine:570]  Lab Results: Recent Labs    10/14/20 0411 10/14/20 1257 10/15/20 0430 10/15/20 1556 10/15/20 2017 10/16/20 0458  WBC 22.7*  --  16.0*  --   --  10.5  HGB 7.7*   < > 8.6* 8.9* 8.9* 8.7*  HCT 23.2*   < > 26.2* 27.8* 27.4* 26.0*  PLT 54*  --  60*  --   --  80*   < > = values in this interval not displayed.   BMET Recent Labs    10/14/20 0411 10/15/20 0430 10/16/20 0458  NA 137 138 136  K 3.5 3.2* 3.7  CL 110 107 108  CO2 22 22 21*  GLUCOSE 123* 83 96  BUN  12 8 8   CREATININE 0.83 0.77 0.79  CALCIUM 7.5* 7.8* 7.6*   LFT Recent Labs    10/16/20 0458  PROT 4.5*  ALBUMIN 2.2*  AST 14*  ALT 15  ALKPHOS 38  BILITOT 1.0   PT/INR Recent Labs    10/15/20 0430 10/16/20 0458  LABPROT 20.6* 18.8*  INR 1.8* 1.6*   Hepatitis Panel No results for input(s): HEPBSAG, HCVAB, HEPAIGM, HEPBIGM in the last 72 hours.  Studies/Results: CT ABDOMEN PELVIS W CONTRAST  Result Date: 10/14/2020 CLINICAL DATA:  Abdominal pain.  Nonlocalized. EXAM: CT ABDOMEN AND PELVIS WITH CONTRAST TECHNIQUE: Multidetector CT imaging of the abdomen and pelvis was performed using the standard protocol following bolus administration of intravenous contrast. CONTRAST:  67mL OMNIPAQUE IOHEXOL 300 MG/ML  SOLN COMPARISON:  CTA abdomen/pelvis 10/12/2020 FINDINGS: Lower chest: Chest heart is markedly enlarged. Basilar atelectasis noted bilaterally with small left pleural effusion. Hepatobiliary: Subtle nodularity of liver contour raises the question of cirrhosis. Noncalcified stones are visualized in the gallbladder, superimposed on vicarious excretion of contrast. No intrahepatic or extrahepatic biliary dilation. Pancreas: No focal mass lesion. No dilatation of the main duct. No intraparenchymal cyst. No peripancreatic edema. Spleen: No splenomegaly. No focal mass lesion. Adrenals/Urinary Tract: No adrenal nodule or mass. Marked right hydronephrosis is stable since  prior but progressive when comparing to 11/26/2018. Nonobstructing renal stone noted posterior calyx, better seen on noncontrast CT from 2 days ago. Overlying cortical thinning is compatible with chronicity. Mild fullness of the left intrarenal collecting system is similar. Areas of cortical scarring noted in the left kidney. No evidence for hydroureter. Urinary bladder is decompressed by Foley catheter. Stomach/Bowel: Stomach is unremarkable. No gastric wall thickening. No evidence of outlet obstruction. Duodenum is normally  positioned as is the ligament of Treitz. No small bowel wall thickening. No small bowel dilatation. The appendix is not well visualized, but there is no edema or inflammation in the region of the cecum. No gross colonic mass. No colonic wall thickening. Diverticular changes are noted in the left colon without evidence of diverticulitis. Wall thickening in the mid sigmoid colon likely related to muscular hypertrophy. Vascular/Lymphatic: There is abdominal aortic atherosclerosis without aneurysm. There is no gastrohepatic or hepatoduodenal ligament lymphadenopathy. No retroperitoneal or mesenteric lymphadenopathy. Portal vein and superior mesenteric vein are patent celiac axis and SMA are opacified. IMA is opacified. No pelvic sidewall lymphadenopathy. Reproductive: The uterus is surgically absent. There is no adnexal mass. Other: No intraperitoneal free fluid. Musculoskeletal: Small right groin hernia contains only fat. Diffuse body wall edema noted lower abdomen and pelvis. No worrisome lytic or sclerotic osseous abnormality. Degenerative changes noted lumbar spine. IMPRESSION: 1. No acute findings in the abdomen or pelvis. No bowel wall thickening. No interloop mesenteric or free intraperitoneal fluid. 2. Subtle nodularity of liver contour raises the question of cirrhosis. 3. Cholelithiasis. 4. Marked right hydronephrosis, similar to 10/12/2020 but mildly progressive compared to 11/26/2018. Overlying cortical thinning compatible with chronic process. No associated hydroureter suggests UPJ obstruction. 5. Nonobstructive right nephrolithiasis. 6. Left colonic diverticulosis without diverticulitis. 7. Small right groin hernia contains only fat. 8. Aortic Atherosclerosis (ICD10-I70.0). Electronically Signed   By: Misty Stanley M.D.   On: 10/14/2020 19:39   Scheduled Meds: . amiodarone  200 mg Oral Daily  . Chlorhexidine Gluconate Cloth  6 each Topical Daily  . dicyclomine  10 mg Oral TID AC & HS  . digoxin  125  mcg Oral Daily  . feeding supplement  1 Container Oral TID BM  . midodrine  10 mg Oral TID WC  . pantoprazole  40 mg Oral BID  . sodium chloride flush  10-40 mL Intracatheter Q12H   Continuous Infusions: . sodium chloride 10 mL/hr at 10/16/20 1000  . heparin 550 Units/hr (10/16/20 1033)  . phenylephrine (NEO-SYNEPHRINE) Adult infusion 20 mcg/min (10/16/20 1040)   PRN Meds:.acetaminophen **OR** acetaminophen, ALPRAZolam, morphine injection, ondansetron **OR** ondansetron (ZOFRAN) IV, polyethylene glycol, sodium chloride flush  ASSESMENT:   *   Diverticular bleed.  2/17 embolization of small distal branch of left colic vessel. Last maroon stool 2/19 and 2/20 in setting of IV heparin. Low abdominal pain pre and post embolization.  Bentyl conferring no benefit. Colon cancer with right hemicolectomy 1999.  No recurrent polyps on colonoscopies 2003, 2004.  Virtual colonoscopy 04/2014 with diverticulosis.   *   Blood loss anemia on top of anemia of chronic disease.  Hgb stable.   Hgb 8.7, down from admit level 12.7, nadir 6.5.  Baseline ~ 11.  S/p 5 PRBCs2/17 - 2/19.    *   Mechanical MVR.  Chronic Coumadin.  INR 1.6, down from max of 4.9 four d ago.  Reversed with FFP and vitamin K Heparin was stopped yesterday afternoon but restarted this morning.  *   Possible cirrhosis by CT  scan.  *    Thrombocytopenia.  Platelet nadir 54K.  Intermittent issue dating back several years.  *   R hydronephrosis.  Nonobstructive right nephrolithiasis.  *    Chronic hypotension.  On  midodrine during the day.  Also requiring IV neo (phenylephrine).  Pressures fluctuating as low as 80s/50s to 1 teens/60s.  *   Nonischemic CM.  EF < 20%    PLAN   *   Continue supportive care.  Would wait another day before restarting Coumadin.  *   As it seems to be of no benefit, stopping Ollen Barges  10/16/2020, 12:02 PM Phone [phone]

## 2020-10-16 NOTE — Progress Notes (Signed)
ANTICOAGULATION CONSULT NOTE - Initial Consult  Pharmacy Consult for IV Heparin Indication: atrial fibrillation and Mechanical valve  No Known Allergies  Patient Measurements: Height: 5\' 6"  (167.6 cm) Weight: 59.2 kg (130 lb 8.2 oz) IBW/kg (Calculated) : 59.3 Heparin Dosing Weight: 59.3 kg  Vital Signs: Temp: 98.2 F (36.8 C) (02/21 0800) Temp Source: Oral (02/21 0800) BP: 112/59 (02/21 0800) Pulse Rate: 73 (02/21 0800)  Labs: Recent Labs    10/14/20 0411 10/14/20 1257 10/15/20 0430 10/15/20 1556 10/15/20 2017 10/16/20 0458  HGB 7.7*   < > 8.6* 8.9* 8.9* 8.7*  HCT 23.2*   < > 26.2* 27.8* 27.4* 26.0*  PLT 54*  --  60*  --   --  80*  LABPROT 22.0*  --  20.6*  --   --  18.8*  INR 2.0*  --  1.8*  --   --  1.6*  CREATININE 0.83  --  0.77  --   --  0.79   < > = values in this interval not displayed.    Estimated Creatinine Clearance: 55.9 mL/min (by C-G formula based on SCr of 0.79 mg/dL).  Assessment: 75 year old female on Warfarin prior to admission for mechanical valve and atrial fibrillation admitted 2/17 with lower GIB  Concern for bloody bm yesterday Cbc remains stable  Goal of Therapy:  Heparin level 0.3-0.7 units/ml Monitor platelets by anticoagulation protocol: Yes   Plan:  Restart IV Heparin (no bolus) at 550 units/hr.  Heparin level 1700 Daily Heparin level, CBC, and INR.  Follow-up plans to resume Warfarin.   Barth Kirks, PharmD, BCPS, BCCCP Clinical Pharmacist [phone]  Please check AMION for all [place] numbers  10/16/2020 8:48 AM

## 2020-10-16 NOTE — Progress Notes (Signed)
Referring Physician(s): * No referring provider recorded for this case *  Supervising Physician: Ruthann Cancer  Patient Status:  [name] - In-pt  Chief Complaint: GI bleed s/p embolization 10/12/20 with Dr. Dwaine Gale  Subjective: Patient resting comfortably in chair with son at her side.  Reports she is feeling well today.  Denies further melena or hematochezia.  Heparin has been held, plans to resume today.   Allergies: Patient has no known allergies.  Medications: Prior to Admission medications   Medication Sig Start Date End Date Taking? Authorizing Provider  alendronate (FOSAMAX) 70 MG tablet Take 70 mg by mouth once a week. 08/26/19  Yes [provider]  ALPRAZolam Duanne Moron) 0.25 MG tablet Take 0.25 mg by mouth at bedtime as needed. 10/19/19  Yes [provider]  amiodarone (PACERONE) 200 MG tablet Take 1 tablet (200 mg total) by mouth daily. 10/23/19  Yes FieldsJessy Oto, MD  carvedilol (COREG) 3.125 MG tablet Take 1 tablet (3.125 mg total) by mouth 2 (two) times daily with a meal. 02/08/19  Yes Evans Lance, MD  digoxin (LANOXIN) 0.125 MG tablet Take 125 mcg by mouth daily. 08/26/19  Yes [provider]  furosemide (LASIX) 20 MG tablet Take 20 mg by mouth daily. 07/20/19  Yes [provider]  midodrine (PROAMATINE) 2.5 MG tablet Take 5 mg by mouth 2 (two) times daily. 09/28/20  Yes [provider]  oxyCODONE (OXY IR/ROXICODONE) 5 MG immediate release tablet Take 1 tablet (5 mg total) by mouth every 6 (six) hours as needed for moderate pain. 10/08/19  Yes Dagoberto Ligas, PA-C  tiZANidine (ZANAFLEX) 4 MG tablet Take 4 mg by mouth daily as needed for muscle spasms.  09/28/19  Yes [provider]  warfarin (COUMADIN) 1 MG tablet Take 1 mg by mouth daily. Take 5 tablet (1 mg tablet) by mouth on Mon, Thurs   Yes [provider]  warfarin (COUMADIN) 5 MG tablet Take 5 mg by mouth daily. Take 6 ( 5mg  tablets) by mouth on Tues, wed,  Fri, Sat, Sunday as directed   Yes [provider]     Vital Signs: BP 103/60   Pulse 71   Temp 98.2 F (36.8 C) (Oral)   Resp (!) 22   Ht 5\' 6"  (1.676 m)   Wt 130 lb 8.2 oz (59.2 kg)   SpO2 95%   BMI 21.07 kg/m   Physical Exam Vitals and nursing note reviewed.   NAD, alert, resting in chair Chest: no increased work of breathing, no distress.  Abdomen: soft, non-tender.    Imaging: CT ABDOMEN PELVIS W CONTRAST  Result Date: 10/14/2020 CLINICAL DATA:  Abdominal pain.  Nonlocalized. EXAM: CT ABDOMEN AND PELVIS WITH CONTRAST TECHNIQUE: Multidetector CT imaging of the abdomen and pelvis was performed using the standard protocol following bolus administration of intravenous contrast. CONTRAST:  103mL OMNIPAQUE IOHEXOL 300 MG/ML  SOLN COMPARISON:  CTA abdomen/pelvis 10/12/2020 FINDINGS: Lower chest: Chest heart is markedly enlarged. Basilar atelectasis noted bilaterally with small left pleural effusion. Hepatobiliary: Subtle nodularity of liver contour raises the question of cirrhosis. Noncalcified stones are visualized in the gallbladder, superimposed on vicarious excretion of contrast. No intrahepatic or extrahepatic biliary dilation. Pancreas: No focal mass lesion. No dilatation of the main duct. No intraparenchymal cyst. No peripancreatic edema. Spleen: No splenomegaly. No focal mass lesion. Adrenals/Urinary Tract: No adrenal nodule or mass. Marked right hydronephrosis is stable since prior but progressive when comparing to 11/26/2018. Nonobstructing renal stone noted posterior calyx, better  seen on noncontrast CT from 2 days ago. Overlying cortical thinning is compatible with chronicity. Mild fullness of the left intrarenal collecting system is similar. Areas of cortical scarring noted in the left kidney. No evidence for hydroureter. Urinary bladder is decompressed by Foley catheter. Stomach/Bowel: Stomach is unremarkable. No gastric wall thickening. No evidence of outlet  obstruction. Duodenum is normally positioned as is the ligament of Treitz. No small bowel wall thickening. No small bowel dilatation. The appendix is not well visualized, but there is no edema or inflammation in the region of the cecum. No gross colonic mass. No colonic wall thickening. Diverticular changes are noted in the left colon without evidence of diverticulitis. Wall thickening in the mid sigmoid colon likely related to muscular hypertrophy. Vascular/Lymphatic: There is abdominal aortic atherosclerosis without aneurysm. There is no gastrohepatic or hepatoduodenal ligament lymphadenopathy. No retroperitoneal or mesenteric lymphadenopathy. Portal vein and superior mesenteric vein are patent celiac axis and SMA are opacified. IMA is opacified. No pelvic sidewall lymphadenopathy. Reproductive: The uterus is surgically absent. There is no adnexal mass. Other: No intraperitoneal free fluid. Musculoskeletal: Small right groin hernia contains only fat. Diffuse body wall edema noted lower abdomen and pelvis. No worrisome lytic or sclerotic osseous abnormality. Degenerative changes noted lumbar spine. IMPRESSION: 1. No acute findings in the abdomen or pelvis. No bowel wall thickening. No interloop mesenteric or free intraperitoneal fluid. 2. Subtle nodularity of liver contour raises the question of cirrhosis. 3. Cholelithiasis. 4. Marked right hydronephrosis, similar to 10/12/2020 but mildly progressive compared to 11/26/2018. Overlying cortical thinning compatible with chronic process. No associated hydroureter suggests UPJ obstruction. 5. Nonobstructive right nephrolithiasis. 6. Left colonic diverticulosis without diverticulitis. 7. Small right groin hernia contains only fat. 8. Aortic Atherosclerosis (ICD10-I70.0). Electronically Signed   By: Misty Stanley M.D.   On: 10/14/2020 19:39   IR Angiogram Visceral Selective  Result Date: 10/12/2020 INDICATION: 75 year old woman with acute GI bleed in hypertension  requiring pressors presents to interventional radiology for angiogram and possible embolization. CT angiography of the abdomen and pelvis showed acute hemorrhage in the descending colon in a background of diverticulosis. Patient's INR was elevated at 4.9 on presentation. Patient is on Coumadin for mechanical mitral valve. INR improved to 1.9 after administration of 2 units of FFP. Given patient's tenuous condition decision was made to perform angiogram and possible embolization. Central venous catheter was placed in anticipation of blood draws, blood product administration, and fluid resuscitation. EXAM: 1. Ultrasound-guided access of left common femoral artery. 2. Superior mesenteric angiogram and selective angiogram of proximal jejunal branches 3. Inferior mesenteric angiogram and selective angiogram and embolization of left colic branches 4. Angio-Seal closure of left common femoral artery access 5. Dual lumen central venous catheter placement MEDICATIONS: None ANESTHESIA/SEDATION: Moderate (conscious) sedation was employed during this procedure. A total of Versed 1 mg and Fentanyl 25 mcg was administered intravenously. Moderate Sedation Time: 2 hours and 9 minutes. The patient's level of consciousness and vital signs were monitored continuously by radiology nursing throughout the procedure under my direct supervision. FLUOROSCOPY TIME:  Fluoroscopy Time: 37 minutes 36 seconds (325 mGy). COMPLICATIONS: None immediate. PROCEDURE: Informed consent was obtained from the patient following explanation of the procedure, risks, benefits and alternatives. The patient understands, agrees and consents for the procedure. All questions were addressed. A time out was performed prior to the initiation of the procedure. Maximal barrier sterile technique utilized including caps, mask, sterile gowns, sterile gloves, large sterile drape, hand hygiene, and chlorhexidine prep. Patient positioned supine on  the angiography table.  Right neck prepped and draped in the usual sterile fashion. All elements of maximal sterile barrier were utilized including, cap, mask, sterile gown, sterile gloves, large sterile drape, hand scrubbing and 2% Chlorhexidine for skin cleaning. The right internal jugular vein was evaluated with ultrasound and shown to be patent. A permanent ultrasound image was obtained and placed in the patient's medical record. Using sterile gel and a sterile probe cover, the right internal jugular vein was entered with a 21 ga needle during real time ultrasound guidance. 0.018 inch guidewire placed and 21 ga needle exchanged for peel-away sheath Dual lumen central venous catheter was inserted over guidewire. The tip was positioned at the cavoatrial junction. All lumens aspirated and flushed well. The catheter was secured to the skin with suture. The insertion site was was covered with bio patch and sterile dressing. Left groin skin was prepped and draped in the usual sterile fashion. All elements of maximal sterile barrier were utilized including, cap, mask, sterile gown, sterile gloves, large sterile drape, hand scrubbing and 2% Chlorhexidine for skin cleaning. The left common femoral artery was evaluated with ultrasound and shown to be patent. A permanent ultrasound image was obtained and placed in the patient's medical record. Using sterile gel and a sterile probe cover, the left common femoral artery was entered with a 21 ga needle during real time ultrasound guidance. 21 gauge needle removed over 0.018 inch guidewire and replaced with a transitional dilator set. Five [place] dilator exchanged for 5 French sheath over 0.035 inch guidewire. The S1 catheter advanced to the superior mesenteric artery and angiogram was performed. No active extravasation was identified. Utilizing Progreat microcatheter, proximal jejunal branch was selected and angiogram was performed. No flow to the colon was identified. No active extravasation was  seen. The inferior mesenteric artery was selected with a Mickelson catheter. Angiogram showed active extravasation in the descending colon. Progreat microcatheter was advanced into the left colic branch and selective angiogram again showed contribution from small branches beyond the marginal artery of [place]. The more superior branch was successfully selected and embolized with a single 2 mm x 2 cm soft interlock coil. Post angiogram demonstrated no significant flow in the embolized artery and no extravasation. Numerous efforts were made to access the more inferior branch extending to the region of extravasation, however none were successful despite utilizing several different wires and micro catheters. Given that no additional extravasation was identified, decision was made to terminate the procedure without embolizing this more inferior branch. Groin sheath angiogram demonstrated appropriate access of the common femoral artery at the level of the femoral head. The groin was re-prepped and draped and closed with a 6 [place] Angio-Seal device. 2 minutes of additional compression applied to achieve hemostasis. IMPRESSION: Inferior mesenteric arteriogram demonstrates active hemorrhage in the descending colon. Two distal branches beyond the marginal artery of [place] appeared to be supplying the area extravasation. The superior of these 2 branches was selected and embolized with a micro coil. Despite prolonged efforts, the more inferior branch could not be selected for embolization. No active extravasation identified at the end of the procedure. Electronically Signed   By: Miachel Roux M.D.   On: 10/12/2020 20:45   IR Angiogram Visceral Selective  Result Date: 10/12/2020 INDICATION: 75 year old woman with acute GI bleed in hypertension requiring pressors presents to interventional radiology for angiogram and possible embolization. CT angiography of the abdomen and pelvis showed acute hemorrhage in the descending  colon in a background of diverticulosis.  Patient's INR was elevated at 4.9 on presentation. Patient is on Coumadin for mechanical mitral valve. INR improved to 1.9 after administration of 2 units of FFP. Given patient's tenuous condition decision was made to perform angiogram and possible embolization. Central venous catheter was placed in anticipation of blood draws, blood product administration, and fluid resuscitation. EXAM: 1. Ultrasound-guided access of left common femoral artery. 2. Superior mesenteric angiogram and selective angiogram of proximal jejunal branches 3. Inferior mesenteric angiogram and selective angiogram and embolization of left colic branches 4. Angio-Seal closure of left common femoral artery access 5. Dual lumen central venous catheter placement MEDICATIONS: None ANESTHESIA/SEDATION: Moderate (conscious) sedation was employed during this procedure. A total of Versed 1 mg and Fentanyl 25 mcg was administered intravenously. Moderate Sedation Time: 2 hours and 9 minutes. The patient's level of consciousness and vital signs were monitored continuously by radiology nursing throughout the procedure under my direct supervision. FLUOROSCOPY TIME:  Fluoroscopy Time: 37 minutes 36 seconds (325 mGy). COMPLICATIONS: None immediate. PROCEDURE: Informed consent was obtained from the patient following explanation of the procedure, risks, benefits and alternatives. The patient understands, agrees and consents for the procedure. All questions were addressed. A time out was performed prior to the initiation of the procedure. Maximal barrier sterile technique utilized including caps, mask, sterile gowns, sterile gloves, large sterile drape, hand hygiene, and chlorhexidine prep. Patient positioned supine on the angiography table. Right neck prepped and draped in the usual sterile fashion. All elements of maximal sterile barrier were utilized including, cap, mask, sterile gown, sterile gloves, large sterile  drape, hand scrubbing and 2% Chlorhexidine for skin cleaning. The right internal jugular vein was evaluated with ultrasound and shown to be patent. A permanent ultrasound image was obtained and placed in the patient's medical record. Using sterile gel and a sterile probe cover, the right internal jugular vein was entered with a 21 ga needle during real time ultrasound guidance. 0.018 inch guidewire placed and 21 ga needle exchanged for peel-away sheath Dual lumen central venous catheter was inserted over guidewire. The tip was positioned at the cavoatrial junction. All lumens aspirated and flushed well. The catheter was secured to the skin with suture. The insertion site was was covered with bio patch and sterile dressing. Left groin skin was prepped and draped in the usual sterile fashion. All elements of maximal sterile barrier were utilized including, cap, mask, sterile gown, sterile gloves, large sterile drape, hand scrubbing and 2% Chlorhexidine for skin cleaning. The left common femoral artery was evaluated with ultrasound and shown to be patent. A permanent ultrasound image was obtained and placed in the patient's medical record. Using sterile gel and a sterile probe cover, the left common femoral artery was entered with a 21 ga needle during real time ultrasound guidance. 21 gauge needle removed over 0.018 inch guidewire and replaced with a transitional dilator set. Five [place] dilator exchanged for 5 French sheath over 0.035 inch guidewire. The S1 catheter advanced to the superior mesenteric artery and angiogram was performed. No active extravasation was identified. Utilizing Progreat microcatheter, proximal jejunal branch was selected and angiogram was performed. No flow to the colon was identified. No active extravasation was seen. The inferior mesenteric artery was selected with a Mickelson catheter. Angiogram showed active extravasation in the descending colon. Progreat microcatheter was advanced into  the left colic branch and selective angiogram again showed contribution from small branches beyond the marginal artery of [place]. The more superior branch was successfully selected and embolized with a single  2 mm x 2 cm soft interlock coil. Post angiogram demonstrated no significant flow in the embolized artery and no extravasation. Numerous efforts were made to access the more inferior branch extending to the region of extravasation, however none were successful despite utilizing several different wires and micro catheters. Given that no additional extravasation was identified, decision was made to terminate the procedure without embolizing this more inferior branch. Groin sheath angiogram demonstrated appropriate access of the common femoral artery at the level of the femoral head. The groin was re-prepped and draped and closed with a 6 [place] Angio-Seal device. 2 minutes of additional compression applied to achieve hemostasis. IMPRESSION: Inferior mesenteric arteriogram demonstrates active hemorrhage in the descending colon. Two distal branches beyond the marginal artery of [place] appeared to be supplying the area extravasation. The superior of these 2 branches was selected and embolized with a micro coil. Despite prolonged efforts, the more inferior branch could not be selected for embolization. No active extravasation identified at the end of the procedure. Electronically Signed   By: Miachel Roux M.D.   On: 10/12/2020 20:45   IR Fluoro Guide CV Line Right  Result Date: 10/12/2020 INDICATION: 75 year old woman with acute GI bleed in hypertension requiring pressors presents to interventional radiology for angiogram and possible embolization. CT angiography of the abdomen and pelvis showed acute hemorrhage in the descending colon in a background of diverticulosis. Patient's INR was elevated at 4.9 on presentation. Patient is on Coumadin for mechanical mitral valve. INR improved to 1.9 after  administration of 2 units of FFP. Given patient's tenuous condition decision was made to perform angiogram and possible embolization. Central venous catheter was placed in anticipation of blood draws, blood product administration, and fluid resuscitation. EXAM: 1. Ultrasound-guided access of left common femoral artery. 2. Superior mesenteric angiogram and selective angiogram of proximal jejunal branches 3. Inferior mesenteric angiogram and selective angiogram and embolization of left colic branches 4. Angio-Seal closure of left common femoral artery access 5. Dual lumen central venous catheter placement MEDICATIONS: None ANESTHESIA/SEDATION: Moderate (conscious) sedation was employed during this procedure. A total of Versed 1 mg and Fentanyl 25 mcg was administered intravenously. Moderate Sedation Time: 2 hours and 9 minutes. The patient's level of consciousness and vital signs were monitored continuously by radiology nursing throughout the procedure under my direct supervision. FLUOROSCOPY TIME:  Fluoroscopy Time: 37 minutes 36 seconds (325 mGy). COMPLICATIONS: None immediate. PROCEDURE: Informed consent was obtained from the patient following explanation of the procedure, risks, benefits and alternatives. The patient understands, agrees and consents for the procedure. All questions were addressed. A time out was performed prior to the initiation of the procedure. Maximal barrier sterile technique utilized including caps, mask, sterile gowns, sterile gloves, large sterile drape, hand hygiene, and chlorhexidine prep. Patient positioned supine on the angiography table. Right neck prepped and draped in the usual sterile fashion. All elements of maximal sterile barrier were utilized including, cap, mask, sterile gown, sterile gloves, large sterile drape, hand scrubbing and 2% Chlorhexidine for skin cleaning. The right internal jugular vein was evaluated with ultrasound and shown to be patent. A permanent ultrasound  image was obtained and placed in the patient's medical record. Using sterile gel and a sterile probe cover, the right internal jugular vein was entered with a 21 ga needle during real time ultrasound guidance. 0.018 inch guidewire placed and 21 ga needle exchanged for peel-away sheath Dual lumen central venous catheter was inserted over guidewire. The tip was positioned at the cavoatrial junction. All  lumens aspirated and flushed well. The catheter was secured to the skin with suture. The insertion site was was covered with bio patch and sterile dressing. Left groin skin was prepped and draped in the usual sterile fashion. All elements of maximal sterile barrier were utilized including, cap, mask, sterile gown, sterile gloves, large sterile drape, hand scrubbing and 2% Chlorhexidine for skin cleaning. The left common femoral artery was evaluated with ultrasound and shown to be patent. A permanent ultrasound image was obtained and placed in the patient's medical record. Using sterile gel and a sterile probe cover, the left common femoral artery was entered with a 21 ga needle during real time ultrasound guidance. 21 gauge needle removed over 0.018 inch guidewire and replaced with a transitional dilator set. Five [place] dilator exchanged for 5 French sheath over 0.035 inch guidewire. The S1 catheter advanced to the superior mesenteric artery and angiogram was performed. No active extravasation was identified. Utilizing Progreat microcatheter, proximal jejunal branch was selected and angiogram was performed. No flow to the colon was identified. No active extravasation was seen. The inferior mesenteric artery was selected with a Mickelson catheter. Angiogram showed active extravasation in the descending colon. Progreat microcatheter was advanced into the left colic branch and selective angiogram again showed contribution from small branches beyond the marginal artery of [place]. The more superior branch was  successfully selected and embolized with a single 2 mm x 2 cm soft interlock coil. Post angiogram demonstrated no significant flow in the embolized artery and no extravasation. Numerous efforts were made to access the more inferior branch extending to the region of extravasation, however none were successful despite utilizing several different wires and micro catheters. Given that no additional extravasation was identified, decision was made to terminate the procedure without embolizing this more inferior branch. Groin sheath angiogram demonstrated appropriate access of the common femoral artery at the level of the femoral head. The groin was re-prepped and draped and closed with a 6 [place] Angio-Seal device. 2 minutes of additional compression applied to achieve hemostasis. IMPRESSION: Inferior mesenteric arteriogram demonstrates active hemorrhage in the descending colon. Two distal branches beyond the marginal artery of [place] appeared to be supplying the area extravasation. The superior of these 2 branches was selected and embolized with a micro coil. Despite prolonged efforts, the more inferior branch could not be selected for embolization. No active extravasation identified at the end of the procedure. Electronically Signed   By: Miachel Roux M.D.   On: 10/12/2020 20:45   IR US Guide Vasc Access Left  Result Date: 10/12/2020 INDICATION: 75 year old woman with acute GI bleed in hypertension requiring pressors presents to interventional radiology for angiogram and possible embolization. CT angiography of the abdomen and pelvis showed acute hemorrhage in the descending colon in a background of diverticulosis. Patient's INR was elevated at 4.9 on presentation. Patient is on Coumadin for mechanical mitral valve. INR improved to 1.9 after administration of 2 units of FFP. Given patient's tenuous condition decision was made to perform angiogram and possible embolization. Central venous catheter was placed in  anticipation of blood draws, blood product administration, and fluid resuscitation. EXAM: 1. Ultrasound-guided access of left common femoral artery. 2. Superior mesenteric angiogram and selective angiogram of proximal jejunal branches 3. Inferior mesenteric angiogram and selective angiogram and embolization of left colic branches 4. Angio-Seal closure of left common femoral artery access 5. Dual lumen central venous catheter placement MEDICATIONS: None ANESTHESIA/SEDATION: Moderate (conscious) sedation was employed during this procedure. A total of Versed  1 mg and Fentanyl 25 mcg was administered intravenously. Moderate Sedation Time: 2 hours and 9 minutes. The patient's level of consciousness and vital signs were monitored continuously by radiology nursing throughout the procedure under my direct supervision. FLUOROSCOPY TIME:  Fluoroscopy Time: 37 minutes 36 seconds (325 mGy). COMPLICATIONS: None immediate. PROCEDURE: Informed consent was obtained from the patient following explanation of the procedure, risks, benefits and alternatives. The patient understands, agrees and consents for the procedure. All questions were addressed. A time out was performed prior to the initiation of the procedure. Maximal barrier sterile technique utilized including caps, mask, sterile gowns, sterile gloves, large sterile drape, hand hygiene, and chlorhexidine prep. Patient positioned supine on the angiography table. Right neck prepped and draped in the usual sterile fashion. All elements of maximal sterile barrier were utilized including, cap, mask, sterile gown, sterile gloves, large sterile drape, hand scrubbing and 2% Chlorhexidine for skin cleaning. The right internal jugular vein was evaluated with ultrasound and shown to be patent. A permanent ultrasound image was obtained and placed in the patient's medical record. Using sterile gel and a sterile probe cover, the right internal jugular vein was entered with a 21 ga needle  during real time ultrasound guidance. 0.018 inch guidewire placed and 21 ga needle exchanged for peel-away sheath Dual lumen central venous catheter was inserted over guidewire. The tip was positioned at the cavoatrial junction. All lumens aspirated and flushed well. The catheter was secured to the skin with suture. The insertion site was was covered with bio patch and sterile dressing. Left groin skin was prepped and draped in the usual sterile fashion. All elements of maximal sterile barrier were utilized including, cap, mask, sterile gown, sterile gloves, large sterile drape, hand scrubbing and 2% Chlorhexidine for skin cleaning. The left common femoral artery was evaluated with ultrasound and shown to be patent. A permanent ultrasound image was obtained and placed in the patient's medical record. Using sterile gel and a sterile probe cover, the left common femoral artery was entered with a 21 ga needle during real time ultrasound guidance. 21 gauge needle removed over 0.018 inch guidewire and replaced with a transitional dilator set. Five [place] dilator exchanged for 5 French sheath over 0.035 inch guidewire. The S1 catheter advanced to the superior mesenteric artery and angiogram was performed. No active extravasation was identified. Utilizing Progreat microcatheter, proximal jejunal branch was selected and angiogram was performed. No flow to the colon was identified. No active extravasation was seen. The inferior mesenteric artery was selected with a Mickelson catheter. Angiogram showed active extravasation in the descending colon. Progreat microcatheter was advanced into the left colic branch and selective angiogram again showed contribution from small branches beyond the marginal artery of [place]. The more superior branch was successfully selected and embolized with a single 2 mm x 2 cm soft interlock coil. Post angiogram demonstrated no significant flow in the embolized artery and no extravasation.  Numerous efforts were made to access the more inferior branch extending to the region of extravasation, however none were successful despite utilizing several different wires and micro catheters. Given that no additional extravasation was identified, decision was made to terminate the procedure without embolizing this more inferior branch. Groin sheath angiogram demonstrated appropriate access of the common femoral artery at the level of the femoral head. The groin was re-prepped and draped and closed with a 6 [place] Angio-Seal device. 2 minutes of additional compression applied to achieve hemostasis. IMPRESSION: Inferior mesenteric arteriogram demonstrates active hemorrhage in the descending  colon. Two distal branches beyond the marginal artery of [place] appeared to be supplying the area extravasation. The superior of these 2 branches was selected and embolized with a micro coil. Despite prolonged efforts, the more inferior branch could not be selected for embolization. No active extravasation identified at the end of the procedure. Electronically Signed   By: Miachel Roux M.D.   On: 10/12/2020 20:45   IR US Guide Vasc Access Right  Result Date: 10/12/2020 INDICATION: 75 year old woman with acute GI bleed in hypertension requiring pressors presents to interventional radiology for angiogram and possible embolization. CT angiography of the abdomen and pelvis showed acute hemorrhage in the descending colon in a background of diverticulosis. Patient's INR was elevated at 4.9 on presentation. Patient is on Coumadin for mechanical mitral valve. INR improved to 1.9 after administration of 2 units of FFP. Given patient's tenuous condition decision was made to perform angiogram and possible embolization. Central venous catheter was placed in anticipation of blood draws, blood product administration, and fluid resuscitation. EXAM: 1. Ultrasound-guided access of left common femoral artery. 2. Superior mesenteric  angiogram and selective angiogram of proximal jejunal branches 3. Inferior mesenteric angiogram and selective angiogram and embolization of left colic branches 4. Angio-Seal closure of left common femoral artery access 5. Dual lumen central venous catheter placement MEDICATIONS: None ANESTHESIA/SEDATION: Moderate (conscious) sedation was employed during this procedure. A total of Versed 1 mg and Fentanyl 25 mcg was administered intravenously. Moderate Sedation Time: 2 hours and 9 minutes. The patient's level of consciousness and vital signs were monitored continuously by radiology nursing throughout the procedure under my direct supervision. FLUOROSCOPY TIME:  Fluoroscopy Time: 37 minutes 36 seconds (325 mGy). COMPLICATIONS: None immediate. PROCEDURE: Informed consent was obtained from the patient following explanation of the procedure, risks, benefits and alternatives. The patient understands, agrees and consents for the procedure. All questions were addressed. A time out was performed prior to the initiation of the procedure. Maximal barrier sterile technique utilized including caps, mask, sterile gowns, sterile gloves, large sterile drape, hand hygiene, and chlorhexidine prep. Patient positioned supine on the angiography table. Right neck prepped and draped in the usual sterile fashion. All elements of maximal sterile barrier were utilized including, cap, mask, sterile gown, sterile gloves, large sterile drape, hand scrubbing and 2% Chlorhexidine for skin cleaning. The right internal jugular vein was evaluated with ultrasound and shown to be patent. A permanent ultrasound image was obtained and placed in the patient's medical record. Using sterile gel and a sterile probe cover, the right internal jugular vein was entered with a 21 ga needle during real time ultrasound guidance. 0.018 inch guidewire placed and 21 ga needle exchanged for peel-away sheath Dual lumen central venous catheter was inserted over  guidewire. The tip was positioned at the cavoatrial junction. All lumens aspirated and flushed well. The catheter was secured to the skin with suture. The insertion site was was covered with bio patch and sterile dressing. Left groin skin was prepped and draped in the usual sterile fashion. All elements of maximal sterile barrier were utilized including, cap, mask, sterile gown, sterile gloves, large sterile drape, hand scrubbing and 2% Chlorhexidine for skin cleaning. The left common femoral artery was evaluated with ultrasound and shown to be patent. A permanent ultrasound image was obtained and placed in the patient's medical record. Using sterile gel and a sterile probe cover, the left common femoral artery was entered with a 21 ga needle during real time ultrasound guidance. 21 gauge needle removed  over 0.018 inch guidewire and replaced with a transitional dilator set. Five [place] dilator exchanged for 5 French sheath over 0.035 inch guidewire. The S1 catheter advanced to the superior mesenteric artery and angiogram was performed. No active extravasation was identified. Utilizing Progreat microcatheter, proximal jejunal branch was selected and angiogram was performed. No flow to the colon was identified. No active extravasation was seen. The inferior mesenteric artery was selected with a Mickelson catheter. Angiogram showed active extravasation in the descending colon. Progreat microcatheter was advanced into the left colic branch and selective angiogram again showed contribution from small branches beyond the marginal artery of [place]. The more superior branch was successfully selected and embolized with a single 2 mm x 2 cm soft interlock coil. Post angiogram demonstrated no significant flow in the embolized artery and no extravasation. Numerous efforts were made to access the more inferior branch extending to the region of extravasation, however none were successful despite utilizing several different  wires and micro catheters. Given that no additional extravasation was identified, decision was made to terminate the procedure without embolizing this more inferior branch. Groin sheath angiogram demonstrated appropriate access of the common femoral artery at the level of the femoral head. The groin was re-prepped and draped and closed with a 6 [place] Angio-Seal device. 2 minutes of additional compression applied to achieve hemostasis. IMPRESSION: Inferior mesenteric arteriogram demonstrates active hemorrhage in the descending colon. Two distal branches beyond the marginal artery of [place] appeared to be supplying the area extravasation. The superior of these 2 branches was selected and embolized with a micro coil. Despite prolonged efforts, the more inferior branch could not be selected for embolization. No active extravasation identified at the end of the procedure. Electronically Signed   By: Miachel Roux M.D.   On: 10/12/2020 20:45   DG Chest Port 1 View  Result Date: 10/13/2020 CLINICAL DATA:  Vomiting EXAM: PORTABLE CHEST 1 VIEW COMPARISON:  Yesterday FINDINGS: Cardiopericardial enlargement. There is biventricular pacer leads and mitral valve replacement. New right IJ line with tip at the upper right atrium. There is no edema, consolidation, effusion, or pneumothorax. IMPRESSION: No evidence of active disease. Electronically Signed   By: Monte Fantasia M.D.   On: 10/13/2020 06:40   IR EMBO ART  VEN HEMORR LYMPH EXTRAV  INC GUIDE ROADMAPPING  Result Date: 10/12/2020 INDICATION: 75 year old woman with acute GI bleed in hypertension requiring pressors presents to interventional radiology for angiogram and possible embolization. CT angiography of the abdomen and pelvis showed acute hemorrhage in the descending colon in a background of diverticulosis. Patient's INR was elevated at 4.9 on presentation. Patient is on Coumadin for mechanical mitral valve. INR improved to 1.9 after administration of 2 units  of FFP. Given patient's tenuous condition decision was made to perform angiogram and possible embolization. Central venous catheter was placed in anticipation of blood draws, blood product administration, and fluid resuscitation. EXAM: 1. Ultrasound-guided access of left common femoral artery. 2. Superior mesenteric angiogram and selective angiogram of proximal jejunal branches 3. Inferior mesenteric angiogram and selective angiogram and embolization of left colic branches 4. Angio-Seal closure of left common femoral artery access 5. Dual lumen central venous catheter placement MEDICATIONS: None ANESTHESIA/SEDATION: Moderate (conscious) sedation was employed during this procedure. A total of Versed 1 mg and Fentanyl 25 mcg was administered intravenously. Moderate Sedation Time: 2 hours and 9 minutes. The patient's level of consciousness and vital signs were monitored continuously by radiology nursing throughout the procedure under my direct supervision. FLUOROSCOPY TIME:  Fluoroscopy Time: 37 minutes 36 seconds (325 mGy). COMPLICATIONS: None immediate. PROCEDURE: Informed consent was obtained from the patient following explanation of the procedure, risks, benefits and alternatives. The patient understands, agrees and consents for the procedure. All questions were addressed. A time out was performed prior to the initiation of the procedure. Maximal barrier sterile technique utilized including caps, mask, sterile gowns, sterile gloves, large sterile drape, hand hygiene, and chlorhexidine prep. Patient positioned supine on the angiography table. Right neck prepped and draped in the usual sterile fashion. All elements of maximal sterile barrier were utilized including, cap, mask, sterile gown, sterile gloves, large sterile drape, hand scrubbing and 2% Chlorhexidine for skin cleaning. The right internal jugular vein was evaluated with ultrasound and shown to be patent. A permanent ultrasound image was obtained and placed  in the patient's medical record. Using sterile gel and a sterile probe cover, the right internal jugular vein was entered with a 21 ga needle during real time ultrasound guidance. 0.018 inch guidewire placed and 21 ga needle exchanged for peel-away sheath Dual lumen central venous catheter was inserted over guidewire. The tip was positioned at the cavoatrial junction. All lumens aspirated and flushed well. The catheter was secured to the skin with suture. The insertion site was was covered with bio patch and sterile dressing. Left groin skin was prepped and draped in the usual sterile fashion. All elements of maximal sterile barrier were utilized including, cap, mask, sterile gown, sterile gloves, large sterile drape, hand scrubbing and 2% Chlorhexidine for skin cleaning. The left common femoral artery was evaluated with ultrasound and shown to be patent. A permanent ultrasound image was obtained and placed in the patient's medical record. Using sterile gel and a sterile probe cover, the left common femoral artery was entered with a 21 ga needle during real time ultrasound guidance. 21 gauge needle removed over 0.018 inch guidewire and replaced with a transitional dilator set. Five [place] dilator exchanged for 5 French sheath over 0.035 inch guidewire. The S1 catheter advanced to the superior mesenteric artery and angiogram was performed. No active extravasation was identified. Utilizing Progreat microcatheter, proximal jejunal branch was selected and angiogram was performed. No flow to the colon was identified. No active extravasation was seen. The inferior mesenteric artery was selected with a Mickelson catheter. Angiogram showed active extravasation in the descending colon. Progreat microcatheter was advanced into the left colic branch and selective angiogram again showed contribution from small branches beyond the marginal artery of [place]. The more superior branch was successfully selected and embolized with  a single 2 mm x 2 cm soft interlock coil. Post angiogram demonstrated no significant flow in the embolized artery and no extravasation. Numerous efforts were made to access the more inferior branch extending to the region of extravasation, however none were successful despite utilizing several different wires and micro catheters. Given that no additional extravasation was identified, decision was made to terminate the procedure without embolizing this more inferior branch. Groin sheath angiogram demonstrated appropriate access of the common femoral artery at the level of the femoral head. The groin was re-prepped and draped and closed with a 6 [place] Angio-Seal device. 2 minutes of additional compression applied to achieve hemostasis. IMPRESSION: Inferior mesenteric arteriogram demonstrates active hemorrhage in the descending colon. Two distal branches beyond the marginal artery of [place] appeared to be supplying the area extravasation. The superior of these 2 branches was selected and embolized with a micro coil. Despite prolonged efforts, the more inferior branch could not be  selected for embolization. No active extravasation identified at the end of the procedure. Electronically Signed   By: Miachel Roux M.D.   On: 10/12/2020 20:45   ECHOCARDIOGRAM LIMITED  Result Date: 10/12/2020    ECHOCARDIOGRAM LIMITED REPORT   Patient Name:   Angela [name] Date of Exam: 10/12/2020 Medical Rec #:  [id number]    Height:       66.0 in Accession #:    [id number]   Weight:       134.0 lb Date of Birth:  03/18/1946    BSA:          1.687 m Patient Age:    52 years     BP:           80/58 mmHg Patient Gender: F            HR:           70 bpm. Exam Location:  Inpatient Procedure: Limited Echo, Color Doppler and Cardiac Doppler Indications:    R07.9* Chest pain, unspecified  History:        Patient has prior history of Echocardiogram examinations, most                 recent 10/27/2018. Defibrillator; Arrythmias:Atrial  Fibrillation.                 Mechanical Mitral Valve Replacement.                  Mitral Valve: mechanical valve valve is present in the mitral                 position.  Sonographer:    Raquel Sarna Senior RDCS Referring Phys: [id number] Donita Brooks  Sonographer Comments: Limited to recheck EF per notes. IMPRESSIONS  1. Left ventricular ejection fraction, by estimation, is <20%. The left ventricle has severely decreased function. The left ventricle demonstrates global hypokinesis. The left ventricular internal cavity size was severely dilated.  2. Left atrial size was severely dilated.  3. Right atrial size was severely dilated.  4. The mitral valve has been repaired/replaced. There is a mechanical valve present in the mitral position.  5. The aortic valve is tricuspid. Aortic valve regurgitation is trivial.  6. The inferior vena cava is normal in size with greater than 50% respiratory variability, suggesting right atrial pressure of 3 mmHg. Comparison(s): Prior images reviewed side by side. Conclusion(s)/Recommendation(s): Limited echo for EF, which remains severely reduced at <20%. Prior images reviewed and are similar. FINDINGS  Left Ventricle: Left ventricular ejection fraction, by estimation, is <20%. The left ventricle has severely decreased function. The left ventricle demonstrates global hypokinesis. The left ventricular internal cavity size was severely dilated. Left Atrium: Left atrial size was severely dilated. Right Atrium: Right atrial size was severely dilated. Mitral Valve: The mitral valve has been repaired/replaced. There is a mechanical valve present in the mitral position. Tricuspid Valve: The tricuspid valve is grossly normal. Tricuspid valve regurgitation is trivial. Aortic Valve: The aortic valve is tricuspid. Aortic valve regurgitation is trivial. Venous: The inferior vena cava is normal in size with greater than 50% respiratory variability, suggesting right atrial pressure of 3 mmHg. Additional  Comments: A pacer wire is visualized in the right atrium and right ventricle. RIGHT VENTRICLE RV S prime:     5.33 cm/s TAPSE (M-mode): 1.3 cm Buford Dresser MD Electronically signed by Buford Dresser MD Signature Date/Time: 10/12/2020/9:10:35 PM    Final     Labs:  CBC: Recent  Labs    10/13/20 0436 10/13/20 1723 10/14/20 0411 10/14/20 1257 10/15/20 0430 10/15/20 1556 10/15/20 2017 10/16/20 0458  WBC 13.5*  --  22.7*  --  16.0*  --   --  10.5  HGB 7.4*   < > 7.7*   < > 8.6* 8.9* 8.9* 8.7*  HCT 23.1*   < > 23.2*   < > 26.2* 27.8* 27.4* 26.0*  PLT 67*  --  54*  --  60*  --   --  80*   < > = values in this interval not displayed.    COAGS: Recent Labs    10/13/20 0436 10/14/20 0411 10/15/20 0430 10/16/20 0458  INR 1.6* 2.0* 1.8* 1.6*    BMP: Recent Labs    09/13/20 1142 10/04/20 1202 10/12/20 0303 10/13/20 0436 10/14/20 0411 10/15/20 0430 10/16/20 0458  NA 141 138   < > 139 137 138 136  K 4.8 4.8   < > 4.1 3.5 3.2* 3.7  CL 104 104   < > 111 110 107 108  CO2 20 20   < > 21* 22 22 21*  GLUCOSE 105* 113*   < > 104* 123* 83 96  BUN 17 26   < > 23 12 8 8   CALCIUM 9.9 9.7   < > 7.2* 7.5* 7.8* 7.6*  CREATININE 1.27* 0.92   < > 0.89 0.83 0.77 0.79  GFRNONAA 41* 61   < > >60 >60 >60 >60  GFRAA 47* 70  --   --   --   --   --    < > = values in this interval not displayed.    LIVER FUNCTION TESTS: Recent Labs    09/13/20 1142 10/12/20 0303 10/16/20 0458  BILITOT 0.6 0.7 1.0  AST 25 23 14*  ALT 19 15 15   ALKPHOS 79 47 38  PROT 7.3 5.9* 4.5*  ALBUMIN 4.5 3.4* 2.2*    Assessment and Plan: GI Bleed s/p angiogram with embolization to small distal branch of left colic vessel felt to be source for bleeding Some melena reported 2/18-2/19.  1 additional unit of PRBC given with stable HgB.  Heparin was initially resume, however with episode of melena, was held again.  Dr. Earleen Newport was contacted over the weekend for additional angiogram/embolization,  however CT Abdomen Pelvis negative for extravasation and melena small and did not recur.  Hgb 8.7. Patient to resume heparin drip today per CCM.  No further plans in IR at this time.    Electronically Signed: Docia Barrier, PA 10/16/2020, 11:12 AM   I spent a total of 15 Minutes at the the patient's bedside AND on the patient's hospital floor or unit, greater than 50% of which was counseling/coordinating care for GI bleed.

## 2020-10-16 NOTE — Progress Notes (Signed)
Son updated at bedside on plan of care.  Questions answered / support offered.    Noe Gens, MSN, APRN, NP-C, AGACNP-BC [facility] 10/16/2020, 11:17 AM   Please see [email or web address] for pager details.   From 7A-7P if no response, please call [phone] After hours, please call ELink [phone]

## 2020-10-16 NOTE — Progress Notes (Signed)
[facility] Progress Note Patient Name: Angela [name] DOB: Dec 09, 1945 MRN: [id number]   Date of Service  10/16/2020  HPI/Events of Note  Patient requests sleep aid. Xanax not effective.   eICU Interventions  Plan: 1. D/C Xanax Q HS PRN. 2. Melatonin 3 mg PO Q HS PRN Sleep.      Intervention Category Major Interventions: Other:  Lysle Dingwall 10/16/2020, 9:17 PM

## 2020-10-16 NOTE — Progress Notes (Signed)
ANTICOAGULATION CONSULT NOTE - Follow Up Consult  Pharmacy Consult for IV Heparin Indication: atrial fibrillation and Mechanical valve  No Known Allergies  Patient Measurements: Height: 5\' 6"  (167.6 cm) Weight: 59.2 kg (130 lb 8.2 oz) IBW/kg (Calculated) : 59.3 Heparin Dosing Weight: 59.3 kg  Vital Signs: Temp: 98.8 F (37.1 C) (02/21 1522) Temp Source: Oral (02/21 1522) BP: 80/51 (02/21 1300) Pulse Rate: 73 (02/21 1300)  Labs: Recent Labs    10/14/20 0411 10/14/20 1257 10/15/20 0430 10/15/20 1556 10/15/20 2017 10/16/20 0458 10/16/20 1723  HGB 7.7*   < > 8.6* 8.9* 8.9* 8.7*  --   HCT 23.2*   < > 26.2* 27.8* 27.4* 26.0*  --   PLT 54*  --  60*  --   --  80*  --   LABPROT 22.0*  --  20.6*  --   --  18.8*  --   INR 2.0*  --  1.8*  --   --  1.6*  --   HEPARINUNFRC  --   --   --   --   --   --  <0.10*  CREATININE 0.83  --  0.77  --   --  0.79  --    < > = values in this interval not displayed.    Estimated Creatinine Clearance: 55.9 mL/min (by C-G formula based on SCr of 0.79 mg/dL).  Assessment: 75 year old female on Warfarin prior to admission for mechanical valve and atrial fibrillation admitted 2/17 with lower GIB  Heparin level undetectable on 550 units/hr - not unexpected given restarted at low rate due to bleeding. Per RN no BM yet today. No other signs of bleeding.   Goal of Therapy:  Heparin level 0.3-0.7 units/ml Monitor platelets by anticoagulation protocol: Yes   Plan:  Increase heparin to 650 units/hr (~1.6 units/kg increase) - dosing cautiously due to bleeding history Check heparin level at 0300 Daily Heparin level, CBC, and INR.  Follow-up plans to resume Warfarin  Cristela Felt, PharmD Clinical Pharmacist  10/16/2020 6:12 PM

## 2020-10-16 NOTE — Progress Notes (Signed)
NAME:  Angela [name], MRN:  [id number], DOB:  Jul 01, 1946, LOS: 4 ADMISSION DATE:  10/12/2020, CONSULTATION DATE:  10/12/2020 REFERRING MD:  Lorin Mercy, CHIEF COMPLAINT:  Rectal bleeding  Brief History:  75 yo female presented with weakness, chest pain and rectal bleeding.  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  Seen by GI.  IR consulted & patient underwent embolization for bleeding in distal colon.  Transferred to ICU due to persistent hypotension.   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 To IR for embolization.  2/18 Hgb 7.4, one bloody BM overnight  2/19 transfuse PRBC 2/20 Started heparin gtt. Mild abdominal discomfort.  BM with blood clots and dark blood.  Consults:  GI- [place] IR  Procedures:    Significant Diagnostic Tests:   CTA Abd / pelvis 2/17 >> positive for GIB involving the descending colon, presumably diverticular disease  Limited ECHO 2/17 >> LVEF <20%, LV with severely decreased function with global hypokinesis & severely dilated, LA severely dilated, RA severely dilated, mechanical mitral valve (similar ECHO to prior)  CT abd/pelvis 2/19 >> nodularity of liver ?cirrhosis, cholelithiasis, marked hydronephrosis mildly progressed since 2020, Lt colon diverticulosis, small Rt groin hernia, atherosclerosis  Micro Data:  COVID 2/17 >> negative Flu 2/17 >> negative  Antimicrobials:    Interim History / Subjective:  Afebrile  RN reports no BM overnight or this am.  Heparin gtt stopped yesterday (2/20 at 2pm) for bleeding Pt reports mild lower  abdominal cramping   Objective   Blood pressure 103/61, pulse 75, temperature 98.9 F (37.2 C), temperature source Oral, resp. rate (!) 21, height 5\' 6"  (1.676 m), weight 59.2 kg, SpO2 98 %.        Intake/Output Summary (Last 24 hours) at 10/16/2020 0818 Last data filed at 10/16/2020 0600 Gross per 24 hour  Intake 2160.69 ml  Output 1030 ml  Net 1130.69 ml   Filed Weights   10/14/20 0414 10/15/20 0437 10/16/20 0457  Weight: 59.3 kg 59.7 kg 59.2 kg   Examination: General: elderly adult female lying in bed in NAD  HEENT: MM pink/moist, dentures in place, anicteric  Neuro: pleasant, oriented to self / location, per son not oriented to time at baseline, MAE CV: s1s2 RRR, mechanical heart valve sounds, no m/r/g PULM: non-labored at rest, lungs bilaterally clear GI: soft, bsx4 active  Extremities: warm/dry, no edema  Skin: no rashes or lesions   Resolved Hospital Problem list   AKI from ATN 2nd to hypotension, Hypomagnesemia, Nausea with vomiting, Hyperglycemia from acute stress  Assessment & Plan:   Diverticular bleeding with hemorrhagic shock Possible changes of cirrhosis on CT abd S/p embolization by IR on 2/17. Repeat CT ABD/pelvis 2/19 unrevealing  -appreciate GI / IR assistance with patient care  -last bloody BM 2/20  -wean neo to off for MAP >90  Acute blood loss anemia -follow CBC  -transfuse for Hgb <8% in setting of significant cardiac disease, depressed EF   Hx of A fib, mechanical MVR -at risk CVA off anticoagulation with hx of mechanical valve  -challenge again with heparin gtt 2/21 and monitor  for further changes in Hgb   Hx of non ischemic CM with chronic systolic CHF, SSS, VF s/p ICD -continue amiodarone, digoxin   Chronic hypotension -midodrine 10mg  TID   Thrombocytopenia Suspect in setting of consumption -follow CBC   Hx of mild dementia, chronic pain, anxiety with chronic benzo use -PRN xanax, morphine  -delirium prevention  precautions  Hypokalemia Chronic Rt hydronephrosis -Trend BMP / urinary output -Replace electrolytes as indicated -Avoid nephrotoxic agents, ensure adequate renal perfusion  Best practice (evaluated daily)  Diet: regular DVT prophylaxis: heparin gtt GI prophylaxis: protonix Mobility: as tolerated Disposition: ICU Family: will update son on arrival to ICU  Goals of Care:  Last date of multidisciplinary goals of care discussion: Family and staff present:  Summary of discussion:  Follow up goals of care discussion due:  Code Status: Full.    Labs    CMP Latest Ref Rng & Units 10/16/2020 10/15/2020 10/14/2020  Glucose 70 - 99 mg/dL 96 83 123(H)  BUN 8 - 23 mg/dL 8 8 12   Creatinine 0.44 - 1.00 mg/dL 0.79 0.77 0.83  Sodium 135 - 145 mmol/L 136 138 137  Potassium 3.5 - 5.1 mmol/L 3.7 3.2(L) 3.5  Chloride 98 - 111 mmol/L 108 107 110  CO2 22 - 32 mmol/L 21(L) 22 22  Calcium 8.9 - 10.3 mg/dL 7.6(L) 7.8(L) 7.5(L)  Total Protein 6.5 - 8.1 g/dL 4.5(L) - -  Total Bilirubin 0.3 - 1.2 mg/dL 1.0 - -  Alkaline Phos 38 - 126 U/L 38 - -  AST 15 - 41 U/L 14(L) - -  ALT 0 - 44 U/L 15 - -    CBC Latest Ref Rng & Units 10/16/2020 10/15/2020 10/15/2020  WBC 4.0 - 10.5 K/uL 10.5 - -  Hemoglobin 12.0 - 15.0 g/dL 8.7(L) 8.9(L) 8.9(L)  Hematocrit 36.0 - 46.0 % 26.0(L) 27.4(L) 27.8(L)  Platelets 150 - 400 K/uL 80(L) - -    Critical care time: 32 minutes   Noe Gens, MSN, APRN, NP-C, AGACNP-BC [facility] 10/16/2020, 8:18 AM   Please see [email or web address] for pager details.   From 7A-7P if no response, please call [phone] After hours, please call ELink [phone]

## 2020-10-17 DIAGNOSIS — K922 Gastrointestinal hemorrhage, unspecified: Secondary | ICD-10-CM

## 2020-10-17 DIAGNOSIS — I482 Chronic atrial fibrillation, unspecified: Secondary | ICD-10-CM | POA: Diagnosis not present

## 2020-10-17 DIAGNOSIS — R413 Other amnesia: Secondary | ICD-10-CM | POA: Diagnosis not present

## 2020-10-17 DIAGNOSIS — R578 Other shock: Secondary | ICD-10-CM | POA: Diagnosis not present

## 2020-10-17 HISTORY — DX: Gastrointestinal hemorrhage, unspecified: K92.2

## 2020-10-17 LAB — BASIC METABOLIC PANEL
Anion gap: 6 (ref 5–15)
BUN: 11 mg/dL (ref 8–23)
CO2: 23 mmol/L (ref 22–32)
Calcium: 8.2 mg/dL — ABNORMAL LOW (ref 8.9–10.3)
Chloride: 106 mmol/L (ref 98–111)
Creatinine, Ser: 0.83 mg/dL (ref 0.44–1.00)
GFR, Estimated: >60 mL/min (ref >60)
Glucose, Bld: 105 mg/dL — ABNORMAL HIGH (ref 70–99)
Potassium: 3.7 mmol/L (ref 3.5–5.1)
Sodium: 135 mmol/L (ref 135–145)

## 2020-10-17 LAB — PROTIME-INR
INR: 1.5 — ABNORMAL HIGH (ref 0.8–1.2)
Prothrombin Time: 17.1 seconds — ABNORMAL HIGH (ref 11.4–15.2)

## 2020-10-17 LAB — GLUCOSE, CAPILLARY
Glucose-Capillary: 83 mg/dL (ref 70–99)
Glucose-Capillary: 84 mg/dL (ref 70–99)

## 2020-10-17 LAB — CBC
HCT: 26.5 % — ABNORMAL LOW (ref 36.0–46.0)
Hemoglobin: 8.6 g/dL — ABNORMAL LOW (ref 12.0–15.0)
MCH: 31 pg (ref 26.0–34.0)
MCHC: 32.5 g/dL (ref 30.0–36.0)
MCV: 95.7 fL (ref 80.0–100.0)
Platelets: 97 10*3/uL — ABNORMAL LOW (ref 150–400)
RBC: 2.77 MIL/uL — ABNORMAL LOW (ref 3.87–5.11)
RDW: 16.5 % — ABNORMAL HIGH (ref 11.5–15.5)
WBC: 8.5 10*3/uL (ref 4.0–10.5)
nRBC: 0 % (ref 0.0–0.2)

## 2020-10-17 LAB — MAGNESIUM: Magnesium: 2 mg/dL (ref 1.7–2.4)

## 2020-10-17 LAB — HEPARIN LEVEL (UNFRACTIONATED)
Heparin Unfractionated: 0.16 IU/mL — ABNORMAL LOW (ref 0.30–0.70)
Heparin Unfractionated: 0.31 IU/mL (ref 0.30–0.70)

## 2020-10-17 MED ORDER — MORPHINE SULFATE (PF) 2 MG/ML IV SOLN
1.0000 mg | INTRAVENOUS | Status: DC | PRN
  Administered 2020-10-25: 1 mg via INTRAVENOUS
  Filled 2020-10-17: qty 1

## 2020-10-17 NOTE — TOC Progression Note (Addendum)
Transition of Care [facility]) - Progression Note    Patient Details  Name: Angela [name] MRN: [id number] Date of Birth: 07/09/1946  Transition of Care [facility]) CM/SW Contact  Zenon Mayo, RN Phone Number: 10/17/2020, 3:19 PM  Clinical Narrative:    NCM received information from [place] CSW that patient and her son would like for her to come home with home health, even though she is not ambulating well.  She has a family member with her at all times.  They prefer [facility].  NCM made referral to [facility] with [facility] for [place], [place], [place] and [place]. Awaiting call back. Per Ramond Marrow she is not able to take due to staffing,.  Cory with [facility] is able to take referral. Soc will begin on Monday next week.   Expected Discharge Plan: [place] Barriers to Discharge: Continued Medical Work up  Expected Discharge Plan and Services Expected Discharge Plan: [place] arrangements for the past 2 months: Single Family Home                                       Social Determinants of Health (SDOH) Interventions    Readmission Risk Interventions Readmission Risk Prevention Plan 10/13/2019 01/19/2019 12/05/2018  Transportation Screening Complete Complete -  PCP or Specialist Appt within 3-5 Days - - -  HRI or [place] - Complete Complete  Social Work Consult for [place] Planning/Counseling - Complete Complete  Palliative Care Screening - Complete -  Medication Review Press photographer) Complete Complete -  [place] or Home Care Consult Complete - -  SW Recovery Care/Counseling Consult Complete - -  Palliative Care Screening Not Applicable - -  [place] Not Applicable - -  Some recent data might be hidden

## 2020-10-17 NOTE — Progress Notes (Signed)
[place] Progress Note Patient Name: Mekenzie Modeste DOB: 1945-09-12 MRN: [id number]   Date of Service  10/17/2020  HPI/Events of Note  Urinary retention - Bladder scan residual > 500 mL.  eICU Interventions  Plan: 1. I/O cath PRN.     Intervention Category Major Interventions: Other:  Kinjal Neitzke Cornelia Copa 10/17/2020, 4:11 AM

## 2020-10-17 NOTE — Progress Notes (Addendum)
NAME:  Angela [name], MRN:  [id number], DOB:  1945/09/27, LOS: 5 ADMISSION DATE:  10/12/2020, CONSULTATION DATE:  10/12/2020 REFERRING MD:  Lorin Mercy, CHIEF COMPLAINT:  Rectal bleeding  Brief History:  75 yo female presented with weakness, chest pain and rectal bleeding.  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  Seen by GI.  IR consulted & patient underwent embolization for bleeding in distal colon.  Transferred to ICU due to persistent hypotension.   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 To IR for embolization.  2/18 Hgb 7.4, one bloody BM overnight  2/19 transfuse PRBC 2/20 Started heparin gtt. Mild abdominal discomfort.  BM with blood clots and dark blood. 2/21 No further BM, heparin restarted 2/22 Remains on heparin, no bleeding overnight. Off pressors with addition of steroids.   Consults:  GI- [place] IR  Procedures:    Significant Diagnostic Tests:   CTA Abd / pelvis 2/17 >> positive for GIB involving the descending colon, presumably diverticular disease  Limited ECHO 2/17 >> LVEF <20%, LV with severely decreased function with global hypokinesis & severely dilated, LA severely dilated, RA severely dilated, mechanical mitral valve (similar ECHO to prior)  CT abd/pelvis 2/19 >> nodularity of liver ?cirrhosis, cholelithiasis, marked hydronephrosis mildly progressed since 2020, Lt colon diverticulosis, small Rt groin hernia, atherosclerosis  Micro Data:  COVID 2/17 >> negative Flu 2/17 >> negative  Antimicrobials:    Interim History / Subjective:   Afebrile  Glucose range 83-132 I/O 841ml UOP, +380 in last 24 hours  RN reports pt required I/O cath overnight.  Foley remains out but pt has not voided. No bleeding overnight.   Objective   Blood pressure 129/78, pulse 74, temperature 98.9 F (37.2 C), temperature source Oral, resp. rate 16, height 5\' 6"  (1.676 m), weight 60.3 kg, SpO2 99 %.        Intake/Output Summary (Last 24 hours) at 10/17/2020 4580 Last data filed at 10/17/2020 0800 Gross per 24 hour  Intake 1047.7 ml  Output 535 ml  Net 512.7 ml   Filed Weights   10/15/20 0437 10/16/20 0457 10/17/20 0452  Weight: 59.7 kg 59.2 kg 60.3 kg   Examination: General: elderly adult female lying in bed in NAD, eating breakfast  HEENT: MM pink/moist, no jvd, dentures in place, anicteric  Neuro: Awake/alert, oriented to self, place, events - not time (baseline), MAE, feeding self CV: s1s2 RRR with PVC's on monitor, no m/r/g PULM: non-labored on RA, lungs clear bilaterally  GI: soft, bsx4 active  Extremities: warm/dry, no edema  Skin: no rashes or lesions  Resolved Hospital Problem list   AKI from ATN 2nd to hypotension, Hypomagnesemia, Nausea with vomiting, Hyperglycemia from acute stress  Assessment & Plan:   Diverticular bleeding with hemorrhagic shock Possible changes of cirrhosis on CT abd S/p embolization by IR on 2/17. Repeat CT ABD/pelvis 2/19 unrevealing  -appreciate IR / GI assistance with patient care  -if rebleeding occurs > would discuss with IR repeat attempt at coil embolization vs flexible sigmoidoscopy  -last bloody BM 2/20, monitor for further bleeding   Acute blood loss anemia -  trend CBC  -transfuse for Hgb <8% in setting of significant cardiac disease, depressed EF, initial troponin leak  Hx of A fib, mechanical MVR -at risk CVA off anticoagulation with mechanical MV  -re-challenge of heparin initiated 2/21, no further bleeding or change in Hgb  -follow Hgb closely   Hx of non ischemic CM with  chronic systolic CHF, SSS, VF s/p ICD -continue amiodarone, digoxin   Chronic hypotension -midodrine 10 mg TID  -received 1 dose 50 mg hydrocortisone 2/21  Thrombocytopenia Suspect in setting of consumption -follow CBC   Hx of mild dementia, chronic pain, anxiety with chronic benzo use -PRN xanax, morphine  -delirium prevention precautions  Hypokalemia Chronic Rt hydronephrosis -Trend BMP / urinary output -Replace electrolytes as indicated -Avoid nephrotoxic agents, ensure adequate renal perfusion  Urinary Retention  Foley removed 2/21, previously reinserted due to inability to void.  -monitor UOP -attempt to leave out if able -I/O if needed   Best practice (evaluated daily)  Diet: regular DVT prophylaxis: heparin gtt GI prophylaxis: protonix Mobility: as tolerated Disposition: Transfer to PCU, to [facility] as of 2/23. Family: Mosetta Anis, updated via phone 2/22 on plan of care   Goals of Care:  Last date of multidisciplinary goals of care discussion: Family and staff present:  Summary of discussion:  Follow up goals of care discussion due:  Code Status: Full Code.    Labs    CMP Latest Ref Rng & Units 10/17/2020 10/16/2020 10/15/2020  Glucose 70 - 99 mg/dL 105(H) 96 83  BUN 8 - 23 mg/dL 11 8 8   Creatinine 0.44 - 1.00 mg/dL 0.83 0.79 0.77  Sodium 135 - 145 mmol/L 135 136 138  Potassium 3.5 - 5.1 mmol/L 3.7 3.7 3.2(L)  Chloride 98 - 111 mmol/L 106 108 107  CO2 22 - 32 mmol/L 23 21(L) 22  Calcium 8.9 - 10.3 mg/dL 8.2(L) 7.6(L) 7.8(L)  Total Protein 6.5 - 8.1 g/dL - 4.5(L) -  Total Bilirubin 0.3 - 1.2 mg/dL - 1.0 -  Alkaline Phos 38 - 126 U/L - 38 -  AST 15 - 41 U/L - 14(L) -  ALT 0 - 44 U/L - 15 -    CBC Latest Ref Rng & Units 10/17/2020 10/16/2020 10/15/2020  WBC 4.0 - 10.5 K/uL 8.5 10.5 -  Hemoglobin 12.0 - 15.0 g/dL 8.6(L) 8.7(L) 8.9(L)  Hematocrit 36.0 - 46.0 % 26.5(L) 26.0(L) 27.4(L)  Platelets 150 - 400 K/uL 97(L) 80(L) -        Noe Gens, MSN, APRN,  NP-C, AGACNP-BC  [facility] 10/17/2020, 8:28 AM   Please see [email or web address] for pager details.   From 7A-7P if no response, please call [phone] After hours, please call ELink [phone]

## 2020-10-17 NOTE — Progress Notes (Signed)
Daily Rounding Note  10/17/2020, 2:14 PM  LOS: 5 days   SUBJECTIVE:   Chief complaint: Diverticular bleed. No complaints.  Last bowel movement was 2/20.  Smear of stool seen on bed pad is brown today. Urinary retention requiring in and out cath overnight, may need to be repeated and Foley may need to be placed.  OBJECTIVE:         Vital signs in last 24 hours:    Temp:  [98 F (36.7 C)-98.9 F (37.2 C)] 98.8 F (37.1 C) (02/22 1200) Pulse Rate:  [69-77] 73 (02/22 1300) Resp:  [12-25] 18 (02/22 1330) BP: (73-129)/(43-78) 96/56 (02/22 1300) SpO2:  [96 %-100 %] 100 % (02/22 1300) Weight:  [60.3 kg-63.1 kg] 63.1 kg (02/22 1400) Last BM Date: 10/15/20 Filed Weights   10/16/20 0457 10/17/20 0452 10/17/20 1400  Weight: 59.2 kg 60.3 kg 63.1 kg   General: Frail, comfortable. Heart: RRR. Chest: No labored breathing. Abdomen: Soft, active bowel sounds.  Persistent mild tenderness bilaterally. Extremities: No CCE. Neuro/Psych: Alert.  Follows commands.  Not fully oriented.  Intake/Output from previous day: 02/21 0701 - 02/22 0700 In: 1190.2 [P.O.:600; I.V.:540.1; IV Piggyback:50.1] Out: 810 [Urine:810]  Intake/Output this shift: Total I/O In: 452 [P.O.:320; I.V.:132] Out: -   Lab Results: Recent Labs    10/15/20 0430 10/15/20 1556 10/15/20 2017 10/16/20 0458 10/17/20 0443  WBC 16.0*  --   --  10.5 8.5  HGB 8.6*   < > 8.9* 8.7* 8.6*  HCT 26.2*   < > 27.4* 26.0* 26.5*  PLT 60*  --   --  80* 97*   < > = values in this interval not displayed.   BMET Recent Labs    10/15/20 0430 10/16/20 0458 10/17/20 0443  NA 138 136 135  K 3.2* 3.7 3.7  CL 107 108 106  CO2 22 21* 23  GLUCOSE 83 96 105*  BUN 8 8 11   CREATININE 0.77 0.79 0.83  CALCIUM 7.8* 7.6* 8.2*   LFT Recent Labs    10/16/20 0458  PROT 4.5*  ALBUMIN 2.2*  AST 14*  ALT 15  ALKPHOS 38  BILITOT 1.0   PT/INR Recent Labs    10/16/20 0458  10/17/20 0443  LABPROT 18.8* 17.1*  INR 1.6* 1.5*   Hepatitis Panel No results for input(s): HEPBSAG, HCVAB, HEPAIGM, HEPBIGM in the last 72 hours.  Studies/Results: No results found.   Scheduled Meds: . amiodarone  200 mg Oral Daily  . Chlorhexidine Gluconate Cloth  6 each Topical Daily  . digoxin  125 mcg Oral Daily  . feeding supplement  1 Container Oral TID BM  . midodrine  10 mg Oral TID WC  . pantoprazole  40 mg Oral BID  . sodium chloride flush  10-40 mL Intracatheter Q12H   Continuous Infusions: . sodium chloride 10 mL/hr at 10/17/20 1300  . heparin 750 Units/hr (10/17/20 1300)   PRN Meds:.acetaminophen **OR** acetaminophen, melatonin, morphine injection, ondansetron **OR** ondansetron (ZOFRAN) IV, polyethylene glycol, sodium chloride flush   ASSESMENT:   *   Diverticular bleed w hemorrhagic shock.  2/17 embolization of small distal branch of left colic vessel. Last maroon stool 2/19 and 2/20 in setting of IV heparin. Bil,  Non-severe abdominal pain pre and post embolization.   Colon cancer with right hemicolectomy 1999.  No recurrent polyps on colonoscopies 2003, 2004.  Virtual colonoscopy 04/2014 with diverticulosis.   *   Blood loss anemia on top  of anemia of chronic disease.  Hgb stable, 8.6, nadir 6.5. Baseline ~ 11.  S/p 5 PRBCs2/17 - 2/19.    *   Mechanical MVR.  Chronic Coumadin.  INR 1.6, down from max of 4.9 five d ago.  Reversed with FFP and vitamin K Heparin remains in place.    *   Possible cirrhosis by CT scan.  *    Thrombocytopenia.  Platelet nadir 54K, 97K today..  Intermittent issue dating back several years.  *   R hydronephrosis.  Nonobstructive right nephrolithiasis.  *    Chronic hypotension.  On  midodrine chronically.  *   Nonischemic CM.  EF < 20%    PLAN   *   GI signing off.  Call or secure chat if GI re-involvement needed.  No need for outpt GI fup.      Azucena Freed  10/17/2020, 2:14 PM Phone [phone]

## 2020-10-17 NOTE — Progress Notes (Signed)
Call [facility] to evaluated wound to sacrum.

## 2020-10-17 NOTE — Progress Notes (Signed)
ANTICOAGULATION CONSULT NOTE - Follow Up Consult  Pharmacy Consult for IV Heparin Indication: atrial fibrillation and Mechanical valve  Labs: Recent Labs    10/15/20 0430 10/15/20 1556 10/15/20 2017 10/16/20 0458 10/16/20 1723 10/17/20 0300  HGB 8.6* 8.9* 8.9* 8.7*  --   --   HCT 26.2* 27.8* 27.4* 26.0*  --   --   PLT 60*  --   --  80*  --   --   LABPROT 20.6*  --   --  18.8*  --   --   INR 1.8*  --   --  1.6*  --   --   HEPARINUNFRC  --   --   --   --  <0.10* 0.16*  CREATININE 0.77  --   --  0.79  --   --    Assessment: 75 year old female on Warfarin prior to admission for mechanical valve and atrial fibrillation admitted 2/17 with lower GIB  Heparin level 0.16 units/hr. No other signs of bleeding.   Goal of Therapy:  Heparin level 0.3-0.7 units/ml Monitor platelets by anticoagulation protocol: Yes   Plan:  Increase heparin to 750 units/hr - dosing cautiously due to bleeding history Check heparin level at 1200 Daily Heparin level, CBC, and INR.  Follow-up plans to resume Warfarin Thanks for allowing pharmacy to be a part of this patient's care.  Excell Seltzer, PharmD Clinical Pharmacist 10/17/2020 4:43 AM

## 2020-10-17 NOTE — Plan of Care (Signed)
  Problem: Education: Goal: Knowledge of General Education information will improve Description: Including pain rating scale, medication(s)/side effects and non-pharmacologic comfort measures Outcome: Progressing   Problem: Health Behavior/Discharge Planning: Goal: Ability to manage health-related needs will improve Outcome: Progressing   Problem: Elimination: Goal: Will not experience complications related to bowel motility Outcome: Progressing   Problem: Safety: Goal: Ability to remain free from injury will improve Outcome: Progressing   

## 2020-10-17 NOTE — Progress Notes (Signed)
Physical Therapy Treatment Patient Details Name: Angela [name] MRN: [id number] DOB: 11-04-1945 Today's Date: 10/17/2020    History of Present Illness 75 y.o. female with medical history significant of chronic systolic CHF with AICD/pacemaker placement; rheumatic heart disease s/p MVR; dementia; colon cancer (1993); iliac artery embolus s/p embolectomy; CVA; and afib on Coumadin presented 10/12/20 with lower GI bleeding. Elevated INR given vitamin K; hypotensive, given 2 units PRBC plus fluids; 2/17 embolization via left groin access    PT Comments    Patient continues with tendency for posterior bias in sitting and standing. Initially focused on forward flexion at hips in sitting, progressed to initiating sit to stand with forward wt-shift and lifting buttocks off bed, progressing to sit to stand from EOB x4, and finally to stand-pivot to pt's left to recliner. LE exercises (especially stretching shortened left heelcord) in sitting.     Follow Up Recommendations  SNF;Supervision/Assistance - 24 hour     Equipment Recommendations  Other (comment) (TBD)    Recommendations for Other Services OT consult     Precautions / Restrictions Precautions Precautions: Fall Precaution Comments: denies falls at home Restrictions Weight Bearing Restrictions: No    Mobility  Bed Mobility Overal bed mobility: Needs Assistance Bed Mobility: Supine to Sit     Supine to sit: Mod assist;HOB elevated     General bed mobility comments: bed mobility complicated by multiple lines; pt hesitant and anxious re: lines; assist to move legs and manage torso    Transfers Overall transfer level: Needs assistance Equipment used: 1 person hand held assist Transfers: Sit to/from Stand;Stand Pivot Transfers Sit to Stand: Min assist Stand pivot transfers: Mod assist       General transfer comment: pre-transfer training for anterior wt-shift over BOS (reaching to her feet, chair with armrests placed in front  of her and practiced initiating sit to stand with hands on armrests); after this pt able to come to stand with min assist holding onto PTs forearms standing and facing pt; pivotal steps small and shuffling  Ambulation/Gait             General Gait Details: unable +1 assist   Stairs             Wheelchair Mobility    Modified Rankin (Stroke Patients Only)       Balance Overall balance assessment: Needs assistance Sitting-balance support: Feet unsupported;No upper extremity supported Sitting balance-Leahy Scale: Fair     Standing balance support: Bilateral upper extremity supported Standing balance-Leahy Scale: Poor Standing balance comment: posterior lean               High Level Balance Comments: Assist from front            Cognition Arousal/Alertness: Awake/alert Behavior During Therapy: WFL for tasks assessed/performed;Flat affect Overall Cognitive Status: No family/caregiver present to determine baseline cognitive functioning                                 General Comments: Pt appears to be [facility]      Exercises General Exercises - Lower Extremity Ankle Circles/Pumps: AROM;10 reps Hip Flexion/Marching: AROM;Both;20 reps;Seated Other Exercises Other Exercises: seated left heel cord stretch due to PF tightness; able to achieve neutral DF with knee flexed to 90 in sitting    General Comments General comments (skin integrity, edema, etc.): VSS per monitor in ICU      Pertinent Vitals/Pain Pain Assessment: Faces Faces  Pain Scale: No hurt    Home Living                      Prior Function            PT Goals (current goals can now be found in the care plan section) Acute Rehab PT Goals Patient Stated Goal: get moving better so she can return home Time For Goal Achievement: 10/29/20 Potential to Achieve Goals: Fair Progress towards PT goals: Progressing toward goals    Frequency    Min 3X/week (until discharge  plan confirmed)      PT Plan Current plan remains appropriate    Co-evaluation              AM-PAC PT "6 Clicks" Mobility   Outcome Measure  Help needed turning from your back to your side while in a flat bed without using bedrails?: A Lot Help needed moving from lying on your back to sitting on the side of a flat bed without using bedrails?: A Lot Help needed moving to and from a bed to a chair (including a wheelchair)?: A Lot Help needed standing up from a chair using your arms (e.g., wheelchair or bedside chair)?: A Lot Help needed to walk in hospital room?: Total Help needed climbing 3-5 steps with a railing? : Total 6 Click Score: 10    End of Session Equipment Utilized During Treatment: Gait belt Activity Tolerance: Patient limited by fatigue Patient left: with call bell/phone within reach;in chair;with chair alarm set Nurse Communication: Mobility status PT Visit Diagnosis: Unsteadiness on feet (R26.81);Muscle weakness (generalized) (M62.81);Difficulty in walking, not elsewhere classified (R26.2)     Time: 1034-1100 PT Time Calculation (min) (ACUTE ONLY): 26 min  Charges:  $Gait Training: 23-37 mins                      Arby Barrette, PT Pager [phone]    Rexanne Mano 10/17/2020, 11:15 AM

## 2020-10-17 NOTE — Progress Notes (Signed)
ANTICOAGULATION CONSULT NOTE - Initial Consult  Pharmacy Consult for IV Heparin Indication: atrial fibrillation and Mechanical valve  No Known Allergies  Patient Measurements: Height: 5\' 6"  (167.6 cm) Weight: 60.3 kg (132 lb 15 oz) IBW/kg (Calculated) : 59.3 Heparin Dosing Weight: 59.3 kg  Vital Signs: Temp: 98.8 F (37.1 C) (02/22 1200) Temp Source: Oral (02/22 1200) BP: 106/52 (02/22 1200) Pulse Rate: 76 (02/22 1200)  Labs: Recent Labs    10/15/20 0430 10/15/20 1556 10/15/20 2017 10/16/20 0458 10/16/20 1723 10/17/20 0300 10/17/20 0443 10/17/20 1243  HGB 8.6*   < > 8.9* 8.7*  --   --  8.6*  --   HCT 26.2*   < > 27.4* 26.0*  --   --  26.5*  --   PLT 60*  --   --  80*  --   --  97*  --   LABPROT 20.6*  --   --  18.8*  --   --  17.1*  --   INR 1.8*  --   --  1.6*  --   --  1.5*  --   HEPARINUNFRC  --   --   --   --  <0.10* 0.16*  --  0.31  CREATININE 0.77  --   --  0.79  --   --  0.83  --    < > = values in this interval not displayed.    Estimated Creatinine Clearance: 54 mL/min (by C-G formula based on SCr of 0.83 mg/dL).  Assessment: 74 year old female on Warfarin prior to admission for mechanical valve and atrial fibrillation admitted 2/17 with lower GIB  Cbc remains stable  Hep lvl within goal  Clarified home warfarin dose today - INR was 5.5 on 2/16 and put on hold per MD then got admitted 6 mg Mon Thu, 5 mg AOD  Goal of Therapy:  Heparin level 0.3-0.7 units/ml Monitor platelets by anticoagulation protocol: Yes   Plan:  Continue heparin 750 units/hr Daily Heparin level, CBC, and INR.  Follow-up plans to resume Warfarin.   Barth Kirks, PharmD, BCPS, BCCCP Clinical Pharmacist [phone]  Please check AMION for all [place] numbers  10/17/2020 1:30 PM

## 2020-10-17 NOTE — TOC Initial Note (Signed)
Transition of Care [facility]) - Initial/Assessment Note    Patient Details  Name: Angela [name] MRN: [id number] Date of Birth: 11-Sep-1945  [facility] CM/SW Contact:    Bethann Berkshire, [place] Phone Number: 10/17/2020, 2:36 PM  Clinical Narrative:                  CSW met with pt to discuss SNF recommendation. Pt reports she lives at home with her son. She explains when he is at work she has someone stay with her but it is unclear who this is. She has a walker, cane, and 3 in 1 at home. CSW explains reason for SNF recommendation. Pt expresses a desire to return home but ultimately defers to her son to help with the decision.   CSW called pt's son. Son confirms that he prefers pt to return home to live with her. Pt confirms that pt lives with him and he explains that other family members stay with pt when he is at work. Son confirms there are no other DME needs. CSW explains that pt is not currently walking and only able to transfer with assistance at this point. Son acknowledges this and maintains desire for pt to return home at time of d/c. Son states that pt has had [facility] in the past about 1 year ago. He is agreeable to [facility] and has no preference. TOC will continue to follow for d/c needs and arrange [facility] services.   Expected Discharge Plan: [place] Barriers to Discharge: Continued Medical Work up   Patient Goals and CMS Choice Patient states their goals for this hospitalization and ongoing recovery are:: Pt expressed desire to return home but defferred decision to her son. Son wants pt to return home instead of a SNF   Choice offered to / list presented to : Adult Children  Expected Discharge Plan and Services Expected Discharge Plan: [place]       Living arrangements for the past 2 months: Single Family Home                                      Prior Living Arrangements/Services Living arrangements for the past 2 months:  Single Family Home Lives with:: Adult Children Patient language and need for interpreter reviewed:: Yes Do you feel safe going back to the place where you live?: Yes      Need for Family Participation in Patient Care: Yes (Comment) Care giver support system in place?: Yes (comment) Current home services: DME Criminal Activity/Legal Involvement Pertinent to Current Situation/Hospitalization: No - Comment as needed  Activities of Daily Living      Permission Sought/Granted      Share Information with NAME: Angela [name], Angela [name] (Son)   [phone] (Mobile)           Emotional Assessment Appearance:: Appears stated age Attitude/Demeanor/Rapport: Lethargic Affect (typically observed): Quiet,Pleasant Orientation: : Oriented to Self,Oriented to Place,Oriented to Situation Alcohol / Substance Use: Not Applicable Psych Involvement: No (comment)  Admission diagnosis:  Rectal bleeding [K62.5] GIB (gastrointestinal bleeding) [K92.2] Acute GI bleeding [K92.2] Elevated INR [R79.1] Patient Active Problem List   Diagnosis Date Noted  . Lower abdominal pain   . Acute lower GI hemorrhage 10/12/2020  . Hemorrhagic shock ([place]) 10/12/2020  . GIB (gastrointestinal bleeding) 10/12/2020  . Arterial occlusion   . Dizziness   . Sensation of cold  in leg 10/01/2019  . 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 Acute on chronic combined systolic and diastolic CHF (congestive heart failure) ([place]) 01/22/2019  . Perianal pain   . Acute metabolic encephalopathy 98/33/8250  . UTI (urinary tract infection) 01/14/2019  . Protein-calorie malnutrition, severe 12/08/2018  . Goals of care, counseling/discussion   . 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 Syncope 11/26/2018  . VT (ventricular tachycardia) ([place]) 11/26/2018  . Elevated INR 11/26/2018  . Acute on chronic systolic (congestive) heart failure ([place]) 10/27/2018  . Elevated troponin 10/26/2018  . Anemia 10/26/2018  . Palliative care by  specialist   . Rectal bleeding 04/28/2017  . Acute blood loss anemia 04/28/2017  . ICD (implantable cardioverter-defibrillator), biventricular, in situ 08/24/2014  . Memory deficit 08/23/2014  . Abnormality of gait 08/23/2014  . Dementia ([place]) 12/17/2013  . 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 Hypotension due to drugs 07/12/2012  . Hypokalemia 07/12/2012  . NSVT (nonsustained ventricular tachycardia) ([place]) 07/12/2012  . Acute on chronic systolic heart failure ([place]) 07/07/2012  . Ventricular fibrillation ([place]) 11/11/2011  . Warfarin anticoagulation 09/22/2011  . Nonischemic cardiomyopathy ([place])   . Atrial fibrillation ([place])   . History of colon cancer   . Internal hemorrhoids   . Rheumatic heart disease   . Sick sinus syndrome ([place])   . Chronic combined systolic and diastolic heart failure ([place])   . DIVERTICULAR DISEASE 06/05/2009   PCP:  Lorene Dy, MD Pharmacy:   [facility] [place] [place] [address] Phone: [phone] Fax: [phone]     Social Determinants of Health (SDOH) Interventions    Readmission Risk Interventions Readmission Risk Prevention Plan 10/13/2019 01/19/2019 12/05/2018  Transportation Screening Complete Complete -  PCP or Specialist Appt within 3-5 Days - - -  [place] or [place] - Complete Complete  Social Work Consult for [place] Planning/Counseling - Complete Complete  Palliative Care Screening - Complete -  Medication Review Press photographer) Complete Complete -  [place] or Home Care Consult Complete - -  SW Recovery Care/Counseling Consult Complete - -  Palliative Care Screening Not Applicable - -  [place] Not Applicable - -  Some recent data might be hidden

## 2020-10-17 NOTE — Progress Notes (Signed)
Patient moved to [address] [place], All belonging packed moved with patient and son notified of transfer.

## 2020-10-18 ENCOUNTER — Encounter (HOSPITAL_COMMUNITY): Payer: Self-pay | Admitting: Emergency Medicine

## 2020-10-18 DIAGNOSIS — K922 Gastrointestinal hemorrhage, unspecified: Secondary | ICD-10-CM | POA: Diagnosis not present

## 2020-10-18 LAB — URINALYSIS, ROUTINE W REFLEX MICROSCOPIC
Bilirubin Urine: NEGATIVE
Glucose, UA: NEGATIVE mg/dL
Ketones, ur: NEGATIVE mg/dL
Nitrite: NEGATIVE
Protein, ur: NEGATIVE mg/dL
Specific Gravity, Urine: 1.01 (ref 1.005–1.030)
WBC, UA: >50 WBC/hpf — ABNORMAL HIGH (ref 0–5)
pH: 5 (ref 5.0–8.0)

## 2020-10-18 LAB — HEPARIN LEVEL (UNFRACTIONATED)
Heparin Unfractionated: 0.26 IU/mL — ABNORMAL LOW (ref 0.30–0.70)
Heparin Unfractionated: 0.24 IU/mL — ABNORMAL LOW (ref 0.30–0.70)
Heparin Unfractionated: 0.38 IU/mL (ref 0.30–0.70)

## 2020-10-18 LAB — PROTIME-INR
INR: 1.2 (ref 0.8–1.2)
Prothrombin Time: 14.6 seconds (ref 11.4–15.2)

## 2020-10-18 LAB — CBC
HCT: 26.5 % — ABNORMAL LOW (ref 36.0–46.0)
Hemoglobin: 8.8 g/dL — ABNORMAL LOW (ref 12.0–15.0)
MCH: 31.1 pg (ref 26.0–34.0)
MCHC: 33.2 g/dL (ref 30.0–36.0)
MCV: 93.6 fL (ref 80.0–100.0)
Platelets: 123 10*3/uL — ABNORMAL LOW (ref 150–400)
RBC: 2.83 MIL/uL — ABNORMAL LOW (ref 3.87–5.11)
RDW: 16.9 % — ABNORMAL HIGH (ref 11.5–15.5)
WBC: 8 10*3/uL (ref 4.0–10.5)
nRBC: 0 % (ref 0.0–0.2)

## 2020-10-18 MED ORDER — CHLORHEXIDINE GLUCONATE CLOTH 2 % EX PADS
6.0000 | MEDICATED_PAD | Freq: Every day | CUTANEOUS | Status: DC
  Administered 2020-10-18 – 2020-10-26 (×8): 6 via TOPICAL

## 2020-10-18 NOTE — Progress Notes (Signed)
Patient has blood tinged urine x1  D/T blood in stool, 2nd time patient only urinated-no blood. Will continue to assess and monitor for bleeding.

## 2020-10-18 NOTE — Care Management Important Message (Signed)
Important Message  Patient Details  Name: Angela [name] MRN: [id number] Date of Birth: 1946-04-19   Medicare Important Message Given:  Yes     Shelda Altes 10/18/2020, 10:22 AM

## 2020-10-18 NOTE — Progress Notes (Addendum)
Patient wanted to get up to urinate. Got pt up with 2 assist to [facility], patient urinated 100cc and says she feels like she has to go some more but it won't come out. RN did bladder scan (404cc) and in and out (300cc). Patient has had good output, but may be retaining. Last indwelling catheter was removed on 2/21 and this was the 2nd In and out cath.

## 2020-10-18 NOTE — Progress Notes (Addendum)
Patient has urinated 850ccs of urine so far on this shift (last 5hrs), bladder scan shows 251cc of urine. Will check again and help patient to [facility] as needed.

## 2020-10-18 NOTE — Progress Notes (Signed)
ANTICOAGULATION CONSULT NOTE - Initial Consult  Pharmacy Consult for IV Heparin Indication: atrial fibrillation and Mechanical valve  No Known Allergies  Patient Measurements: Height: 5\' 7"  (170.2 cm) Weight: 64.2 kg (141 lb 8.6 oz) IBW/kg (Calculated) : 61.6 Heparin Dosing Weight: 59.3 kg  Vital Signs: Temp: 98.5 F (36.9 C) (02/23 1300) Temp Source: Oral (02/23 1300) BP: 99/58 (02/23 1300) Pulse Rate: 73 (02/23 1300)  Labs: Recent Labs    10/16/20 0458 10/16/20 1723 10/17/20 0443 10/17/20 1243 10/18/20 0317 10/18/20 1301  HGB 8.7*  --  8.6*  --  8.8*  --   HCT 26.0*  --  26.5*  --  26.5*  --   PLT 80*  --  97*  --  123*  --   LABPROT 18.8*  --  17.1*  --  14.6  --   INR 1.6*  --  1.5*  --  1.2  --   HEPARINUNFRC  --    < >  --  0.31 0.26* 0.24*  CREATININE 0.79  --  0.83  --   --   --    < > = values in this interval not displayed.    Estimated Creatinine Clearance: 56.1 mL/min (by C-G formula based on SCr of 0.83 mg/dL).  Assessment: 75 year old female on Warfarin prior to admission for mechanical valve and atrial fibrillation admitted 2/17 with lower GIB  Cbc remains stable  Clarified home warfarin dose today - INR was 5.5 on 2/16 and put on hold per MD then got admitted 6 mg Mon Thu, 5 mg AOD  Heparin level came back subtherapeutic again this PM. We will increase rate and check another level  Goal of Therapy:  Heparin level 0.3-0.5 Monitor platelets by anticoagulation protocol: Yes   Plan:  Increase heparin 900 units/hr 8 hr HL Daily Heparin level, CBC, and INR.  Follow-up plans to resume Warfarin.   Onnie Boer, PharmD, BCIDP, AAHIVP, CPP Infectious Disease Pharmacist 10/18/2020 1:56 PM

## 2020-10-18 NOTE — Plan of Care (Signed)
  Problem: Activity: Goal: Risk for activity intolerance will decrease Outcome: Progressing   Problem: Elimination: Goal: Will not experience complications related to bowel motility Outcome: Progressing   Problem: Elimination: Goal: Will not experience complications related to urinary retention Outcome: Progressing   

## 2020-10-18 NOTE — Progress Notes (Signed)
[place] for IV Heparin Indication: atrial fibrillation and Mechanical valve  No Known Allergies  Patient Measurements: Height: 5\' 7"  (170.2 cm) Weight: 64.2 kg (141 lb 8.6 oz) IBW/kg (Calculated) : 61.6 Heparin Dosing Weight: 59.3 kg  Vital Signs: Temp: 99 F (37.2 C) (02/23 2000) Temp Source: Oral (02/23 2000) BP: 111/73 (02/23 2000) Pulse Rate: 73 (02/23 2000)  Labs: Recent Labs    10/16/20 0458 10/16/20 1723 10/17/20 0443 10/17/20 1243 10/18/20 0317 10/18/20 1301 10/18/20 2222  HGB 8.7*  --  8.6*  --  8.8*  --   --   HCT 26.0*  --  26.5*  --  26.5*  --   --   PLT 80*  --  97*  --  123*  --   --   LABPROT 18.8*  --  17.1*  --  14.6  --   --   INR 1.6*  --  1.5*  --  1.2  --   --   HEPARINUNFRC  --    < >  --    < > 0.26* 0.24* 0.38  CREATININE 0.79  --  0.83  --   --   --   --    < > = values in this interval not displayed.    Estimated Creatinine Clearance: 56.1 mL/min (by C-G formula based on SCr of 0.83 mg/dL).  Assessment: 75 year old female on Warfarin prior to admission for mechanical valve and atrial fibrillation admitted 2/17 with lower GIB  Cbc remains stable  Clarified home warfarin dose today - INR was 5.5 on 2/16 and put on hold per MD then got admitted 6 mg Mon Thu, 5 mg AOD  2/23 PM update:  Heparin level therapeutic after rate increase  Goal of Therapy:  Heparin level 0.3-0.5 units/mL Monitor platelets by anticoagulation protocol: Yes   Plan:  Continue heparin at 900 units/hr Confirmatory heparin level with AM labs   Narda Bonds, PharmD, [place] Pharmacist Phone: [phone]

## 2020-10-18 NOTE — Progress Notes (Signed)
PROGRESS NOTE  Angela [name] OVF:[id number] DOB: 08-12-1946 DOA: 10/12/2020 PCP: Lorene Dy, MD  HPI/Recap of past 24 hours: 75 yo female presented with weakness, chest pain and rectal bleeding.  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  Seen by GI.  IR consulted & patient underwent embolization for bleeding in distal colon.  Transferred to ICU due to persistent hypotension.   Transferred to [facility] service on 10/18/2020.  10/18/20: Seen and examined at bedside.  She appears anxious.  She complains of abdominal pain diffusely, 7 out of 10.   Assessment/Plan: Principal Problem:   Acute lower GI hemorrhage Active Problems:   Atrial fibrillation (HCC)   Chronic combined systolic and diastolic heart failure (HCC)   Memory deficit   ICD (implantable cardioverter-defibrillator), biventricular, in situ   Hemorrhagic shock (HCC)   GIB (gastrointestinal bleeding)   Lower abdominal pain  Resolved diverticular bleeding with hemorrhagic shock Possible changes of cirrhosis on CT abd S/p embolization by IR on 2/17. Repeat CT ABD/pelvis 2/19 unrevealing  -appreciate IR / GI assistance with patient care  -if rebleeding occurs > would discuss with IR repeat attempt at coil embolization vs flexible sigmoidoscopy  -last bloody BM 2/20, monitor for further bleeding  Hemoglobin is up rising, 8.8 from 8.6.  Abdominal pain, unclear etiology. Pain management in place. Could be spasm If persistent start Bentyl  Acute blood loss anemia -trend CBC  -transfuse for Hgb <8% in setting of significant cardiac disease, depressed EF, initial troponin leak  Hx of A fib, mechanical MVR -at risk CVA off anticoagulation with mechanical MV  -re-challenge of heparin initiated 2/21, no further bleeding or change in Hgb  -follow Hgb closely   Hx of non ischemic CM with chronic systolic CHF, SSS, VF s/p ICD -continue amiodarone, digoxin   Chronic hypotension -midodrine 10 mg TID  -received 1 dose  50 mg hydrocortisone 2/21  Thrombocytopenia Suspect in setting of consumption -follow CBC   Hx of mild dementia, chronic pain, anxiety with chronic benzo use -PRN xanax, morphine  -delirium prevention precautions  Resolved post repletion: Hypokalemia Serum potassium 3.7 Magnesium 2.0.  Chronic Rt hydronephrosis -Trend BMP / urinary output -Replace electrolytes as indicated -Avoid nephrotoxic agents, ensure adequate renal perfusion  Urinary Retention  Foley removed 2/21, previously reinserted due to inability to void.  -monitor UOP -attempt to leave out if able -I/O if needed       Status is: Inpatient    Dispo:  Patient From: Home  Planned Disposition: Home with Health Care Svc  Expected discharge date: 10/20/2020  Medically stable for discharge: No, ongoing management of symptomatology.          Objective: Vitals:   10/18/20 0800 10/18/20 1022 10/18/20 1125 10/18/20 1300  BP: 106/60  118/61 (!) 99/58  Pulse: 73 76 76 73  Resp: 16  18 19   Temp: 98.3 F (36.8 C)  98.6 F (37 C) 98.5 F (36.9 C)  TempSrc: Oral  Oral Oral  SpO2: 98%  95% 100%  Weight:      Height:        Intake/Output Summary (Last 24 hours) at 10/18/2020 1505 Last data filed at 10/18/2020 1428 Gross per 24 hour  Intake 774.82 ml  Output 2250 ml  Net -1475.18 ml   Filed Weights   10/17/20 0452 10/17/20 1400 10/18/20 0017  Weight: 60.3 kg 63.1 kg 64.2 kg    Exam:  . General: 75 y.o. year-old female well developed well nourished in no  acute distress.  Alert and anxious appearing. . Cardiovascular: Regular rate and rhythm with no rubs or gallops.  No thyromegaly or JVD noted.   [name] Respiratory: Clear to auscultation with no wheezes or rales. Good inspiratory effort. . Abdomen: Soft nontender nondistended with normal bowel sounds x4 quadrants. . Musculoskeletal: No lower extremity edema bilaterally. . Skin: No ulcerative lesions noted or rashes . Psychiatry: Mood is appropriate  for condition and setting   Data Reviewed: CBC: Recent Labs  Lab 10/14/20 0411 10/14/20 1257 10/15/20 0430 10/15/20 1556 10/15/20 2017 10/16/20 0458 10/17/20 0443 10/18/20 0317  WBC 22.7*  --  16.0*  --   --  10.5 8.5 8.0  HGB 7.7*   < > 8.6* 8.9* 8.9* 8.7* 8.6* 8.8*  HCT 23.2*   < > 26.2* 27.8* 27.4* 26.0* 26.5* 26.5*  MCV 92.4  --  92.9  --   --  94.2 95.7 93.6  PLT 54*  --  60*  --   --  80* 97* 123*   < > = values in this interval not displayed.   Basic Metabolic Panel: Recent Labs  Lab 10/13/20 0436 10/14/20 0411 10/15/20 0430 10/16/20 0458 10/17/20 0443  NA 139 137 138 136 135  K 4.1 3.5 3.2* 3.7 3.7  CL 111 110 107 108 106  CO2 21* 22 22 21* 23  GLUCOSE 104* 123* 83 96 105*  BUN 23 12 8 8 11   CREATININE 0.89 0.83 0.77 0.79 0.83  CALCIUM 7.2* 7.5* 7.8* 7.6* 8.2*  MG 1.6* 1.8  --  1.5* 2.0  PHOS 2.9  --   --   --   --    GFR: Estimated Creatinine Clearance: 56.1 mL/min (by C-G formula based on SCr of 0.83 mg/dL). Liver Function Tests: Recent Labs  Lab 10/12/20 0303 10/16/20 0458  AST 23 14*  ALT 15 15  ALKPHOS 47 38  BILITOT 0.7 1.0  PROT 5.9* 4.5*  ALBUMIN 3.4* 2.2*   No results for input(s): LIPASE, AMYLASE in the last 168 hours. No results for input(s): AMMONIA in the last 168 hours. Coagulation Profile: Recent Labs  Lab 10/14/20 0411 10/15/20 0430 10/16/20 0458 10/17/20 0443 10/18/20 0317  INR 2.0* 1.8* 1.6* 1.5* 1.2   Cardiac Enzymes: No results for input(s): CKTOTAL, CKMB, CKMBINDEX, TROPONINI in the last 168 hours. BNP (last 3 results) No results for input(s): PROBNP in the last 8760 hours. HbA1C: No results for input(s): HGBA1C in the last 72 hours. CBG: Recent Labs  Lab 10/16/20 0818 10/16/20 1136 10/16/20 1515 10/17/20 0750 10/17/20 1232  GLUCAP 97 90 132* 83 84   Lipid Profile: No results for input(s): CHOL, HDL, LDLCALC, TRIG, CHOLHDL, LDLDIRECT in the last 72 hours. Thyroid Function Tests: No results for input(s):  TSH, T4TOTAL, FREET4, T3FREE, THYROIDAB in the last 72 hours. Anemia Panel: No results for input(s): VITAMINB12, FOLATE, FERRITIN, TIBC, IRON, RETICCTPCT in the last 72 hours. Urine analysis:    Component Value Date/Time   COLORURINE YELLOW 10/13/2020 0637   APPEARANCEUR HAZY (A) 10/13/2020 0637   LABSPEC >1.046 (H) 10/13/2020 0637   PHURINE 5.0 10/13/2020 0637   GLUCOSEU NEGATIVE 10/13/2020 0637   HGBUR SMALL (A) 10/13/2020 0637   BILIRUBINUR NEGATIVE 10/13/2020 0637   KETONESUR NEGATIVE 10/13/2020 0637   PROTEINUR NEGATIVE 10/13/2020 0637   UROBILINOGEN 0.2 10/23/2010 2318   NITRITE NEGATIVE 10/13/2020 0637   LEUKOCYTESUR SMALL (A) 10/13/2020 0637   Sepsis Labs: @LABRCNTIP (procalcitonin:4,lacticidven:4)  ) Recent Results (from the past 240 hour(s))  Resp  Panel by RT-PCR (Flu A&B, Covid) Nasopharyngeal Swab     Status: None   Collection Time: 10/12/20  6:59 AM   Specimen: Nasopharyngeal Swab; Nasopharyngeal(NP) swabs in vial transport medium  Result Value Ref Range Status   SARS Coronavirus 2 by RT PCR NEGATIVE NEGATIVE Final    Comment: (NOTE) SARS-CoV-2 target nucleic acids are NOT DETECTED.  The SARS-CoV-2 RNA is generally detectable in upper respiratory specimens during the acute phase of infection. The lowest concentration of SARS-CoV-2 viral copies this assay can detect is 138 copies/mL. A negative result does not preclude SARS-Cov-2 infection and should not be used as the sole basis for treatment or other patient management decisions. A negative result may occur with  improper specimen collection/handling, submission of specimen other than nasopharyngeal swab, presence of viral mutation(s) within the areas targeted by this assay, and inadequate number of viral copies(<138 copies/mL). A negative result must be combined with clinical observations, patient history, and epidemiological information. The expected result is Negative.  Fact Sheet for Patients:   EntrepreneurPulse.com.au  Fact Sheet for Healthcare Providers:  IncredibleEmployment.be  This test is no t yet approved or cleared by the [place] FDA and  has been authorized for detection and/or diagnosis of SARS-CoV-2 by FDA under an Emergency Use Authorization (EUA). This EUA will remain  in effect (meaning this test can be used) for the duration of the COVID-19 declaration under Section 564(b)(1) of the Act, 21 U.S.C.section 360bbb-3(b)(1), unless the authorization is terminated  or revoked sooner.       Influenza A by PCR NEGATIVE NEGATIVE Final   Influenza B by PCR NEGATIVE NEGATIVE Final    Comment: (NOTE) The Xpert Xpress SARS-CoV-2/FLU/RSV plus assay is intended as an aid in the diagnosis of influenza from Nasopharyngeal swab specimens and should not be used as a sole basis for treatment. Nasal washings and aspirates are unacceptable for Xpert Xpress SARS-CoV-2/FLU/RSV testing.  Fact Sheet for Patients: EntrepreneurPulse.com.au  Fact Sheet for Healthcare Providers: IncredibleEmployment.be  This test is not yet approved or cleared by the [place] FDA and has been authorized for detection and/or diagnosis of SARS-CoV-2 by FDA under an Emergency Use Authorization (EUA). This EUA will remain in effect (meaning this test can be used) for the duration of the COVID-19 declaration under Section 564(b)(1) of the Act, 21 U.S.C. section 360bbb-3(b)(1), unless the authorization is terminated or revoked.  Performed at [facility], [place] [address], [place], [place] [address]   MRSA PCR Screening     Status: None   Collection Time: 10/13/20  7:57 AM   Specimen: Nasopharyngeal  Result Value Ref Range Status   MRSA by PCR NEGATIVE NEGATIVE Final    Comment:        The GeneXpert MRSA Assay (FDA approved for NASAL specimens only), is one component of a comprehensive MRSA  colonization surveillance program. It is not intended to diagnose MRSA infection nor to guide or monitor treatment for MRSA infections. Performed at [facility], [place] [address] [place], [place] [address]       Studies: No results found.  Scheduled Meds: . amiodarone  200 mg Oral Daily  . Chlorhexidine Gluconate Cloth  6 each Topical Daily  . digoxin  125 mcg Oral Daily  . feeding supplement  1 Container Oral TID BM  . midodrine  10 mg Oral TID WC  . pantoprazole  40 mg Oral BID  . sodium chloride flush  10-40 mL Intracatheter Q12H  Continuous Infusions: . sodium chloride Stopped (10/17/20 1342)  . heparin 900 Units/hr (10/18/20 1428)     LOS: 6 days     Kayleen Memos, MD [facility] Pager [phone]  If 7PM-7AM, please contact night-coverage [email or web address] Password [facility] 10/18/2020, 3:05 PM

## 2020-10-18 NOTE — Progress Notes (Signed)
ANTICOAGULATION CONSULT NOTE - Follow Up Consult  Pharmacy Consult for heparin Indication: Afib/MVR  Labs: Recent Labs    10/16/20 0458 10/16/20 1723 10/17/20 0300 10/17/20 0443 10/17/20 1243 10/18/20 0317  HGB 8.7*  --   --  8.6*  --  8.8*  HCT 26.0*  --   --  26.5*  --  26.5*  PLT 80*  --   --  97*  --  123*  LABPROT 18.8*  --   --  17.1*  --  14.6  INR 1.6*  --   --  1.5*  --  1.2  HEPARINUNFRC  --    < > 0.16*  --  0.31 0.26*  CREATININE 0.79  --   --  0.83  --   --    < > = values in this interval not displayed.    Assessment: 75yo female subtherapeutic on heparin after one level at lower end of goal; no gtt issues per RN but she does note a small amount of blood in urine and BM; Hgb low but stable.  Goal of Therapy:  Heparin level 0.3-0.7 units/ml   Plan:  Will increase heparin gtt by <1 unit/kg/hr to 800 units/hr and check level in 8 hours.    Wynona Neat, PharmD, BCPS  10/18/2020,4:30 AM

## 2020-10-19 DIAGNOSIS — K922 Gastrointestinal hemorrhage, unspecified: Secondary | ICD-10-CM | POA: Diagnosis not present

## 2020-10-19 LAB — CBC
HCT: 26.2 % — ABNORMAL LOW (ref 36.0–46.0)
Hemoglobin: 8.7 g/dL — ABNORMAL LOW (ref 12.0–15.0)
MCH: 31.2 pg (ref 26.0–34.0)
MCHC: 33.2 g/dL (ref 30.0–36.0)
MCV: 93.9 fL (ref 80.0–100.0)
Platelets: 152 10*3/uL (ref 150–400)
RBC: 2.79 MIL/uL — ABNORMAL LOW (ref 3.87–5.11)
RDW: 16.6 % — ABNORMAL HIGH (ref 11.5–15.5)
WBC: 8.1 10*3/uL (ref 4.0–10.5)
nRBC: 0 % (ref 0.0–0.2)

## 2020-10-19 LAB — PROTIME-INR
INR: 1.2 (ref 0.8–1.2)
Prothrombin Time: 14.5 seconds (ref 11.4–15.2)

## 2020-10-19 LAB — HEPARIN LEVEL (UNFRACTIONATED): Heparin Unfractionated: 0.45 IU/mL (ref 0.30–0.70)

## 2020-10-19 MED ORDER — SODIUM CHLORIDE 0.9 % IV SOLN
1.0000 g | INTRAVENOUS | Status: DC
  Administered 2020-10-19 – 2020-10-21 (×3): 1 g via INTRAVENOUS
  Filled 2020-10-19 (×3): qty 10

## 2020-10-19 MED ORDER — WARFARIN - PHARMACIST DOSING INPATIENT: Freq: Every day | Status: DC

## 2020-10-19 MED ORDER — WARFARIN SODIUM 3 MG PO TABS
3.0000 mg | ORAL_TABLET | Freq: Once | ORAL | Status: AC
  Administered 2020-10-19: 3 mg via ORAL
  Filled 2020-10-19: qty 1

## 2020-10-19 NOTE — Progress Notes (Addendum)
ANTICOAGULATION CONSULT NOTE - Initial Consult  Pharmacy Consult for IV Heparin Indication: atrial fibrillation and Mechanical valve  No Known Allergies  Patient Measurements: Height: 5\' 7"  (170.2 cm) Weight: 61.7 kg (136 lb 0.4 oz) IBW/kg (Calculated) : 61.6 Heparin Dosing Weight: 59.3 kg  Vital Signs: Temp: 99.1 F (37.3 C) (02/24 0400) Temp Source: Oral (02/24 0400) BP: 112/62 (02/24 0400) Pulse Rate: 73 (02/24 0200)  Labs: Recent Labs    10/17/20 0443 10/17/20 1243 10/18/20 0317 10/18/20 1301 10/18/20 2222 10/19/20 0325  HGB 8.6*  --  8.8*  --   --  8.7*  HCT 26.5*  --  26.5*  --   --  26.2*  PLT 97*  --  123*  --   --  152  LABPROT 17.1*  --  14.6  --   --  14.5  INR 1.5*  --  1.2  --   --  1.2  HEPARINUNFRC  --    < > 0.26* 0.24* 0.38 0.45  CREATININE 0.83  --   --   --   --   --    < > = values in this interval not displayed.    Estimated Creatinine Clearance: 56.1 mL/min (by C-G formula based on SCr of 0.83 mg/dL).  Assessment: 75 year old female on Warfarin prior to admission for mechanical valve and atrial fibrillation admitted 2/17 with lower GIB  Cbc remains stable  Clarified home warfarin dose today - INR was 5.5 on 2/16 and put on hold per MD then got admitted 6 mg Mon Thu, 5 mg AOD  To resume warfarin tonight per MD  Goal of Therapy:  Heparin level 0.3-0.5 INR 2.5 - 3.5? Monitor platelets by anticoagulation protocol: Yes   Plan:  Warfarin 3 mg x 1 Daily INR  Barth Kirks, PharmD, BCCCP Clinical Pharmacist [phone]  Please check AMION for all [place] numbers  10/19/2020 5:07 PM

## 2020-10-19 NOTE — Progress Notes (Signed)
PROGRESS NOTE  Angela [name] VWU:[id number] DOB: [birth year] DOA: 10/12/2020 PCP: Lorene Dy, MD  HPI/Recap of past 24 hours: 75 yo female presented with weakness, chest pain and rectal bleeding.  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  Seen by GI.  IR consulted & patient underwent embolization for bleeding in distal colon.  Transferred to ICU due to persistent hypotension.   Transferred to [facility] service on 10/18/2020.  10/19/20: Seen and examined at her bedside.  She denies any abdominal pain or nausea.  She is currently on Rocephin for presumed UTI.  UA positive for pyuria.  Urine culture in process.  Assessment/Plan: Principal Problem:   Acute lower GI hemorrhage Active Problems:   Atrial fibrillation (HCC)   Chronic combined systolic and diastolic heart failure (HCC)   Memory deficit   ICD (implantable cardioverter-defibrillator), biventricular, in situ   Hemorrhagic shock (HCC)   GIB (gastrointestinal bleeding)   Lower abdominal pain  Resolved diverticular bleeding with hemorrhagic shock Possible changes of cirrhosis on CT abd S/p embolization by IR on 2/17. Repeat CT ABD/pelvis 2/19 unrevealing  -appreciate IR / GI assistance with patient care  -if rebleeding occurs > would discuss with IR repeat attempt at coil embolization vs flexible sigmoidoscopy  -last bloody BM 2/20, monitor for further bleeding  Hemoglobin is stable 8.7 from 8.8.  Presumptive UTI Urine analysis positive for pyuria Urine culture in process. Follow urine culture for ID and sensitivities. Treated with Rocephin empirically.  Resolved abdominal pain, likely related to presumptive UTI. Pain management in place. Could be spasm If persistent start Bentyl  Acute blood loss anemia No active bleeding -transfuse for Hgb <8% in setting of significant cardiac disease, depressed EF, initial troponin leak Continue to monitor H&H  Hx of A fib, mechanical MVR -at risk CVA off anticoagulation  with mechanical MV  -re-challenge of heparin initiated 2/21, no further bleeding or change in Hgb  -follow Hgb closely  -Resume Coumadin and monitor H&H.  Hx of non ischemic CM with chronic systolic CHF, SSS, VF s/p ICD -continue amiodarone, digoxin  Start strict I's and O's and daily weight Net I&O +4.0 L  Chronic hypotension BP stable Continue midodrine 10 mg TID  -received 1 dose 50 mg hydrocortisone 2/21  Resolved thrombocytopenia Suspect in setting of consumption Platelet count 152 from 123.  Hx of mild dementia, chronic pain, anxiety with chronic benzo use -PRN xanax, morphine  -delirium prevention precautions  Resolved post repletion: Hypokalemia Serum potassium 3.7 Magnesium 2.0.  Chronic Rt hydronephrosis -Trend BMP / urinary output -Replace electrolytes as indicated -Avoid nephrotoxic agents, ensure adequate renal perfusion  Acute urinary Retention  Foley removed 2/21, previously reinserted due to inability to void.  Start bethanechol Keep Foley in for 1 week then do a voiding trial.      Status is: Inpatient    Dispo:  Patient From: Home  Planned Disposition: Home  Expected discharge date: 10/20/2020  Medically stable for discharge: No, ongoing management of symptomatology.          Objective: Vitals:   10/19/20 0053 10/19/20 0200 10/19/20 0300 10/19/20 0400  BP:    112/62  Pulse:  73    Resp:  16 16 19   Temp:    99.1 F (37.3 C)  TempSrc:    Oral  SpO2:  100%  100%  Weight: 61.7 kg     Height:        Intake/Output Summary (Last 24 hours) at 10/19/2020 1652 Last data  filed at 10/19/2020 0549 Gross per 24 hour  Intake 378.16 ml  Output 1325 ml  Net -946.84 ml   Filed Weights   10/17/20 1400 10/18/20 0017 10/19/20 0053  Weight: 63.1 kg 64.2 kg 61.7 kg    Exam:  . General: 75 y.o. year-old female vertebral point nourished in no acute stress.  Alert and interactive.   . Cardiovascular: Regular rate and rhythm no rubs or  gallops.   [name] Respiratory: Clear to auscultation no wheezes or rales. . Abdomen: Soft nontender, normal bowel sounds present.  .  Musculoskeletal: No lower extremity edema bilaterally. . Skin: No ulcerative lesions noted. [name] Psychiatry: Mood is appropriate for condition setting.   Data Reviewed: CBC: Recent Labs  Lab 10/15/20 0430 10/15/20 1556 10/15/20 2017 10/16/20 0458 10/17/20 0443 10/18/20 0317 10/19/20 0325  WBC 16.0*  --   --  10.5 8.5 8.0 8.1  HGB 8.6*   < > 8.9* 8.7* 8.6* 8.8* 8.7*  HCT 26.2*   < > 27.4* 26.0* 26.5* 26.5* 26.2*  MCV 92.9  --   --  94.2 95.7 93.6 93.9  PLT 60*  --   --  80* 97* 123* 152   < > = values in this interval not displayed.   Basic Metabolic Panel: Recent Labs  Lab 10/13/20 0436 10/14/20 0411 10/15/20 0430 10/16/20 0458 10/17/20 0443  NA 139 137 138 136 135  K 4.1 3.5 3.2* 3.7 3.7  CL 111 110 107 108 106  CO2 21* 22 22 21* 23  GLUCOSE 104* 123* 83 96 105*  BUN 23 12 8 8 11   CREATININE 0.89 0.83 0.77 0.79 0.83  CALCIUM 7.2* 7.5* 7.8* 7.6* 8.2*  MG 1.6* 1.8  --  1.5* 2.0  PHOS 2.9  --   --   --   --    GFR: Estimated Creatinine Clearance: 56.1 mL/min (by C-G formula based on SCr of 0.83 mg/dL). Liver Function Tests: Recent Labs  Lab 10/16/20 0458  AST 14*  ALT 15  ALKPHOS 38  BILITOT 1.0  PROT 4.5*  ALBUMIN 2.2*   No results for input(s): LIPASE, AMYLASE in the last 168 hours. No results for input(s): AMMONIA in the last 168 hours. Coagulation Profile: Recent Labs  Lab 10/15/20 0430 10/16/20 0458 10/17/20 0443 10/18/20 0317 10/19/20 0325  INR 1.8* 1.6* 1.5* 1.2 1.2   Cardiac Enzymes: No results for input(s): CKTOTAL, CKMB, CKMBINDEX, TROPONINI in the last 168 hours. BNP (last 3 results) No results for input(s): PROBNP in the last 8760 hours. HbA1C: No results for input(s): HGBA1C in the last 72 hours. CBG: Recent Labs  Lab 10/16/20 0818 10/16/20 1136 10/16/20 1515 10/17/20 0750 10/17/20 1232  GLUCAP 97  90 132* 83 84   Lipid Profile: No results for input(s): CHOL, HDL, LDLCALC, TRIG, CHOLHDL, LDLDIRECT in the last 72 hours. Thyroid Function Tests: No results for input(s): TSH, T4TOTAL, FREET4, T3FREE, THYROIDAB in the last 72 hours. Anemia Panel: No results for input(s): VITAMINB12, FOLATE, FERRITIN, TIBC, IRON, RETICCTPCT in the last 72 hours. Urine analysis:    Component Value Date/Time   COLORURINE YELLOW 10/18/2020 1457   APPEARANCEUR CLOUDY (A) 10/18/2020 1457   LABSPEC 1.010 10/18/2020 1457   PHURINE 5.0 10/18/2020 1457   GLUCOSEU NEGATIVE 10/18/2020 1457   HGBUR SMALL (A) 10/18/2020 1457   BILIRUBINUR NEGATIVE 10/18/2020 1457   KETONESUR NEGATIVE 10/18/2020 1457   PROTEINUR NEGATIVE 10/18/2020 1457   UROBILINOGEN 0.2 10/23/2010 2318   NITRITE NEGATIVE 10/18/2020 1457   LEUKOCYTESUR  LARGE (A) 10/18/2020 1457   Sepsis Labs: @LABRCNTIP (procalcitonin:4,lacticidven:4)  ) Recent Results (from the past 240 hour(s))  Resp Panel by RT-PCR (Flu A&B, Covid) Nasopharyngeal Swab     Status: None   Collection Time: 10/12/20  6:59 AM   Specimen: Nasopharyngeal Swab; Nasopharyngeal(NP) swabs in vial transport medium  Result Value Ref Range Status   SARS Coronavirus 2 by RT PCR NEGATIVE NEGATIVE Final    Comment: (NOTE) SARS-CoV-2 target nucleic acids are NOT DETECTED.  The SARS-CoV-2 RNA is generally detectable in upper respiratory specimens during the acute phase of infection. The lowest concentration of SARS-CoV-2 viral copies this assay can detect is 138 copies/mL. A negative result does not preclude SARS-Cov-2 infection and should not be used as the sole basis for treatment or other patient management decisions. A negative result may occur with  improper specimen collection/handling, submission of specimen other than nasopharyngeal swab, presence of viral mutation(s) within the areas targeted by this assay, and inadequate number of viral copies(<138 copies/mL). A negative  result must be combined with clinical observations, patient history, and epidemiological information. The expected result is Negative.  Fact Sheet for Patients:  EntrepreneurPulse.com.au  Fact Sheet for Healthcare Providers:  IncredibleEmployment.be  This test is no t yet approved or cleared by the [place] FDA and  has been authorized for detection and/or diagnosis of SARS-CoV-2 by FDA under an Emergency Use Authorization (EUA). This EUA will remain  in effect (meaning this test can be used) for the duration of the COVID-19 declaration under Section 564(b)(1) of the Act, 21 U.S.C.section 360bbb-3(b)(1), unless the authorization is terminated  or revoked sooner.       Influenza A by PCR NEGATIVE NEGATIVE Final   Influenza B by PCR NEGATIVE NEGATIVE Final    Comment: (NOTE) The Xpert Xpress SARS-CoV-2/FLU/RSV plus assay is intended as an aid in the diagnosis of influenza from Nasopharyngeal swab specimens and should not be used as a sole basis for treatment. Nasal washings and aspirates are unacceptable for Xpert Xpress SARS-CoV-2/FLU/RSV testing.  Fact Sheet for Patients: EntrepreneurPulse.com.au  Fact Sheet for Healthcare Providers: IncredibleEmployment.be  This test is not yet approved or cleared by the [place] FDA and has been authorized for detection and/or diagnosis of SARS-CoV-2 by FDA under an Emergency Use Authorization (EUA). This EUA will remain in effect (meaning this test can be used) for the duration of the COVID-19 declaration under Section 564(b)(1) of the Act, 21 U.S.C. section 360bbb-3(b)(1), unless the authorization is terminated or revoked.  Performed at [facility], [place] [address], [place], [place] [address]   MRSA PCR Screening     Status: None   Collection Time: 10/13/20  7:57 AM   Specimen: Nasopharyngeal  Result Value Ref Range Status   MRSA by PCR NEGATIVE  NEGATIVE Final    Comment:        The GeneXpert MRSA Assay (FDA approved for NASAL specimens only), is one component of a comprehensive MRSA colonization surveillance program. It is not intended to diagnose MRSA infection nor to guide or monitor treatment for MRSA infections. Performed at [facility], [place] [address] [place], [place] [address]       Studies: No results found.  Scheduled Meds: . amiodarone  200 mg Oral Daily  . Chlorhexidine Gluconate Cloth  6 each Topical Daily  . digoxin  125 mcg Oral Daily  . feeding supplement  1 Container Oral TID BM  . midodrine  10 mg Oral TID WC  .  pantoprazole  40 mg Oral BID  . sodium chloride flush  10-40 mL Intracatheter Q12H    Continuous Infusions: . sodium chloride Stopped (10/17/20 1342)  . cefTRIAXone (ROCEPHIN)  IV 1 g (10/19/20 0741)  . heparin 900 Units/hr (10/19/20 0549)     LOS: 7 days     Kayleen Memos, MD [facility] Pager [phone]  If 7PM-7AM, please contact night-coverage [email or web address] Password [facility] 10/19/2020, 4:52 PM

## 2020-10-19 NOTE — Plan of Care (Signed)

## 2020-10-19 NOTE — Progress Notes (Signed)
Physical Therapy Treatment Patient Details Name: Angela [name] MRN: [id number] DOB: [birth year] Today's Date: 10/19/2020    History of Present Illness Pt is 75 y.o. female with medical history significant of chronic systolic CHF with AICD/pacemaker placement; rheumatic heart disease s/p MVR; dementia; colon cancer (1993); iliac artery embolus s/p embolectomy; CVA; and afib on Coumadin presented 10/12/20 with lower GI bleeding. Elevated INR given vitamin K; hypotensive, given 2 units PRBC plus fluids; 2/17 coil embolization via left groin access    PT Comments    Pt pleasant and agreeable to PT.  She demonstrated good progress today and was able to ambulate with mod A for 16' with RW.  Pt required constant cues for sequencing and assist with RW management.  Worked on anterior weight shift prior to standing and pt with improved balance/decreased posterior lean today.  Pt does have deficits in L LE (see gait) but reports this is baseline and noted hx of CVA, thrombus in L LE, and recent L groin access for coil embolization.  Continue to recommend SNF from PT perspective, but noting family wanting to take pt home - she does have 24 hr assist per chart and w/c and RW.     Follow Up Recommendations  SNF;Supervision/Assistance - 24 hour (Noting family opting take pt home per TOC note)     Equipment Recommendations  Other (comment);3in1 (PT) (per chart has w/c and walker)    Recommendations for Other Services       Precautions / Restrictions Precautions Precautions: Fall    Mobility  Bed Mobility               General bed mobility comments: in chair at arrival    Transfers Overall transfer level: Needs assistance Equipment used: Rolling walker (2 wheeled) Transfers: Sit to/from Stand Sit to Stand: Min assist         General transfer comment: Sit to stand x 4 throughout session.  Cues for safe hand placement, getting feet under body, leaning forward, and min A to rise.  Worked on  anterior weight shift/leaning/reaching forward prior to sttempts to stand  Ambulation/Gait Ambulation/Gait assistance: Mod assist Gait Distance (Feet): 16 Feet (5', 11', then 16') Assistive device: Rolling walker (2 wheeled) Gait Pattern/deviations: Step-to pattern;Decreased stride length;Trunk flexed;Decreased dorsiflexion - left Gait velocity: decreased   General Gait Details: Pt requiring continuous cues for sequencing (walker, L leg, R leg), RW proximity (with assist to keep RW close), posture, and increased step length on L (with visual target).  Pt also noted to have toe in, internal hip rotation, and decreased DF on the L. Pt is poor historian but reports "I've probably always done that."  Did note hx of CVA, hx of thrombus in L LE, and pt reports L ankle broke in past.   Stairs             Wheelchair Mobility    Modified Rankin (Stroke Patients Only)       Balance Overall balance assessment: Needs assistance Sitting-balance support: No upper extremity supported;Feet supported;Single extremity supported Sitting balance-Leahy Scale: Fair Sitting balance - Comments: Pt tending to lean posteriolry but corrected with verbal cues.  Worked sitting edge of chair on leaning forward and reaching forward  for 3 mins prior to stand.   Standing balance support: Bilateral upper extremity supported Standing balance-Leahy Scale: Poor Standing balance comment: Requiring min A for balance ; however, did not have strong posterior lean today  Cognition Arousal/Alertness: Awake/alert Behavior During Therapy: Flat affect Overall Cognitive Status: No family/caregiver present to determine baseline cognitive functioning Area of Impairment: Awareness;Problem solving;Safety/judgement                         Safety/Judgement: Decreased awareness of safety;Decreased awareness of deficits Awareness: Intellectual Problem Solving: Difficulty  sequencing General Comments: Pt oriented to self and place.  Followed 1 step commands.      Exercises      General Comments General comments (skin integrity, edema, etc.): VSS      Pertinent Vitals/Pain Pain Assessment: No/denies pain    Home Living                      Prior Function            PT Goals (current goals can now be found in the care plan section) Acute Rehab PT Goals Patient Stated Goal: get moving better so she can return home PT Goal Formulation: With patient Time For Goal Achievement: 10/29/20 Potential to Achieve Goals: Good Progress towards PT goals: Progressing toward goals    Frequency    Min 3X/week      PT Plan Current plan remains appropriate    Co-evaluation              AM-PAC PT "6 Clicks" Mobility   Outcome Measure  Help needed turning from your back to your side while in a flat bed without using bedrails?: A Lot Help needed moving from lying on your back to sitting on the side of a flat bed without using bedrails?: A Lot Help needed moving to and from a bed to a chair (including a wheelchair)?: A Lot Help needed standing up from a chair using your arms (e.g., wheelchair or bedside chair)?: A Little Help needed to walk in hospital room?: A Lot Help needed climbing 3-5 steps with a railing? : A Lot 6 Click Score: 13    End of Session Equipment Utilized During Treatment: Gait belt Activity Tolerance: Patient tolerated treatment well Patient left: with chair alarm set;in chair;with call bell/phone within reach (replaced batteries in chair alarm) Nurse Communication: Mobility status PT Visit Diagnosis: Unsteadiness on feet (R26.81);Muscle weakness (generalized) (M62.81);Difficulty in walking, not elsewhere classified (R26.2)     Time: 1203-1228 PT Time Calculation (min) (ACUTE ONLY): 25 min  Charges:  $Gait Training: 8-22 mins $Therapeutic Activity: 8-22 mins                     Abran Richard, PT Acute Rehab  Services Pager [phone] [facility] [place] 10/19/2020, 12:44 PM

## 2020-10-20 DIAGNOSIS — K922 Gastrointestinal hemorrhage, unspecified: Secondary | ICD-10-CM | POA: Diagnosis not present

## 2020-10-20 LAB — PROTIME-INR
INR: 1.2 (ref 0.8–1.2)
Prothrombin Time: 14.6 seconds (ref 11.4–15.2)

## 2020-10-20 LAB — CBC
HCT: 27.3 % — ABNORMAL LOW (ref 36.0–46.0)
Hemoglobin: 8.9 g/dL — ABNORMAL LOW (ref 12.0–15.0)
MCH: 31 pg (ref 26.0–34.0)
MCHC: 32.6 g/dL (ref 30.0–36.0)
MCV: 95.1 fL (ref 80.0–100.0)
Platelets: 194 10*3/uL (ref 150–400)
RBC: 2.87 MIL/uL — ABNORMAL LOW (ref 3.87–5.11)
RDW: 16.6 % — ABNORMAL HIGH (ref 11.5–15.5)
WBC: 5.8 10*3/uL (ref 4.0–10.5)
nRBC: 0 % (ref 0.0–0.2)

## 2020-10-20 LAB — HEPARIN LEVEL (UNFRACTIONATED): Heparin Unfractionated: 0.43 IU/mL (ref 0.30–0.70)

## 2020-10-20 MED ORDER — WARFARIN SODIUM 2 MG PO TABS
4.0000 mg | ORAL_TABLET | Freq: Once | ORAL | Status: AC
  Administered 2020-10-20: 4 mg via ORAL
  Filled 2020-10-20: qty 2

## 2020-10-20 MED ORDER — ENOXAPARIN SODIUM 60 MG/0.6ML ~~LOC~~ SOLN
60.0000 mg | Freq: Two times a day (BID) | SUBCUTANEOUS | Status: DC
  Administered 2020-10-20 – 2020-10-26 (×13): 60 mg via SUBCUTANEOUS
  Filled 2020-10-20 (×13): qty 0.6

## 2020-10-20 MED ORDER — BETHANECHOL CHLORIDE 5 MG PO TABS
5.0000 mg | ORAL_TABLET | Freq: Three times a day (TID) | ORAL | Status: DC
  Administered 2020-10-20 – 2020-10-26 (×16): 5 mg via ORAL
  Filled 2020-10-20 (×21): qty 1

## 2020-10-20 NOTE — Progress Notes (Addendum)
PROGRESS NOTE  Angela [name] VPX:[id number] DOB: 07-05-1946 DOA: 10/12/2020 PCP: Lorene Dy, MD  HPI/Recap of past 24 hours: 75 yo female presented with weakness, chest pain and rectal bleeding.  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  Seen by GI.  IR consulted & patient underwent embolization for bleeding in distal colon.  Transferred to ICU due to persistent hypotension.  Off vasopressors.  GI bleed has resolved and hemoglobin is stable.  Transferred to [facility] service on 10/18/2020.  10/20/20: She was seen and examined at her bedside.  She has no new complaints.  INR subtherapeutic 1.2, pharmacy consulted to bridge with subcu Lovenox.  She was seen by PT OT with recommendation for SNF.  She declines SNF and wants to return to home with home health services.  TOC consulted to assist with home health services arrangement.     Assessment/Plan: Principal Problem:   Acute lower GI hemorrhage Active Problems:   Atrial fibrillation (HCC)   Chronic combined systolic and diastolic heart failure (HCC)   Memory deficit   ICD (implantable cardioverter-defibrillator), biventricular, in situ   Hemorrhagic shock (HCC)   GIB (gastrointestinal bleeding)   Lower abdominal pain  Resolved diverticular bleeding with hemorrhagic shock Possible changes of cirrhosis on CT abd S/p embolization by IR on 10/12/20. Repeat CT ABD/pelvis 10/14/20 unrevealing  -appreciate IR / GI assistance with patient care  -if rebleeding occurs > would discuss with IR repeat attempt at coil embolization vs flexible sigmoidoscopy  -last bloody BM 10/15/20, no recurrence is reported. Hemoglobin is uptrending, 8.9 from 8.7. Continue to monitor H&H.  Klebsiella pneumonia UTI Urine analysis positive for pyuria Urine culture positive for Klebsiella pneumonia greater than 100,000 colonies. Follow urine culture for sensitivities. Continue Rocephin empirically, narrow down antibiotics once sensitivities result.  Acute  urinary Retention  Foley removed 2/21, previously reinserted due to inability to void.  Start bethanechol, continue until seen by urology outpatient. Keep Foley in place for 7 days then arrange follow-up with urology for voiding trial outpatient.   Renal function intact.  Resolved abdominal pain, likely related to Klebsiella pneumonia UTI. Pain management in place. Could also be spasm, if recurrent can start Bentyl  Acute blood loss anemia No active bleeding -transfuse for Hgb <8% in setting of significant cardiac disease, depressed EF, initial troponin leak Continue to monitor H&H  Hx of A fib, mechanical MVR -Initially off Coumadin, at risk of CVA off anticoagulation with mechanical MV  -re-challenge of heparin initiated 2/21, no further bleeding or change in Hgb.  Started Coumadin on 10/19/2020.  Subtherapeutic INR INR 1.2, not at goal Goal INR 2.5-3 Bridge Coumadin with subcu Lovenox. Appreciate pharmacy's assistance  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, SSS, VF s/p ICD -continue amiodarone, digoxin  Continue strict I's and O's and daily weight Net I&O +3.8 L  Chronic hypotension BP stable Continue midodrine 10 mg TID  -received 1 dose 50 mg hydrocortisone 2/21  Resolved thrombocytopenia Suspect in setting of consumption Platelet count 194.  Hx of mild dementia, chronic pain -PRN morphine  -delirium prevention precautions  Resolved post repletion: Hypokalemia Serum potassium 3.7 Magnesium 2.0. Repeat BMP in the morning  Resolved post repletion: Hypomagnesemia Serum magnesium 2.0 from 1.5. Repeat magnesium level in the morning  Chronic Rt hydronephrosis -Trend BMP / urinary output -Replace electrolytes as indicated -Avoid nephrotoxic agents, ensure adequate renal perfusion  Physical debility PT OT recommended SNF Patient wants to return home with home health services Continue PT OT  with assistance and fall precautions.  Moderate protein  calorie malnutrition Albumin 2.2 BMI 21 Moderate muscle mass loss Oral supplement Encouraged to increase protein calorie intake  Status is: Inpatient    Dispo:  Patient From: Home  Planned Disposition: Home with Health Care Svc  Expected discharge date: 10/22/2020  Medically stable for discharge: No, ongoing management of subtherapeutic INR on Coumadin.          Objective: Vitals:   10/20/20 0400 10/20/20 1000 10/20/20 1002 10/20/20 1200  BP: (!) 100/54 (!) 110/50  114/64  Pulse: 73 73  76  Resp: 19 11  17   Temp: 98.6 F (37 C)  98.7 F (37.1 C)   TempSrc: Oral  Oral   SpO2: 97% 99%  99%  Weight:      Height:        Intake/Output Summary (Last 24 hours) at 10/20/2020 1649 Last data filed at 10/20/2020 1200 Gross per 24 hour  Intake 1080.03 ml  Output 1500 ml  Net -419.97 ml   Filed Weights   10/18/20 0017 10/19/20 0053 10/20/20 0128  Weight: 64.2 kg 61.7 kg 61.3 kg    Exam:  . General: 75 y.o. year-old female frail-appearing no acute distress.  Alert and interactive.   . Cardiovascular: Regular rate and rhythm no rubs or gallops. [name] Respiratory: Clear to auscultation no wheezes or rales. . Abdomen: Soft nontender normal bowel sounds present. .  Musculoskeletal: No lower extremity edema bilaterally.   . Skin: No ulcerative lesions noted. [name] Psychiatry: Mood is appropriate for condition and setting.   Data Reviewed: CBC: Recent Labs  Lab 10/16/20 0458 10/17/20 0443 10/18/20 0317 10/19/20 0325 10/20/20 0243  WBC 10.5 8.5 8.0 8.1 5.8  HGB 8.7* 8.6* 8.8* 8.7* 8.9*  HCT 26.0* 26.5* 26.5* 26.2* 27.3*  MCV 94.2 95.7 93.6 93.9 95.1  PLT 80* 97* 123* 152 778   Basic Metabolic Panel: Recent Labs  Lab 10/14/20 0411 10/15/20 0430 10/16/20 0458 10/17/20 0443  NA 137 138 136 135  K 3.5 3.2* 3.7 3.7  CL 110 107 108 106  CO2 22 22 21* 23  GLUCOSE 123* 83 96 105*  BUN 12 8 8 11   CREATININE 0.83 0.77 0.79 0.83  CALCIUM 7.5* 7.8* 7.6* 8.2*  MG 1.8  --   1.5* 2.0   GFR: Estimated Creatinine Clearance: 55.8 mL/min (by C-G formula based on SCr of 0.83 mg/dL). Liver Function Tests: Recent Labs  Lab 10/16/20 0458  AST 14*  ALT 15  ALKPHOS 38  BILITOT 1.0  PROT 4.5*  ALBUMIN 2.2*   No results for input(s): LIPASE, AMYLASE in the last 168 hours. No results for input(s): AMMONIA in the last 168 hours. Coagulation Profile: Recent Labs  Lab 10/16/20 0458 10/17/20 0443 10/18/20 0317 10/19/20 0325 10/20/20 0243  INR 1.6* 1.5* 1.2 1.2 1.2   Cardiac Enzymes: No results for input(s): CKTOTAL, CKMB, CKMBINDEX, TROPONINI in the last 168 hours. BNP (last 3 results) No results for input(s): PROBNP in the last 8760 hours. HbA1C: No results for input(s): HGBA1C in the last 72 hours. CBG: Recent Labs  Lab 10/16/20 0818 10/16/20 1136 10/16/20 1515 10/17/20 0750 10/17/20 1232  GLUCAP 97 90 132* 83 84   Lipid Profile: No results for input(s): CHOL, HDL, LDLCALC, TRIG, CHOLHDL, LDLDIRECT in the last 72 hours. Thyroid Function Tests: No results for input(s): TSH, T4TOTAL, FREET4, T3FREE, THYROIDAB in the last 72 hours. Anemia Panel: No results for input(s): VITAMINB12, FOLATE, FERRITIN, TIBC, IRON, RETICCTPCT in the  last 72 hours. Urine analysis:    Component Value Date/Time   COLORURINE YELLOW 10/18/2020 1457   APPEARANCEUR CLOUDY (A) 10/18/2020 1457   LABSPEC 1.010 10/18/2020 1457   PHURINE 5.0 10/18/2020 1457   GLUCOSEU NEGATIVE 10/18/2020 1457   HGBUR SMALL (A) 10/18/2020 1457   BILIRUBINUR NEGATIVE 10/18/2020 1457   KETONESUR NEGATIVE 10/18/2020 1457   PROTEINUR NEGATIVE 10/18/2020 1457   UROBILINOGEN 0.2 10/23/2010 2318   NITRITE NEGATIVE 10/18/2020 1457   LEUKOCYTESUR LARGE (A) 10/18/2020 1457   Sepsis Labs: @LABRCNTIP (procalcitonin:4,lacticidven:4)  ) Recent Results (from the past 240 hour(s))  Resp Panel by RT-PCR (Flu A&B, Covid) Nasopharyngeal Swab     Status: None   Collection Time: 10/12/20  6:59 AM    Specimen: Nasopharyngeal Swab; Nasopharyngeal(NP) swabs in vial transport medium  Result Value Ref Range Status   SARS Coronavirus 2 by RT PCR NEGATIVE NEGATIVE Final    Comment: (NOTE) SARS-CoV-2 target nucleic acids are NOT DETECTED.  The SARS-CoV-2 RNA is generally detectable in upper respiratory specimens during the acute phase of infection. The lowest concentration of SARS-CoV-2 viral copies this assay can detect is 138 copies/mL. A negative result does not preclude SARS-Cov-2 infection and should not be used as the sole basis for treatment or other patient management decisions. A negative result may occur with  improper specimen collection/handling, submission of specimen other than nasopharyngeal swab, presence of viral mutation(s) within the areas targeted by this assay, and inadequate number of viral copies(<138 copies/mL). A negative result must be combined with clinical observations, patient history, and epidemiological information. The expected result is Negative.  Fact Sheet for Patients:  EntrepreneurPulse.com.au  Fact Sheet for Healthcare Providers:  IncredibleEmployment.be  This test is no t yet approved or cleared by the [place] FDA and  has been authorized for detection and/or diagnosis of SARS-CoV-2 by FDA under an Emergency Use Authorization (EUA). This EUA will remain  in effect (meaning this test can be used) for the duration of the COVID-19 declaration under Section 564(b)(1) of the Act, 21 U.S.C.section 360bbb-3(b)(1), unless the authorization is terminated  or revoked sooner.       Influenza A by PCR NEGATIVE NEGATIVE Final   Influenza B by PCR NEGATIVE NEGATIVE Final    Comment: (NOTE) The Xpert Xpress SARS-CoV-2/FLU/RSV plus assay is intended as an aid in the diagnosis of influenza from Nasopharyngeal swab specimens and should not be used as a sole basis for treatment. Nasal washings and aspirates are  unacceptable for Xpert Xpress SARS-CoV-2/FLU/RSV testing.  Fact Sheet for Patients: EntrepreneurPulse.com.au  Fact Sheet for Healthcare Providers: IncredibleEmployment.be  This test is not yet approved or cleared by the [place] FDA and has been authorized for detection and/or diagnosis of SARS-CoV-2 by FDA under an Emergency Use Authorization (EUA). This EUA will remain in effect (meaning this test can be used) for the duration of the COVID-19 declaration under Section 564(b)(1) of the Act, 21 U.S.C. section 360bbb-3(b)(1), unless the authorization is terminated or revoked.  Performed at [facility], [place] [address] [place], [place] [address]   MRSA PCR Screening     Status: None   Collection Time: 10/13/20  7:57 AM   Specimen: Nasopharyngeal  Result Value Ref Range Status   MRSA by PCR NEGATIVE NEGATIVE Final    Comment:        The GeneXpert MRSA Assay (FDA approved for NASAL specimens only), is one component of a comprehensive MRSA colonization surveillance program. It is not intended to diagnose  MRSA infection nor to guide or monitor treatment for MRSA infections. Performed at [facility], [place] [address] [place], [place] [address]   Culture, Urine     Status: Abnormal (Preliminary result)   Collection Time: 10/19/20  5:57 AM   Specimen: Urine, Random  Result Value Ref Range Status   Specimen Description URINE, RANDOM  Final   Special Requests   Final    NONE Performed at [facility], [place] [address] [place], [place] [address]    Culture >=100,000 COLONIES/mL KLEBSIELLA PNEUMONIAE (A)  Final   Report Status PENDING  Incomplete      Studies: No results found.  Scheduled Meds: . amiodarone  200 mg Oral Daily  . Chlorhexidine Gluconate Cloth  6 each Topical Daily  . digoxin  125 mcg Oral Daily  . enoxaparin (LOVENOX) injection  60 mg Subcutaneous BID  . feeding supplement  1 Container Oral TID BM   . midodrine  10 mg Oral TID WC  . pantoprazole  40 mg Oral BID  . sodium chloride flush  10-40 mL Intracatheter Q12H  . Warfarin - Pharmacist Dosing Inpatient   Does not apply q1600    Continuous Infusions: . sodium chloride Stopped (10/20/20 0618)  . cefTRIAXone (ROCEPHIN)  IV 200 mL/hr at 10/20/20 9767     LOS: 8 days     Kayleen Memos, MD [facility] Pager [phone]  If 7PM-7AM, please contact night-coverage [email or web address] Password [facility] 10/20/2020, 4:49 PM

## 2020-10-20 NOTE — Progress Notes (Signed)
Occupational Therapy Treatment Patient Details Name: Angela [name] MRN: [id number] DOB: 1946-02-12 Today's Date: 10/20/2020    History of present illness Pt is 76 y.o. female with medical history significant of chronic systolic CHF with AICD/pacemaker placement; rheumatic heart disease s/p MVR; dementia; colon cancer (1993); iliac artery embolus s/p embolectomy; CVA; and afib on Coumadin presented 10/12/20 with lower GI bleeding. Elevated INR given vitamin K; hypotensive, given 2 units PRBC plus fluids; 2/17 coil embolization via left groin access   OT comments  This 75 yo female admitted with above presents to acute OT with making progress with grooming, toilet transfers, and sit<>stands. She will continue to benefit from acute OT with follow up at SNF v. [place].  Follow Up Recommendations  SNF;Supervision/Assistance - 24 hour (unless family can provide 24 hour S/prn A any time she is up)    Equipment Recommendations  3 in 1 bedside commode       Precautions / Restrictions Precautions Precautions: Fall       Mobility Bed Mobility               General bed mobility comments: in chair at arrival    Transfers Overall transfer level: Needs assistance Equipment used: Rolling walker (2 wheeled) Transfers: Sit to/from [facility] Sit to Stand: Min assist Stand pivot transfers: Min assist       General transfer comment: Cues for safe hand placement    Balance Overall balance assessment: Needs assistance Sitting-balance support: No upper extremity supported;Feet supported Sitting balance-Leahy Scale: Fair Sitting balance - Comments: statically, min-mod A dynamically   Standing balance support: Bilateral upper extremity supported Standing balance-Leahy Scale: Poor Standing balance comment: reliant on RW and A from therapist                           ADL either performed or assessed with clinical judgement   ADL Overall ADL's : Needs  assistance/impaired Eating/Feeding: Set up;Sitting Eating/Feeding Details (indicate cue type and reason): in recliner Grooming: Set up;Sitting;Wash/dry face Grooming Details (indicate cue type and reason): in recliner             Lower Body Dressing: Maximal assistance Lower Body Dressing Details (indicate cue type and reason): to doff and donn socks (can get them off her heels but not off her toes, can't get them on her toes, but can pull them up after they are to mid foot. Posterior lean in recliner with trying this task Toilet Transfer: Minimal assistance;Stand-pivot   Toileting- Clothing Manipulation and Hygiene: Moderate assistance Toileting - Clothing Manipulation Details (indicate cue type and reason): min  A sit<>stand             Vision Patient Visual Report: No change from baseline            Cognition Arousal/Alertness: Awake/alert Behavior During Therapy: Flat affect Overall Cognitive Status: No family/caregiver present to determine baseline cognitive functioning Area of Impairment: Following commands;Safety/judgement;Problem solving;Orientation                 Orientation Level: Place;Situation;Time     Following Commands: Follows one step commands consistently Safety/Judgement: Decreased awareness of safety;Decreased awareness of deficits   Problem Solving: Difficulty sequencing;Requires verbal cues                     Pertinent Vitals/ Pain       Pain Assessment: Faces Faces Pain Scale: Hurts a little bit Pain Location: head  Pain Descriptors / Indicators: Aching Pain Intervention(s): Monitored during session (asked pt if she would like pain meds for headache and she politely declined.)         Frequency  Min 2X/week        Progress Toward Goals  OT Goals(current goals can now be found in the care plan section)  Progress towards OT goals: Progressing toward goals  Acute Rehab OT Goals OT Goal Formulation: With patient Time  For Goal Achievement: 11/01/20 Potential to Achieve Goals: [place] Discharge plan remains appropriate       AM-PAC OT "6 Clicks" Daily Activity     Outcome Measure   Help from another person eating meals?: A Little Help from another person taking care of personal grooming?: A Little Help from another person toileting, which includes using toliet, bedpan, or urinal?: A Lot Help from another person bathing (including washing, rinsing, drying)?: A Lot Help from another person to put on and taking off regular upper body clothing?: A Lot Help from another person to put on and taking off regular lower body clothing?: A Lot 6 Click Score: 14    End of Session Equipment Utilized During Treatment: Gait belt  OT Visit Diagnosis: Unsteadiness on feet (R26.81);Other abnormalities of gait and mobility (R26.89);Muscle weakness (generalized) (M62.81);Other symptoms and signs involving cognitive function   Activity Tolerance Patient tolerated treatment well   Patient Left in chair;with call bell/phone within reach;with chair alarm set   Nurse Communication  (I set up patient for lunch)        Time: 8502-7741 OT Time Calculation (min): 17 min  Charges: OT General Charges $OT Visit: 1 Visit OT Treatments $Self Care/Home Management : 8-22 mins  [address], OTR/L Acute [employer] Pager [phone] Office [phone]      Almon Register 10/20/2020, 1:18 PM

## 2020-10-20 NOTE — TOC Transition Note (Addendum)
Transition of Care [facility]) - CM/SW Discharge Note   Patient Details  Name: Angela [name] MRN: [id number] Date of Birth: [birth year]  Transition of [facility]) CM/SW Contact:  Zenon Mayo, RN Phone Number: 10/20/2020, 4:54 PM   Clinical Narrative:    NCM spoke with son Joe, informed him that [facility] could not take referral , he is ok with [facility] , NCM made referral to [facility] with [facility] , he is able to take referral for [place], [place], [place] and SW. Will need orders. Son states he will transport patient home by car at time of discharge.   Final next level of care: [place] Barriers to Discharge: Continued Medical Work up   Patient Goals and CMS Choice Patient states their goals for this hospitalization and ongoing recovery are:: Pt expressed desire to return home but defferred decision to her son. Son wants pt to return home instead of a SNF   Choice offered to / list presented to : Adult Children  Discharge Placement                       Discharge Plan and Services                  DME Agency: NA       HH Arranged: RN,PT,OT,Nurse's Aide,Social Work [employer] Agency: [place] Date [place]: 10/20/20 Time [place]: [place] Representative spoke with at [place]: [place] ([place]) Interventions     Readmission Risk Interventions Readmission Risk Prevention Plan 10/13/2019 01/19/2019 12/05/2018  Transportation Screening Complete Complete -  PCP or Specialist Appt within 3-5 Days - - -  HRI or [place] - Complete Complete  Social Work Consult for [place] Planning/Counseling - Complete Complete  Palliative Care Screening - Complete -  Medication Review Press photographer) Complete Complete -  [place] or Home Care Consult Complete - -  SW Recovery Care/Counseling Consult Complete - -  Palliative Care Screening Not Applicable - -  [place] Not Applicable - -  Some  recent data might be hidden

## 2020-10-20 NOTE — Care Management Important Message (Signed)
Important Message  Patient Details  Name: Angela [name] MRN: [id number] Date of Birth: 1946-08-04   Medicare Important Message Given:  Yes     Shelda Altes 10/20/2020, 11:18 AM

## 2020-10-20 NOTE — Progress Notes (Signed)
ANTICOAGULATION CONSULT NOTE - Initial Consult  Pharmacy Consult for IV Heparin>>lovenox/coumadin Indication: atrial fibrillation and Mechanical valve  No Known Allergies  Patient Measurements: Height: 5\' 7"  (170.2 cm) Weight: 61.3 kg (135 lb 2.3 oz) IBW/kg (Calculated) : 61.6 Heparin Dosing Weight: 59.3 kg  Vital Signs: Temp: 98.6 F (37 C) (02/25 0400) Temp Source: Oral (02/25 0400) BP: 100/54 (02/25 0400) Pulse Rate: 73 (02/25 0400)  Labs: Recent Labs    10/18/20 0317 10/18/20 1301 10/18/20 2222 10/19/20 0325 10/20/20 0243  HGB 8.8*  --   --  8.7* 8.9*  HCT 26.5*  --   --  26.2* 27.3*  PLT 123*  --   --  152 194  LABPROT 14.6  --   --  14.5 14.6  INR 1.2  --   --  1.2 1.2  HEPARINUNFRC 0.26*   < > 0.38 0.45 0.43   < > = values in this interval not displayed.    Estimated Creatinine Clearance: 55.8 mL/min (by C-G formula based on SCr of 0.83 mg/dL).  Assessment: 75 year old female on Warfarin prior to admission for mechanical valve and atrial fibrillation admitted 2/17 with lower GIB  Cbc remains stable  Clarified home warfarin dose today - INR was 5.5 on 2/16 and put on hold per MD then got admitted 6 mg Mon Thu, 5 mg AOD  Coumadin was resumed last PM for her MVR. Plan is to change heparin to Lovenox for discharge. Since she was supratherapeutic on her previous home dose, we will reduce the coumadin recommendation on DC.   Goal of Therapy:  Lovenox 0.6-1 INR 2.5 - 3 Monitor platelets by anticoagulation protocol: Yes   Plan:  Coumadin 4mg  PO x1 today If dc, consider 4mg  daily and INR check Monday Dc heparin Lovenox 60mg  SQ BID  Onnie Boer, PharmD, BCIDP, AAHIVP, CPP Infectious Disease Pharmacist 10/20/2020 7:49 AM

## 2020-10-21 DIAGNOSIS — I5042 Chronic combined systolic (congestive) and diastolic (congestive) heart failure: Secondary | ICD-10-CM

## 2020-10-21 DIAGNOSIS — R791 Abnormal coagulation profile: Secondary | ICD-10-CM | POA: Diagnosis not present

## 2020-10-21 DIAGNOSIS — K922 Gastrointestinal hemorrhage, unspecified: Secondary | ICD-10-CM | POA: Diagnosis not present

## 2020-10-21 DIAGNOSIS — I482 Chronic atrial fibrillation, unspecified: Secondary | ICD-10-CM | POA: Diagnosis not present

## 2020-10-21 LAB — PROTIME-INR
INR: 1.1 (ref 0.8–1.2)
Prothrombin Time: 14 seconds (ref 11.4–15.2)

## 2020-10-21 LAB — CBC
HCT: 26.6 % — ABNORMAL LOW (ref 36.0–46.0)
Hemoglobin: 8.8 g/dL — ABNORMAL LOW (ref 12.0–15.0)
MCH: 31.5 pg (ref 26.0–34.0)
MCHC: 33.1 g/dL (ref 30.0–36.0)
MCV: 95.3 fL (ref 80.0–100.0)
Platelets: 201 10*3/uL (ref 150–400)
RBC: 2.79 MIL/uL — ABNORMAL LOW (ref 3.87–5.11)
RDW: 16.3 % — ABNORMAL HIGH (ref 11.5–15.5)
WBC: 5.7 10*3/uL (ref 4.0–10.5)
nRBC: 0 % (ref 0.0–0.2)

## 2020-10-21 LAB — BASIC METABOLIC PANEL
Anion gap: 8 (ref 5–15)
BUN: 10 mg/dL (ref 8–23)
CO2: 24 mmol/L (ref 22–32)
Calcium: 8.6 mg/dL — ABNORMAL LOW (ref 8.9–10.3)
Chloride: 106 mmol/L (ref 98–111)
Creatinine, Ser: 0.92 mg/dL (ref 0.44–1.00)
GFR, Estimated: >60 mL/min (ref >60)
Glucose, Bld: 92 mg/dL (ref 70–99)
Potassium: 3.9 mmol/L (ref 3.5–5.1)
Sodium: 138 mmol/L (ref 135–145)

## 2020-10-21 LAB — URINE CULTURE: Culture: >=100000 — AB

## 2020-10-21 MED ORDER — WARFARIN SODIUM 5 MG PO TABS
5.0000 mg | ORAL_TABLET | Freq: Once | ORAL | Status: AC
  Administered 2020-10-21: 5 mg via ORAL
  Filled 2020-10-21: qty 1

## 2020-10-21 MED ORDER — CEPHALEXIN 250 MG PO CAPS
500.0000 mg | ORAL_CAPSULE | Freq: Two times a day (BID) | ORAL | Status: AC
  Administered 2020-10-21 – 2020-10-26 (×10): 500 mg via ORAL
  Filled 2020-10-21 (×10): qty 2

## 2020-10-21 NOTE — Progress Notes (Addendum)
PROGRESS NOTE    Angela [name]  WNI:[id number] DOB: August 19, 1946 DOA: 10/12/2020 PCP: Lorene Dy, MD   Brief Narrative:   Patient is a 75 yo female who presented with chest pain, weakness, and rectal bleeding.  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  GI and IR consulted. Patient underwent embolization for bleeding in distal colon. Developed hypotension and was transferred to the ICU and placed on vasopressors.  GI has resolved with hemoglobin stabilized.   Assessment & Plan:   Principal Problem:   Acute lower GI hemorrhage Active Problems:   Atrial fibrillation (HCC)   Chronic combined systolic and diastolic heart failure (HCC)   Memory deficit   ICD (implantable cardioverter-defibrillator), biventricular, in situ   Hemorrhagic shock (HCC)   GIB (gastrointestinal bleeding)   Lower abdominal pain   Resolved diverticular bleeding with hemorrhagic shock Possible changes of cirrhosis on CT abdomen -10/12/20 CT angiogram is positive for GI bleeding involving the descending colon, presumably diverticular disease. -S/P IR coil embolization of distal branch of left colic artery -Repeat CT abd/pelvis 10/14/20, shows no acute findings -Last reported bloody BM on 10/15/20 -Hgb stable at 8.8, no signs of bleeding -Trend H/H   Klebsiella pneumonia UTI -2/24 UA positive for pyuria and urine culture 10/19/20 positive for Klebsiella pneumonia > 100,000 colonies -Started on Rocephin empirically while pending culture and sensitivities. -C/S results back, sensitive to cefazolin, so we will start her on Keflex 500 mg BID  Abdominal pain, in setting of Klebsiella pneumonia UTI -Urine culture 10/19/20 positive for Klebsiella pneumonia, see treatment above. Symptoms have improved. -Consider bentyl if pain recurs.  Acute Urinary Retention -During hospital course, several episodes of urinary retention with Foley catheter placement. -Current has a Foley in place as of 10/18/20  to remain for 7 days with urology outpatient follow-up for voiding trial. -Crt and BUN WNL.  Acute blood loss anemia -No evidence of active bleeding -Transfuse for Hgb of < 8, with significant cardiac disease and decreased EF -Trend H/H  Hx of a fib., mechanical MVR -Initially coumadin paused due to acute blood loss anemia, however with risk for CVA with MVR a re-challenge of heparin was started on 2/21, since no bleeding or change in Hgb noted Coumadin was started on 10/19/20 -Pharmacy to bridge coumadin to Lovenox -Start Lovenox 60mg  SQ BID. Coumadin 5mg  PO x1 today   Subtherapeutic INR -INR 1.2, remains subtherapeutic -Goal INR 2.5-3.0 -Pharmacy to bridge coumadin with subcutaneous Lovenox. -Start Lovenox 60mg  SQ BID. 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, SSS, VF s/p ICD -Continue digoxin and amiodarone -Continue strict I/O's, daily weights -Net I/O -738.5  Chronic hypotension -SBP 90-100's, stable -Continue midodrine 10 mg TID -Receive 1x dose 50 mg hydrocortisone 2/21  Thrombocytopenia, resolved, in setting of acute illness -Platelets normalized at 194  Chronic pain, dementia/memory deficit -Delirium precautions -prn morphine  Chronic Rt hydronephrosis -Monitor urine output and BMP -Replace electrolytes as needed -Avoid nephrotoxic agents  Hypokalemia, resolved -Currently 3.7, continue to trend BMP  Hypomagnesemia, resolved  -Currently 2.0, up from 1.5.  Moderate protein calorie malnutrition -Albumin 2.1, BMI 21 -Moderate muscle loss -Encourage protein calorie intake  Physical debility -PT/OT recommendation for SNF -Patient wants home with home health -Continue PT/OT as inpatient   DVT prophylaxis: Lovenox Code Status: Full Family Communication:  None at bedside  Status is: Inpatient   Dispo:  Patient From: Home  Planned Disposition: Home with Health Care Svc  Medically  stable for discharge: No, ongoing management  of subtherapeutic INR on                    Coumadin.    Body mass index is 21.13 kg/m.    Consultants:   GI  IR  Procedures:   2/17 IR mesenteric angiogram and embolism descending colon  Antimicrobials:   Rocephin   Subjective:  Seen and examined at bedside, no new complaints today.  Review of Systems Otherwise negative except as per HPI, including: General: Denies fever, chills, night sweats or unintended weight loss. Resp: Denies cough, wheezing, shortness of breath. Cardiac: Denies chest pain, palpitations, orthopnea, paroxysmal nocturnal dyspnea. GI: Denies abdominal pain, nausea, vomiting, diarrhea or constipation GU: Denies dysuria, frequency, hesitancy or incontinence MS: Denies muscle aches, joint pain or swelling Neuro: Denies headache, neurologic deficits (focal weakness, numbness, tingling), abnormal gait Psych: Denies anxiety, depression, SI/HI/AVH Skin: Denies new rashes or lesions ID: Denies sick contacts, exotic exposures, travel  Examination:  General exam: Appears calm and comfortable  Respiratory system: Clear to auscultation. Respiratory effort normal. Cardiovascular system: S1 & S2 heard, RRR. No JVD, murmurs, rubs, gallops or clicks. No pedal edema. Gastrointestinal system: Abdomen is nondistended, soft and nontender. No organomegaly or masses felt. Normal bowel sounds heard. Central nervous system: Alert and oriented. No focal neurological deficits. Extremities: Symmetric 5 x 5 power. Skin: No rashes, lesions or ulcers Psychiatry: Judgement and insight appear normal. Mood & affect appropriate.     Objective: Vitals:   10/21/20 0109 10/21/20 0346 10/21/20 0735 10/21/20 1053  BP:  114/66    Pulse:  76    Resp:  14    Temp:  98.6 F (37 C) 98.5 F (36.9 C) 98.5 F (36.9 C)  TempSrc:  Oral Oral Oral  SpO2:  100%    Weight: 61.2 kg     Height:        Intake/Output Summary (Last 24 hours) at 10/21/2020 1436 Last data filed at  10/21/2020 1227 Gross per 24 hour  Intake 1077.36 ml  Output 1925 ml  Net -847.64 ml   Filed Weights   10/19/20 0053 10/20/20 0128 10/21/20 0109  Weight: 61.7 kg 61.3 kg 61.2 kg     Data Reviewed:   CBC: Recent Labs  Lab 10/17/20 0443 10/18/20 0317 10/19/20 0325 10/20/20 0243 10/21/20 0311  WBC 8.5 8.0 8.1 5.8 5.7  HGB 8.6* 8.8* 8.7* 8.9* 8.8*  HCT 26.5* 26.5* 26.2* 27.3* 26.6*  MCV 95.7 93.6 93.9 95.1 95.3  PLT 97* 123* 152 194 119   Basic Metabolic Panel: Recent Labs  Lab 10/15/20 0430 10/16/20 0458 10/17/20 0443 10/21/20 0311  NA 138 136 135 138  K 3.2* 3.7 3.7 3.9  CL 107 108 106 106  CO2 22 21* 23 24  GLUCOSE 83 96 105* 92  BUN 8 8 11 10   CREATININE 0.77 0.79 0.83 0.92  CALCIUM 7.8* 7.6* 8.2* 8.6*  MG  --  1.5* 2.0  --    GFR: Estimated Creatinine Clearance: 50.3 mL/min (by C-G formula based on SCr of 0.92 mg/dL). Liver Function Tests: Recent Labs  Lab 10/16/20 0458  AST 14*  ALT 15  ALKPHOS 38  BILITOT 1.0  PROT 4.5*  ALBUMIN 2.2*   No results for input(s): LIPASE, AMYLASE in the last 168 hours. No results for input(s): AMMONIA in the last 168 hours. Coagulation Profile: Recent Labs  Lab 10/17/20 0443 10/18/20 0317 10/19/20 0325 10/20/20 0243 10/21/20 0311  INR 1.5*  1.2 1.2 1.2 1.1   Cardiac Enzymes: No results for input(s): CKTOTAL, CKMB, CKMBINDEX, TROPONINI in the last 168 hours. BNP (last 3 results) No results for input(s): PROBNP in the last 8760 hours. HbA1C: No results for input(s): HGBA1C in the last 72 hours. CBG: Recent Labs  Lab 10/16/20 0818 10/16/20 1136 10/16/20 1515 10/17/20 0750 10/17/20 1232  GLUCAP 97 90 132* 83 84   Lipid Profile: No results for input(s): CHOL, HDL, LDLCALC, TRIG, CHOLHDL, LDLDIRECT in the last 72 hours. Thyroid Function Tests: No results for input(s): TSH, T4TOTAL, FREET4, T3FREE, THYROIDAB in the last 72 hours. Anemia Panel: No results for input(s): VITAMINB12, FOLATE, FERRITIN, TIBC,  IRON, RETICCTPCT in the last 72 hours. Sepsis Labs: No results for input(s): PROCALCITON, LATICACIDVEN in the last 168 hours.  Recent Results (from the past 240 hour(s))  Resp Panel by RT-PCR (Flu A&B, Covid) Nasopharyngeal Swab     Status: None   Collection Time: 10/12/20  6:59 AM   Specimen: Nasopharyngeal Swab; Nasopharyngeal(NP) swabs in vial transport medium  Result Value Ref Range Status   SARS Coronavirus 2 by RT PCR NEGATIVE NEGATIVE Final    Comment: (NOTE) SARS-CoV-2 target nucleic acids are NOT DETECTED.  The SARS-CoV-2 RNA is generally detectable in upper respiratory specimens during the acute phase of infection. The lowest concentration of SARS-CoV-2 viral copies this assay can detect is 138 copies/mL. A negative result does not preclude SARS-Cov-2 infection and should not be used as the sole basis for treatment or other patient management decisions. A negative result may occur with  improper specimen collection/handling, submission of specimen other than nasopharyngeal swab, presence of viral mutation(s) within the areas targeted by this assay, and inadequate number of viral copies(<138 copies/mL). A negative result must be combined with clinical observations, patient history, and epidemiological information. The expected result is Negative.  Fact Sheet for Patients:  EntrepreneurPulse.com.au  Fact Sheet for Healthcare Providers:  IncredibleEmployment.be  This test is no t yet approved or cleared by the [place] FDA and  has been authorized for detection and/or diagnosis of SARS-CoV-2 by FDA under an Emergency Use Authorization (EUA). This EUA will remain  in effect (meaning this test can be used) for the duration of the COVID-19 declaration under Section 564(b)(1) of the Act, 21 U.S.C.section 360bbb-3(b)(1), unless the authorization is terminated  or revoked sooner.       Influenza A by PCR NEGATIVE NEGATIVE Final    Influenza B by PCR NEGATIVE NEGATIVE Final    Comment: (NOTE) The Xpert Xpress SARS-CoV-2/FLU/RSV plus assay is intended as an aid in the diagnosis of influenza from Nasopharyngeal swab specimens and should not be used as a sole basis for treatment. Nasal washings and aspirates are unacceptable for Xpert Xpress SARS-CoV-2/FLU/RSV testing.  Fact Sheet for Patients: EntrepreneurPulse.com.au  Fact Sheet for Healthcare Providers: IncredibleEmployment.be  This test is not yet approved or cleared by the [place] FDA and has been authorized for detection and/or diagnosis of SARS-CoV-2 by FDA under an Emergency Use Authorization (EUA). This EUA will remain in effect (meaning this test can be used) for the duration of the COVID-19 declaration under Section 564(b)(1) of the Act, 21 U.S.C. section 360bbb-3(b)(1), unless the authorization is terminated or revoked.  Performed at [facility], [place] [address] [place], [place] [address]   MRSA PCR Screening     Status: None   Collection Time: 10/13/20  7:57 AM   Specimen: Nasopharyngeal  Result Value Ref Range Status   MRSA  by PCR NEGATIVE NEGATIVE Final    Comment:        The GeneXpert MRSA Assay (FDA approved for NASAL specimens only), is one component of a comprehensive MRSA colonization surveillance program. It is not intended to diagnose MRSA infection nor to guide or monitor treatment for MRSA infections. Performed at [facility], [place] [address] [place], [place] [address]   Culture, Urine     Status: Abnormal   Collection Time: 10/19/20  5:57 AM   Specimen: Urine, Random  Result Value Ref Range Status   Specimen Description URINE, RANDOM  Final   Special Requests   Final    NONE Performed at [facility], [place] [address] [place], [place] [address]    Culture >=100,000 COLONIES/mL KLEBSIELLA PNEUMONIAE (A)  Final   Report Status 10/21/2020 FINAL  Final   Organism  ID, Bacteria KLEBSIELLA PNEUMONIAE (A)  Final      Susceptibility   Klebsiella pneumoniae - MIC*    AMPICILLIN >=32 RESISTANT Resistant     CEFAZOLIN <=4 SENSITIVE Sensitive     CEFEPIME <=0.12 SENSITIVE Sensitive     CEFTRIAXONE <=0.25 SENSITIVE Sensitive     CIPROFLOXACIN <=0.25 SENSITIVE Sensitive     GENTAMICIN <=1 SENSITIVE Sensitive     IMIPENEM <=0.25 SENSITIVE Sensitive     NITROFURANTOIN 64 INTERMEDIATE Intermediate     TRIMETH/SULFA <=20 SENSITIVE Sensitive     AMPICILLIN/SULBACTAM 4 SENSITIVE Sensitive     PIP/TAZO <=4 SENSITIVE Sensitive     * >=100,000 COLONIES/mL KLEBSIELLA PNEUMONIAE        Radiology Studies: No results found.    Scheduled Meds: . amiodarone  200 mg Oral Daily  . bethanechol  5 mg Oral TID  . Chlorhexidine Gluconate Cloth  6 each Topical Daily  . digoxin  125 mcg Oral Daily  . enoxaparin (LOVENOX) injection  60 mg Subcutaneous BID  . feeding supplement  1 Container Oral TID BM  . midodrine  10 mg Oral TID WC  . pantoprazole  40 mg Oral BID  . sodium chloride flush  10-40 mL Intracatheter Q12H  . warfarin  5 mg Oral ONCE-1600  . Warfarin - Pharmacist Dosing Inpatient   Does not apply q1600   Continuous Infusions: . sodium chloride Stopped (10/20/20 0618)  . cefTRIAXone (ROCEPHIN)  IV 1 g (10/21/20 0527)     LOS: 9 days   Time spent= 35 mins   Dede Query,  RN NP-Student   If 7PM-7AM, please contact night-coverage  10/21/2020, 2:36 PM

## 2020-10-21 NOTE — Progress Notes (Signed)
ANTICOAGULATION CONSULT NOTE - Initial Consult  Pharmacy Consult for IV Heparin>>lovenox/coumadin Indication: atrial fibrillation and Mechanical valve  No Known Allergies  Patient Measurements: Height: 5\' 7"  (170.2 cm) Weight: 61.2 kg (134 lb 14.7 oz) IBW/kg (Calculated) : 61.6 Heparin Dosing Weight: 59.3 kg  Vital Signs: Temp: 98.5 F (36.9 C) (02/26 0735) Temp Source: Oral (02/26 0735) BP: 114/66 (02/26 0346) Pulse Rate: 76 (02/26 0346)  Labs: Recent Labs    10/18/20 2222 10/19/20 0325 10/19/20 0325 10/20/20 0243 10/21/20 0311  HGB  --  8.7*   < > 8.9* 8.8*  HCT  --  26.2*  --  27.3* 26.6*  PLT  --  152  --  194 201  LABPROT  --  14.5  --  14.6 14.0  INR  --  1.2  --  1.2 1.1  HEPARINUNFRC 0.38 0.45  --  0.43  --   CREATININE  --   --   --   --  0.92   < > = values in this interval not displayed.    Estimated Creatinine Clearance: 50.3 mL/min (by C-G formula based on SCr of 0.92 mg/dL).  Assessment: 75 year old female on Warfarin prior to admission for mechanical valve and atrial fibrillation admitted 2/17 with lower GIB. Clarified home warfarin dose - INR was 5.5 on 2/16 and put on hold per MD then got admitted. 6 mg Mon Thu, 5 mg AOD  Coumadin was resumed 2/24 for her MVR with Lovenox bridge. Since she was supratherapeutic on her previous home dose, we will reduce the coumadin recommendation on DC.   2/26 - INR remains subtherapeutic and down from yesterday at 1.1. No s/sx bleed noted, Hgb low but stable. Of note PO intake has increased with 100% of meals charted yesterday.  Will cautiously increase warfarin dose to reach goal and monitor closely given high risk of bleed + DDI w/ amiodarone.   Goal of Therapy:  Lovenox 0.6-1 INR 2.5 - 3 Monitor platelets by anticoagulation protocol: Yes   Plan:  Lovenox 60mg  SQ BID Coumadin 5mg  PO x1 today  Monitor daily CBC and s/sx bleeding  Mercy Riding, PharmD PGY1 Acute Care Pharmacy Resident Please refer to  [facility] for unit-specific pharmacist

## 2020-10-22 DIAGNOSIS — R791 Abnormal coagulation profile: Secondary | ICD-10-CM | POA: Diagnosis not present

## 2020-10-22 DIAGNOSIS — I482 Chronic atrial fibrillation, unspecified: Secondary | ICD-10-CM | POA: Diagnosis not present

## 2020-10-22 DIAGNOSIS — K922 Gastrointestinal hemorrhage, unspecified: Secondary | ICD-10-CM | POA: Diagnosis not present

## 2020-10-22 LAB — BASIC METABOLIC PANEL
Anion gap: 11 (ref 5–15)
BUN: 10 mg/dL (ref 8–23)
CO2: 25 mmol/L (ref 22–32)
Calcium: 8.6 mg/dL — ABNORMAL LOW (ref 8.9–10.3)
Chloride: 101 mmol/L (ref 98–111)
Creatinine, Ser: 0.82 mg/dL (ref 0.44–1.00)
GFR, Estimated: >60 mL/min (ref >60)
Glucose, Bld: 84 mg/dL (ref 70–99)
Potassium: 3.6 mmol/L (ref 3.5–5.1)
Sodium: 137 mmol/L (ref 135–145)

## 2020-10-22 LAB — CBC
HCT: 29.1 % — ABNORMAL LOW (ref 36.0–46.0)
Hemoglobin: 9 g/dL — ABNORMAL LOW (ref 12.0–15.0)
MCH: 29.7 pg (ref 26.0–34.0)
MCHC: 30.9 g/dL (ref 30.0–36.0)
MCV: 96 fL (ref 80.0–100.0)
Platelets: 269 10*3/uL (ref 150–400)
RBC: 3.03 MIL/uL — ABNORMAL LOW (ref 3.87–5.11)
RDW: 16.3 % — ABNORMAL HIGH (ref 11.5–15.5)
WBC: 5.9 10*3/uL (ref 4.0–10.5)
nRBC: 0 % (ref 0.0–0.2)

## 2020-10-22 LAB — PROTIME-INR
INR: 1.2 (ref 0.8–1.2)
Prothrombin Time: 14.4 seconds (ref 11.4–15.2)

## 2020-10-22 MED ORDER — WARFARIN SODIUM 5 MG PO TABS
5.0000 mg | ORAL_TABLET | Freq: Once | ORAL | Status: AC
  Administered 2020-10-22: 5 mg via ORAL
  Filled 2020-10-22: qty 1

## 2020-10-22 NOTE — Progress Notes (Signed)
PROGRESS NOTE    Angela [name]  MEQ:[id number] DOB: September 27, 1945 DOA: 10/12/2020 PCP: Lorene Dy, MD   Brief Narrative:   Patient is a 75 yo female who presented with chest pain, weakness, and rectal bleeding.  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  GI and IR consulted. Patient underwent embolization for bleeding in distal colon. Developed hypotension and was transferred to the ICU and placed on vasopressors.  GI has resolved with hemoglobin stabilized.  Assessment & Plan:   Principal Problem:   Acute lower GI hemorrhage Active Problems:   Atrial fibrillation (HCC)   Chronic combined systolic and diastolic heart failure (HCC)   Memory deficit   ICD (implantable cardioverter-defibrillator), biventricular, in situ   Hemorrhagic shock (HCC)   GIB (gastrointestinal bleeding)   Lower abdominal pain   Resolved diverticular bleeding with hemorrhagic shock Possible changes of cirrhosis on CT abdomen -10/12/20 CT angiogram is positive for GI bleeding involving the descending colon, presumably diverticular disease. -S/P IR coil embolization of distal branch of left colic artery -Repeat CT abd/pelvis 10/14/20, shows no acute findings -Last reported bloody BM on 10/15/20 -Hgb stable at 9.0, no signs of bleeding -Trend H/H   Klebsiella pneumonia UTI -2/24 UA positive for pyuria and urine culture 10/19/20 positive for Klebsiella pneumonia > 100,000 colonies -Started on Rocephin empirically while pending culture and sensitivities. -C/S results were sensitive to cefazolin, continue Keflex 500 mg BID  Abdominal pain, in setting of Klebsiella pneumonia UTI -Urine culture 10/19/20 positive for Klebsiella pneumonia, see treatment above. Symptoms have improved. -Consider bentyl if pain recurs.  Acute Urinary Retention -During hospital course, several episodes of urinary retention with Foley catheter placement. -Current has a Foley in place as of 10/18/20 to remain for 7  days with urology outpatient follow-up for voiding trial. -Crt and BUN WNL.  Acute blood loss anemia -No evidence of active bleeding -Transfuse for Hgb of < 8, with significant cardiac disease and decreased EF -Trend H/H  Hx of a fib., mechanical MVR -Initially coumadin paused due to acute blood loss anemia, however with risk for CVA with MVR a re-challenge of heparin was started on 2/21, since no bleeding or change in Hgb noted Coumadin was started on 10/19/20 -Pharmacy to bridge coumadin with subcutaneous Lovenox. -INR remains subtherapeutic at 1.2.  No s/s bleeding, Hgb 9.0, but stable. -Continue Lovenox 60mg  SQ BID. Coumadin5mg  PO x1 today.   -Pharmacy may increase coumadin dose tomorrow if INR remains the same.   Subtherapeutic INR -INR 1.2, remains subtherapeutic -Goal INR 2.5-3.0 -Pharmacy to bridge coumadin with subcutaneous Lovenox. -INR remains subtherapeutic at 1.2.  No s/s bleeding, Hgb 9.0, but stable. -Continue Lovenox 60mg  SQ BID. Coumadin5mg  PO x1 today.   -Pharmacy may increase coumadin dose tomorrow if INR remains the same.    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, SSS, VF s/p ICD -Continue digoxin and amiodarone -Continue strict I/O's, daily weights -Net I/O -620  Chronic hypotension -SBP 90-100's, stable -Continue midodrine 10 mg TID -Receive 1x dose 50 mg hydrocortisone 2/21  Thrombocytopenia, resolved, in setting of acute illness -Platelets normalized at 269  Chronic pain, dementia/memory deficit -Delirium precautions -prn morphine  Chronic Rt hydronephrosis -Monitor urine output and BMP -Replace electrolytes as needed -Avoid nephrotoxic agents  Hypokalemia, resolved -Currently 3.6, continue to trend BMP  Hypomagnesemia, resolved  -Currently 2.0, up from 1.5.  Moderate protein calorie malnutrition -Albumin 2.2, BMI 21 -Moderate muscle loss -Encourage protein calorie intake  Physical debility -PT/OT recommendation for  SNF -Patient wants home with home health -Continue PT/OT as inpatient  DVT prophylaxis: coumadin/Lovenox Code Status: Full Family Communication:  None at bedside  Status is: Inpatient   Dispo:  Patient From: Home  Planned Disposition: Home with Health Care Svc  Medically stable for discharge: No     Body mass index is 20.03 kg/m.   Consultants:   Pharmacy  GI  IR  Procedures:   2/17 IR mesenteric angiogram and embolism descending colon  Antimicrobials:   Keflex   Subjective:  Seen and examined at bedside, no new complaints today  Review of Systems Otherwise negative except as per HPI, including: General: Denies fever, chills, night sweats or unintended weight loss. Resp: Denies cough, wheezing, shortness of breath. Cardiac: Denies chest pain, palpitations, orthopnea, paroxysmal nocturnal dyspnea. GI: Denies abdominal pain, nausea, vomiting, diarrhea or constipation GU: Denies dysuria, frequency, hesitancy or incontinence MS: Denies muscle aches, joint pain or swelling Neuro: Denies headache, neurologic deficits (focal weakness, numbness, tingling), abnormal gait Psych: Denies anxiety, depression, SI/HI/AVH Skin: Denies new rashes or lesions ID: Denies sick contacts, exotic exposures, travel  Examination:  General exam: Appears calm and comfortable  Respiratory system: Clear to auscultation. Respiratory effort normal. Cardiovascular system: S1 & S2 heard, RRR. No JVD, murmurs, rubs, gallops or clicks. No pedal edema. Gastrointestinal system: Abdomen is nondistended, soft and nontender. No organomegaly or masses felt. Normal bowel sounds heard. Central nervous system: Alert and oriented. No focal neurological deficits. Extremities: Symmetric 5 x 5 power. Skin: No rashes, lesions or ulcers Psychiatry: Judgement and insight appear normal. Mood & affect appropriate.     Objective: Vitals:   10/22/20 0009 10/22/20 0345 10/22/20 0745 10/22/20 0924  BP:  (!) 101/54 (!) 104/55    Pulse: 72 75  76  Resp: 17 20    Temp: 98.6 F (37 C) 98.6 F (37 C) 99.1 F (37.3 C)   TempSrc: Oral Oral Oral   SpO2: 100% 100%    Weight: 58 kg     Height:        Intake/Output Summary (Last 24 hours) at 10/22/2020 1023 Last data filed at 10/22/2020 6283 Gross per 24 hour  Intake 1030 ml  Output 1650 ml  Net -620 ml   Filed Weights   10/20/20 0128 10/21/20 0109 10/22/20 0009  Weight: 61.3 kg 61.2 kg 58 kg     Data Reviewed:   CBC: Recent Labs  Lab 10/18/20 0317 10/19/20 0325 10/20/20 0243 10/21/20 0311 10/22/20 0351  WBC 8.0 8.1 5.8 5.7 5.9  HGB 8.8* 8.7* 8.9* 8.8* 9.0*  HCT 26.5* 26.2* 27.3* 26.6* 29.1*  MCV 93.6 93.9 95.1 95.3 96.0  PLT 123* 152 194 201 151   Basic Metabolic Panel: Recent Labs  Lab 10/16/20 0458 10/17/20 0443 10/21/20 0311 10/22/20 0351  NA 136 135 138 137  K 3.7 3.7 3.9 3.6  CL 108 106 106 101  CO2 21* 23 24 25   GLUCOSE 96 105* 92 84  BUN 8 11 10 10   CREATININE 0.79 0.83 0.92 0.82  CALCIUM 7.6* 8.2* 8.6* 8.6*  MG 1.5* 2.0  --   --    GFR: Estimated Creatinine Clearance: 53.4 mL/min (by C-G formula based on SCr of 0.82 mg/dL). Liver Function Tests: Recent Labs  Lab 10/16/20 0458  AST 14*  ALT 15  ALKPHOS 38  BILITOT 1.0  PROT 4.5*  ALBUMIN 2.2*   No results for input(s): LIPASE, AMYLASE in the last 168 hours. No results for input(s): AMMONIA in the  last 168 hours. Coagulation Profile: Recent Labs  Lab 10/18/20 0317 10/19/20 0325 10/20/20 0243 10/21/20 0311 10/22/20 0351  INR 1.2 1.2 1.2 1.1 1.2   Cardiac Enzymes: No results for input(s): CKTOTAL, CKMB, CKMBINDEX, TROPONINI in the last 168 hours. BNP (last 3 results) No results for input(s): PROBNP in the last 8760 hours. HbA1C: No results for input(s): HGBA1C in the last 72 hours. CBG: Recent Labs  Lab 10/16/20 0818 10/16/20 1136 10/16/20 1515 10/17/20 0750 10/17/20 1232  GLUCAP 97 90 132* 83 84   Lipid Profile: No results  for input(s): CHOL, HDL, LDLCALC, TRIG, CHOLHDL, LDLDIRECT in the last 72 hours. Thyroid Function Tests: No results for input(s): TSH, T4TOTAL, FREET4, T3FREE, THYROIDAB in the last 72 hours. Anemia Panel: No results for input(s): VITAMINB12, FOLATE, FERRITIN, TIBC, IRON, RETICCTPCT in the last 72 hours. Sepsis Labs: No results for input(s): PROCALCITON, LATICACIDVEN in the last 168 hours.  Recent Results (from the past 240 hour(s))  MRSA PCR Screening     Status: None   Collection Time: 10/13/20  7:57 AM   Specimen: Nasopharyngeal  Result Value Ref Range Status   MRSA by PCR NEGATIVE NEGATIVE Final    Comment:        The GeneXpert MRSA Assay (FDA approved for NASAL specimens only), is one component of a comprehensive MRSA colonization surveillance program. It is not intended to diagnose MRSA infection nor to guide or monitor treatment for MRSA infections. Performed at [facility], [place] [address], [place], [place] [address]   Culture, Urine     Status: Abnormal   Collection Time: 10/19/20  5:57 AM   Specimen: Urine, Random  Result Value Ref Range Status   Specimen Description URINE, RANDOM  Final   Special Requests   Final    NONE Performed at [facility], [place] [address] [place], [place] [address]    Culture >=100,000 COLONIES/mL KLEBSIELLA PNEUMONIAE (A)  Final   Report Status 10/21/2020 FINAL  Final   Organism ID, Bacteria KLEBSIELLA PNEUMONIAE (A)  Final      Susceptibility   Klebsiella pneumoniae - MIC*    AMPICILLIN >=32 RESISTANT Resistant     CEFAZOLIN <=4 SENSITIVE Sensitive     CEFEPIME <=0.12 SENSITIVE Sensitive     CEFTRIAXONE <=0.25 SENSITIVE Sensitive     CIPROFLOXACIN <=0.25 SENSITIVE Sensitive     GENTAMICIN <=1 SENSITIVE Sensitive     IMIPENEM <=0.25 SENSITIVE Sensitive     NITROFURANTOIN 64 INTERMEDIATE Intermediate     TRIMETH/SULFA <=20 SENSITIVE Sensitive     AMPICILLIN/SULBACTAM 4 SENSITIVE Sensitive     PIP/TAZO <=4 SENSITIVE  Sensitive     * >=100,000 COLONIES/mL KLEBSIELLA PNEUMONIAE         Radiology Studies: No results found.      Scheduled Meds: . amiodarone  200 mg Oral Daily  . bethanechol  5 mg Oral TID  . cephALEXin  500 mg Oral Q12H  . Chlorhexidine Gluconate Cloth  6 each Topical Daily  . digoxin  125 mcg Oral Daily  . enoxaparin (LOVENOX) injection  60 mg Subcutaneous BID  . feeding supplement  1 Container Oral TID BM  . midodrine  10 mg Oral TID WC  . pantoprazole  40 mg Oral BID  . sodium chloride flush  10-40 mL Intracatheter Q12H  . warfarin  5 mg Oral ONCE-1600  . Warfarin - Pharmacist Dosing Inpatient   Does not apply q1600   Continuous Infusions: . sodium chloride Stopped (10/20/20 0618)  LOS: 10 days   Time spent= 35 mins    Dede Query, RN NP-student   If 7PM-7AM, please contact night-coverage  10/22/2020, 10:23 AM

## 2020-10-22 NOTE — Progress Notes (Signed)
Physical Therapy Treatment Patient Details Name: Angela [name] MRN: [id number] DOB: 1946/04/28 Today's Date: 10/22/2020    History of Present Illness Pt is 75 y.o. female with medical history significant of chronic systolic CHF with AICD/pacemaker placement; rheumatic heart disease s/p MVR; dementia; colon cancer (1993); iliac artery embolus s/p embolectomy; CVA; and afib on Coumadin presented 10/12/20 with lower GI bleeding. Elevated INR given vitamin K; hypotensive, given 2 units PRBC plus fluids; 2/17 coil embolization via left groin access    PT Comments    Pt maintaining functional mobility. Requiring minimal assist to stand and ambulating x 3 feet with a walker at a moderate assist level. Limited in further distance due to pt fear of falling and request to sit. Trialed quad cane as well, which is the device pt was using PTA. Pt unable to let go from bed rail with opposite hand, so continues to need a device with bilateral hand support. Will continue to progress as tolerated.    Follow Up Recommendations  SNF;Supervision/Assistance - 24 hour (pt family declining, will need HHPT)     Equipment Recommendations  3in1 (PT)    Recommendations for Other Services       Precautions / Restrictions Precautions Precautions: Fall Restrictions Weight Bearing Restrictions: No    Mobility  Bed Mobility Overal bed mobility: Needs Assistance Bed Mobility: Supine to Sit     Supine to sit: Min guard     General bed mobility comments: Max multimodal cues, but pt able to complete without physical assist. Use of bed rail    Transfers Overall transfer level: Needs assistance Equipment used: Rolling walker (2 wheeled);Quad cane Transfers: Sit to/from Stand Sit to Stand: Min assist         General transfer comment: MinA to rise to stand from edge of bed x 2  Ambulation/Gait Ambulation/Gait assistance: Mod assist Gait Distance (Feet): 3 Feet Assistive device: Rolling walker (2  wheeled) Gait Pattern/deviations: Step-to pattern;Decreased stride length;Trunk flexed;Decreased dorsiflexion - left Gait velocity: decreased Gait velocity interpretation: <1.31 ft/sec, indicative of household ambulator General Gait Details: Poor proximity to walker, requiring modA for balance, pt with fear of falling and requesting to sit down so just took pivotal steps from bed to chair   Stairs             Wheelchair Mobility    Modified Rankin (Stroke Patients Only)       Balance Overall balance assessment: Needs assistance Sitting-balance support: No upper extremity supported;Feet supported Sitting balance-Leahy Scale: Fair     Standing balance support: Bilateral upper extremity supported Standing balance-Leahy Scale: Poor Standing balance comment: reliant on RW and A from therapist                            Cognition Arousal/Alertness: Awake/alert Behavior During Therapy: Flat affect Overall Cognitive Status: No family/caregiver present to determine baseline cognitive functioning Area of Impairment: Following commands;Safety/judgement;Problem solving;Orientation                       Following Commands: Follows one step commands inconsistently Safety/Judgement: Decreased awareness of safety;Decreased awareness of deficits Awareness: Intellectual Problem Solving: Difficulty sequencing;Requires verbal cues General Comments: Fear of falling, needs repetition to follow some one step commands      Exercises      General Comments        Pertinent Vitals/Pain Pain Assessment: Faces Faces Pain Scale: Hurts a little bit Pain Location: back  Pain Descriptors / Indicators: Guarding Pain Intervention(s): Monitored during session    Home Living                      Prior Function            PT Goals (current goals can now be found in the care plan section) Acute Rehab PT Goals Patient Stated Goal: get moving better so she can  return home Potential to Achieve Goals: Good    Frequency    Min 3X/week      PT Plan Current plan remains appropriate    Co-evaluation              AM-PAC PT "6 Clicks" Mobility   Outcome Measure  Help needed turning from your back to your side while in a flat bed without using bedrails?: A Little Help needed moving from lying on your back to sitting on the side of a flat bed without using bedrails?: A Little Help needed moving to and from a bed to a chair (including a wheelchair)?: A Lot Help needed standing up from a chair using your arms (e.g., wheelchair or bedside chair)?: A Little Help needed to walk in hospital room?: A Lot Help needed climbing 3-5 steps with a railing? : A Lot 6 Click Score: 15    End of Session Equipment Utilized During Treatment: Gait belt Activity Tolerance: Patient tolerated treatment well Patient left: in chair;with call bell/phone within reach;with chair alarm set Nurse Communication: Mobility status PT Visit Diagnosis: Unsteadiness on feet (R26.81);Muscle weakness (generalized) (M62.81);Difficulty in walking, not elsewhere classified (R26.2)     Time: 8756-4332 PT Time Calculation (min) (ACUTE ONLY): 21 min  Charges:  $Therapeutic Activity: 8-22 mins                     Wyona Almas, PT, DPT Acute Rehabilitation Services Pager [phone] Office [phone]    Deno Etienne 10/22/2020, 4:50 PM

## 2020-10-22 NOTE — Progress Notes (Signed)
ANTICOAGULATION CONSULT NOTE - Initial Consult  Pharmacy Consult for IV Heparin>>lovenox/coumadin Indication: atrial fibrillation and Mechanical valve  No Known Allergies  Patient Measurements: Height: 5\' 7"  (170.2 cm) Weight: 58 kg (127 lb 13.9 oz) IBW/kg (Calculated) : 61.6 Heparin Dosing Weight: 59.3 kg  Vital Signs: Temp: 99.1 F (37.3 C) (02/27 0745) Temp Source: Oral (02/27 0745) BP: 104/55 (02/27 0345) Pulse Rate: 76 (02/27 0924)  Labs: Recent Labs    10/20/20 0243 10/21/20 0311 10/22/20 0351  HGB 8.9* 8.8* 9.0*  HCT 27.3* 26.6* 29.1*  PLT 194 201 269  LABPROT 14.6 14.0 14.4  INR 1.2 1.1 1.2  HEPARINUNFRC 0.43  --   --   CREATININE  --  0.92 0.82    Estimated Creatinine Clearance: 53.4 mL/min (by C-G formula based on SCr of 0.82 mg/dL).  Assessment: 75 year old female on Warfarin prior to admission for mechanical valve and atrial fibrillation admitted 2/17 with lower GIB. Clarified home warfarin dose - INR was 5.5 on 2/16 and put on hold per MD then got admitted. 6 mg Mon Thu, 5 mg AOD  Coumadin was resumed 2/24 for her MVR with Lovenox bridge. Since she was supratherapeutic on her previous home dose, we will reduce the coumadin recommendation on DC.   2/27 - INR remains subtherapeutic at 1.2. No s/sx bleed noted, Hgb low but stable. Given patient's history, will repeat dose today and consider another increase tomorrow if INR remains the same.   Goal of Therapy:  INR 2.5 - 3 Monitor platelets by anticoagulation protocol: Yes   Plan:  Lovenox 60mg  SQ BID Coumadin 5mg  PO x1 today  Monitor daily CBC and s/sx bleeding  Mercy Riding, PharmD PGY1 Acute Care Pharmacy Resident Please refer to [facility] for unit-specific pharmacist

## 2020-10-23 DIAGNOSIS — K922 Gastrointestinal hemorrhage, unspecified: Secondary | ICD-10-CM | POA: Diagnosis not present

## 2020-10-23 LAB — CBC
HCT: 29.2 % — ABNORMAL LOW (ref 36.0–46.0)
Hemoglobin: 8.9 g/dL — ABNORMAL LOW (ref 12.0–15.0)
MCH: 29.6 pg (ref 26.0–34.0)
MCHC: 30.5 g/dL (ref 30.0–36.0)
MCV: 97 fL (ref 80.0–100.0)
Platelets: 303 10*3/uL (ref 150–400)
RBC: 3.01 MIL/uL — ABNORMAL LOW (ref 3.87–5.11)
RDW: 16.2 % — ABNORMAL HIGH (ref 11.5–15.5)
WBC: 6.7 10*3/uL (ref 4.0–10.5)
nRBC: 0 % (ref 0.0–0.2)

## 2020-10-23 LAB — BASIC METABOLIC PANEL
Anion gap: 8 (ref 5–15)
BUN: 10 mg/dL (ref 8–23)
CO2: 26 mmol/L (ref 22–32)
Calcium: 8.7 mg/dL — ABNORMAL LOW (ref 8.9–10.3)
Chloride: 105 mmol/L (ref 98–111)
Creatinine, Ser: 0.95 mg/dL (ref 0.44–1.00)
GFR, Estimated: >60 mL/min (ref >60)
Glucose, Bld: 84 mg/dL (ref 70–99)
Potassium: 3.9 mmol/L (ref 3.5–5.1)
Sodium: 139 mmol/L (ref 135–145)

## 2020-10-23 LAB — PROTIME-INR
INR: 1.2 (ref 0.8–1.2)
Prothrombin Time: 14.8 seconds (ref 11.4–15.2)

## 2020-10-23 MED ORDER — WARFARIN SODIUM 7.5 MG PO TABS
7.5000 mg | ORAL_TABLET | Freq: Once | ORAL | Status: AC
  Administered 2020-10-23: 7.5 mg via ORAL
  Filled 2020-10-23: qty 1

## 2020-10-23 MED ORDER — POLYETHYLENE GLYCOL 3350 17 G PO PACK
17.0000 g | PACK | Freq: Once | ORAL | Status: AC
  Administered 2020-10-23: 17 g via ORAL
  Filled 2020-10-23: qty 1

## 2020-10-23 NOTE — Progress Notes (Signed)
[place] for IV Heparin>>lovenox/coumadin Indication: atrial fibrillation and Mechanical valve  No Known Allergies  Patient Measurements: Height: 5\' 7"  (170.2 cm) Weight: 59.2 kg (130 lb 8.2 oz) IBW/kg (Calculated) : 61.6 Heparin Dosing Weight: 59.3 kg  Vital Signs: Temp: 98.6 F (37 C) (02/28 0351) Temp Source: Oral (02/28 0351) BP: 110/68 (02/28 0351)  Labs: Recent Labs    10/21/20 0311 10/22/20 0351 10/23/20 0236  HGB 8.8* 9.0* 8.9*  HCT 26.6* 29.1* 29.2*  PLT 201 269 303  LABPROT 14.0 14.4 14.8  INR 1.1 1.2 1.2  CREATININE 0.92 0.82 0.95    Estimated Creatinine Clearance: 47.1 mL/min (by C-G formula based on SCr of 0.95 mg/dL).  Assessment: 75 year old female on Warfarin prior to admission for mechanical valve and atrial fibrillation admitted 2/17 with lower GIB. Clarified home warfarin dose - INR was 5.5 on 2/16 and put on hold per MD then got admitted. 6 mg Mon Thu, 5 mg AOD  Coumadin was resumed 2/24 for her MVR with Lovenox bridge. Since she was supratherapeutic on her previous home dose, we will reduce the coumadin recommendation on DC.   INR today remains subtherapeutic s/p doses of 5mg , H/H low but stable, plts wnl.  SCr stable  Goal of Therapy:  INR 2.5 - 3 Monitor platelets by anticoagulation protocol: Yes   Plan:  Give warfarin 7.5mg  PO x 1 today Continue enoxaparin 60mg  SQ every 12 hours Daily INR, CBC, s/s bleeding  Bertis Ruddy, PharmD Clinical Pharmacist Please check AMION for all [place] numbers 10/23/2020 7:34 AM

## 2020-10-23 NOTE — TOC Progression Note (Signed)
Transition of Care [facility]) - Progression Note    Patient Details  Name: Angela [name] MRN: [id number] Date of Birth: 12/01/1945  Transition of Care [facility]) CM/SW Contact  Leeroy Cha, RN Phone Number: 10/23/2020, 12:58 PM  Clinical Narrative:    3 in 1 ordered through [employer] dme zack blank at 1258.   Expected Discharge Plan: [place] Barriers to Discharge: Continued Medical Work up  Expected Discharge Plan and Services Expected Discharge Plan: [place] arrangements for the past 2 months: Single Family Home                 DME Arranged: 3-N-1 DME Agency: [employer] Date DME Agency Contacted: 10/23/20 Time DME Agency Contacted: [phone] Representative spoke with at DME Agency: [place]: RN,PT,OT,[facility] [place] Work [employer] Agency: [place] Date [place]: 10/20/20 Time [place]: [phone] Representative spoke with at [place]: [place] ([place]) Interventions    Readmission Risk Interventions Readmission Risk Prevention Plan 10/13/2019 01/19/2019 12/05/2018  Transportation Screening Complete Complete -  PCP or Specialist Appt within 3-5 Days - - -  HRI or [place] - Complete Complete  Social Work Consult for [place] Planning/Counseling - Complete Complete  Palliative Care Screening - Complete -  Medication Review Press photographer) Complete Complete -  [place] or Home Care Consult Complete - -  SW Recovery Care/Counseling Consult Complete - -  Palliative Care Screening Not Applicable - -  [place] Not Applicable - -  Some recent data might be hidden

## 2020-10-23 NOTE — Progress Notes (Signed)
Occupational Therapy Treatment Patient Details Name: Angela [name] MRN: [id number] DOB: Jul 25, 1946 Today's Date: 10/23/2020    History of present illness Pt is 75 y.o. female with medical history significant of chronic systolic CHF with AICD/pacemaker placement; rheumatic heart disease s/p MVR; dementia; colon cancer (1993); iliac artery embolus s/p embolectomy; CVA; and afib on Coumadin presented 10/12/20 with lower GI bleeding. Elevated INR given vitamin K; hypotensive, given 2 units PRBC plus fluids; 2/17 coil embolization via left groin access   OT comments  Pt progressing to sit to stands and taking steps to [facility], but when pt stepping forward, pt immediately felt like she was going to fall so pt did better with confidence stepping sideways nearly leaning on bed in standing with RW. Pt limited by decreased strength, decreased ability to follow commands and decreased activity tolerance. Pt's O2 >90% on RA; HR 70s throughout exertion. Pt very agreeable, but continues to have posterior lean with mobility and standing upright. MinA for power up and minA for bed mobility. Pt set-upA at EOB,but maxA overall. Attempted BUE HEP, but pt unable to have the energy. Pt would benefit from continued OT skilled services. OT following.    Follow Up Recommendations  SNF;Supervision/Assistance - 24 hour (able to go home with [facility] with 24/7 care for safety with mobility)    Equipment Recommendations  3 in 1 bedside commode    Recommendations for Other Services      Precautions / Restrictions Precautions Precautions: Fall Restrictions Weight Bearing Restrictions: No       Mobility Bed Mobility Overal bed mobility: Needs Assistance Bed Mobility: Supine to Sit;Sit to Supine     Supine to sit: Min assist Sit to supine: Min assist   General bed mobility comments: minA for LB maneuvering    Transfers Overall transfer level: Needs assistance Equipment used: Rolling walker (2 wheeled);Quad  cane Transfers: Sit to/from Stand Sit to Stand: Min assist         General transfer comment: MinA to rise to stand from edge of bed x 4 times    Balance Overall balance assessment: Needs assistance Sitting-balance support: No upper extremity supported;Feet supported Sitting balance-Leahy Scale: Fair     Standing balance support: Bilateral upper extremity supported Standing balance-Leahy Scale: Poor Standing balance comment: reliant on RW and therapist on L side                           ADL either performed or assessed with clinical judgement   ADL Overall ADL's : Needs assistance/impaired     Grooming: Set up;Sitting;Wash/dry face Grooming Details (indicate cue type and reason): at EOB supported in sitting                             Functional mobility during ADLs: Minimal assistance;Cueing for safety;Cueing for sequencing;Rolling walker General ADL Comments: Pt limited by decreased strength, decreased ability to follow commands and decreased activity tolerance. Pt O2 levels >90% and HR 70-80 throughout exertion.     Vision   Vision Assessment?: No apparent visual deficits   Perception     Praxis      Cognition Arousal/Alertness: Awake/alert Behavior During Therapy: Flat affect Overall Cognitive Status: No family/caregiver present to determine baseline cognitive functioning Area of Impairment: Following commands;Safety/judgement;Problem solving;Orientation                       Following Commands:  Follows one step commands inconsistently Safety/Judgement: Decreased awareness of safety;Decreased awareness of deficits Awareness: Intellectual Problem Solving: Difficulty sequencing;Requires verbal cues General Comments: Requires repetition and very fearful of falling        Exercises     Shoulder Instructions       General Comments O2 >90% on RA; HR 70s throughout exertion. Pt very agreeable, but continues to have posterior  lean with mobility and standing upright    Pertinent Vitals/ Pain       Pain Assessment: Faces Faces Pain Scale: Hurts a little bit Pain Location: back Pain Descriptors / Indicators: Guarding Pain Intervention(s): Monitored during session  Home Living                                          Prior Functioning/Environment              Frequency  Min 2X/week        Progress Toward Goals  OT Goals(current goals can now be found in the care plan section)  Progress towards OT goals: Progressing toward goals  Acute Rehab OT Goals Patient Stated Goal: get moving better so she can return home OT Goal Formulation: With patient Time For Goal Achievement: 11/01/20 Potential to Achieve Goals: [place] Discharge plan remains appropriate    Co-evaluation                 AM-PAC OT "6 Clicks" Daily Activity     Outcome Measure   Help from another person eating meals?: A Little Help from another person taking care of personal grooming?: A Little Help from another person toileting, which includes using toliet, bedpan, or urinal?: A Lot Help from another person bathing (including washing, rinsing, drying)?: A Lot Help from another person to put on and taking off regular upper body clothing?: A Lot Help from another person to put on and taking off regular lower body clothing?: A Lot 6 Click Score: 14    End of Session Equipment Utilized During Treatment: Gait belt;Rolling walker  OT Visit Diagnosis: Unsteadiness on feet (R26.81);Other abnormalities of gait and mobility (R26.89);Muscle weakness (generalized) (M62.81);Other symptoms and signs involving cognitive function   Activity Tolerance Patient tolerated treatment well   Patient Left in bed;with call bell/phone within reach;with bed alarm set   Nurse Communication Mobility status        Time: 5809-9833 OT Time Calculation (min): 26 min  Charges: OT General Charges $OT Visit: 1 Visit OT  Treatments $Self Care/Home Management : 8-22 mins $Therapeutic Activity: 8-22 mins     Jefferey Pica, OTR/L Acute Rehabilitation Services Pager: [phone] Office: [phone]    ALLISON C 10/23/2020, 12:09 PM

## 2020-10-23 NOTE — Progress Notes (Signed)
PROGRESS NOTE    Angela [name]  GMW:[id number] DOB: 1945/09/06 DOA: 10/12/2020 PCP: Lorene Dy, MD    Brief Narrative:  Angela [name] is a 75 year old female with past medical history significant for paroxysmal atrial fibrillation, rheumatic heart disease mechanical mitral valve repair on Coumadin, chronic systolic congestive heart failure with AICD/PPM placement, dementia, colon cancer (1993), iliac artery embolus s/p embolectomy, CVA who presented to the ED with lower GI bleeding.  Patient reported mild lower abdominal pain and feeling chilled.  Patient son noticed that there is blood in the commode, and he believes she had 4 stools that were bright red.  Son also noticed that his mother seemed a little weak.  In the ED, patient was noted to have an elevated INR and vitamin K was administered.  Patient was also noted to have a low blood pressure and was given IV fluid bolus and 2 units of PRBC; with improvement of hypotension.  Gastroenterology was consulted.  Patient was initially managed by the PCCM service and transferred to the hospital service on 10/18/2020.   Assessment & Plan:   Principal Problem:   Acute lower GI hemorrhage Active Problems:   Atrial fibrillation (HCC)   Chronic combined systolic and diastolic heart failure (HCC)   Memory deficit   ICD (implantable cardioverter-defibrillator), biventricular, in situ   Hemorrhagic shock (HCC)   GIB (gastrointestinal bleeding)   Lower abdominal pain   Diverticular bleeding with hemorrhagic shock, resolved Possible changes of cirrhosis on CT abdomen Patient presenting with bright red blood per rectum in the setting of suprapubic therapeutic INR with Coumadin use.  Patient was notably hypotensive initially admitted under the PCCM service.  CT angiogram notable for active bleeding descending colon likely diverticular.  Patient underwent IR guided coil embolization of distal branch of left colic artery on 4/40/3474. Repeat CT  abd/pelvis 10/14/20, shows no acute findings. --Hgb 12.9>>6.5>7.4>>9.0>8.9; stable --transfused 3u pRBC 2/17, 1u 2/18 and 1u 2/19 --transfused 3u FFP 2/17 --Last reported bloody BM on 10/15/20 --monitor CBC daily  Klebsiella pneumonia UTI UA 2/24 positive for pyuria and urine culture 10/19/20 positive for Klebsiella pneumonia > 100,000 colonies.  Initially started on ceftriaxone which was deescalated in accordance with culture susceptibilities.  --Keflex 500 mg p.o. twice daily.  Abdominal pain, in setting of Klebsiella pneumonia UTI Urine culture 10/19/20 positive for Klebsiella pneumonia, see treatment above. Symptoms have improved. --Consider bentyl if pain recurs.  Acute Urinary Retention During hospital course, several episodes of urinary retention with Foley catheter placement. Current has a Foley in place as of 10/18/20 to remain for 7 days with urology outpatient follow-up for voiding trial. --Cr and BUN WNL.  Acute blood loss anemia No evidence of active bleeding --Transfuse for Hgb of < 8, with significant cardiac disease and decreased EF --CBC daily  Hx of a fib., mechanical MVR Initially coumadin paused due to acute blood loss anemia, however with risk for CVA with MVR a re-challenge of heparin was started on 2/21, since no bleeding or change in Hgb noted Coumadin was started on 10/19/20 --Pharmacy to bridge coumadin with subcutaneous Lovenox. --INR remains subtherapeutic at 1.2.  No s/s bleeding, Hgb 8.9, but stable. --ContinueLovenox 60mg  SQ BID.Coumadin7.5mg  PO x1 today.    Subtherapeutic INR INR 4.9 on admission, reversed with vitamin K and FFP as above due to active GI bleeding. --Coumadin now resumed, INR 1.2, remains subtherapeutic (Goal INR 2.5-3.0) --ContinueLovenox 60mg  SQ BID.Coumadin7.5mg  PO x1 today; Pharmacy managing    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,  SSS, VF s/p ICD --Continue digoxin and amiodarone --Continue strict I/O's, daily  weights --Net I/O -174mL past 24h  Chronic hypotension Received 1x dose 50 mg hydrocortisone 2/21 --SBP 90-100's, stable --Continue midodrine 10 mg TID  Thrombocytopenia, resolved, in setting of acute illness --Platelets normalized at 303  Chronic pain, dementia/memory deficit --Delirium precautions --prn morphine  Chronic Rt hydronephrosis --Monitor urine output and BMP --Replace electrolytes as needed --Avoid nephrotoxic agents  Hypokalemia, resolved --Currently 3.9, continue to trend BMP  Hypomagnesemia, resolved --Currently 2.0, up from 1.5.  Moderate protein calorie malnutrition Body mass index is 20.44 kg/m.  --Albumin 2.2 --Moderate muscle loss --Encourage protein calorie intake  Physical debility --PT/OT recommendation for SNF --Patient wants home with home health --Continue PT/OT as inpatient   DVT prophylaxis: Coumadin/Lovenox   Code Status: Full Code Family Communication: Updated patient's son, Angela [name] via telephone this afternoon.  Disposition Plan:  Level of care: Progressive Status is: Inpatient  Remains inpatient appropriate because:Ongoing diagnostic testing needed not appropriate for outpatient work up, Unsafe d/c plan, IV treatments appropriate due to intensity of illness or inability to take PO and Inpatient level of care appropriate due to severity of illness   Dispo:  Patient From: Home  Planned Disposition: Home with Health Care Svc  Medically stable for discharge: No      Consultants:   PCCM  [facility]  Interventional radiology  Procedures:   IR angiogram with embolization left colic branches 4/69/6295, Dr. Dwaine Gale  Antimicrobials:   Ceftriaxone 2/24 - 2/26  Keflex 2/26>>    Subjective: Patient seen and examined at bedside, resting comfortably.  No specific complaints at this time.  Discussed recommendations of discharge to SNF versus home health, patient adamantly desires to discharge back home with therapy when  ready.  No family present at bedside.  Patient denies headache, no fever/chills/night sweats, no chest pain, no shortness of breath, no abdominal pain, no weakness, no fatigue.  No acute events overnight per nursing staff.  Objective: Vitals:   10/23/20 0351 10/23/20 0500 10/23/20 0755 10/23/20 0835  BP: 110/68  114/62   Pulse:   73 74  Resp: 17  19   Temp: 98.6 F (37 C)  98.9 F (37.2 C)   TempSrc: Oral  Oral   SpO2: 99%  99%   Weight:  59.2 kg    Height:        Intake/Output Summary (Last 24 hours) at 10/23/2020 1208 Last data filed at 10/23/2020 0845 Gross per 24 hour  Intake 1200 ml  Output 875 ml  Net 325 ml   Filed Weights   10/22/20 0009 10/23/20 0025 10/23/20 0500  Weight: 58 kg 59.2 kg 59.2 kg    Examination:  General exam: Appears calm and comfortable  Respiratory system: Clear to auscultation. Respiratory effort normal. Cardiovascular system: S1 & S2 heard, RRR. No JVD, murmurs, rubs, gallops or clicks. No pedal edema. Gastrointestinal system: Abdomen is nondistended, soft and nontender. No organomegaly or masses felt. Normal bowel sounds heard. Central nervous system: Alert and oriented. No focal neurological deficits. Extremities: Symmetric 5 x 5 power. Skin: No rashes, lesions or ulcers Psychiatry: Judgement and insight appear normal. Mood & affect appropriate.     Data Reviewed: I have personally reviewed following labs and imaging studies  CBC: Recent Labs  Lab 10/19/20 0325 10/20/20 0243 10/21/20 0311 10/22/20 0351 10/23/20 0236  WBC 8.1 5.8 5.7 5.9 6.7  HGB 8.7* 8.9* 8.8* 9.0* 8.9*  HCT 26.2* 27.3* 26.6* 29.1* 29.2*  MCV  93.9 95.1 95.3 96.0 97.0  PLT 152 194 201 269 086   Basic Metabolic Panel: Recent Labs  Lab 10/17/20 0443 10/21/20 0311 10/22/20 0351 10/23/20 0236  NA 135 138 137 139  K 3.7 3.9 3.6 3.9  CL 106 106 101 105  CO2 23 24 25 26   GLUCOSE 105* 92 84 84  BUN 11 10 10 10   CREATININE 0.83 0.92 0.82 0.95  CALCIUM 8.2*  8.6* 8.6* 8.7*  MG 2.0  --   --   --    GFR: Estimated Creatinine Clearance: 47.1 mL/min (by C-G formula based on SCr of 0.95 mg/dL). Liver Function Tests: No results for input(s): AST, ALT, ALKPHOS, BILITOT, PROT, ALBUMIN in the last 168 hours. No results for input(s): LIPASE, AMYLASE in the last 168 hours. No results for input(s): AMMONIA in the last 168 hours. Coagulation Profile: Recent Labs  Lab 10/19/20 0325 10/20/20 0243 10/21/20 0311 10/22/20 0351 10/23/20 0236  INR 1.2 1.2 1.1 1.2 1.2   Cardiac Enzymes: No results for input(s): CKTOTAL, CKMB, CKMBINDEX, TROPONINI in the last 168 hours. BNP (last 3 results) No results for input(s): PROBNP in the last 8760 hours. HbA1C: No results for input(s): HGBA1C in the last 72 hours. CBG: Recent Labs  Lab 10/16/20 1515 10/17/20 0750 10/17/20 1232  GLUCAP 132* 83 84   Lipid Profile: No results for input(s): CHOL, HDL, LDLCALC, TRIG, CHOLHDL, LDLDIRECT in the last 72 hours. Thyroid Function Tests: No results for input(s): TSH, T4TOTAL, FREET4, T3FREE, THYROIDAB in the last 72 hours. Anemia Panel: No results for input(s): VITAMINB12, FOLATE, FERRITIN, TIBC, IRON, RETICCTPCT in the last 72 hours. Sepsis Labs: No results for input(s): PROCALCITON, LATICACIDVEN in the last 168 hours.  Recent Results (from the past 240 hour(s))  Culture, Urine     Status: Abnormal   Collection Time: 10/19/20  5:57 AM   Specimen: Urine, Random  Result Value Ref Range Status   Specimen Description URINE, RANDOM  Final   Special Requests   Final    NONE Performed at [facility], [address] [place], [place] [address]    Culture >=100,000 COLONIES/mL KLEBSIELLA PNEUMONIAE (A)  Final   Report Status 10/21/2020 FINAL  Final   Organism ID, Bacteria KLEBSIELLA PNEUMONIAE (A)  Final      Susceptibility   Klebsiella pneumoniae - MIC*    AMPICILLIN >=32 RESISTANT Resistant     CEFAZOLIN <=4 SENSITIVE Sensitive     CEFEPIME <=0.12  SENSITIVE Sensitive     CEFTRIAXONE <=0.25 SENSITIVE Sensitive     CIPROFLOXACIN <=0.25 SENSITIVE Sensitive     GENTAMICIN <=1 SENSITIVE Sensitive     IMIPENEM <=0.25 SENSITIVE Sensitive     NITROFURANTOIN 64 INTERMEDIATE Intermediate     TRIMETH/SULFA <=20 SENSITIVE Sensitive     AMPICILLIN/SULBACTAM 4 SENSITIVE Sensitive     PIP/TAZO <=4 SENSITIVE Sensitive     * >=100,000 COLONIES/mL KLEBSIELLA PNEUMONIAE         Radiology Studies: No results found.      Scheduled Meds: . amiodarone  200 mg Oral Daily  . bethanechol  5 mg Oral TID  . cephALEXin  500 mg Oral Q12H  . Chlorhexidine Gluconate Cloth  6 each Topical Daily  . digoxin  125 mcg Oral Daily  . enoxaparin (LOVENOX) injection  60 mg Subcutaneous BID  . feeding supplement  1 Container Oral TID BM  . midodrine  10 mg Oral TID WC  . pantoprazole  40 mg Oral BID  . sodium  chloride flush  10-40 mL Intracatheter Q12H  . warfarin  7.5 mg Oral ONCE-1600  . Warfarin - Pharmacist Dosing Inpatient   Does not apply q1600   Continuous Infusions: . sodium chloride Stopped (10/20/20 0618)     LOS: 11 days    Time spent: 37 minutes spent on chart review, discussion with nursing staff, consultants, updating family and interview/physical exam; more than 50% of that time was spent in counseling and/or coordination of care.    Renlee Floor J [place] DO [facility] Available via Epic secure chat 7am-7pm After these hours, please refer to coverage provider listed on [email or web address] 10/23/2020, 12:08 PM

## 2020-10-23 NOTE — Care Management Important Message (Signed)
Important Message  Patient Details  Name: Angela [name] MRN: [id number] Date of Birth: Jan 23, 1946   Medicare Important Message Given:  Yes     Shelda Altes 10/23/2020, 9:26 AM

## 2020-10-23 NOTE — Progress Notes (Signed)
Pt continues to c/o constipation. Last recorded BM was on 10/17/20. PRN Miralax given last night. Will continue to monitor.

## 2020-10-24 LAB — CBC
HCT: 29.5 % — ABNORMAL LOW (ref 36.0–46.0)
Hemoglobin: 9.6 g/dL — ABNORMAL LOW (ref 12.0–15.0)
MCH: 31 pg (ref 26.0–34.0)
MCHC: 32.5 g/dL (ref 30.0–36.0)
MCV: 95.2 fL (ref 80.0–100.0)
Platelets: 316 10*3/uL (ref 150–400)
RBC: 3.1 MIL/uL — ABNORMAL LOW (ref 3.87–5.11)
RDW: 16.3 % — ABNORMAL HIGH (ref 11.5–15.5)
WBC: 6.8 10*3/uL (ref 4.0–10.5)
nRBC: 0 % (ref 0.0–0.2)

## 2020-10-24 LAB — PROTIME-INR
INR: 1.3 — ABNORMAL HIGH (ref 0.8–1.2)
Prothrombin Time: 15.2 seconds (ref 11.4–15.2)

## 2020-10-24 MED ORDER — BISACODYL 10 MG RE SUPP
10.0000 mg | Freq: Once | RECTAL | Status: AC
  Administered 2020-10-24: 10 mg via RECTAL
  Filled 2020-10-24: qty 1

## 2020-10-24 MED ORDER — WARFARIN SODIUM 7.5 MG PO TABS
7.5000 mg | ORAL_TABLET | Freq: Once | ORAL | Status: AC
  Administered 2020-10-24: 7.5 mg via ORAL
  Filled 2020-10-24: qty 1

## 2020-10-24 NOTE — Progress Notes (Signed)
ANTICOAGULATION CONSULT NOTE - Follow Up Consult  Pharmacy Consult for Warfarin with Enoxaparin bridge Indication: atrial fibrillation and mechanical mitral valve  No Known Allergies  Patient Measurements: Height: 5\' 7"  (170.2 cm) Weight: 57.5 kg (126 lb 12.8 oz) (scale A) IBW/kg (Calculated) : 61.6 Lovenox Dosing Weight: 57.5 kg  Vital Signs: Temp: 98.8 F (37.1 C) (03/01 0815) Temp Source: Oral (03/01 0815) BP: 116/57 (03/01 0815) Pulse Rate: 73 (03/01 0815)  Labs: Recent Labs    10/22/20 0351 10/23/20 0236 10/24/20 0323  HGB 9.0* 8.9* 9.6*  HCT 29.1* 29.2* 29.5*  PLT 269 303 316  LABPROT 14.4 14.8 15.2  INR 1.2 1.2 1.3*  CREATININE 0.82 0.95  --     Estimated Creatinine Clearance: 45.7 mL/min (by C-G formula based on SCr of 0.95 mg/dL).  Assessment: 75 year old female on Warfarin prior to admission for mechanical mitral valve and atrial fibrillation admitted 2/17 with lower GIB and INR 4.9.  Vitamin K 1 mg IV and 3 units FFP given on 2/17.   PTA warfarin dose: 5 mg daily except 6 mg on Mondays and Thursdays   - INR was 5.5 on 2/16 and put on hold per MD then got admitted.    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   Anticoagulation resumed 2/19 with IV heparin > held 2/20 due to maroon BM> resumed 2/21. Warfarin resumed 2/24 and IV heparin changed to Lovenox bridge on 2/25 pm.  On protonix 40 mg PO BID.     INR remains subtherapeutic, 1.2.   Unchanged since Warfarin resumed conservatively with 3 mg on 2/24. Dose increased to 4 mg on 2/25, then 5 mg on 2/26 and 2/27 and 7.5 mg on 2/28.   Goal of Therapy:  Anti-Xa level 0.6-1 units/ml 4hrs after LMWH dose given INR 2.5-3.0 Monitor platelets by anticoagulation protocol: Yes   Plan:   Warfarin 7.5 mg x 1 again today.  Continue Enoxaparin 60 mg SQ q12h.  Daily PT/INR and CBC.  Expect eventual warfarin regimen will need to be lower than prior to admission, since INR was supratherapeutic on  admit.  Arty Baumgartner, RPh 10/24/2020,9:38 AM

## 2020-10-24 NOTE — Progress Notes (Signed)
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  Patient reported mild lower abdominal pain and feeling chilled.  Patient son noticed that there is blood in the commode, and he believes she had 4 stools that were bright red.  Son also noticed that his mother seemed a little weak.  In the ED, patient was noted to have an elevated INR and vitamin K was administered.  Patient was also noted to have a low blood pressure and was given IV fluid bolus and 2 units of PRBC; with improvement of hypotension.  Gastroenterology was consulted.  Patient was initially managed by the PCCM service and transferred to the hospital service on 10/18/2020.   Assessment & Plan:   Principal Problem:   Acute lower GI hemorrhage Active Problems:   Atrial fibrillation (HCC)   Chronic combined systolic and diastolic heart failure (HCC)   Memory deficit   ICD (implantable cardioverter-defibrillator), biventricular, in situ   Hemorrhagic shock (HCC)   GIB (gastrointestinal bleeding)   Lower abdominal pain   Diverticular bleeding with hemorrhagic shock, resolved Possible changes of cirrhosis on CT abdomen Patient presenting with bright red blood per rectum in the setting of suprapubic therapeutic INR with Coumadin use.  Patient was notably hypotensive initially admitted under the PCCM service.  CT angiogram notable for active bleeding descending colon likely diverticular.  Patient underwent IR guided coil embolization of distal branch of left colic artery on 9/38/1017. Repeat CT  abd/pelvis 10/14/20, shows no acute findings. --Hgb 12.9>>6.5>7.4>>9.0>8.9>9.6; stable --transfused 3u pRBC 2/17, 1u 2/18 and 1u 2/19 --transfused 3u FFP 2/17 --Last reported bloody BM on 10/15/20 --monitor CBC daily  Klebsiella pneumonia UTI UA 2/24 positive for pyuria and urine culture 10/19/20 positive for Klebsiella pneumonia > 100,000 colonies.  Initially started on ceftriaxone which was deescalated in accordance with culture susceptibilities.  --Keflex 500 mg p.o. twice daily.  Abdominal pain, in setting of Klebsiella pneumonia UTI Urine culture 10/19/20 positive for Klebsiella pneumonia, see treatment above. Symptoms have improved. --Consider bentyl if pain recurs.  Acute Urinary Retention During hospital course, several episodes of urinary retention with Foley catheter placement. Current has a Foley in place as of 10/18/20 to remain for 7 days with urology outpatient follow-up for voiding trial. --Cr and BUN WNL.  Acute blood loss anemia No evidence of active bleeding --Transfuse for Hgb of < 8, with significant cardiac disease and decreased EF --CBC daily  Hx of a fib., mechanical MVR Initially coumadin paused due to acute blood loss anemia, however with risk for CVA with MVR a re-challenge of heparin was started on 2/21, since no bleeding or change in Hgb noted Coumadin was started on 10/19/20 --Pharmacy to bridge coumadin with subcutaneous Lovenox. --INR remains subtherapeutic at 1.3.  No s/s bleeding, Hgb 8.9, but stable. --ContinueLovenox 60mg  SQ BID.Coumadin7.5mg  PO x1 today.    Subtherapeutic INR INR 4.9 on admission, reversed with vitamin K and FFP as above due to active GI bleeding. --Coumadin now resumed, INR 1.2, remains subtherapeutic (Goal INR 2.5-3.0) --ContinueLovenox 60mg  SQ BID.Coumadin7.5mg  PO x1 today; Pharmacy managing    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,  SSS, VF s/p ICD --Continue digoxin and amiodarone --Continue strict I/O's, daily  weights --Net I/O -141mL past 24h  Chronic hypotension Received 1x dose 50 mg hydrocortisone 2/21 --SBP 90-100's, stable --Continue midodrine 10 mg TID  Thrombocytopenia, resolved, in setting of acute illness --Platelets normalized at 303  Chronic pain, dementia/memory deficit --Delirium precautions --prn morphine  Chronic Rt hydronephrosis --Monitor urine output and BMP --Replace electrolytes as needed --Avoid nephrotoxic agents  Hypokalemia, resolved --Currently 3.9, continue to trend BMP  Hypomagnesemia, resolved --Currently 2.0, up from 1.5.  Moderate protein calorie malnutrition Body mass index is 19.86 kg/m.  --Albumin 2.2 --Moderate muscle loss --Encourage protein calorie intake  Physical debility --PT/OT recommendation for SNF --Patient wants home with home health --Continue PT/OT as inpatient   DVT prophylaxis: Coumadin/Lovenox   Code Status: Full Code Family Communication: Updated patient's son, Angela [name] via telephone this afternoon.  Disposition Plan:  Level of care: Telemetry Medical Status is: Inpatient  Remains inpatient appropriate because:Ongoing diagnostic testing needed not appropriate for outpatient work up, Unsafe d/c plan, IV treatments appropriate due to intensity of illness or inability to take PO and Inpatient level of care appropriate due to severity of illness   Dispo:  Patient From: Home  Planned Disposition: Home with Health Care Svc  Medically stable for discharge: No      Consultants:   PCCM  [facility]  Interventional radiology  Procedures:   IR angiogram with embolization left colic branches 7/74/1287, Dr. Dwaine Gale  Antimicrobials:   Ceftriaxone 2/24 - 2/26  Keflex 2/26>>    Subjective: Patient seen and examined at bedside, resting comfortably.  No specific complaints at this time.  INR remains subtherapeutic.  Patient denies headache, no fever/chills/night sweats, no chest pain, no shortness of breath, no  abdominal pain, no weakness, no fatigue.  No acute events overnight per nursing staff.  Objective: Vitals:   10/24/20 0400 10/24/20 0815 10/24/20 1142 10/24/20 1204  BP:  (!) 116/57 (!) 104/58 111/67  Pulse:  73 73 96  Resp:  19  19  Temp:  98.8 F (37.1 C)  97.8 F (36.6 C)  TempSrc:  Oral  Oral  SpO2: 100% 99%  100%  Weight:      Height:        Intake/Output Summary (Last 24 hours) at 10/24/2020 1220 Last data filed at 10/24/2020 1141 Gross per 24 hour  Intake 698 ml  Output 1366 ml  Net -668 ml   Filed Weights   10/23/20 0025 10/23/20 0500 10/24/20 0002  Weight: 59.2 kg 59.2 kg 57.5 kg    Examination:  General exam: Appears calm and comfortable  Respiratory system: Clear to auscultation. Respiratory effort normal. Cardiovascular system: S1 & S2 heard, RRR. No JVD, murmurs, rubs, gallops or clicks. No pedal edema. Gastrointestinal system: Abdomen is nondistended, soft and nontender. No organomegaly or masses felt. Normal bowel sounds heard. Central nervous system: Alert and oriented. No focal neurological deficits. Extremities: Symmetric 5 x 5 power. Skin: No rashes, lesions or ulcers Psychiatry: Judgement and insight appear normal. Mood & affect appropriate.     Data Reviewed: I have personally reviewed following labs and imaging studies  CBC: Recent Labs  Lab 10/20/20 0243 10/21/20 0311 10/22/20 0351 10/23/20 0236 10/24/20 0323  WBC 5.8 5.7 5.9 6.7 6.8  HGB 8.9* 8.8* 9.0* 8.9* 9.6*  HCT 27.3* 26.6* 29.1* 29.2* 29.5*  MCV 95.1 95.3 96.0 97.0 95.2  PLT 194 201 269 303 867   Basic Metabolic Panel: Recent Labs  Lab  10/21/20 0311 10/22/20 0351 10/23/20 0236  NA 138 137 139  K 3.9 3.6 3.9  CL 106 101 105  CO2 24 25 26   GLUCOSE 92 84 84  BUN 10 10 10   CREATININE 0.92 0.82 0.95  CALCIUM 8.6* 8.6* 8.7*   GFR: Estimated Creatinine Clearance: 45.7 mL/min (by C-G formula based on SCr of 0.95 mg/dL). Liver Function Tests: No results for input(s): AST,  ALT, ALKPHOS, BILITOT, PROT, ALBUMIN in the last 168 hours. No results for input(s): LIPASE, AMYLASE in the last 168 hours. No results for input(s): AMMONIA in the last 168 hours. Coagulation Profile: Recent Labs  Lab 10/20/20 0243 10/21/20 0311 10/22/20 0351 10/23/20 0236 10/24/20 0323  INR 1.2 1.1 1.2 1.2 1.3*   Cardiac Enzymes: No results for input(s): CKTOTAL, CKMB, CKMBINDEX, TROPONINI in the last 168 hours. BNP (last 3 results) No results for input(s): PROBNP in the last 8760 hours. HbA1C: No results for input(s): HGBA1C in the last 72 hours. CBG: Recent Labs  Lab 10/17/20 1232  GLUCAP 84   Lipid Profile: No results for input(s): CHOL, HDL, LDLCALC, TRIG, CHOLHDL, LDLDIRECT in the last 72 hours. Thyroid Function Tests: No results for input(s): TSH, T4TOTAL, FREET4, T3FREE, THYROIDAB in the last 72 hours. Anemia Panel: No results for input(s): VITAMINB12, FOLATE, FERRITIN, TIBC, IRON, RETICCTPCT in the last 72 hours. Sepsis Labs: No results for input(s): PROCALCITON, LATICACIDVEN in the last 168 hours.  Recent Results (from the past 240 hour(s))  Culture, Urine     Status: Abnormal   Collection Time: 10/19/20  5:57 AM   Specimen: Urine, Random  Result Value Ref Range Status   Specimen Description URINE, RANDOM  Final   Special Requests   Final    NONE Performed at [facility], [address] [place], [place] [address]    Culture >=100,000 COLONIES/mL KLEBSIELLA PNEUMONIAE (A)  Final   Report Status 10/21/2020 FINAL  Final   Organism ID, Bacteria KLEBSIELLA PNEUMONIAE (A)  Final      Susceptibility   Klebsiella pneumoniae - MIC*    AMPICILLIN >=32 RESISTANT Resistant     CEFAZOLIN <=4 SENSITIVE Sensitive     CEFEPIME <=0.12 SENSITIVE Sensitive     CEFTRIAXONE <=0.25 SENSITIVE Sensitive     CIPROFLOXACIN <=0.25 SENSITIVE Sensitive     GENTAMICIN <=1 SENSITIVE Sensitive     IMIPENEM <=0.25 SENSITIVE Sensitive     NITROFURANTOIN 64 INTERMEDIATE  Intermediate     TRIMETH/SULFA <=20 SENSITIVE Sensitive     AMPICILLIN/SULBACTAM 4 SENSITIVE Sensitive     PIP/TAZO <=4 SENSITIVE Sensitive     * >=100,000 COLONIES/mL KLEBSIELLA PNEUMONIAE         Radiology Studies: No results found.      Scheduled Meds: . amiodarone  200 mg Oral Daily  . bethanechol  5 mg Oral TID  . cephALEXin  500 mg Oral Q12H  . Chlorhexidine Gluconate Cloth  6 each Topical Daily  . digoxin  125 mcg Oral Daily  . enoxaparin (LOVENOX) injection  60 mg Subcutaneous BID  . feeding supplement  1 Container Oral TID BM  . midodrine  10 mg Oral TID WC  . pantoprazole  40 mg Oral BID  . sodium chloride flush  10-40 mL Intracatheter Q12H  . warfarin  7.5 mg Oral ONCE-1600  . Warfarin - Pharmacist Dosing Inpatient   Does not apply q1600   Continuous Infusions: . sodium chloride Stopped (10/20/20 0618)     LOS: 12 days    Time  spent: 36 minutes spent on chart review, discussion with nursing staff, consultants, updating family and interview/physical exam; more than 50% of that time was spent in counseling and/or coordination of care.    Nohealani Medinger J [place] DO [facility] Available via Epic secure chat 7am-7pm After these hours, please refer to coverage provider listed on [email or web address] 10/24/2020, 12:20 PM

## 2020-10-24 NOTE — Progress Notes (Signed)
Physical Therapy Treatment Patient Details Name: Angela [name] MRN: [id number] DOB: 1945-11-15 Today's Date: 10/24/2020    History of Present Illness Pt is 75 y.o. female with medical history significant of chronic systolic CHF with AICD/pacemaker placement; rheumatic heart disease s/p MVR; dementia; colon cancer (1993); iliac artery embolus s/p embolectomy; CVA; and afib on Coumadin presented 10/12/20 with lower GI bleeding. Elevated INR given vitamin K; hypotensive, given 2 units PRBC plus fluids; 2/17 coil embolization via left groin access    PT Comments    Pt progressing towards her physical therapy goals; agreeable to participate with encouragement. Requiring min assist for functional mobility. Ambulating x 10 feet with a walker. Difficulty advancing left lower extremity with increased inversion during midstance. Fatigues easily. Able to participate in seated exercises for strengthening. Will continue to progress as tolerated.     Follow Up Recommendations  SNF;Supervision/Assistance - 24 hour      Equipment Recommendations  3in1 (PT)    Recommendations for Other Services       Precautions / Restrictions Precautions Precautions: Fall Restrictions Weight Bearing Restrictions: No    Mobility  Bed Mobility Overal bed mobility: Needs Assistance Bed Mobility: Supine to Sit;Sit to Supine     Supine to sit: Min guard Sit to supine: Min assist   General bed mobility comments: Cues for sequencing to progress to edge of bed. minA for LB maneuvering    Transfers Overall transfer level: Needs assistance Equipment used: Rolling walker (2 wheeled);Quad cane Transfers: Sit to/from Stand Sit to Stand: Min assist         General transfer comment: MinA to rise to stand from edge of bed. Initial posterior lean  Ambulation/Gait Ambulation/Gait assistance: Min assist Gait Distance (Feet): 10 Feet Assistive device: Rolling walker (2 wheeled) Gait Pattern/deviations: Step-to  pattern;Decreased stride length;Trunk flexed;Decreased dorsiflexion - left;Decreased step length - left Gait velocity: decreased Gait velocity interpretation: <1.8 ft/sec, indicate of risk for recurrent falls General Gait Details: Pt with increased left foot inversion and decreased step length as well as increased trunk flexion. Cues for stepping initiation and walker proximity. Pt asking to sit after ~10 ft   Stairs             Wheelchair Mobility    Modified Rankin (Stroke Patients Only)       Balance Overall balance assessment: Needs assistance Sitting-balance support: No upper extremity supported;Feet supported Sitting balance-Leahy Scale: Fair     Standing balance support: Bilateral upper extremity supported Standing balance-Leahy Scale: Poor Standing balance comment: reliant on RW and therapist on L side                            Cognition Arousal/Alertness: Awake/alert Behavior During Therapy: Flat affect Overall Cognitive Status: No family/caregiver present to determine baseline cognitive functioning Area of Impairment: Following commands;Safety/judgement;Problem solving;Orientation                       Following Commands: Follows one step commands inconsistently Safety/Judgement: Decreased awareness of safety;Decreased awareness of deficits Awareness: Intellectual Problem Solving: Difficulty sequencing;Requires verbal cues General Comments: requires encouragement to participate      Exercises General Exercises - Lower Extremity Long Arc Quad: Both;10 reps;Seated Hip Flexion/Marching: AROM;AAROM;Both;10 reps;Seated Heel Raises: Both;10 reps;Seated    General Comments        Pertinent Vitals/Pain Pain Assessment: Faces Faces Pain Scale: No hurt    Home Living  Prior Function            PT Goals (current goals can now be found in the care plan section) Acute Rehab PT Goals Patient Stated Goal:  get moving better so she can return home Potential to Achieve Goals: Good Progress towards PT goals: Progressing toward goals    Frequency    Min 3X/week      PT Plan Current plan remains appropriate    Co-evaluation              AM-PAC PT "6 Clicks" Mobility   Outcome Measure  Help needed turning from your back to your side while in a flat bed without using bedrails?: A Little Help needed moving from lying on your back to sitting on the side of a flat bed without using bedrails?: A Little Help needed moving to and from a bed to a chair (including a wheelchair)?: A Little Help needed standing up from a chair using your arms (e.g., wheelchair or bedside chair)?: A Little Help needed to walk in hospital room?: A Little Help needed climbing 3-5 steps with a railing? : A Lot 6 Click Score: 17    End of Session Equipment Utilized During Treatment: Gait belt Activity Tolerance: Patient tolerated treatment well Patient left: in bed;with call bell/phone within reach;with bed alarm set   PT Visit Diagnosis: Unsteadiness on feet (R26.81);Muscle weakness (generalized) (M62.81);Difficulty in walking, not elsewhere classified (R26.2)     Time: 8921-1941 PT Time Calculation (min) (ACUTE ONLY): 15 min  Charges:  $Gait Training: 8-22 mins                     Wyona Almas, PT, DPT Acute Rehabilitation Services Pager [phone] Office [phone]    Deno Etienne 10/24/2020, 2:44 PM

## 2020-10-25 LAB — CBC
HCT: 28.8 % — ABNORMAL LOW (ref 36.0–46.0)
Hemoglobin: 9.3 g/dL — ABNORMAL LOW (ref 12.0–15.0)
MCH: 30.7 pg (ref 26.0–34.0)
MCHC: 32.3 g/dL (ref 30.0–36.0)
MCV: 95 fL (ref 80.0–100.0)
Platelets: 349 10*3/uL (ref 150–400)
RBC: 3.03 MIL/uL — ABNORMAL LOW (ref 3.87–5.11)
RDW: 16.3 % — ABNORMAL HIGH (ref 11.5–15.5)
WBC: 5.7 10*3/uL (ref 4.0–10.5)
nRBC: 0 % (ref 0.0–0.2)

## 2020-10-25 LAB — PROTIME-INR
INR: 1.3 — ABNORMAL HIGH (ref 0.8–1.2)
Prothrombin Time: 15.8 seconds — ABNORMAL HIGH (ref 11.4–15.2)

## 2020-10-25 MED ORDER — WARFARIN SODIUM 7.5 MG PO TABS
7.5000 mg | ORAL_TABLET | Freq: Once | ORAL | Status: AC
  Administered 2020-10-25: 7.5 mg via ORAL
  Filled 2020-10-25: qty 1

## 2020-10-25 MED ORDER — ENOXAPARIN (LOVENOX) PATIENT EDUCATION KIT
PACK | Freq: Once | Status: AC
  Filled 2020-10-25: qty 1

## 2020-10-25 NOTE — Progress Notes (Signed)
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  Patient reported mild lower abdominal pain and feeling chilled.  Patient son noticed that there is blood in the commode, and he believes she had 4 stools that were bright red.  Son also noticed that his mother seemed a little weak.  In the ED, patient was noted to have an elevated INR and vitamin K was administered.  Patient was also noted to have a low blood pressure and was given IV fluid bolus and 2 units of PRBC; with improvement of hypotension.  Gastroenterology was consulted.  Patient was initially managed by the PCCM service and transferred to the hospital service on 10/18/2020.   Assessment & Plan:   Principal Problem:   Acute lower GI hemorrhage Active Problems:   Atrial fibrillation (HCC)   Chronic combined systolic and diastolic heart failure (HCC)   Memory deficit   ICD (implantable cardioverter-defibrillator), biventricular, in situ   Hemorrhagic shock (HCC)   GIB (gastrointestinal bleeding)   Lower abdominal pain   Diverticular bleeding with hemorrhagic shock, resolved Possible changes of cirrhosis on CT abdomen Patient presenting with bright red blood per rectum in the setting of suprapubic therapeutic INR with Coumadin use.  Patient was notably hypotensive initially admitted under the PCCM service.  CT angiogram notable for active bleeding descending colon likely diverticular.  Patient underwent IR guided coil embolization of distal branch of left colic artery on 9/73/5329. Repeat CT  abd/pelvis 10/14/20, shows no acute findings. --Hgb 12.9>>6.5>7.4>>9.0>8.9>9.6>9.3; stable --transfused 3u pRBC 2/17, 1u 2/18 and 1u 2/19 --transfused 3u FFP 2/17 --Last reported bloody BM on 10/15/20 --monitor CBC daily  Klebsiella pneumonia UTI UA 2/24 positive for pyuria and urine culture 10/19/20 positive for Klebsiella pneumonia > 100,000 colonies.  Initially started on ceftriaxone which was deescalated in accordance with culture susceptibilities.  --Keflex 500 mg p.o. twice daily.  Abdominal pain, in setting of Klebsiella pneumonia UTI Urine culture 10/19/20 positive for Klebsiella pneumonia, see treatment above. Symptoms have improved. --Consider bentyl if pain recurs.  Acute Urinary Retention During hospital course, several episodes of urinary retention with Foley catheter placement. Current has a Foley in place as of 10/18/20 to remain for 7 days with urology outpatient follow-up for voiding trial. --Cr and BUN WNL.  Acute blood loss anemia No evidence of active bleeding --Transfuse for Hgb of < 8, with significant cardiac disease and decreased EF --CBC daily  Hx of a fib., mechanical MVR Initially coumadin paused due to acute blood loss anemia, however with risk for CVA with MVR a re-challenge of heparin was started on 2/21, since no bleeding or change in Hgb noted Coumadin was started on 10/19/20 --Pharmacy to bridge coumadin with subcutaneous Lovenox. --INR remains subtherapeutic at 1.3.  --ContinueLovenox 60mg  SQ BID.Coumadin7.5mg  PO x1 today again.    Subtherapeutic INR INR 4.9 on admission, reversed with vitamin K and FFP as above due to active GI bleeding. --Coumadin now resumed, INR 1.3, remains subtherapeutic (Goal INR 2.5-3.0) --ContinueLovenox 60mg  SQ BID.Coumadin7.5mg  PO x1 today; Pharmacy managing    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, SSS, VF s/p ICD --Continue digoxin  and amiodarone --Continue strict I/O's, daily weights --Net I/O -159mL  past 24h  Chronic hypotension Received 1x dose 50 mg hydrocortisone 2/21 --SBP stable --Continue midodrine 10 mg TID  Thrombocytopenia, resolved, in setting of acute illness --Platelets normalized at 349  Chronic pain, dementia/memory deficit --Delirium precautions --prn morphine  Chronic Right hydronephrosis --Monitor urine output and BMP --Replace electrolytes as needed --Avoid nephrotoxic agents  Hypokalemia, resolved --Currently 3.9, continue to trend BMP  Hypomagnesemia, resolved --Currently 2.0, up from 1.5.  Moderate protein calorie malnutrition Body mass index is 20.44 kg/m.  --Albumin 2.2 --Moderate muscle loss --Encourage protein calorie intake  Physical debility --PT/OT recommendation for SNF --Patient wants home with home health --Continue PT/OT as inpatient   DVT prophylaxis: Coumadin/Lovenox   Code Status: Full Code Family Communication: Updated patient's son, Angela [name] via telephone this afternoon.  Disposition Plan:  Level of care: Telemetry Medical Status is: Inpatient  Remains inpatient appropriate because:Ongoing diagnostic testing needed not appropriate for outpatient work up, Unsafe d/c plan, IV treatments appropriate due to intensity of illness or inability to take PO and Inpatient level of care appropriate due to severity of illness   Dispo:  Patient From: Home  Planned Disposition: Home with Health Care Svc  Medically stable for discharge: No      Consultants:   PCCM  [facility]  Interventional radiology  Procedures:   IR angiogram with embolization left colic branches 2/45/8099, Dr. Dwaine Gale  Antimicrobials:   Ceftriaxone 2/24 - 2/26  Keflex 2/26>>    Subjective: Patient seen and examined at bedside, resting comfortably in bedside chair.  No specific complaints at this time.  Discussed with patient possible of going home with Lovenox injections given INR remains subtherapeutic.  At this time, patient does not want to  remain hospitalized and discussed will have nursing staff teach her and her son regarding Lovenox injections so she can go home possibly tomorrow.  Patient denies headache, no fever/chills/night sweats, no chest pain, no shortness of breath, no abdominal pain, no weakness, no fatigue.  No acute events overnight per nursing staff.  Objective: Vitals:   10/24/20 2014 10/24/20 2355 10/25/20 0323 10/25/20 0906  BP: 116/74 (!) 102/56 113/60 (!) 108/54  Pulse: 75 73 72 75  Resp: 18 18 18 20   Temp: 98.5 F (36.9 C) 98.3 F (36.8 C) 98.5 F (36.9 C) 97.8 F (36.6 C)  TempSrc: Oral Oral Oral Oral  SpO2: 90% 97% 94%   Weight:   59.2 kg   Height:        Intake/Output Summary (Last 24 hours) at 10/25/2020 1155 Last data filed at 10/24/2020 2356 Gross per 24 hour  Intake --  Output 400 ml  Net -400 ml   Filed Weights   10/23/20 0500 10/24/20 0002 10/25/20 0323  Weight: 59.2 kg 57.5 kg 59.2 kg    Examination:  General exam: Appears calm and comfortable  Respiratory system: Clear to auscultation. Respiratory effort normal.  Oxygenating well on room air Cardiovascular system: S1 & S2 heard, RRR. No JVD, murmurs, rubs, gallops or clicks. No pedal edema. Gastrointestinal system: Abdomen is nondistended, soft and nontender. No organomegaly or masses felt. Normal bowel sounds heard. Central nervous system: Alert and oriented. No focal neurological deficits. Extremities: Symmetric 5 x 5 power. Skin: No rashes, lesions or ulcers Psychiatry: Judgement and insight appear normal. Mood & affect appropriate.     Data Reviewed: I have personally reviewed following labs and imaging studies  CBC: Recent Labs  Lab 10/21/20 0311 10/22/20  5176 10/23/20 0236 10/24/20 0323 10/25/20 0340  WBC 5.7 5.9 6.7 6.8 5.7  HGB 8.8* 9.0* 8.9* 9.6* 9.3*  HCT 26.6* 29.1* 29.2* 29.5* 28.8*  MCV 95.3 96.0 97.0 95.2 95.0  PLT 201 269 303 316 160   Basic Metabolic Panel: Recent Labs  Lab 10/21/20 0311  10/22/20 0351 10/23/20 0236  NA 138 137 139  K 3.9 3.6 3.9  CL 106 101 105  CO2 24 25 26   GLUCOSE 92 84 84  BUN 10 10 10   CREATININE 0.92 0.82 0.95  CALCIUM 8.6* 8.6* 8.7*   GFR: Estimated Creatinine Clearance: 47.1 mL/min (by C-G formula based on SCr of 0.95 mg/dL). Liver Function Tests: No results for input(s): AST, ALT, ALKPHOS, BILITOT, PROT, ALBUMIN in the last 168 hours. No results for input(s): LIPASE, AMYLASE in the last 168 hours. No results for input(s): AMMONIA in the last 168 hours. Coagulation Profile: Recent Labs  Lab 10/21/20 0311 10/22/20 0351 10/23/20 0236 10/24/20 0323 10/25/20 0340  INR 1.1 1.2 1.2 1.3* 1.3*   Cardiac Enzymes: No results for input(s): CKTOTAL, CKMB, CKMBINDEX, TROPONINI in the last 168 hours. BNP (last 3 results) No results for input(s): PROBNP in the last 8760 hours. HbA1C: No results for input(s): HGBA1C in the last 72 hours. CBG: No results for input(s): GLUCAP in the last 168 hours. Lipid Profile: No results for input(s): CHOL, HDL, LDLCALC, TRIG, CHOLHDL, LDLDIRECT in the last 72 hours. Thyroid Function Tests: No results for input(s): TSH, T4TOTAL, FREET4, T3FREE, THYROIDAB in the last 72 hours. Anemia Panel: No results for input(s): VITAMINB12, FOLATE, FERRITIN, TIBC, IRON, RETICCTPCT in the last 72 hours. Sepsis Labs: No results for input(s): PROCALCITON, LATICACIDVEN in the last 168 hours.  Recent Results (from the past 240 hour(s))  Culture, Urine     Status: Abnormal   Collection Time: 10/19/20  5:57 AM   Specimen: Urine, Random  Result Value Ref Range Status   Specimen Description URINE, RANDOM  Final   Special Requests   Final    NONE Performed at [facility], [address] [place], [place] [address]    Culture >=100,000 COLONIES/mL KLEBSIELLA PNEUMONIAE (A)  Final   Report Status 10/21/2020 FINAL  Final   Organism ID, Bacteria KLEBSIELLA PNEUMONIAE (A)  Final      Susceptibility   Klebsiella  pneumoniae - MIC*    AMPICILLIN >=32 RESISTANT Resistant     CEFAZOLIN <=4 SENSITIVE Sensitive     CEFEPIME <=0.12 SENSITIVE Sensitive     CEFTRIAXONE <=0.25 SENSITIVE Sensitive     CIPROFLOXACIN <=0.25 SENSITIVE Sensitive     GENTAMICIN <=1 SENSITIVE Sensitive     IMIPENEM <=0.25 SENSITIVE Sensitive     NITROFURANTOIN 64 INTERMEDIATE Intermediate     TRIMETH/SULFA <=20 SENSITIVE Sensitive     AMPICILLIN/SULBACTAM 4 SENSITIVE Sensitive     PIP/TAZO <=4 SENSITIVE Sensitive     * >=100,000 COLONIES/mL KLEBSIELLA PNEUMONIAE         Radiology Studies: No results found.      Scheduled Meds: . amiodarone  200 mg Oral Daily  . bethanechol  5 mg Oral TID  . cephALEXin  500 mg Oral Q12H  . Chlorhexidine Gluconate Cloth  6 each Topical Daily  . digoxin  125 mcg Oral Daily  . enoxaparin (LOVENOX) injection  60 mg Subcutaneous BID  . enoxaparin   Does not apply Once  . feeding supplement  1 Container Oral TID BM  . midodrine  10 mg Oral TID WC  .  pantoprazole  40 mg Oral BID  . sodium chloride flush  10-40 mL Intracatheter Q12H  . warfarin  7.5 mg Oral ONCE-1600  . Warfarin - Pharmacist Dosing Inpatient   Does not apply q1600   Continuous Infusions: . sodium chloride Stopped (10/20/20 0618)     LOS: 13 days    Time spent: 36 minutes spent on chart review, discussion with nursing staff, consultants, updating family and interview/physical exam; more than 50% of that time was spent in counseling and/or coordination of care.    Sidda Humm J [place] DO [facility] Available via Epic secure chat 7am-7pm After these hours, please refer to coverage provider listed on [email or web address] 10/25/2020, 11:55 AM

## 2020-10-25 NOTE — Progress Notes (Signed)
Physical Therapy Treatment Patient Details Name: Angela [name] MRN: [id number] DOB: 1945-10-09 Today's Date: 10/25/2020    History of Present Illness Pt is 75 y.o. female with medical history significant of chronic systolic CHF with AICD/pacemaker placement; rheumatic heart disease s/p MVR; dementia; colon cancer (1993); iliac artery embolus s/p embolectomy; CVA; and afib on Coumadin presented 10/12/20 with lower GI bleeding. Elevated INR given vitamin K; hypotensive, given 2 units PRBC plus fluids; 2/17 coil embolization via left groin access    PT Comments    Pt maintaining level of functional mobility. Focused on seated exercises including left ankle stretching prior to gait training. Pt ambulating x 10 ft with a walker and min assist. Pt demonstrates continued gait abnormalities, decreased endurance, weakness and balance deficits. Pt son present to view pt current status and still prefers to take pt home. Will continue to follow acutely.    Follow Up Recommendations  SNF;Supervision/Assistance - 24 hour     Equipment Recommendations  3in1 (PT)    Recommendations for Other Services       Precautions / Restrictions Precautions Precautions: Fall Restrictions Weight Bearing Restrictions: No    Mobility  Bed Mobility Overal bed mobility: Needs Assistance Bed Mobility: Sit to Supine       Sit to supine: Min assist   General bed mobility comments: MinA for LE management back into bed    Transfers Overall transfer level: Needs assistance Equipment used: Rolling walker (2 wheeled);Quad cane Transfers: Sit to/from [employer] to Stand: Min assist Stand pivot transfers: Min assist       General transfer comment: Cues for foot positioning and scooting forward to edge of seat, minA to rise to stand  Ambulation/Gait Ambulation/Gait assistance: Herbalist (Feet): 10 Feet Assistive device: Rolling walker (2 wheeled) Gait Pattern/deviations: Step-to  pattern;Decreased stride length;Trunk flexed;Decreased dorsiflexion - left;Decreased step length - left Gait velocity: decreased Gait velocity interpretation: <1.8 ft/sec, indicate of risk for recurrent falls General Gait Details: Cues for stepping initiation, walker proximity, sequencing/direction. MinA for balance. Pt with decreased left foot inversion today   Stairs             Wheelchair Mobility    Modified Rankin (Stroke Patients Only)       Balance Overall balance assessment: Needs assistance Sitting-balance support: No upper extremity supported;Feet supported Sitting balance-Leahy Scale: Fair     Standing balance support: Bilateral upper extremity supported Standing balance-Leahy Scale: Poor Standing balance comment: reliant on RW and therapist on L side grasping her arm                            Cognition Arousal/Alertness: Awake/alert Behavior During Therapy: Flat affect Overall Cognitive Status: No family/caregiver present to determine baseline cognitive functioning Area of Impairment: Following commands;Safety/judgement;Problem solving                       Following Commands: Follows one step commands inconsistently Safety/Judgement: Decreased awareness of safety;Decreased awareness of deficits Awareness: Intellectual Problem Solving: Difficulty sequencing;Requires verbal cues General Comments: requires encouragement to participate      Exercises General Exercises - Lower Extremity Ankle Circles/Pumps: PROM;Left;5 reps;Seated Long Arc Quad: Both;10 reps;Seated    General Comments        Pertinent Vitals/Pain Pain Assessment: Faces Faces Pain Scale: Hurts a little bit Pain Location: back and l leg Pain Descriptors / Indicators: Grimacing Pain Intervention(s): Monitored during session;Limited activity within patient's tolerance  Home Living                      Prior Function            PT Goals (current  goals can now be found in the care plan section) Acute Rehab PT Goals Patient Stated Goal: I wanna go home Potential to Achieve Goals: Good Progress towards PT goals: Progressing toward goals    Frequency    Min 3X/week      PT Plan Current plan remains appropriate    Co-evaluation              AM-PAC PT "6 Clicks" Mobility   Outcome Measure  Help needed turning from your back to your side while in a flat bed without using bedrails?: A Little Help needed moving from lying on your back to sitting on the side of a flat bed without using bedrails?: A Little Help needed moving to and from a bed to a chair (including a wheelchair)?: A Little Help needed standing up from a chair using your arms (e.g., wheelchair or bedside chair)?: A Little Help needed to walk in hospital room?: A Little Help needed climbing 3-5 steps with a railing? : A Lot 6 Click Score: 17    End of Session Equipment Utilized During Treatment: Gait belt Activity Tolerance: Patient tolerated treatment well Patient left: in bed;with call bell/phone within reach;with bed alarm set;with family/visitor present Nurse Communication: Mobility status PT Visit Diagnosis: Unsteadiness on feet (R26.81);Muscle weakness (generalized) (M62.81);Difficulty in walking, not elsewhere classified (R26.2)     Time: 9242-6834 PT Time Calculation (min) (ACUTE ONLY): 18 min  Charges:  $Gait Training: 8-22 mins                     Wyona Almas, PT, DPT Acute Rehabilitation Services Pager [phone] Office [phone]    Deno Etienne 10/25/2020, 3:09 PM

## 2020-10-25 NOTE — TOC Benefit Eligibility Note (Signed)
Transition of Care [facility]) Benefit Eligibility Note    Patient Details  Name: Jodye Scali MRN: [id number] Date of Birth: 1946/03/30   Medication/Dose: Enoxaparin  30mg .40mg , 100mg .120mg  bid 30 day supply  Covered?: Yes  Tier:  (Tier 4)  Prescription Coverage [facility]erred [facility]: [facility]  Spoke with Person/Company/Phone Number:: Darcella Gasman. [facility] Pharmacy Ph.$ [phone]  Co-Pay: $100.00  Prior Approval: No  Deductible: Unmet       Shelda Altes Phone Number: 10/25/2020, 2:43 PM

## 2020-10-25 NOTE — Progress Notes (Signed)
Lovenox at home teaching kit provided to patient and her son [name]. Education on administering injection given and all questions answered. Son [name] will be here tomorrow morning early, he hopes by 8am, to help administer her AM dose of lovenox. Will request pharmacy to time her AM dose to 10 am so son may give the injection prior to discharge with nurse assistance.

## 2020-10-25 NOTE — TOC Progression Note (Signed)
Transition of Care [facility]) - Progression Note    Patient Details  Name: Yarissa Reining MRN: [id number] Date of Birth: 1945/12/06  Transition of Care [facility]) CM/SW Contact  Zenon Mayo, RN Phone Number: 10/25/2020, 9:59 AM  Clinical Narrative:    NCM spoke with patient regarding the 3 n 1, she states to call her son [name], [place] contacted [name], he states she has a 3 n 1 over her commode all ready.  He would like for the [facility] agency to contact him to arrange the apt times.    Expected Discharge Plan: [place] Barriers to Discharge: Continued Medical Work up  Expected Discharge Plan and Services Expected Discharge Plan: [place] arrangements for the past 2 months: Single Family Home                 DME Arranged: 3-N-1 DME Agency: [employer] Date DME Agency Contacted: 10/23/20 Time DME Agency Contacted: [id number] Representative spoke with at DME Agency: [place]: RN,PT,OT,Nurse's [place] Work [employer] Agency: [place] Date [place]: 10/20/20 Time [place]: 1654 Representative spoke with at [place]: [place] (Accident) Interventions    Readmission Risk Interventions Readmission Risk Prevention Plan 10/13/2019 01/19/2019 12/05/2018  Transportation Screening Complete Complete -  PCP or Specialist Appt within 3-5 Days - - -  HRI or [place] - Complete Complete  Social Work Consult for [place] Planning/Counseling - Complete Complete  Palliative Care Screening - Complete -  Medication Review Press photographer) Complete Complete -  [place] or Home Care Consult Complete - -  SW Recovery Care/Counseling Consult Complete - -  Palliative Care Screening Not Applicable - -  [place] Not Applicable - -  Some recent data might be hidden

## 2020-10-25 NOTE — Progress Notes (Signed)
Occupational Therapy Treatment Patient Details Name: Angela [name] MRN: [id number] DOB: 12-07-1945 Today's Date: 10/25/2020    History of present illness Pt is 75 y.o. female with medical history significant of chronic systolic CHF with AICD/pacemaker placement; rheumatic heart disease s/p MVR; dementia; colon cancer (1993); iliac artery embolus s/p embolectomy; CVA; and afib on Coumadin presented 10/12/20 with lower GI bleeding. Elevated INR given vitamin K; hypotensive, given 2 units PRBC plus fluids; 2/17 coil embolization via left groin access   OT comments  Patient resistant to most activities and moving, but son was in the room and encouraged her to participate.  Bulk of the session was focused on sit to stand, stand tolerance, stand balance and transfer training for improved independence with toileting.  Patient needed a lot of cueing for RW management, keeping weight forward for balance, and encouragement.  She needs up to Min A with all attempted mobility.  OT will continue efforts in the acute setting, but the son plans on discharge home with [facility] services.  SNF and 24 hour assist as needed is recommended.    Follow Up Recommendations  SNF;Supervision/Assistance - 24 hour    Equipment Recommendations  3 in 1 bedside commode    Recommendations for Other Services      Precautions / Restrictions Precautions Precautions: Fall Restrictions Weight Bearing Restrictions: No       Mobility Bed Mobility                    Transfers Overall transfer level: Needs assistance Equipment used: Rolling walker (2 wheeled);Quad cane Transfers: Sit to/from [employer] to Stand: Min assist Stand pivot transfers: Min assist       General transfer comment: VC's to scoot toward the edge of recliner, lean forward and push from the armrests.    Balance   Sitting-balance support: No upper extremity supported;Feet supported Sitting balance-Leahy Scale: Fair      Standing balance support: Bilateral upper extremity supported Standing balance-Leahy Scale: Poor Standing balance comment: reliant on RW and therapist on L side grasping her arm                           ADL either performed or assessed with clinical judgement   ADL                           Toilet Transfer: Minimal assistance;Moderate assistance;RW;BSC Toilet Transfer Details (indicate cue type and reason): placed BSC 3' from patient         Functional mobility during ADLs: Minimal assistance;Cueing for safety;Cueing for sequencing;Rolling walker General ADL Comments: difficulty advancing LLE especially when taking steps backwards.                       Cognition  Baseline dementia                                                                Pertinent Vitals/ Pain       Pain Assessment: Faces Faces Pain Scale: Hurts a little bit Pain Location: back and l leg Pain Descriptors / Indicators: Grimacing Pain Intervention(s): Monitored during session  Frequency  Min 2X/week        Progress Toward Goals  OT Goals(current goals can now be found in the care plan section)  Progress towards OT goals: Progressing toward goals  Acute Rehab OT Goals Patient Stated Goal: I wanna go home OT Goal Formulation: With patient Time For Goal Achievement: 11/01/20 Potential to Achieve Goals: [place] Discharge plan remains appropriate    Co-evaluation                 AM-PAC OT "6 Clicks" Daily Activity     Outcome Measure   Help from another person eating meals?: A Little Help from another person taking care of personal grooming?: A Little Help from another person toileting, which includes using toliet, bedpan, or urinal?: A Lot Help from another person bathing (including washing, rinsing, drying)?: A Lot Help from another person  to put on and taking off regular upper body clothing?: A Lot Help from another person to put on and taking off regular lower body clothing?: A Lot 6 Click Score: 14    End of Session Equipment Utilized During Treatment: Rolling walker  OT Visit Diagnosis: Unsteadiness on feet (R26.81);Other abnormalities of gait and mobility (R26.89);Muscle weakness (generalized) (M62.81);Other symptoms and signs involving cognitive function   Activity Tolerance Patient tolerated treatment well   Patient Left in chair;with call bell/phone within reach;with chair alarm set;with family/visitor present   Nurse Communication Mobility status        Time: 6144-3154 OT Time Calculation (min): 17 min  Charges: OT General Charges $OT Visit: 1 Visit OT Treatments $Self Care/Home Management : 8-22 mins  10/25/2020  Rich, OTR/L  Acute Rehabilitation Services  Office:  [phone]    Angela [name] 10/25/2020, 12:08 PM

## 2020-10-25 NOTE — Progress Notes (Signed)
ANTICOAGULATION CONSULT NOTE - Follow Up Consult  Pharmacy Consult for Warfarin with Enoxaparin bridge Indication: atrial fibrillation and mechanical mitral valve  No Known Allergies  Patient Measurements: Height: 5\' 7"  (170.2 cm) Weight: 59.2 kg (130 lb 8.2 oz) IBW/kg (Calculated) : 61.6 Lovenox Dosing Weight: 57.5 kg  Vital Signs: Temp: 97.8 F (36.6 C) (03/02 0906) Temp Source: Oral (03/02 0906) BP: 108/54 (03/02 0906) Pulse Rate: 75 (03/02 0906)  Labs: Recent Labs    10/23/20 0236 10/24/20 0323 10/25/20 0340  HGB 8.9* 9.6* 9.3*  HCT 29.2* 29.5* 28.8*  PLT 303 316 349  LABPROT 14.8 15.2 15.8*  INR 1.2 1.3* 1.3*  CREATININE 0.95  --   --     Estimated Creatinine Clearance: 47.1 mL/min (by C-G formula based on SCr of 0.95 mg/dL).  Assessment: 75 year old Angela [name] on Warfarin prior to admission for mechanical mitral valve and atrial fibrillation admitted 2/17 with lower GIB and INR 4.9.  Vitamin K 1 mg IV and 3 units FFP given on 2/17.   PTA warfarin dose: 5 mg daily except 6 mg on Mondays and Thursdays   - INR was 5.5 on 2/16 and put on hold per MD then got admitted.    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   Anticoagulation resumed 2/19 with IV heparin > held 2/20 due to maroon BM> resumed 2/21. Warfarin resumed 2/24 and IV heparin changed to Lovenox bridge on 2/25 pm.  On protonix 40 mg PO BID.     INR remains subtherapeutic,1.3. Little changed since Warfarin resumed conservatively with 3 mg on 2/24. Dose increased to 4 mg on 2/25, then 5 mg on 2/26 and 2/27, then 7.5 mg on 2/28 and 3/1. Hesitant to increase dose; prefer slow rise to therapeutic INR with recent bleeding.  Goal of Therapy:  Anti-Xa level 0.6-1 units/ml 4hrs after LMWH dose given INR 2.5-3.0 Monitor platelets by anticoagulation protocol: Yes   Plan:   Warfarin 7.5 mg x 1 again today.  Continue Enoxaparin 60 mg SQ q12h.  Daily PT/INR and CBC.  Bmet in am.  Expect eventual  warfarin regimen will need to be lower than prior to admission, since INR was supratherapeutic on admit.  Arty Angela Angela [name], RPh 10/25/2020,9:28 AM

## 2020-10-26 ENCOUNTER — Other Ambulatory Visit: Payer: Self-pay | Admitting: Internal Medicine

## 2020-10-26 LAB — CBC
HCT: 31.9 % — ABNORMAL LOW (ref 36.0–46.0)
Hemoglobin: 9.9 g/dL — ABNORMAL LOW (ref 12.0–15.0)
MCH: 29.9 pg (ref 26.0–34.0)
MCHC: 31 g/dL (ref 30.0–36.0)
MCV: 96.4 fL (ref 80.0–100.0)
Platelets: 377 10*3/uL (ref 150–400)
RBC: 3.31 MIL/uL — ABNORMAL LOW (ref 3.87–5.11)
RDW: 16.3 % — ABNORMAL HIGH (ref 11.5–15.5)
WBC: 7.1 10*3/uL (ref 4.0–10.5)
nRBC: 0 % (ref 0.0–0.2)

## 2020-10-26 LAB — PROTIME-INR
INR: 1.5 — ABNORMAL HIGH (ref 0.8–1.2)
Prothrombin Time: 17.1 seconds — ABNORMAL HIGH (ref 11.4–15.2)

## 2020-10-26 LAB — BASIC METABOLIC PANEL
Anion gap: 9 (ref 5–15)
BUN: 14 mg/dL (ref 8–23)
CO2: 25 mmol/L (ref 22–32)
Calcium: 9.2 mg/dL (ref 8.9–10.3)
Chloride: 103 mmol/L (ref 98–111)
Creatinine, Ser: 0.94 mg/dL (ref 0.44–1.00)
GFR, Estimated: >60 mL/min (ref >60)
Glucose, Bld: 91 mg/dL (ref 70–99)
Potassium: 4.1 mmol/L (ref 3.5–5.1)
Sodium: 137 mmol/L (ref 135–145)

## 2020-10-26 MED ORDER — BETHANECHOL CHLORIDE 5 MG PO TABS: 5.0000 mg | ORAL_TABLET | Freq: Three times a day (TID) | ORAL | 0 refills | Status: AC

## 2020-10-26 MED ORDER — ENOXAPARIN SODIUM 60 MG/0.6ML ~~LOC~~ SOLN: 60.0000 mg | Freq: Two times a day (BID) | SUBCUTANEOUS | 0 refills | Status: DC

## 2020-10-26 MED ORDER — WARFARIN SODIUM 6 MG PO TABS: 6.0000 mg | ORAL_TABLET | Freq: Every day | ORAL | Status: DC

## 2020-10-26 MED ORDER — PANTOPRAZOLE SODIUM 40 MG PO TBEC: 40.0000 mg | DELAYED_RELEASE_TABLET | Freq: Every day | ORAL | 0 refills | Status: DC

## 2020-10-26 MED ORDER — WARFARIN SODIUM 7.5 MG PO TABS: 7.5000 mg | ORAL_TABLET | Freq: Every day | ORAL | 0 refills | Status: DC

## 2020-10-26 MED ORDER — WARFARIN SODIUM 5 MG PO TABS: 5.0000 mg | ORAL_TABLET | Freq: Every day | ORAL | Status: AC

## 2020-10-26 MED ORDER — MIDODRINE HCL 10 MG PO TABS: 10.0000 mg | ORAL_TABLET | Freq: Three times a day (TID) | ORAL | 0 refills | Status: AC

## 2020-10-26 MED FILL — ENOXAPARIN SODIUM 60 MG/0.6: 60 | 10 days supply | Qty: 6 | Fill #0

## 2020-10-26 MED FILL — MIDODRINE HCL 10 MG TABLET: 10 | 30 days supply | Qty: 90 | Fill #0

## 2020-10-26 MED FILL — BETHANECHOL 10 MG TABLET: 10 | 30 days supply | Qty: 45 | Fill #0

## 2020-10-26 MED FILL — PANTOPRAZOLE SOD DR 40 MG T: 40 | 90 days supply | Qty: 90 | Fill #0

## 2020-10-26 NOTE — Discharge Summary (Signed)
Physician Discharge Summary  [name] AST:[id number] DOB: 17-Apr-1946 DOA: 10/12/2020  PCP: Lorene Dy, MD  Admit date: 10/12/2020 Discharge date: 10/26/2020  Admitted From: Home Disposition: Home, refused SNF  Recommendations for Outpatient Follow-up:  1. Follow up with PCP on 10/30/2020 for INR check 2. Continue Lovenox 60 mg subcutaneously twice daily until INR therapeutic 3. Discharged on Coumadin 7.5 mg daily for 3 days; then resume home dose and follow-up with PCP for further guidance of Coumadin dosing. 4. Please obtain BMP/CBC in one week 5. 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  Patient reported mild lower abdominal pain and feeling chilled.  Patient son noticed that there is blood in the commode, and he believes she had 4 stools that were bright red.  Son also noticed that his mother seemed a little weak.  In the ED, patient was noted to have an elevated INR and vitamin K was administered.  Patient was also noted to have a low blood pressure and was given IV fluid bolus and 2 units of PRBC; with improvement of hypotension.  Gastroenterology was consulted.  Patient was initially managed by the PCCM service and transferred to the hospital service on 10/18/2020.  Hospital course:  Diverticular bleeding with hemorrhagic shock, resolved Possible changes of  cirrhosis on CT abdomen Patient presenting with bright red blood per rectum in the setting of suprapubic therapeutic INR with Coumadin use.  Patient was notably hypotensive initially admitted under the PCCM service.  CT angiogram notable for active bleeding descending colon likely diverticular.  Patient underwent IR guided coil embolization of distal branch of left colic artery on 9/89/2119. Repeat CT abd/pelvis 10/14/20, shows no acute findings.  Patient was transfused total 5 units of PRBCs and 3 units FFP during hospitalization.  Hemoglobin remained stable and was 9.9 at time of discharge.  Recommend repeat CBC in next PCP visit.  Klebsiella pneumonia UTI UA 2/24 positive for pyuria and urine culture 10/19/20 positive for Klebsiella pneumonia > 100,000 colonies.  Initially started on ceftriaxone which was deescalated in accordance with culture susceptibilities.  Completed course of antibiotics with Keflex during hospitalization.  Abdominal pain, in setting of Klebsiella pneumonia UTI Urine culture 10/19/20 positive for Klebsiella pneumonia, see treatment above. Symptoms have improved.  Acute Urinary Retention During hospital course, several episodes of urinary retention with Foley catheter placement. Current has a Foley in place as of 10/18/20.  Outpatient follow-up urology for voiding trial and further management.  Acute blood loss anemia No evidence of active bleeding.  Hemoglobin 9.9 at time of discharge.  Hx of a fib., mechanical MVR Initially coumadin paused due to acute blood loss anemia, however with risk for CVA with MVR a re-challenge of heparin was started on 2/21, since no bleeding or change in Hgb noted Coumadin was started on 10/19/20.  INR remains subtherapeutic at time of discharge, 1.5.  We will continue Lovenox 60 mg subcutaneously twice daily with Coumadin 7.5 mg p.o. x3 days on Thursday, Friday, and Saturday and  may resume home regimen thereafter.  Needs close follow-up with  her PCP who manages her Coumadin; patient instructed to make appointment for Monday.  Subtherapeutic INR INR 4.9 on admission, reversed with vitamin K and FFP as above due to active GI bleeding. Coumadin now resumed, INR 1.5, remains subtherapeutic (Goal INR 2.5-3.0). ContinueLovenox 60mg  SQ BID until INR therapeutic.  Outpatient follow-up with PCP who manages her anticoagulation.    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, SSS, VF s/p ICD Continue digoxin and amiodarone, carvedilol, furosemide  Chronic hypotension Midodrine 10 mg TID  Thrombocytopenia, resolved, in setting of acute illness Platelets normalized at349  Chronic pain, dementia/memory deficit  Chronic Right hydronephrosis Maintain Foley catheter on discharge, outpatient follow-up with PCP/urology  Hypokalemia, resolved Repleted during hospitalization  Hypomagnesemia, resolved Repleted during hospitalization  Moderate protein calorie malnutrition Body mass index is 20.44 kg/m.  Albumin 2.2, Moderate muscle loss. Encourage protein calorie intake  Physical debility PT/OT recommendation for SNF, patient declined and prefers home health on discharge.  Discharge Diagnoses:  Principal Problem:   Acute lower GI hemorrhage Active Problems:   Atrial fibrillation (HCC)   Chronic combined systolic and diastolic heart failure (HCC)   Memory deficit   ICD (implantable cardioverter-defibrillator), biventricular, in situ   Hemorrhagic shock (HCC)   GIB (gastrointestinal bleeding)   Lower abdominal pain    Discharge Instructions  Discharge Instructions    Call MD for:  difficulty breathing, headache or visual disturbances   Complete by: As directed    Call MD for:  extreme fatigue   Complete by: As directed    Call MD for:  persistant dizziness or light-headedness   Complete by: As directed    Call MD for:  persistant nausea and vomiting   Complete by: As directed    Call MD for:  severe  uncontrolled pain   Complete by: As directed    Call MD for:  temperature >100.4   Complete by: As directed    Diet - low sodium heart healthy   Complete by: As directed    Increase activity slowly   Complete by: As directed      Allergies as of 10/26/2020   No Known Allergies     Medication List    TAKE these medications   alendronate 70 MG tablet Commonly known as: FOSAMAX Take 70 mg by mouth once a week.   ALPRAZolam 0.25 MG tablet Commonly known as: XANAX Take 0.25 mg by mouth at bedtime as needed.   amiodarone 200 MG tablet Commonly known as: Pacerone Take 1 tablet (200 mg total) by mouth daily.   bethanechol 5 MG tablet Commonly known as: URECHOLINE Take 1 tablet (5 mg total) by mouth 3 (three) times daily.   carvedilol 3.125 MG tablet Commonly known as: COREG Take 1 tablet (3.125 mg total) by mouth 2 (two) times daily with a meal.   digoxin 0.125 MG tablet Commonly known as: LANOXIN Take 125 mcg by mouth daily.   enoxaparin 60 MG/0.6ML injection Commonly known as: LOVENOX Inject 0.6 mLs (60 mg total) into the skin 2 (two) times daily for 5 days.   furosemide 20 MG tablet Commonly known as: LASIX Take 20 mg by mouth daily.   midodrine 10 MG tablet Commonly known as: PROAMATINE Take 1 tablet (10 mg total) by mouth 3 (three) times daily with meals. What changed:   medication strength  how much to take  when to take this   oxyCODONE 5 MG immediate release tablet  Commonly known as: Oxy IR/ROXICODONE Take 1 tablet (5 mg total) by mouth every 6 (six) hours as needed for moderate pain.   pantoprazole 40 MG tablet Commonly known as: PROTONIX Take 1 tablet (40 mg total) by mouth daily.   tiZANidine 4 MG tablet Commonly known as: ZANAFLEX Take 4 mg by mouth daily as needed for muscle spasms.   warfarin 7.5 MG tablet Commonly known as: Coumadin Take 1 tablet (7.5 mg total) by mouth daily for 3 days. What changed: You were already taking a medication  with the same name, and this prescription was added. Make sure you understand how and when to take each.   warfarin 5 MG tablet Commonly known as: COUMADIN Take 1 tablet (5 mg total) by mouth daily. 5 mg on Tu,W,F,Sa,Su Start taking on: October 29, 2020 What changed: These instructions start on October 29, 2020. If you are unsure what to do until then, ask your doctor or other care provider.   warfarin 6 MG tablet Commonly known as: COUMADIN Take 1 tablet (6 mg total) by mouth daily. 6 mg Mon Thu Start taking on: October 30, 2020 What changed: These instructions start on October 30, 2020. If you are unsure what to do until then, ask your doctor or other care provider.            Durable Medical Equipment  (From admission, onward)         Start     Ordered   10/23/20 1246  For home use only DME 3 n 1  Once        10/23/20 1246          Follow-up Information    [facility] Follow up.   Specialty: Home Health Services Why: Prudencio Pair, SW Contact information: [place] [address] [phone]        Lorene Dy, MD. Schedule an appointment as soon as possible for a visit on 10/30/2020.   Specialty: Internal Medicine Why: Need INR check Contact information: [address] [place] [address] [phone]        Evans Lance, MD .   Specialty: Cardiology Contact information: [phone] [address] [place] [address] [phone]        [place]. Schedule an appointment as soon as possible for a visit in 2 week(s).   Contact information: [place] [phone]             No Known Allergies  Consultations:  PCCM  [facility]  Interventional radiology   Procedures/Studies: CT ABDOMEN PELVIS W CONTRAST  Result Date: 10/14/2020 CLINICAL DATA:  Abdominal pain.  Nonlocalized. EXAM: CT ABDOMEN AND PELVIS WITH CONTRAST  TECHNIQUE: Multidetector CT imaging of the abdomen and pelvis was performed using the standard protocol following bolus administration of intravenous contrast. CONTRAST:  3mL OMNIPAQUE IOHEXOL 300 MG/ML  SOLN COMPARISON:  CTA abdomen/pelvis 10/12/2020 FINDINGS: Lower chest: Chest heart is markedly enlarged. Basilar atelectasis noted bilaterally with small left pleural effusion. Hepatobiliary: Subtle nodularity of liver contour raises the question of cirrhosis. Noncalcified stones are visualized in the gallbladder, superimposed on vicarious excretion of contrast. No intrahepatic or extrahepatic biliary dilation. Pancreas: No focal mass lesion. No dilatation of the main duct. No intraparenchymal cyst. No peripancreatic edema. Spleen: No splenomegaly. No focal mass lesion. Adrenals/Urinary Tract: No adrenal nodule or mass. Marked right hydronephrosis is stable since prior but progressive when comparing  to 11/26/2018. Nonobstructing renal stone noted posterior calyx, better seen on noncontrast CT from 2 days ago. Overlying cortical thinning is compatible with chronicity. Mild fullness of the left intrarenal collecting system is similar. Areas of cortical scarring noted in the left kidney. No evidence for hydroureter. Urinary bladder is decompressed by Foley catheter. Stomach/Bowel: Stomach is unremarkable. No gastric wall thickening. No evidence of outlet obstruction. Duodenum is normally positioned as is the ligament of Treitz. No small bowel wall thickening. No small bowel dilatation. The appendix is not well visualized, but there is no edema or inflammation in the region of the cecum. No gross colonic mass. No colonic wall thickening. Diverticular changes are noted in the left colon without evidence of diverticulitis. Wall thickening in the mid sigmoid colon likely related to muscular hypertrophy. Vascular/Lymphatic: There is abdominal aortic atherosclerosis without aneurysm. There is no gastrohepatic or  hepatoduodenal ligament lymphadenopathy. No retroperitoneal or mesenteric lymphadenopathy. Portal vein and superior mesenteric vein are patent celiac axis and SMA are opacified. IMA is opacified. No pelvic sidewall lymphadenopathy. Reproductive: The uterus is surgically absent. There is no adnexal mass. Other: No intraperitoneal free fluid. Musculoskeletal: Small right groin hernia contains only fat. Diffuse body wall edema noted lower abdomen and pelvis. No worrisome lytic or sclerotic osseous abnormality. Degenerative changes noted lumbar spine. IMPRESSION: 1. No acute findings in the abdomen or pelvis. No bowel wall thickening. No interloop mesenteric or free intraperitoneal fluid. 2. Subtle nodularity of liver contour raises the question of cirrhosis. 3. Cholelithiasis. 4. Marked right hydronephrosis, similar to 10/12/2020 but mildly progressive compared to 11/26/2018. Overlying cortical thinning compatible with chronic process. No associated hydroureter suggests UPJ obstruction. 5. Nonobstructive right nephrolithiasis. 6. Left colonic diverticulosis without diverticulitis. 7. Small right groin hernia contains only fat. 8. Aortic Atherosclerosis (ICD10-I70.0). Electronically Signed   By: Misty Stanley M.D.   On: 10/14/2020 19:39   IR Angiogram Visceral Selective  Result Date: 10/12/2020 INDICATION: 75 year old woman with acute GI bleed in hypertension requiring pressors presents to interventional radiology for angiogram and possible embolization. CT angiography of the abdomen and pelvis showed acute hemorrhage in the descending colon in a background of diverticulosis. Patient's INR was elevated at 4.9 on presentation. Patient is on Coumadin for mechanical mitral valve. INR improved to 1.9 after administration of 2 units of FFP. Given patient's tenuous condition decision was made to perform angiogram and possible embolization. Central venous catheter was placed in anticipation of blood draws, blood product  administration, and fluid resuscitation. EXAM: 1. Ultrasound-guided access of left common femoral artery. 2. Superior mesenteric angiogram and selective angiogram of proximal jejunal branches 3. Inferior mesenteric angiogram and selective angiogram and embolization of left colic branches 4. Angio-Seal closure of left common femoral artery access 5. Dual lumen central venous catheter placement MEDICATIONS: None ANESTHESIA/SEDATION: Moderate (conscious) sedation was employed during this procedure. A total of Versed 1 mg and Fentanyl 25 mcg was administered intravenously. Moderate Sedation Time: 2 hours and 9 minutes. The patient's level of consciousness and vital signs were monitored continuously by radiology nursing throughout the procedure under my direct supervision. FLUOROSCOPY TIME:  Fluoroscopy Time: 37 minutes 36 seconds (325 mGy). COMPLICATIONS: None immediate. PROCEDURE: Informed consent was obtained from the patient following explanation of the procedure, risks, benefits and alternatives. The patient understands, agrees and consents for the procedure. All questions were addressed. A time out was performed prior to the initiation of the procedure. Maximal barrier sterile technique utilized including caps, mask, sterile gowns, sterile gloves, large sterile drape,  hand hygiene, and chlorhexidine prep. Patient positioned supine on the angiography table. Right neck prepped and draped in the usual sterile fashion. All elements of maximal sterile barrier were utilized including, cap, mask, sterile gown, sterile gloves, large sterile drape, hand scrubbing and 2% Chlorhexidine for skin cleaning. The right internal jugular vein was evaluated with ultrasound and shown to be patent. A permanent ultrasound image was obtained and placed in the patient's medical record. Using sterile gel and a sterile probe cover, the right internal jugular vein was entered with a 21 ga needle during real time ultrasound guidance. 0.018  inch guidewire placed and 21 ga needle exchanged for peel-away sheath Dual lumen central venous catheter was inserted over guidewire. The tip was positioned at the cavoatrial junction. All lumens aspirated and flushed well. The catheter was secured to the skin with suture. The insertion site was was covered with bio patch and sterile dressing. Left groin skin was prepped and draped in the usual sterile fashion. All elements of maximal sterile barrier were utilized including, cap, mask, sterile gown, sterile gloves, large sterile drape, hand scrubbing and 2% Chlorhexidine for skin cleaning. The left common femoral artery was evaluated with ultrasound and shown to be patent. A permanent ultrasound image was obtained and placed in the patient's medical record. Using sterile gel and a sterile probe cover, the left common femoral artery was entered with a 21 ga needle during real time ultrasound guidance. 21 gauge needle removed over 0.018 inch guidewire and replaced with a transitional dilator set. Five [place] dilator exchanged for 5 French sheath over 0.035 inch guidewire. The S1 catheter advanced to the superior mesenteric artery and angiogram was performed. No active extravasation was identified. Utilizing Progreat microcatheter, proximal jejunal branch was selected and angiogram was performed. No flow to the colon was identified. No active extravasation was seen. The inferior mesenteric artery was selected with a Mickelson catheter. Angiogram showed active extravasation in the descending colon. Progreat microcatheter was advanced into the left colic branch and selective angiogram again showed contribution from small branches beyond the marginal artery of [place]. The more superior branch was successfully selected and embolized with a single 2 mm x 2 cm soft interlock coil. Post angiogram demonstrated no significant flow in the embolized artery and no extravasation. Numerous efforts were made to access the more  inferior branch extending to the region of extravasation, however none were successful despite utilizing several different wires and micro catheters. Given that no additional extravasation was identified, decision was made to terminate the procedure without embolizing this more inferior branch. Groin sheath angiogram demonstrated appropriate access of the common femoral artery at the level of the femoral head. The groin was re-prepped and draped and closed with a 6 [place] Angio-Seal device. 2 minutes of additional compression applied to achieve hemostasis. IMPRESSION: Inferior mesenteric arteriogram demonstrates active hemorrhage in the descending colon. Two distal branches beyond the marginal artery of Drum[place] appeared to be supplying the area extravasation. The superior of these 2 branches was selected and embolized with a micro coil. Despite prolonged efforts, the more inferior branch could not be selected for embolization. No active extravasation identified at the end of the procedure. Electronically Signed   By: Miachel Roux M.D.   On: 10/12/2020 20:45   IR Angiogram Visceral Selective  Result Date: 10/12/2020 INDICATION: 75 year old woman with acute GI bleed in hypertension requiring pressors presents to interventional radiology for angiogram and possible embolization. CT angiography of the abdomen and pelvis showed acute hemorrhage in  the descending colon in a background of diverticulosis. Patient's INR was elevated at 4.9 on presentation. Patient is on Coumadin for mechanical mitral valve. INR improved to 1.9 after administration of 2 units of FFP. Given patient's tenuous condition decision was made to perform angiogram and possible embolization. Central venous catheter was placed in anticipation of blood draws, blood product administration, and fluid resuscitation. EXAM: 1. Ultrasound-guided access of left common femoral artery. 2. Superior mesenteric angiogram and selective angiogram of proximal  jejunal branches 3. Inferior mesenteric angiogram and selective angiogram and embolization of left colic branches 4. Angio-Seal closure of left common femoral artery access 5. Dual lumen central venous catheter placement MEDICATIONS: None ANESTHESIA/SEDATION: Moderate (conscious) sedation was employed during this procedure. A total of Versed 1 mg and Fentanyl 25 mcg was administered intravenously. Moderate Sedation Time: 2 hours and 9 minutes. The patient's level of consciousness and vital signs were monitored continuously by radiology nursing throughout the procedure under my direct supervision. FLUOROSCOPY TIME:  Fluoroscopy Time: 37 minutes 36 seconds (325 mGy). COMPLICATIONS: None immediate. PROCEDURE: Informed consent was obtained from the patient following explanation of the procedure, risks, benefits and alternatives. The patient understands, agrees and consents for the procedure. All questions were addressed. A time out was performed prior to the initiation of the procedure. Maximal barrier sterile technique utilized including caps, mask, sterile gowns, sterile gloves, large sterile drape, hand hygiene, and chlorhexidine prep. Patient positioned supine on the angiography table. Right neck prepped and draped in the usual sterile fashion. All elements of maximal sterile barrier were utilized including, cap, mask, sterile gown, sterile gloves, large sterile drape, hand scrubbing and 2% Chlorhexidine for skin cleaning. The right internal jugular vein was evaluated with ultrasound and shown to be patent. A permanent ultrasound image was obtained and placed in the patient's medical record. Using sterile gel and a sterile probe cover, the right internal jugular vein was entered with a 21 ga needle during real time ultrasound guidance. 0.018 inch guidewire placed and 21 ga needle exchanged for peel-away sheath Dual lumen central venous catheter was inserted over guidewire. The tip was positioned at the cavoatrial  junction. All lumens aspirated and flushed well. The catheter was secured to the skin with suture. The insertion site was was covered with bio patch and sterile dressing. Left groin skin was prepped and draped in the usual sterile fashion. All elements of maximal sterile barrier were utilized including, cap, mask, sterile gown, sterile gloves, large sterile drape, hand scrubbing and 2% Chlorhexidine for skin cleaning. The left common femoral artery was evaluated with ultrasound and shown to be patent. A permanent ultrasound image was obtained and placed in the patient's medical record. Using sterile gel and a sterile probe cover, the left common femoral artery was entered with a 21 ga needle during real time ultrasound guidance. 21 gauge needle removed over 0.018 inch guidewire and replaced with a transitional dilator set. Five [place] dilator exchanged for 5 French sheath over 0.035 inch guidewire. The S1 catheter advanced to the superior mesenteric artery and angiogram was performed. No active extravasation was identified. Utilizing Progreat microcatheter, proximal jejunal branch was selected and angiogram was performed. No flow to the colon was identified. No active extravasation was seen. The inferior mesenteric artery was selected with a Mickelson catheter. Angiogram showed active extravasation in the descending colon. Progreat microcatheter was advanced into the left colic branch and selective angiogram again showed contribution from small branches beyond the marginal artery of Drummond. The more superior branch  was successfully selected and embolized with a single 2 mm x 2 cm soft interlock coil. Post angiogram demonstrated no significant flow in the embolized artery and no extravasation. Numerous efforts were made to access the more inferior branch extending to the region of extravasation, however none were successful despite utilizing several different wires and micro catheters. Given that no additional  extravasation was identified, decision was made to terminate the procedure without embolizing this more inferior branch. Groin sheath angiogram demonstrated appropriate access of the common femoral artery at the level of the femoral head. The groin was re-prepped and draped and closed with a 6 [place] Angio-Seal device. 2 minutes of additional compression applied to achieve hemostasis. IMPRESSION: Inferior mesenteric arteriogram demonstrates active hemorrhage in the descending colon. Two distal branches beyond the marginal artery of [place] appeared to be supplying the area extravasation. The superior of these 2 branches was selected and embolized with a micro coil. Despite prolonged efforts, the more inferior branch could not be selected for embolization. No active extravasation identified at the end of the procedure. Electronically Signed   By: Miachel Roux M.D.   On: 10/12/2020 20:45   IR Fluoro Guide CV Line Right  Result Date: 10/12/2020 INDICATION: [id number] year old woman with acute GI bleed in hypertension requiring pressors presents to interventional radiology for angiogram and possible embolization. CT angiography of the abdomen and pelvis showed acute hemorrhage in the descending colon in a background of diverticulosis. Patient's INR was elevated at 4.9 on presentation. Patient is on Coumadin for mechanical mitral valve. INR improved to 1.9 after administration of 2 units of FFP. Given patient's tenuous condition decision was made to perform angiogram and possible embolization. Central venous catheter was placed in anticipation of blood draws, blood product administration, and fluid resuscitation. EXAM: 1. Ultrasound-guided access of left common femoral artery. 2. Superior mesenteric angiogram and selective angiogram of proximal jejunal branches 3. Inferior mesenteric angiogram and selective angiogram and embolization of left colic branches 4. Angio-Seal closure of left common femoral artery access 5. Dual  lumen central venous catheter placement MEDICATIONS: None ANESTHESIA/SEDATION: Moderate (conscious) sedation was employed during this procedure. A total of Versed 1 mg and Fentanyl 25 mcg was administered intravenously. Moderate Sedation Time: 2 hours and 9 minutes. The patient's level of consciousness and vital signs were monitored continuously by radiology nursing throughout the procedure under my direct supervision. FLUOROSCOPY TIME:  Fluoroscopy Time: 37 minutes 36 seconds (325 mGy). COMPLICATIONS: None immediate. PROCEDURE: Informed consent was obtained from the patient following explanation of the procedure, risks, benefits and alternatives. The patient understands, agrees and consents for the procedure. All questions were addressed. A time out was performed prior to the initiation of the procedure. Maximal barrier sterile technique utilized including caps, mask, sterile gowns, sterile gloves, large sterile drape, hand hygiene, and chlorhexidine prep. Patient positioned supine on the angiography table. Right neck prepped and draped in the usual sterile fashion. All elements of maximal sterile barrier were utilized including, cap, mask, sterile gown, sterile gloves, large sterile drape, hand scrubbing and 2% Chlorhexidine for skin cleaning. The right internal jugular vein was evaluated with ultrasound and shown to be patent. A permanent ultrasound image was obtained and placed in the patient's medical record. Using sterile gel and a sterile probe cover, the right internal jugular vein was entered with a 21 ga needle during real time ultrasound guidance. 0.018 inch guidewire placed and 21 ga needle exchanged for peel-away sheath Dual lumen central venous catheter was inserted over guidewire. The  tip was positioned at the cavoatrial junction. All lumens aspirated and flushed well. The catheter was secured to the skin with suture. The insertion site was was covered with bio patch and sterile dressing. Left groin  skin was prepped and draped in the usual sterile fashion. All elements of maximal sterile barrier were utilized including, cap, mask, sterile gown, sterile gloves, large sterile drape, hand scrubbing and 2% Chlorhexidine for skin cleaning. The left common femoral artery was evaluated with ultrasound and shown to be patent. A permanent ultrasound image was obtained and placed in the patient's medical record. Using sterile gel and a sterile probe cover, the left common femoral artery was entered with a 21 ga needle during real time ultrasound guidance. 21 gauge needle removed over 0.018 inch guidewire and replaced with a transitional dilator set. Five [place] dilator exchanged for 5 French sheath over 0.035 inch guidewire. The S1 catheter advanced to the superior mesenteric artery and angiogram was performed. No active extravasation was identified. Utilizing Progreat microcatheter, proximal jejunal branch was selected and angiogram was performed. No flow to the colon was identified. No active extravasation was seen. The inferior mesenteric artery was selected with a Mickelson catheter. Angiogram showed active extravasation in the descending colon. Progreat microcatheter was advanced into the left colic branch and selective angiogram again showed contribution from small branches beyond the marginal artery of [place]. The more superior branch was successfully selected and embolized with a single 2 mm x 2 cm soft interlock coil. Post angiogram demonstrated no significant flow in the embolized artery and no extravasation. Numerous efforts were made to access the more inferior branch extending to the region of extravasation, however none were successful despite utilizing several different wires and micro catheters. Given that no additional extravasation was identified, decision was made to terminate the procedure without embolizing this more inferior branch. Groin sheath angiogram demonstrated appropriate access of the  common femoral artery at the level of the femoral head. The groin was re-prepped and draped and closed with a 6 [place] Angio-Seal device. 2 minutes of additional compression applied to achieve hemostasis. IMPRESSION: Inferior mesenteric arteriogram demonstrates active hemorrhage in the descending colon. Two distal branches beyond the marginal artery of [place] appeared to be supplying the area extravasation. The superior of these 2 branches was selected and embolized with a micro coil. Despite prolonged efforts, the more inferior branch could not be selected for embolization. No active extravasation identified at the end of the procedure. Electronically Signed   By: Miachel Roux M.D.   On: 10/12/2020 20:45   IR US Guide Vasc Access Left  Result Date: 10/12/2020 INDICATION: 75 year old woman with acute GI bleed in hypertension requiring pressors presents to interventional radiology for angiogram and possible embolization. CT angiography of the abdomen and pelvis showed acute hemorrhage in the descending colon in a background of diverticulosis. Patient's INR was elevated at 4.9 on presentation. Patient is on Coumadin for mechanical mitral valve. INR improved to 1.9 after administration of 2 units of FFP. Given patient's tenuous condition decision was made to perform angiogram and possible embolization. Central venous catheter was placed in anticipation of blood draws, blood product administration, and fluid resuscitation. EXAM: 1. Ultrasound-guided access of left common femoral artery. 2. Superior mesenteric angiogram and selective angiogram of proximal jejunal branches 3. Inferior mesenteric angiogram and selective angiogram and embolization of left colic branches 4. Angio-Seal closure of left common femoral artery access 5. Dual lumen central venous catheter placement MEDICATIONS: None ANESTHESIA/SEDATION: Moderate (conscious) sedation was  employed during this procedure. A total of Versed 1 mg and Fentanyl 25  mcg was administered intravenously. Moderate Sedation Time: 2 hours and 9 minutes. The patient's level of consciousness and vital signs were monitored continuously by radiology nursing throughout the procedure under my direct supervision. FLUOROSCOPY TIME:  Fluoroscopy Time: 37 minutes 36 seconds (325 mGy). COMPLICATIONS: None immediate. PROCEDURE: Informed consent was obtained from the patient following explanation of the procedure, risks, benefits and alternatives. The patient understands, agrees and consents for the procedure. All questions were addressed. A time out was performed prior to the initiation of the procedure. Maximal barrier sterile technique utilized including caps, mask, sterile gowns, sterile gloves, large sterile drape, hand hygiene, and chlorhexidine prep. Patient positioned supine on the angiography table. Right neck prepped and draped in the usual sterile fashion. All elements of maximal sterile barrier were utilized including, cap, mask, sterile gown, sterile gloves, large sterile drape, hand scrubbing and 2% Chlorhexidine for skin cleaning. The right internal jugular vein was evaluated with ultrasound and shown to be patent. A permanent ultrasound image was obtained and placed in the patient's medical record. Using sterile gel and a sterile probe cover, the right internal jugular vein was entered with a 21 ga needle during real time ultrasound guidance. 0.018 inch guidewire placed and 21 ga needle exchanged for peel-away sheath Dual lumen central venous catheter was inserted over guidewire. The tip was positioned at the cavoatrial junction. All lumens aspirated and flushed well. The catheter was secured to the skin with suture. The insertion site was was covered with bio patch and sterile dressing. Left groin skin was prepped and draped in the usual sterile fashion. All elements of maximal sterile barrier were utilized including, cap, mask, sterile gown, sterile gloves, large sterile drape,  hand scrubbing and 2% Chlorhexidine for skin cleaning. The left common femoral artery was evaluated with ultrasound and shown to be patent. A permanent ultrasound image was obtained and placed in the patient's medical record. Using sterile gel and a sterile probe cover, the left common femoral artery was entered with a 21 ga needle during real time ultrasound guidance. 21 gauge needle removed over 0.018 inch guidewire and replaced with a transitional dilator set. Five [place] dilator exchanged for 5 French sheath over 0.035 inch guidewire. The S1 catheter advanced to the superior mesenteric artery and angiogram was performed. No active extravasation was identified. Utilizing Progreat microcatheter, proximal jejunal branch was selected and angiogram was performed. No flow to the colon was identified. No active extravasation was seen. The inferior mesenteric artery was selected with a Mickelson catheter. Angiogram showed active extravasation in the descending colon. Progreat microcatheter was advanced into the left colic branch and selective angiogram again showed contribution from small branches beyond the marginal artery of [place]. The more superior branch was successfully selected and embolized with a single 2 mm x 2 cm soft interlock coil. Post angiogram demonstrated no significant flow in the embolized artery and no extravasation. Numerous efforts were made to access the more inferior branch extending to the region of extravasation, however none were successful despite utilizing several different wires and micro catheters. Given that no additional extravasation was identified, decision was made to terminate the procedure without embolizing this more inferior branch. Groin sheath angiogram demonstrated appropriate access of the common femoral artery at the level of the femoral head. The groin was re-prepped and draped and closed with a 6 [place] Angio-Seal device. 2 minutes of additional compression applied to  achieve hemostasis. IMPRESSION:  Inferior mesenteric arteriogram demonstrates active hemorrhage in the descending colon. Two distal branches beyond the marginal artery of [place] appeared to be supplying the area extravasation. The superior of these 2 branches was selected and embolized with a micro coil. Despite prolonged efforts, the more inferior branch could not be selected for embolization. No active extravasation identified at the end of the procedure. Electronically Signed   By: Miachel Roux M.D.   On: 10/12/2020 20:45   IR US Guide Vasc Access Right  Result Date: 10/12/2020 INDICATION: 75 year old woman with acute GI bleed in hypertension requiring pressors presents to interventional radiology for angiogram and possible embolization. CT angiography of the abdomen and pelvis showed acute hemorrhage in the descending colon in a background of diverticulosis. Patient's INR was elevated at 4.9 on presentation. Patient is on Coumadin for mechanical mitral valve. INR improved to 1.9 after administration of 2 units of FFP. Given patient's tenuous condition decision was made to perform angiogram and possible embolization. Central venous catheter was placed in anticipation of blood draws, blood product administration, and fluid resuscitation. EXAM: 1. Ultrasound-guided access of left common femoral artery. 2. Superior mesenteric angiogram and selective angiogram of proximal jejunal branches 3. Inferior mesenteric angiogram and selective angiogram and embolization of left colic branches 4. Angio-Seal closure of left common femoral artery access 5. Dual lumen central venous catheter placement MEDICATIONS: None ANESTHESIA/SEDATION: Moderate (conscious) sedation was employed during this procedure. A total of Versed 1 mg and Fentanyl 25 mcg was administered intravenously. Moderate Sedation Time: 2 hours and 9 minutes. The patient's level of consciousness and vital signs were monitored continuously by radiology nursing  throughout the procedure under my direct supervision. FLUOROSCOPY TIME:  Fluoroscopy Time: 37 minutes 36 seconds (325 mGy). COMPLICATIONS: None immediate. PROCEDURE: Informed consent was obtained from the patient following explanation of the procedure, risks, benefits and alternatives. The patient understands, agrees and consents for the procedure. All questions were addressed. A time out was performed prior to the initiation of the procedure. Maximal barrier sterile technique utilized including caps, mask, sterile gowns, sterile gloves, large sterile drape, hand hygiene, and chlorhexidine prep. Patient positioned supine on the angiography table. Right neck prepped and draped in the usual sterile fashion. All elements of maximal sterile barrier were utilized including, cap, mask, sterile gown, sterile gloves, large sterile drape, hand scrubbing and 2% Chlorhexidine for skin cleaning. The right internal jugular vein was evaluated with ultrasound and shown to be patent. A permanent ultrasound image was obtained and placed in the patient's medical record. Using sterile gel and a sterile probe cover, the right internal jugular vein was entered with a 21 ga needle during real time ultrasound guidance. 0.018 inch guidewire placed and 21 ga needle exchanged for peel-away sheath Dual lumen central venous catheter was inserted over guidewire. The tip was positioned at the cavoatrial junction. All lumens aspirated and flushed well. The catheter was secured to the skin with suture. The insertion site was was covered with bio patch and sterile dressing. Left groin skin was prepped and draped in the usual sterile fashion. All elements of maximal sterile barrier were utilized including, cap, mask, sterile gown, sterile gloves, large sterile drape, hand scrubbing and 2% Chlorhexidine for skin cleaning. The left common femoral artery was evaluated with ultrasound and shown to be patent. A permanent ultrasound image was obtained and  placed in the patient's medical record. Using sterile gel and a sterile probe cover, the left common femoral artery was entered with a 21 ga needle  during real time ultrasound guidance. 21 gauge needle removed over 0.018 inch guidewire and replaced with a transitional dilator set. Five [place] dilator exchanged for 5 French sheath over 0.035 inch guidewire. The S1 catheter advanced to the superior mesenteric artery and angiogram was performed. No active extravasation was identified. Utilizing Progreat microcatheter, proximal jejunal branch was selected and angiogram was performed. No flow to the colon was identified. No active extravasation was seen. The inferior mesenteric artery was selected with a Mickelson catheter. Angiogram showed active extravasation in the descending colon. Progreat microcatheter was advanced into the left colic branch and selective angiogram again showed contribution from small branches beyond the marginal artery of [place]. The more superior branch was successfully selected and embolized with a single 2 mm x 2 cm soft interlock coil. Post angiogram demonstrated no significant flow in the embolized artery and no extravasation. Numerous efforts were made to access the more inferior branch extending to the region of extravasation, however none were successful despite utilizing several different wires and micro catheters. Given that no additional extravasation was identified, decision was made to terminate the procedure without embolizing this more inferior branch. Groin sheath angiogram demonstrated appropriate access of the common femoral artery at the level of the femoral head. The groin was re-prepped and draped and closed with a 6 [place] Angio-Seal device. 2 minutes of additional compression applied to achieve hemostasis. IMPRESSION: Inferior mesenteric arteriogram demonstrates active hemorrhage in the descending colon. Two distal branches beyond the marginal artery of [place] appeared  to be supplying the area extravasation. The superior of these 2 branches was selected and embolized with a micro coil. Despite prolonged efforts, the more inferior branch could not be selected for embolization. No active extravasation identified at the end of the procedure. Electronically Signed   By: Miachel Roux M.D.   On: 10/12/2020 20:45   DG Chest Port 1 View  Result Date: 10/13/2020 CLINICAL DATA:  Vomiting EXAM: PORTABLE CHEST 1 VIEW COMPARISON:  Yesterday FINDINGS: Cardiopericardial enlargement. There is biventricular pacer leads and mitral valve replacement. New right IJ line with tip at the upper right atrium. There is no edema, consolidation, effusion, or pneumothorax. IMPRESSION: No evidence of active disease. Electronically Signed   By: Monte Fantasia M.D.   On: 10/13/2020 06:40   DG Chest Portable 1 View  Result Date: 10/12/2020 CLINICAL DATA:  Chest pain EXAM: PORTABLE CHEST 1 VIEW COMPARISON:  09/29/2019 FINDINGS: Marked chronic cardiomegaly. Biventricular pacer and mitral valve replacement. There is no edema, consolidation, effusion, or pneumothorax. IMPRESSION: Stable from 2021.  Cardiomegaly without failure. Electronically Signed   By: Monte Fantasia M.D.   On: 10/12/2020 06:20   IR EMBO ART  VEN HEMORR LYMPH EXTRAV  INC GUIDE ROADMAPPING  Result Date: 10/12/2020 INDICATION: 75 year old woman with acute GI bleed in hypertension requiring pressors presents to interventional radiology for angiogram and possible embolization. CT angiography of the abdomen and pelvis showed acute hemorrhage in the descending colon in a background of diverticulosis. Patient's INR was elevated at 4.9 on presentation. Patient is on Coumadin for mechanical mitral valve. INR improved to 1.9 after administration of 2 units of FFP. Given patient's tenuous condition decision was made to perform angiogram and possible embolization. Central venous catheter was placed in anticipation of blood draws, blood product  administration, and fluid resuscitation. EXAM: 1. Ultrasound-guided access of left common femoral artery. 2. Superior mesenteric angiogram and selective angiogram of proximal jejunal branches 3. Inferior mesenteric angiogram and selective angiogram and embolization of left  colic branches 4. Angio-Seal closure of left common femoral artery access 5. Dual lumen central venous catheter placement MEDICATIONS: None ANESTHESIA/SEDATION: Moderate (conscious) sedation was employed during this procedure. A total of Versed 1 mg and Fentanyl 25 mcg was administered intravenously. Moderate Sedation Time: 2 hours and 9 minutes. The patient's level of consciousness and vital signs were monitored continuously by radiology nursing throughout the procedure under my direct supervision. FLUOROSCOPY TIME:  Fluoroscopy Time: 37 minutes 36 seconds (325 mGy). COMPLICATIONS: None immediate. PROCEDURE: Informed consent was obtained from the patient following explanation of the procedure, risks, benefits and alternatives. The patient understands, agrees and consents for the procedure. All questions were addressed. A time out was performed prior to the initiation of the procedure. Maximal barrier sterile technique utilized including caps, mask, sterile gowns, sterile gloves, large sterile drape, hand hygiene, and chlorhexidine prep. Patient positioned supine on the angiography table. Right neck prepped and draped in the usual sterile fashion. All elements of maximal sterile barrier were utilized including, cap, mask, sterile gown, sterile gloves, large sterile drape, hand scrubbing and 2% Chlorhexidine for skin cleaning. The right internal jugular vein was evaluated with ultrasound and shown to be patent. A permanent ultrasound image was obtained and placed in the patient's medical record. Using sterile gel and a sterile probe cover, the right internal jugular vein was entered with a 21 ga needle during real time ultrasound guidance. 0.018  inch guidewire placed and 21 ga needle exchanged for peel-away sheath Dual lumen central venous catheter was inserted over guidewire. The tip was positioned at the cavoatrial junction. All lumens aspirated and flushed well. The catheter was secured to the skin with suture. The insertion site was was covered with bio patch and sterile dressing. Left groin skin was prepped and draped in the usual sterile fashion. All elements of maximal sterile barrier were utilized including, cap, mask, sterile gown, sterile gloves, large sterile drape, hand scrubbing and 2% Chlorhexidine for skin cleaning. The left common femoral artery was evaluated with ultrasound and shown to be patent. A permanent ultrasound image was obtained and placed in the patient's medical record. Using sterile gel and a sterile probe cover, the left common femoral artery was entered with a 21 ga needle during real time ultrasound guidance. 21 gauge needle removed over 0.018 inch guidewire and replaced with a transitional dilator set. Five [place] dilator exchanged for 5 French sheath over 0.035 inch guidewire. The S1 catheter advanced to the superior mesenteric artery and angiogram was performed. No active extravasation was identified. Utilizing Progreat microcatheter, proximal jejunal branch was selected and angiogram was performed. No flow to the colon was identified. No active extravasation was seen. The inferior mesenteric artery was selected with a Mickelson catheter. Angiogram showed active extravasation in the descending colon. Progreat microcatheter was advanced into the left colic branch and selective angiogram again showed contribution from small branches beyond the marginal artery of [place]. The more superior branch was successfully selected and embolized with a single 2 mm x 2 cm soft interlock coil. Post angiogram demonstrated no significant flow in the embolized artery and no extravasation. Numerous efforts were made to access the more  inferior branch extending to the region of extravasation, however none were successful despite utilizing several different wires and micro catheters. Given that no additional extravasation was identified, decision was made to terminate the procedure without embolizing this more inferior branch. Groin sheath angiogram demonstrated appropriate access of the common femoral artery at the level of the femoral head.  The groin was re-prepped and draped and closed with a 6 [place] Angio-Seal device. 2 minutes of additional compression applied to achieve hemostasis. IMPRESSION: Inferior mesenteric arteriogram demonstrates active hemorrhage in the descending colon. Two distal branches beyond the marginal artery of [place] appeared to be supplying the area extravasation. The superior of these 2 branches was selected and embolized with a micro coil. Despite prolonged efforts, the more inferior branch could not be selected for embolization. No active extravasation identified at the end of the procedure. Electronically Signed   By: Miachel Roux M.D.   On: 10/12/2020 20:45   ECHOCARDIOGRAM LIMITED  Result Date: 10/12/2020    ECHOCARDIOGRAM LIMITED REPORT   Patient Name:   RILEIGH KAWASHIMA Date of Exam: 10/12/2020 Medical Rec #:  [id number]    Height:       66.0 in Accession #:    [id number]   Weight:       134.0 lb Date of Birth:  1945/12/30    BSA:          1.687 m Patient Age:    84 years     BP:           80/58 mmHg Patient Gender: F            HR:           70 bpm. Exam Location:  Inpatient Procedure: Limited Echo, Color Doppler and Cardiac Doppler Indications:    R07.9* Chest pain, unspecified  History:        Patient has prior history of Echocardiogram examinations, most                 recent 10/27/2018. Defibrillator; Arrythmias:Atrial Fibrillation.                 Mechanical Mitral Valve Replacement.                  Mitral Valve: mechanical valve valve is present in the mitral                 position.  Sonographer:    Raquel Sarna  Senior RDCS Referring Phys: [id number] Donita Brooks  Sonographer Comments: Limited to recheck EF per notes. IMPRESSIONS  1. Left ventricular ejection fraction, by estimation, is <20%. The left ventricle has severely decreased function. The left ventricle demonstrates global hypokinesis. The left ventricular internal cavity size was severely dilated.  2. Left atrial size was severely dilated.  3. Right atrial size was severely dilated.  4. The mitral valve has been repaired/replaced. There is a mechanical valve present in the mitral position.  5. The aortic valve is tricuspid. Aortic valve regurgitation is trivial.  6. The inferior vena cava is normal in size with greater than 50% respiratory variability, suggesting right atrial pressure of 3 mmHg. Comparison(s): Prior images reviewed side by side. Conclusion(s)/Recommendation(s): Limited echo for EF, which remains severely reduced at <20%. Prior images reviewed and are similar. FINDINGS  Left Ventricle: Left ventricular ejection fraction, by estimation, is <20%. The left ventricle has severely decreased function. The left ventricle demonstrates global hypokinesis. The left ventricular internal cavity size was severely dilated. Left Atrium: Left atrial size was severely dilated. Right Atrium: Right atrial size was severely dilated. Mitral Valve: The mitral valve has been repaired/replaced. There is a mechanical valve present in the mitral position. Tricuspid Valve: The tricuspid valve is grossly normal. Tricuspid valve regurgitation is trivial. Aortic Valve: The aortic valve is tricuspid. Aortic valve regurgitation is trivial. Venous: The inferior  vena cava is normal in size with greater than 50% respiratory variability, suggesting right atrial pressure of 3 mmHg. Additional Comments: A pacer wire is visualized in the right atrium and right ventricle. RIGHT VENTRICLE RV S prime:     5.33 cm/s TAPSE (M-mode): 1.3 cm Buford Dresser MD Electronically signed by  Buford Dresser MD Signature Date/Time: 10/12/2020/9:10:35 PM    Final    CT Angio Abd/Pel w/ and/or w/o  Result Date: 10/12/2020 CLINICAL DATA:  75 year old female with a history of melena EXAM: CTA ABDOMEN AND PELVIS WITHOUT AND WITH CONTRAST TECHNIQUE: Multidetector CT imaging of the abdomen and pelvis was performed using the standard protocol during bolus administration of intravenous contrast. Multiplanar reconstructed images and MIPs were obtained and reviewed to evaluate the vascular anatomy. CONTRAST:  76mL OMNIPAQUE IOHEXOL 350 MG/ML SOLN COMPARISON:  11/26/2018 FINDINGS: VASCULAR Aorta: Mild atherosclerotic changes of the abdominal aorta. No aneurysm or dissection. No periaortic fluid. Celiac: Celiac artery patent. Minimal atherosclerosis. The configuration and diameter of the celiac artery proximally suggests compression from the overlying diaphragmatic cruise. SMA: SMA patent with no significant atherosclerotic changes. Renals: - Right: Single right renal artery. No significant calcified plaque at the origin. Small caliber right renal artery. - Left: Single left renal artery. No significant atherosclerotic changes. IMA: IMA is patent. Right lower extremity: Unremarkable course, caliber, and contour of the right iliac system. No aneurysm, dissection, or occlusion. Minimal atherosclerosis of the right iliac system. Hypogastric artery is patent. Common femoral artery patent. Postsurgical changes adjacent to the right common femoral artery without focal fluid or evidence pseudoaneurysm. Proximal SFA and profunda femoris patent. Left lower extremity: Unremarkable course, caliber, and contour of the left iliac system. No aneurysm, dissection, or occlusion. Minimal atherosclerosis of the left iliac system. Hypogastric artery is patent. Common femoral artery patent. Proximal SFA and profunda femoris patent. Veins: Unremarkable appearance of the venous system. Review of the MIP images confirms the above  findings. NON-VASCULAR Lower chest: Cardiomegaly, incompletely imaged. Cardiac pacing leads are present Hepatobiliary: Unremarkable appearance of the liver. Noncalcified cholelithiasis is evident. No focal inflammatory changes. Pancreas: Unremarkable. Spleen: Unremarkable. Adrenals/Urinary Tract: - Right adrenal gland: Unremarkable - Left adrenal gland: Unremarkable. - Right kidney: Enlargement of the right renal pelvis and collecting system, similar to the comparison CT. Nonobstructing right-sided nephrolithiasis. There appears to be ureteropelvic junction narrowing given the transition. Right renal cortical thinning. - Left Kidney: No left-sided hydronephrosis or nephrolithiasis. No inflammatory changes. Unremarkable course of the left ureter. Small low-density and nonenhancing lesions, most likely cyst though too small to characterize. - Urinary Bladder: Distension of the urinary bladder. Stomach/Bowel: - Stomach: Unremarkable. - Small bowel: Unremarkable - Appendix: Surgical changes in the cecum. Appendix is not visualized, however, no inflammatory changes are present adjacent to the cecum to indicate an appendicitis. - Colon: Surgical changes at the cecum. Diverticula present within transverse colon, descending colon, sigmoid colon. The splenic flexure descending colon, and the rectum are fluid-filled. There is evidence extravasation of contrast involving the descending colon given the appearance of the density on the arterial phase and intensification on the delayed phase imaging. Lymphatic: No lymphadenopathy Mesenteric: No free fluid or air. No mesenteric adenopathy. Reproductive: Hysterectomy Other: No hernia. Musculoskeletal: No acute displaced fracture. Degenerative changes of the spine. Degenerative changes of the hips. No aggressive lytic lesion or sclerotic lesion identified. IMPRESSION: The CT angiogram is positive for GI bleeding involving the descending colon, presumably diverticular disease. These  above results will be called to the ordering clinician  or representative by the Radiologist Assistant, and communication documented in the PACS or [employer]. Aortic Atherosclerosis (ICD10-I70.0). 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 Associated nonobstructing nephrolithiasis. Additional ancillary findings as above. Signed, Dulcy Fanny. Dellia Nims, RPVI [facility] Electronically Signed   By: Corrie Mckusick D.O.   On: 10/12/2020 11:36      Subjective: Patient seen and examined at bedside, resting comfortably.  No specific complaints this morning.  Ready for discharge home later today.  Patient's son coming in today to give Lovenox injection.  Discussed with patient needs close follow-up with her PCP for further INR checks as she remains subtherapeutic.  No other questions or concerns at this time.  Denies headache, no fever/chills/night sweats, no nausea/vomiting/diarrhea, no chest pain, palpitations, no shortness of breath, no abdominal pain, no weakness, no fatigue, no paresthesias.  No acute events overnight per nursing staff.  Discharge Exam: Vitals:   10/26/20 0415 10/26/20 1024  BP: 96/73   Pulse: 75 72  Resp: 15   Temp: 98.9 F (37.2 C)   SpO2: 100%    Vitals:   10/25/20 2340 10/26/20 0031 10/26/20 0415 10/26/20 1024  BP: (!) 104/56  96/73   Pulse: 73  75 72  Resp: (!) 22  15   Temp: 99.2 F (37.3 C)  98.9 F (37.2 C)   TempSrc: Oral  Oral   SpO2: 100%  100%   Weight:  57.8 kg    Height:        General: Pt is alert, awake, not in acute distress, thin in appearance Cardiovascular: RRR, S1/S2 +, no rubs, no gallops Respiratory: CTA bilaterally, no wheezing, no rhonchi Abdominal: Soft, NT, ND, bowel sounds + Extremities: no edema, no cyanosis    The results of significant diagnostics from this hospitalization  (including imaging, microbiology, ancillary and laboratory) are listed below for reference.     Microbiology: Recent Results (from the past 240 hour(s))  Culture, Urine     Status: Abnormal   Collection Time: 10/19/20  5:57 AM   Specimen: Urine, Random  Result Value Ref Range Status   Specimen Description URINE, RANDOM  Final   Special Requests   Final    NONE Performed at [facility], [address], [place], [place] [address]    Culture >=100,000 COLONIES/mL KLEBSIELLA PNEUMONIAE (A)  Final   Report Status 10/21/2020 FINAL  Final   Organism ID, Bacteria KLEBSIELLA PNEUMONIAE (A)  Final      Susceptibility   Klebsiella pneumoniae - MIC*    AMPICILLIN >=32 RESISTANT Resistant     CEFAZOLIN <=4 SENSITIVE Sensitive     CEFEPIME <=0.12 SENSITIVE Sensitive     CEFTRIAXONE <=0.25 SENSITIVE Sensitive     CIPROFLOXACIN <=0.25 SENSITIVE Sensitive     GENTAMICIN <=1 SENSITIVE Sensitive     IMIPENEM <=0.25 SENSITIVE Sensitive     NITROFURANTOIN 64 INTERMEDIATE Intermediate     TRIMETH/SULFA <=20 SENSITIVE Sensitive     AMPICILLIN/SULBACTAM 4 SENSITIVE Sensitive     PIP/TAZO <=4 SENSITIVE Sensitive     * >=100,000 COLONIES/mL KLEBSIELLA PNEUMONIAE     Labs: BNP (last 3 results) No results for input(s): BNP in the last 8760 hours. Basic Metabolic Panel: Recent Labs  Lab 10/21/20 0311 10/22/20 0351 10/23/20 0236 10/26/20 0352  NA 138 137 139 137  K 3.9 3.6 3.9 4.1  CL 106 101 105 103  CO2 24 25  26 25  GLUCOSE 92 84 84 91  BUN 10 10 10 14   CREATININE 0.92 0.82 0.95 0.94  CALCIUM 8.6* 8.6* 8.7* 9.2   Liver Function Tests: No results for input(s): AST, ALT, ALKPHOS, BILITOT, PROT, ALBUMIN in the last 168 hours. No results for input(s): LIPASE, AMYLASE in the last 168 hours. No results for input(s): AMMONIA in the last 168 hours. CBC: Recent Labs  Lab 10/22/20 0351 10/23/20 0236 10/24/20 0323 10/25/20 0340 10/26/20 0352  WBC 5.9 6.7 6.8 5.7 7.1  HGB 9.0* 8.9*  9.6* 9.3* 9.9*  HCT 29.1* 29.2* 29.5* 28.8* 31.9*  MCV 96.0 97.0 95.2 95.0 96.4  PLT 269 303 316 349 377   Cardiac Enzymes: No results for input(s): CKTOTAL, CKMB, CKMBINDEX, TROPONINI in the last 168 hours. BNP: Invalid input(s): POCBNP CBG: No results for input(s): GLUCAP in the last 168 hours. D-Dimer No results for input(s): DDIMER in the last 72 hours. Hgb A1c No results for input(s): HGBA1C in the last 72 hours. Lipid Profile No results for input(s): CHOL, HDL, LDLCALC, TRIG, CHOLHDL, LDLDIRECT in the last 72 hours. Thyroid function studies No results for input(s): TSH, T4TOTAL, T3FREE, THYROIDAB in the last 72 hours.  Invalid input(s): FREET3 Anemia work up No results for input(s): VITAMINB12, FOLATE, FERRITIN, TIBC, IRON, RETICCTPCT in the last 72 hours. Urinalysis    Component Value Date/Time   COLORURINE YELLOW 10/18/2020 1457   APPEARANCEUR CLOUDY (A) 10/18/2020 1457   LABSPEC 1.010 10/18/2020 1457   PHURINE 5.0 10/18/2020 1457   GLUCOSEU NEGATIVE 10/18/2020 1457   HGBUR SMALL (A) 10/18/2020 1457   BILIRUBINUR NEGATIVE 10/18/2020 1457   KETONESUR NEGATIVE 10/18/2020 1457   PROTEINUR NEGATIVE 10/18/2020 1457   UROBILINOGEN 0.2 10/23/2010 2318   NITRITE NEGATIVE 10/18/2020 1457   LEUKOCYTESUR LARGE (A) 10/18/2020 1457   Sepsis Labs Invalid input(s): PROCALCITONIN,  WBC,  LACTICIDVEN Microbiology Recent Results (from the past 240 hour(s))  Culture, Urine     Status: Abnormal   Collection Time: 10/19/20  5:57 AM   Specimen: Urine, Random  Result Value Ref Range Status   Specimen Description URINE, RANDOM  Final   Special Requests   Final    NONE Performed at [facility], [place] [address], [place], [place] [address]    Culture >=100,000 COLONIES/mL KLEBSIELLA PNEUMONIAE (A)  Final   Report Status 10/21/2020 FINAL  Final   Organism ID, Bacteria KLEBSIELLA PNEUMONIAE (A)  Final      Susceptibility   Klebsiella pneumoniae - MIC*    AMPICILLIN >=32  RESISTANT Resistant     CEFAZOLIN <=4 SENSITIVE Sensitive     CEFEPIME <=0.12 SENSITIVE Sensitive     CEFTRIAXONE <=0.25 SENSITIVE Sensitive     CIPROFLOXACIN <=0.25 SENSITIVE Sensitive     GENTAMICIN <=1 SENSITIVE Sensitive     IMIPENEM <=0.25 SENSITIVE Sensitive     NITROFURANTOIN 64 INTERMEDIATE Intermediate     TRIMETH/SULFA <=20 SENSITIVE Sensitive     AMPICILLIN/SULBACTAM 4 SENSITIVE Sensitive     PIP/TAZO <=4 SENSITIVE Sensitive     * >=100,000 COLONIES/mL KLEBSIELLA PNEUMONIAE     Time coordinating discharge: Over 30 minutes  SIGNED:    J [place] DO  [facility] 10/26/2020, 11:09 AM

## 2020-10-26 NOTE — Discharge Instructions (Signed)
Indwelling Urinary Catheter Care, Adult °An indwelling urinary catheter is a thin tube that is put into your bladder. The tube helps to drain pee (urine) out of your body. The tube goes in through your urethra. Your urethra is where pee comes out of your body. Your pee will come out through the catheter, then it will go into a bag (drainage bag). °Take good care of your catheter so it will work well. °How to wear your catheter and bag °Supplies needed °· Sticky tape (adhesive tape) or a leg strap. °· Alcohol wipe or soap and water (if you use tape). °· A clean towel (if you use tape). °· Large overnight bag. °· Smaller bag (leg bag). °Wearing your catheter °Attach your catheter to your leg with tape or a leg strap. °· Make sure the catheter is not pulled tight. °· If a leg strap gets wet, take it off and put on a dry strap. °· If you use tape to hold the bag on your leg: °1. Use an alcohol wipe or soap and water to wash your skin where the tape made it sticky before. °2. Use a clean towel to pat-dry that skin. °3. Use new tape to make the bag stay on your leg. °Wearing your bags °You should have been given a large overnight bag. °· You may wear the overnight bag in the day or night. °· Always have the overnight bag lower than your bladder.  Do not let the bag touch the floor. °· Before you go to sleep, put a clean plastic bag in a wastebasket. Then hang the overnight bag inside the wastebasket. °You should also have a smaller leg bag that fits under your clothes. °· Always wear the leg bag below your knee. °· Do not wear your leg bag at night. °How to care for your skin and catheter °Supplies needed °· A clean washcloth. °· Water and mild soap. °· A clean towel. °Caring for your skin and catheter °· Clean the skin around your catheter every day: °1. Wash your hands with soap and water. °2. Wet a clean washcloth in warm water and mild soap. °3. Clean the skin around your urethra. °§ If you are female: °§ Gently  spread the folds of skin around your vagina (labia). °§ With the washcloth in your other hand, wipe the inner side of your labia on each side. Wipe from front to back. °§ If you are female: °§ Pull back any skin that covers the end of your penis (foreskin). °§ With the washcloth in your other hand, wipe your penis in small circles. Start wiping at the tip of your penis, then move away from the catheter. °§ Move the foreskin back in place, if needed. °4. With your free hand, hold the catheter close to where it goes into your body. °§ Keep holding the catheter during cleaning so it does not get pulled out. °5. With the washcloth in your other hand, clean the catheter. °§ Only wipe downward on the catheter. °§ Do not wipe upward toward your body. Doing this may push germs into your urethra and cause infection. °6. Use a clean towel to pat-dry the catheter and the skin around it. Make sure to wipe off all soap. °7. Wash your hands with soap and water. °· Shower every day. Do not take baths. °· Do not use cream, ointment, or lotion on the area where the catheter goes into your body, unless your doctor tells you to. °· Do not   use powders, sprays, or lotions on your genital area. °· Check your skin around the catheter every day for signs of infection. Check for: °? Redness, swelling, or pain. °? Fluid or blood. °? Warmth. °? Pus or a bad smell.  °  °  °How to empty the bag °Supplies needed °· Rubbing alcohol. °· Gauze pad or cotton ball. °· Tape or a leg strap. °Emptying the bag °Pour the pee out of your bag when it is ?-½ full, or at least 2-3 times a day. Do this for your overnight bag and your leg bag. °1. Wash your hands with soap and water. °2. Separate (detach) the bag from your leg. °3. Hold the bag over the toilet or a clean pail. Keep the bag lower than your hips and bladder. This is so the pee (urine) does not go back into the tube. °4. Open the pour spout. It is at the bottom of the bag. °5. Empty the pee into the  toilet or pail. Do not let the pour spout touch any surface. °6. Put rubbing alcohol on a gauze pad or cotton ball. °7. Use the gauze pad or cotton ball to clean the pour spout. °8. Close the pour spout. °9. Attach the bag to your leg with tape or a leg strap. °10. Wash your hands with soap and water. °Follow instructions for cleaning the drainage bag: °· From the product maker. °· As told by your doctor. °How to change the bag °Supplies needed °· Alcohol wipes. °· A clean bag. °· Tape or a leg strap. °Changing the bag °Replace your bag when it starts to leak, smell bad, or look dirty. °1. Wash your hands with soap and water. °2. Separate the dirty bag from your leg. °3. Pinch the catheter with your fingers so that pee does not spill out. °4. Separate the catheter tube from the bag tube where these tubes connect (at the connection valve). Do not let the tubes touch any surface. °5. Clean the end of the catheter tube with an alcohol wipe. Use a different alcohol wipe to clean the end of the bag tube. °6. Connect the catheter tube to the tube of the clean bag. °7. Attach the clean bag to your leg with tape or a leg strap. Do not make the bag tight on your leg. °8. Wash your hands with soap and water. °General rules °· Never pull on your catheter. Never try to take it out. Doing that can hurt you. °· Always wash your hands before and after you touch your catheter or bag. Use a mild, fragrance-free soap. If you do not have soap and water, use hand sanitizer. °· Always make sure there are no twists or bends (kinks) in the catheter tube. °· Always make sure there are no leaks in the catheter or bag. °· Drink enough fluid to keep your pee pale yellow. °· Do not take baths, swim, or use a hot tub. °· If you are female, wipe from front to back after you poop (have a bowel movement).   °Contact a doctor if: °· Your pee is cloudy. °· Your pee smells worse than usual. °· Your catheter gets clogged. °· Your catheter  leaks. °· Your bladder feels full. °Get help right away if: °· You have redness, swelling, or pain where the catheter goes into your body. °· You have fluid, blood, pus, or a bad smell coming from the area where the catheter goes into your body. °· Your skin feels   warm where the catheter goes into your body.  You have a fever.  You have pain in your: ? Belly (abdomen). ? Legs. ? Lower back. ? Bladder.  You see blood in the catheter.  Your pee is pink or red.  You feel sick to your stomach (nauseous).  You throw up (vomit).  You have chills.  Your pee is not draining into the bag.  Your catheter gets pulled out. Summary  An indwelling urinary catheter is a thin tube that is placed into the bladder to help drain pee (urine) out of the body.  The catheter is placed into the part of the body that drains pee from the bladder (urethra).  Taking good care of your catheter will keep it working properly and help prevent problems.  Always wash your hands before and after touching your catheter or bag.  Never pull on your catheter or try to take it out. This information is not intended to replace advice given to you by your health care provider. Make sure you discuss any questions you have with your health care provider. Document Revised: 12/04/2018 Document Reviewed: 03/28/2017 Elsevier Patient Education  2021 [place] on my medicine - Coumadin   (Warfarin)  This medication education was reviewed with me or my healthcare representative as part of my discharge preparation.  The pharmacist that spoke with me during my hospital stay was:  Onnie Boer, RPH-CPP  Why was Coumadin prescribed for you? Coumadin was prescribed for you because you have a blood clot or a medical condition that can cause an increased risk of forming blood clots. Blood clots can cause serious health problems by blocking the flow of blood to the heart, lung, or brain. Coumadin can prevent harmful  blood clots from forming. As a reminder your indication for Coumadin is:   Blood Clot Prevention After Heart Valve Surgery  What test will check on my response to Coumadin? While on Coumadin (warfarin) you will need to have an INR test regularly to ensure that your dose is keeping you in the desired range. The INR (international normalized ratio) number is calculated from the result of the laboratory test called prothrombin time (PT).  If an INR APPOINTMENT HAS NOT ALREADY BEEN MADE FOR YOU please schedule an appointment to have this lab work done by your health care provider within 7 days. Your INR goal is usually a number between:  2 to 3 or your provider may give you a more narrow range like 2-2.5.  Ask your health care provider during an office visit what your goal INR is.  What  do you need to  know  About  COUMADIN? Take Coumadin (warfarin) exactly as prescribed by your healthcare provider about the same time each day.  DO NOT stop taking without talking to the doctor who prescribed the medication.  Stopping without other blood clot prevention medication to take the place of Coumadin may increase your risk of developing a new clot or stroke.  Get refills before you run out.  What do you do if you miss a dose? If you miss a dose, take it as soon as you remember on the same day then continue your regularly scheduled regimen the next day.  Do not take two doses of Coumadin at the same time.  Important Safety Information A possible side effect of Coumadin (Warfarin) is an increased risk of bleeding. You should call your healthcare provider right away if you experience any of the following: ? Bleeding  from an injury or your nose that does not stop. ? Unusual colored urine (red or dark brown) or unusual colored stools (red or black). ? Unusual bruising for unknown reasons. ? A serious fall or if you hit your head (even if there is no bleeding).  Some foods or medicines interact with Coumadin  (warfarin) and might alter your response to warfarin. To help avoid this: ? Eat a balanced diet, maintaining a consistent amount of Vitamin K. ? Notify your provider about major diet changes you plan to make. ? Avoid alcohol or limit your intake to 1 drink for women and 2 drinks for men per day. (1 drink is 5 oz. wine, 12 oz. beer, or 1.5 oz. liquor.)  Make sure that ANY health care provider who prescribes medication for you knows that you are taking Coumadin (warfarin).  Also make sure the healthcare provider who is monitoring your Coumadin knows when you have started a new medication including herbals and non-prescription products.  Coumadin (Warfarin)  Major Drug Interactions  Increased Warfarin Effect Decreased Warfarin Effect  Alcohol (large quantities) Antibiotics (esp. Septra/Bactrim, Flagyl, Cipro) Amiodarone (Cordarone) Aspirin (ASA) Cimetidine (Tagamet) Megestrol (Megace) NSAIDs (ibuprofen, naproxen, etc.) Piroxicam (Feldene) Propafenone (Rythmol SR) Propranolol (Inderal) Isoniazid (INH) Posaconazole (Noxafil) Barbiturates (Phenobarbital) Carbamazepine (Tegretol) Chlordiazepoxide (Librium) Cholestyramine (Questran) Griseofulvin Oral Contraceptives Rifampin Sucralfate (Carafate) Vitamin K   Coumadin (Warfarin) Major Herbal Interactions  Increased Warfarin Effect Decreased Warfarin Effect  Garlic Ginseng Ginkgo biloba Coenzyme Q10 Green tea St. Johns wort    Coumadin (Warfarin) FOOD Interactions  Eat a consistent number of servings per week of foods HIGH in Vitamin K (1 serving =  cup)  Collards (cooked, or boiled & drained) Kale (cooked, or boiled & drained) Mustard greens (cooked, or boiled & drained) Parsley *serving size only =  cup Spinach (cooked, or boiled & drained) Swiss chard (cooked, or boiled & drained) Turnip greens (cooked, or boiled & drained)  Eat a consistent number of servings per week of foods MEDIUM-HIGH in Vitamin K (1 serving = 1  cup)  Asparagus (cooked, or boiled & drained) Broccoli (cooked, boiled & drained, or raw & chopped) Brussel sprouts (cooked, or boiled & drained) *serving size only =  cup Lettuce, raw (green leaf, endive, romaine) Spinach, raw Turnip greens, raw & chopped   These websites have more information on Coumadin (warfarin):  FailFactory.se; VeganReport.com.au;

## 2020-10-26 NOTE — Care Management Important Message (Signed)
Important Message  Patient Details  Name: Finnleigh Marchetti MRN: [id number] Date of Birth: 01-15-1946   Medicare Important Message Given:  Yes     Shelda Altes 10/26/2020, 8:56 AM

## 2020-10-26 NOTE — TOC Transition Note (Signed)
Transition of Care [facility]) - CM/SW Discharge Note   Patient Details  Name: Angela [name] MRN: [id number] Date of Birth: 05-01-1946  [facility]) CM/SW Contact:  Zenon Mayo, RN Phone Number: 10/26/2020, 11:36 AM   Clinical Narrative:    Patient is for dc today, NCM notified Tommi Rumps with [facility].  Son states she already has a 3 n 1 so they will not need one.     Final next level of care: [place] Barriers to Discharge: No Barriers Identified   Patient Goals and CMS Choice Patient states their goals for this hospitalization and ongoing recovery are:: home with hh CMS Medicare.gov Compare Post Acute Care list provided to:: Patient Represenative (must comment) Choice offered to / list presented to : Adult Children  Discharge Placement                       Discharge Plan and Services                DME Arranged: 3-N-1 DME Agency: NA Date DME Agency Contacted: 10/23/20 Time DME Agency Contacted: [id number] Representative spoke with at DME Agency: [place]: RN,PT,OT,Nurse's Aide [place]: [place] Date [place]: 11/17/20 Time [place]: [place] Representative spoke with at [place]: [place] ([place]) Interventions     Readmission Risk Interventions Readmission Risk Prevention Plan 10/13/2019 01/19/2019 12/05/2018  Transportation Screening Complete Complete -  PCP or Specialist Appt within 3-5 Days - - -  HRI or [place] - Complete Complete  Social Work Consult for [place] Planning/Counseling - Complete Complete  Palliative Care Screening - Complete -  Medication Review Press photographer) Complete Complete -  [place] or Home Care Consult Complete - -  SW Recovery Care/Counseling Consult Complete - -  Palliative Care Screening Not Applicable - -  [place] Not Applicable - -  Some recent data might be hidden

## 2020-10-31 ENCOUNTER — Ambulatory Visit (INDEPENDENT_AMBULATORY_CARE_PROVIDER_SITE_OTHER): Payer: Medicare Other

## 2020-10-31 DIAGNOSIS — I472 Ventricular tachycardia, unspecified: Secondary | ICD-10-CM

## 2020-10-31 LAB — CUP PACEART REMOTE DEVICE CHECK
Battery Remaining Longevity: 23 mo
Battery Remaining Percentage: 27 %
Battery Voltage: 2.87 V
Date Time Interrogation Session: 20220308024446
HighPow Impedance: 44 Ohm
HighPow Impedance: 44 Ohm
Implantable Lead Implant Date: 20090618
Implantable Lead Implant Date: 20090618
Implantable Lead Location: 753858
Implantable Lead Location: 753860
Implantable Lead Model: 7120
Implantable Pulse Generator Implant Date: 20160111
Lead Channel Impedance Value: 290 Ohm
Lead Channel Impedance Value: 430 Ohm
Lead Channel Pacing Threshold Amplitude: 0.5 V
Lead Channel Pacing Threshold Amplitude: 0.5 V
Lead Channel Pacing Threshold Pulse Width: 0.5 ms
Lead Channel Pacing Threshold Pulse Width: 0.5 ms
Lead Channel Sensing Intrinsic Amplitude: 12 mV
Lead Channel Setting Pacing Amplitude: 2 V
Lead Channel Setting Pacing Amplitude: 2 V
Lead Channel Setting Pacing Pulse Width: 0.5 ms
Lead Channel Setting Pacing Pulse Width: 0.5 ms
Lead Channel Setting Sensing Sensitivity: 0.5 mV
Pulse Gen Serial Number: 7219979

## 2020-11-08 NOTE — Progress Notes (Signed)
Remote ICD transmission.   

## 2020-12-15 ENCOUNTER — Other Ambulatory Visit: Payer: Self-pay | Admitting: Physician Assistant

## 2020-12-15 NOTE — Telephone Encounter (Signed)
Should be refilled by cardiology

## 2021-01-09 ENCOUNTER — Ambulatory Visit: Payer: Medicare Other | Admitting: Internal Medicine

## 2021-01-11 ENCOUNTER — Other Ambulatory Visit: Payer: Self-pay

## 2021-01-11 ENCOUNTER — Encounter: Payer: Self-pay | Admitting: Internal Medicine

## 2021-01-11 ENCOUNTER — Ambulatory Visit: Payer: Medicare Other | Admitting: Internal Medicine

## 2021-01-11 VITALS — BP 104/70 | HR 71 | Ht 67.0 in | Wt 120.0 lb

## 2021-01-11 DIAGNOSIS — I480 Paroxysmal atrial fibrillation: Secondary | ICD-10-CM | POA: Diagnosis not present

## 2021-01-11 DIAGNOSIS — I5042 Chronic combined systolic (congestive) and diastolic (congestive) heart failure: Secondary | ICD-10-CM

## 2021-01-11 DIAGNOSIS — I472 Ventricular tachycardia, unspecified: Secondary | ICD-10-CM

## 2021-01-11 DIAGNOSIS — Z95810 Presence of automatic (implantable) cardiac defibrillator: Secondary | ICD-10-CM

## 2021-01-11 DIAGNOSIS — Z952 Presence of prosthetic heart valve: Secondary | ICD-10-CM

## 2021-01-11 NOTE — Patient Instructions (Signed)
Medication Instructions:  Your physician recommends that you continue on your current medications as directed. Please refer to the Current Medication list given to you today.  Labwork: None ordered.  Testing/Procedures: None ordered.  Follow-Up: Your physician wants you to follow-up in: one year with Cristopher Peru, MD or one of the following Advanced Practice Providers on your designated Care Team:    Chanetta Marshall, NP  Tommye Standard, PA-C  Legrand Como "Jonni Sanger" [place], [place]  Remote monitoring is used to monitor your ICD from home. This monitoring reduces the number of office visits required to check your device to one time per year. It allows [place] to keep an eye on the functioning of your device to ensure it is working properly. You are scheduled for a device check from home on 01/30/2021. You may send your transmission at any time that day. If you have a wireless device, the transmission will be sent automatically. After your physician reviews your transmission, you will receive a postcard with your next transmission date.  Any Other Special Instructions Will Be Listed Below (If Applicable).  If you need a refill on your cardiac medications before your next appointment, please call your pharmacy.

## 2021-01-11 NOTE — Progress Notes (Signed)
HPI Angela [name] returns today for followup. She has chronic atrial fib, chronic systolic heart failure, prior mitral valve replacement and VT/VF. She has had no recent ICD therapies. She lost her husband to Covid over a year ago. She denies chest pain or sob. Her son states that her appetive is down and she is fairly sedentary.   No Known Allergies   Current Outpatient Medications  Medication Sig Dispense Refill  . alendronate (FOSAMAX) 70 MG tablet Take 70 mg by mouth once a week.    . ALPRAZolam (XANAX) 0.25 MG tablet Take 0.25 mg by mouth at bedtime as needed.    [name] amiodarone (PACERONE) 200 MG tablet Take 1 tablet (200 mg total) by mouth daily. 30 tablet 3  . bethanechol (URECHOLINE) 10 MG tablet TAKE 1/2 TABLET (5 MG TOTAL) BY MOUTH THREE TIMES DAILY. 45 tablet 0  . carvedilol (COREG) 3.125 MG tablet Take 1 tablet (3.125 mg total) by mouth 2 (two) times daily with a meal. 180 tablet 3  . digoxin (LANOXIN) 0.125 MG tablet Take 125 mcg by mouth daily.    [name] enoxaparin (LOVENOX) 60 MG/0.6ML injection INJECT 1 SYRINGE (60MG  TOTAL) INTO THE SKIN TWO TIMES DAILY FOR 5 DAYS. 6 mL 0  . furosemide (LASIX) 20 MG tablet Take 20 mg by mouth daily.    . midodrine (PROAMATINE) 10 MG tablet Take 1 tablet (10 mg total) by mouth 3 (three) times daily with meals. 270 tablet 0  . midodrine (PROAMATINE) 10 MG tablet TAKE 1 TABLET (10 MG TOTAL) BY MOUTH THREE TIMES DAILY WITH MEALS. 270 tablet 0  . oxyCODONE (OXY IR/ROXICODONE) 5 MG immediate release tablet Take 1 tablet (5 mg total) by mouth every 6 (six) hours as needed for moderate pain. 20 tablet 0  . pantoprazole (PROTONIX) 40 MG tablet Take 1 tablet (40 mg total) by mouth daily. 90 tablet 0  . pantoprazole (PROTONIX) 40 MG tablet TAKE 1 TABLET (40 MG TOTAL) BY MOUTH DAILY. 90 tablet 0  . tiZANidine (ZANAFLEX) 4 MG tablet Take 4 mg by mouth daily as needed for muscle spasms.     [name] warfarin (COUMADIN) 5 MG tablet Take 1 tablet (5 mg total) by mouth  daily. 5 mg on Tu,W,F,Sa,Su    . warfarin (COUMADIN) 6 MG tablet Take 1 tablet (6 mg total) by mouth daily. 6 mg Mon Thu    . warfarin (COUMADIN) 7.5 MG tablet TAKE 1 TABLET (7.5 MG TOTAL) BY MOUTH DAILY FOR 3 DAYS. 3 tablet 0  . enoxaparin (LOVENOX) 60 MG/0.6ML injection Inject 0.6 mLs (60 mg total) into the skin 2 (two) times daily for 5 days. 6 mL 0  . warfarin (COUMADIN) 7.5 MG tablet Take 1 tablet (7.5 mg total) by mouth daily for 3 days. 3 tablet 0   No current facility-administered medications for this visit.     Past Medical History:  Diagnosis Date  . Abnormality of gait 08/23/2014  . Arthritis   . Atrial fibrillation ([place])    A.  Chronic Coumadin  . Cardiac arrest - ventricular fibrillation    A.  12/2007;  B. 01/2008 St. Jude Promote Bi-V ICD placed  . CVA (cerebral vascular accident) ([place])   . Diverticular disease   . DVT (deep venous thrombosis) ([place])   . 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 History of colon cancer    A.  1999 -  T3, N1  chemotherapy  . Internal hemorrhoids   . Lower GI bleed 10/17/2020  . Memory deficit 08/23/2014  . Nonischemic cardiomyopathy ([place])    a.  11/2001 - Cath - NL Cors; b. ECHO 09/22/11: EF 20%, MVR normal, moderate LAE, mild RAE c. ECHO (08/2012): ED 20%, diff HK, LA mod dilated, mild/mod TR, RV mild/mod decreased sys fx  . Rectal bleeding 04/28/2017  . Rheumatic heart disease    A. 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 Sick sinus syndrome ([place])    A.  s/p pacer in 1995.  B.    . Systolic CHF, chronic ([place])    A.  01/2008 Echo - EF 10-20%  . UTI (urinary tract infection) 01/19/2019    ROS:   All systems reviewed and negative except as noted in the HPI.   Past Surgical History:  Procedure Laterality Date  . ABDOMINAL HYSTERECTOMY    . AV NODE ABLATION N/A 09/23/2011   Procedure: AV NODE ABLATION;  Surgeon: Evans Lance, MD;  Location[facility] LAB;  Service: Cardiovascular;   Laterality: N/A;  . BIV ICD GENERTAOR CHANGE OUT N/A 09/05/2014   Procedure: BIV ICD GENERTAOR CHANGE OUT;  Surgeon: Evans Lance, MD;  Location: [facility] CATH LAB;  Service: Cardiovascular;  Laterality: N/A;  . Biventricular AICD    . COLON SURGERY  1999  . EMBOLECTOMY Left 10/01/2019   Procedure: Left Popliteal Artery Thrombectomy with Vein Patch Angioplasty; Left Tibial Artery Thrombectomy;  Surgeon: Elam Dutch, MD;  Location: [facility] OR;  Service: Vascular;  Laterality: Left;  . History of echocardiogram  2003, 2007, 2009  . I & D EXTREMITY Left 10/03/2019   Procedure: EVACUATION OF HEMATOMA  LEFT LEG;  Surgeon: Elam Dutch, MD;  Location: [place];  Service: Vascular;  Laterality: Left;  . INCISION AND DRAINAGE PERIRECTAL ABSCESS Right 11/28/2018   Procedure: IRRIGATION AND DEBRIDEMENT PERIRECTAL ABSCESS;  Surgeon: Greer Pickerel, MD;  Location: [place];  Service: General;  Laterality: Right;  . IR ANGIOGRAM VISCERAL SELECTIVE  10/12/2020  . IR ANGIOGRAM VISCERAL SELECTIVE  10/12/2020  . IR EMBO ART  VEN HEMORR LYMPH EXTRAV  INC GUIDE ROADMAPPING  10/12/2020  . IR FLUORO GUIDE CV LINE RIGHT  10/12/2020  . IR US GUIDE VASC ACCESS LEFT  10/12/2020  . IR US GUIDE VASC ACCESS RIGHT  10/12/2020  . Mitral Valve Replacement, Bjork-Shiley valve  1983     Family History  Problem Relation Age of Onset  . Diabetes Mother 10  . Coronary artery disease Mother        s/p cabg  . Lung cancer Father 55       smoker  . Coronary artery disease Sister        s/p cabg     Social History   Socioeconomic History  . Marital status: Married    Spouse name: Not on file  . Number of children: 1  . Years of education: Not on file  . Highest education level: Not on file  Occupational History  . Occupation: Retired    Fish farm manager: RETIRED  Tobacco Use  . Smoking status: Never Smoker  . Smokeless tobacco: Never Used  Vaping Use  . Vaping Use: Never used  Substance and Sexual Activity  . Alcohol use: No  .  Drug use: No  . Sexual activity: Not Currently  Other Topics Concern  . Not on file  Social History Narrative   The patient is married and has 1  child.  Lives in [place] with husband.  She is retired from working at [employer].  She does not smoke or drink   Social Determinants of Health   Financial Resource Strain: Not on file  Food Insecurity: Not on file  Transportation Needs: Not on file  Physical Activity: Not on file  Stress: Not on file  Social Connections: Not on file  Intimate Partner Violence: Not on file     BP 104/70   Pulse 71   Ht 5\' 7"  (1.702 m)   Wt 120 lb (54.4 kg)   SpO2 99%   BMI 18.79 kg/m   Physical Exam:  Well appearing NAD HEENT: Unremarkable Neck:  No JVD, no thyromegally Lymphatics:  No adenopathy Back:  No CVA tenderness Lungs:  Clear with no wheezes HEART:  Regular rate rhythm, no murmurs, no rubs, no clicks Abd:  soft, positive bowel sounds, no organomegally, no rebound, no guarding Ext:  2 plus pulses, no edema, no cyanosis, no clubbing Skin:  No rashes no nodules Neuro:  CN II through XII intact, motor grossly intact  EKG - atrial fib with biv pacing  DEVICE  Normal device function.  See PaceArt for details.   Assess/Plan: 1. VT/VF - her ventricular arrhythmias have remained quiet. SHe will continue amiodarone. 2. ICD - her St. Jude biv ICD is working normally. We will recheck in several months.  She has 2 years of battery longevity. 3. Atrial fib - her VR is well controlled.  4. Chronic systolic heart failure -her symptoms are class 2. She will continue GDMT as tolerated. She is not on an ACE/ARB due to hypotension and CRI.   Carleene Overlie Melana Hingle,MD

## 2021-01-15 ENCOUNTER — Other Ambulatory Visit: Payer: Self-pay | Admitting: Physician Assistant

## 2021-01-17 ENCOUNTER — Telehealth: Payer: Self-pay | Admitting: Internal Medicine

## 2021-01-17 MED ORDER — AMIODARONE HCL 200 MG PO TABS: 200.0000 mg | ORAL_TABLET | Freq: Every day | ORAL | 3 refills | Status: AC

## 2021-01-17 NOTE — Telephone Encounter (Signed)
*  STAT* If patient is at the pharmacy, call can be transferred to refill team.   1. Which medications need to be refilled? (please list name of each medication and dose if known) amiodarone (PACERONE) 200 MG tablet  2. Which pharmacy/location (including street and city if local pharmacy) is medication to be sent to? [phone] - [place], [address] AT [address]  3. Do they need a 30 day or 90 day supply? 90 day supply  PT is all out of this medication.She also states that the pharmacy is saying she needs a new script

## 2021-01-17 NOTE — Telephone Encounter (Signed)
Pt's son aware refill sent in as requested ./cy

## 2021-01-30 ENCOUNTER — Ambulatory Visit (INDEPENDENT_AMBULATORY_CARE_PROVIDER_SITE_OTHER): Payer: Medicare Other

## 2021-01-30 DIAGNOSIS — I472 Ventricular tachycardia, unspecified: Secondary | ICD-10-CM

## 2021-01-30 LAB — CUP PACEART REMOTE DEVICE CHECK
Battery Remaining Longevity: 24 mo
Battery Remaining Percentage: 28 %
Battery Voltage: 2.86 V
Date Time Interrogation Session: 20220607020026
HighPow Impedance: 40 Ohm
HighPow Impedance: 40 Ohm
Implantable Lead Implant Date: 20090618
Implantable Lead Implant Date: 20090618
Implantable Lead Location: 753858
Implantable Lead Location: 753860
Implantable Lead Model: 7120
Implantable Pulse Generator Implant Date: 20160111
Lead Channel Impedance Value: 280 Ohm
Lead Channel Impedance Value: 430 Ohm
Lead Channel Pacing Threshold Amplitude: 0.375 V
Lead Channel Pacing Threshold Amplitude: 0.5 V
Lead Channel Pacing Threshold Pulse Width: 0.5 ms
Lead Channel Pacing Threshold Pulse Width: 0.5 ms
Lead Channel Sensing Intrinsic Amplitude: 12 mV
Lead Channel Setting Pacing Amplitude: 2 V
Lead Channel Setting Pacing Amplitude: 2 V
Lead Channel Setting Pacing Pulse Width: 0.5 ms
Lead Channel Setting Pacing Pulse Width: 0.5 ms
Lead Channel Setting Sensing Sensitivity: 0.5 mV
Pulse Gen Serial Number: 7219979

## 2021-02-21 NOTE — Progress Notes (Signed)
Remote ICD transmission.   

## 2021-05-01 ENCOUNTER — Ambulatory Visit (INDEPENDENT_AMBULATORY_CARE_PROVIDER_SITE_OTHER): Payer: Medicare Other

## 2021-05-01 DIAGNOSIS — I428 Other cardiomyopathies: Secondary | ICD-10-CM

## 2021-05-01 LAB — CUP PACEART REMOTE DEVICE CHECK
Battery Remaining Longevity: 19 mo
Battery Remaining Percentage: 23 %
Battery Voltage: 2.8 V
Date Time Interrogation Session: 20220906020018
HighPow Impedance: 38 Ohm
HighPow Impedance: 38 Ohm
Implantable Lead Implant Date: 20090618
Implantable Lead Implant Date: 20090618
Implantable Lead Location: 753858
Implantable Lead Location: 753860
Implantable Lead Model: 7120
Implantable Pulse Generator Implant Date: 20160111
Lead Channel Impedance Value: 260 Ohm
Lead Channel Impedance Value: 410 Ohm
Lead Channel Pacing Threshold Amplitude: 0.5 V
Lead Channel Pacing Threshold Amplitude: 0.5 V
Lead Channel Pacing Threshold Pulse Width: 0.5 ms
Lead Channel Pacing Threshold Pulse Width: 0.5 ms
Lead Channel Sensing Intrinsic Amplitude: 11.8 mV
Lead Channel Setting Pacing Amplitude: 2 V
Lead Channel Setting Pacing Amplitude: 2 V
Lead Channel Setting Pacing Pulse Width: 0.5 ms
Lead Channel Setting Pacing Pulse Width: 0.5 ms
Lead Channel Setting Sensing Sensitivity: 0.5 mV
Pulse Gen Serial Number: 7219979

## 2021-05-10 NOTE — Progress Notes (Signed)
Remote ICD transmission.   

## 2021-07-31 ENCOUNTER — Ambulatory Visit (INDEPENDENT_AMBULATORY_CARE_PROVIDER_SITE_OTHER): Payer: Medicare Other

## 2021-07-31 DIAGNOSIS — I428 Other cardiomyopathies: Secondary | ICD-10-CM | POA: Diagnosis not present

## 2021-07-31 LAB — CUP PACEART REMOTE DEVICE CHECK
Battery Remaining Longevity: 22 mo
Battery Remaining Percentage: 25 %
Battery Voltage: 2.81 V
Date Time Interrogation Session: 20221206020017
HighPow Impedance: 37 Ohm
HighPow Impedance: 37 Ohm
Implantable Lead Implant Date: 20090618
Implantable Lead Implant Date: 20090618
Implantable Lead Location: 753858
Implantable Lead Location: 753860
Implantable Lead Model: 7120
Implantable Pulse Generator Implant Date: 20160111
Lead Channel Impedance Value: 280 Ohm
Lead Channel Impedance Value: 400 Ohm
Lead Channel Pacing Threshold Amplitude: 0.375 V
Lead Channel Pacing Threshold Amplitude: 0.5 V
Lead Channel Pacing Threshold Pulse Width: 0.5 ms
Lead Channel Pacing Threshold Pulse Width: 0.5 ms
Lead Channel Sensing Intrinsic Amplitude: 12 mV
Lead Channel Setting Pacing Amplitude: 2 V
Lead Channel Setting Pacing Amplitude: 2 V
Lead Channel Setting Pacing Pulse Width: 0.5 ms
Lead Channel Setting Pacing Pulse Width: 0.5 ms
Lead Channel Setting Sensing Sensitivity: 0.5 mV
Pulse Gen Serial Number: 7219979

## 2021-08-09 NOTE — Progress Notes (Signed)
Remote ICD transmission.   

## 2021-09-06 ENCOUNTER — Other Ambulatory Visit (HOSPITAL_COMMUNITY): Payer: Self-pay

## 2021-10-30 ENCOUNTER — Ambulatory Visit (INDEPENDENT_AMBULATORY_CARE_PROVIDER_SITE_OTHER): Payer: Medicare Other

## 2021-10-30 DIAGNOSIS — I428 Other cardiomyopathies: Secondary | ICD-10-CM | POA: Diagnosis not present

## 2021-10-30 LAB — CUP PACEART REMOTE DEVICE CHECK
Battery Remaining Longevity: 18 mo
Battery Remaining Percentage: 22 %
Battery Voltage: 2.78 V
Date Time Interrogation Session: 20230307020015
HighPow Impedance: 37 Ohm
HighPow Impedance: 37 Ohm
Implantable Lead Implant Date: 20090618
Implantable Lead Implant Date: 20090618
Implantable Lead Location: 753858
Implantable Lead Location: 753860
Implantable Lead Model: 7120
Implantable Pulse Generator Implant Date: 20160111
Lead Channel Impedance Value: 260 Ohm
Lead Channel Impedance Value: 400 Ohm
Lead Channel Pacing Threshold Amplitude: 0.375 V
Lead Channel Pacing Threshold Amplitude: 0.5 V
Lead Channel Pacing Threshold Pulse Width: 0.5 ms
Lead Channel Pacing Threshold Pulse Width: 0.5 ms
Lead Channel Sensing Intrinsic Amplitude: 12 mV
Lead Channel Setting Pacing Amplitude: 2 V
Lead Channel Setting Pacing Amplitude: 2 V
Lead Channel Setting Pacing Pulse Width: 0.5 ms
Lead Channel Setting Pacing Pulse Width: 0.5 ms
Lead Channel Setting Sensing Sensitivity: 0.5 mV
Pulse Gen Serial Number: 7219979

## 2021-11-02 ENCOUNTER — Other Ambulatory Visit (HOSPITAL_COMMUNITY): Payer: Self-pay

## 2021-11-12 NOTE — Progress Notes (Signed)
Remote ICD transmission.   

## 2021-12-27 NOTE — Progress Notes (Signed)
? ? ?Electrophysiology Office Note ?Date: 01/03/2022 ? ?ID:  Angela [name], DOB [birth year], MRN [id number] ? ?PCP: Lorene Dy, MD ?Primary Cardiologist: Cristopher Peru, MD ?Electrophysiologist: Dr. Lovena Le ? ?CC: Routine ICD follow-up ? ?Angela [name] is a 76 y.o. female seen today for Dr. Lovena Le for routine electrophysiology followup.   ? ?The patient has severe dementia and does not participate meaningfully in the conversation. Her son states that her pacemaker (2009) has eroded through the skin. He states that he initially noted it "2 weeks" ago.  Reports some drainage initially that has slowed. Denies fevers, chills, or overt sickness that he is aware of.  She does have a "very deep" pressure sore. She is not mobile independently.  She can drink and eat by herself, but requires assistance for any other task.  She does have hospice care involved to some extent at home per the son. Appetite is overall poor.  ? ?Device History: ?St. Jude BiV implanted 2009.   Left sided BIV change 2016 for chronic systolic CHF / VT/VF ?History of appropriate therapy: Yes ?History of AAD therapy: Yes; currently on amiodarone   ? ?Past Medical History:  ?Diagnosis Date  ? Abnormality of gait 08/23/2014  ? Arthritis   ? Atrial fibrillation ([place])   ? A.  Chronic Coumadin  ? Cardiac arrest - ventricular fibrillation   ? A.  12/2007;  B. 01/2008 St. Jude Promote Bi-V ICD placed  ? CVA (cerebral vascular accident) [facility])   ? Diverticular disease   ? DVT (deep venous thrombosis) ([place])   ? 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 A.  12/2001 iliofemoral embolus s/p r fem embolectomy  ? History of colon cancer   ? A.  1999 - T3, N1  chemotherapy  ? Internal hemorrhoids   ? Lower GI bleed 10/17/2020  ? Memory deficit 08/23/2014  ? Nonischemic cardiomyopathy ([place])   ? a.  11/2001 - Cath - NL Cors; b. ECHO 09/22/11: EF 20%, MVR normal, moderate LAE, mild RAE c. ECHO (08/2012): ED 20%, diff HK, LA mod dilated, mild/mod TR, RV mild/mod decreased  sys fx  ? Rectal bleeding 04/28/2017  ? Rheumatic heart disease   ? A. 1983 s/p  Bjork-Shiley MVR  ? Sick sinus syndrome ([place])   ? A.  s/p pacer in 1995.  B.    ? Systolic CHF, chronic ([place])   ? A.  01/2008 Echo - EF 10-20%  ? UTI (urinary tract infection) 01/19/2019  ? ?Past Surgical History:  ?Procedure Laterality Date  ? ABDOMINAL HYSTERECTOMY    ? AV NODE ABLATION N/A 09/23/2011  ? Procedure: AV NODE ABLATION;  Surgeon: Evans Lance, MD;  Location[facility] CATH LAB;  Service: Cardiovascular;  Laterality: N/A;  ? BIV ICD GENERTAOR CHANGE OUT N/A 09/05/2014  ? Procedure: BIV ICD GENERTAOR CHANGE OUT;  Surgeon: Evans Lance, MD;  Location: [facility] CATH LAB;  Service: Cardiovascular;  Laterality: N/A;  ? Biventricular AICD    ? COLON SURGERY  1999  ? EMBOLECTOMY Left 10/01/2019  ? Procedure: Left Popliteal Artery Thrombectomy with Vein Patch Angioplasty; Left Tibial Artery Thrombectomy;  Surgeon: Elam Dutch, MD;  Location: [place];  Service: Vascular;  Laterality: Left;  ? History of echocardiogram  2003, 2007, 2009  ? I & D EXTREMITY Left 10/03/2019  ? Procedure: EVACUATION OF HEMATOMA  LEFT LEG;  Surgeon: Elam Dutch, MD;  Location: [place];  Service: Vascular;  Laterality: Left;  ? INCISION AND DRAINAGE PERIRECTAL  ABSCESS Right 11/28/2018  ? Procedure: IRRIGATION AND DEBRIDEMENT PERIRECTAL ABSCESS;  Surgeon: Greer Pickerel, MD;  Location: [place];  Service: General;  Laterality: Right;  ? IR ANGIOGRAM VISCERAL SELECTIVE  10/12/2020  ? IR ANGIOGRAM VISCERAL SELECTIVE  10/12/2020  ? IR EMBO ART  VEN HEMORR LYMPH EXTRAV  INC GUIDE ROADMAPPING  10/12/2020  ? IR FLUORO GUIDE CV LINE RIGHT  10/12/2020  ? IR US GUIDE VASC ACCESS LEFT  10/12/2020  ? IR US GUIDE VASC ACCESS RIGHT  10/12/2020  ? Mitral Valve Replacement, Bjork-Shiley valve  1983  ? ? ?Current Outpatient Medications  ?Medication Sig Dispense Refill  ? alendronate (FOSAMAX) 70 MG tablet Take 70 mg by mouth once a week.    ? ALPRAZolam (XANAX) 0.25 MG tablet Take 0.25 mg by  mouth at bedtime as needed.    ? amiodarone (PACERONE) 200 MG tablet Take 1 tablet (200 mg total) by mouth daily. 90 tablet 3  ? carvedilol (COREG) 3.125 MG tablet Take 1 tablet (3.125 mg total) by mouth 2 (two) times daily with a meal. 180 tablet 3  ? digoxin (LANOXIN) 0.125 MG tablet Take 125 mcg by mouth daily.    ? furosemide (LASIX) 20 MG tablet Take 20 mg by mouth daily.    ? oxyCODONE (OXY IR/ROXICODONE) 5 MG immediate release tablet Take 1 tablet (5 mg total) by mouth every 6 (six) hours as needed for moderate pain. 20 tablet 0  ? pantoprazole (PROTONIX) 40 MG tablet TAKE 1 TABLET (40 MG TOTAL) BY MOUTH DAILY. 90 tablet 0  ? tiZANidine (ZANAFLEX) 4 MG tablet Take 4 mg by mouth daily as needed for muscle spasms.     ? warfarin (COUMADIN) 5 MG tablet Take 1 tablet (5 mg total) by mouth daily. 5 mg on Tu,W,F,Sa,Su    ? warfarin (COUMADIN) 6 MG tablet Take 1 tablet (6 mg total) by mouth daily. 6 mg Mon Thu    ? ?No current facility-administered medications for this visit.  ? ? ?Allergies:   Patient has no known allergies.  ? ?Social History: ?Social History  ? ?Socioeconomic History  ? Marital status: Married  ?  Spouse name: Not on file  ? Number of children: 1  ? Years of education: Not on file  ? Highest education level: Not on file  ?Occupational History  ? Occupation: Retired  ?  Employer: RETIRED  ?Tobacco Use  ? Smoking status: Never  ? Smokeless tobacco: Never  ?Vaping Use  ? Vaping Use: Never used  ?Substance and Sexual Activity  ? Alcohol use: No  ? Drug use: No  ? Sexual activity: Not Currently  ?Other Topics Concern  ? Not on file  ?Social History Narrative  ? The patient is married and has 1 child.  Lives in [place] with husband.  She is retired from working at [employer].  She does not smoke or drink  ? ?Social Determinants of Health  ? ?Financial Resource Strain: Not on file  ?Food Insecurity: Not on file  ?Transportation Needs: Not on file  ?Physical Activity: Not on file  ?Stress: Not on  file  ?Social Connections: Not on file  ?Intimate Partner Violence: Not on file  ? ? ?Family History: ?Family History  ?Problem Relation Age of Onset  ? Diabetes Mother 75  ? Coronary artery disease Mother   ?     s/p cabg  ? Lung cancer Father 19  ?     smoker  ? Coronary artery disease  Sister   ?     s/p cabg  ? ? ?Review of Systems: ?All other systems reviewed and are otherwise negative except as noted above. ? ? ?Physical Exam: ?Vitals:  ? 01/03/22 1143  ?BP: 130/70  ?Pulse: 72  ?SpO2: 98%  ?Height: '5\' 7"'$  (1.702 m)  ?  ? ?GEN- The patient is well appearing, alert and oriented x 3 today.   ?HEENT: normocephalic, atraumatic; sclera clear, conjunctiva pink; hearing intact; oropharynx clear; neck supple, no JVP ?Lymph- no cervical lymphadenopathy ?Lungs- Clear to ausculation bilaterally, normal work of breathing.  No wheezes, rales, rhonchi ?Heart- Regular rate and rhythm, no murmurs, rubs or gallops, PMI not laterally displaced ?GI- soft, non-tender, non-distended, bowel sounds present, no hepatosplenomegaly ?Extremities- no clubbing or cyanosis. No edema; DP/PT/radial pulses 2+ bilaterally ?MS- no significant deformity or atrophy ?Skin- warm and dry, no rash or lesion; ICD pocket well healed ?Psych- euthymic mood, full affect ?Neuro- strength and sensation are intact ? ?ICD interrogation- reviewed in detail today,  See PACEART report ? ?EKG:  EKG is not ordered today. ? ?Recent Labs: ?No results found for requested labs within last 8760 hours.  ? ?Wt Readings from Last 3 Encounters:  ?01/11/21 120 lb (54.4 kg)  ?10/26/20 127 lb 6.8 oz (57.8 kg)  ?09/06/20 134 lb (60.8 kg)  ?  ? ?Other studies Reviewed: ?Additional studies/ records that were reviewed today include: Previous EP office notes.  ? ? ? ? ?Assessment and Plan: ? ?Prior PPM site erosion ?Suspect has been gradual for sometime.  ?This original system was implanted in 2009. ?She heals very poorly, also has a deep pressure wound, and has hospice involved in  her chronic care ?Dr. Lovena Le has seen and will consider a partial extraction if allowed by Hospice.  ?She is NOT dependent today. She has escape in upper 30-40s with occasional PVCs. ? ?2.  Chronic systol

## 2021-12-27 NOTE — H&P (View-Only) (Signed)
Electrophysiology Office Note Date: 01/03/2022  ID:  Angela [name], DOB 1946/07/12, MRN [id number]  PCP: Lorene Dy, MD Primary Cardiologist: Cristopher Peru, MD Electrophysiologist: Dr. Lovena Le  CC: Routine ICD follow-up  Angela [name] is a 76 y.o. female seen today for Dr. Lovena Le for routine electrophysiology followup.    The patient has severe dementia and does not participate meaningfully in the conversation. Her son states that her pacemaker (2009) has eroded through the skin. He states that he initially noted it "2 weeks" ago.  Reports some drainage initially that has slowed. Denies fevers, chills, or overt sickness that he is aware of.  She does have a "very deep" pressure sore. She is not mobile independently.  She can drink and eat by herself, but requires assistance for any other task.  She does have hospice care involved to some extent at home per the son. Appetite is overall poor.   Device History: St. Jude BiV implanted 2009.   Left sided BIV change 2016 for chronic systolic CHF / VT/VF History of appropriate therapy: Yes History of AAD therapy: Yes; currently on amiodarone    Past Medical History:  Diagnosis Date   Abnormality of gait 08/23/2014   Arthritis    Atrial fibrillation ([place])    A.  Chronic Coumadin   Cardiac arrest - ventricular fibrillation    A.  12/2007;  B. 01/2008 St. 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  12/2001 iliofemoral embolus s/p r fem embolectomy   History of colon cancer    A.  1999 - T3, N1  chemotherapy   Internal hemorrhoids    Lower GI bleed 10/17/2020   Memory deficit 08/23/2014   Nonischemic cardiomyopathy ([place])    a.  11/2001 - Cath - NL Cors; b. ECHO 09/22/11: EF 20%, MVR normal, moderate LAE, mild RAE c. ECHO (08/2012): ED 20%, diff HK, LA mod dilated, mild/mod TR, RV mild/mod decreased  sys fx   Rectal bleeding 04/28/2017   Rheumatic heart disease    A. 1983 s/p  Bjork-Shiley MVR   Sick sinus syndrome ([place])    A.  s/p pacer in 1995.  B.     Systolic CHF, chronic ([place])    A.  01/2008 Echo - EF 10-20%   UTI (urinary tract infection) 01/19/2019   Past Surgical History:  Procedure Laterality Date   ABDOMINAL HYSTERECTOMY     AV NODE ABLATION N/A 09/23/2011   Procedure: AV NODE ABLATION;  Surgeon: Evans Lance, MD;  Location: [facility] CATH LAB;  Service: Cardiovascular;  Laterality: N/A;   BIV ICD GENERTAOR CHANGE OUT N/A 09/05/2014   Procedure: BIV ICD GENERTAOR CHANGE OUT;  Surgeon: Evans Lance, MD;  Location: [facility] CATH LAB;  Service: Cardiovascular;  Laterality: N/A;   Biventricular AICD     COLON SURGERY  1999   EMBOLECTOMY Left 10/01/2019   Procedure: Left Popliteal Artery Thrombectomy with Vein Patch Angioplasty; Left Tibial Artery Thrombectomy;  Surgeon: Elam Dutch, MD;  Location: [place];  Service: Vascular;  Laterality: Left;   History of echocardiogram  2003, 2007, 2009   I & D EXTREMITY Left 10/03/2019   Procedure: EVACUATION OF HEMATOMA  LEFT LEG;  Surgeon: Elam Dutch, MD;  Location: [place];  Service: Vascular;  Laterality: Left;   INCISION AND DRAINAGE PERIRECTAL  ABSCESS Right 11/28/2018   Procedure: IRRIGATION AND DEBRIDEMENT PERIRECTAL ABSCESS;  Surgeon: Greer Pickerel, MD;  Location: [place];  Service: General;  Laterality: Right;   IR ANGIOGRAM VISCERAL SELECTIVE  10/12/2020   IR ANGIOGRAM VISCERAL SELECTIVE  10/12/2020   IR EMBO ART  VEN HEMORR LYMPH EXTRAV  INC GUIDE ROADMAPPING  10/12/2020   IR FLUORO GUIDE CV LINE RIGHT  10/12/2020   IR US GUIDE VASC ACCESS LEFT  10/12/2020   IR US GUIDE VASC ACCESS RIGHT  10/12/2020   Mitral Valve Replacement, Bjork-Shiley valve  1983    Current Outpatient Medications  Medication Sig Dispense Refill   alendronate (FOSAMAX) 70 MG tablet Take 70 mg by mouth once a week.     ALPRAZolam (XANAX) 0.25 MG tablet Take 0.25 mg by  mouth at bedtime as needed.     amiodarone (PACERONE) 200 MG tablet Take 1 tablet (200 mg total) by mouth daily. 90 tablet 3   carvedilol (COREG) 3.125 MG tablet Take 1 tablet (3.125 mg total) by mouth 2 (two) times daily with a meal. 180 tablet 3   digoxin (LANOXIN) 0.125 MG tablet Take 125 mcg by mouth daily.     furosemide (LASIX) 20 MG tablet Take 20 mg by mouth daily.     oxyCODONE (OXY IR/ROXICODONE) 5 MG immediate release tablet Take 1 tablet (5 mg total) by mouth every 6 (six) hours as needed for moderate pain. 20 tablet 0   pantoprazole (PROTONIX) 40 MG tablet TAKE 1 TABLET (40 MG TOTAL) BY MOUTH DAILY. 90 tablet 0   tiZANidine (ZANAFLEX) 4 MG tablet Take 4 mg by mouth daily as needed for muscle spasms.      warfarin (COUMADIN) 5 MG tablet Take 1 tablet (5 mg total) by mouth daily. 5 mg on Tu,W,F,Sa,Su     warfarin (COUMADIN) 6 MG tablet Take 1 tablet (6 mg total) by mouth daily. 6 mg Mon Thu     No current facility-administered medications for this visit.    Allergies:   Patient has no known allergies.   Social History: Social History   Socioeconomic History   Marital status: Married    Spouse name: Not on file   Number of children: 1   Years of education: Not on file   Highest education level: Not on file  Occupational History   Occupation: Retired    Fish farm manager: RETIRED  Tobacco Use   Smoking status: Never   Smokeless tobacco: Never  Vaping Use   Vaping Use: Never used  Substance and Sexual Activity   Alcohol use: No   Drug use: No   Sexual activity: Not Currently  Other Topics Concern   Not on file  Social History Narrative   The patient is married and has 1 child.  Lives in [place] with husband.  She is retired from working at [employer]  She does not smoke or drink   Social Determinants of Health   Financial Resource Strain: Not on file  Food Insecurity: Not on file  Transportation Needs: Not on file  Physical Activity: Not on file  Stress: Not on  file  Social Connections: Not on file  Intimate Partner Violence: Not on file    Family History: Family History  Problem Relation Age of Onset   Diabetes Mother 7   Coronary artery disease Mother        s/p cabg   Lung cancer Father 81       smoker   Coronary artery disease  Sister        s/p cabg    Review of Systems: All other systems reviewed and are otherwise negative except as noted above.   Physical Exam: Vitals:   01/03/22 1143  BP: 130/70  Pulse: 72  SpO2: 98%  Height: '5\' 7"'$  (1.702 m)     GEN- The patient is well appearing, alert and oriented x 3 today.   HEENT: normocephalic, atraumatic; sclera clear, conjunctiva pink; hearing intact; oropharynx clear; neck supple, no JVP Lymph- no cervical lymphadenopathy Lungs- Clear to ausculation bilaterally, normal work of breathing.  No wheezes, rales, rhonchi Heart- Regular rate and rhythm, no murmurs, rubs or gallops, PMI not laterally displaced GI- soft, non-tender, non-distended, bowel sounds present, no hepatosplenomegaly Extremities- no clubbing or cyanosis. No edema; DP/PT/radial pulses 2+ bilaterally MS- no significant deformity or atrophy Skin- warm and dry, no rash or lesion; ICD pocket well healed Psych- euthymic mood, full affect Neuro- strength and sensation are intact  ICD interrogation- reviewed in detail today,  See PACEART report  EKG:  EKG is not ordered today.  Recent Labs: No results found for requested labs within last 8760 hours.   Wt Readings from Last 3 Encounters:  01/11/21 120 lb (54.4 kg)  10/26/20 127 lb 6.8 oz (57.8 kg)  09/06/20 134 lb (60.8 kg)     Other studies Reviewed: Additional studies/ records that were reviewed today include: Previous EP office notes.      Assessment and Plan:  Prior PPM site erosion Suspect has been gradual for sometime.  This original system was implanted in 2009. She heals very poorly, also has a deep pressure wound, and has hospice involved in  her chronic care Dr. Lovena Le has seen and will consider a partial extraction if allowed by Hospice.  She is NOT dependent today. She has escape in upper 30-40s with occasional PVCs.  2.  Chronic systolic dysfunction s/p St. Jude CRT-D  euvolemic today Stable on an appropriate medical regimen Normal ICD function See Pace Art report No changes today  3. VT/VF Quiescent on amiodarone Labs pending potential procedure.   4. Atrial fibrillation  Rates controlled  Current medicines are reviewed at length with the patient today.    Disposition:  Pending discussions with hospice.   Jacalyn Lefevre, PA-C  01/03/2022 11:57 AM  [facility] [address] [place] [address] [phone] (office) [phone] (fax)   EP Attending  Patient seen and examined.  She is well known to me over many years. She has a h/o CHB and sinus node dysfunction and VF arrest and is s/p BIV ICD insertion. She has had many VT/VF episodes over the year. Her initial PPM placed back in 1995 was not removed and she did well for years. She has developed a sacral decubitus and subsequent developed a hole in her right sided PM incision and present for evaluation . She had been on hospice care. She has lost weight. She denies fever or chills or night sweats. Her exam is noted above.   A/P PM pocket infection - she is not a candidate for extraction of both systems. However, I discussed the possibility of trying to salvace the situation by cutting the lead below the clavicle and pulling the lead and generaotr out through the open pocket. I reviewed this with the patient's son. They would like to proceed. Carleene Overlie Taylor,MD

## 2022-01-03 ENCOUNTER — Ambulatory Visit: Payer: Medicare Other | Admitting: Internal Medicine

## 2022-01-03 ENCOUNTER — Telehealth: Payer: Self-pay

## 2022-01-03 ENCOUNTER — Encounter: Payer: Self-pay | Admitting: Student

## 2022-01-03 VITALS — BP 130/70 | HR 72 | Ht 67.0 in

## 2022-01-03 DIAGNOSIS — T827XXA Infection and inflammatory reaction due to other cardiac and vascular devices, implants and grafts, initial encounter: Secondary | ICD-10-CM

## 2022-01-03 DIAGNOSIS — I480 Paroxysmal atrial fibrillation: Secondary | ICD-10-CM

## 2022-01-03 DIAGNOSIS — I472 Ventricular tachycardia, unspecified: Secondary | ICD-10-CM | POA: Diagnosis not present

## 2022-01-03 DIAGNOSIS — I428 Other cardiomyopathies: Secondary | ICD-10-CM | POA: Diagnosis not present

## 2022-01-03 DIAGNOSIS — Z95810 Presence of automatic (implantable) cardiac defibrillator: Secondary | ICD-10-CM

## 2022-01-03 DIAGNOSIS — I5042 Chronic combined systolic (congestive) and diastolic (congestive) heart failure: Secondary | ICD-10-CM | POA: Diagnosis not present

## 2022-01-03 LAB — CUP PACEART INCLINIC DEVICE CHECK
Date Time Interrogation Session: 20230511130215
Implantable Lead Implant Date: 20090618
Implantable Lead Implant Date: 20090618
Implantable Lead Location: 753858
Implantable Lead Location: 753860
Implantable Lead Model: 7120
Implantable Pulse Generator Implant Date: 20160111
Pulse Gen Serial Number: 7219979

## 2022-01-03 NOTE — Telephone Encounter (Signed)
Spoke with [employer]	[place].  Per Essie Christine, hospice will not cover procedure but her regular insurance will.  This procedure will not affect her hospice coverage. ? ?Discussed with Dr. Lovena Le. ? ?Will plan for pocket revision Jan 10, 2022 at 2:30 pm. ?

## 2022-01-03 NOTE — Telephone Encounter (Signed)
Spoke with son. ? ?He is not home, but when he gets home he will call this nurse back with phone number to hospice caring for Pt. ? ?Await call back. ?

## 2022-01-03 NOTE — Telephone Encounter (Signed)
Son called back. ? ?The Hospice RN is [place]. Her phone number is [phone] ?

## 2022-01-04 NOTE — Telephone Encounter (Signed)
I called pt's Son and went over these instructions with him. She will arrive at the hospital on 5/18 around 12-12:30 to have labs done prior to procedure. He is aware that she is to hold her Warfarin 2 days prior to procedure.  ?He was advised to call back or send a MyChart message with any questions or concerns prior to procedure.  ? ? ?Damian Leavell, RN  Louretta Shorten, [place]ino Liew, [place]; Shirley Friar, PA-C ?Hey EP team!  ? ?I am off tomorrow.  ? ?I scheduled this lady for pocket revision on Jan 10, 2022 at 2:30 pm.  ? ?I think you were with Jonni Sanger today, so you understand the situation.  ? ?Probably best thing to do would be to have her show up early and get labs on arrival?  To avoid another trip?  ? ?Can you please contact son and advise this is the plan?  ? ?They will need medication instructions.  I think GT would be fine with just holding warfarin for 2 days.  ? ?She could have breakfast before 8:00 am.  ? ?Jonni Sanger said he would put in lab orders.  I will send to precert right now.  ? ?Thanks guys!  ?Sonia Baller  ?

## 2022-01-07 NOTE — Telephone Encounter (Signed)
Work up complete. 

## 2022-01-10 ENCOUNTER — Encounter (HOSPITAL_COMMUNITY)
Admission: RE | Disposition: A | Discharge status: Home / Self Care | Payer: Self-pay | Source: Home / Self Care | Attending: Internal Medicine

## 2022-01-10 ENCOUNTER — Ambulatory Visit (HOSPITAL_COMMUNITY)
Admission: RE | Admit: 2022-01-10 | Discharge: 2022-01-10 | Disposition: A | Discharge status: Home / Self Care | Payer: Medicare Other | Attending: Internal Medicine | Admitting: Internal Medicine

## 2022-01-10 ENCOUNTER — Other Ambulatory Visit: Payer: Self-pay

## 2022-01-10 DIAGNOSIS — I495 Sick sinus syndrome: Secondary | ICD-10-CM | POA: Insufficient documentation

## 2022-01-10 DIAGNOSIS — Z950 Presence of cardiac pacemaker: Secondary | ICD-10-CM | POA: Diagnosis not present

## 2022-01-10 DIAGNOSIS — F0390 Unspecified dementia without behavioral disturbance: Secondary | ICD-10-CM | POA: Diagnosis not present

## 2022-01-10 DIAGNOSIS — I5022 Chronic systolic (congestive) heart failure: Secondary | ICD-10-CM | POA: Insufficient documentation

## 2022-01-10 DIAGNOSIS — I4891 Unspecified atrial fibrillation: Secondary | ICD-10-CM | POA: Diagnosis not present

## 2022-01-10 DIAGNOSIS — T827XXA Infection and inflammatory reaction due to other cardiac and vascular devices, implants and grafts, initial encounter: Secondary | ICD-10-CM | POA: Insufficient documentation

## 2022-01-10 DIAGNOSIS — Y831 Surgical operation with implant of artificial internal device as the cause of abnormal reaction of the patient, or of later complication, without mention of misadventure at the time of the procedure: Secondary | ICD-10-CM | POA: Insufficient documentation

## 2022-01-10 DIAGNOSIS — I472 Ventricular tachycardia, unspecified: Secondary | ICD-10-CM | POA: Insufficient documentation

## 2022-01-10 DIAGNOSIS — T82897S Other specified complication of cardiac prosthetic devices, implants and grafts, sequela: Secondary | ICD-10-CM

## 2022-01-10 HISTORY — PX: POCKET REVISION/RELOCATION: EP1222

## 2022-01-10 LAB — BASIC METABOLIC PANEL
Anion gap: 9 (ref 5–15)
BUN: 12 mg/dL (ref 8–23)
CO2: 23 mmol/L (ref 22–32)
Calcium: 8.7 mg/dL — ABNORMAL LOW (ref 8.9–10.3)
Chloride: 101 mmol/L (ref 98–111)
Creatinine, Ser: 0.77 mg/dL (ref 0.44–1.00)
GFR, Estimated: >60 mL/min (ref >60)
Glucose, Bld: 133 mg/dL — ABNORMAL HIGH (ref 70–99)
Potassium: 4.5 mmol/L (ref 3.5–5.1)
Sodium: 133 mmol/L — ABNORMAL LOW (ref 135–145)

## 2022-01-10 LAB — CBC
HCT: 33.2 % — ABNORMAL LOW (ref 36.0–46.0)
Hemoglobin: 10.2 g/dL — ABNORMAL LOW (ref 12.0–15.0)
MCH: 24.8 pg — ABNORMAL LOW (ref 26.0–34.0)
MCHC: 30.7 g/dL (ref 30.0–36.0)
MCV: 80.6 fL (ref 80.0–100.0)
Platelets: 289 10*3/uL (ref 150–400)
RBC: 4.12 MIL/uL (ref 3.87–5.11)
RDW: 17.3 % — ABNORMAL HIGH (ref 11.5–15.5)
WBC: 16.2 10*3/uL — ABNORMAL HIGH (ref 4.0–10.5)
nRBC: 0 % (ref 0.0–0.2)

## 2022-01-10 LAB — PROTIME-INR
INR: 1.4 — ABNORMAL HIGH (ref 0.8–1.2)
Prothrombin Time: 16.8 seconds — ABNORMAL HIGH (ref 11.4–15.2)

## 2022-01-10 SURGERY — POCKET REVISION/RELOCATION

## 2022-01-10 MED ORDER — SODIUM CHLORIDE 0.9 % IV SOLN
80.0000 mg | INTRAVENOUS | Status: AC
  Administered 2022-01-10: 80 mg

## 2022-01-10 MED ORDER — SODIUM CHLORIDE 0.9 % IV SOLN: INTRAVENOUS | Status: DC

## 2022-01-10 MED ORDER — ONDANSETRON HCL 4 MG/2ML IJ SOLN: 4.0000 mg | Freq: Four times a day (QID) | INTRAMUSCULAR | Status: DC | PRN

## 2022-01-10 MED ORDER — LIDOCAINE HCL (PF) 1 % IJ SOLN
INTRAMUSCULAR | Status: AC
Start: 2022-01-10 — End: ?
  Filled 2022-01-10: qty 60

## 2022-01-10 MED ORDER — LIDOCAINE HCL (PF) 1 % IJ SOLN
INTRAMUSCULAR | Status: DC | PRN
  Administered 2022-01-10: 60 mL

## 2022-01-10 MED ORDER — ACETAMINOPHEN 325 MG PO TABS: 325.0000 mg | ORAL_TABLET | ORAL | Status: DC | PRN

## 2022-01-10 MED ORDER — SODIUM CHLORIDE 0.9 % IV SOLN
INTRAVENOUS | Status: AC
  Filled 2022-01-10: qty 2

## 2022-01-10 MED ORDER — CHLORHEXIDINE GLUCONATE 4 % EX LIQD: 4.0000 "application " | Freq: Once | CUTANEOUS | Status: DC

## 2022-01-10 MED ORDER — CEFAZOLIN SODIUM-DEXTROSE 2-4 GM/100ML-% IV SOLN
INTRAVENOUS | Status: AC
  Filled 2022-01-10: qty 100

## 2022-01-10 MED ORDER — CEFAZOLIN SODIUM-DEXTROSE 2-4 GM/100ML-% IV SOLN
2.0000 g | INTRAVENOUS | Status: AC
  Administered 2022-01-10: 2 g via INTRAVENOUS

## 2022-01-10 SURGICAL SUPPLY — 4 items
CABLE SURGICAL S-101-97-12 (CABLE) ×2 IMPLANT
KIT WRENCH PACEMAKER ASSEM (MISCELLANEOUS) ×1 IMPLANT
PAD DEFIB RADIO PHYSIO CONN (PAD) ×2 IMPLANT
TRAY PACEMAKER INSERTION (PACKS) ×2 IMPLANT

## 2022-01-10 NOTE — Progress Notes (Signed)
Dr Lovena Le in to see pt-advised to have pt keep pressure dsg in place until follow up appt 5/22. Office will be in touch for appt-information to be given to son at d/c. Pt can restart coumadin tomorrow.

## 2022-01-10 NOTE — Interval H&P Note (Signed)
History and Physical Interval Note:  01/10/2022 12:03 PM  Angela [name]  has presented today for surgery, with the diagnosis of pacemaker pocket infection.  The various methods of treatment have been discussed with the patient and family. After consideration of risks, benefits and other options for treatment, the patient has consented to  Procedure(s): POCKET REVISION/RELOCATION (N/A) as a surgical intervention.  The patient's history has been reviewed, patient examined, no change in status, stable for surgery.  I have reviewed the patient's chart and labs.  Questions were answered to the patient's satisfaction.     Cristopher Peru

## 2022-01-10 NOTE — Discharge Instructions (Addendum)
Implantable Cardiac Device Battery Change, Care After  This sheet gives you information about how to care for yourself after your procedure. Your health care provider may also give you more specific instructions. If you have problems or questions, contact your health care provider. What can I expect after the procedure? After your procedure, it is common to have: Pain or soreness at the site where the cardiac device was inserted. Swelling at the site where the cardiac device was inserted. You should received an information card for your new device in 4-8 weeks. LEAVE DRESSING IN PLACE UNTIL FOLLOW UP APPT  Follow these instructions at home: Incision care  Keep the incision clean and dry. Do not take baths, swim, or use a hot tub until after your wound check.  Do not shower for at least 7 days, or as directed by your health care provider. Pat the area dry with a clean towel. Do not rub the area. This may cause bleeding. Follow instructions from your health care provider about how to take care of your incision. Make sure you: Leave stitches (sutures), skin glue, or adhesive strips in place. These skin closures may need to stay in place for 2 weeks or longer. If adhesive strip edges start to loosen and curl up, you may trim the loose edges. Do not remove adhesive strips completely unless your health care provider tells you to do that. Check your incision area every day for signs of infection. Check for: More redness, swelling, or pain. More fluid or blood. Warmth. Pus or a bad smell. Activity Do not lift anything that is heavier than 10 lb (4.5 kg) until your health care provider says it is okay to do so. For the first week, or as long as told by your health care provider: Avoid lifting your affected arm higher than your shoulder. After 1 week, Be gentle when you move your arms over your head. It is okay to raise your arm to comb your hair. Avoid strenuous exercise. Ask your health care  provider when it is okay to: Resume your normal activities. Return to work or school. Resume sexual activity. Eating and drinking Eat a heart-healthy diet. This should include plenty of fresh fruits and vegetables, whole grains, low-fat dairy products, and lean protein like chicken and fish. Limit alcohol intake to no more than 1 drink a day for non-pregnant women and 2 drinks a day for men. One drink equals 12 oz of beer, 5 oz of wine, or 1 oz of hard liquor. Check ingredients and nutrition facts on packaged foods and beverages. Avoid the following types of food: Food that is high in salt (sodium). Food that is high in saturated fat, like full-fat dairy or red meat. Food that is high in trans fat, like fried food. Food and drinks that are high in sugar. Lifestyle Do not use any products that contain nicotine or tobacco, such as cigarettes and e-cigarettes. If you need help quitting, ask your health care provider. Take steps to manage and control your weight. Once cleared, get regular exercise. Aim for 150 minutes of moderate-intensity exercise (such as walking or yoga) or 75 minutes of vigorous exercise (such as running or swimming) each week. Manage other health problems, such as diabetes or high blood pressure. Ask your health care provider how you can manage these conditions. General instructions Do not drive for 24 hours after your procedure if you were given a medicine to help you relax (sedative). Take over-the-counter and prescription medicines only as  told by your health care provider. Avoid putting pressure on the area where the cardiac device was placed. If you need an MRI after your cardiac device has been placed, be sure to tell the health care provider who orders the MRI that you have a cardiac device. Avoid close and prolonged exposure to electrical devices that have strong magnetic fields. These include: Cell phones. Avoid keeping them in a pocket near the cardiac device, and  try using the ear opposite the cardiac device. MP3 players. Household appliances, like microwaves. Metal detectors. Electric generators. High-tension wires. Keep all follow-up visits as directed by your health care provider. This is important. Contact a health care provider if: You have pain at the incision site that is not relieved by over-the-counter or prescription medicines. You have any of these around your incision site or coming from it: More redness, swelling, or pain. Fluid or blood. Warmth to the touch. Pus or a bad smell. You have a fever. You feel brief, occasional palpitations, light-headedness, or any symptoms that you think might be related to your heart. Get help right away if: You experience chest pain that is different from the pain at the cardiac device site. You develop a red streak that extends above or below the incision site. You experience shortness of breath. You have palpitations or an irregular heartbeat. You have light-headedness that does not go away quickly. You faint or have dizzy spells. Your pulse suddenly drops or increases rapidly and does not return to normal. You begin to gain weight and your legs and ankles swell. Summary After your procedure, it is common to have pain, soreness, and some swelling where the cardiac device was inserted. Make sure to keep your incision clean and dry. Follow instructions from your health care provider about how to take care of your incision. Check your incision every day for signs of infection, such as more pain or swelling, pus or a bad smell, warmth, or leaking fluid and blood. Avoid strenuous exercise and lifting your left arm higher than your shoulder for 2 weeks, or as long as told by your health care provider. This information is not intended to replace advice given to you by your health care provider. Make sure you discuss any questions you have with your health care provider.

## 2022-01-11 ENCOUNTER — Encounter (HOSPITAL_COMMUNITY): Payer: Self-pay | Admitting: Internal Medicine

## 2022-01-14 ENCOUNTER — Ambulatory Visit (INDEPENDENT_AMBULATORY_CARE_PROVIDER_SITE_OTHER): Payer: Medicare Other | Admitting: Student

## 2022-01-14 ENCOUNTER — Encounter: Payer: Self-pay | Admitting: Student

## 2022-01-14 VITALS — BP 102/68 | HR 78 | Ht 71.0 in | Wt 175.0 lb

## 2022-01-14 DIAGNOSIS — I480 Paroxysmal atrial fibrillation: Secondary | ICD-10-CM

## 2022-01-14 LAB — PROTIME-INR
INR: 1.2 (ref 0.9–1.2)
Prothrombin Time: 12.5 s — ABNORMAL HIGH (ref 9.1–12.0)

## 2022-01-14 NOTE — Patient Instructions (Addendum)
Medication Instructions:  Your physician recommends that you continue on your current medications as directed. Please refer to the Current Medication list given to you today.  *If you need a refill on your cardiac medications before your next appointment, please call your pharmacy*   Lab Work: TODAY: [facility]-Time INR If you have labs (blood work) drawn today and your tests are completely normal, you will receive your results only by: [place] (if you have MyChart) OR A paper copy in the mail If you have any lab test that is abnormal or we need to change your treatment, we will call you to review the results.   Follow-Up: At [facility], you and your health needs are our priority.  As part of our continuing mission to provide you with exceptional heart care, we have created designated Provider Care Teams.  These Care Teams include your primary Cardiologist (physician) and Advanced Practice Providers (APPs -  Physician Assistants and Nurse Practitioners) who all work together to provide you with the care you need, when you need it.  Your next appointment: 01/24/2022

## 2022-01-14 NOTE — Progress Notes (Signed)
Pressure bandage and outer bandage removed from primary wound  Steri-strips removed from small, upper incision under direction of Dr. Lovena Le.    Upper small incision with granulomatous tissue.   Primary incision with sutures in place and very slight sero-sanguinous drainage.  Two areas of small clotting agitated under Dr. Tanna Furry direction to stimulate healing.   Sutures left in tact.   Pt to continue to keep clean and dry and RTC in 7-10 days for re-assessment.   Plan to remove all sutures at that time if wound stable and dry per Dr. Lovena Le. Please contact him with any questions.   Dry guaze placed for ride home. Dr. Lovena Le instructed them to otherwise keep clean, dry, and uncovered at home to promote healing.   Will check INR with h/o mechanical valve.   Legrand Como [address] [place], PA-C  01/14/2022 10:23 AM

## 2022-01-24 ENCOUNTER — Ambulatory Visit (INDEPENDENT_AMBULATORY_CARE_PROVIDER_SITE_OTHER): Payer: Medicare Other

## 2022-01-24 DIAGNOSIS — I495 Sick sinus syndrome: Secondary | ICD-10-CM

## 2022-01-24 NOTE — Progress Notes (Signed)
Wound check appointment for suture removal s/p PPM removal and right sided pocket revision 01/10/22. Sutures removed without difficulty. Incision edges approximated and healing well. No s/s of infection. Family educated on wound care. Provided device clinic contact for additional questions or concerns that may arise.

## 2022-01-24 NOTE — Patient Instructions (Addendum)
   After Your Pacemaker Removal   Monitor your pacemaker site for redness, swelling, and drainage. Call the device clinic at [phone] if you experience these symptoms or fever/chills.  You may now gently wash your incision with soap and water daily, using a clean towel and washcloth with each shower. Do not apply any lotions, ointments, powders, or perfumes until your wound is completely healed.

## 2022-01-26 ENCOUNTER — Emergency Department (HOSPITAL_COMMUNITY)
Admission: EM | Admit: 2022-01-26 | Discharge: 2022-02-23 | Disposition: E | Payer: Medicare Other | Attending: Emergency Medicine | Admitting: Emergency Medicine

## 2022-01-26 DIAGNOSIS — I469 Cardiac arrest, cause unspecified: Secondary | ICD-10-CM | POA: Diagnosis present

## 2022-01-26 DIAGNOSIS — Z950 Presence of cardiac pacemaker: Secondary | ICD-10-CM | POA: Diagnosis not present

## 2022-01-26 DIAGNOSIS — F0390 Unspecified dementia without behavioral disturbance: Secondary | ICD-10-CM | POA: Insufficient documentation

## 2022-01-26 DIAGNOSIS — Z7901 Long term (current) use of anticoagulants: Secondary | ICD-10-CM | POA: Insufficient documentation

## 2022-01-26 DIAGNOSIS — I509 Heart failure, unspecified: Secondary | ICD-10-CM | POA: Diagnosis not present

## 2022-01-26 MED ORDER — EPINEPHRINE 1 MG/10ML IJ SOSY
PREFILLED_SYRINGE | INTRAMUSCULAR | Status: AC | PRN
  Administered 2022-01-26: 1 mg via INTRAVENOUS

## 2022-02-23 NOTE — ED Notes (Signed)
Time of Death

## 2022-02-23 NOTE — ED Notes (Signed)
Family stated they are going home.  Will  transport pt to morgue.

## 2022-02-23 NOTE — ED Notes (Signed)
Family at bedside. 

## 2022-02-23 NOTE — Progress Notes (Signed)
Chaplain responding to page regarding death of pt Angela [name]. Per RN, Angela [name] had died. Family present included her son, Angela [name], and his wife, Angela [name]. Other family came to visit later, including Angela [name] 2 sisters and brother-in-law. Family was tearful and also sharing about how she "had a good life" and "just had church members visit with her yesterday" after which she shared that she was "ready to go home." Family seems to be grieving the loss while also expressing relief that "she's at peace now." Chaplain provided prayer and compassionate presence.  Chaplain services remain available for follow-up spiritual/emotional support as needed.  [place], [place]      02/22/2022 1900  Clinical Encounter Type  Visited With Family  Visit Type Initial;Death;ED  Referral From Nurse  Spiritual Encounters  Spiritual Needs Grief support;Prayer

## 2022-02-23 NOTE — ED Notes (Addendum)
Pt arrived via EMS from home on lucas device.  Family reportedly had seen pt alert 15 min prior and then found the pt unresponsive.  EMS arrived and initiated CPR x 1 min.  Pt had a MOST form which indicated no heroic measures.  CPR stopped x 2 min and then restarted at the behest of family . CPR x 10-12 min and pt intubated on scene.  PEA noted.  X 7 min then lost pulses again.  CPR continued en route x 10-12 min.  Pt received 5 rounds of epi with EMS.  IO in the right. CBG 230, last epi 1651.  Pt did have a pressure of 60/30 at one point with EMS.  Family reports pt had her pacemaker replaced 2 weeks ago.  Family did emphasize with EMS not to place pt on a ventilator.

## 2022-02-23 NOTE — ED Notes (Signed)
Bedside US shows minimal cardiac squeeze.

## 2022-02-23 NOTE — ED Provider Notes (Signed)
[facility] EMERGENCY DEPARTMENT Provider Note   CSN: [id number] Arrival date & time: February 11, 2022  1701     History  No chief complaint on file.   Angela [name] is a 76 y.o. female with a history of severe dementia, atrial fibrillation on warfarin, sick sinus syndrome s/p pacemaker placement (with recent replacement), CHF, DVT, CVA presenting to the ED in cardiac arrest.  Per EMS, patient had last seen the family about 15 minutes prior, and when they went back to check on her they noted that she was lying in the bed not breathing.  On EMS arrival, patient was found to be pulseless and so they began CPR.  Patient was then found to have a MOST form, so CPR was discontinued, but patient's family stated they wanted to continue CPR.  Patient did have return of pulse after about 10 minutes of CPR, but lost pulses again on route.  She has not been receiving about 25 total minutes of CPR. Downtime has been >6mn. Family reports she was at her baseline this morning.  HPI     Home Medications Prior to Admission medications   Medication Sig Start Date End Date Taking? Authorizing Provider  alendronate (FOSAMAX) 70 MG tablet Take 70 mg by mouth every Sunday. 08/26/19   [provider]  ALPRAZolam (Duanne Moron 0.25 MG tablet Take 0.25 mg by mouth at bedtime as needed for sleep. 10/19/19   [provider]  amiodarone (PACERONE) 200 MG tablet Take 1 tablet (200 mg total) by mouth daily. 01/17/21   TEvans Lance MD  carvedilol (COREG) 3.125 MG tablet Take 1 tablet (3.125 mg total) by mouth 2 (two) times daily with a meal. 02/08/19   TEvans Lance MD  digoxin (LANOXIN) 0.125 MG tablet Take 125 mcg by mouth daily. 08/26/19   [provider]  furosemide (LASIX) 20 MG tablet Take 20 mg by mouth daily as needed for fluid. 07/20/19   [provider]  midodrine (PROAMATINE) 10 MG tablet Take 10 mg by mouth in the morning and at bedtime. 01/01/22   [provider]  Morphine Sulfate (MORPHINE CONCENTRATE) 10 mg / 0.5 ml concentrated solution Take 2.5 mg by mouth as needed for severe pain.    [provider]  oxyCODONE (OXY IR/ROXICODONE) 5 MG immediate release tablet Take 1 tablet (5 mg total) by mouth every 6 (six) hours as needed for moderate pain. 10/08/19   EDagoberto Ligas PA-C  tiZANidine (ZANAFLEX) 4 MG tablet Take 4 mg by mouth daily as needed for muscle spasms.  09/28/19   [provider]  warfarin (COUMADIN) 4 MG tablet Take 4 mg by mouth See admin instructions. Tues and Thurs    [provider]  warfarin (COUMADIN) 5 MG tablet Take 1 tablet (5 mg total) by mouth daily. 5 mg on Tu,W,F,Sa,Su Patient taking differently: Take 5 mg by mouth daily. SDorene Grebe Wed, Fri and Sat 10/29/20   [place] Eric J, DO      Allergies    Patient has no known allergies.    Review of Systems   Review of Systems  Unable to perform ROS: Acuity of condition   Physical Exam Updated Vital Signs There were no vitals taken for this visit. Physical Exam Constitutional:      Comments: Unresponsive. LUCAS device in place, CPR in progress.  Eyes:     Comments: Pupils are fixed and dilated bilaterally.  Cardiovascular:     Comments: Pulseless. Pulmonary:  Comments: Intubated. Bilateral breath sounds present. Abdominal:     Palpations: Abdomen is soft.  Musculoskeletal:        General: No deformity.  Skin:    Comments: Cool and dry.  Neurological:     Comments: GCS 3.    ED Results / Procedures / Treatments   Labs (all labs ordered are listed, but only abnormal results are displayed) Labs Reviewed - No data to display  EKG None  Radiology No results found.  Procedures Procedures    Medications Ordered in ED Medications  EPINEPHrine (ADRENALIN) 1 MG/10ML injection (1 mg Intravenous Given Feb 24, 2022 1654)    ED Course/ Medical Decision Making/ A&P                           Medical Decision  Making Risk Prescription drug management.   Angela [name] is a 76 y.o. female with a history of severe dementia, atrial fibrillation on warfarin, sick sinus syndrome s/p pacemaker placement (with recent replacement), CHF, DVT, CVA presenting to the ED in cardiac arrest.  On arrival, patient is actively receiving CPR via a Lucas device and is intubated.  Upon pausing of the device, patient found to be pulseless and in PEA.  1 mg of epinephrine given and CPR reinitiated.  Per EMS, patient did have a MOST form that stated she was DNR, however family had overridden this form on scene and had requested CPR.  On second pulse check, patient remains pulseless and in PEA.  Limited bedside echo was performed which showed very poor cardiac squeeze intermittently.  On family arrival, I spoke with them about the patient's current presentation and that her prolonged downtime would likely not result in a good neurologic outcome.  They did state that patient would not want to be on a ventilator, and I explained that if we continue CPR, she would certainly need to be on a ventilator.  After conversing with the family, they did request we discontinue CPR to allow the patient to pass peacefully.  Lucas device was stopped and patient remained pulseless and in PEA.  Time of death called at 81.        Final Clinical Impression(s) / ED Diagnoses Final diagnoses:  Cardiac arrest [facility])    Rx / DC Orders ED Discharge Orders     None         Sondra Come, MD 2022/02/24 9417    Sherwood Gambler, MD 01/27/22 [id number]

## 2022-02-23 DEATH — deceased
